# Patient Record
Sex: Female | Born: 1935
Health system: Southern US, Community
[De-identification: ages and names within clinical notes are randomized; demographics above are authoritative.]

## PROBLEM LIST (undated history)

## (undated) DIAGNOSIS — Z9289 Personal history of other medical treatment: Secondary | ICD-10-CM

## (undated) DIAGNOSIS — J449 Chronic obstructive pulmonary disease, unspecified: Secondary | ICD-10-CM

## (undated) DIAGNOSIS — R0602 Shortness of breath: Secondary | ICD-10-CM

## (undated) DIAGNOSIS — K819 Cholecystitis, unspecified: Secondary | ICD-10-CM

## (undated) DIAGNOSIS — I495 Sick sinus syndrome: Secondary | ICD-10-CM

## (undated) DIAGNOSIS — I639 Cerebral infarction, unspecified: Secondary | ICD-10-CM

## (undated) DIAGNOSIS — I509 Heart failure, unspecified: Secondary | ICD-10-CM

## (undated) DIAGNOSIS — F0390 Unspecified dementia without behavioral disturbance: Secondary | ICD-10-CM

## (undated) DIAGNOSIS — I4891 Unspecified atrial fibrillation: Secondary | ICD-10-CM

## (undated) DIAGNOSIS — R079 Chest pain, unspecified: Secondary | ICD-10-CM

## (undated) DIAGNOSIS — I10 Essential (primary) hypertension: Secondary | ICD-10-CM

## (undated) DIAGNOSIS — E876 Hypokalemia: Secondary | ICD-10-CM

## (undated) DIAGNOSIS — Z86711 Personal history of pulmonary embolism: Secondary | ICD-10-CM

## (undated) DIAGNOSIS — R7303 Prediabetes: Secondary | ICD-10-CM

## (undated) DIAGNOSIS — E785 Hyperlipidemia, unspecified: Secondary | ICD-10-CM

## (undated) DIAGNOSIS — Z8659 Personal history of other mental and behavioral disorders: Secondary | ICD-10-CM

## (undated) HISTORY — PX: ROTATOR CUFF REPAIR: SHX139

## (undated) HISTORY — PX: ABDOMINAL HYSTERECTOMY: SHX81

## (undated) HISTORY — PX: BACK SURGERY: SHX140

## (undated) HISTORY — DX: Personal history of other mental and behavioral disorders: Z86.59

## (undated) HISTORY — DX: Hyperlipidemia, unspecified: E78.5

## (undated) HISTORY — DX: Personal history of pulmonary embolism: Z86.711

## (undated) HISTORY — DX: Cerebral infarction, unspecified: I63.9

## (undated) HISTORY — DX: Personal history of other medical treatment: Z92.89

## (undated) HISTORY — DX: Unspecified atrial fibrillation: I48.91

## (undated) HISTORY — PX: REPLACEMENT TOTAL KNEE: SUR1224

## (undated) HISTORY — DX: Chest pain, unspecified: R07.9

## (undated) HISTORY — DX: Hypokalemia: E87.6

## (undated) HISTORY — DX: Heart failure, unspecified: I50.9

---

## 1998-04-16 ENCOUNTER — Observation Stay (HOSPITAL_COMMUNITY): Admission: RE | Admit: 1998-04-16 | Discharge: 1998-04-17 | Payer: Self-pay | Admitting: Orthopedic Surgery

## 1998-10-26 ENCOUNTER — Ambulatory Visit (HOSPITAL_COMMUNITY): Admission: RE | Admit: 1998-10-26 | Discharge: 1998-10-26 | Payer: Self-pay | Admitting: Endocrinology

## 1998-10-26 ENCOUNTER — Encounter: Payer: Self-pay | Admitting: Endocrinology

## 2000-01-31 ENCOUNTER — Encounter: Payer: Self-pay | Admitting: Endocrinology

## 2000-01-31 ENCOUNTER — Encounter: Admission: RE | Admit: 2000-01-31 | Discharge: 2000-01-31 | Payer: Self-pay | Admitting: Endocrinology

## 2001-02-01 ENCOUNTER — Encounter: Admission: RE | Admit: 2001-02-01 | Discharge: 2001-02-01 | Payer: Self-pay | Admitting: Endocrinology

## 2001-02-01 ENCOUNTER — Encounter: Payer: Self-pay | Admitting: Endocrinology

## 2002-02-03 ENCOUNTER — Encounter: Admission: RE | Admit: 2002-02-03 | Discharge: 2002-02-03 | Payer: Self-pay | Admitting: Endocrinology

## 2002-02-03 ENCOUNTER — Encounter: Payer: Self-pay | Admitting: Endocrinology

## 2002-05-24 ENCOUNTER — Other Ambulatory Visit: Admission: RE | Admit: 2002-05-24 | Discharge: 2002-05-24 | Payer: Self-pay | Admitting: Endocrinology

## 2003-01-11 ENCOUNTER — Encounter: Payer: Self-pay | Admitting: Neurosurgery

## 2003-01-11 ENCOUNTER — Encounter: Admission: RE | Admit: 2003-01-11 | Discharge: 2003-01-11 | Payer: Self-pay | Admitting: Neurosurgery

## 2003-01-26 ENCOUNTER — Encounter: Payer: Self-pay | Admitting: Neurosurgery

## 2003-01-26 ENCOUNTER — Encounter: Admission: RE | Admit: 2003-01-26 | Discharge: 2003-01-26 | Payer: Self-pay | Admitting: Neurosurgery

## 2003-02-07 ENCOUNTER — Encounter: Admission: RE | Admit: 2003-02-07 | Discharge: 2003-02-07 | Payer: Self-pay | Admitting: Endocrinology

## 2003-02-07 ENCOUNTER — Encounter: Payer: Self-pay | Admitting: Endocrinology

## 2003-08-08 ENCOUNTER — Encounter (HOSPITAL_COMMUNITY): Admission: RE | Admit: 2003-08-08 | Discharge: 2003-08-09 | Payer: Self-pay | Admitting: Endocrinology

## 2003-08-31 ENCOUNTER — Ambulatory Visit (HOSPITAL_COMMUNITY): Admission: RE | Admit: 2003-08-31 | Discharge: 2003-08-31 | Payer: Self-pay | Admitting: *Deleted

## 2003-09-28 ENCOUNTER — Ambulatory Visit (HOSPITAL_COMMUNITY): Admission: RE | Admit: 2003-09-28 | Discharge: 2003-09-28 | Payer: Self-pay | Admitting: *Deleted

## 2003-11-17 ENCOUNTER — Ambulatory Visit (HOSPITAL_COMMUNITY): Admission: RE | Admit: 2003-11-17 | Discharge: 2003-11-17 | Payer: Self-pay

## 2004-02-09 ENCOUNTER — Encounter: Admission: RE | Admit: 2004-02-09 | Discharge: 2004-02-09 | Payer: Self-pay | Admitting: Endocrinology

## 2004-09-17 ENCOUNTER — Encounter: Admission: RE | Admit: 2004-09-17 | Discharge: 2004-09-17 | Payer: Self-pay | Admitting: General Surgery

## 2004-09-19 ENCOUNTER — Ambulatory Visit (HOSPITAL_COMMUNITY): Admission: RE | Admit: 2004-09-19 | Discharge: 2004-09-19 | Payer: Self-pay | Admitting: General Surgery

## 2004-09-19 ENCOUNTER — Ambulatory Visit (HOSPITAL_BASED_OUTPATIENT_CLINIC_OR_DEPARTMENT_OTHER): Admission: RE | Admit: 2004-09-19 | Discharge: 2004-09-19 | Payer: Self-pay | Admitting: General Surgery

## 2005-02-20 ENCOUNTER — Encounter: Admission: RE | Admit: 2005-02-20 | Discharge: 2005-02-20 | Payer: Self-pay | Admitting: Endocrinology

## 2005-03-24 ENCOUNTER — Encounter: Admission: RE | Admit: 2005-03-24 | Discharge: 2005-03-24 | Payer: Self-pay

## 2005-11-27 ENCOUNTER — Encounter: Admission: RE | Admit: 2005-11-27 | Discharge: 2005-11-27 | Payer: Self-pay

## 2005-12-16 ENCOUNTER — Encounter: Admission: RE | Admit: 2005-12-16 | Discharge: 2005-12-16 | Payer: Self-pay

## 2006-01-09 ENCOUNTER — Encounter: Admission: RE | Admit: 2006-01-09 | Discharge: 2006-01-09 | Payer: Self-pay

## 2006-03-04 ENCOUNTER — Encounter: Admission: RE | Admit: 2006-03-04 | Discharge: 2006-03-04 | Payer: Self-pay | Admitting: Endocrinology

## 2007-01-29 ENCOUNTER — Emergency Department (HOSPITAL_COMMUNITY): Admission: EM | Admit: 2007-01-29 | Discharge: 2007-01-30 | Payer: Self-pay | Admitting: Emergency Medicine

## 2007-02-28 ENCOUNTER — Inpatient Hospital Stay (HOSPITAL_COMMUNITY): Admission: EM | Admit: 2007-02-28 | Discharge: 2007-03-02 | Payer: Self-pay | Admitting: Emergency Medicine

## 2007-03-08 ENCOUNTER — Encounter: Admission: RE | Admit: 2007-03-08 | Discharge: 2007-03-08 | Payer: Self-pay | Admitting: Endocrinology

## 2007-04-02 ENCOUNTER — Inpatient Hospital Stay (HOSPITAL_COMMUNITY): Admission: RE | Admit: 2007-04-02 | Discharge: 2007-04-06 | Payer: Self-pay | Admitting: Orthopaedic Surgery

## 2008-03-08 ENCOUNTER — Encounter: Admission: RE | Admit: 2008-03-08 | Discharge: 2008-03-08 | Payer: Self-pay | Admitting: Endocrinology

## 2008-07-02 ENCOUNTER — Emergency Department (HOSPITAL_COMMUNITY): Admission: EM | Admit: 2008-07-02 | Discharge: 2008-07-03 | Payer: Self-pay | Admitting: Emergency Medicine

## 2008-08-01 ENCOUNTER — Encounter (INDEPENDENT_AMBULATORY_CARE_PROVIDER_SITE_OTHER): Payer: Self-pay | Admitting: *Deleted

## 2008-08-01 ENCOUNTER — Ambulatory Visit (HOSPITAL_COMMUNITY): Admission: RE | Admit: 2008-08-01 | Discharge: 2008-08-01 | Payer: Self-pay | Admitting: *Deleted

## 2008-10-20 ENCOUNTER — Ambulatory Visit (HOSPITAL_COMMUNITY): Admission: RE | Admit: 2008-10-20 | Discharge: 2008-10-20 | Payer: Self-pay | Admitting: *Deleted

## 2008-10-20 ENCOUNTER — Encounter (INDEPENDENT_AMBULATORY_CARE_PROVIDER_SITE_OTHER): Payer: Self-pay | Admitting: *Deleted

## 2009-01-03 ENCOUNTER — Emergency Department (HOSPITAL_COMMUNITY): Admission: EM | Admit: 2009-01-03 | Discharge: 2009-01-03 | Payer: Self-pay | Admitting: Emergency Medicine

## 2010-11-11 ENCOUNTER — Other Ambulatory Visit: Payer: Self-pay | Admitting: Gastroenterology

## 2010-11-20 ENCOUNTER — Other Ambulatory Visit: Payer: Self-pay | Admitting: Gastroenterology

## 2010-11-20 DIAGNOSIS — D649 Anemia, unspecified: Secondary | ICD-10-CM

## 2010-11-28 ENCOUNTER — Other Ambulatory Visit: Payer: Self-pay

## 2010-12-13 ENCOUNTER — Ambulatory Visit
Admission: RE | Admit: 2010-12-13 | Discharge: 2010-12-13 | Disposition: A | Discharge status: Home / Self Care | Payer: MEDICARE | Source: Ambulatory Visit | Attending: Gastroenterology | Admitting: Gastroenterology

## 2010-12-13 DIAGNOSIS — D649 Anemia, unspecified: Secondary | ICD-10-CM

## 2011-02-04 NOTE — H&P (Signed)
NAMESORIAH, LEEMAN                    ACCOUNT NO.:  [id number]   MEDICAL RECORD NO.:  [id number]          PATIENT TYPE:  INP   LOCATION:  1830                         FACILITY:  [facility]   PHYSICIAN:  Ulyses Amor, MD DATE OF BIRTH:  04-29-1928   DATE OF ADMISSION:  02/28/2007  DATE OF DISCHARGE:                              HISTORY & PHYSICAL   Raven Gray is a 77 year old white woman who was admitted to [facility] for further evaluation of chest pain.   The patient, who has no past history of cardiac disease, presented to  the emergency department with a 2-day history of continuous chest pain.  The chest pain is described as a vague, mild tightness across her chest.  It radiates to the back of her neck.  It has been associated with  dyspnea, diaphoresis, and nausea. The chest discomfort appears to be  improved by deep inspiration; there were no other exacerbating or  ameliorating factors.  It appears not to be related to position,  activity, meals, or respiration.  It has subsided somewhat, though has  not resolved, since her arrival in the emergency department.   As noted, the patient has no past history of cardiac disease including  no history of chest pain, myocardial infarction, coronary artery  disease, congestive heart failure, or arrhythmias.  Her risk factors for  coronary artery disease include hypertension and family history.  She  has no history of diabetes mellitus, smoking, or dyslipidemia.   PAST MEDICAL HISTORY:  Notable otherwise only for depression.   MEDICATIONS:  Actonel, Effexor, and triamterene/hydrochlorothiazide.   ALLERGIES:  PREDNISONE.   OPERATIONS:  Neck, back, right shoulder.   SOCIAL HISTORY:  The patient lives with her husband. She does not work.  She does not smoke cigarettes. She does not drink alcohol.   FAMILY HISTORY:  Notable for coronary artery disease.   REVIEW OF SYSTEMS:  Reveals no problems related to her head, eyes, ears,  nose, mouth, throat, lungs, gastrointestinal system, genitourinary  system, extremities.  There is no history of neurologic or psychiatric  disorder.  There is no history of fever, chills, or weight loss.   PHYSICAL EXAMINATION:  VITAL SIGNS:  Blood pressure 150/73, pulse 69 and  regular, respirations 18, temperature 97.4.  GENERAL:  The patient is an elderly white woman in no discomfort. She  was alert, oriented, appropriate, and responsive.  HEAD, EYES, NOSE, AND MOUTH:  Normal.  NECK:  Without thyromegaly or adenopathy.  Carotid pulses were palpable  bilaterally and without bruits.  CARDIAC:  Examination revealed a normal S1 and S2. There was no S3, S4,  murmur, rub, or click.  Cardiac rhythm was regular.  CHEST:  Palpation of the sternum reproduced the patient's chest pain.  LUNGS:  Clear.  ABDOMEN:  Soft and nontender. There was no mass, hepatosplenomegaly,  bruit, distention, rebound, guarding, or rigidity.  Bowel sounds were  normal.  BREASTS, PELVIC, RECTAL:  Examinations were not performed as they were  not pertinent to the reason for acute care hospitalization.  EXTREMITIES:  Without edema,  deviation, or deformity.  Radial and  dorsalis pedis pulses were palpable bilaterally.  NEUROLOGIC:  Brief screening neurologic survey was unremarkable.   Electrocardiogram revealed normal sinus rhythm with a mildly prolonged  PR interval. There was slight ST segment depression in lead II, V4, and  V5.   The chest radiograph and chest CT, according to the radiologist, were  normal.  The initial set of cardiac markers revealed a myoglobin of  80.4, CK-MB less than 1.0, and troponin less than 0.05.  The second set  of cardiac markers revealed a myoglobin of 59.2, CK-MB less than 1.0,  and troponin less than 0.05.  Fibrin derivatives were 0.62.  Potassium  3.0, BUN 15, and creatinine 0.9.  White count was 5.7 with a hemoglobin  of 12.7 and hematocrit of 38.5.  The remaining studies were  pending at  the time of this dictation.   IMPRESSION:  1. Chest pain; rule out unstable angina.  The possibility of a      musculoskeletal etiology is suggested by reproduction of the chest      discomfort by palpation of the sternum.  Also noted is its      improvement by deep inspiration.  The chest radiograph and chest CT      are normal, and the SAO2 is 98% on room air.  2. Hypertension.  3. Depression.   PLAN:  1. Telemetry.  2. Serial cardiac enzymes.  3. Aspirin.  4. Intravenous heparin.  5. Intravenous nitroglycerin.  6. Fasting lipid profile.  7. Further measures per Dr. Jenne Campus.      Ulyses Amor, MD  Electronically Signed     MSC/MEDQ  D:  02/28/2007  T:  02/28/2007  Job:  [id number]   cc:   Darlin Priestly, MD

## 2011-02-04 NOTE — Op Note (Signed)
NAMEMAKENSEY, REGO                    ACCOUNT NO.:  [id number]   MEDICAL RECORD NO.:  [id number]          PATIENT TYPE:  AMB   LOCATION:  ENDO                         FACILITY:  [facility]   PHYSICIAN:  Georgiana Spinner, M.D.    DATE OF BIRTH:  1928-03-28   DATE OF PROCEDURE:  DATE OF DISCHARGE:                               OPERATIVE REPORT   PROCEDURE:  Upper endoscopy with biopsy.   INDICATIONS:  Abdominal pain.   ANESTHESIA:  Fentanyl 50 mcg, Versed 5 mg.   PROCEDURE:  With the patient mildly sedated in the left lateral  decubitus position, the Pentax videoscopic endoscope was inserted in the  mouth, passed under direct vision through the esophagus, which appeared  normal on first view as we entered into the stomach through a hiatal  hernia.  The fundus, body, antrum, duodenal bulb, second portion of the  duodenum were visualized.  From this point the endoscope was slowly  withdrawn, taking circumferential views of the duodenal mucosa until the  endoscope had been pulled back into the stomach and placed in  retroflexion to view the stomach from below and Barrett esophagus was  seen, photographed and biopsied.  The endoscope was straightened and  withdrawn, taking circumferential views in the remaining gastric and  esophageal mucosa, stopping in the fundus of the stomach, which appeared  to be in the hiatal hernia sac and areas of erythema possibly ulcer  based were photographed and biopsied.  The endoscope was withdrawn,  taking circumferential views of the remaining gastric and esophageal  mucosa.  The patient's vital signs, pulse oximeter remained stable.  The  patient tolerated the procedure well without apparent complications.   FINDINGS:  Question of linear ulcers in the stomach with surrounding  erythematous changes and question of Barrett  esophagus.  Await biopsy  reports.  The patient will call me for the results and follow up with me  as an outpatient.  Will increase PPI dose  to b.i.d.           ______________________________  Georgiana Spinner, M.D.     GMO/MEDQ  D:  08/01/2008  T:  08/01/2008  Job:  [id number]

## 2011-02-04 NOTE — Op Note (Signed)
NAMEBURNADETTE, Raven Gray                    ACCOUNT NO.:  [id number]   MEDICAL RECORD NO.:  [id number]          PATIENT TYPE:  INP   LOCATION:  5023                         FACILITY:  [facility]   PHYSICIAN:  Mark C. Ophelia Charter, M.D.    DATE OF BIRTH:  09-Jul-1928   DATE OF PROCEDURE:  04/02/2007  DATE OF DISCHARGE:                               OPERATIVE REPORT   PRE-AND-POSTOPERATIVE DIAGNOSIS:  Right knee osteoarthritis.   PROCEDURE:  Right total knee arthroplasty.   SURGEON:  Mark C. Ophelia Charter, M.D.   ASSISTANT:  Wende Neighbors, P.A.-C   ANESTHESIA:  GOT plus Marcaine local.   DESCRIPTION OF PROCEDURE:  After induction of general anesthesia  orotracheal intubation with preoperative femoral block placed by the  anesthesia team; standard DuraPrep was used up to the proximal thigh  tourniquet, impervious stockinette, Coban, and sterile skin marker, and  Betadine vidrape was used.  Leg was wrapped in an Esmarch prior to  tourniquet inflation.   Midline incision was made, patella was flipped over and cut removing 9.5  mm of bone with the oscillating saw.  Spurs were removed from the femur.  This was a valgus knee with flexion contracture valgus of 15 degrees and  a 12-degree flexion contracture.  Bicortical pins were placed in the  femur and the tibia; and did well until near the end of the case when  the femoral pins began to loosen despite their good bicortical  placement.  Initialization of tibial model followed by the femoral model  was made.   Femur was cut first taking 10 mm off of bone.  Sizing was 2.5 which was  appropriate.  Tibia was a size 3.  Chamfer cuts made on the femur, box  cut was made.  Distal remnants were excised.  There was laxity of the  medial side and medial collateral ligament was intact; however, it was  stretched out some from the long-term valgus position of the knee.  Posterior capsule was released off of the back of the femur; and large  bone spurs were removed off  the back to the femur with a 3/4-osteotome.  Initial cut on the tibia, taking 10 of bone was performed.  I then had  to be back down to take an initial 2 more mm to catch the scooped out  area, postero-lateral on the tibia where there was wear; this gave a  flush cut.  I was just catching a piece of the top of the fibula.   Carolin Guernsey was made; trials were inserted.  There was still some tightness  posteriorly.  There was good flexion/extension balance, slightly more  laxity medial from the long-term valgus; no lateral release was  necessary; but some more bone was removed off of the posterior aspect of  the femur, and stripping of the capsule which allowed full extension.  There was good symmetry on flexion/extension after irrigation with the  saline solution and pulsatile lavage.   Vacuum mixing of the cement, tibia was cemented first followed by femur,  and then the 10-mm spacer.  This was  a Geneticist, molecular.  All poly patella 35 mm was used.  After cement was hard at 15 minutes,  the tourniquet was deflated; hemostasis obtained; and then standard  layer closure.  Deep retinaculum with nonabsorbable Tycron; 2-0 in the  subcutaneous tissue, superficial retinaculum; then the skin closure with  Marcaine infiltration.  Postop dressing, knee immobilizer.  Instrument  count and needle count were correct.   SUMMARY:  Status  drains      Mark C. Ophelia Charter, M.D.  Electronically Signed     MCY/MEDQ  D:  04/02/2007  T:  04/03/2007  Job:  [id number]

## 2011-02-04 NOTE — Discharge Summary (Signed)
Raven Gray, Raven Gray                    ACCOUNT NO.:  [id number]   MEDICAL RECORD NO.:  [id number]          PATIENT TYPE:  INP   LOCATION:  3741                         FACILITY:  [facility]   PHYSICIAN:  Darlin Priestly, MD  DATE OF BIRTH:  1928-08-01   DATE OF ADMISSION:  02/28/2007  DATE OF DISCHARGE:  03/02/2007                               DISCHARGE SUMMARY   DISCHARGE DIAGNOSES:  1. Chest pain, negative for myocardial infarction.      a.     Negative pulmonary embolus.      b.     Negative aortic dissection.      c.     Normal coronary arteries was normal left ventricular       function.  2. Hypertension, controlled.  3. History of depression.  4. Hyperlipidemia.  Will treat with medication.  5. Large hiatal hernia on CT scan.  6. Hypokalemia, now improved with treatment.   DISCHARGE CONDITION:  Improved.   PROCEDURES:  March 01, 2007:  Combined left heart catheterization by Dr.  Nanetta Batty with normal coronary arteries, normal LV function.  Her  aortic root was generous but no aneurysm on CT of the chest.  Normal  renal arteries.  Lower extremity arteries are within normal limits as  well.   DISCHARGE MEDICATIONS:  1. Effexor 150 mg daily.  2. Triamterene/hydrochlorothiazide 37.5/25 daily.  3. Toprol-XL 25 mg daily.  4. Prilosec 20 mg one twice a day.  5. Actonel as before.   DISCHARGE INSTRUCTIONS:  1. May walk up steps.  May shower or bathe.  2. No lifting for 2 days.  3. No driving for 2 days.  4. Wash right groin catheterization site with soap and water.  Call if      any bleeding, swelling or drainage.  5. Follow up with Dr. Allyson Sabal Friday, March 19, 2007, at 10:45 a.m.   HISTORY OF PRESENT ILLNESS:  A 77 year old female who presented to the  emergency room at [facility] and was seen and evaluated by Dr. Gladys Damme  on-call for Dr. Jenne Campus and Dr. Allyson Sabal.  She had no previous cardiac  history.  She did have a remote Cardiolite that had been negative.  She  had chest  pain described as vague, mild tightness across her chest,  radiating to the back of her neck.  It had been associated with dyspnea,  diaphoresis and nausea.  It did improve with deep inspiration and it was  not related to position.   PAST MEDICAL HISTORY:  Positive for depression and hypertension.   OUTPATIENT MEDICATIONS:  As stated, Actonel, Effexor, triamterine.   ALLERGIES:  To PREDNISONE.   History of surgery on her neck, back and right shoulder.   FAMILY HISTORY, SOCIAL HISTORY, REVIEW OF SYSTEMS:  See H&P.   PHYSICAL EXAMINATION AT DISCHARGE:  VITAL SIGNS:  Blood pressure 113/70,  pulse 69, respiratory 20, temperature 97.  Oxygen saturation on room air  94%.  HEART:  Regular rate and rhythm.  LUNGS:  Clear and soft.  EXTREMITIES:  Groin stable.  No hematoma.  LABORATORY DATA:  Hemoglobin 12.7, hematocrit 38.5, WBC 5.7, platelets  273.  Slight decrease in hemoglobin to 11.5 and hematocrit 35.3, but  stable.  Neutrophils 63, lymphs 27, mono 8, eos 2, basos 1.  INR was  0.9, PTT 28.  D-dimer was 0.62 resulting in CT of chest which was  negative for PE, on heparin she was therapeutic.   Chemistry:  Sodium 138, potassium 3, chloride 101, CO2 28, glucose 101,  BUN 15, creatinine 0.84, total protein 6.1, albumin 3.3, AST 20, ALT 11,  ALP 65, total bili 0.6, magnesium 1.8.  Potassium at discharge was 3.6,  BUN 5, creatinine 0.80, and glucose 115, SGOT 20, SGPT 11.   CK 55 to 56, MB 1.7 and 1.8, troponin I 0.04 to 0.03, negative for MI.   Cholesterol 191, triglycerides 119, HDL 38 and LDL 129.   EKG:  Sinus rhythm, nonspecific ST changes when compared to previous  tracings.  CT of her chest as stated.  Chest x-ray negative for acute  cardiopulmonary process.   HOSPITAL COURSE:  The patient was admitted by Dr. Effie Shy on February 28, 2007, with chest pain.  She was placed on IV heparin, nitroglycerin.  Her potassium that was low was replaced.  She underwent cardiac   catheterization on March 01, 2007, with normal coronaries and normal LFT.  By March 02, 2007, she was stable, ambulating the hall without problem.  Her CT of her chest showed no aneurysm and no PE.  She does have a large  hiatal hernia.  Therefore, we are leaving her on Prilosec 20 mg twice a  day for now.  She will follow up with Dr. Juleen China as instructed, as well  as Dr. Allyson Sabal.      Darcella Gasman. Valarie Merino      Darlin Priestly, MD  Electronically Signed   LRI/MEDQ  D:  03/02/2007  T:  03/02/2007  Job:  [id number]   cc:   Nanetta Batty, M.D.  Brooke Bonito, M.D.

## 2011-02-04 NOTE — Op Note (Signed)
NAMEANAISSA, Raven Gray                    ACCOUNT NO.:  [id number]   MEDICAL RECORD NO.:  [id number]          PATIENT TYPE:  AMB   LOCATION:  ENDO                         FACILITY:  [facility]   PHYSICIAN:  Georgiana Spinner, M.D.    DATE OF BIRTH:  10-09-1927   DATE OF PROCEDURE:  10/20/2008  DATE OF DISCHARGE:                               OPERATIVE REPORT   PROCEDURE:  Upper endoscopy with biopsy.   INDICATIONS:  Stomach ulcers, abdominal pain.   ANESTHESIA:  Fentanyl 50 mcg, Versed 4 mg.   DESCRIPTION OF PROCEDURE:  With the patient mildly sedated in the left  lateral decubitus position, the Pentax videoscopic endoscope was  inserted in the mouth, passed under direct vision through the esophagus  which appeared normal into what appeared to be a pouch of the stomach,  possibly a hernia, but we were able to advance this superiorly and get  into the body and fundus of the stomach and advance to the antrum which  appeared mildly erythematous which was photographed and biopsied.  Duodenal bulb and second portion of duodenum were visualized and  appeared normal.  From this point the endoscope was slowly withdrawn  taking circumferential views of duodenal mucosa until the endoscope had  been pulled back into stomach, placed in retroflexion to view the  stomach from below.  The endoscope was straightened and withdrawn taking  circumferential views of remaining gastric and esophageal mucosa.  The  patient's vital signs and pulse oximeter remained stable.  The patient  tolerated the procedure well without apparent complication.   FINDINGS:  1. Loose wrap of the gastroesophageal junction around the endoscope      indicating laxity of the lower esophageal sphincter.  2. Erythema of antrum biopsied.  Await biopsy report.  The patient      will call me for results and follow-up with me as an outpatient.           ______________________________  Georgiana Spinner, M.D.     GMO/MEDQ  D:  10/20/2008  T:   10/20/2008  Job:  [id number]

## 2011-02-04 NOTE — Cardiovascular Report (Signed)
Raven Gray, Raven Gray                    ACCOUNT NO.:  [id number]   MEDICAL RECORD NO.:  [id number]          PATIENT TYPE:  INP   LOCATION:  3741                         FACILITY:  [facility]   PHYSICIAN:  Nanetta Batty, M.D.   DATE OF BIRTH:  1928/07/13   DATE OF PROCEDURE:  03/01/2007  DATE OF DISCHARGE:                            CARDIAC CATHETERIZATION   Raven Gray is a delightful 77 year old female whose husband is a patient of  Dr. Lenise Herald.  She was admitted last night with unstable angina.  She ruled out for myocardial infarction.  She presents now for  diagnostic coronary arteriography to define her anatomy and rule out an  ischemic etiology.   PROCEDURE DESCRIPTION:  The patient was brought to the second floor  [place] cardiac cath lab in the postabsorptive state.  She was  premedicated with p.o. Valium.  The right groin was prepped and shaved  in the usual sterile fashion.  One percent Xylocaine was used for local  anesthesia.  A 6-French sheath was inserted into the right femoral  artery using the standard Seldinger technique.  6-French right and left  Judkins diagnostic catheters, as well as a pigtail catheter, were used  for selective cholangiography, left ventriculography, supravalvular  aortography and distal abdominal aortography.  Visipaque dye was used  throughout the entirety of the case.  Aortic, ventricular and pulmonary  pressures were recorded.   HEMODYNAMICS:  1. Aortic systolic pressure 212, diastolic pressure 97.  2. Left ventricular systolic pressure 202, end-diastolic pressure 15.   SELECTIVE CORONARY ANGIOGRAPHY:  1. Left main normal.  2. LAD normal.  3. Left circumflex was dominant normal.  4. Right coronary artery was small, nondominant, normal.   LEFT VENTRICULOGRAPHY:  RAO left ventriculogram was performed using 25  cc of Visipaque dye at 12 cc per second.  The overall LVEF was estimated  at greater than 50% without focal wall motion  abnormalities.   SUPRAVALVULAR AORTOGRAPHY:  Performed in the LAO view using 25 cc of  Visipaque dye at 20 cc per second.  The aortic root seemed generous in  caliber.  There was no dissection or AI noted.   DISTAL ABDOMINAL AORTOGRAPHY:  Distal abdominal aortogram was performed  using of 25 cc of Visipaque dye at 20 cc per second.  The renal arteries  appeared widely patent.  The infrarenal abdominal aorta and iliac  bifurcation appear free of atherosclerotic changes.   IMPRESSION:  Raven Gray has essentially normal coronary arteries with a  left dominant system and normal left ventricular function.  I am not  sure why her troponin went up to 0.62.  Aortic root seems somewhat  generous and I would recommend a CT scan with contrast to assess its  size.  The renal arteries are normal, suggesting her hypertension is  essential.  Continued medical therapy will be recommended.   ACT was measured and the sheath was removed.  Pressure was applied to  the groin to achieve hemostasis.  The patient left the lab in stable  condition.      Nanetta Batty,  M.D.  Electronically Signed     JB/MEDQ  D:  03/01/2007  T:  03/02/2007  Job:  [id number]   cc:   2nd Floor [place] [facility]  [facility]

## 2011-02-07 NOTE — Op Note (Signed)
Raven Gray, SCRIVENS                              ACCOUNT NO.:  [id number]   MEDICAL RECORD NO.:  [id number]                   PATIENT TYPE:  AMB   LOCATION:  ENDO                                 FACILITY:  [facility]   PHYSICIAN:  Georgiana Spinner, M.D.                 DATE OF BIRTH:  [birth year]   DATE OF PROCEDURE:  DATE OF DISCHARGE:                                 OPERATIVE REPORT   PROCEDURE:  Colonoscopy.   INDICATIONS:  Hemoccult positivity.   ANESTHESIA:  Demerol 30, Versed 3 mg.   PROCEDURE:  With the patient mildly sedated, in the left lateral decubitus  position the Olympus videoscopic colonoscope was inserted in the rectum,  passed under direct vision to the cecum, identified by ileocecal valve and  appendiceal orifice, both of which were photographed.  From this point the  colonoscope was slowly withdrawn taking circumferential views of the colonic  mucosa, stopping only in the rectum, which appeared normal on direct, showed  hemorrhoids on retroflexed view.  The endoscope was straightened, withdrawn.  The patient's vital signs, pulse oximeter remained stable.  The patient  tolerated the procedure well, without apparent complications.   FINDINGS:  Internal hemorrhoids, otherwise unremarkable colonoscopic  examination to the cecum.   PLAN:  Have the patient follow up with me as an outpatient.                                               Georgiana Spinner, M.D.    GMO/MEDQ  D:  08/31/2003  T:  08/31/2003  Job:  [id number]

## 2011-02-07 NOTE — Discharge Summary (Signed)
Raven Gray, Raven Gray                    ACCOUNT NO.:  [id number]   MEDICAL RECORD NO.:  [id number]          PATIENT TYPE:  INP   LOCATION:  5023                         FACILITY:  [facility]   PHYSICIAN:  Mark C. Ophelia Charter, M.D.    DATE OF BIRTH:  October 22, 1927   DATE OF ADMISSION:  04/02/2007  DATE OF DISCHARGE:  04/06/2007                               DISCHARGE SUMMARY   ADMISSION DIAGNOSES:  1. Right knee osteoarthritis.  2. Rheumatoid arthritis.  3. Glaucoma.  4. Hypertension.  5. Osteoporosis.  6. Anxiety and depression  7. Status post lumbar decompression 2007.  8. Status post right rotator cuff repair 2004.   DISCHARGE DIAGNOSES:  1. Right knee osteoarthritis.  2. Rheumatoid arthritis.  3. Glaucoma.  4. Hypertension.  5. Osteoporosis.  6. Anxiety and depression  7. Status post lumbar decompression 2007.  8. Status post right rotator cuff repair 2004.  9. Posthemorrhagic anemia.  10.Hypokalemia treated with oral supplementation and resolved.   PROCEDURE:  On April 02, 2007, the patient underwent right total knee  arthroplasty by Dr. Annell Greening under general anesthesia, assisted by  Maud Deed, PA-C.   CONSULTATIONS:  None.   BRIEF HISTORY:  The patient is a 77 year old female with chronic and  progressive right knee pain secondary to osteoarthritis.  She has had  conservative treatment with intra-articular steroid injections as well  as viscous supplementation.  She uses chronic narcotic pain medication.  Radiographs have shown end-stage osteoarthritis of the right knee with a  25-degree valgus deformity of the right knee.  It was felt she would  benefit from surgical intervention and was admitted for the procedure as  stated above.   BRIEF HOSPITAL COURSE:  The patient tolerated the procedure under  general anesthesia without complications.  Postoperatively,  neurovascular function of the lower extremities was noted to be intact.  Dressing changes were done daily and the  patient's wound was healing  well during the hospital stay.  The patient was started on the usual  physical therapy program for ambulation and gait training, range of  motion and stretching exercises.  CPM was utilized for passive range of  motion.  The patient advanced very quickly with physical therapy.  She  received occupational therapy for ADLs and tolerated this well also.  At  the time of discharge the patient was ambulating 200 feet.  Range of  motion of the knee was noted to be 90 degrees in seated position with  full extension.  The patient was started on Coumadin for DVT  prophylaxis.  Adjustments in Coumadin dose were made according to daily  pro times by the pharmacist.  The patient was taking a regular diet.  She was voiding and having bowel movements prior to discharge.  On April 06, 2007, she was discharged to her home in stable condition with  arrangements for home health physical therapy through [facility].   PERTINENT LABORATORY VALUES:  EKG on admission:  Sinus rhythm with first-  degree AV block and nonspecific ST abnormality, with no change when  compared to previous EKG of December 2005.  CBC on admission with  hemoglobin 12.2, hematocrit 37.7.  Prior to discharge, hemoglobin 10.5,  hematocrit 31.4.  INR at discharge is 2.2.  Chemistry studies on  admission with potassium 3.4.  Oral supplementation was given.  On July  13, potassium 3.4.  Urinalysis on admission with small leukocyte  esterase, few epithelial cells, 36 wbc's, and 0-2 rbc's.   PLAN:  The patient was discharged to her home.  Arrangements for home  health physical therapy for ambulation and gait training, weightbearing  as tolerated utilizing a walker, range of motion, stretching and  strengthening exercises of the lower extremities.  The patient will  change her dressing as needed.  She will be allowed to shower.  She will  follow up with Dr. Ophelia Charter in 7-10 days.   PRESCRIPTIONS AT  DISCHARGE:  1. Tylox one to two every 4-6 hours as needed for pain.  2. Iron supplementation daily for 3 weeks  3. Coumadin 1 mg daily.  4. She will resume medications as taken prior to admission.   All questions encouraged and answered.      Wende Neighbors, P.A.      Mark C. Ophelia Charter, M.D.  Electronically Signed    SMV/MEDQ  D:  05/07/2007  T:  05/08/2007  Job:  [id number]

## 2011-02-07 NOTE — Op Note (Signed)
NAMEHILARY, Raven Gray                    ACCOUNT NO.:  [id number]   MEDICAL RECORD NO.:  [id number]          PATIENT TYPE:  AMB   LOCATION:  DSC                          FACILITY:  [facility]   PHYSICIAN:  Gabrielle Dare. Janee Morn, M.D.DATE OF BIRTH:  1928/03/03   DATE OF PROCEDURE:  09/19/2004  DATE OF DISCHARGE:                                 OPERATIVE REPORT   REFERRING PHYSICIAN:  Brooke Bonito, M.D.   PREOPERATIVE DIAGNOSIS:  Left inguinal hernia.   POSTOPERATIVE DIAGNOSIS:  Left inguinal hernia.   PROCEDURE:  Repair of left inguinal hernia with mesh.   SURGEON:  Gabrielle Dare. Janee Morn, M.D.   ANESTHESIA:  General/LMA.   ESTIMATED BLOOD LOSS:  Minimal.   HISTORY OF PRESENT ILLNESS:  The patient is a 77 year old white female who  was worked up for right groin pain with some flank pain as well.  CT scan  revealed kidney stones and also a left inguinal hernia.  She was treated for  her kidney stones and continued to have pain in her left inguinal region and  now presents for left inguinal hernia repair.   DESCRIPTION OF PROCEDURE:  Informed consent was obtained.  The patient  received intravenous antibiotics.  She was brought to the operating room and  general/LMA anesthesia was administered.  Her abdomen and left groin were  prepped and draped in a sterile fashion.  A left groin incision was made.  Subcutaneous tissues were dissected down through Scarpa's fascia revealing  the external oblique.  External oblique was a bit attenuated here with an  obvious bulge from the hernia.  The external oblique was opened while  protecting the hernia beneath and this opening was continued down through  the external ring.  The superior leaflet of the external oblique was bluntly  dissected from the transversalis and the inferior oblique was bluntly freed  up from the hernia revealing the shelving edge of the inguinal ligament.  Subsequently, the hernia was exposed.  This was circumferentially dissected  to  facilitate reducing it back into the abdomen.  It was a lateral direct  hernia.  Once it was mobilized, it easily reduced back into the abdomen.  Subsequently the ligament structures analogous to the cord were divided.  Good hemostasis was obtained.  The hernia was then repaired using a  polypropylene mesh cut into a bullet shape.  This was sutured to the tissues  above the pubic tubercle medially and in a running fashion along the  shelving edge of the inguinal ligament.  It was then tacked down to the  aponeurosis superiorly first starting at the permanent tissues over the  pubic tubercle and then along the aponeurosis in interrupted fashion with 0  Prolene completing a nice repair of the hernia.  Some additional stitches  were placed more laterally with 0 Prolene down to the fascia to secure the  mesh well.  Once this was accomplished, the area was copiously irrigated.  Hemostasis was insured.  Some 0.25% Marcaine with epinephrine was injected  into the fascia and subcutaneous tissues in the subcuticular area.  The  external oblique was closed with running 3-0 Vicryl stitch.  Subcutaneous  tissues were again irrigated. Scarpa's fascia was reapproximated with a  series of interrupted 3-0 Vicryl sutures and the skin was closed with a  running 4-0 Monocryl  subcuticular stitch.  Sponge, needle and instrument counts were correct.  Benzoin, Steri-Strips and sterile dressing were applied.  The patient  tolerated the procedure well without apparent complications and was taken to  the recovery room in stable condition.       BET/MEDQ  D:  09/19/2004  T:  09/19/2004  Job:  [id number]   cc:   Brooke Bonito, M.D.  [address]  [place]  [place] [address]  Fax: [phone]

## 2011-02-07 NOTE — Op Note (Signed)
NAMEGORDIE, Raven Gray                              ACCOUNT NO.:  [id number]   MEDICAL RECORD NO.:  [id number]                   PATIENT TYPE:  AMB   LOCATION:  ENDO                                 FACILITY:  [facility]   PHYSICIAN:  Georgiana Spinner, M.D.                 DATE OF BIRTH:  [birth year]   DATE OF PROCEDURE:  08/31/2003  DATE OF DISCHARGE:                                 OPERATIVE REPORT   PROCEDURE:  Upper endoscopy.   INDICATIONS:  Hemoccult positivity.   ANESTHESIA:  1. Demerol 70.  2. Versed 7 mg.   PROCEDURE:  With patient mildly sedated in the left lateral decubitus  position, the Olympus videoscopic endoscope was inserted into the mouth,  passed under direct vision through the esophagus, which appeared normal,  into the stomach.  The fundus, body, antrum, duodenal bulb, second portion  of duodenum were entered and all appeared normal.  From this point the  endoscope was slowly withdrawn, taking circumferential views of duodenal  mucosa until the endoscope had been pulled back into the stomach.  Placed in  retroflexion and viewed the stomach from below.  The endoscope was  straightened and withdrawn, taking circumferential views of remaining  gastric and esophageal mucosa.  Patient's vital signs and pulse oximetry  remained stable.  Patient tolerated the procedure well with no apparent  complications.   FINDINGS:  Unremarkable examination.   PLAN:  Proceed to colonoscopy.                                               Georgiana Spinner, M.D.    GMO/MEDQ  D:  08/31/2003  T:  08/31/2003  Job:  [id number]

## 2011-03-14 ENCOUNTER — Other Ambulatory Visit: Payer: Self-pay | Admitting: Endocrinology

## 2011-04-25 ENCOUNTER — Other Ambulatory Visit: Payer: Self-pay | Admitting: Endocrinology

## 2011-04-25 DIAGNOSIS — Z1231 Encounter for screening mammogram for malignant neoplasm of breast: Secondary | ICD-10-CM

## 2011-05-20 ENCOUNTER — Ambulatory Visit: Payer: Medicare Other

## 2011-05-21 ENCOUNTER — Ambulatory Visit
Admission: RE | Admit: 2011-05-21 | Discharge: 2011-05-21 | Disposition: A | Discharge status: Home / Self Care | Payer: Medicare Other | Source: Ambulatory Visit | Attending: Endocrinology | Admitting: Endocrinology

## 2011-05-21 DIAGNOSIS — Z1231 Encounter for screening mammogram for malignant neoplasm of breast: Secondary | ICD-10-CM

## 2011-06-23 LAB — POCT I-STAT, CHEM 8
Creatinine, Ser: 1
Hemoglobin: 14.3
Potassium: 3.1 — ABNORMAL LOW
Sodium: 140

## 2011-06-23 LAB — COMPREHENSIVE METABOLIC PANEL
BUN: 13
CO2: 29
Calcium: 9.2
Creatinine, Ser: 0.85
GFR calc Af Amer: >60
GFR calc non Af Amer: >60
Glucose, Bld: 134 — ABNORMAL HIGH
Total Bilirubin: 0.6

## 2011-06-23 LAB — CBC
HCT: 40.9
Hemoglobin: 13.2
MCHC: 32.2
MCV: 85.5
RBC: 4.78

## 2011-06-23 LAB — URINALYSIS, ROUTINE W REFLEX MICROSCOPIC
Bilirubin Urine: NEGATIVE
Nitrite: NEGATIVE
Protein, ur: NEGATIVE
Specific Gravity, Urine: 1.017
Urobilinogen, UA: 1

## 2011-06-23 LAB — DIFFERENTIAL
Basophils Absolute: 0
Lymphocytes Relative: 15
Monocytes Absolute: 0.4
Monocytes Relative: 6
Neutro Abs: 5.6
Neutrophils Relative %: 78 — ABNORMAL HIGH

## 2011-06-23 LAB — LACTIC ACID, PLASMA: Lactic Acid, Venous: 1.6

## 2011-06-23 LAB — URINE MICROSCOPIC-ADD ON

## 2011-07-07 LAB — PROTIME-INR
INR: 2.2 — ABNORMAL HIGH
Prothrombin Time: 25.5 — ABNORMAL HIGH

## 2011-07-08 LAB — BASIC METABOLIC PANEL
BUN: 6
BUN: 6
CO2: 28
Calcium: 8.2 — ABNORMAL LOW
Chloride: 99
GFR calc non Af Amer: >60
Glucose, Bld: 121 — ABNORMAL HIGH
Glucose, Bld: 123 — ABNORMAL HIGH
Potassium: 3 — ABNORMAL LOW
Potassium: 3.4 — ABNORMAL LOW
Sodium: 135

## 2011-07-08 LAB — CBC
HCT: 30.3 — ABNORMAL LOW
HCT: 31.4 — ABNORMAL LOW
HCT: 37.7
MCHC: 33.2
MCHC: 33.3
MCV: 84
MCV: 85.1
Platelets: 197
Platelets: 205
Platelets: 214
Platelets: 266
RDW: 14.5 — ABNORMAL HIGH
RDW: 14.8 — ABNORMAL HIGH
RDW: 15.2 — ABNORMAL HIGH
WBC: 5.6
WBC: 6.1

## 2011-07-08 LAB — DIFFERENTIAL
Basophils Absolute: 0
Basophils Relative: 1
Eosinophils Absolute: 0.2
Eosinophils Relative: 3
Monocytes Absolute: 0.5
Monocytes Relative: 9

## 2011-07-08 LAB — COMPREHENSIVE METABOLIC PANEL
ALT: 10
AST: 17
Albumin: 3.7
Alkaline Phosphatase: 79
BUN: 13
Chloride: 99
GFR calc Af Amer: >60
Potassium: 3.4 — ABNORMAL LOW
Sodium: 140
Total Bilirubin: 0.5
Total Protein: 6.7

## 2011-07-08 LAB — URINALYSIS, ROUTINE W REFLEX MICROSCOPIC
Bilirubin Urine: NEGATIVE
Glucose, UA: NEGATIVE
Ketones, ur: NEGATIVE
Nitrite: NEGATIVE
Specific Gravity, Urine: 1.011
pH: 7.5

## 2011-07-08 LAB — URINE MICROSCOPIC-ADD ON

## 2011-07-08 LAB — APTT: aPTT: 27

## 2011-07-08 LAB — PROTIME-INR
INR: 1.1
Prothrombin Time: 23.9 — ABNORMAL HIGH

## 2011-07-10 LAB — TSH: TSH: 1.881

## 2011-07-10 LAB — CBC
HCT: 35.3 — ABNORMAL LOW
HCT: 36.6
MCHC: 32.5
MCHC: 33
MCV: 84.8
MCV: 85.8
Platelets: 240
Platelets: 244
RDW: 13.9
RDW: 14
RDW: 14.1 — ABNORMAL HIGH
WBC: 5.3

## 2011-07-10 LAB — LIPID PANEL
Cholesterol: 191
HDL: 38 — ABNORMAL LOW
HDL: 44
LDL Cholesterol: 129 — ABNORMAL HIGH
LDL Cholesterol: 149 — ABNORMAL HIGH
Total CHOL/HDL Ratio: 5.2
Triglycerides: 119
Triglycerides: 168 — ABNORMAL HIGH
VLDL: 34

## 2011-07-10 LAB — I-STAT 8, (EC8 V) (CONVERTED LAB)
Acid-Base Excess: 5 — ABNORMAL HIGH
BUN: 15
Chloride: 101
HCT: 42
Hemoglobin: 14.3
Operator id: 196461
Potassium: 3 — ABNORMAL LOW
pCO2, Ven: 41.8 — ABNORMAL LOW

## 2011-07-10 LAB — COMPREHENSIVE METABOLIC PANEL
AST: 20
AST: 20
Albumin: 3.3 — ABNORMAL LOW
Albumin: 3.5
Alkaline Phosphatase: 65
BUN: 5 — ABNORMAL LOW
CO2: 28
Chloride: 101
Creatinine, Ser: 0.8
Creatinine, Ser: 0.84
GFR calc Af Amer: >60
GFR calc Af Amer: >60
GFR calc non Af Amer: >60
Potassium: 3 — ABNORMAL LOW
Potassium: 3.6
Total Bilirubin: 0.6
Total Protein: 6.3

## 2011-07-10 LAB — HEPARIN LEVEL (UNFRACTIONATED)
Heparin Unfractionated: 0.22 — ABNORMAL LOW
Heparin Unfractionated: 0.42

## 2011-07-10 LAB — CARDIAC PANEL(CRET KIN+CKTOT+MB+TROPI)
CK, MB: 1.7
Total CK: 55
Total CK: 56

## 2011-07-10 LAB — DIFFERENTIAL
Basophils Absolute: 0
Basophils Relative: 1
Eosinophils Absolute: 0.1
Monocytes Absolute: 0.5
Neutro Abs: 3.6
Neutrophils Relative %: 63

## 2011-07-10 LAB — BASIC METABOLIC PANEL
BUN: 6
Calcium: 9.3
Chloride: 101
Creatinine, Ser: 0.68
GFR calc Af Amer: >60

## 2011-07-10 LAB — POCT CARDIAC MARKERS
CKMB, poc: <1 — ABNORMAL LOW
Myoglobin, poc: 59.2
Myoglobin, poc: 60.4
Operator id: 196461
Troponin i, poc: <0.05

## 2011-07-10 LAB — POCT I-STAT CREATININE: Creatinine, Ser: 0.9

## 2011-07-10 LAB — PROTIME-INR: Prothrombin Time: 12.6

## 2011-07-10 LAB — B-NATRIURETIC PEPTIDE (CONVERTED LAB): Pro B Natriuretic peptide (BNP): 40

## 2011-07-10 LAB — CK TOTAL AND CKMB (NOT AT ARMC): CK, MB: 1.7

## 2011-07-10 LAB — APTT: aPTT: 28

## 2012-05-14 ENCOUNTER — Other Ambulatory Visit: Payer: Self-pay | Admitting: Endocrinology

## 2012-05-14 DIAGNOSIS — Z1231 Encounter for screening mammogram for malignant neoplasm of breast: Secondary | ICD-10-CM

## 2012-06-15 ENCOUNTER — Ambulatory Visit: Payer: Medicare Other

## 2012-06-17 ENCOUNTER — Other Ambulatory Visit: Payer: Self-pay | Admitting: Endocrinology

## 2012-06-17 DIAGNOSIS — R531 Weakness: Secondary | ICD-10-CM

## 2012-06-18 ENCOUNTER — Encounter (HOSPITAL_COMMUNITY): Payer: Self-pay | Admitting: Family Medicine

## 2012-06-18 ENCOUNTER — Emergency Department (HOSPITAL_COMMUNITY): Payer: Medicare Other

## 2012-06-18 ENCOUNTER — Inpatient Hospital Stay (HOSPITAL_COMMUNITY)
Admission: EM | Admit: 2012-06-18 | Discharge: 2012-06-22 | DRG: 176 | Disposition: A | Discharge status: Home / Self Care | Payer: Medicare Other | Attending: Internal Medicine | Admitting: Internal Medicine

## 2012-06-18 DIAGNOSIS — Z23 Encounter for immunization: Secondary | ICD-10-CM

## 2012-06-18 DIAGNOSIS — I2699 Other pulmonary embolism without acute cor pulmonale: Principal | ICD-10-CM

## 2012-06-18 DIAGNOSIS — R0902 Hypoxemia: Secondary | ICD-10-CM | POA: Diagnosis present

## 2012-06-18 DIAGNOSIS — I509 Heart failure, unspecified: Secondary | ICD-10-CM | POA: Diagnosis present

## 2012-06-18 DIAGNOSIS — J441 Chronic obstructive pulmonary disease with (acute) exacerbation: Secondary | ICD-10-CM | POA: Diagnosis present

## 2012-06-18 DIAGNOSIS — Z96659 Presence of unspecified artificial knee joint: Secondary | ICD-10-CM

## 2012-06-18 DIAGNOSIS — Z66 Do not resuscitate: Secondary | ICD-10-CM | POA: Diagnosis present

## 2012-06-18 DIAGNOSIS — J189 Pneumonia, unspecified organism: Secondary | ICD-10-CM | POA: Diagnosis present

## 2012-06-18 DIAGNOSIS — Z833 Family history of diabetes mellitus: Secondary | ICD-10-CM

## 2012-06-18 DIAGNOSIS — I10 Essential (primary) hypertension: Secondary | ICD-10-CM | POA: Diagnosis present

## 2012-06-18 DIAGNOSIS — Z6832 Body mass index (BMI) 32.0-32.9, adult: Secondary | ICD-10-CM

## 2012-06-18 DIAGNOSIS — E876 Hypokalemia: Secondary | ICD-10-CM | POA: Diagnosis present

## 2012-06-18 DIAGNOSIS — I5032 Chronic diastolic (congestive) heart failure: Secondary | ICD-10-CM | POA: Diagnosis present

## 2012-06-18 DIAGNOSIS — E669 Obesity, unspecified: Secondary | ICD-10-CM | POA: Diagnosis present

## 2012-06-18 HISTORY — DX: Essential (primary) hypertension: I10

## 2012-06-18 HISTORY — DX: Shortness of breath: R06.02

## 2012-06-18 HISTORY — DX: Chronic obstructive pulmonary disease, unspecified: J44.9

## 2012-06-18 LAB — CBC WITH DIFFERENTIAL/PLATELET
Basophils Absolute: 0 10*3/uL (ref 0.0–0.1)
Basophils Relative: 0 % (ref 0–1)
Lymphocytes Relative: 8 % — ABNORMAL LOW (ref 12–46)
MCHC: 33.4 g/dL (ref 30.0–36.0)
Monocytes Absolute: 0.8 10*3/uL (ref 0.1–1.0)
Neutro Abs: 8.7 10*3/uL — ABNORMAL HIGH (ref 1.7–7.7)
Neutrophils Relative %: 83 % — ABNORMAL HIGH (ref 43–77)
Platelets: 185 10*3/uL (ref 150–400)
RDW: 13.5 % (ref 11.5–15.5)
WBC: 10.5 10*3/uL (ref 4.0–10.5)

## 2012-06-18 LAB — COMPREHENSIVE METABOLIC PANEL
ALT: 23 U/L (ref 0–35)
AST: 21 U/L (ref 0–37)
Albumin: 3.1 g/dL — ABNORMAL LOW (ref 3.5–5.2)
Chloride: 88 mEq/L — ABNORMAL LOW (ref 96–112)
Creatinine, Ser: 1.14 mg/dL — ABNORMAL HIGH (ref 0.50–1.10)
Potassium: 2.8 mEq/L — ABNORMAL LOW (ref 3.5–5.1)
Sodium: 132 mEq/L — ABNORMAL LOW (ref 135–145)
Total Bilirubin: 0.7 mg/dL (ref 0.3–1.2)

## 2012-06-18 LAB — POCT I-STAT TROPONIN I

## 2012-06-18 MED ORDER — POTASSIUM CHLORIDE 20 MEQ/15ML (10%) PO LIQD
40.0000 meq | Freq: Once | ORAL | Status: AC
  Administered 2012-06-18: 40 meq via ORAL
  Filled 2012-06-18: qty 30

## 2012-06-18 MED ORDER — ALBUTEROL SULFATE (5 MG/ML) 0.5% IN NEBU
2.5000 mg | INHALATION_SOLUTION | RESPIRATORY_TRACT | Status: DC
  Administered 2012-06-18 – 2012-06-19 (×3): 2.5 mg via RESPIRATORY_TRACT
  Filled 2012-06-18 (×3): qty 0.5

## 2012-06-18 MED ORDER — POTASSIUM CHLORIDE 10 MEQ/100ML IV SOLN
10.0000 meq | Freq: Once | INTRAVENOUS | Status: AC
  Administered 2012-06-18: 10 meq via INTRAVENOUS
  Filled 2012-06-18: qty 100

## 2012-06-18 MED ORDER — IPRATROPIUM BROMIDE 0.02 % IN SOLN
0.5000 mg | RESPIRATORY_TRACT | Status: DC
  Administered 2012-06-18 – 2012-06-19 (×3): 0.5 mg via RESPIRATORY_TRACT
  Filled 2012-06-18 (×3): qty 2.5

## 2012-06-18 NOTE — ED Notes (Signed)
EKG completed and given to Dr. Bednar along with OLD ekg. 

## 2012-06-18 NOTE — ED Provider Notes (Signed)
History     CSN: [id number]  Arrival date & time 06/18/12  1625   First MD Initiated Contact with Patient 06/18/12 2007      Chief Complaint  Patient presents with  . Shortness of Breath    (Consider location/radiation/quality/duration/timing/severity/associated sxs/prior treatment) HPI    78 y.o. female in no acute distress accompanied by daughter with past medical history significant for CHF and COPD complaining of worsening DOE x7 days. Denies fever, CP, palpations, N/V, worsening peripheral edema. Peripheral edema has actually improved.  Endorses long-term orthopnea which not worsening significantly recently. Patient was seen by her PCP Dr. Juleen China approximately 3 weeks ago for similar symptoms and instructed to increase her Lasix pills to 2 times per day.   Past Medical History  Diagnosis Date  . Hypertension   . COPD (chronic obstructive pulmonary disease)     Past Surgical History  Procedure Date  . Replacement total knee     History reviewed. No pertinent family history.  History  Substance Use Topics  . Smoking status: Not on file  . Smokeless tobacco: Not on file  . Alcohol Use: No    OB History    Grav Para Term Preterm Abortions TAB SAB Ect Mult Living                  Review of Systems  Constitutional: Negative for fever.  Respiratory: Positive for shortness of breath.   Cardiovascular: Negative for chest pain and leg swelling.  Gastrointestinal: Negative for nausea, vomiting, abdominal pain and diarrhea.  All other systems reviewed and are negative.    Allergies  Prednisone  Home Medications   Current Outpatient Rx  Name Route Sig Dispense Refill  . BUPROPION HCL ER (XL) 150 MG PO TB24 Oral Take 150 mg by mouth daily.    . FUROSEMIDE 40 MG PO TABS Oral Take 40 mg by mouth daily.    . IBUPROFEN-DIPHENHYDRAMINE CIT 200-38 MG PO TABS Oral Take 1 tablet by mouth at bedtime as needed. For pain or sleep    . METOPROLOL TARTRATE 50 MG PO TABS  Oral Take 50 mg by mouth 2 (two) times daily.    Marland Kitchen MIRABEGRON ER 50 MG PO TB24 Oral Take 50 mg by mouth daily.    Marland Kitchen POTASSIUM CHLORIDE CRYS ER 20 MEQ PO TBCR Oral Take 20 mEq by mouth 2 (two) times daily.    Marland Kitchen PRAVASTATIN SODIUM 40 MG PO TABS Oral Take 40 mg by mouth every evening.     Marland Kitchen TIOTROPIUM BROMIDE MONOHYDRATE 18 MCG IN CAPS Inhalation Place 18 mcg into inhaler and inhale daily.    . VENLAFAXINE HCL ER 150 MG PO CP24 Oral Take 150 mg by mouth daily.      BP 125/71  Pulse 60  Temp 98.1 F (36.7 C) (Oral)  Resp 24  SpO2 98%  Physical Exam  Nursing note and vitals reviewed. Constitutional: She is oriented to person, place, and time. She appears well-developed and well-nourished. No distress.  HENT:  Head: Normocephalic.  Eyes: Conjunctivae normal and EOM are normal. Pupils are equal, round, and reactive to light. Right eye exhibits no discharge.  Neck: Normal range of motion. Neck supple. No JVD present.  Cardiovascular: Normal rate, regular rhythm, normal heart sounds and intact distal pulses.   Pulmonary/Chest: Effort normal and breath sounds normal. No stridor. No respiratory distress. She has no wheezes. She has no rales. She exhibits no tenderness.       No crackles or  wheezing.  Abdominal: Soft. Bowel sounds are normal. She exhibits no distension and no mass. There is no tenderness. There is no rebound and no guarding.  Musculoskeletal: Normal range of motion. She exhibits edema. She exhibits no tenderness.       Bilateral 2+ edema to lower shin  Neurological: She is alert and oriented to person, place, and time.  Skin: Skin is warm.  Psychiatric: She has a normal mood and affect.    ED Course  Procedures (including critical care time)  Labs Reviewed  CBC WITH DIFFERENTIAL - Abnormal; Notable for the following:    Neutrophils Relative 83 (*)     Neutro Abs 8.7 (*)     Lymphocytes Relative 8 (*)     All other components within normal limits  COMPREHENSIVE METABOLIC  PANEL - Abnormal; Notable for the following:    Sodium 132 (*)     Potassium 2.8 (*)     Chloride 88 (*)     Glucose, Bld 120 (*)     Creatinine, Ser 1.14 (*)     Albumin 3.1 (*)     GFR calc non Af Amer 44 (*)     GFR calc Af Amer 50 (*)     All other components within normal limits  PRO B NATRIURETIC PEPTIDE - Abnormal; Notable for the following:    Pro B Natriuretic peptide (BNP) 1553.0 (*)     All other components within normal limits  POCT I-STAT TROPONIN I  MAGNESIUM   Dg Chest 2 View  06/18/2012  *RADIOLOGY REPORT*  Clinical Data: Shortness of breath.  CHEST - 2 VIEW  Comparison: Chest x-ray 03/27/2010.  Findings: No acute consolidative airspace disease. Prominence of the interstitial markings has been noted on prior examinations, however, is increased on today's study, with a suggestion of some peripheral micronodularity.  No pleural effusions.  No evidence of pulmonary edema.  Heart size is borderline enlarged. The patient is rotated to the left on today's exam, resulting in distortion of the mediastinal contours and reduced diagnostic sensitivity and specificity for mediastinal pathology.  Atherosclerosis in the thoracic aorta.  Large hiatal hernia again noted.  IMPRESSION: 1.  Increased prominence of interstitial markings with suggestion of peripheral micronodularity throughout the lungs bilaterally. This is nonspecific, and could suggest a chronic indolent atypical infectious process such as MAI (Mycobacterium avium- intracellulare).  This could be better evaluated with non emergent chest CT if clinically indicated. 2.  Borderline cardiomegaly. 3.  Atherosclerosis. 4.  Large hiatal hernia again noted.   Original Report Authenticated By: Florencia Reasons, M.D.     Date: 06/18/2012  Rate: 60  Rhythm: normal sinus rhythm  QRS Axis: normal  Intervals: normal  ST/T Wave abnormalities: nonspecific ST/T changes  Conduction Disutrbances:none  Narrative Interpretation: Patient has ST  depression in inferior and lateral leads but this is unchanged from prior on 07/02/2008.  Old EKG Reviewed: unchanged    1. Hypokalemia   2. CHF (congestive heart failure)   3. Hypoxia       MDM  Hypoxia secondary to dDx: CHF, COPD, atypical pulmonary infection  Patient is saturating in the mid-80s on room air oxygen responsive increasing to 95% on nasal cannula at 4 L per minute.  Lungs sounds are clear with no crackles or wheezing.  Patient's EKG is nonischemic and unchanged from prior and troponin is normal. Patient is very hypokalemic at 2.8 likely from increase in Lasix dosage over the course of the last 3 weeks. We'll  replete her intravascularly and orally.   CXR shows prominent interstitial markings unchanged from prior but they do note a peripheral diffuse micro-nodularity this is read as a nonspecific finding but may suggest a chronic indolent atypical infection such as MAI. The chest x-ray is consistent with CHF without effusion with cardiomegaly.   BNP is elevated at 1553, there are no prior readings to compare this to.  Consult from hospitalist Dr. Adela Glimpse appreciated: She will come to evaluate the patient and admit her.      Wynetta Emery, PA-C 06/18/12 2328

## 2012-06-18 NOTE — ED Notes (Signed)
Increased SOB over 5 day more with exertion and some weakness. Last week K was low. Pt sats 95% 4L. Lungs clear. Edema to Bilateral ankles.

## 2012-06-19 ENCOUNTER — Inpatient Hospital Stay (HOSPITAL_COMMUNITY): Payer: Medicare Other

## 2012-06-19 ENCOUNTER — Encounter (HOSPITAL_COMMUNITY): Payer: Self-pay | Admitting: Internal Medicine

## 2012-06-19 DIAGNOSIS — I509 Heart failure, unspecified: Secondary | ICD-10-CM

## 2012-06-19 DIAGNOSIS — R0902 Hypoxemia: Secondary | ICD-10-CM

## 2012-06-19 DIAGNOSIS — I2699 Other pulmonary embolism without acute cor pulmonale: Secondary | ICD-10-CM

## 2012-06-19 DIAGNOSIS — J189 Pneumonia, unspecified organism: Secondary | ICD-10-CM | POA: Diagnosis present

## 2012-06-19 DIAGNOSIS — E876 Hypokalemia: Secondary | ICD-10-CM

## 2012-06-19 LAB — CBC
HCT: 37.3 % (ref 36.0–46.0)
Hemoglobin: 12.6 g/dL (ref 12.0–15.0)
MCH: 29.1 pg (ref 26.0–34.0)
MCHC: 33.8 g/dL (ref 30.0–36.0)
MCV: 86.1 fL (ref 78.0–100.0)
RDW: 13.6 % (ref 11.5–15.5)

## 2012-06-19 LAB — COMPREHENSIVE METABOLIC PANEL
ALT: 19 U/L (ref 0–35)
AST: 20 U/L (ref 0–37)
Alkaline Phosphatase: 93 U/L (ref 39–117)
CO2: 29 mEq/L (ref 19–32)
Calcium: 8 mg/dL — ABNORMAL LOW (ref 8.4–10.5)
GFR calc Af Amer: 60 mL/min — ABNORMAL LOW (ref >90)
Glucose, Bld: 168 mg/dL — ABNORMAL HIGH (ref 70–99)
Potassium: 3.1 mEq/L — ABNORMAL LOW (ref 3.5–5.1)
Sodium: 131 mEq/L — ABNORMAL LOW (ref 135–145)
Total Protein: 5.9 g/dL — ABNORMAL LOW (ref 6.0–8.3)

## 2012-06-19 LAB — PROTIME-INR: INR: 1.06 (ref 0.00–1.49)

## 2012-06-19 MED ORDER — SODIUM CHLORIDE 0.9 % IJ SOLN: 3.0000 mL | INTRAMUSCULAR | Status: DC | PRN

## 2012-06-19 MED ORDER — FUROSEMIDE 40 MG PO TABS
40.0000 mg | ORAL_TABLET | Freq: Every day | ORAL | Status: DC
  Administered 2012-06-19 – 2012-06-22 (×4): 40 mg via ORAL
  Filled 2012-06-19 (×4): qty 1

## 2012-06-19 MED ORDER — DEXTROSE 5 % IV SOLN
1.0000 g | INTRAVENOUS | Status: DC
  Filled 2012-06-19: qty 10

## 2012-06-19 MED ORDER — VENLAFAXINE HCL ER 150 MG PO CP24
150.0000 mg | ORAL_CAPSULE | Freq: Every day | ORAL | Status: DC
  Administered 2012-06-19 – 2012-06-22 (×4): 150 mg via ORAL
  Filled 2012-06-19 (×5): qty 1

## 2012-06-19 MED ORDER — SODIUM CHLORIDE 0.9 % IJ SOLN
3.0000 mL | Freq: Two times a day (BID) | INTRAMUSCULAR | Status: DC
  Administered 2012-06-19 – 2012-06-22 (×6): 3 mL via INTRAVENOUS

## 2012-06-19 MED ORDER — IOHEXOL 300 MG/ML  SOLN
60.0000 mL | Freq: Once | INTRAMUSCULAR | Status: AC | PRN
  Administered 2012-06-19: 60 mL via INTRAVENOUS

## 2012-06-19 MED ORDER — DEXTROSE 5 % IV SOLN
500.0000 mg | INTRAVENOUS | Status: DC
  Administered 2012-06-19 – 2012-06-20 (×2): 500 mg via INTRAVENOUS
  Filled 2012-06-19 (×2): qty 500

## 2012-06-19 MED ORDER — ALBUTEROL SULFATE (5 MG/ML) 0.5% IN NEBU: 2.5000 mg | INHALATION_SOLUTION | RESPIRATORY_TRACT | Status: DC | PRN

## 2012-06-19 MED ORDER — POTASSIUM CHLORIDE 10 MEQ/100ML IV SOLN
10.0000 meq | INTRAVENOUS | Status: AC
  Administered 2012-06-19 (×2): 10 meq via INTRAVENOUS
  Filled 2012-06-19 (×4): qty 100

## 2012-06-19 MED ORDER — INFLUENZA VIRUS VACC SPLIT PF IM SUSP
0.5000 mL | INTRAMUSCULAR | Status: AC
  Filled 2012-06-19 (×2): qty 0.5

## 2012-06-19 MED ORDER — ENOXAPARIN SODIUM 100 MG/ML ~~LOC~~ SOLN
85.0000 mg | Freq: Two times a day (BID) | SUBCUTANEOUS | Status: DC
  Administered 2012-06-19 – 2012-06-20 (×2): 85 mg via SUBCUTANEOUS
  Filled 2012-06-19 (×5): qty 1

## 2012-06-19 MED ORDER — HYDROCODONE-ACETAMINOPHEN 5-325 MG PO TABS
1.0000 | ORAL_TABLET | Freq: Four times a day (QID) | ORAL | Status: DC | PRN
  Administered 2012-06-19 – 2012-06-21 (×7): 1 via ORAL
  Filled 2012-06-19 (×8): qty 1

## 2012-06-19 MED ORDER — POTASSIUM CHLORIDE CRYS ER 20 MEQ PO TBCR: 40.0000 meq | EXTENDED_RELEASE_TABLET | Freq: Once | ORAL | Status: DC

## 2012-06-19 MED ORDER — POTASSIUM CHLORIDE CRYS ER 20 MEQ PO TBCR
20.0000 meq | EXTENDED_RELEASE_TABLET | Freq: Two times a day (BID) | ORAL | Status: DC
  Filled 2012-06-19 (×2): qty 1

## 2012-06-19 MED ORDER — ALBUTEROL SULFATE (5 MG/ML) 0.5% IN NEBU
2.5000 mg | INHALATION_SOLUTION | Freq: Three times a day (TID) | RESPIRATORY_TRACT | Status: DC
  Administered 2012-06-19 – 2012-06-22 (×8): 2.5 mg via RESPIRATORY_TRACT
  Filled 2012-06-19 (×8): qty 0.5

## 2012-06-19 MED ORDER — METOPROLOL TARTRATE 50 MG PO TABS
50.0000 mg | ORAL_TABLET | Freq: Two times a day (BID) | ORAL | Status: DC
  Administered 2012-06-19 – 2012-06-22 (×8): 50 mg via ORAL
  Filled 2012-06-19 (×9): qty 1

## 2012-06-19 MED ORDER — POTASSIUM CHLORIDE 10 MEQ/100ML IV SOLN
10.0000 meq | INTRAVENOUS | Status: AC
  Administered 2012-06-19 (×2): 10 meq via INTRAVENOUS
  Filled 2012-06-19 (×2): qty 100

## 2012-06-19 MED ORDER — PNEUMOCOCCAL VAC POLYVALENT 25 MCG/0.5ML IJ INJ
0.5000 mL | INJECTION | INTRAMUSCULAR | Status: AC
  Administered 2012-06-19: 0.5 mL via INTRAMUSCULAR
  Filled 2012-06-19: qty 0.5

## 2012-06-19 MED ORDER — MIRABEGRON ER 50 MG PO TB24
50.0000 mg | ORAL_TABLET | Freq: Every day | ORAL | Status: DC
  Administered 2012-06-19 – 2012-06-22 (×4): 50 mg via ORAL
  Filled 2012-06-19 (×4): qty 1

## 2012-06-19 MED ORDER — ENOXAPARIN SODIUM 40 MG/0.4ML ~~LOC~~ SOLN
40.0000 mg | SUBCUTANEOUS | Status: DC
  Filled 2012-06-19: qty 0.4

## 2012-06-19 MED ORDER — SIMVASTATIN 20 MG PO TABS
20.0000 mg | ORAL_TABLET | Freq: Every day | ORAL | Status: DC
  Administered 2012-06-19 – 2012-06-21 (×3): 20 mg via ORAL
  Filled 2012-06-19 (×4): qty 1

## 2012-06-19 MED ORDER — ONDANSETRON HCL 4 MG/2ML IJ SOLN: 4.0000 mg | Freq: Three times a day (TID) | INTRAMUSCULAR | Status: AC | PRN

## 2012-06-19 MED ORDER — BUPROPION HCL ER (XL) 150 MG PO TB24
150.0000 mg | ORAL_TABLET | Freq: Every day | ORAL | Status: DC
  Administered 2012-06-19 – 2012-06-22 (×4): 150 mg via ORAL
  Filled 2012-06-19 (×4): qty 1

## 2012-06-19 MED ORDER — SODIUM CHLORIDE 0.9 % IV SOLN: 250.0000 mL | INTRAVENOUS | Status: DC | PRN

## 2012-06-19 MED ORDER — DEXTROSE 5 % IV SOLN
1.0000 g | INTRAVENOUS | Status: DC
  Administered 2012-06-19 – 2012-06-20 (×2): 1 g via INTRAVENOUS
  Filled 2012-06-19 (×2): qty 10

## 2012-06-19 MED ORDER — ACETAMINOPHEN 325 MG PO TABS: 650.0000 mg | ORAL_TABLET | Freq: Four times a day (QID) | ORAL | Status: DC | PRN

## 2012-06-19 MED ORDER — IPRATROPIUM BROMIDE 0.02 % IN SOLN: 0.5000 mg | Freq: Four times a day (QID) | RESPIRATORY_TRACT | Status: DC

## 2012-06-19 MED ORDER — POTASSIUM CHLORIDE 20 MEQ/15ML (10%) PO LIQD
20.0000 meq | Freq: Two times a day (BID) | ORAL | Status: DC
  Administered 2012-06-19 – 2012-06-22 (×7): 20 meq via ORAL
  Filled 2012-06-19 (×8): qty 15

## 2012-06-19 MED ORDER — DEXTROSE 5 % IV SOLN: 1.0000 g | INTRAVENOUS | Status: DC

## 2012-06-19 MED ORDER — BIOTENE DRY MOUTH MT LIQD
15.0000 mL | Freq: Two times a day (BID) | OROMUCOSAL | Status: DC
  Administered 2012-06-19 – 2012-06-22 (×7): 15 mL via OROMUCOSAL

## 2012-06-19 NOTE — Progress Notes (Signed)
Patient with new PE was asked by MD to see if she will be a Xarelto candidate.  Since she does not have a history of stroke or liver impairement and her CrCl is > 30 mL/min, she will be a candidate for Xarelto as long as she doesn't have any increased risk of clinically significant bleeding.  If Xarelto is started, below is the dosing.  1) Xarelto 15mg  po bid x 3 weeks, then 20mg  po qday.  Start the 1st dose of xarelto 0 to 2 hours before the time that the next dose of lovenox is given.  And d/c lovenox.

## 2012-06-19 NOTE — Progress Notes (Signed)
Name: Raven Gray MRN: [id number] DOB: Dec 08, 1927    LOS: 1  PULMONARY / CRITICAL CARE MEDICINE  HPI:  Ms. Bernick is an 78 year-old lady admitted to the hospital with acute exacerbation of copd, possible chf exacerbation, who had a chest x-ray which indicated possible nodularity in the periphery.  We were consulted for comprehensive evaluation of abnormal chest x-ray.  Specifically we were asked to address the possibility of MAI.  The patient denies more than a mild cough, never productive, denies fevers, chills, unintentional weight loss, or fatigue.   Overnight: CT chest - pulmonary emboli, small/ moderate clot burden. Now on Pharma/ Lovenox   Vital Signs: Temp:  [97 F (36.1 C)-98.1 F (36.7 C)] 97 F (36.1 C) (09/28 0223) Pulse Rate:  [60-99] 99  (09/28 0223) Resp:  [18-27] 24  (09/28 0100) BP: (100-141)/(53-73) 126/73 mmHg (09/28 0223) SpO2:  [83 %-100 %] 91 % (09/28 0223) Weight:  [86 kg (189 lb 9.5 oz)] 86 kg (189 lb 9.5 oz) (09/28 0223)  Physical Examination: General: No apparent distress. Obese Eyes: Anicteric sclerae. ENT: Oropharynx clear. Moist mucous membranes. No thrush Lymph: No cervical, supraclavicular, or axillary lymphadenopathy. Heart: Normal S1, S2. No murmurs, rubs, or gallops appreciated. No bruits, equal pulses. Lungs: Normal excursion, no dullness to percussion. Good air movement bilaterally, without wheezes or crackles. Normal upper airway sounds without evidence of stridor. Abdomen: Abdomen soft, non-tender and not distended, normoactive bowel sounds. No hepatosplenomegaly or masses. Musculoskeletal:Equivocal Homan's bilaterally Skin: No rashes or lesions Neuro: No focal neurologic deficits.   Intake/Output      09/27 0701 - 09/28 0700 09/28 0701 - 09/29 0700   P.O. 240    Total Intake(mL/kg) 240 (2.8)    Net +240          Lab Results  Component Value Date   WBC 11.9* 06/19/2012   HGB 12.6 06/19/2012   HCT 37.3 06/19/2012   MCV 86.1 06/19/2012   PLT  172 06/19/2012   Lab Results  Component Value Date   CREATININE 0.99 06/19/2012   BUN 14 06/19/2012   NA 131* 06/19/2012   K 3.1* 06/19/2012   CL 93* 06/19/2012   CO2 29 06/19/2012      ASSESSMENT AND PLAN Active Problems:  Hypokalemia  COPD with acute exacerbation  CHF exacerbation  Hypoxia  Community acquired pneumonia   Pulmonary embolism: New dx pulmonary emboli by CT 9/28. Equivocal Homan's She and family in room deny personal or family hx of varices/ DVT/PE  P- Dx made, and likely explains the lung nodularity in question. Pharmacy/ Lovenox engaged.      -Consider leg vein dopplers for DVT      -Pulmonary will sign off. Please reconsult if needed.  CD Maple Hudson, MD Pulmonary and Critical Care Medicine [facility]   06/19/2012, 10:39 AM

## 2012-06-19 NOTE — H&P (Signed)
PCP:  [facility]   Chief Complaint:   Short of breath  HPI: Raven Gray is a 78 y.o. female   has a past medical history of Hypertension and COPD (chronic obstructive pulmonary disease).   Presented with  1 week hx of shortness of breath, nausea, weak all over, tremor worse with use of inhalers. She is not on oxygen at home. Some cough. No chest pain no fever. She have had diarrhea started today. Have not been on antibiotics.  She has been on lasix for a while but recently it has been increased.  Review of Systems:    Pertinent positives include: minimal weight loss nausea, diarrhea, shortness of breath at rest, dyspnea on exertion, non-productive cough, Constitutional:  No weight loss, night sweats, Fevers, chills, fatigue,  HEENT:  No headaches, Difficulty swallowing,Tooth/dental problems,Sore throat,  No sneezing, itching, ear ache, nasal congestion, post nasal drip,  Cardio-vascular:  No chest pain, Orthopnea, PND, anasarca, dizziness, palpitations.no Bilateral lower extremity swelling  GI:  No heartburn, indigestion, abdominal pain, vomiting,  change in bowel habits, loss of appetite, melena, blood in stool, hematemesis Resp:  no . No  No excess mucus, no productive cough, No  No coughing up of blood.No change in color of mucus.No wheezing. Skin:  no rash or lesions. No jaundice GU:  no dysuria, change in color of urine, no urgency or frequency. No straining to urinate.  No flank pain.  Musculoskeletal:  No joint pain or no joint swelling. No decreased range of motion. No back pain.  Psych:  No change in mood or affect. No depression or anxiety. No memory loss.  Neuro: no localizing neurological complaints, no tingling, no weakness, no double vision, no gait abnormality, no slurred speech, no confusion  Otherwise ROS are negative except for above, 10 systems were reviewed  Past Medical History: Past Medical History  Diagnosis Date  . Hypertension   . COPD (chronic  obstructive pulmonary disease)    Past Surgical History  Procedure Date  . Replacement total knee      Medications: Prior to Admission medications   Medication Sig Start Date End Date Taking? Authorizing Provider  buPROPion (WELLBUTRIN XL) 150 MG 24 hr tablet Take 150 mg by mouth daily.   Yes Historical Provider, MD  furosemide (LASIX) 40 MG tablet Take 40 mg by mouth daily.   Yes Historical Provider, MD  Ibuprofen-Diphenhydramine Cit (ADVIL PM) 200-38 MG TABS Take 1 tablet by mouth at bedtime as needed. For pain or sleep   Yes Historical Provider, MD  metoprolol (LOPRESSOR) 50 MG tablet Take 50 mg by mouth 2 (two) times daily.   Yes Historical Provider, MD  mirabegron ER (MYRBETRIQ) 50 MG TB24 Take 50 mg by mouth daily.   Yes Historical Provider, MD  potassium chloride SA (K-DUR,KLOR-CON) 20 MEQ tablet Take 20 mEq by mouth 2 (two) times daily.   Yes Historical Provider, MD  pravastatin (PRAVACHOL) 40 MG tablet Take 40 mg by mouth every evening.    Yes Historical Provider, MD  tiotropium (SPIRIVA) 18 MCG inhalation capsule Place 18 mcg into inhaler and inhale daily.   Yes Historical Provider, MD  venlafaxine XR (EFFEXOR-XR) 150 MG 24 hr capsule Take 150 mg by mouth daily.   Yes Historical Provider, MD    Allergies:   Allergies  Allergen Reactions  . Prednisone Other (See Comments)    Reaction-abnormal behavior "makes me feel like I'm flying"    Social History:  Ambulatory with walker cane lately Lives at  Home alone   reports that she has never smoked. She does not have any smokeless tobacco history on file. She reports that she does not drink alcohol or use illicit drugs.   Family History: family history includes Diabetes type II in her daughter.    Physical Exam: Patient Vitals for the past 24 hrs:  BP Temp Temp src Pulse Resp SpO2  06/18/12 2100 125/70 mmHg - - 66  19  95 %  06/18/12 2045 134/66 mmHg - - 66  21  94 %  06/18/12 2008 125/71 mmHg 98.1 F (36.7 C) Oral  60  24  98 %  06/18/12 1646 - - - - - 98 %  06/18/12 1640 - - - - - 83 %  06/18/12 1629 100/60 mmHg 98 F (36.7 C) Oral 61  18  100 %    1. General:  in No Acute distress 2. Psychological: Alert and Oriented 3. Head/ENT:   Moist  Mucous Membranes                          Head Non traumatic, neck supple                          Normal Dentition 4. SKIN: normal  Skin turgor,  Skin clean Dry and intact no rash 5. Heart: Regular rate and rhythm no Murmur, Rub or gallop 6. Lungs:no wheezes but poor air movement, occasional crackles   7. Abdomen: Soft, non-tender, Non distended 8. Lower extremities: no clubbing, cyanosis, or edema 9. Neurologically Grossly intact, moving all 4 extremities equally 10. MSK: Normal range of motion  body mass index is unknown because there is no height or weight on file.   Labs on Admission:   [facility] 06/18/12 2044 06/18/12 1705  NA -- 132*  K -- 2.8*  CL -- 88*  CO2 -- 31  GLUCOSE -- 120*  BUN -- 16  CREATININE -- 1.14*  CALCIUM -- 8.8  MG 1.6 --  PHOS -- --    Basename 06/18/12 1705  AST 21  ALT 23  ALKPHOS 109  BILITOT 0.7  PROT 7.0  ALBUMIN 3.1*   No results found for this basename: LIPASE:2,AMYLASE:2 in the last 72 hours  Basename 06/18/12 1705  WBC 10.5  NEUTROABS 8.7*  HGB 13.3  HCT 39.8  MCV 86.0  PLT 185   No results found for this basename: CKTOTAL:3,CKMB:3,CKMBINDEX:3,TROPONINI:3 in the last 72 hours No results found for this basename: TSH,T4TOTAL,FREET3,T3FREE,THYROIDAB in the last 72 hours No results found for this basename: VITAMINB12:2,FOLATE:2,FERRITIN:2,TIBC:2,IRON:2,RETICCTPCT:2 in the last 72 hours No results found for this basename: HGBA1C    CrCl is unknown because there is no height on file for the current visit. ABG    Component Value Date/Time   HCO3 29.3* 02/28/2007 1654   TCO2 31 07/02/2008 2001     Lab Results  Component Value Date   DDIMER  Value: 0.62        AT THE INHOUSE ESTABLISHED CUTOFF  VALUE OF 0.48 ug/mL FEU, THIS ASSAY HAS BEEN DOCUMENTED IN THE LITERATURE TO HAVE* 02/28/2007     Other results:  I have pearsonaly reviewed this: ECG REPORT  Rate: 60  Rhythm: NSR ST&T Change: St depressions no change from prior   BNP 1553  Cultures: No results found for this basename: sdes, specrequest, cult, reptstatus       Radiological Exams on Admission: Dg Chest 2 View  06/18/2012  *RADIOLOGY REPORT*  Clinical Data: Shortness of breath.  CHEST - 2 VIEW  Comparison: Chest x-ray 03/27/2010.  Findings: No acute consolidative airspace disease. Prominence of the interstitial markings has been noted on prior examinations, however, is increased on today's study, with a suggestion of some peripheral micronodularity.  No pleural effusions.  No evidence of pulmonary edema.  Heart size is borderline enlarged. The patient is rotated to the left on today's exam, resulting in distortion of the mediastinal contours and reduced diagnostic sensitivity and specificity for mediastinal pathology.  Atherosclerosis in the thoracic aorta.  Large hiatal hernia again noted.  IMPRESSION: 1.  Increased prominence of interstitial markings with suggestion of peripheral micronodularity throughout the lungs bilaterally. This is nonspecific, and could suggest a chronic indolent atypical infectious process such as MAI (Mycobacterium avium- intracellulare).  This could be better evaluated with non emergent chest CT if clinically indicated. 2.  Borderline cardiomegaly. 3.  Atherosclerosis. 4.  Large hiatal hernia again noted.   Original Report Authenticated By: Florencia Reasons, M.D.     Chart has been reviewed  Assessment/Plan  78 yo F with possible atypical infection  Present on Admission:  .Community acquired pneumonia - cannot rule out MAI will obtain AFB sputum have spoken to pulmonology who was seen in consult tomorrow. Will obtain CT scan of the chest to try to clarify this father. For now cover for  community-acquired pneumonia with Rocephin and Zithromax. .Hypokalemia -will replace check magnesium level .COPD with acute exacerbation - she may have an underlying mild COPD exacerbation patient is not a candidate for steroids given questionable MAI and history of bad reaction to steroids for now will continue antibiotics and nebulizers .CHF exacerbation - from a heart failure standpoint she has been diuresing well home and does not appear to be fluid overloaded .Hypoxia - likely multifactorial we'll make sure she is on oxygen    Prophylaxis:  Lovenox, Protonix  CODE STATUS: DNR/DNI  Other plan as per orders.  I have spent a total of 55 min on this admission, have spoken to pulmonology to put patient on the list for consult.  Damontre Millea 06/19/2012, 12:11 AM

## 2012-06-19 NOTE — Plan of Care (Signed)
Problem: Consults Goal: Heart Failure Patient Education (See Patient Education module for education specifics.) Outcome: Progressing Daily weights, diet and hf zones reviewed with patient

## 2012-06-19 NOTE — ED Provider Notes (Signed)
Medical screening examination/treatment/procedure(s) were performed by non-physician practitioner and as supervising physician I was immediately available for consultation/collaboration.  Devian Bartolomei R. Gabreal Worton, MD 06/19/12 2255 

## 2012-06-19 NOTE — Progress Notes (Addendum)
ANTICOAGULATION CONSULT NOTE - Initial Consult  Pharmacy Consult for lovenox Indication: pulmonary embolus  Allergies  Allergen Reactions  . Prednisone Other (See Comments)    Reaction-abnormal behavior "makes me feel like I'm flying"    Patient Measurements: Height: 5\' 3"  (160 cm) Weight: 189 lb 9.5 oz (86 kg) IBW/kg (Calculated) : 52.4  Heparin Dosing Weight: 85 kg  Vital Signs: Temp: 97 F (36.1 C) (09/28 0223) Temp src: Oral (09/28 0223) BP: 126/73 mmHg (09/28 0223) Pulse Rate: 99  (09/28 0223)  Labs:  Basename 06/19/12 0530 06/19/12 0228 06/18/12 1705  HGB -- 12.6 13.3  HCT -- 37.3 39.8  PLT -- 172 185  APTT -- -- --  LABPROT -- -- --  INR -- -- --  HEPARINUNFRC -- -- --  CREATININE 0.99 1.07 1.14*  CKTOTAL -- -- --  CKMB -- -- --  TROPONINI -- -- --    Estimated Creatinine Clearance: 45.5 ml/min (by C-G formula based on Cr of 0.99).   Medical History: Past Medical History  Diagnosis Date  . Hypertension   . COPD (chronic obstructive pulmonary disease)   . Shortness of breath     Medications:  Scheduled:    . ipratropium  0.5 mg Nebulization Q4H   And  . albuterol  2.5 mg Nebulization Q4H  . antiseptic oral rinse  15 mL Mouth Rinse BID  . azithromycin  500 mg Intravenous Q24H  . buPROPion  150 mg Oral Daily  . cefTRIAXone (ROCEPHIN)  IV  1 g Intravenous Q24H  . enoxaparin  40 mg Subcutaneous Q24H  . furosemide  40 mg Oral Daily  . influenza  inactive virus vaccine  0.5 mL Intramuscular Tomorrow-1000  . metoprolol  50 mg Oral BID  . mirabegron ER  50 mg Oral Daily  . pneumococcal 23 valent vaccine  0.5 mL Intramuscular Tomorrow-1000  . potassium chloride  10 mEq Intravenous Once  . potassium chloride  10 mEq Intravenous Once  . potassium chloride  10 mEq Intravenous Q1 Hr x 4  . potassium chloride  10 mEq Intravenous Q1 Hr x 4  . potassium chloride  20 mEq Oral BID  . potassium chloride  40 mEq Oral Once  . simvastatin  20 mg Oral q1800  .  sodium chloride  3 mL Intravenous Q12H  . venlafaxine XR  150 mg Oral Daily  . DISCONTD: cefTRIAXone (ROCEPHIN)  IV  1 g Intravenous Q24H  . DISCONTD: cefTRIAXone (ROCEPHIN)  IV  1 g Intravenous Q24H  . DISCONTD: cefTRIAXone (ROCEPHIN)  IV  1 g Intravenous Q24H  . DISCONTD: ipratropium  0.5 mg Nebulization Q6H  . DISCONTD: potassium chloride SA  20 mEq Oral BID  . DISCONTD: potassium chloride  40 mEq Oral Once   Infusions:    Assessment: 78 yo female with bilateral segmental/subsegmental pulmonary emboli on chest CT will be started on lovenox full dose therapy.  Baseline CBC ok.  SCr 0.99 (CrCl ~46).  Goal of Therapy:  Anti-Xa level 0.6-1.2 units/ml 4hrs after LMWH dose given Monitor platelets by anticoagulation protocol: Yes   Plan:  1) D/c lovenox 40mg  sq q24h (hasn't gotten yet). Start Lovenox 85 mg sq q12h 2) CBC every 72 hours 3) Follow up on starting coumadin therapy.  Kabrea Seeney, Tsz-Yin 06/19/2012,10:14 AM

## 2012-06-19 NOTE — Progress Notes (Signed)
Pt seen and examined Admitted this am with SoB and chronic back pain CTA with bilateral PE, just started lovenox per pharmacy will ask them to review Rivaroxaban  Also COPD exacerbation And case manager to look at cost/Copay etc Continue IV abx Mike Craze today  Zannie Cove, MD [phone]

## 2012-06-19 NOTE — Consult Note (Signed)
Name: Raven Gray MRN: [id number] DOB: [birth year]    LOS: 1  PULMONARY / CRITICAL CARE MEDICINE  HPI:  Ms. Raven Gray is an 78 year-old lady admitted to the hospital with acute exacerbation of copd, possible chf exacerbation, who had a chest x-ray which indicated possible nodularity in the periphery.  We were consulted for comprehensive evaluation of abnormal chest x-ray.  Specifically we were asked to address the possibility of MAI.  The patient denies more than a mild cough, never productive, denies fevers, chills, unintentional weight loss, or fatigue.   Past Medical History  Diagnosis Date  . Hypertension   . COPD (chronic obstructive pulmonary disease)   . Shortness of breath    Past Surgical History  Procedure Date  . Replacement total knee    Prior to Admission medications   Medication Sig Start Date End Date Taking? Authorizing Provider  buPROPion (WELLBUTRIN XL) 150 MG 24 hr tablet Take 150 mg by mouth daily.   Yes Historical Provider, MD  furosemide (LASIX) 40 MG tablet Take 40 mg by mouth daily.   Yes Historical Provider, MD  Ibuprofen-Diphenhydramine Cit (ADVIL PM) 200-38 MG TABS Take 1 tablet by mouth at bedtime as needed. For pain or sleep   Yes Historical Provider, MD  metoprolol (LOPRESSOR) 50 MG tablet Take 50 mg by mouth 2 (two) times daily.   Yes Historical Provider, MD  mirabegron ER (MYRBETRIQ) 50 MG TB24 Take 50 mg by mouth daily.   Yes Historical Provider, MD  potassium chloride SA (K-DUR,KLOR-CON) 20 MEQ tablet Take 20 mEq by mouth 2 (two) times daily.   Yes Historical Provider, MD  pravastatin (PRAVACHOL) 40 MG tablet Take 40 mg by mouth every evening.    Yes Historical Provider, MD  tiotropium (SPIRIVA) 18 MCG inhalation capsule Place 18 mcg into inhaler and inhale daily.   Yes Historical Provider, MD  venlafaxine XR (EFFEXOR-XR) 150 MG 24 hr capsule Take 150 mg by mouth daily.   Yes Historical Provider, MD   Allergies Allergies  Allergen Reactions  . Prednisone  Other (See Comments)    Reaction-abnormal behavior "makes me feel like I'm flying"    Family History Family History  Problem Relation Age of Onset  . Diabetes type II Daughter    Social History  reports that she has never smoked. She does not have any smokeless tobacco history on file. She reports that she does not drink alcohol or use illicit drugs.  Review Of Systems:  All other systems reviewed and were negative except as per HPI   Vital Signs: Temp:  [97 F (36.1 C)-98.1 F (36.7 C)] 97 F (36.1 C) (09/28 0223) Pulse Rate:  [60-99] 99  (09/28 0223) Resp:  [18-27] 24  (09/28 0100) BP: (100-141)/(53-73) 126/73 mmHg (09/28 0223) SpO2:  [83 %-100 %] 91 % (09/28 0223) Weight:  [86 kg (189 lb 9.5 oz)] 86 kg (189 lb 9.5 oz) (09/28 0223)  Physical Examination: General: No apparent distress. Eyes: Anicteric sclerae. ENT: Oropharynx clear. Moist mucous membranes. No thrush Lymph: No cervical, supraclavicular, or axillary lymphadenopathy. Heart: Normal S1, S2. No murmurs, rubs, or gallops appreciated. No bruits, equal pulses. Lungs: Normal excursion, no dullness to percussion. Good air movement bilaterally, without wheezes or crackles. Normal upper airway sounds without evidence of stridor. Abdomen: Abdomen soft, non-tender and not distended, normoactive bowel sounds. No hepatosplenomegaly or masses. Musculoskeletal: No clubbing or synovitis. Skin: No rashes or lesions Neuro: No focal neurologic deficits.   Intake/Output  09/27 0701 - 09/28 0700   P.O. 240   Total Intake(mL/kg) 240 (2.8)   Net +240        Lab Results  Component Value Date   WBC 11.9* 06/19/2012   HGB 12.6 06/19/2012   HCT 37.3 06/19/2012   MCV 86.1 06/19/2012   PLT 172 06/19/2012   Lab Results  Component Value Date   CREATININE 1.14* 06/18/2012   BUN 16 06/18/2012   NA 132* 06/18/2012   K 2.8* 06/18/2012   CL 88* 06/18/2012   CO2 31 06/18/2012      ASSESSMENT AND PLAN Active Problems:   Hypokalemia  COPD with acute exacerbation  CHF exacerbation  Hypoxia  Community acquired pneumonia The patient does not likely have MAI.  If so desired, you could get a chest CT to better evaluate, as the diagnosis is not made by chest x-ray.  Additionally, you could send a sputum for AFB.  Even if she did have MAI, it is unlikely, given her spectrum of symptoms, that we would treat her for this.    Lavella Hammock, M.D. Pulmonary and Critical Care Medicine [facility] Pager: [phone]  06/19/2012, 3:10 AM

## 2012-06-19 NOTE — Progress Notes (Signed)
PATIENT ARRIVED TO UNIT FROM ED VIA STRETCHER. PATIENT ADMISSION WEIGHT AND VITALS OBTAINED. FALL AND SAFETY PLAN REVIEWED WITH PATIENT. CURRENTLY RESTING, CALL LIGHT WITHIN REACH WITH BED ALARM ON. WILL CONTINUE TO MONITOR. Blood pressure 126/73, pulse 99, temperature 97 F (36.1 C), temperature source Oral, resp. rate 24, height 5\' 3"  (1.6 m), weight 86 kg (189 lb 9.5 oz), SpO2 91.00%.  Raven Gray

## 2012-06-20 DIAGNOSIS — I2699 Other pulmonary embolism without acute cor pulmonale: Principal | ICD-10-CM

## 2012-06-20 DIAGNOSIS — J441 Chronic obstructive pulmonary disease with (acute) exacerbation: Secondary | ICD-10-CM

## 2012-06-20 LAB — LEGIONELLA ANTIGEN, URINE: Legionella Antigen, Urine: NEGATIVE

## 2012-06-20 LAB — CBC
HCT: 35 % — ABNORMAL LOW (ref 36.0–46.0)
Hemoglobin: 11.6 g/dL — ABNORMAL LOW (ref 12.0–15.0)
MCV: 86.6 fL (ref 78.0–100.0)
RBC: 4.04 MIL/uL (ref 3.87–5.11)
RDW: 13.6 % (ref 11.5–15.5)
WBC: 9.6 10*3/uL (ref 4.0–10.5)

## 2012-06-20 LAB — STREP PNEUMONIAE URINARY ANTIGEN: Strep Pneumo Urinary Antigen: POSITIVE — AB

## 2012-06-20 MED ORDER — INFLUENZA VIRUS VACC SPLIT PF IM SUSP
0.5000 mL | Freq: Once | INTRAMUSCULAR | Status: AC
  Administered 2012-06-20: 0.5 mL via INTRAMUSCULAR
  Filled 2012-06-20 (×2): qty 0.5

## 2012-06-20 MED ORDER — LEVOFLOXACIN 500 MG PO TABS
500.0000 mg | ORAL_TABLET | Freq: Every day | ORAL | Status: DC
  Administered 2012-06-20 – 2012-06-22 (×3): 500 mg via ORAL
  Filled 2012-06-20 (×3): qty 1

## 2012-06-20 MED ORDER — RIVAROXABAN 15 MG PO TABS
15.0000 mg | ORAL_TABLET | Freq: Two times a day (BID) | ORAL | Status: DC
  Administered 2012-06-20 – 2012-06-22 (×5): 15 mg via ORAL
  Filled 2012-06-20 (×7): qty 1

## 2012-06-20 NOTE — Progress Notes (Signed)
Urine collected and sent.

## 2012-06-20 NOTE — Progress Notes (Signed)
[facility]             Progress Note   Subjective: Breathing better  Objective: Vital signs in last 24 hours: Temp:  [97.8 F (36.6 C)-98.5 F (36.9 C)] 98.4 F (36.9 C) (09/29 0609) Pulse Rate:  [59-69] 69  (09/29 0928) Resp:  [20-22] 20  (09/29 0609) BP: (150-182)/(58-80) 157/73 mmHg (09/29 0928) SpO2:  [93 %-97 %] 96 % (09/29 0801) Weight change:  Last BM Date: 06/18/12  Intake/Output from previous day: 09/28 0701 - 09/29 0700 In: 340 [P.O.:340] Out: 1000 [Urine:1000] Total I/O In: 123 [P.O.:120; I.V.:3] Out: 200 [Urine:200]   Physical Exam: General: Alert, awake, oriented x3, in no acute distress. HEENT: No bruits, no goiter. Heart: Regular rate and rhythm, without murmurs, rubs, gallops. Lungs: Clear to auscultation bilaterally. Abdomen: Soft, nontender, nondistended, positive bowel sounds. Extremities: No clubbing cyanosis or edema with positive pedal pulses. Neuro: Grossly intact, nonfocal.    Lab Results: Basic Metabolic Panel:  Basename 06/19/12 0530 06/19/12 0228 06/18/12 2044 06/18/12 1705  NA 131* -- -- 132*  K 3.1* -- -- 2.8*  CL 93* -- -- 88*  CO2 29 -- -- 31  GLUCOSE 168* -- -- 120*  BUN 14 -- -- 16  CREATININE 0.99 1.07 -- --  CALCIUM 8.0* -- -- 8.8  MG -- -- 1.6 --  PHOS -- -- -- --   Liver Function Tests:  Basename 06/19/12 0530 06/18/12 1705  AST 20 21  ALT 19 23  ALKPHOS 93 109  BILITOT 0.7 0.7  PROT 5.9* 7.0  ALBUMIN 2.6* 3.1*   No results found for this basename: LIPASE:2,AMYLASE:2 in the last 72 hours No results found for this basename: AMMONIA:2 in the last 72 hours CBC:  Basename 06/20/12 0610 06/19/12 0228 06/18/12 1705  WBC 9.6 11.9* --  NEUTROABS -- -- 8.7*  HGB 11.6* 12.6 --  HCT 35.0* 37.3 --  MCV 86.6 86.1 --  PLT 183 172 --   Cardiac Enzymes: No results found for this basename: CKTOTAL:3,CKMB:3,CKMBINDEX:3,TROPONINI:3 in the last 72 hours BNP:  [facility] 06/18/12 2044  PROBNP 1553.0*    D-Dimer: No results found for this basename: DDIMER:2 in the last 72 hours CBG: No results found for this basename: GLUCAP:6 in the last 72 hours Hemoglobin A1C: No results found for this basename: HGBA1C in the last 72 hours Fasting Lipid Panel: No results found for this basename: CHOL,HDL,LDLCALC,TRIG,CHOLHDL,LDLDIRECT in the last 72 hours Thyroid Function Tests: No results found for this basename: TSH,T4TOTAL,FREET4,T3FREE,THYROIDAB in the last 72 hours Anemia Panel: No results found for this basename: VITAMINB12,FOLATE,FERRITIN,TIBC,IRON,RETICCTPCT in the last 72 hours Coagulation:  Basename 06/19/12 1036  LABPROT 13.7  INR 1.06   Urine Drug Screen: Drugs of Abuse  No results found for this basename: labopia, cocainscrnur, labbenz, amphetmu, thcu, labbarb    Alcohol Level: No results found for this basename: ETH:2 in the last 72 hours Urinalysis: No results found for this basename: COLORURINE:2,APPERANCEUR:2,LABSPEC:2,PHURINE:2,GLUCOSEU:2,HGBUR:2,BILIRUBINUR:2,KETONESUR:2,PROTEINUR:2,UROBILINOGEN:2,NITRITE:2,LEUKOCYTESUR:2 in the last 72 hours  No results found for this or any previous visit (from the past 240 hour(s)).  Studies/Results: Dg Chest 2 View  06/18/2012  *RADIOLOGY REPORT*  Clinical Data: Shortness of breath.  CHEST - 2 VIEW  Comparison: Chest x-ray 03/27/2010.  Findings: No acute consolidative airspace disease. Prominence of the interstitial markings has been noted on prior examinations, however, is increased on today's study, with a suggestion of some peripheral micronodularity.  No pleural effusions.  No evidence of pulmonary edema.  Heart size is borderline enlarged. The  patient is rotated to the left on today's exam, resulting in distortion of the mediastinal contours and reduced diagnostic sensitivity and specificity for mediastinal pathology.  Atherosclerosis in the thoracic aorta.  Large hiatal hernia again noted.  IMPRESSION: 1.  Increased prominence of  interstitial markings with suggestion of peripheral micronodularity throughout the lungs bilaterally. This is nonspecific, and could suggest a chronic indolent atypical infectious process such as MAI (Mycobacterium avium- intracellulare).  This could be better evaluated with non emergent chest CT if clinically indicated. 2.  Borderline cardiomegaly. 3.  Atherosclerosis. 4.  Large hiatal hernia again noted.   Original Report Authenticated By: Florencia Reasons, M.D.    Ct Chest W Contrast  06/19/2012  **ADDENDUM** CREATED: 06/19/2012 10:01:32  Critical Value/emergent results were called by telephone at the time of interpretation on 06/19/2012 at 1000 hours to Dr. Zannie Cove, who verbally acknowledged these results.  Overall clot burden is small to moderate.  Of note, one of the mild left upper lobe opacities is subpleural (series 3/image 24) and could reflect a small pulmonary infarct.  **END ADDENDUM** SIGNED BY: Charline Bills, M.D.   06/19/2012  *RADIOLOGY REPORT*  Clinical Data: Hypokalemia, evaluate for MAI  CT CHEST WITH CONTRAST  Technique:  Multidetector CT imaging of the chest was performed following the standard protocol during bolus administration of intravenous contrast.  Contrast: 60mL OMNIPAQUE IOHEXOL 300 MG/ML  SOLN  Comparison: Chest radiographs dated 06/18/2012  Findings: Although not tailored for evaluation of the pulmonary arteries, there is a segmental pulmonary embolus at the bifurcation of the left main pulmonary artery (series 2/image 23) and subsegmental pulmonary emboli within bilateral lower lobe pulmonary arteries (series 2/images 27 and 32).  Minimal patchy opacities in the left upper lobe/lingula (for example, series 3/image 24), possibly atelectasis and/or scarring. Mild atelectasis / scarring in the bilateral lung bases. No pleural effusion or pneumothorax.  Visualized thyroid is unremarkable.  The heart is normal in size.  No pericardial effusion.  Moderate hiatal hernia.   Visualized upper abdomen is also notable for suspected right upper pole cyst and mild prominence of the common duct, although likely at the upper limits of normal for age.  Degenerative changes of the visualized thoracolumbar spine.  IMPRESSION: Bilateral segmental/subsegmental pulmonary emboli, as described above.   Original Report Authenticated By: Charline Bills, M.D.     Medications: Scheduled Meds:   . albuterol  2.5 mg Nebulization TID  . antiseptic oral rinse  15 mL Mouth Rinse BID  . azithromycin  500 mg Intravenous Q24H  . buPROPion  150 mg Oral Daily  . cefTRIAXone (ROCEPHIN)  IV  1 g Intravenous Q24H  . enoxaparin (LOVENOX) injection  85 mg Subcutaneous Q12H  . furosemide  40 mg Oral Daily  . influenza  inactive virus vaccine  0.5 mL Intramuscular Tomorrow-1000  . metoprolol  50 mg Oral BID  . mirabegron ER  50 mg Oral Daily  . pneumococcal 23 valent vaccine  0.5 mL Intramuscular Tomorrow-1000  . potassium chloride  10 mEq Intravenous Q1 Hr x 4  . potassium chloride  20 mEq Oral BID  . simvastatin  20 mg Oral q1800  . sodium chloride  3 mL Intravenous Q12H  . venlafaxine XR  150 mg Oral Daily  . DISCONTD: albuterol  2.5 mg Nebulization Q4H  . DISCONTD: ipratropium  0.5 mg Nebulization Q4H   Continuous Infusions:  PRN Meds:.sodium chloride, acetaminophen, albuterol, HYDROcodone-acetaminophen, ondansetron (ZOFRAN) IV, sodium chloride  Assessment/Plan: 1. Bilateral  PE Transition  to xarelto today DC lovenox Clinically stable and improved  2. COPD with mild exacerbation Improved, strep pneumo ag positive Transition to oral levaquin Continue nebs Wean O2  3. Chronic diastolic CHF: compensated Continue PO lasix, metoprolol  4. DVT prophylaxis: on therapeutic anticoagulation  DNR/  Ambulate /PT/OT Home soon   LOS: 2 days   [facility] Pager: [phone] 06/20/2012, 10:42 AM

## 2012-06-21 LAB — BASIC METABOLIC PANEL
BUN: 10 mg/dL (ref 6–23)
CO2: 30 mEq/L (ref 19–32)
Calcium: 8.9 mg/dL (ref 8.4–10.5)
GFR calc non Af Amer: 61 mL/min — ABNORMAL LOW (ref >90)
Glucose, Bld: 119 mg/dL — ABNORMAL HIGH (ref 70–99)
Sodium: 132 mEq/L — ABNORMAL LOW (ref 135–145)

## 2012-06-21 LAB — CBC
HCT: 37.6 % (ref 36.0–46.0)
Hemoglobin: 12.1 g/dL (ref 12.0–15.0)
MCH: 27.9 pg (ref 26.0–34.0)
MCHC: 32.2 g/dL (ref 30.0–36.0)
MCV: 86.8 fL (ref 78.0–100.0)
RBC: 4.33 MIL/uL (ref 3.87–5.11)

## 2012-06-21 MED ORDER — HYDROCODONE-ACETAMINOPHEN 5-325 MG PO TABS
1.0000 | ORAL_TABLET | Freq: Once | ORAL | Status: AC
  Administered 2012-06-21: 1 via ORAL

## 2012-06-21 MED ORDER — HYDROCODONE-ACETAMINOPHEN 5-325 MG PO TABS
1.0000 | ORAL_TABLET | Freq: Four times a day (QID) | ORAL | Status: DC | PRN
  Administered 2012-06-21: 2 via ORAL
  Filled 2012-06-21: qty 1

## 2012-06-21 MED ORDER — HYDROCODONE-ACETAMINOPHEN 5-325 MG PO TABS
1.0000 | ORAL_TABLET | Freq: Once | ORAL | Status: AC
  Administered 2012-06-22: 1 via ORAL
  Filled 2012-06-21 (×2): qty 1

## 2012-06-21 MED ORDER — HYDROCODONE-ACETAMINOPHEN 5-325 MG PO TABS: 1.0000 | ORAL_TABLET | Freq: Four times a day (QID) | ORAL | Status: DC | PRN

## 2012-06-21 NOTE — Care Management Note (Signed)
    Page 1 of 2   06/22/2012     12:26:21 PM   CARE MANAGEMENT NOTE 06/22/2012  Patient:  Raven Gray, Raven Gray   Account Number:  [id number]  Date Initiated:  06/21/2012  Documentation initiated by:  Tera Mater  Subjective/Objective Assessment:   78yo female admitted with SOB.  Pt. lives at home alone, however has assistance from children.     Action/Plan:   Discharge planning   Anticipated DC Date:  06/22/2012   Anticipated DC Plan:  HOME/SELF CARE      DC Planning Services  CM consult  Medication Assistance      Choice offered to / List presented to:  C-4 Adult Children   DME arranged  [employer]  WHEELCHAIR - MANUAL      DME agency  [facility]     HH arranged  HH-2 PT      [facility] agency  [facility]   Status of service:  Completed, signed off Medicare Important Message given?   (If response is "NO", the following Medicare IM given date fields will be blank) Date Medicare IM given:   Date Additional Medicare IM given:    Discharge Disposition:  HOME W HOME HEALTH SERVICES  Per UR Regulation:  Reviewed for med. necessity/level of care/duration of stay  If discussed at Long Length of Stay Meetings, dates discussed:    Comments:  06/22/12 1215 Tera Mater, RN, BSN NCM [phone] Orders for home oxygen noted.  Pt. has COPD and CHF and requires continuous oxygen at 2LPM [place].  TC to [place], with [facility] to give referral.   06/22/12 1115 Tera Mater, RN, BSN NCM [phone] In to speak with pt. about [facility] services and DME.  Gave list of HH agencies to dtr, [place], and with the pt.'s permission, she chose [facility].  Pt. wishes to have the wheelchair shipped to her home.  TC to [place], with [facility] to give referral for DME wheelchair with cushion and to [place], to give referral for [facility] PT.  Pt. to dc home today with family.  Order placed in [facility] .    06/21/12 1349 Tera Mater, RN, BSN NCM [phone] Noted order to check for co-pay for Xarelto.  Per  benefits check, pt. co-pay is $45 at any retail pharmacy.  PT will be evaluating pt. to check for [facility] needs.  NCM to follow for further discharge disposition.

## 2012-06-21 NOTE — Progress Notes (Signed)
SATURATION QUALIFICATIONS:  Patient Saturations on Room Air at Rest = 92%  Patient Saturations on Room Air while Ambulating = 87%  Patient Saturations on 2 Liters of oxygen while Ambulating = 96%  Statement of medical necessity for home oxygen: [facility] [phone] (pager)

## 2012-06-21 NOTE — Evaluation (Signed)
Physical Therapy Evaluation Patient Details Name: ROSELIA SNIPE MRN: [id number] DOB: [birth year] Today's Date: 06/21/2012 Time: 4696-2952 PT Time Calculation (min): 23 min  PT Assessment / Plan / Recommendation Clinical Impression  Patient s/p bil PE with decr mobility secondary to decr endurance and decr balance.  Will benefit from PT to address endurance and balance issues.  Family to provide 24 hour care.  PAtient did desat without O2 with ambulation.  Family requests a wheelchair for community mobility. MD:  Please give prescription for wheelchair:  Lightweight 18x16 wheelchair with footrests and desk arms with anti-tippers and basic cushion.  HHPT f/u.      PT Assessment  Patient needs continued PT services    Follow Up Recommendations  Home health PT;Supervision/Assistance - 24 hour    Barriers to Discharge        Equipment Recommendations  Wheelchair (measurements);Wheelchair cushion (measurements)    Recommendations for Other Services     Frequency Min 3X/week    Precautions / Restrictions Precautions Precautions: Fall Restrictions Weight Bearing Restrictions: No   Pertinent Vitals/Pain Desat with activity without O2, No pain      Mobility  Bed Mobility Bed Mobility: Sit to Supine;Scooting to [facility] Rolling Left: Not tested (comment) Left Sidelying to Sit: Not tested (comment) Sitting - Scoot to Edge of Bed: Not tested (comment) Sit to Supine: 5: Supervision;HOB flat Scooting to HOB: 4: Min assist Transfers Transfers: Sit to Stand;Stand to Sit Sit to Stand: 4: Min guard;With upper extremity assist;With armrests;From chair/3-in-1 Stand to Sit: 4: Min guard;With upper extremity assist;To bed Details for Transfer Assistance: cues for hand placement.  Family present and states that patient pulled on RW PTA and needed their help recently with this.   Ambulation/Gait Ambulation/Gait Assistance: 4: Min assist Ambulation Distance (Feet): 40 Feet Assistive device: Rolling  walker Ambulation/Gait Assistance Details: cues to stay close to RW.  Patient has difficulty standing upright and staying inside RW.  Needs max cues for safety with RW.   Gait Pattern: Step-to pattern;Decreased stride length;Shuffle;Trunk flexed Gait velocity: decreased Stairs: No Wheelchair Mobility Wheelchair Mobility: No    PT Diagnosis: Generalized weakness  PT Problem List: Decreased activity tolerance;Decreased balance;Decreased mobility;Decreased safety awareness;Decreased knowledge of precautions;Decreased knowledge of use of DME PT Treatment Interventions: DME instruction;Gait training;Functional mobility training;Therapeutic activities;Therapeutic exercise;Balance training;Patient/family education   PT Goals Acute Rehab PT Goals PT Goal Formulation: With patient Time For Goal Achievement: 06/28/12 Potential to Achieve Goals: Good Pt will go Supine/Side to Sit: Independently PT Goal: Supine/Side to Sit - Progress: Goal set today Pt will go Sit to Stand: Independently PT Goal: Sit to Stand - Progress: Goal set today Pt will Ambulate: with supervision;with least restrictive assistive device;51 - 150 feet PT Goal: Ambulate - Progress: Goal set today  Visit Information  Last PT Received On: 06/21/12 Assistance Needed: +1    Subjective Data  Subjective: "I feel better than I did yesterday." Patient Stated Goal: To go home   Prior Functioning  Home Living Lives With: Alone (at night only) Available Help at Discharge: Family;Available PRN/intermittently Type of Home: House Home Access: Ramped entrance Home Layout: One level Bathroom Shower/Tub: Tub/shower unit;Door;Curtain (sponge bathes at sink) Firefighter: Standard Home Adaptive Equipment: Bedside commode/3-in-1;Straight cane;Walker - rolling Additional Comments: Uses cane most ot the time; Used RW when she got sick. Prior Function Level of Independence: Independent with assistive device(s) Able to Take Stairs?:  No Driving: Yes Vocation: Retired Musician: No difficulties Dominant Hand: Right    Cognition  Overall Cognitive Status: Appears within functional limits for tasks assessed/performed Arousal/Alertness: Awake/alert Orientation Level: Appears intact for tasks assessed Behavior During Session: [facility] for tasks performed    Extremity/Trunk Assessment Right Upper Extremity Assessment RUE ROM/Strength/Tone: [facility] for tasks assessed Left Upper Extremity Assessment LUE ROM/Strength/Tone: WFL for tasks assessed Right Lower Extremity Assessment RLE ROM/Strength/Tone: [facility] for tasks assessed Left Lower Extremity Assessment LLE ROM/Strength/Tone: WFL for tasks assessed Trunk Assessment Trunk Assessment: Normal   Balance Static Standing Balance Static Standing - Balance Support: Bilateral upper extremity supported;During functional activity Static Standing - Level of Assistance: 4: Min assist Static Standing - Comment/# of Minutes: 2 minutes with RW  End of Session PT - End of Session Equipment Utilized During Treatment: Gait belt Activity Tolerance: Patient tolerated treatment well Patient left: with call bell/phone within reach;with family/visitor present;in bed Nurse Communication: Mobility status       INGOLD,Majorie Santee 06/21/2012, 1:33 PM  [facility] [phone] (pager)

## 2012-06-21 NOTE — Progress Notes (Signed)
Pt requesting additional Pain med, Page MD will pass info to night shift

## 2012-06-21 NOTE — Progress Notes (Signed)
[facility]             Progress Note   Subjective: Breathing better, chronic low back pain  Objective: Vital signs in last 24 hours: Temp:  [97.6 F (36.4 C)-98.7 F (37.1 C)] 97.9 F (36.6 C) (09/30 0539) Pulse Rate:  [56-72] 56  (09/30 1025) Resp:  [18] 18  (09/30 1025) BP: (149-171)/(60-83) 152/60 mmHg (09/30 1025) SpO2:  [95 %-99 %] 95 % (09/30 1025) Weight:  [84.324 kg (185 lb 14.4 oz)] 84.324 kg (185 lb 14.4 oz) (09/30 0539) Weight change:  Last BM Date: 06/18/12  Intake/Output from previous day: 09/29 0701 - 09/30 0700 In: 243 [P.O.:240; I.V.:3] Out: 200 [Urine:200] Total I/O In: 420 [P.O.:420] Out: -    Physical Exam: General: Alert, awake, oriented x3, in no acute distress. HEENT: No bruits, no goiter. Heart: Regular rate and rhythm, without murmurs, rubs, gallops. Lungs: Clear to auscultation bilaterally. Abdomen: Soft, nontender, nondistended, positive bowel sounds. Extremities: No clubbing cyanosis, trace edema with positive pedal pulses. Neuro: Grossly intact, nonfocal.    Lab Results: Basic Metabolic Panel:  Basename 06/21/12 0515 06/19/12 0530 06/18/12 2044  NA 132* 131* --  K 3.4* 3.1* --  CL 94* 93* --  CO2 30 29 --  GLUCOSE 119* 168* --  BUN 10 14 --  CREATININE 0.86 0.99 --  CALCIUM 8.9 8.0* --  MG -- -- 1.6  PHOS -- -- --   Liver Function Tests:  Basename 06/19/12 0530 06/18/12 1705  AST 20 21  ALT 19 23  ALKPHOS 93 109  BILITOT 0.7 0.7  PROT 5.9* 7.0  ALBUMIN 2.6* 3.1*   No results found for this basename: LIPASE:2,AMYLASE:2 in the last 72 hours No results found for this basename: AMMONIA:2 in the last 72 hours CBC:  Basename 06/21/12 0515 06/20/12 0610 06/18/12 1705  WBC 9.6 9.6 --  NEUTROABS -- -- 8.7*  HGB 12.1 11.6* --  HCT 37.6 35.0* --  MCV 86.8 86.6 --  PLT 182 183 --   Cardiac Enzymes: No results found for this basename: CKTOTAL:3,CKMB:3,CKMBINDEX:3,TROPONINI:3 in the last 72  hours BNP:  [facility] 06/18/12 2044  PROBNP 1553.0*   D-Dimer: No results found for this basename: DDIMER:2 in the last 72 hours CBG: No results found for this basename: GLUCAP:6 in the last 72 hours Hemoglobin A1C: No results found for this basename: HGBA1C in the last 72 hours Fasting Lipid Panel: No results found for this basename: CHOL,HDL,LDLCALC,TRIG,CHOLHDL,LDLDIRECT in the last 72 hours Thyroid Function Tests: No results found for this basename: TSH,T4TOTAL,FREET4,T3FREE,THYROIDAB in the last 72 hours Anemia Panel: No results found for this basename: VITAMINB12,FOLATE,FERRITIN,TIBC,IRON,RETICCTPCT in the last 72 hours Coagulation:  Basename 06/19/12 1036  LABPROT 13.7  INR 1.06   Urine Drug Screen: Drugs of Abuse  No results found for this basename: labopia,  cocainscrnur,  labbenz,  amphetmu,  thcu,  labbarb    Alcohol Level: No results found for this basename: ETH:2 in the last 72 hours Urinalysis: No results found for this basename: COLORURINE:2,APPERANCEUR:2,LABSPEC:2,PHURINE:2,GLUCOSEU:2,HGBUR:2,BILIRUBINUR:2,KETONESUR:2,PROTEINUR:2,UROBILINOGEN:2,NITRITE:2,LEUKOCYTESUR:2 in the last 72 hours  No results found for this or any previous visit (from the past 240 hour(s)).  Studies/Results: No results found.  Medications: Scheduled Meds:    . albuterol  2.5 mg Nebulization TID  . antiseptic oral rinse  15 mL Mouth Rinse BID  . buPROPion  150 mg Oral Daily  . furosemide  40 mg Oral Daily  . HYDROcodone-acetaminophen  1 tablet Oral Once  . influenza  inactive virus vaccine  0.5 mL Intramuscular Once  . levofloxacin  500 mg Oral Daily  . metoprolol  50 mg Oral BID  . mirabegron ER  50 mg Oral Daily  . potassium chloride  20 mEq Oral BID  . rivaroxaban  15 mg Oral BID  . simvastatin  20 mg Oral q1800  . sodium chloride  3 mL Intravenous Q12H  . venlafaxine XR  150 mg Oral Daily   Continuous Infusions:  PRN Meds:.sodium chloride, acetaminophen, albuterol,  HYDROcodone-acetaminophen, sodium chloride  Assessment/Plan: 1. Bilateral  PE Transitioned to xarelto 9/29 Will ask CM to review co-pay Clinically stable and improved  2. COPD with mild exacerbation Improved, strep pneumo ag positive Transition to oral levaquin, continue for 4 more days Continue nebs Wean O2  3. Chronic diastolic CHF: compensated Continue PO lasix, metoprolol  4. DVT prophylaxis: on therapeutic anticoagulation  DNR/  Ambulate /PT/OT Home tomorrow   LOS: 3 days   Raven Gray [facility] Pager: [phone] 06/21/2012, 11:02 AM

## 2012-06-21 NOTE — Progress Notes (Signed)
Utilization review completed.  

## 2012-06-22 ENCOUNTER — Other Ambulatory Visit: Payer: Medicare Other

## 2012-06-22 MED ORDER — POLYETHYLENE GLYCOL 3350 17 G PO PACK
17.0000 g | PACK | Freq: Every day | ORAL | Status: DC
  Administered 2012-06-22: 17 g via ORAL
  Filled 2012-06-22: qty 1

## 2012-06-22 MED ORDER — POTASSIUM CHLORIDE 20 MEQ/15ML (10%) PO LIQD: 20.0000 meq | Freq: Two times a day (BID) | ORAL | Status: DC

## 2012-06-22 MED ORDER — RIVAROXABAN 15 MG PO TABS: 15.0000 mg | ORAL_TABLET | Freq: Two times a day (BID) | ORAL | Status: DC

## 2012-06-22 MED ORDER — LEVOFLOXACIN 500 MG PO TABS: 500.0000 mg | ORAL_TABLET | Freq: Every day | ORAL | Status: DC

## 2012-06-22 NOTE — Progress Notes (Addendum)
Pt has CHF/COPD, medically necessary for pt to be on 2 L of O2 at home upon DC.

## 2012-06-22 NOTE — Progress Notes (Signed)
Pt stated they received flu and pneumonia vaccine this admission.

## 2012-06-22 NOTE — Clinical Documentation Improvement (Signed)
RESPIRATORY FAILURE DOCUMENTATION CLARIFICATION QUERY   THIS DOCUMENT IS NOT A PERMANENT PART OF THE MEDICAL RECORD   Please update your documentation within the medical record to reflect your response to this query.                                                                                     06/22/12  Dr. Jomarie Longs and/or Associates,  In a better effort to capture your patient's severity of illness, reflect appropriate length of stay and utilization of resources, a review of the patient medical record has revealed the following indicators:  "78 y.o. female in no acute distress accompanied by daughter with past medical history significant for CHF and COPD complaining of worsening DOE x7 days" Patient is saturating in the mid-80s on room air oxygen responsive increasing to 95% on nasal cannula at 4 L per minute." Wynetta Emery, PA-C  06/18/12 2328  Respirations in ED High Teens to Mid 20's  "Past medical history of COPD Presented with 1 week hx of shortness of breath, nausea, weak all over, tremor worse with use of inhalers. She is not on oxygen at home. Some cough. Lungs:no wheezes but poor air movement, occasional crackles  Hypoxia - likely multifactorial we'll make sure she is on oxygen" DOUTOVA,ANASTASSIA  06/19/2012, 12:11 AM    Based on your clinical judgment, please document in the progress notes and discharge summary if a condition below provides greater specificity regarding the patient's respiratory status on admission:   - Acute Respiratory Failure, resolved   - Other Condition   - Unable to Clinically Determine   In responding to this query please exercise your independent judgment.    The fact that a query is asked, does not imply that any particular answer is desired or expected.   Reviewed: 06/27/12 - query not addressed - [email or web address].  Mathis Dad RN  Thank You,  Jerral Ralph  RN BSN CCDS Certified Clinical Documentation Specialist: Cell    [phone]  Health Information Management [place]   TO RESPOND TO THE THIS QUERY, FOLLOW THE INSTRUCTIONS BELOW:  1. If needed, update documentation for the patient's encounter via the notes activity.  2. Access this query again and click edit on the In [employer].  3. After updating, or not, click F2 to complete all highlighted (required) fields concerning your review. Select "additional documentation in the medical record" OR "no additional documentation provided".  4. Click Sign note button.  5. The deficiency will fall out of your In Basket *Please let us know if you are not able to complete this workflow by phone or e-mail (listed below).

## 2012-06-22 NOTE — Progress Notes (Signed)
Notified Dr. Jomarie Longs of pt not having a BM since the 27th of September.  Order was written for miralax, will carry out MD orders.

## 2012-06-22 NOTE — Progress Notes (Signed)
Pt given DC instructions and pt verbalized understanding. Pt DC home via w/c.  

## 2012-07-01 NOTE — Discharge Summary (Addendum)
Physician Discharge Summary  Patient ID: [name] MRN: [id number] DOB/AGE: 76-Nov-1929 76 y.o.  Admit date: 06/18/2012 Discharge date: 07/01/2012  Primary Care Physician: dr.Kohut  Disposition and Follow-up:  Dr.Kohut in 1 week Home health care services  Discharge Diagnoses:     Bilateral pulmonary emboli  Hypokalemia  COPD with mild exacerbation  Chronic Diastolic CHF  Hypoxia  Obesity  Mild cognitive decline/ early dementia      Medication List     As of 07/01/2012  3:31 PM    STOP taking these medications         potassium chloride SA 20 MEQ tablet   Commonly known as: K-DUR,KLOR-CON      TAKE these medications                     buPROPion 150 MG 24 hr tablet   Commonly known as: WELLBUTRIN XL   Take 150 mg by mouth daily.      furosemide 40 MG tablet   Commonly known as: LASIX   Take 40 mg by mouth daily.      levofloxacin 500 MG tablet   Commonly known as: LEVAQUIN   Take 1 tablet (500 mg total) by mouth daily. For 3 days      metoprolol 50 MG tablet   Commonly known as: LOPRESSOR   Take 50 mg by mouth 2 (two) times daily.      mirabegron ER 50 MG Tb24   Commonly known as: MYRBETRIQ   Take 50 mg by mouth daily.      potassium chloride 20 MEQ/15ML (10%) solution   Take 15 mLs (20 mEq total) by mouth 2 (two) times daily.      pravastatin 40 MG tablet   Commonly known as: PRAVACHOL   Take 40 mg by mouth every evening.      Rivaroxaban 15 MG Tabs tablet   Commonly known as: XARELTO   Take 1 tablet (15 mg total) by mouth 2 (two) times daily. For 3 weeks , then 20mg  daily for 3-6 months atleast      tiotropium 18 MCG inhalation capsule   Commonly known as: SPIRIVA   Place 18 mcg into inhaler and inhale daily.      venlafaxine XR 150 MG 24 hr capsule   Commonly known as: EFFEXOR-XR   Take 150 mg by mouth daily.       Significant Diagnostic Studies:   CT chest IMPRESSION: Bilateral segmental/subsegmental pulmonary emboli, as  described above.   Brief H and P: Raven Gray is a 78 y.o. female has a past medical history of Hypertension and COPD (chronic obstructive pulmonary disease).  Presented with 1 week hx of shortness of breath, nausea, weak all over, tremor worse with use of inhalers. She is not on oxygen at home. Some cough. No chest pain no fever. She have had diarrhea started today. Have not been on antibiotics.  She has been on lasix for a while but recently it has been increased.     Hospital Course:  1. Bilateral PE  Clinically improved, originally treated with SQ Lovenox then Transitioned to xarelto 9/29  Clinically stable and improved  Will Continue Xarelto 15mg  BID for 3 weeks then 20mg  daily for 3-47months atleast  2. COPD with mild exacerbation  Improved, strep pneumo ag positive  Transition to oral levaquin, continue for 4 more days  Continue nebs  Wean O2   3. Chronic diastolic CHF: compensated  Continue PO lasix, metoprolol  4. Mild cognitive decline/Early dementia: will need Neuro-psych eval as outpatient      Time spent on Discharge:  Signed: Ciaira Natividad [facility]  07/01/2012, 3:31 PM

## 2012-07-20 ENCOUNTER — Ambulatory Visit
Admission: RE | Admit: 2012-07-20 | Discharge: 2012-07-20 | Disposition: A | Discharge status: Home / Self Care | Payer: Medicare Other | Source: Ambulatory Visit | Attending: Endocrinology | Admitting: Endocrinology

## 2012-07-20 DIAGNOSIS — Z1231 Encounter for screening mammogram for malignant neoplasm of breast: Secondary | ICD-10-CM

## 2012-07-22 ENCOUNTER — Other Ambulatory Visit: Payer: Self-pay | Admitting: Endocrinology

## 2012-07-22 DIAGNOSIS — R928 Other abnormal and inconclusive findings on diagnostic imaging of breast: Secondary | ICD-10-CM

## 2012-07-26 ENCOUNTER — Ambulatory Visit
Admission: RE | Admit: 2012-07-26 | Discharge: 2012-07-26 | Disposition: A | Discharge status: Home / Self Care | Payer: Medicare Other | Source: Ambulatory Visit | Attending: Endocrinology | Admitting: Endocrinology

## 2012-07-26 DIAGNOSIS — R928 Other abnormal and inconclusive findings on diagnostic imaging of breast: Secondary | ICD-10-CM

## 2012-12-01 ENCOUNTER — Other Ambulatory Visit: Payer: Self-pay | Admitting: Endocrinology

## 2012-12-01 DIAGNOSIS — R9389 Abnormal findings on diagnostic imaging of other specified body structures: Secondary | ICD-10-CM

## 2012-12-07 ENCOUNTER — Ambulatory Visit
Admission: RE | Admit: 2012-12-07 | Discharge: 2012-12-07 | Disposition: A | Discharge status: Home / Self Care | Payer: Medicare Other | Source: Ambulatory Visit | Attending: Endocrinology | Admitting: Endocrinology

## 2012-12-07 DIAGNOSIS — R9389 Abnormal findings on diagnostic imaging of other specified body structures: Secondary | ICD-10-CM

## 2012-12-07 MED ORDER — IOHEXOL 300 MG/ML  SOLN
75.0000 mL | Freq: Once | INTRAMUSCULAR | Status: AC | PRN
  Administered 2012-12-07: 75 mL via INTRAVENOUS

## 2013-03-04 ENCOUNTER — Other Ambulatory Visit: Payer: Self-pay | Admitting: Endocrinology

## 2013-03-04 ENCOUNTER — Ambulatory Visit
Admission: RE | Admit: 2013-03-04 | Discharge: 2013-03-04 | Disposition: A | Discharge status: Home / Self Care | Payer: Medicare Other | Source: Ambulatory Visit | Attending: Endocrinology | Admitting: Endocrinology

## 2013-03-04 DIAGNOSIS — R609 Edema, unspecified: Secondary | ICD-10-CM

## 2013-06-09 ENCOUNTER — Other Ambulatory Visit: Payer: Self-pay

## 2013-06-09 ENCOUNTER — Other Ambulatory Visit: Payer: Self-pay | Admitting: Endocrinology

## 2013-06-09 DIAGNOSIS — Z1231 Encounter for screening mammogram for malignant neoplasm of breast: Secondary | ICD-10-CM

## 2013-07-22 ENCOUNTER — Ambulatory Visit
Admission: RE | Admit: 2013-07-22 | Discharge: 2013-07-22 | Disposition: A | Discharge status: Home / Self Care | Payer: Medicare Other | Source: Ambulatory Visit

## 2013-07-22 DIAGNOSIS — Z1231 Encounter for screening mammogram for malignant neoplasm of breast: Secondary | ICD-10-CM

## 2013-11-16 ENCOUNTER — Other Ambulatory Visit: Payer: Self-pay | Admitting: Endocrinology

## 2013-11-16 DIAGNOSIS — N644 Mastodynia: Secondary | ICD-10-CM

## 2013-11-21 ENCOUNTER — Ambulatory Visit (INDEPENDENT_AMBULATORY_CARE_PROVIDER_SITE_OTHER): Payer: Medicare Other | Admitting: Cardiovascular Disease

## 2013-11-21 ENCOUNTER — Encounter: Payer: Self-pay | Admitting: Cardiovascular Disease

## 2013-11-21 VITALS — BP 132/90 | HR 58 | Ht 63.0 in | Wt 190.4 lb

## 2013-11-21 DIAGNOSIS — I2699 Other pulmonary embolism without acute cor pulmonale: Secondary | ICD-10-CM

## 2013-11-21 DIAGNOSIS — I4891 Unspecified atrial fibrillation: Secondary | ICD-10-CM | POA: Insufficient documentation

## 2013-11-21 DIAGNOSIS — R079 Chest pain, unspecified: Secondary | ICD-10-CM | POA: Insufficient documentation

## 2013-11-21 NOTE — Patient Instructions (Signed)
  We will see you back in follow up after the tests.  Dr Gwenlyn Found has ordered an echocardiogram and lexiscan myoview

## 2013-11-21 NOTE — Assessment & Plan Note (Signed)
Patient has new onset chest pain in the last month which has awakened her from sleep it occurs almost on a daily basis she does have new-onset A. Fib release recently recognized. She has EKG changes as well. Under the care of a pharmacologic Myoview stress test to rule out an ischemic etiology.

## 2013-11-21 NOTE — Assessment & Plan Note (Signed)
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

## 2013-11-21 NOTE — Progress Notes (Signed)
11/21/2013 Raven Gray   1928/08/03  [id number]  Primary Physician [name], MD Primary Cardiologist: Lorretta Harp MD Renae Gloss   HPI: Ms Ticer is an 80 year old moderately overweight married Caucasian female mother of 4 daughters, grandmother to 60 grandchildren was accompanied by one of her daughters today. She was referred by Dr. Wilson Singer for new-onset A. Fib and chest pain. The patient has a remote history of a pulmonary embolism and DVT on Xarelto oral autoregulation. Problems include hypertension, hyperlipidemia and COPD. She does have a family history of heart disease in the father who died of an MI. She does have a daughter who's had stents. She has never had a heart attack or stroke. Over the last month she said it was a chest pain which awakened her from sleep. She also feels weak and sluggish. An EKG performed by her primary care physician revealed atrial fibrillation which was newly recognized.   Current Outpatient Prescriptions  Medication Sig Dispense Refill  . buPROPion (WELLBUTRIN XL) 150 MG 24 hr tablet Take 150 mg by mouth daily.      . furosemide (LASIX) 40 MG tablet Take 40 mg by mouth daily.      . metoprolol (LOPRESSOR) 50 MG tablet Take 50 mg by mouth 2 (two) times daily.      . mirabegron ER (MYRBETRIQ) 50 MG TB24 Take 50 mg by mouth daily.      . potassium chloride SA (K-DUR,KLOR-CON) 20 MEQ tablet Take 20 mEq by mouth daily.      . pravastatin (PRAVACHOL) 40 MG tablet Take 40 mg by mouth every evening.       . Rivaroxaban (XARELTO) 20 MG TABS tablet Take 20 mg by mouth daily with supper.      . tiotropium (SPIRIVA) 18 MCG inhalation capsule Place 18 mcg into inhaler and inhale daily.      Marland Kitchen venlafaxine XR (EFFEXOR-XR) 150 MG 24 hr capsule Take 150 mg by mouth daily.      Marland Kitchen zolpidem (AMBIEN) 5 MG tablet Take 5 mg by mouth at bedtime.       No current facility-administered medications for this visit.    Allergies  Allergen Reactions  .  Prednisone Other (See Comments)    Reaction-abnormal behavior "makes me feel like I'm flying"    History   Social History  . Marital Status: Widowed    Spouse Name: N/A    Number of Children: N/A  . Years of Education: N/A   Occupational History  . Not on file.   Social History Main Topics  . Smoking status: Never Smoker   . Smokeless tobacco: Not on file  . Alcohol Use: No  . Drug Use: No  . Sexual Activity: No   Other Topics Concern  . Not on file   Social History Narrative  . No narrative on file     Review of Systems: General: negative for chills, fever, night sweats or weight changes.  Cardiovascular: negative for chest pain, dyspnea on exertion, edema, orthopnea, palpitations, paroxysmal nocturnal dyspnea or shortness of breath Dermatological: negative for rash Respiratory: negative for cough or wheezing Urologic: negative for hematuria Abdominal: negative for nausea, vomiting, diarrhea, bright red blood per rectum, melena, or hematemesis Neurologic: negative for visual changes, syncope, or dizziness All other systems reviewed and are otherwise negative except as noted above.    Blood pressure 132/90, pulse 58, height 5\' 3"  (1.6 m), weight 86.365 kg (190 lb 6.4 oz).  General appearance:  alert and no distress Neck: no adenopathy, no carotid bruit, no JVD, supple, symmetrical, trachea midline and thyroid not enlarged, symmetric, no tenderness/mass/nodules Lungs: clear to auscultation bilaterally Heart: irregularly irregular rhythm Abdomen: soft, non-tender; bowel sounds normal; no masses,  no organomegaly Extremities: extremities normal, atraumatic, no cyanosis or edema and 1+ pedal pulses bilaterally  EKG atrial fibrillation with a ventricular response of 58. She has left ventricular hypertrophy with repolarization changes.  ASSESSMENT AND PLAN:   Atrial fibrillation Patient has a recently recognized atrial fibrillation by 12-lead EKG performed by her  primary care physician. Her last documented EKG in sinus rhythm appears to be in 2011. She is already on Xarelto oral anticoagulation for prior DVT and pulmonary embolism. She is rate controlled. She has inferolateral ST segment depression. She is complaining of being weak and sluggish. I'm going to obtain a 2-D echocardiogram for LV function and valvular function as well. At this point I'm going to keep her in A. Fib, rate controlled on oral anticoagulation.  Chest pain Patient has new onset chest pain in the last month which has awakened her from sleep it occurs almost on a daily basis she does have new-onset A. Fib release recently recognized. She has EKG changes as well. Under the care of a pharmacologic Myoview stress test to rule out an ischemic etiology.  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      Lorretta Harp MD FACP,FACC,FAHA, [facility] 11/21/2013 4:46 PM

## 2013-11-21 NOTE — Assessment & Plan Note (Signed)
Patient has a recently recognized atrial fibrillation by 12-lead EKG performed by her primary care physician. Her last documented EKG in sinus rhythm appears to be in 2011. She is already on Xarelto oral anticoagulation for prior DVT and pulmonary embolism. She is rate controlled. She has inferolateral ST segment depression. She is complaining of being weak and sluggish. I'm going to obtain a 2-D echocardiogram for LV function and valvular function as well. At this point I'm going to keep her in A. Fib, rate controlled on oral anticoagulation.

## 2013-11-23 ENCOUNTER — Ambulatory Visit (HOSPITAL_COMMUNITY)
Admission: RE | Admit: 2013-11-23 | Discharge: 2013-11-23 | Disposition: A | Discharge status: Home / Self Care | Payer: Medicare Other | Source: Ambulatory Visit | Attending: Cardiovascular Disease | Admitting: Cardiovascular Disease

## 2013-11-23 DIAGNOSIS — I359 Nonrheumatic aortic valve disorder, unspecified: Secondary | ICD-10-CM

## 2013-11-23 DIAGNOSIS — I4891 Unspecified atrial fibrillation: Secondary | ICD-10-CM | POA: Insufficient documentation

## 2013-11-23 DIAGNOSIS — R079 Chest pain, unspecified: Secondary | ICD-10-CM | POA: Insufficient documentation

## 2013-11-28 ENCOUNTER — Ambulatory Visit
Admission: RE | Admit: 2013-11-28 | Discharge: 2013-11-28 | Disposition: A | Discharge status: Home / Self Care | Payer: Medicare Other | Source: Ambulatory Visit | Attending: Endocrinology | Admitting: Endocrinology

## 2013-11-28 ENCOUNTER — Encounter: Payer: Self-pay | Admitting: *Deleted

## 2013-11-28 DIAGNOSIS — N644 Mastodynia: Secondary | ICD-10-CM

## 2013-11-30 ENCOUNTER — Encounter: Payer: Self-pay | Admitting: Cardiovascular Disease

## 2013-12-01 ENCOUNTER — Telehealth (HOSPITAL_COMMUNITY): Payer: Self-pay

## 2013-12-06 ENCOUNTER — Ambulatory Visit (HOSPITAL_COMMUNITY)
Admission: RE | Admit: 2013-12-06 | Discharge: 2013-12-06 | Disposition: A | Discharge status: Home / Self Care | Payer: Medicare Other | Source: Ambulatory Visit | Attending: Cardiovascular Disease | Admitting: Cardiovascular Disease

## 2013-12-06 DIAGNOSIS — R079 Chest pain, unspecified: Secondary | ICD-10-CM

## 2013-12-06 DIAGNOSIS — I4891 Unspecified atrial fibrillation: Secondary | ICD-10-CM

## 2013-12-06 MED ORDER — REGADENOSON 0.4 MG/5ML IV SOLN
0.4000 mg | Freq: Once | INTRAVENOUS | Status: AC
  Administered 2013-12-06: 0.4 mg via INTRAVENOUS

## 2013-12-06 MED ORDER — TECHNETIUM TC 99M SESTAMIBI GENERIC - CARDIOLITE
10.4000 | Freq: Once | INTRAVENOUS | Status: AC | PRN
  Administered 2013-12-06: 10 via INTRAVENOUS

## 2013-12-06 MED ORDER — TECHNETIUM TC 99M SESTAMIBI GENERIC - CARDIOLITE
31.0000 | Freq: Once | INTRAVENOUS | Status: AC | PRN
  Administered 2013-12-06: 31 via INTRAVENOUS

## 2013-12-06 MED ORDER — AMINOPHYLLINE 25 MG/ML IV SOLN
75.0000 mg | Freq: Once | INTRAVENOUS | Status: AC
  Administered 2013-12-06: 75 mg via INTRAVENOUS

## 2013-12-06 NOTE — Procedures (Addendum)
[place] [address] [place] [address] [phone]  Cardiology Nuclear Med Study  Raven Gray is a 81 y.o. female     MRN : [id number]     DOB: [birth year]  Procedure Date: 12/06/2013  Nuclear Med Background Indication for Stress Test:  Stent Patency and Abnormal EKG History:  COPD and CAD;STENT/PTCA--09/2009;PACER;NSVT;AFIB;PAT;CHF;2'AVB Cardiac Risk Factors: Carotid Disease, Family History - CAD, Hypertension, Lipids, Obesity, PVD and AAA;PE;DVT  Symptoms:  Chest Pain, Dizziness, DOE, Fatigue, Light-Headedness, SOB and WEAKNESS   Nuclear Pre-Procedure Caffeine/Decaff Intake:  1:00am NPO After: 11am   IV Site: R Forearm  IV 0.9% NS with Angio Cath:  22g  Chest Size (in):  n/a IV Started by: Azucena Cecil, RN  Height: 5\' 3"  (1.6 m)  Cup Size: D  BMI:  Body mass index is 33.67 kg/(m^2). Weight:  190 lb (86.183 kg)   Tech Comments:  n/a    Nuclear Med Study 1 or 2 day study: 1 day  Stress Test Type:  [place] Provider:  Quay Burow, MD   Resting Radionuclide: Technetium 35m Sestamibi  Resting Radionuclide Dose: 10.4 mCi   Stress Radionuclide:  Technetium 77m Sestamibi  Stress Radionuclide Dose: 31.0 mCi           Stress Protocol Rest HR: 67 Stress HR: 88  Rest BP: 151/96 Stress BP: 159/103  Exercise Time (min): n/a METS: n/a          Dose of Adenosine (mg):  n/a Dose of Lexiscan: 0.4 mg  Dose of Atropine (mg): n/a Dose of Dobutamine: n/a mcg/kg/min (at max HR)  Stress Test Technologist: Mellody Memos, CCT Nuclear Technologist: Imagene Riches, CNMT   Rest Procedure:  Myocardial perfusion imaging was performed at rest 45 minutes following the intravenous administration of Technetium 1m Sestamibi. Stress Procedure:  The patient received IV Lexiscan 0.4 mg over 15-seconds.  Technetium 46m Sestamibi injected at 30-seconds.  Due to patient's shortness of breath and weakness, she was given IV  Aminophylline 75 mg. Symptoms were resolved during recovery. There were no significant changes with Lexiscan.  Quantitative spect images were obtained after a 45 minute delay.  Transient Ischemic Dilatation (Normal <1.22):  1.35 Lung/Heart Ratio (Normal <0.45):  0.30 QGS EDV:  n/a ml QGS ESV:  n/a ml LV Ejection Fraction: Study not gated        Rest ECG: Atrial Fibrilliation and With inferolateral ST depression  Stress ECG: No significant change from baseline ECG  QPS Raw Data Images:  Normal; no motion artifact; normal heart/lung ratio. Stress Images:  Normal homogeneous uptake in all areas of the myocardium. Rest Images:  Normal homogeneous uptake in all areas of the myocardium. Subtraction (SDS):  No evidence of ischemia.  Impression Exercise Capacity:  Lexiscan with no exercise. BP Response:  Normal blood pressure response. Clinical Symptoms:  No significant symptoms noted. ECG Impression:  No significant ST segment change suggestive of ischemia. Comparison with Prior Nuclear Study: No previous nuclear study performed  Overall Impression:  Normal stress nuclear study.  LV Wall Motion:  Not gated because of AFIB   Lorretta Harp, MD  12/06/2013 5:11 PM

## 2013-12-11 ENCOUNTER — Encounter: Payer: Self-pay | Admitting: *Deleted

## 2013-12-19 ENCOUNTER — Telehealth: Payer: Self-pay | Admitting: *Deleted

## 2013-12-19 NOTE — Telephone Encounter (Signed)
Returned call and pt verified x 2 w/ [name], pt's daughter.  Stated they received the test results in the mail on Saturday and wanted to know if it were necessary to come in.  Stated they letter said the tests were normal and if there isn't a need for the pt to pay a $50 copay for results and she already has them, then she wants to cancel the appt.  Informed both Dr. Gwenlyn Found and Curt Bears, RN are out of the office today.  Informed it appears the appt was for f/u of test results only and if pt does not have any questions about the results and her condition, then she can cancel.  Daughter stated they are fine with results and will cancel.  Stated she will call back if pt needs an appt.  Appt canceled for tomorrow w/ Dr. Gwenlyn Found.

## 2013-12-19 NOTE — Telephone Encounter (Signed)
Pt's daughter called and asked if they needed to come in since they received her test results and everything was ok.   JB

## 2013-12-20 ENCOUNTER — Ambulatory Visit: Payer: Medicare Other | Admitting: Cardiovascular Disease

## 2014-04-06 NOTE — Telephone Encounter (Signed)
Encounter complete. 

## 2014-04-24 ENCOUNTER — Telehealth: Payer: Self-pay | Admitting: Cardiovascular Disease

## 2014-04-25 NOTE — Telephone Encounter (Signed)
Closed encounter °

## 2014-06-19 ENCOUNTER — Emergency Department (HOSPITAL_COMMUNITY)
Admission: EM | Admit: 2014-06-19 | Discharge: 2014-06-19 | Disposition: A | Discharge status: Home / Self Care | Payer: Medicare Other | Attending: Emergency Medicine | Admitting: Emergency Medicine

## 2014-06-19 ENCOUNTER — Encounter (HOSPITAL_COMMUNITY): Payer: Self-pay | Admitting: Emergency Medicine

## 2014-06-19 ENCOUNTER — Emergency Department (HOSPITAL_COMMUNITY): Payer: Medicare Other

## 2014-06-19 DIAGNOSIS — I4891 Unspecified atrial fibrillation: Secondary | ICD-10-CM | POA: Insufficient documentation

## 2014-06-19 DIAGNOSIS — I10 Essential (primary) hypertension: Secondary | ICD-10-CM | POA: Insufficient documentation

## 2014-06-19 DIAGNOSIS — Z86718 Personal history of other venous thrombosis and embolism: Secondary | ICD-10-CM | POA: Diagnosis not present

## 2014-06-19 DIAGNOSIS — R55 Syncope and collapse: Secondary | ICD-10-CM

## 2014-06-19 DIAGNOSIS — I499 Cardiac arrhythmia, unspecified: Secondary | ICD-10-CM | POA: Insufficient documentation

## 2014-06-19 DIAGNOSIS — E785 Hyperlipidemia, unspecified: Secondary | ICD-10-CM | POA: Diagnosis not present

## 2014-06-19 DIAGNOSIS — J4489 Other specified chronic obstructive pulmonary disease: Secondary | ICD-10-CM | POA: Insufficient documentation

## 2014-06-19 DIAGNOSIS — Z7901 Long term (current) use of anticoagulants: Secondary | ICD-10-CM | POA: Insufficient documentation

## 2014-06-19 DIAGNOSIS — Z79899 Other long term (current) drug therapy: Secondary | ICD-10-CM | POA: Insufficient documentation

## 2014-06-19 DIAGNOSIS — Z86711 Personal history of pulmonary embolism: Secondary | ICD-10-CM | POA: Diagnosis not present

## 2014-06-19 DIAGNOSIS — J449 Chronic obstructive pulmonary disease, unspecified: Secondary | ICD-10-CM | POA: Insufficient documentation

## 2014-06-19 DIAGNOSIS — R609 Edema, unspecified: Secondary | ICD-10-CM | POA: Diagnosis not present

## 2014-06-19 DIAGNOSIS — I951 Orthostatic hypotension: Secondary | ICD-10-CM | POA: Diagnosis not present

## 2014-06-19 LAB — COMPREHENSIVE METABOLIC PANEL
ALT: 13 U/L (ref 0–35)
AST: 21 U/L (ref 0–37)
Albumin: 3.5 g/dL (ref 3.5–5.2)
Alkaline Phosphatase: 83 U/L (ref 39–117)
Anion gap: 14 (ref 5–15)
BUN: 17 mg/dL (ref 6–23)
CO2: 24 mEq/L (ref 19–32)
Calcium: 8.8 mg/dL (ref 8.4–10.5)
Chloride: 102 mEq/L (ref 96–112)
Creatinine, Ser: 1.3 mg/dL — ABNORMAL HIGH (ref 0.50–1.10)
GFR calc Af Amer: 42 mL/min — ABNORMAL LOW (ref >90)
GFR calc non Af Amer: 37 mL/min — ABNORMAL LOW (ref >90)
Glucose, Bld: 89 mg/dL (ref 70–99)
Potassium: 3.7 mEq/L (ref 3.7–5.3)
Sodium: 140 mEq/L (ref 137–147)
Total Bilirubin: 0.6 mg/dL (ref 0.3–1.2)
Total Protein: 6.6 g/dL (ref 6.0–8.3)

## 2014-06-19 LAB — PROTIME-INR
INR: 1.49 (ref 0.00–1.49)
PROTHROMBIN TIME: 18 s — AB (ref 11.6–15.2)

## 2014-06-19 LAB — CBC
HEMATOCRIT: 46.4 % — AB (ref 36.0–46.0)
Hemoglobin: 14.9 g/dL (ref 12.0–15.0)
MCH: 28.8 pg (ref 26.0–34.0)
MCHC: 32.1 g/dL (ref 30.0–36.0)
MCV: 89.7 fL (ref 78.0–100.0)
Platelets: 182 10*3/uL (ref 150–400)
RBC: 5.17 MIL/uL — ABNORMAL HIGH (ref 3.87–5.11)
RDW: 14.1 % (ref 11.5–15.5)
WBC: 8.1 10*3/uL (ref 4.0–10.5)

## 2014-06-19 LAB — URINALYSIS, ROUTINE W REFLEX MICROSCOPIC
Bilirubin Urine: NEGATIVE
Glucose, UA: NEGATIVE mg/dL
Hgb urine dipstick: NEGATIVE
Ketones, ur: NEGATIVE mg/dL
Leukocytes, UA: NEGATIVE
Nitrite: NEGATIVE
Protein, ur: NEGATIVE mg/dL
Specific Gravity, Urine: 1.01 (ref 1.005–1.030)
Urobilinogen, UA: 0.2 mg/dL (ref 0.0–1.0)
pH: 5 (ref 5.0–8.0)

## 2014-06-19 LAB — TROPONIN I: Troponin I: <0.3 ng/mL (ref <0.30)

## 2014-06-19 MED ORDER — SODIUM CHLORIDE 0.9 % IV SOLN
INTRAVENOUS | Status: DC
  Administered 2014-06-19: 1000 mL via INTRAVENOUS

## 2014-06-19 MED ORDER — SODIUM CHLORIDE 0.9 % IV BOLUS (SEPSIS): 1000.0000 mL | Freq: Once | INTRAVENOUS | Status: DC

## 2014-06-19 MED ORDER — SODIUM CHLORIDE 0.9 % IV BOLUS (SEPSIS)
1000.0000 mL | Freq: Once | INTRAVENOUS | Status: AC
  Administered 2014-06-19: 1000 mL via INTRAVENOUS

## 2014-06-19 NOTE — ED Notes (Signed)
Patient is resting comfortably. 

## 2014-06-19 NOTE — ED Notes (Signed)
Pt presents from PCP office via [facility] with c/o near syncopal episode and bradycardia. Pt was at her PCP awaiting a flu shot and began feeling hot, diaphoretic and flushed. PCP contacted EMS.   Upon EMS arrival, HR was 40-70 and in Afib (Pt has hx of the same) and BP was 84/58. Most recent pulse was 70 and BP 136/60. CBG99.  Pt is A&Ox4 and in NAD.

## 2014-06-19 NOTE — ED Notes (Signed)
Did in and out cath on patient yellow urine in return

## 2014-06-19 NOTE — ED Provider Notes (Signed)
CSN: [id number]     Arrival date & time 06/19/14  1224 History   First MD Initiated Contact with Patient 06/19/14 1230     Chief Complaint  Patient presents with  . Near Syncope  . Bradycardia     (Consider location/radiation/quality/duration/timing/severity/associated sxs/prior Treatment) HPI  Patient with hx HTN, Afib, PE/DVT, COPD, HL, on Xarelto presents with near syncope while sitting at doctor's office today.  Pt states she suddenly became very hot, and per patient's daugther simultaneously became clammy, diaphoretic, and alternating pale/red faced.  [place] office staff attempted to check blood pressure several times wtihout result finally finding her to be hypotensive, systolic in the 32G.  Pt notes she was lightheaded and slightly SOB at the time.  The episode lasted approximately 30 minutes.  States she currently feels very well, back to normal, no concerns or complaints at present. Denies dizziness (spinning), CP, palpitations, numbness or tingling of the extremities.  Denies fevers, recent illness.   Past Medical History  Diagnosis Date  . Hypertension   . COPD (chronic obstructive pulmonary disease)   . Shortness of breath   . History of DVT (deep vein thrombosis)   . History of pulmonary embolism   . Chest pain   . Atrial fibrillation   . Hyperlipidemia    Past Surgical History  Procedure Laterality Date  . Replacement total knee     Family History  Problem Relation Age of Onset  . Diabetes type II Daughter    History  Substance Use Topics  . Smoking status: Never Smoker   . Smokeless tobacco: Not on file  . Alcohol Use: No   OB History   Grav Para Term Preterm Abortions TAB SAB Ect Mult Living                 Review of Systems  All other systems reviewed and are negative.     Allergies  Prednisone  Home Medications   Prior to Admission medications   Medication Sig Start Date End Date Taking? Authorizing Provider  buPROPion (WELLBUTRIN XL) 150  MG 24 hr tablet Take 150 mg by mouth every other day.    Yes Historical Provider, MD  furosemide (LASIX) 40 MG tablet Take 40 mg by mouth daily.   Yes Historical Provider, MD  metoprolol (LOPRESSOR) 50 MG tablet Take 50 mg by mouth 2 (two) times daily.   Yes Historical Provider, MD  mirabegron ER (MYRBETRIQ) 50 MG TB24 Take 50 mg by mouth daily.   Yes Historical Provider, MD  potassium chloride SA (K-DUR,KLOR-CON) 20 MEQ tablet Take 20 mEq by mouth daily.   Yes Historical Provider, MD  pravastatin (PRAVACHOL) 40 MG tablet Take 40 mg by mouth daily.    Yes Historical Provider, MD  Rivaroxaban (XARELTO) 20 MG TABS tablet Take 20 mg by mouth daily with supper.   Yes Historical Provider, MD  tiotropium (SPIRIVA) 18 MCG inhalation capsule Place 18 mcg into inhaler and inhale daily.   Yes Historical Provider, MD  venlafaxine XR (EFFEXOR-XR) 150 MG 24 hr capsule Take 150 mg by mouth daily.   Yes Historical Provider, MD  zolpidem (AMBIEN) 5 MG tablet Take 5 mg by mouth at bedtime. 11/03/13  Yes Historical Provider, MD   BP 121/57  Pulse 61  Temp(Src) 97.6 F (36.4 C) (Oral)  Resp 18  SpO2 94% Physical Exam  Nursing note and vitals reviewed. Constitutional: She appears well-developed and well-nourished. No distress.  HENT:  Head: Normocephalic and atraumatic.  Mouth/Throat: Uvula  is midline. Mucous membranes are dry.  Eyes: Conjunctivae are normal.  Neck: Normal range of motion. Neck supple.  Cardiovascular: Normal rate.  An irregular rhythm present.  Pulmonary/Chest: Effort normal and breath sounds normal. No respiratory distress. She has no wheezes. She has no rales.  Abdominal: Soft. She exhibits no distension. There is no tenderness. There is no rebound and no guarding.  Musculoskeletal: She exhibits edema.  Neurological: She is alert.  Skin: She is not diaphoretic.  Psychiatric: She has a normal mood and affect. Her behavior is normal. Thought content normal.    ED Course  Procedures  (including critical care time) Labs Review Labs Reviewed  CBC - Abnormal; Notable for the following:    RBC 5.17 (*)    HCT 46.4 (*)    All other components within normal limits  COMPREHENSIVE METABOLIC PANEL - Abnormal; Notable for the following:    Creatinine, Ser 1.30 (*)    GFR calc non Af Amer 37 (*)    GFR calc Af Amer 42 (*)    All other components within normal limits  PROTIME-INR - Abnormal; Notable for the following:    Prothrombin Time 18.0 (*)    All other components within normal limits  URINALYSIS, ROUTINE W REFLEX MICROSCOPIC  TROPONIN I    Imaging Review Dg Chest 2 View  06/19/2014   CLINICAL DATA:  Dizziness with sweating ; recent blood pressure medication change and feeling poorly sets  EXAM: CHEST  2 VIEW  COMPARISON:  PA and lateral chest of March 14, 2014  FINDINGS: The lungs are adequately inflated. There is no focal infiltrate. The heart is top-normal in size. The pulmonary vascularity is not engorged. There is a large hiatal hernia. There is no pleural effusion or pneumothorax. The bony thorax is unremarkable.  IMPRESSION: There is no acute cardiopulmonary abnormality. There is mild cardiomegaly which is stable. There is stable large hiatal hernia.   Electronically Signed   By: David  Martinique   On: 06/19/2014 14:14     EKG Interpretation   Date/Time:  Monday June 19 2014 12:27:30 EDT Ventricular Rate:  61 PR Interval:    QRS Duration: 105 QT Interval:  388 QTC Calculation: 391 R Axis:   78 Text Interpretation:  Atrial fibrillation Non-specific ST-t changes Poor  data quality `afib new, st/t appearance unchanged from prior Confirmed by  Ashok Cordia  MD, Lennette Bihari ([id number]) on 06/19/2014 12:33:59 PM      2:54 PM Discussed pt with Dr Ashok Cordia.  Will call cardiology for consultation.    3:03 PM Discussed pt with Dr Martinique (cardiology).  Recommends holding lasix today and reducing metoprolol to 25mg  BID from 50mg  BID.  Also recommends close follow up with Dr Gwenlyn Found  this week.   3:41 PM Pt continues to feel well.  She has a follow up appointment with Dr Gwenlyn Found (cardiology) tomorrow.   Discussed with Dr Ashok Cordia who agrees with plan for discharge home.   Patient and patient's daughter are both agreeable and comfortable with this plan, prefer discharge home.  Pt states she is feeling much better and does not feel lightheaded at all when she stands.   MDM   Final diagnoses:  Near syncope  Orthostatic hypotension    Afebrile, nontoxic patient with episode of near syncope while at the doctor's office - consisted of lightheadedness, diaphoresis, clamminess, mild SOB.  Lasted 30 minutes and resolved spontaneously.  Per EMS, HR ranged 40-70, rhythm strips not in chart.  Hypotensive in office.  Orthostatic  in ED.  HR remained in the 60-80s in ED. Discussed patient with cardiologist on call Dr Martinique who recommends temporary medication changes.  Pt has follow up tomorrow morning at 11am with Dr Gwenlyn Found (cardiology).  Orthostatics improved greatly with IVF.  After 1L IVF, pt continues to drop her BP upon standing but does not become hypotensive and is asymptomatic.  Pt and her daughter prefer d/c home. Pt will stay with her daughter tonight.  Advised to eat and drink well.  D/C home with medication changes as above, close cardiology follow up tomorrow.   Discussed result, findings, treatment, and follow up  with patient.  Pt given return precautions.  Pt verbalizes understanding and agrees with plan.         [place], PA-C 06/19/14 [phone]

## 2014-06-19 NOTE — ED Notes (Signed)
Pt comfortable with discharge and follow up instructions. No prescriptions. Medications discussed.

## 2014-06-19 NOTE — Discharge Instructions (Signed)
Read the information below.  You may return to the Emergency Department at any time for worsening condition or any new symptoms that concern you.  If you feel more lightheaded, dizzy, or weak, or if you develop chest pain or shortness of breath, call 911 or return to the Emergency Department for a recheck.    Please see Dr Gwenlyn Found in the office tomorrow morning as planned.  Prior to this, please eat and drink well, be careful when you get up and when you walk - if you feel weak or dizzy please sit down.  Do not take your furosemide (Lasix) until you speak with Dr Gwenlyn Found tomorrow.  Instead of your normal dose of metoprolol, please take half of your regular dose, which would be 25mg  by mouth twice daily.      Orthostatic Hypotension Orthostatic hypotension is a sudden drop in blood pressure. It happens when you quickly stand up from a seated or lying position. You may feel dizzy or light-headed. This can last for just a few seconds or for up to a few minutes. It is usually not a serious problem. However, if this happens frequently or gets worse, it can be a sign of something more serious. CAUSES  Different things can cause orthostatic hypotension, including:   Loss of body fluids (dehydration).  Medicines that lower blood pressure.  Sudden changes in posture, such as standing up quickly after you have been sitting or lying down.  Taking too much of your medicine. SIGNS AND SYMPTOMS   Light-headedness or dizziness.   Fainting or near-fainting.   A fast heart rate.   Weakness.   Feeling tired (fatigue).  DIAGNOSIS  Your health care provider may do several things to help diagnose your condition and identify the cause. These may include:   Taking a medical history and doing a physical exam.  Checking your blood pressure. Your health care provider will check your blood pressure when you are:  Lying down.  Sitting.  Standing.  Using tilt table testing. In this test, you lie down  on a table that moves from a lying position to a standing position. You will be strapped onto the table. This test monitors your blood pressure and heart rate when you are in different positions. TREATMENT  Treatment will vary depending on the cause. Possible treatments include:   Changing the dosage of your medicines.  Wearing compression stockings on your lower legs.  Standing up slowly after sitting or lying down.  Eating more salt.  Eating frequent, small meals.  In some cases, getting IV fluids.  Taking medicine to enhance fluid retention. HOME CARE INSTRUCTIONS  Only take over-the-counter or prescription medicines as directed by your health care provider.  Follow your health care provider's instructions for changing the dosage of your current medicines.  Do not stop or adjust your medicine on your own.  Stand up slowly after sitting or lying down. This allows your body to adjust to the different position.  Wear compression stockings as directed.  Eat extra salt as directed.  Do not add extra salt to your diet unless directed to by your health care provider.  Eat frequent, small meals.  Avoid standing suddenly after eating.  Avoid hot showers or excessive heat as directed by your health care provider.  Keep all follow-up appointments. SEEK MEDICAL CARE IF:  You continue to feel dizzy or light-headed after standing.  You feel groggy or confused.  You feel cold, clammy, or sick to your stomach (nauseous).  You have blurred vision.  You feel short of breath. SEEK IMMEDIATE MEDICAL CARE IF:   You faint after standing.  You have chest pain.  You have difficulty breathing.   You lose feeling or movement in your arms or legs.   You have slurred speech or difficulty talking, or you are unable to talk.  MAKE SURE YOU:   Understand these instructions.  Will watch your condition.  Will get help right away if you are not doing well or get  worse. Document Released: 08/29/2002 Document Revised: 09/13/2013 Document Reviewed: 07/01/2013 [facility] Patient Information 2015 [place], [place]. This information is not intended to replace advice given to you by your health care provider. Make sure you discuss any questions you have with your health care provider.  Near-Syncope Near-syncope (commonly known as near fainting) is sudden weakness, dizziness, or feeling like you might pass out. During an episode of near-syncope, you may also develop pale skin, have tunnel vision, or feel sick to your stomach (nauseous). Near-syncope may occur when getting up after sitting or while standing for a long time. It is caused by a sudden decrease in blood flow to the brain. This decrease can result from various causes or triggers, most of which are not serious. However, because near-syncope can sometimes be a sign of something serious, a medical evaluation is required. The specific cause is often not determined. HOME CARE INSTRUCTIONS  Monitor your condition for any changes. The following actions may help to alleviate any discomfort you are experiencing:  Have someone stay with you until you feel stable.  Lie down right away and prop your feet up if you start feeling like you might faint. Breathe deeply and steadily. Wait until all the symptoms have passed. Most of these episodes last only a few minutes. You may feel tired for several hours.   Drink enough fluids to keep your urine clear or pale yellow.   If you are taking blood pressure or heart medicine, get up slowly when seated or lying down. Take several minutes to sit and then stand. This can reduce dizziness.  Follow up with your health care provider as directed. SEEK IMMEDIATE MEDICAL CARE IF:   You have a severe headache.   You have unusual pain in the chest, abdomen, or back.   You are bleeding from the mouth or rectum, or you have black or tarry stool.   You have an irregular or very  fast heartbeat.   You have repeated fainting or have seizure-like jerking during an episode.   You faint when sitting or lying down.   You have confusion.   You have difficulty walking.   You have severe weakness.   You have vision problems.  MAKE SURE YOU:   Understand these instructions.  Will watch your condition.  Will get help right away if you are not doing well or get worse. Document Released: 09/08/2005 Document Revised: 09/13/2013 Document Reviewed: 02/11/2013 [facility] Patient Information 2015 [place], [place]. This information is not intended to replace advice given to you by your health care provider. Make sure you discuss any questions you have with your health care provider.

## 2014-06-20 ENCOUNTER — Ambulatory Visit (INDEPENDENT_AMBULATORY_CARE_PROVIDER_SITE_OTHER): Payer: Medicare Other | Admitting: Cardiovascular Disease

## 2014-06-20 ENCOUNTER — Encounter: Payer: Self-pay | Admitting: Cardiovascular Disease

## 2014-06-20 ENCOUNTER — Other Ambulatory Visit: Payer: Self-pay

## 2014-06-20 VITALS — BP 132/71 | HR 64 | Ht 63.0 in | Wt 184.0 lb

## 2014-06-20 DIAGNOSIS — I482 Chronic atrial fibrillation, unspecified: Secondary | ICD-10-CM

## 2014-06-20 DIAGNOSIS — I4891 Unspecified atrial fibrillation: Secondary | ICD-10-CM

## 2014-06-20 DIAGNOSIS — Z1231 Encounter for screening mammogram for malignant neoplasm of breast: Secondary | ICD-10-CM

## 2014-06-20 MED ORDER — METOPROLOL TARTRATE 25 MG PO TABS: 25.0000 mg | ORAL_TABLET | Freq: Two times a day (BID) | ORAL | Status: DC

## 2014-06-20 NOTE — Assessment & Plan Note (Signed)
History of atrial fibrillation rate controlled on Xarelto oral anticoagulation. She does have a normal 2-D echo as well as Myoview stress test. She denies chest pain or shortness of breath. She had a near syncopal episode while in her doctor's office yesterday with hypotension and heart rates documented in the 40s. She was seen in the emergency room and told to hold her beta blocker and diuretic. Her heart rates are in the 70s today. I told her to go back on half dose metoprolol, 25 mg by mouth twice a day.

## 2014-06-20 NOTE — Assessment & Plan Note (Signed)
History of remote pulmonary embolism and DVT on Xarelto oral anticoagulation

## 2014-06-20 NOTE — Patient Instructions (Signed)
We request that you follow-up in: 3 months with an extender and in 6 months with Dr Andria Rhein will receive a reminder letter in the mail two months in advance. If you don't receive a letter, please call our office to schedule the follow-up appointment.  Only take the lasix as needed.  When you need to take it only take 1/2 tablet.  Restart Metoprolol at 25mg  twice a day

## 2014-06-20 NOTE — ED Provider Notes (Signed)
Medical screening examination/treatment/procedure(s) were conducted as a shared visit with non-physician practitioner(s) and myself.  I personally evaluated the patient during the encounter.   EKG Interpretation   Date/Time:  Monday June 19 2014 12:27:30 EDT Ventricular Rate:  61 PR Interval:    QRS Duration: 105 QT Interval:  388 QTC Calculation: 391 R Axis:   78 Text Interpretation:  Atrial fibrillation Non-specific ST-t changes Poor  data quality `afib new, st/t appearance unchanged from prior Confirmed by  Ashok Cordia  MD, Lennette Bihari ([id number]) on 06/19/2014 12:33:59 PM      Pt with near syncopal event at doctors office.  Pt noted by ems to have afib, hx same, w hr in range 40-70.  Pt is on betablocker.  In ED, hr 60. Labs.  Cardiology consulted re bradycardia, near syncope, bblocker use.  They rec dec b blocker dose, d/c, and outpt f/u.   Mirna Mires, MD 06/20/14 1115

## 2014-06-20 NOTE — Progress Notes (Signed)
06/20/2014 Raven Gray   Feb 16, 1928  [id number]  Primary Physician [name], MD Primary Cardiologist: Lorretta Harp MD Renae Gloss   HPI:  [name] is an 80-year-old moderately overweight married Caucasian female mother of 4 daughters, grandmother to 39 grandchildren was accompanied by one of her daughters , [name], today. She was referred by Dr. Wilson Singer for new-onset A. Fib and chest pain. The patient has a remote history of a pulmonary embolism and DVT on Xarelto oral autoregulation. Problems include hypertension, hyperlipidemia and COPD. She does have a family history of heart disease in the father who died of an MI. She does have a daughter who's had stents. She has never had a heart attack or stroke. Over the last month she said it was a chest pain which awakened her from sleep. She also feels weak and sluggish. An EKG performed by her primary care physician revealed atrial fibrillation which was newly recognized. I performed a 2-D echo cardiogram which was normal as was a Myoview stress test. She had done fairly well until yesterday when she had a presyncopal episode while in her primary care physician's office. Her blood pressure was low and her heart rate was in the 40s. She was seen at [facility] emergency room where telemetry did not show pauses and she was told to hold her beta blocker and diuretic. She was fluid resuscitated.    Current Outpatient Prescriptions  Medication Sig Dispense Refill  . buPROPion (WELLBUTRIN XL) 150 MG 24 hr tablet Take 150 mg by mouth every other day.       . furosemide (LASIX) 40 MG tablet Take 40 mg by mouth daily.      . metoprolol (LOPRESSOR) 25 MG tablet Take 1 tablet (25 mg total) by mouth 2 (two) times daily.  180 tablet  3  . mirabegron ER (MYRBETRIQ) 50 MG TB24 Take 50 mg by mouth daily.      . potassium chloride SA (K-DUR,KLOR-CON) 20 MEQ tablet Take 20 mEq by mouth daily.      . pravastatin (PRAVACHOL) 40 MG tablet  Take 40 mg by mouth daily.       . Rivaroxaban (XARELTO) 20 MG TABS tablet Take 20 mg by mouth daily with supper.      . tiotropium (SPIRIVA) 18 MCG inhalation capsule Place 18 mcg into inhaler and inhale daily.      Marland Kitchen venlafaxine XR (EFFEXOR-XR) 150 MG 24 hr capsule Take 150 mg by mouth daily.      Marland Kitchen zolpidem (AMBIEN) 5 MG tablet Take 5 mg by mouth at bedtime.       No current facility-administered medications for this visit.    Allergies  Allergen Reactions  . Prednisone Other (See Comments)    Reaction-abnormal behavior "makes me feel like I'm flying"    History   Social History  . Marital Status: Widowed    Spouse Name: N/A    Number of Children: N/A  . Years of Education: N/A   Occupational History  . Not on file.   Social History Main Topics  . Smoking status: Never Smoker   . Smokeless tobacco: Not on file  . Alcohol Use: No  . Drug Use: No  . Sexual Activity: No   Other Topics Concern  . Not on file   Social History Narrative  . No narrative on file     Review of Systems: General: negative for chills, fever, night sweats or weight changes.  Cardiovascular:  negative for chest pain, dyspnea on exertion, edema, orthopnea, palpitations, paroxysmal nocturnal dyspnea or shortness of breath Dermatological: negative for rash Respiratory: negative for cough or wheezing Urologic: negative for hematuria Abdominal: negative for nausea, vomiting, diarrhea, bright red blood per rectum, melena, or hematemesis Neurologic: negative for visual changes, syncope, or dizziness All other systems reviewed and are otherwise negative except as noted above.    Blood pressure 132/71, pulse 64, height 5\' 3"  (1.6 m), weight 184 lb (83.462 kg).  General appearance: alert and no distress Neck: no adenopathy, no carotid bruit, no JVD, supple, symmetrical, trachea midline and thyroid not enlarged, symmetric, no tenderness/mass/nodules Lungs: clear to auscultation bilaterally Heart:  irregularly irregular rhythm Extremities: extremities normal, atraumatic, no cyanosis or edema  EKG not performed today  ASSESSMENT AND PLAN:   Pulmonary embolism History of remote pulmonary embolism and DVT on Xarelto oral anticoagulation  Atrial fibrillation History of atrial fibrillation rate controlled on Xarelto oral anticoagulation. She does have a normal 2-D echo as well as Myoview stress test. She denies chest pain or shortness of breath. She had a near syncopal episode while in her doctor's office yesterday with hypotension and heart rates documented in the 40s. She was seen in the emergency room and told to hold her beta blocker and diuretic. Her heart rates are in the 70s today. I told her to go back on half dose metoprolol, 25 mg by mouth twice a day.      Lorretta Harp MD FACP,FACC,FAHA, [facility] 06/20/2014 11:55 AM

## 2014-07-03 ENCOUNTER — Encounter (HOSPITAL_COMMUNITY): Payer: Self-pay | Admitting: Emergency Medicine

## 2014-07-03 ENCOUNTER — Emergency Department (HOSPITAL_COMMUNITY)
Admission: EM | Admit: 2014-07-03 | Discharge: 2014-07-03 | Disposition: A | Discharge status: Home / Self Care | Payer: Medicare Other | Attending: Emergency Medicine | Admitting: Emergency Medicine

## 2014-07-03 ENCOUNTER — Emergency Department (HOSPITAL_COMMUNITY): Payer: Medicare Other

## 2014-07-03 DIAGNOSIS — R531 Weakness: Secondary | ICD-10-CM | POA: Insufficient documentation

## 2014-07-03 DIAGNOSIS — E785 Hyperlipidemia, unspecified: Secondary | ICD-10-CM | POA: Diagnosis not present

## 2014-07-03 DIAGNOSIS — I10 Essential (primary) hypertension: Secondary | ICD-10-CM | POA: Diagnosis not present

## 2014-07-03 DIAGNOSIS — Z79899 Other long term (current) drug therapy: Secondary | ICD-10-CM | POA: Insufficient documentation

## 2014-07-03 DIAGNOSIS — T43295A Adverse effect of other antidepressants, initial encounter: Secondary | ICD-10-CM | POA: Diagnosis not present

## 2014-07-03 DIAGNOSIS — Z7901 Long term (current) use of anticoagulants: Secondary | ICD-10-CM | POA: Diagnosis not present

## 2014-07-03 DIAGNOSIS — Z86711 Personal history of pulmonary embolism: Secondary | ICD-10-CM | POA: Insufficient documentation

## 2014-07-03 DIAGNOSIS — Y9289 Other specified places as the place of occurrence of the external cause: Secondary | ICD-10-CM | POA: Diagnosis not present

## 2014-07-03 DIAGNOSIS — Y9389 Activity, other specified: Secondary | ICD-10-CM | POA: Insufficient documentation

## 2014-07-03 DIAGNOSIS — R42 Dizziness and giddiness: Secondary | ICD-10-CM | POA: Diagnosis present

## 2014-07-03 DIAGNOSIS — I4891 Unspecified atrial fibrillation: Secondary | ICD-10-CM | POA: Diagnosis not present

## 2014-07-03 DIAGNOSIS — I159 Secondary hypertension, unspecified: Secondary | ICD-10-CM

## 2014-07-03 DIAGNOSIS — Z86718 Personal history of other venous thrombosis and embolism: Secondary | ICD-10-CM | POA: Diagnosis not present

## 2014-07-03 DIAGNOSIS — T50905A Adverse effect of unspecified drugs, medicaments and biological substances, initial encounter: Secondary | ICD-10-CM

## 2014-07-03 DIAGNOSIS — J441 Chronic obstructive pulmonary disease with (acute) exacerbation: Secondary | ICD-10-CM | POA: Insufficient documentation

## 2014-07-03 LAB — I-STAT TROPONIN, ED: Troponin i, poc: 0.01 ng/mL (ref 0.00–0.08)

## 2014-07-03 LAB — CBC WITH DIFFERENTIAL/PLATELET
BASOS ABS: 0.1 10*3/uL (ref 0.0–0.1)
Basophils Relative: 1 % (ref 0–1)
EOS PCT: 3 % (ref 0–5)
Eosinophils Absolute: 0.2 10*3/uL (ref 0.0–0.7)
HCT: 46.2 % — ABNORMAL HIGH (ref 36.0–46.0)
Hemoglobin: 15.1 g/dL — ABNORMAL HIGH (ref 12.0–15.0)
LYMPHS PCT: 20 % (ref 12–46)
Lymphs Abs: 1.6 10*3/uL (ref 0.7–4.0)
MCH: 29.4 pg (ref 26.0–34.0)
MCHC: 32.7 g/dL (ref 30.0–36.0)
MCV: 90.1 fL (ref 78.0–100.0)
MONO ABS: 0.7 10*3/uL (ref 0.1–1.0)
Monocytes Relative: 9 % (ref 3–12)
Neutro Abs: 5.4 10*3/uL (ref 1.7–7.7)
Neutrophils Relative %: 67 % (ref 43–77)
Platelets: 195 10*3/uL (ref 150–400)
RBC: 5.13 MIL/uL — ABNORMAL HIGH (ref 3.87–5.11)
RDW: 13.9 % (ref 11.5–15.5)
WBC: 8 10*3/uL (ref 4.0–10.5)

## 2014-07-03 LAB — BASIC METABOLIC PANEL
Anion gap: 12 (ref 5–15)
BUN: 22 mg/dL (ref 6–23)
CHLORIDE: 100 meq/L (ref 96–112)
CO2: 25 mEq/L (ref 19–32)
Calcium: 9 mg/dL (ref 8.4–10.5)
Creatinine, Ser: 1.06 mg/dL (ref 0.50–1.10)
GFR calc Af Amer: 54 mL/min — ABNORMAL LOW (ref >90)
GFR, EST NON AFRICAN AMERICAN: 47 mL/min — AB (ref >90)
Glucose, Bld: 103 mg/dL — ABNORMAL HIGH (ref 70–99)
POTASSIUM: 4.4 meq/L (ref 3.7–5.3)
Sodium: 137 mEq/L (ref 137–147)

## 2014-07-03 NOTE — ED Notes (Addendum)
Pt here from home via EMS for hypertension. Pt reporting an episode this afternoon at 1500 where she stood from a sitting position and "saw spots" with no CP, SOB, N/V, syncope. Pt denies any complaints at present. Pt has hx of intermittent a-fib, HTN, and and COPD. Per pt- 2 weeks ago, PCP reduced BP medication by half.

## 2014-07-03 NOTE — ED Provider Notes (Signed)
CSN: [id number]     Arrival date & time 07/03/14  1904 History   First MD Initiated Contact with Patient 07/03/14 1912     Chief Complaint  Patient presents with  . Hypertension     (Consider location/radiation/quality/duration/timing/severity/associated sxs/prior Treatment) HPI Comments: Patient with a prior history of hypertension, COPD, A. fib on xarelto presents today with a two-hour history from 2-4 a but generally not feeling well. She states that she was going to hang up some clothes and started to feel lightheaded in her bilateral legs felt like they were to give out which caused her to go for an sit down. The next several hours when she attempted to get up she continued to feel dizzy. During that timeframe she checked her blood pressure and it was elevated at 180/110. Patient denied any palpitations, chest pain, shortness of breath, fever, wheezing, cold sweats. Approximately 2 weeks ago patient was in the hospital for similar symptoms but at that time was extremely bradycardic with heart rate in the low 40s. At that time her metoprolol was changed to 25 mg twice a day instead of 50 mg twice a day and then another one of her blood pressure medications was changed. From that time on her weight has been normal. The only other change is she has been weaning off of her Wellbutrin. A week ago she went from 300-150 every other day and today was the first day she has not taken any at all.  The history is provided by the patient, a relative and the EMS personnel.    Past Medical History  Diagnosis Date  . Hypertension   . COPD (chronic obstructive pulmonary disease)   . Shortness of breath   . History of DVT (deep vein thrombosis)   . History of pulmonary embolism   . Chest pain   . Atrial fibrillation   . Hyperlipidemia    Past Surgical History  Procedure Laterality Date  . Replacement total knee     Family History  Problem Relation Age of Onset  . Diabetes type II Daughter     History  Substance Use Topics  . Smoking status: Never Smoker   . Smokeless tobacco: Not on file  . Alcohol Use: No   OB History   Grav Para Term Preterm Abortions TAB SAB Ect Mult Living                 Review of Systems  Constitutional: Negative for chills, diaphoresis and appetite change.  Respiratory: Negative for cough, chest tightness and shortness of breath.   Cardiovascular: Negative for chest pain and leg swelling.  Gastrointestinal: Negative for nausea, vomiting and abdominal pain.  Neurological: Positive for dizziness, weakness and light-headedness.  All other systems reviewed and are negative.     Allergies  Prednisone  Home Medications   Prior to Admission medications   Medication Sig Start Date End Date Taking? Authorizing Provider  buPROPion (WELLBUTRIN XL) 150 MG 24 hr tablet Take 150 mg by mouth every other day.     Historical Provider, MD  furosemide (LASIX) 40 MG tablet Take 40 mg by mouth daily.    Historical Provider, MD  metoprolol (LOPRESSOR) 25 MG tablet Take 1 tablet (25 mg total) by mouth 2 (two) times daily. 06/20/14   Lorretta Harp, MD  mirabegron ER (MYRBETRIQ) 50 MG TB24 Take 50 mg by mouth daily.    Historical Provider, MD  potassium chloride SA (K-DUR,KLOR-CON) 20 MEQ tablet Take 20 mEq by mouth  daily.    Historical Provider, MD  pravastatin (PRAVACHOL) 40 MG tablet Take 40 mg by mouth daily.     Historical Provider, MD  Rivaroxaban (XARELTO) 20 MG TABS tablet Take 20 mg by mouth daily with supper.    Historical Provider, MD  tiotropium (SPIRIVA) 18 MCG inhalation capsule Place 18 mcg into inhaler and inhale daily.    Historical Provider, MD  venlafaxine XR (EFFEXOR-XR) 150 MG 24 hr capsule Take 150 mg by mouth daily.    Historical Provider, MD  zolpidem (AMBIEN) 5 MG tablet Take 5 mg by mouth at bedtime. 11/03/13   Historical Provider, MD   BP 163/82  Pulse 90  Resp 23  SpO2 92% Physical Exam  Nursing note and vitals  reviewed. Constitutional: She is oriented to person, place, and time. She appears well-developed and well-nourished. No distress.  HENT:  Head: Normocephalic and atraumatic.  Eyes: EOM are normal. Pupils are equal, round, and reactive to light.  Cardiovascular: Normal rate, normal heart sounds and intact distal pulses.  An irregularly irregular rhythm present. Exam reveals no friction rub.   No murmur heard. Pulmonary/Chest: Effort normal. She has wheezes. She has no rales.  Scant wheezing  Abdominal: Soft. Bowel sounds are normal. She exhibits no distension. There is no tenderness. There is no rebound and no guarding.  Musculoskeletal: Normal range of motion. She exhibits no tenderness.  No edema  Neurological: She is alert and oriented to person, place, and time. No cranial nerve deficit.  Skin: Skin is warm and dry. No rash noted.  Psychiatric: She has a normal mood and affect. Her behavior is normal.    ED Course  Procedures (including critical care time) Labs Review Labs Reviewed  CBC WITH DIFFERENTIAL - Abnormal; Notable for the following:    RBC 5.13 (*)    Hemoglobin 15.1 (*)    HCT 46.2 (*)    All other components within normal limits  BASIC METABOLIC PANEL - Abnormal; Notable for the following:    Glucose, Bld 103 (*)    GFR calc non Af Amer 47 (*)    GFR calc Af Amer 54 (*)    All other components within normal limits  I-STAT TROPOININ, ED    Imaging Review Dg Chest 2 View  07/03/2014   CLINICAL DATA:  Near syncopal episode with hypertension  EXAM: CHEST  2 VIEW  COMPARISON:  06/19/2014  FINDINGS: There is no focal parenchymal opacity, pleural effusion, or pneumothorax. There is stable cardiomegaly.  There is a large hiatal hernia.  The osseous structures are unremarkable.  IMPRESSION: No active cardiopulmonary disease.   Electronically Signed   By: Kathreen Devoid   On: 07/03/2014 21:19     EKG Interpretation   Date/Time:  Monday July 03 2014 19:35:27  EDT Ventricular Rate:  69 PR Interval:    QRS Duration: 105 QT Interval:  407 QTC Calculation: 436 R Axis:   75 Text Interpretation:  Atrial fibrillation Nonspecific repol abnormality,  diffuse leads No significant change since last tracing Confirmed by  Maryan Rued  MD, Loree Fee ([id number]) on 07/03/2014 8:30:58 PM      MDM   Final diagnoses:  Secondary hypertension, unspecified  Adverse effects of medication, initial encounter    Patient presenting today with vague symptoms of not feeling well for several hours this afternoon which started when she was standing up attempting to hang up her cloths.  She complains of mild lightheadedness and generalized weakness feeling like her legs were going to  get out. She denied any focal weakness, speech difficulty or symptoms concerning for stroke. She denied any chest pain or shortness of breath. She also denied palpitations. Patient recently hospitalized for near syncope related to severe bradycardia which resolved when her metoprolol was changed. She's had no episodes of bradycardia here. No signs of hypotension however upon arrival patient was hypertensive and was noted to be hypertensive at home which resolved without intervention here to 137-150 over 80s. Patient denies any abdominal symptoms such as pain, nausea or, vomiting or diarrhea. Her abdominal exam is benign. She has no lower extremity edema or pain. Low suspicion for CHF, PE or DVT and she saw blood in her currently.  Most notably patient completely DC'd her Wellbutrin today after a very short taper. Feel most likely this is cause of her symptoms. Discussed with her monitoring of blood pressure and follow up with her doctor later this week if symptoms are not improving. She was given strict return precautions if she develops chest pain, shortness of breath, focal weakness, inability to walk or syncope. Patient and her daughter are comfortable with the plan and were DC'd    Blanchie Dessert,  MD 07/03/14 [phone]

## 2014-07-25 ENCOUNTER — Ambulatory Visit: Payer: Medicare Other

## 2014-08-25 ENCOUNTER — Telehealth: Payer: Self-pay | Admitting: Cardiovascular Disease

## 2014-08-25 NOTE — Telephone Encounter (Signed)
Close encounter 

## 2014-08-29 ENCOUNTER — Ambulatory Visit: Payer: Medicare Other

## 2014-09-19 ENCOUNTER — Ambulatory Visit (INDEPENDENT_AMBULATORY_CARE_PROVIDER_SITE_OTHER): Payer: Medicare Other | Admitting: Cardiology

## 2014-09-19 ENCOUNTER — Encounter: Payer: Self-pay | Admitting: Cardiology

## 2014-09-19 VITALS — BP 138/88 | HR 63 | Ht 63.0 in | Wt 190.9 lb

## 2014-09-19 DIAGNOSIS — R079 Chest pain, unspecified: Secondary | ICD-10-CM

## 2014-09-19 DIAGNOSIS — I482 Chronic atrial fibrillation, unspecified: Secondary | ICD-10-CM

## 2014-09-19 DIAGNOSIS — R600 Localized edema: Secondary | ICD-10-CM

## 2014-09-19 DIAGNOSIS — I2699 Other pulmonary embolism without acute cor pulmonale: Secondary | ICD-10-CM

## 2014-09-19 NOTE — Assessment & Plan Note (Signed)
She takes furosemide when necessary she will take it for 3 days in a row with one extra dose of potassium and then go back to as needed to rid her lower extremities of the edema.

## 2014-09-19 NOTE — Patient Instructions (Addendum)
Take your Lasix Daily for the next 3 days, and an additional dose of KLor -Con  Keep your appointment with Dr.Berry in March

## 2014-09-19 NOTE — Assessment & Plan Note (Signed)
On Xarelto 

## 2014-09-19 NOTE — Progress Notes (Signed)
09/19/2014   PCP: [name], MD   Chief Complaint  Patient presents with  . Follow-up    3 mo. chest tightness on occasion. has had cold for past couple weeks sees PCP this afternoon.     Primary Cardiologist:[name]   HPI:  80 year old moderately overweight married Caucasian female mother of 4 daughters, grandmother to 26 grandchildren was accompanied by one of her daughters , Raven Gray, today. She was referred by Dr. Wilson Singer for new-onset A. Fib and chest pain several months ago. The patient has a remote history of a pulmonary embolism and DVT on Xarelto oral autoregulation. Problems include hypertension, hyperlipidemia and COPD. She does have a family history of heart disease in the father who died of an MI. She does have a daughter who's had stents. She has never had a heart attack or stroke. Cardiac cath in 2008 with normal coronary arteries.  In the summer she said it was a chest pain which awakened her from sleep. She also feels weak and sluggish. An EKG performed by her primary care physician revealed atrial fibrillation which was newly recognized.  She had a 2-D echo cardiogram which was normal as was a Myoview stress test. She had done fairly well until Sept. when she had a presyncopal episode while in her primary care physician's office. Her blood pressure was low and her heart rate was in the 40s. She was seen at [facility] emergency room where telemetry did not show pauses and she was told to hold her beta blocker and diuretic. She was fluid resuscitated.  Today she is seen for routine follow-up. She had a good Christmas and only complained of occasional chest pains that last seconds at a time she also has a cough that has settled in her chest and has had productive cough.  She is to see Dr. Wilson Singer, this afternoon for this issue.  She has had episodes of rapid heart rate and is stated above unable to increase medications due to recent history of  bradycardia on higher dose of beta blocker. We briefly discussed possibility of pacemaker somewhere in the future. She will notify us if she has more frequent episodes of tachycardia.   Allergies  Allergen Reactions  . Prednisone Other (See Comments)    Reaction-abnormal behavior "makes me feel like I'm flying"    Current Outpatient Prescriptions  Medication Sig Dispense Refill  . furosemide (LASIX) 40 MG tablet Take 40 mg by mouth daily as needed for fluid or edema.     Marland Kitchen ibuprofen (ADVIL,MOTRIN) 200 MG tablet Take 600 mg by mouth every 6 (six) hours as needed for moderate pain.    . metoprolol (LOPRESSOR) 25 MG tablet Take 1 tablet (25 mg total) by mouth 2 (two) times daily. 180 tablet 3  . mirabegron ER (MYRBETRIQ) 50 MG TB24 Take 50 mg by mouth daily.    . potassium chloride SA (K-DUR,KLOR-CON) 20 MEQ tablet Take 20 mEq by mouth daily.    . pravastatin (PRAVACHOL) 40 MG tablet Take 40 mg by mouth every morning.     . Rivaroxaban (XARELTO) 20 MG TABS tablet Take 20 mg by mouth every morning.     . tiotropium (SPIRIVA) 18 MCG inhalation capsule Place 18 mcg into inhaler and inhale daily.    Marland Kitchen venlafaxine XR (EFFEXOR-XR) 150 MG 24 hr capsule Take 150 mg by mouth daily.    Marland Kitchen zolpidem (AMBIEN) 5 MG tablet Take 5 mg by  mouth at bedtime.     No current facility-administered medications for this visit.    Past Medical History  Diagnosis Date  . Hypertension   . COPD (chronic obstructive pulmonary disease)   . Shortness of breath   . History of DVT (deep vein thrombosis)   . History of pulmonary embolism   . Chest pain   . Atrial fibrillation   . Hyperlipidemia     Past Surgical History  Procedure Laterality Date  . Replacement total knee      UEK:[id number]:+ colds + recent fever, 6 lb weight increase Skin:no rashes or ulcers HEENT:no blurred vision, no congestion CV:see HPI PUL:see HPI GI:no diarrhea constipation or melena, no indigestion GU:no hematuria, no dysuria MS:no  joint pain, no claudication Neuro:no syncope, no lightheadedness Endo:no diabetes, no thyroid disease  Wt Readings from Last 3 Encounters:  09/19/14 190 lb 14.4 oz (86.592 kg)  06/20/14 184 lb (83.462 kg)  12/06/13 190 lb (86.183 kg)    PHYSICAL EXAM BP 138/88 mmHg  Pulse 63  Ht 5\' 3"  (1.6 m)  Wt 190 lb 14.4 oz (86.592 kg)  BMI 33.83 kg/m2 General:Pleasant affect, NAD Skin:Warm and dry, brisk capillary refill HEENT:normocephalic, sclera clear, mucus membranes moist Neck:supple, no JVD, no bruits  Heart:irreg irreg without murmur, gallup, rub or click Lungs:clear without rales, rhonchi, or wheezes PHX:TAVW, non tender, + BS, do not palpate liver spleen or masses Ext:  1-2 + lower ext edema,  2+ radial pulses Neuro:alert and oriented X 3, MAE, follows commands, + facial symmetry  PVX:YIAXKP fib rate controlled at 63 no acute changes, similar to previous tracing.   ASSESSMENT AND PLAN Atrial fibrillation Rate controlled that she may have episodes of rapid heart rate episodically.  She is on Xarelto without complications.  We did discuss she performs of anticoagulation Coumadin would be her only choice and she prefers to stay on Xarelto  Chest pain Continues with briefly fleeting episodes of chest discomfort she will notify us after her cold symptoms have resolved. He's had a recent negative stress test and a normal heart cath in 2008.  EKG without acute changes.  Edema of both legs She takes furosemide when necessary she will take it for 3 days in a row with one extra dose of potassium and then go back to as needed to rid her lower extremities of the edema.  Pulmonary embolism On Xarelto    Follow with Dr. Alvester Chou in 3 months

## 2014-09-19 NOTE — Assessment & Plan Note (Signed)
Rate controlled that she may have episodes of rapid heart rate episodically.  She is on Xarelto without complications.  We did discuss she performs of anticoagulation Coumadin would be her only choice and she prefers to stay on Xarelto

## 2014-09-19 NOTE — Assessment & Plan Note (Signed)
Continues with briefly fleeting episodes of chest discomfort she will notify us after her cold symptoms have resolved. He's had a recent negative stress test and a normal heart cath in 2008.  EKG without acute changes.

## 2014-09-27 ENCOUNTER — Encounter: Payer: Self-pay | Admitting: Cardiovascular Disease

## 2014-10-02 DIAGNOSIS — J029 Acute pharyngitis, unspecified: Secondary | ICD-10-CM | POA: Diagnosis not present

## 2014-10-02 DIAGNOSIS — F5101 Primary insomnia: Secondary | ICD-10-CM | POA: Diagnosis not present

## 2014-10-20 ENCOUNTER — Observation Stay (HOSPITAL_COMMUNITY)
Admission: EM | Admit: 2014-10-20 | Discharge: 2014-10-21 | Disposition: A | Discharge status: Home / Self Care | Payer: Medicare Other | Attending: Internal Medicine | Admitting: Internal Medicine

## 2014-10-20 ENCOUNTER — Encounter (HOSPITAL_COMMUNITY): Payer: Self-pay | Admitting: *Deleted

## 2014-10-20 ENCOUNTER — Emergency Department (HOSPITAL_COMMUNITY): Payer: Medicare Other

## 2014-10-20 DIAGNOSIS — F418 Other specified anxiety disorders: Secondary | ICD-10-CM | POA: Diagnosis not present

## 2014-10-20 DIAGNOSIS — I509 Heart failure, unspecified: Secondary | ICD-10-CM | POA: Diagnosis not present

## 2014-10-20 DIAGNOSIS — I482 Chronic atrial fibrillation, unspecified: Secondary | ICD-10-CM

## 2014-10-20 DIAGNOSIS — R079 Chest pain, unspecified: Secondary | ICD-10-CM | POA: Diagnosis not present

## 2014-10-20 DIAGNOSIS — F419 Anxiety disorder, unspecified: Secondary | ICD-10-CM | POA: Diagnosis not present

## 2014-10-20 DIAGNOSIS — K449 Diaphragmatic hernia without obstruction or gangrene: Secondary | ICD-10-CM | POA: Insufficient documentation

## 2014-10-20 DIAGNOSIS — R531 Weakness: Secondary | ICD-10-CM | POA: Insufficient documentation

## 2014-10-20 DIAGNOSIS — R600 Localized edema: Secondary | ICD-10-CM | POA: Insufficient documentation

## 2014-10-20 DIAGNOSIS — Z86718 Personal history of other venous thrombosis and embolism: Secondary | ICD-10-CM | POA: Insufficient documentation

## 2014-10-20 DIAGNOSIS — J449 Chronic obstructive pulmonary disease, unspecified: Secondary | ICD-10-CM | POA: Insufficient documentation

## 2014-10-20 DIAGNOSIS — Z86711 Personal history of pulmonary embolism: Secondary | ICD-10-CM | POA: Diagnosis not present

## 2014-10-20 DIAGNOSIS — I10 Essential (primary) hypertension: Secondary | ICD-10-CM | POA: Diagnosis not present

## 2014-10-20 DIAGNOSIS — Z7901 Long term (current) use of anticoagulants: Secondary | ICD-10-CM | POA: Insufficient documentation

## 2014-10-20 DIAGNOSIS — I4891 Unspecified atrial fibrillation: Secondary | ICD-10-CM | POA: Diagnosis not present

## 2014-10-20 DIAGNOSIS — E785 Hyperlipidemia, unspecified: Secondary | ICD-10-CM | POA: Diagnosis not present

## 2014-10-20 DIAGNOSIS — R072 Precordial pain: Secondary | ICD-10-CM | POA: Diagnosis not present

## 2014-10-20 LAB — URINE MICROSCOPIC-ADD ON

## 2014-10-20 LAB — CBC WITH DIFFERENTIAL/PLATELET
BASOS ABS: 0 10*3/uL (ref 0.0–0.1)
Basophils Relative: 0 % (ref 0–1)
EOS ABS: 0.1 10*3/uL (ref 0.0–0.7)
EOS PCT: 2 % (ref 0–5)
HCT: 46.5 % — ABNORMAL HIGH (ref 36.0–46.0)
Hemoglobin: 15 g/dL (ref 12.0–15.0)
LYMPHS ABS: 1.6 10*3/uL (ref 0.7–4.0)
LYMPHS PCT: 21 % (ref 12–46)
MCH: 28.5 pg (ref 26.0–34.0)
MCHC: 32.3 g/dL (ref 30.0–36.0)
MCV: 88.2 fL (ref 78.0–100.0)
MONO ABS: 0.7 10*3/uL (ref 0.1–1.0)
MONOS PCT: 9 % (ref 3–12)
Neutro Abs: 5.4 10*3/uL (ref 1.7–7.7)
Neutrophils Relative %: 68 % (ref 43–77)
Platelets: 204 10*3/uL (ref 150–400)
RBC: 5.27 MIL/uL — AB (ref 3.87–5.11)
RDW: 14 % (ref 11.5–15.5)
WBC: 7.9 10*3/uL (ref 4.0–10.5)

## 2014-10-20 LAB — BASIC METABOLIC PANEL
ANION GAP: 6 (ref 5–15)
BUN: 20 mg/dL (ref 6–23)
CALCIUM: 8.9 mg/dL (ref 8.4–10.5)
CHLORIDE: 105 mmol/L (ref 96–112)
CO2: 27 mmol/L (ref 19–32)
CREATININE: 0.95 mg/dL (ref 0.50–1.10)
GFR calc Af Amer: 62 mL/min — ABNORMAL LOW (ref >90)
GFR calc non Af Amer: 53 mL/min — ABNORMAL LOW (ref >90)
Glucose, Bld: 97 mg/dL (ref 70–99)
Potassium: 4.1 mmol/L (ref 3.5–5.1)
SODIUM: 138 mmol/L (ref 135–145)

## 2014-10-20 LAB — I-STAT TROPONIN, ED
TROPONIN I, POC: 0 ng/mL (ref 0.00–0.08)
Troponin i, poc: 0.01 ng/mL (ref 0.00–0.08)

## 2014-10-20 LAB — URINALYSIS, ROUTINE W REFLEX MICROSCOPIC
Glucose, UA: NEGATIVE mg/dL
Ketones, ur: 15 mg/dL — AB
NITRITE: NEGATIVE
PH: 6 (ref 5.0–8.0)
PROTEIN: 30 mg/dL — AB
Specific Gravity, Urine: 1.026 (ref 1.005–1.030)
Urobilinogen, UA: 1 mg/dL (ref 0.0–1.0)

## 2014-10-20 LAB — CBC
HCT: 44.2 % (ref 36.0–46.0)
Hemoglobin: 14.3 g/dL (ref 12.0–15.0)
MCH: 28.7 pg (ref 26.0–34.0)
MCHC: 32.4 g/dL (ref 30.0–36.0)
MCV: 88.6 fL (ref 78.0–100.0)
Platelets: 190 10*3/uL (ref 150–400)
RBC: 4.99 MIL/uL (ref 3.87–5.11)
RDW: 14 % (ref 11.5–15.5)
WBC: 6.5 10*3/uL (ref 4.0–10.5)

## 2014-10-20 LAB — BRAIN NATRIURETIC PEPTIDE: B Natriuretic Peptide: 197.7 pg/mL — ABNORMAL HIGH (ref 0.0–100.0)

## 2014-10-20 LAB — CREATININE, SERUM
CREATININE: 1.04 mg/dL (ref 0.50–1.10)
GFR calc Af Amer: 56 mL/min — ABNORMAL LOW (ref >90)
GFR, EST NON AFRICAN AMERICAN: 48 mL/min — AB (ref >90)

## 2014-10-20 LAB — TSH: TSH: 1.786 u[IU]/mL (ref 0.350–4.500)

## 2014-10-20 LAB — TROPONIN I: Troponin I: <0.03 ng/mL (ref <0.031)

## 2014-10-20 MED ORDER — ZOLPIDEM TARTRATE 5 MG PO TABS
5.0000 mg | ORAL_TABLET | Freq: Every day | ORAL | Status: DC
  Administered 2014-10-20: 5 mg via ORAL
  Filled 2014-10-20: qty 1

## 2014-10-20 MED ORDER — SODIUM CHLORIDE 0.9 % IJ SOLN
3.0000 mL | Freq: Two times a day (BID) | INTRAMUSCULAR | Status: DC
  Administered 2014-10-20: 3 mL via INTRAVENOUS

## 2014-10-20 MED ORDER — ASPIRIN EC 325 MG PO TBEC: 325.0000 mg | DELAYED_RELEASE_TABLET | Freq: Every day | ORAL | Status: DC

## 2014-10-20 MED ORDER — POTASSIUM CHLORIDE CRYS ER 20 MEQ PO TBCR
20.0000 meq | EXTENDED_RELEASE_TABLET | Freq: Every day | ORAL | Status: DC
  Administered 2014-10-20 – 2014-10-21 (×2): 20 meq via ORAL
  Filled 2014-10-20 (×2): qty 1

## 2014-10-20 MED ORDER — VENLAFAXINE HCL ER 150 MG PO CP24
150.0000 mg | ORAL_CAPSULE | Freq: Every day | ORAL | Status: DC
  Administered 2014-10-20 – 2014-10-21 (×2): 150 mg via ORAL
  Filled 2014-10-20 (×2): qty 1

## 2014-10-20 MED ORDER — SODIUM CHLORIDE 0.9 % IV SOLN
INTRAVENOUS | Status: DC
Start: 2014-10-20 — End: 2014-10-21
  Administered 2014-10-20: 50 mL/h via INTRAVENOUS

## 2014-10-20 MED ORDER — TIOTROPIUM BROMIDE MONOHYDRATE 18 MCG IN CAPS
18.0000 ug | ORAL_CAPSULE | Freq: Every day | RESPIRATORY_TRACT | Status: DC
  Filled 2014-10-20: qty 5

## 2014-10-20 MED ORDER — POTASSIUM CHLORIDE CRYS ER 20 MEQ PO TBCR: 20.0000 meq | EXTENDED_RELEASE_TABLET | Freq: Every day | ORAL | Status: DC

## 2014-10-20 MED ORDER — PRAVASTATIN SODIUM 40 MG PO TABS
40.0000 mg | ORAL_TABLET | Freq: Every morning | ORAL | Status: DC
  Administered 2014-10-21: 40 mg via ORAL
  Filled 2014-10-20: qty 1

## 2014-10-20 MED ORDER — FUROSEMIDE 40 MG PO TABS
40.0000 mg | ORAL_TABLET | Freq: Every day | ORAL | Status: DC | PRN
  Filled 2014-10-20: qty 1

## 2014-10-20 MED ORDER — HEPARIN SODIUM (PORCINE) 5000 UNIT/ML IJ SOLN: 5000.0000 [IU] | Freq: Three times a day (TID) | INTRAMUSCULAR | Status: DC

## 2014-10-20 MED ORDER — NITROGLYCERIN 0.4 MG SL SUBL: 0.4000 mg | SUBLINGUAL_TABLET | SUBLINGUAL | Status: DC | PRN

## 2014-10-20 MED ORDER — ASPIRIN EC 325 MG PO TBEC
325.0000 mg | DELAYED_RELEASE_TABLET | Freq: Every day | ORAL | Status: DC
  Administered 2014-10-20 – 2014-10-21 (×2): 325 mg via ORAL
  Filled 2014-10-20 (×2): qty 1

## 2014-10-20 MED ORDER — METOPROLOL TARTRATE 25 MG PO TABS
25.0000 mg | ORAL_TABLET | Freq: Two times a day (BID) | ORAL | Status: DC
  Administered 2014-10-20 – 2014-10-21 (×2): 25 mg via ORAL
  Filled 2014-10-20 (×3): qty 1

## 2014-10-20 MED ORDER — RIVAROXABAN 20 MG PO TABS
20.0000 mg | ORAL_TABLET | Freq: Every morning | ORAL | Status: DC
  Administered 2014-10-21: 20 mg via ORAL
  Filled 2014-10-20: qty 1

## 2014-10-20 MED ORDER — VENLAFAXINE HCL ER 150 MG PO CP24: 150.0000 mg | ORAL_CAPSULE | Freq: Every day | ORAL | Status: DC

## 2014-10-20 MED ORDER — MIRABEGRON ER 50 MG PO TB24
50.0000 mg | ORAL_TABLET | Freq: Every day | ORAL | Status: DC
  Administered 2014-10-21: 50 mg via ORAL
  Filled 2014-10-20 (×3): qty 1

## 2014-10-20 NOTE — ED Notes (Signed)
Pt c/o chest pressure and throbbing pain yesterday. Pt reports feeling weak when pressure started. Pain is intermittent. C/o diaphoresis and increased shortness of breath, relieved with oxygen. Pt with history of a-fib and PE

## 2014-10-20 NOTE — H&P (Addendum)
Hospitalist Admission History and Physical  Patient name: Raven Gray record number: [id number] Date of birth: 04/24/1928 Age: 81 y.o. Gender: female  Primary Care Provider: [name], MD  Chief Complaint: CP, weakness   History of Present Illness:This is a 81 y.o. year old female with significant past medical history of COPD, atrial fibrillation on xarelto, hx/o DVT, HTN presenting with CP. Weakness. Pt reports onset of weakness and CP starting yesterday. CP was central in nature w/o radiation. Mild in intensity. Mild nausea w/ sxs. Pt states that sxs persisted over the course of the night into today. States that sxs failed to improve over course of the day today. Has had mild generalized weakness associated w/ sxs. No fevers, chills, dysuria, diarrhea, abd pain. Pt states that she was in her otherwise normal state of health prior to yesterday.  Presented to [facility] ER T 98, HR 90s-100s, resp 10s, BP 130s-150s, satting 94% on RA. CBC and BMET WNL. Trop neg x1. EKG w/ rate controlled afib. Cardiology formally consulted. Recommending medical admission.   Heart Score: 6  Assessment and Plan: Raven Gray is a 81 y.o. year old female presenting with chest pain, weakness  Active Problems:   Chest pain   1- Chest Pain  -mixed sxs in pt w/ fair amount of cardiovascular risk factors -noted normal myoview 11/2013  -cardiology consulted- appreciate input  -2D ECHO per cards -cycle CEs  -risk stratification labs -full dose ASA -prn NTG   2- Weakness -suspect this may be likely secondary to above -no reported weakness prior to onset of CP  -non focal exam -no neurological deficits, though w/RFs  -MRI  -check TSH, vit D  -BNP -? UTI, though pt asymptomatic ( + blood, trace leuks on UA-micro clean)  -will culture   3- Afib  -rate controlled -cont home BB -cont xarelto   4- COPD -no resp distress, hypoxia, wheezing -cont home regimen -follow   FEN/GI: heart healthy  diet  Prophylaxis: xarelto  Disposition: pending further evaluation  Code Status:Full Code    Patient Active Problem List   Diagnosis Date Noted  . Edema of both legs 09/19/2014  . Atrial fibrillation 11/21/2013  . Chest pain 11/21/2013  . Community acquired pneumonia 06/19/2012  . Pulmonary embolism 06/19/2012  . Hypokalemia 06/18/2012  . COPD with acute exacerbation 06/18/2012  . CHF exacerbation 06/18/2012  . Hypoxia 06/18/2012   Past Medical History: Past Medical History  Diagnosis Date  . Hypertension   . COPD (chronic obstructive pulmonary disease)   . Shortness of breath   . History of DVT (deep vein thrombosis)   . History of pulmonary embolism   . Chest pain   . Atrial fibrillation   . Hyperlipidemia     Past Surgical History: Past Surgical History  Procedure Laterality Date  . Replacement total knee    . Rotator cuff repair      Social History: History   Social History  . Marital Status: Widowed    Spouse Name: N/A    Number of Children: N/A  . Years of Education: N/A   Social History Main Topics  . Smoking status: Never Smoker   . Smokeless tobacco: Never Used  . Alcohol Use: No  . Drug Use: No  . Sexual Activity: No   Other Topics Concern  . None   Social History Narrative    Family History: Family History  Problem Relation Age of Onset  . Diabetes type II Daughter   .  Heart disease Daughter     stents  . Heart attack Father     Allergies: Allergies  Allergen Reactions  . Prednisone Other (See Comments)    Reaction-abnormal behavior "makes me feel like I'm flying"    Current Facility-Administered Medications  Medication Dose Route Frequency Provider Last Rate Last Dose  . 0.9 %  sodium chloride infusion   Intravenous Continuous Shanda Howells, MD      . aspirin EC tablet 325 mg  325 mg Oral Daily Shanda Howells, MD      . furosemide (LASIX) tablet 40 mg  40 mg Oral Daily PRN Shanda Howells, MD      . metoprolol tartrate  (LOPRESSOR) tablet 25 mg  25 mg Oral BID Shanda Howells, MD      . mirabegron ER [facility]) tablet 50 mg  50 mg Oral Daily Shanda Howells, MD      . nitroGLYCERIN (NITROSTAT) SL tablet 0.4 mg  0.4 mg Sublingual Q5 min PRN Kristen N Ward, DO      . nitroGLYCERIN (NITROSTAT) SL tablet 0.4 mg  0.4 mg Sublingual Q5 min PRN Shanda Howells, MD      . potassium chloride SA (K-DUR,KLOR-CON) CR tablet 20 mEq  20 mEq Oral Daily Shanda Howells, MD      . Derrill Memo ON 10/21/2014] pravastatin (PRAVACHOL) tablet 40 mg  40 mg Oral q morning - 10a Shanda Howells, MD      . Derrill Memo ON 10/21/2014] rivaroxaban (XARELTO) tablet 20 mg  20 mg Oral q morning - 10a Shanda Howells, MD      . sodium chloride 0.9 % injection 3 mL  3 mL Intravenous Q12H Shanda Howells, MD      . tiotropium [facility]) inhalation capsule 18 mcg  18 mcg Inhalation Daily Shanda Howells, MD      . venlafaxine XR (EFFEXOR-XR) 24 hr capsule 150 mg  150 mg Oral Daily Shanda Howells, MD      . zolpidem (AMBIEN) tablet 5 mg  5 mg Oral QHS Shanda Howells, MD       Current Outpatient Prescriptions  Medication Sig Dispense Refill  . furosemide (LASIX) 40 MG tablet Take 40 mg by mouth daily as needed for fluid or edema.     Marland Kitchen ibuprofen (ADVIL,MOTRIN) 200 MG tablet Take 600 mg by mouth every 6 (six) hours as needed for moderate pain.    . metoprolol (LOPRESSOR) 25 MG tablet Take 1 tablet (25 mg total) by mouth 2 (two) times daily. 180 tablet 3  . mirabegron ER (MYRBETRIQ) 50 MG TB24 Take 50 mg by mouth daily.    . potassium chloride SA (K-DUR,KLOR-CON) 20 MEQ tablet Take 20 mEq by mouth daily.    . pravastatin (PRAVACHOL) 40 MG tablet Take 40 mg by mouth every morning.     . Rivaroxaban (XARELTO) 20 MG TABS tablet Take 20 mg by mouth every morning.     . tiotropium (SPIRIVA) 18 MCG inhalation capsule Place 18 mcg into inhaler and inhale daily.    Marland Kitchen venlafaxine XR (EFFEXOR-XR) 150 MG 24 hr capsule Take 150 mg by mouth daily.    Marland Kitchen zolpidem (AMBIEN) 5 MG tablet Take 5 mg  by mouth at bedtime.     Review Of Systems: 12 point ROS negative except as noted above in HPI.  Physical Exam: Filed Vitals:   10/20/14 1830  BP: 150/78  Pulse: 98  Temp:   Resp: 23    General: alert, cooperative and mildly obese HEENT: PERRLA and extra  ocular movement intact Heart: S1, S2 normal, no murmur, rub or gallop, regular rate and rhythm Lungs: clear to auscultation, no wheezes or rales and unlabored breathing Abdomen: abdomen is soft without significant tenderness, masses, organomegaly or guarding Extremities: 2+ peripheral pulses, trace- 1+ edema in LEs bilaterally  Skin:no rashes, no ecchymoses Neurology: normal without focal findings  Labs and Imaging: Lab Results  Component Value Date/Time   NA 138 10/20/2014 01:39 PM   K 4.1 10/20/2014 01:39 PM   CL 105 10/20/2014 01:39 PM   CO2 27 10/20/2014 01:39 PM   BUN 20 10/20/2014 01:39 PM   CREATININE 0.95 10/20/2014 01:39 PM   GLUCOSE 97 10/20/2014 01:39 PM   Lab Results  Component Value Date   WBC 7.9 10/20/2014   HGB 15.0 10/20/2014   HCT 46.5* 10/20/2014   MCV 88.2 10/20/2014   PLT 204 10/20/2014   Urinalysis    Component Value Date/Time   COLORURINE AMBER* 10/20/2014 1614   APPEARANCEUR HAZY* 10/20/2014 1614   LABSPEC 1.026 10/20/2014 1614   PHURINE 6.0 10/20/2014 1614   GLUCOSEU NEGATIVE 10/20/2014 1614   HGBUR LARGE* 10/20/2014 1614   BILIRUBINUR SMALL* 10/20/2014 1614   KETONESUR 15* 10/20/2014 1614   PROTEINUR 30* 10/20/2014 1614   UROBILINOGEN 1.0 10/20/2014 1614   NITRITE NEGATIVE 10/20/2014 1614   LEUKOCYTESUR TRACE* 10/20/2014 1614      Dg Chest 2 View  10/20/2014   CLINICAL DATA:  Acute chest pain.  EXAM: CHEST  2 VIEW  COMPARISON:  July 03, 2014.  FINDINGS: Stable cardiomediastinal silhouette. No pneumothorax or pleural effusion is noted. Large hiatal hernia is noted and stable. No acute pulmonary disease is noted. Degenerative and postsurgical changes are seen involving the right  shoulder.  IMPRESSION: Stable large hiatal hernia. No acute cardiopulmonary abnormality seen.   Electronically Signed   By: Sabino Dick M.D.   On: 10/20/2014 15:03           Shanda Howells MD  Pager: [phone]

## 2014-10-20 NOTE — ED Notes (Signed)
Pt to ED from home c/o chest pressure starting yesterday at home associated with shortness of breath. Afib HR 41-80 with hx; EMS gave 327ms ASA. VS BP 146/83. Pt on xarelto

## 2014-10-20 NOTE — Consult Note (Signed)
Referring Physician: ER Primary Cardiologist: Gwenlyn Found Reason for Consultation: CP and weaknes   HPI:  Ms. Chuck is is an 81 y/o woman with h/o obesity, chronic AF, hypertension, hyperlipidemia and COPD.  She also has a remote history of a pulmonary embolism and DVT on Xarelto oral autoregulation.   She had a cardiac cath in 2008 with normal coronary arteries. In 3/15 had CR and weakness. An EKG performed by her primary care physician revealed atrial fibrillation which was newly recognized. She had a 2-D echo which was normal as was a Myoview stress test. She had done fairly well until Sept. when she had a presyncopal episode while in her primary care physician's office. Her blood pressure was low and her heart rate was in the 40s. She was seen at [facility] emergency room where telemetry did not show pauses and she was told to hold her beta blocker and diuretic. She was fluid resuscitated.  Over the past few weeks has been weak. Yesterday was trying to get out of the bathroom to get to her hairdresser's appt. Felt SOB and weak. Daughter had to get her in a wheelchair to take her. Went to [employer]. Came back. Legs were very weak. Couldn't walk. Went to bed. Woke up  this morning feeling well. Daughter got there and again she was very weak. Hard to walk. Didn't feel well. Began crying. Then had chest discomfort. EMS came out and couldn't get BP reading. Told her heart was weak and brought her to ER.   Currently denies CP. ECG with chronic AF with no change. Labs normal.  No fevers. A little chill last night. No focal neuro deficits. Trop - x 2    Review of Systems:     Cardiac Review of Systems: {Y] = yes [ ]  = no  Chest Pain [ x   ]  Resting SOB [ x  ] Exertional SOB  [  ]  Orthopnea [  ]   Pedal Edema [ x  ]    Palpitations [  ] Syncope  [  ]   Presyncope [   ]  General Review of Systems: [Y] = yes [  ]=no Constitional: recent weight change [  ]; anorexia [  ]; fatigue [ x  ]; nausea [  ]; night sweats [  ]; fever [  ]; or chills [ x ];                                                                      Eyes : blurred vision [  ]; diplopia [   ]; vision changes [  ];  Amaurosis fugax[  ]; Resp: cough [  ];  wheezing[  ];  hemoptysis[  ];  PND [  ];  GI:  gallstones[  ], vomiting[  ];  dysphagia[  ]; melena[  ];  hematochezia [  ]; heartburn[  ];   GU: kidney stones [  ]; hematuria[  ];   dysuria [  ];  nocturia[  ]; incontinence [  ];             Skin: rash, swelling[  ];, hair loss[  ];  peripheral edema[  ];  or itching[  ];  Musculosketetal: myalgias[  ];  joint swelling[  ];  joint erythema[  ];  joint pain[ x ];  back pain[  ];  Heme/Lymph: bruising[  ];  bleeding[  ];  anemia[  ];  Neuro: TIA[  ];  headaches[  ];  stroke[  ];  vertigo[  ];  seizures[  ];   paresthesias[  ];  difficulty walking[x  ];  Psych:depression[  ]; anxiety[  ];  Endocrine: diabetes[  ];  thyroid dysfunction[  ];  Other:  Past Medical History  Diagnosis Date  . Hypertension   . COPD (chronic obstructive pulmonary disease)   . Shortness of breath   . History of DVT (deep vein thrombosis)   . History of pulmonary embolism   . Chest pain   . Atrial fibrillation   . Hyperlipidemia      (Not in a hospital admission)      Infusions:    Allergies  Allergen Reactions  . Prednisone Other (See Comments)    Reaction-abnormal behavior "makes me feel like I'm flying"    History   Social History  . Marital Status: Widowed    Spouse Name: N/A    Number of Children: N/A  . Years of Education: N/A   Occupational History  . Not on file.   Social History Main Topics  . Smoking status: Never Smoker   . Smokeless tobacco: Never Used  . Alcohol Use: No  . Drug Use: No  . Sexual Activity: No   Other Topics Concern  . Not on file   Social History Narrative    Family History  Problem Relation Age of Onset  . Diabetes type II Daughter   . Heart disease Daughter      stents  . Heart attack Father     PHYSICAL EXAM: Filed Vitals:   10/20/14 1545  BP: 136/81  Pulse: 107  Temp:   Resp: 28    No intake or output data in the 24 hours ending 10/20/14 1759  General:  Elderly lying flat in bed No respiratory difficulty HEENT: normal Neck: supple. no JVD. Carotids 2+ bilat; no bruits. No lymphadenopathy or thryomegaly appreciated. Cor: PMI nondisplaced. Irregular rate & rhythm. No rubs, gallops or murmurs. Lungs: clear Abdomen: obese soft, nontender, nondistended. No hepatosplenomegaly. No bruits or masses. Good bowel sounds. Extremities: no cyanosis, clubbing, rash, tr edema  Legs weak. Unable to liftt R shoulder due to previous rotator cuff surgery Neuro: alert & oriented x 3, cranial nerves grossly intact. moves all 4 extremities w/o difficulty. Affect pleasant.  ECG: AF 76 LVH No ST-T wave abnormalities.    Results for orders placed or performed during the hospital encounter of 10/20/14 (from the past 24 hour(s))  CBC with Differential/Platelet     Status: Abnormal   Collection Time: 10/20/14  1:39 PM  Result Value Ref Range   WBC 7.9 4.0 - 10.5 K/uL   RBC 5.27 (H) 3.87 - 5.11 MIL/uL   Hemoglobin 15.0 12.0 - 15.0 g/dL   HCT 46.5 (H) 36.0 - 46.0 %   MCV 88.2 78.0 - 100.0 fL   MCH 28.5 26.0 - 34.0 pg   MCHC 32.3 30.0 - 36.0 g/dL   RDW 14.0 11.5 - 15.5 %   Platelets 204 150 - 400 K/uL   Neutrophils Relative % 68 43 - 77 %   Neutro Abs 5.4 1.7 - 7.7 K/uL   Lymphocytes Relative 21 12 - 46 %   Lymphs Abs 1.6 0.7 - 4.0  K/uL   Monocytes Relative 9 3 - 12 %   Monocytes Absolute 0.7 0.1 - 1.0 K/uL   Eosinophils Relative 2 0 - 5 %   Eosinophils Absolute 0.1 0.0 - 0.7 K/uL   Basophils Relative 0 0 - 1 %   Basophils Absolute 0.0 0.0 - 0.1 K/uL  Basic metabolic panel     Status: Abnormal   Collection Time: 10/20/14  1:39 PM  Result Value Ref Range   Sodium 138 135 - 145 mmol/L   Potassium 4.1 3.5 - 5.1 mmol/L   Chloride 105 96 - 112 mmol/L    CO2 27 19 - 32 mmol/L   Glucose, Bld 97 70 - 99 mg/dL   BUN 20 6 - 23 mg/dL   Creatinine, Ser 0.95 0.50 - 1.10 mg/dL   Calcium 8.9 8.4 - 10.5 mg/dL   GFR calc non Af Amer 53 (L) >90 mL/min   GFR calc Af Amer 62 (L) >90 mL/min   Anion gap 6 5 - 15  I-stat troponin, ED     Status: None   Collection Time: 10/20/14  1:48 PM  Result Value Ref Range   Troponin i, poc 0.01 0.00 - 0.08 ng/mL   Comment 3          Urinalysis, Routine w reflex microscopic     Status: Abnormal   Collection Time: 10/20/14  4:14 PM  Result Value Ref Range   Color, Urine AMBER (A) YELLOW   APPearance HAZY (A) CLEAR   Specific Gravity, Urine 1.026 1.005 - 1.030   pH 6.0 5.0 - 8.0   Glucose, UA NEGATIVE NEGATIVE mg/dL   Hgb urine dipstick LARGE (A) NEGATIVE   Bilirubin Urine SMALL (A) NEGATIVE   Ketones, ur 15 (A) NEGATIVE mg/dL   Protein, ur 30 (A) NEGATIVE mg/dL   Urobilinogen, UA 1.0 0.0 - 1.0 mg/dL   Nitrite NEGATIVE NEGATIVE   Leukocytes, UA TRACE (A) NEGATIVE  Urine microscopic-add on     Status: Abnormal   Collection Time: 10/20/14  4:14 PM  Result Value Ref Range   Squamous Epithelial / LPF FEW (A) RARE   WBC, UA 0-2 <3 WBC/hpf   RBC / HPF 11-20 <3 RBC/hpf   Bacteria, UA RARE RARE   Casts HYALINE CASTS (A) NEGATIVE   Urine-Other MUCOUS PRESENT   I-stat troponin, ED     Status: None   Collection Time: 10/20/14  5:00 PM  Result Value Ref Range   Troponin i, poc 0.00 0.00 - 0.08 ng/mL   Comment 3           Dg Chest 2 View  10/20/2014   CLINICAL DATA:  Acute chest pain.  EXAM: CHEST  2 VIEW  COMPARISON:  July 03, 2014.  FINDINGS: Stable cardiomediastinal silhouette. No pneumothorax or pleural effusion is noted. Large hiatal hernia is noted and stable. No acute pulmonary disease is noted. Degenerative and postsurgical changes are seen involving the right shoulder.  IMPRESSION: Stable large hiatal hernia. No acute cardiopulmonary abnormality seen.   Electronically Signed   By: Sabino Dick M.D.   On:  10/20/2014 15:03     ASSESSMENT: 1. Weakness 2. Chest pain    --normal cors 2008 on cath    --Myoview 3/15 normal 3. Chronic AF  PLAN/DISCUSSION:  The main issue here seems to generalized weakness and inability to walk. She does report an episode of CP but it does not seem ischemic to me. Troponin and ECG are normal. Previous cath and recent  Myoview are normal. Will repeat echo but other than that do not see need for further cardiac w/u. I have asked ED to have Internal Medicine evaluate for FTT. We will follow as needed. Continue Xarelto for AF and previous DVT/PE.  Benay Spice 6:27 PM

## 2014-10-20 NOTE — ED Notes (Signed)
Attempted to call report. Floor RN unable to accept report.  

## 2014-10-20 NOTE — ED Provider Notes (Signed)
CSN: [id number]     Arrival date & time 10/20/14  1324 History   First MD Initiated Contact with Patient 10/20/14 1326     Chief Complaint  Patient presents with  . Chest Pain     (Consider location/radiation/quality/duration/timing/severity/associated sxs/prior Treatment) HPI Comments: Patients with history of atrial fibrillation, DVT/PE on Xarelto -- presents with complaint of chest pain and weakness. Symptoms began yesterday afternoon. Patient reports feeling generally weak without lightheadedness or syncope. Daughter reports that she is able to walk but barely lifts her feet off the floor. Patient denies signs of stroke including: facial droop, slurred speech, aphasia, weakness/numbness in extremities, imbalance/trouble walking. Patient began with chest pressure yesterday which was constant through the evening and the morning today. Daughter also reports diaphoresis and shortness of breath yesterday with episodes. Patient's daughter called EMS and patient noted relief when she was placed on oxygen. She was given aspirin by EMS but no nitroglycerin. Chest pressure has been associated with some shortness of breath.  Patient has had episodes of slow A. fib in the 40s and has been seen in emergency department for this in the past. Per clinic notes, she has been considered for pacemaker but has not yet received one.   Patient had normal catheterization 2008. Myoview negative 11/2013.   Patient is a 81 y.o. female presenting with chest pain. The history is provided by the patient and medical records.  Chest Pain Associated symptoms: diaphoresis and shortness of breath   Associated symptoms: no abdominal pain, no back pain, no cough, no fever, no nausea, no numbness, no palpitations, not vomiting and no weakness     Past Medical History  Diagnosis Date  . Hypertension   . COPD (chronic obstructive pulmonary disease)   . Shortness of breath   . History of DVT (deep vein thrombosis)   . History  of pulmonary embolism   . Chest pain   . Atrial fibrillation   . Hyperlipidemia    Past Surgical History  Procedure Laterality Date  . Replacement total knee    . Rotator cuff repair     Family History  Problem Relation Age of Onset  . Diabetes type II Daughter   . Heart disease Daughter     stents  . Heart attack Father    History  Substance Use Topics  . Smoking status: Never Smoker   . Smokeless tobacco: Never Used  . Alcohol Use: No   OB History    No data available     Review of Systems  Constitutional: Positive for diaphoresis. Negative for fever and activity change.  Eyes: Negative for redness.  Respiratory: Positive for shortness of breath. Negative for cough.   Cardiovascular: Positive for chest pain and leg swelling (baseline). Negative for palpitations.  Gastrointestinal: Negative for nausea, vomiting and abdominal pain.  Genitourinary: Negative for dysuria.  Musculoskeletal: Positive for myalgias (bilateral legs). Negative for back pain, joint swelling, arthralgias and neck pain.  Skin: Negative for rash and wound.  Neurological: Negative for syncope, weakness, light-headedness and numbness.    Allergies  Prednisone  Home Medications   Prior to Admission medications   Medication Sig Start Date End Date Taking? Authorizing Provider  furosemide (LASIX) 40 MG tablet Take 40 mg by mouth daily as needed for fluid or edema.     Historical Provider, MD  ibuprofen (ADVIL,MOTRIN) 200 MG tablet Take 600 mg by mouth every 6 (six) hours as needed for moderate pain.    Historical Provider, MD  metoprolol (  LOPRESSOR) 25 MG tablet Take 1 tablet (25 mg total) by mouth 2 (two) times daily. 06/20/14   Lorretta Harp, MD  mirabegron ER (MYRBETRIQ) 50 MG TB24 Take 50 mg by mouth daily.    Historical Provider, MD  potassium chloride SA (K-DUR,KLOR-CON) 20 MEQ tablet Take 20 mEq by mouth daily.    Historical Provider, MD  pravastatin (PRAVACHOL) 40 MG tablet Take 40 mg by  mouth every morning.     Historical Provider, MD  Rivaroxaban (XARELTO) 20 MG TABS tablet Take 20 mg by mouth every morning.     Historical Provider, MD  tiotropium (SPIRIVA) 18 MCG inhalation capsule Place 18 mcg into inhaler and inhale daily.    Historical Provider, MD  venlafaxine XR (EFFEXOR-XR) 150 MG 24 hr capsule Take 150 mg by mouth daily.    Historical Provider, MD  zolpidem (AMBIEN) 5 MG tablet Take 5 mg by mouth at bedtime. 11/03/13   Historical Provider, MD   BP 159/88 mmHg  Pulse 67  Temp(Src) 97.9 F (36.6 C) (Oral)  Resp 16  SpO2 100%   Physical Exam  Constitutional: She appears well-developed and well-nourished.  HENT:  Head: Normocephalic and atraumatic.  Mouth/Throat: Mucous membranes are normal. Mucous membranes are not dry.  Eyes: Conjunctivae are normal.  Neck: Trachea normal and normal range of motion. Neck supple. Normal carotid pulses and no JVD present. No muscular tenderness present. Carotid bruit is not present. No tracheal deviation present.  Cardiovascular: S1 normal, S2 normal, normal heart sounds and intact distal pulses.  An irregular rhythm present. Bradycardia present.  Exam reveals no decreased pulses.   No murmur heard. Pulmonary/Chest: Effort normal. No respiratory distress. She has no wheezes. She exhibits no tenderness.  Abdominal: Soft. Normal aorta and bowel sounds are normal. There is no tenderness. There is no rebound and no guarding.  Musculoskeletal: Normal range of motion. She exhibits tenderness (2+ symmetric pitting edema bilateral lower extremities).  Neurological: She is alert.  Skin: Skin is warm and dry. She is not diaphoretic. No cyanosis. No pallor.  Psychiatric: She has a normal mood and affect.  Nursing note and vitals reviewed.   ED Course  Procedures (including critical care time) Labs Review Labs Reviewed  CBC WITH DIFFERENTIAL/PLATELET - Abnormal; Notable for the following:    RBC 5.27 (*)    HCT 46.5 (*)    All other  components within normal limits  BASIC METABOLIC PANEL - Abnormal; Notable for the following:    GFR calc non Af Amer 53 (*)    GFR calc Af Amer 62 (*)    All other components within normal limits  URINALYSIS, ROUTINE W REFLEX MICROSCOPIC - Abnormal; Notable for the following:    Color, Urine AMBER (*)    APPearance HAZY (*)    Hgb urine dipstick LARGE (*)    Bilirubin Urine SMALL (*)    Ketones, ur 15 (*)    Protein, ur 30 (*)    Leukocytes, UA TRACE (*)    All other components within normal limits  URINE MICROSCOPIC-ADD ON - Abnormal; Notable for the following:    Squamous Epithelial / LPF FEW (*)    Casts HYALINE CASTS (*)    All other components within normal limits  I-STAT TROPOININ, ED  Randolm Idol, ED    Imaging Review Dg Chest 2 View  10/20/2014   CLINICAL DATA:  Acute chest pain.  EXAM: CHEST  2 VIEW  COMPARISON:  July 03, 2014.  FINDINGS: Stable cardiomediastinal  silhouette. No pneumothorax or pleural effusion is noted. Large hiatal hernia is noted and stable. No acute pulmonary disease is noted. Degenerative and postsurgical changes are seen involving the right shoulder.  IMPRESSION: Stable large hiatal hernia. No acute cardiopulmonary abnormality seen.   Electronically Signed   By: Sabino Dick M.D.   On: 10/20/2014 15:03     EKG Interpretation   Date/Time:  Friday October 20 2014 13:50:49 EST Ventricular Rate:  76 PR Interval:    QRS Duration: 104 QT Interval:  417 QTC Calculation: 469 R Axis:   77 Text Interpretation:  Atrial fibrillation Repol abnrm suggests ischemia,  anterolateral No significant change since last tracing Confirmed by WARD,   DO, KRISTEN ([id number]) on 10/20/2014 4:24:05 PM       2:01 PM Patient seen and examined. Work-up initiated. Medications ordered.    Vital signs reviewed and are as follows: BP 159/88 mmHg  Pulse 67  Temp(Src) 97.9 F (36.6 C) (Oral)  Resp 16  SpO2 100%   Date: 10/20/2014  Rate: 76  Rhythm: atrial  fibrillation  QRS Axis: normal  Intervals: normal  ST/T Wave abnormalities: ST depressions anteriorly/inferiorly  Conduction Disutrbances:none  Narrative Interpretation:   Old EKG Reviewed: unchanged  5:19 PM Patient discussed and seen by Dr. Leonides Schanz. Per Dr. Leonides Schanz, patient continues to have some chest tightness. Will call cardiology to see patient and provide recommendations.   BP 136/81 mmHg  Pulse 107  Temp(Src) 97.9 F (36.6 C) (Oral)  Resp 28  SpO2 97%   MDM   Final diagnoses:  Chest pain, unspecified chest pain type  Chronic atrial fibrillation   Pending cardiology eval. Patient with CP and generalized weakness.     Carlisle Cater, PA-C 10/20/14 [place], DO 10/20/14 1725

## 2014-10-20 NOTE — ED Notes (Signed)
Transporting patient to new room assignment. 

## 2014-10-20 NOTE — ED Notes (Addendum)
Bedside report accepted with [place] RN

## 2014-10-21 ENCOUNTER — Observation Stay (HOSPITAL_COMMUNITY): Payer: Medicare Other

## 2014-10-21 DIAGNOSIS — R079 Chest pain, unspecified: Secondary | ICD-10-CM | POA: Diagnosis not present

## 2014-10-21 DIAGNOSIS — I482 Chronic atrial fibrillation: Secondary | ICD-10-CM | POA: Diagnosis not present

## 2014-10-21 DIAGNOSIS — R072 Precordial pain: Secondary | ICD-10-CM | POA: Diagnosis not present

## 2014-10-21 DIAGNOSIS — R531 Weakness: Secondary | ICD-10-CM | POA: Diagnosis not present

## 2014-10-21 LAB — CBC WITH DIFFERENTIAL/PLATELET
BASOS PCT: 1 % (ref 0–1)
Basophils Absolute: 0 10*3/uL (ref 0.0–0.1)
Eosinophils Absolute: 0.2 10*3/uL (ref 0.0–0.7)
Eosinophils Relative: 3 % (ref 0–5)
HCT: 43.5 % (ref 36.0–46.0)
HEMOGLOBIN: 13.9 g/dL (ref 12.0–15.0)
LYMPHS ABS: 1.7 10*3/uL (ref 0.7–4.0)
LYMPHS PCT: 23 % (ref 12–46)
MCH: 28.3 pg (ref 26.0–34.0)
MCHC: 32 g/dL (ref 30.0–36.0)
MCV: 88.4 fL (ref 78.0–100.0)
Monocytes Absolute: 0.8 10*3/uL (ref 0.1–1.0)
Monocytes Relative: 11 % (ref 3–12)
Neutro Abs: 4.5 10*3/uL (ref 1.7–7.7)
Neutrophils Relative %: 62 % (ref 43–77)
Platelets: 194 10*3/uL (ref 150–400)
RBC: 4.92 MIL/uL (ref 3.87–5.11)
RDW: 13.9 % (ref 11.5–15.5)
WBC: 7.1 10*3/uL (ref 4.0–10.5)

## 2014-10-21 LAB — COMPREHENSIVE METABOLIC PANEL
ALT: 11 U/L (ref 0–35)
AST: 17 U/L (ref 0–37)
Albumin: 3.2 g/dL — ABNORMAL LOW (ref 3.5–5.2)
Alkaline Phosphatase: 68 U/L (ref 39–117)
Anion gap: 8 (ref 5–15)
BUN: 21 mg/dL (ref 6–23)
CALCIUM: 8.5 mg/dL (ref 8.4–10.5)
CHLORIDE: 106 mmol/L (ref 96–112)
CO2: 26 mmol/L (ref 19–32)
CREATININE: 0.89 mg/dL (ref 0.50–1.10)
GFR, EST AFRICAN AMERICAN: 67 mL/min — AB (ref >90)
GFR, EST NON AFRICAN AMERICAN: 58 mL/min — AB (ref >90)
Glucose, Bld: 107 mg/dL — ABNORMAL HIGH (ref 70–99)
Potassium: 3.5 mmol/L (ref 3.5–5.1)
SODIUM: 140 mmol/L (ref 135–145)
Total Bilirubin: 0.7 mg/dL (ref 0.3–1.2)
Total Protein: 6.1 g/dL (ref 6.0–8.3)

## 2014-10-21 LAB — TROPONIN I
Troponin I: <0.03 ng/mL (ref <0.031)
Troponin I: <0.03 ng/mL (ref <0.031)

## 2014-10-21 MED ORDER — ALPRAZOLAM 0.25 MG PO TABS: 0.2500 mg | ORAL_TABLET | Freq: Two times a day (BID) | ORAL | Status: DC | PRN

## 2014-10-21 MED ORDER — ALPRAZOLAM 0.25 MG PO TABS
0.2500 mg | ORAL_TABLET | Freq: Two times a day (BID) | ORAL | Status: DC | PRN
  Filled 2014-10-21: qty 1

## 2014-10-21 NOTE — Progress Notes (Signed)
  Echocardiogram 2D Echocardiogram has been performed.  Raven Gray FRANCES 10/21/2014, 12:20 PM

## 2014-10-21 NOTE — Discharge Summary (Signed)
Physician Discharge Summary  Raven Gray BJS:[id number] DOB: [birth year] DOA: 10/20/2014  PCP: [name], MD  Admit date: 10/20/2014 Discharge date: 10/21/2014  Time spent: 35 minutes  Recommendations for Outpatient Follow-up:  Patient will be discharged to home with home health, PT. She is to follow-up with her primary care physician within one week of discharge. Patient should also follow-up with her cardiologist within 1-2 weeks of discharge. Patient to continue her medications as prescribed. Patient to follow a heart healthy diet. Patient may resume activity as tolerated.  Discharge Diagnoses:  Chest pain Generalized weakness COPD Atrial fibrillation History of DVT Hypertension  Discharge Condition: Stable  Diet recommendation: Heart healthy  Filed Weights   10/20/14 2134 10/21/14 0534  Weight: 85.866 kg (189 lb 4.8 oz) 85.1 kg (187 lb 9.8 oz)    History of present illness:  By Dr. Shanda Howells on 10/20/2014 This is a 81 y.o. year old female with significant past medical history of COPD, atrial fibrillation on xarelto, hx/o DVT, HTN presenting with CP. Weakness. Pt reports onset of weakness and CP starting yesterday. CP was central in nature w/o radiation. Mild in intensity. Mild nausea w/ sxs. Pt states that sxs persisted over the course of the night into today. States that sxs failed to improve over course of the day today. Has had mild generalized weakness associated w/ sxs. No fevers, chills, dysuria, diarrhea, abd pain. Pt states that she was in her otherwise normal state of health prior to yesterday.  Presented to [facility] ER T 98, HR 90s-100s, resp 10s, BP 130s-150s, satting 94% on RA. CBC and BMET WNL. Trop neg x1. EKG w/ rate controlled afib. Cardiology formally consulted. Recommending medical admission.   Hospital Course:  Chest pain -Resolved -Troponin cycled and found to be negative -Cardiology consulted and appreciated, recommend a repeat echocardiogram, no  further workup -Patient had cardiac catheterization 2008 which was normal, normal Myoview March 2015 -Continue metoprolol, statin (patient on xarelto) -Echocardiogram EF 55-60%  Generalized weakness -Patient has no neurological deficits -PT consulted and recommended home health -TSH 1.786 -Vitamin D pending and can be followed by her PCP -MRI brain: no acute finding  Atrial fibrillation -CHADSvasc 4 -Currently rate controlled, continue metoprolol -Continue Xarelto  History of DVT -Continue Xarelto  COPD -Continue home regimen, spiriva  -patient has no wheezing at this time  Depression/Anxiety -Contiue Effexor -Will give patient trial of xanax PRN.  She should also discuss this with her PCP.  Discussed with patient and daughter.  Asymptomatic bacteriuria -UA: trace leukocytes, 0-2WBC, few sq epithelial cells  Procedures: Echocardiogram  Consultations: Cardiology  Discharge Exam: Filed Vitals:   10/21/14 1339  BP: 133/58  Pulse: 62  Temp: 97.5 F (36.4 C)  Resp: 19     General: Well developed, well nourished, NAD, appears stated age  HEENT: NCAT, mucous membranes moist.  Cardiovascular: S1 S2 auscultated, irregular.  Respiratory: Clear to auscultation bilaterally with equal chest rise  Abdomen: Soft, nontender, nondistended, + bowel sounds  Extremities: warm dry without cyanosis clubbing or edema  Neuro: AAOx3, nonfocal  Psych: Normal affect and demeanor with intact judgement and insight  Discharge Instructions      Discharge Instructions    Discharge instructions    Complete by:  As directed   Patient will be discharged to home with home health PT. She is to follow-up with her primary care physician within one week of discharge. Patient should also follow-up with her cardiologist within 1-2 weeks of discharge. Patient to continue  her medications as prescribed. Patient to follow a heart healthy diet. Patient may resume activity as tolerated.              Medication List    TAKE these medications        ALPRAZolam 0.25 MG tablet  Commonly known as:  XANAX  Take 1 tablet (0.25 mg total) by mouth 2 (two) times daily as needed for anxiety.     furosemide 40 MG tablet  Commonly known as:  LASIX  Take 40 mg by mouth daily as needed for fluid or edema.     ibuprofen 200 MG tablet  Commonly known as:  ADVIL,MOTRIN  Take 600 mg by mouth every 6 (six) hours as needed for moderate pain.     metoprolol tartrate 25 MG tablet  Commonly known as:  LOPRESSOR  Take 1 tablet (25 mg total) by mouth 2 (two) times daily.     mirabegron ER 50 MG Tb24 tablet  Commonly known as:  MYRBETRIQ  Take 50 mg by mouth daily.     potassium chloride SA 20 MEQ tablet  Commonly known as:  K-DUR,KLOR-CON  Take 20 mEq by mouth daily.     pravastatin 40 MG tablet  Commonly known as:  PRAVACHOL  Take 40 mg by mouth every morning.     tiotropium 18 MCG inhalation capsule  Commonly known as:  SPIRIVA  Place 18 mcg into inhaler and inhale daily.     venlafaxine XR 150 MG 24 hr capsule  Commonly known as:  EFFEXOR-XR  Take 150 mg by mouth daily.     XARELTO 20 MG Tabs tablet  Generic drug:  rivaroxaban  Take 20 mg by mouth every morning.     zolpidem 5 MG tablet  Commonly known as:  AMBIEN  Take 5 mg by mouth at bedtime.       Allergies  Allergen Reactions  . Prednisone Other (See Comments)    Reaction-abnormal behavior "makes me feel like I'm flying"   Follow-up Information    Follow up with [name], MD. Schedule an appointment as soon as possible for a visit in 1 week.   Specialty:  Endocrinology   Why:  Hospital followup   Contact information:   [address] [place] [address] [phone]        The results of significant diagnostics from this hospitalization (including imaging, microbiology, ancillary and laboratory) are listed below for reference.    Significant Diagnostic Studies: Dg Chest 2  View  10/20/2014   CLINICAL DATA:  Acute chest pain.  EXAM: CHEST  2 VIEW  COMPARISON:  July 03, 2014.  FINDINGS: Stable cardiomediastinal silhouette. No pneumothorax or pleural effusion is noted. Large hiatal hernia is noted and stable. No acute pulmonary disease is noted. Degenerative and postsurgical changes are seen involving the right shoulder.  IMPRESSION: Stable large hiatal hernia. No acute cardiopulmonary abnormality seen.   Electronically Signed   By: Sabino Dick M.D.   On: 10/20/2014 15:03   Mr Brain Wo Contrast  10/21/2014   CLINICAL DATA:  Patient with atrial fibrillation who presented with acute weakness and chest pain. Weakness is generalized.  EXAM: MRI HEAD WITHOUT CONTRAST  TECHNIQUE: Multiplanar, multiecho pulse sequences of the brain and surrounding structures were obtained without intravenous contrast.  COMPARISON:  None.  FINDINGS: Diffusion imaging does not show any acute or subacute infarction. There chronic small-vessel ischemic changes of the pons. There are a few old small vessel cerebellar infarctions.  Lacunar infarctions are present in the thalami, right more than left. There is old cortical and subcortical infarction in the left occipital lobe. Elsewhere, the cerebral hemispheres show generalized atrophy with chronic small vessel disease throughout the deep white matter. The ventricles are prominent, consistent with central atrophy. No mass lesion, hemorrhage or extra-axial collection.  IMPRESSION: No acute finding.  Extensive chronic small vessel ischemic changes throughout the brain as outlined above. 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  The ventricles are prominent, felt most likely secondary to central atrophy. The possibility of normal pressure hydrocephalus does exist, but I think is considerably less likely.   Electronically Signed   By: Nelson Chimes M.D.   On: 10/21/2014 14:49    Microbiology: No results found for this or any previous visit (from  the past 240 hour(s)).   Labs: Basic Metabolic Panel:  Recent Labs Lab 10/20/14 1339 10/20/14 2013 10/21/14 0118  NA 138  --  140  K 4.1  --  3.5  CL 105  --  106  CO2 27  --  26  GLUCOSE 97  --  107*  BUN 20  --  21  CREATININE 0.95 1.04 0.89  CALCIUM 8.9  --  8.5   Liver Function Tests:  Recent Labs Lab 10/21/14 0118  AST 17  ALT 11  ALKPHOS 68  BILITOT 0.7  PROT 6.1  ALBUMIN 3.2*   No results for input(s): LIPASE, AMYLASE in the last 168 hours. No results for input(s): AMMONIA in the last 168 hours. CBC:  Recent Labs Lab 10/20/14 1339 10/20/14 2013 10/21/14 0118  WBC 7.9 6.5 7.1  NEUTROABS 5.4  --  4.5  HGB 15.0 14.3 13.9  HCT 46.5* 44.2 43.5  MCV 88.2 88.6 88.4  PLT 204 190 194   Cardiac Enzymes:  Recent Labs Lab 10/20/14 2013 10/21/14 0118 10/21/14 0845  TROPONINI <0.03 <0.03 <0.03   BNP: BNP (last 3 results) No results for input(s): PROBNP in the last 8760 hours. CBG: No results for input(s): GLUCAP in the last 168 hours.     SignedCOURTNY, Raven Gray  [facility] 10/21/2014, 2:59 PM

## 2014-10-21 NOTE — Progress Notes (Addendum)
Patient declined to have MRI of brain this morning stating that she would like to speak with her daughter and the doctors about it first to determine if it's necessary.

## 2014-10-21 NOTE — Progress Notes (Signed)
UR completed 

## 2014-10-21 NOTE — Progress Notes (Signed)
Discharged to home with family office visits in place teaching done  

## 2014-10-21 NOTE — Discharge Instructions (Signed)

## 2014-10-21 NOTE — Evaluation (Signed)
Physical Therapy Evaluation Patient Details Name: Raven Gray MRN: [id number] DOB: January 06, 1928 Today's Date: 10/21/2014   History of Present Illness  This is a 81 y.o. year old female with significant past medical history of COPD, atrial fibrillation on xarelto, hx/o DVT, HTN presenting with CP Weakness  Clinical Impression  Pt moving well and eager to return home. Pt reports she has life alert at home and keeps with her all the time. Pt reports no falls in the last year but that she does furniture walk. Pt encouraged to use RW at all times and agreeable to this as well as HHPT. Pt will benefit from acute therapy to maximize mobility, balance and gait to decrease fall risk.      Follow Up Recommendations Home health PT    Equipment Recommendations  None recommended by PT    Recommendations for Other Services       Precautions / Restrictions Precautions Precautions: Fall      Mobility  Bed Mobility Overal bed mobility: Modified Independent                Transfers Overall transfer level: Needs assistance   Transfers: Sit to/from Stand Sit to Stand: Supervision         General transfer comment: cues for hand placement, safety, controlled descent  Ambulation/Gait Ambulation/Gait assistance: Supervision Ambulation Distance (Feet): 300 Feet Assistive device: Rolling walker (2 wheeled) Gait Pattern/deviations: Step-through pattern;Trunk flexed   Gait velocity interpretation: at or above normal speed for age/gender General Gait Details: constant cues for posture and position of hips in the RW  Stairs Stairs: Yes Stairs assistance: Modified independent (Device/Increase time) Stair Management: One rail Left;Forwards;Step to pattern Number of Stairs: 4    Wheelchair Mobility    Modified Rankin (Stroke Patients Only)       Balance Overall balance assessment: Needs assistance   Sitting balance-Leahy Scale: Good       Standing balance-Leahy Scale: Fair                                Pertinent Vitals/Pain Pain Assessment: No/denies pain  HR 80 sats 97% on RA    Home Living Family/patient expects to be discharged to:: Private residence Living Arrangements: Alone Available Help at Discharge: Family;Available PRN/intermittently Type of Home: House Home Access: Stairs to enter Entrance Stairs-Rails: Left Entrance Stairs-Number of Steps: 3 Home Layout: One level Home Equipment: Walker - 2 wheels;Cane - single point;Bedside commode      Prior Function Level of Independence: Independent         Comments: pt states she uses RW and cane at times but tends to furniture walk, doesn't drive     Hand Dominance        Extremity/Trunk Assessment   Upper Extremity Assessment: Generalized weakness           Lower Extremity Assessment: Generalized weakness      Cervical / Trunk Assessment: Kyphotic  Communication   Communication: No difficulties  Cognition Arousal/Alertness: Awake/alert Behavior During Therapy: WFL for tasks assessed/performed Overall Cognitive Status: Within Functional Limits for tasks assessed                      General Comments      Exercises        Assessment/Plan    PT Assessment Patient needs continued PT services  PT Diagnosis Generalized weakness;Difficulty walking   PT Problem List Decreased  strength;Decreased activity tolerance;Decreased knowledge of use of DME  PT Treatment Interventions Gait training;DME instruction;Functional mobility training;Therapeutic exercise;Balance training   PT Goals (Current goals can be found in the Care Plan section) Acute Rehab PT Goals Patient Stated Goal: return home PT Goal Formulation: With patient Time For Goal Achievement: 10/28/14 Potential to Achieve Goals: Good    Frequency Min 3X/week   Barriers to discharge Decreased caregiver support      Co-evaluation               End of Session   Activity Tolerance:  Patient tolerated treatment well Patient left: in chair;with call bell/phone within reach Nurse Communication: Mobility status;Precautions    Functional Assessment Tool Used: clinical judgement  Functional Limitation: Mobility: Walking and moving around Mobility: Walking and Moving Around Current Status (P8242): At least 1 percent but less than 20 percent impaired, limited or restricted Mobility: Walking and Moving Around Goal Status [phone]): At least 1 percent but less than 20 percent impaired, limited or restricted    Time: 0919-0934 PT Time Calculation (min) (ACUTE ONLY): 15 min   Charges:   PT Evaluation $Initial PT Evaluation Tier I: 1 Procedure PT Treatments $Gait Training: 8-22 mins   PT G Codes:   PT G-Codes **NOT FOR INPATIENT CLASS** Functional Assessment Tool Used: clinical judgement  Functional Limitation: Mobility: Walking and moving around Mobility: Walking and Moving Around Current Status (W4315): At least 1 percent but less than 20 percent impaired, limited or restricted Mobility: Walking and Moving Around Goal Status [phone]): At least 1 percent but less than 20 percent impaired, limited or restricted    Melford Aase 10/21/2014, 10:22 AM Elwyn Reach, [place]

## 2014-10-22 LAB — URINE CULTURE: Colony Count: >=100000

## 2014-10-23 LAB — HEMOGLOBIN A1C
Hgb A1c MFr Bld: 6.4 % — ABNORMAL HIGH (ref 4.8–5.6)
Mean Plasma Glucose: 137 mg/dL

## 2014-10-24 DIAGNOSIS — Z7901 Long term (current) use of anticoagulants: Secondary | ICD-10-CM | POA: Diagnosis not present

## 2014-10-24 DIAGNOSIS — I10 Essential (primary) hypertension: Secondary | ICD-10-CM | POA: Diagnosis not present

## 2014-10-24 DIAGNOSIS — I4891 Unspecified atrial fibrillation: Secondary | ICD-10-CM | POA: Diagnosis not present

## 2014-10-24 DIAGNOSIS — J449 Chronic obstructive pulmonary disease, unspecified: Secondary | ICD-10-CM | POA: Diagnosis not present

## 2014-10-24 DIAGNOSIS — Z86718 Personal history of other venous thrombosis and embolism: Secondary | ICD-10-CM | POA: Diagnosis not present

## 2014-10-24 DIAGNOSIS — M6281 Muscle weakness (generalized): Secondary | ICD-10-CM | POA: Diagnosis not present

## 2014-10-24 LAB — VITAMIN D 25 HYDROXY (VIT D DEFICIENCY, FRACTURES): VIT D 25 HYDROXY: 7.5 ng/mL — AB (ref 30.0–100.0)

## 2014-10-24 NOTE — Care Management Note (Signed)
    Page 1 of 1   10/24/2014     12:28:37 PM CARE MANAGEMENT NOTE 10/24/2014  Patient:  Raven Gray, Raven Gray   Account Number:  [id number]  Date Initiated:  10/24/2014  Documentation initiated by:  Marvetta Gibbons  Subjective/Objective Assessment:   Pt admitted with c/p weakness     Action/Plan:   PTA pt lived at home - has daughter to assist   Anticipated DC Date:  10/21/2014   Anticipated DC Plan:  [place]  CM consult      [facility] Choice  HOME HEALTH   Choice offered to / List presented to:  C-4 Adult Children        HH arranged  HH-2 PT      [place].   Status of service:  Completed, signed off Medicare Important Message given?  NO (If response is "NO", the following Medicare IM given date fields will be blank) Date Medicare IM given:   Medicare IM given by:   Date Additional Medicare IM given:   Additional Medicare IM given by:    Discharge Disposition:  [place]  Per UR Regulation:  Reviewed for med. necessity/level of care/duration of stay  If discussed at [place] of Stay Meetings, dates discussed:    Comments:  10/24/14- [place] RN, BSN [phone] post discharge call made to pt to f/u on [facility] orders for HH-PT- spoke with pt's daughter Raven Gray- per conversation daughter stated that someone had called them and arranged HH-PT- they had chosen [facility] for services and someone from [facility] was scheduled to come out to home today- no further needs from CM identified

## 2014-10-26 LAB — VITAMIN D 1,25 DIHYDROXY
Vitamin D 1, 25 (OH)2 Total: 58 pg/mL
Vitamin D2 1, 25 (OH)2: <10 pg/mL
Vitamin D3 1, 25 (OH)2: 58 pg/mL

## 2014-10-27 DIAGNOSIS — M6281 Muscle weakness (generalized): Secondary | ICD-10-CM | POA: Diagnosis not present

## 2014-10-27 DIAGNOSIS — I4891 Unspecified atrial fibrillation: Secondary | ICD-10-CM | POA: Diagnosis not present

## 2014-10-27 DIAGNOSIS — Z7901 Long term (current) use of anticoagulants: Secondary | ICD-10-CM | POA: Diagnosis not present

## 2014-10-27 DIAGNOSIS — I10 Essential (primary) hypertension: Secondary | ICD-10-CM | POA: Diagnosis not present

## 2014-10-27 DIAGNOSIS — Z86718 Personal history of other venous thrombosis and embolism: Secondary | ICD-10-CM | POA: Diagnosis not present

## 2014-10-27 DIAGNOSIS — J449 Chronic obstructive pulmonary disease, unspecified: Secondary | ICD-10-CM | POA: Diagnosis not present

## 2014-10-31 DIAGNOSIS — I4891 Unspecified atrial fibrillation: Secondary | ICD-10-CM | POA: Diagnosis not present

## 2014-10-31 DIAGNOSIS — Z86718 Personal history of other venous thrombosis and embolism: Secondary | ICD-10-CM | POA: Diagnosis not present

## 2014-10-31 DIAGNOSIS — M6281 Muscle weakness (generalized): Secondary | ICD-10-CM | POA: Diagnosis not present

## 2014-10-31 DIAGNOSIS — I10 Essential (primary) hypertension: Secondary | ICD-10-CM | POA: Diagnosis not present

## 2014-10-31 DIAGNOSIS — J449 Chronic obstructive pulmonary disease, unspecified: Secondary | ICD-10-CM | POA: Diagnosis not present

## 2014-10-31 DIAGNOSIS — Z7901 Long term (current) use of anticoagulants: Secondary | ICD-10-CM | POA: Diagnosis not present

## 2014-11-01 DIAGNOSIS — R0602 Shortness of breath: Secondary | ICD-10-CM | POA: Diagnosis not present

## 2014-11-01 DIAGNOSIS — E789 Disorder of lipoprotein metabolism, unspecified: Secondary | ICD-10-CM | POA: Diagnosis not present

## 2014-11-03 DIAGNOSIS — I4891 Unspecified atrial fibrillation: Secondary | ICD-10-CM | POA: Diagnosis not present

## 2014-11-03 DIAGNOSIS — J449 Chronic obstructive pulmonary disease, unspecified: Secondary | ICD-10-CM | POA: Diagnosis not present

## 2014-11-03 DIAGNOSIS — I10 Essential (primary) hypertension: Secondary | ICD-10-CM | POA: Diagnosis not present

## 2014-11-03 DIAGNOSIS — Z7901 Long term (current) use of anticoagulants: Secondary | ICD-10-CM | POA: Diagnosis not present

## 2014-11-03 DIAGNOSIS — M6281 Muscle weakness (generalized): Secondary | ICD-10-CM | POA: Diagnosis not present

## 2014-11-03 DIAGNOSIS — Z86718 Personal history of other venous thrombosis and embolism: Secondary | ICD-10-CM | POA: Diagnosis not present

## 2014-11-06 DIAGNOSIS — Z86718 Personal history of other venous thrombosis and embolism: Secondary | ICD-10-CM | POA: Diagnosis not present

## 2014-11-06 DIAGNOSIS — J449 Chronic obstructive pulmonary disease, unspecified: Secondary | ICD-10-CM | POA: Diagnosis not present

## 2014-11-06 DIAGNOSIS — M6281 Muscle weakness (generalized): Secondary | ICD-10-CM | POA: Diagnosis not present

## 2014-11-06 DIAGNOSIS — I4891 Unspecified atrial fibrillation: Secondary | ICD-10-CM | POA: Diagnosis not present

## 2014-11-06 DIAGNOSIS — Z7901 Long term (current) use of anticoagulants: Secondary | ICD-10-CM | POA: Diagnosis not present

## 2014-11-06 DIAGNOSIS — I10 Essential (primary) hypertension: Secondary | ICD-10-CM | POA: Diagnosis not present

## 2014-11-14 DIAGNOSIS — J449 Chronic obstructive pulmonary disease, unspecified: Secondary | ICD-10-CM | POA: Diagnosis not present

## 2014-11-14 DIAGNOSIS — I4891 Unspecified atrial fibrillation: Secondary | ICD-10-CM | POA: Diagnosis not present

## 2014-11-14 DIAGNOSIS — M6281 Muscle weakness (generalized): Secondary | ICD-10-CM | POA: Diagnosis not present

## 2014-11-14 DIAGNOSIS — I10 Essential (primary) hypertension: Secondary | ICD-10-CM | POA: Diagnosis not present

## 2014-11-14 DIAGNOSIS — Z7901 Long term (current) use of anticoagulants: Secondary | ICD-10-CM | POA: Diagnosis not present

## 2014-11-14 DIAGNOSIS — Z86718 Personal history of other venous thrombosis and embolism: Secondary | ICD-10-CM | POA: Diagnosis not present

## 2014-11-17 DIAGNOSIS — I10 Essential (primary) hypertension: Secondary | ICD-10-CM | POA: Diagnosis not present

## 2014-11-17 DIAGNOSIS — Z7901 Long term (current) use of anticoagulants: Secondary | ICD-10-CM | POA: Diagnosis not present

## 2014-11-17 DIAGNOSIS — I4891 Unspecified atrial fibrillation: Secondary | ICD-10-CM | POA: Diagnosis not present

## 2014-11-17 DIAGNOSIS — Z86718 Personal history of other venous thrombosis and embolism: Secondary | ICD-10-CM | POA: Diagnosis not present

## 2014-11-17 DIAGNOSIS — M6281 Muscle weakness (generalized): Secondary | ICD-10-CM | POA: Diagnosis not present

## 2014-11-17 DIAGNOSIS — J449 Chronic obstructive pulmonary disease, unspecified: Secondary | ICD-10-CM | POA: Diagnosis not present

## 2014-12-05 ENCOUNTER — Ambulatory Visit
Admission: RE | Admit: 2014-12-05 | Discharge: 2014-12-05 | Disposition: A | Discharge status: Home / Self Care | Payer: Medicare Other | Source: Ambulatory Visit

## 2014-12-05 DIAGNOSIS — Z1231 Encounter for screening mammogram for malignant neoplasm of breast: Secondary | ICD-10-CM

## 2014-12-15 ENCOUNTER — Ambulatory Visit (INDEPENDENT_AMBULATORY_CARE_PROVIDER_SITE_OTHER): Payer: Medicare Other | Admitting: Cardiovascular Disease

## 2014-12-15 ENCOUNTER — Encounter: Payer: Self-pay | Admitting: Cardiovascular Disease

## 2014-12-15 VITALS — BP 150/84 | HR 57 | Ht 63.0 in | Wt 192.0 lb

## 2014-12-15 DIAGNOSIS — I351 Nonrheumatic aortic (valve) insufficiency: Secondary | ICD-10-CM

## 2014-12-15 DIAGNOSIS — R079 Chest pain, unspecified: Secondary | ICD-10-CM

## 2014-12-15 DIAGNOSIS — Z79899 Other long term (current) drug therapy: Secondary | ICD-10-CM

## 2014-12-15 DIAGNOSIS — I482 Chronic atrial fibrillation, unspecified: Secondary | ICD-10-CM

## 2014-12-15 DIAGNOSIS — R609 Edema, unspecified: Secondary | ICD-10-CM | POA: Diagnosis not present

## 2014-12-15 MED ORDER — POTASSIUM CHLORIDE CRYS ER 20 MEQ PO TBCR: 40.0000 meq | EXTENDED_RELEASE_TABLET | Freq: Every day | ORAL | Status: DC

## 2014-12-15 MED ORDER — FUROSEMIDE 40 MG PO TABS: 40.0000 mg | ORAL_TABLET | Freq: Every day | ORAL | Status: DC

## 2014-12-15 NOTE — Assessment & Plan Note (Signed)
History of remote pulmonary embolism on Xarelto  oral anticoagulation

## 2014-12-15 NOTE — Assessment & Plan Note (Signed)
The patient has atypical chest pain. I performed cardiac catheterization on her in 2008 revealing normal coronary arteries. She also had a recent negative Myoview stress test. I reassured her that her chest pain is most likely noncardiac.

## 2014-12-15 NOTE — Progress Notes (Signed)
12/15/2014 Raven Gray   [birth year]  [id number]  Primary Physician [name], MD Primary Cardiologist: Lorretta Harp MD Renae Gloss   HPI:  Raven Gray is an 81 year old moderately overweight married Caucasian female mother of 4 daughters, grandmother to 18 grandchildren was accompanied by one of her daughters , Raven Gray, today.  I last saw her in the office 06/20/14. She was referred by Dr. Wilson Singer for new-onset A. Fib and chest pain. The patient has a remote history of a pulmonary embolism and DVT on Xarelto oral autoregulation. Problems include hypertension, hyperlipidemia and COPD. She does have a family history of heart disease in the father who died of an MI. She does have a daughter who's had stents. She has never had a heart attack or stroke. Over the last month she said it was a chest pain which awakened her from sleep. She also feels weak and sluggish. An EKG performed by her primary care physician revealed atrial fibrillation which was newly recognized. I performed a 2-D echo cardiogram which was normal as was a Myoview stress test. She has had several ER visits/admissions for atypical chest pain and shortness of breath. A recent 2-D echo performed 10/21/14 revealed normal LV size and function, moderate aortic insufficiency with a mildly dilated left atrium. We have discussed the possibility of outpatient cardioversion however the patient does not wish to pursue this at this time.  Current Outpatient Prescriptions  Medication Sig Dispense Refill  . ALPRAZolam (XANAX) 0.25 MG tablet Take 1 tablet (0.25 mg total) by mouth 2 (two) times daily as needed for anxiety. 30 tablet 0  . furosemide (LASIX) 40 MG tablet Take 1 tablet (40 mg total) by mouth daily. 30 tablet 6  . ibuprofen (ADVIL,MOTRIN) 200 MG tablet Take 600 mg by mouth every 6 (six) hours as needed for moderate pain.    . metoprolol (LOPRESSOR) 25 MG tablet Take 1 tablet (25 mg total) by mouth 2 (two) times daily. 180  tablet 3  . mirabegron ER (MYRBETRIQ) 50 MG TB24 Take 50 mg by mouth daily.    . pravastatin (PRAVACHOL) 40 MG tablet Take 40 mg by mouth every morning.     . Rivaroxaban (XARELTO) 20 MG TABS tablet Take 20 mg by mouth every morning.     . tiotropium (SPIRIVA) 18 MCG inhalation capsule Place 18 mcg into inhaler and inhale daily.    Marland Kitchen venlafaxine XR (EFFEXOR-XR) 150 MG 24 hr capsule Take 150 mg by mouth daily.    Marland Kitchen zolpidem (AMBIEN) 5 MG tablet Take 5 mg by mouth at bedtime.    . potassium chloride SA (K-DUR,KLOR-CON) 20 MEQ tablet Take 2 tablets (40 mEq total) by mouth daily. 60 tablet 6   No current facility-administered medications for this visit.    Allergies  Allergen Reactions  . Prednisone Other (See Comments)    Reaction-abnormal behavior "makes me feel like I'm flying"    History   Social History  . Marital Status: Widowed    Spouse Name: N/A  . Number of Children: N/A  . Years of Education: N/A   Occupational History  . Not on file.   Social History Main Topics  . Smoking status: Never Smoker   . Smokeless tobacco: Never Used  . Alcohol Use: No  . Drug Use: No  . Sexual Activity: No   Other Topics Concern  . Not on file   Social History Narrative     Review of Systems: General: negative for chills,  fever, night sweats or weight changes.  Cardiovascular: negative for chest pain, dyspnea on exertion, edema, orthopnea, palpitations, paroxysmal nocturnal dyspnea or shortness of breath Dermatological: negative for rash Respiratory: negative for cough or wheezing Urologic: negative for hematuria Abdominal: negative for nausea, vomiting, diarrhea, bright red blood per rectum, melena, or hematemesis Neurologic: negative for visual changes, syncope, or dizziness All other systems reviewed and are otherwise negative except as noted above.    Blood pressure 150/84, pulse 57, height 5\' 3"  (1.6 m), weight 192 lb (87.091 kg).  General appearance: alert and no  distress Neck: no adenopathy, no carotid bruit, no JVD, supple, symmetrical, trachea midline and thyroid not enlarged, symmetric, no tenderness/mass/nodules Lungs: clear to auscultation bilaterally Heart: irregularly irregular rhythm Extremities: 2+ bilateral lower extremity edema  EKG atrial fibrillation with a ventricular response of 57 and nonspecific ST and T-wave changes. I personally reviewed his EKG  ASSESSMENT AND PLAN:   Pulmonary embolism History of remote pulmonary embolism on Xarelto  oral anticoagulation   Edema of both legs History of bilateral lower extremity edema. She does say that she has avoided salt. She takes when necessary furosemide. I suggested that she change her furosemide to daily and include decrease her potassium repletion. We'll check lab work in 7-10 days and have her follow-up with a mid-level provider in 4-6 weeks   Aortic insufficiency Moderate aortic insufficiency by recent 2-D echocardiogram performed 10/21/14.   Atrial fibrillation New-onset atrial fibrillation approximately one year ago, rate controlled on low-dose beta blocker. She does feel weak and somewhat lethargic and occasionally gets short of breath exercises atypical chest pain. She has been on chronic oral anti-dilation. With her left atrial size is only mildly dilated. We have talked about potential outpatient cardioversion however the patient does not wish to pursue this.   Chest pain The patient has atypical chest pain. I performed cardiac catheterization on her in 2008 revealing normal coronary arteries. She also had a recent negative Myoview stress test. I reassured her that her chest pain is most likely noncardiac.       Lorretta Harp MD FACP,FACC,FAHA, [facility] 12/15/2014 11:10 AM

## 2014-12-15 NOTE — Patient Instructions (Signed)
  We will see you back in follow up in 1 month with an extender, 6 months with an extender, 1 year with Dr Gwenlyn Found.   Dr Gwenlyn Found has ordered: 1. Your physician recommends that you return for lab work in: 10 days  2. Increase lasix to 40mg  daily  3. Increase potassium to 7meq daily (take 2 tablets of 74meq daily)

## 2014-12-15 NOTE — Assessment & Plan Note (Signed)
Moderate aortic insufficiency by recent 2-D echocardiogram performed 10/21/14.

## 2014-12-15 NOTE — Assessment & Plan Note (Signed)
History of bilateral lower extremity edema. She does say that she has avoided salt. She takes when necessary furosemide. I suggested that she change her furosemide to daily and include decrease her potassium repletion. We'll check lab work in 7-10 days and have her follow-up with a mid-level provider in 4-6 weeks

## 2014-12-15 NOTE — Assessment & Plan Note (Signed)
New-onset atrial fibrillation approximately one year ago, rate controlled on low-dose beta blocker. She does feel weak and somewhat lethargic and occasionally gets short of breath exercises atypical chest pain. She has been on chronic oral anti-dilation. With her left atrial size is only mildly dilated. We have talked about potential outpatient cardioversion however the patient does not wish to pursue this.

## 2015-01-08 DIAGNOSIS — E789 Disorder of lipoprotein metabolism, unspecified: Secondary | ICD-10-CM | POA: Diagnosis not present

## 2015-01-11 ENCOUNTER — Encounter: Payer: Self-pay | Admitting: Cardiovascular Disease

## 2015-01-16 DIAGNOSIS — M4800 Spinal stenosis, site unspecified: Secondary | ICD-10-CM | POA: Diagnosis not present

## 2015-01-17 ENCOUNTER — Telehealth: Payer: Self-pay | Admitting: Cardiology

## 2015-01-17 ENCOUNTER — Ambulatory Visit: Payer: Medicare Other | Admitting: Cardiology

## 2015-01-17 NOTE — Telephone Encounter (Signed)
Mrs. Gomm is not feeling well this morning and had to cancel her appointment.  Can we call her with her lab results?

## 2015-01-17 NOTE — Telephone Encounter (Signed)
Lab results given  appointment reschedule for 02/06/15 at 3 pm

## 2015-01-23 DIAGNOSIS — R0602 Shortness of breath: Secondary | ICD-10-CM | POA: Diagnosis not present

## 2015-01-23 DIAGNOSIS — M549 Dorsalgia, unspecified: Secondary | ICD-10-CM | POA: Diagnosis not present

## 2015-01-23 DIAGNOSIS — N3281 Overactive bladder: Secondary | ICD-10-CM | POA: Diagnosis not present

## 2015-01-23 DIAGNOSIS — I10 Essential (primary) hypertension: Secondary | ICD-10-CM | POA: Diagnosis not present

## 2015-02-06 ENCOUNTER — Ambulatory Visit (INDEPENDENT_AMBULATORY_CARE_PROVIDER_SITE_OTHER): Payer: Medicare Other | Admitting: Cardiology

## 2015-02-06 ENCOUNTER — Encounter: Payer: Self-pay | Admitting: Cardiology

## 2015-02-06 VITALS — BP 132/88 | HR 76 | Ht 63.0 in | Wt 185.0 lb

## 2015-02-06 DIAGNOSIS — I509 Heart failure, unspecified: Secondary | ICD-10-CM

## 2015-02-06 DIAGNOSIS — R609 Edema, unspecified: Secondary | ICD-10-CM

## 2015-02-06 DIAGNOSIS — Z7901 Long term (current) use of anticoagulants: Secondary | ICD-10-CM | POA: Diagnosis not present

## 2015-02-06 DIAGNOSIS — I482 Chronic atrial fibrillation, unspecified: Secondary | ICD-10-CM

## 2015-02-06 MED ORDER — RIVAROXABAN 20 MG PO TABS: 20.0000 mg | ORAL_TABLET | Freq: Every morning | ORAL | Status: DC

## 2015-02-06 MED ORDER — FUROSEMIDE 40 MG PO TABS: 40.0000 mg | ORAL_TABLET | Freq: Every day | ORAL | Status: DC

## 2015-02-06 NOTE — Patient Instructions (Signed)
Your physician recommends that you schedule a follow-up appointment in: 4 WEEKS WITH APP  INCREASE FUROSEMIDE TO 60 MG (1 & 1/2 TABLETS) ONCE DAILY X 4 DAYS THEN DECREASE TO 40 MG ONCE DAILY  Your physician recommends that you HAVE LAB WORK TODAY

## 2015-02-06 NOTE — Progress Notes (Signed)
Cardiology Office Note   Date:  02/06/2015   ID:  Raven Gray, DOB 05-Oct-1927, MRN [id number]  PCP:  Raven Bolt, MD  Cardiologist: [name]    Chief Complaint  Patient presents with  . Edema    Swelling in patient's legs.      History of Present Illness: Raven Gray is a 81 y.o. female who presents for edema of lower ext.  Was recently seen by [name] and instructed to take lasix daily.  Labs were stable.  She is back today for follow up.  Hx of new-onset A. Fib 1 year ago.  The patient has a remote history of a pulmonary embolism and DVT on Xarelto oral autoregulation. Problems include hypertension, hyperlipidemia and COPD. She does have a family history of heart disease in the father who died of an MI. She does have a daughter who's had stents. She has never had a heart attack or stroke. Over the last month she said it was a chest pain which awakened her from sleep in 2015 and nuc study was normal.  She also feels weak and sluggish. An EKG performed by her primary care physician revealed atrial fibrillation which was newly recognized.  2-D echo cardiogram was normal as was a Myoview stress test. She has had several ER visits/admissions for atypical chest pain and shortness of breath. A recent 2-D echo performed 10/21/14 revealed normal LV size and function, moderate aortic insufficiency with a mildly dilated left atrium. We have discussed the possibility of outpatient cardioversion however the patient does not wish to pursue this at this time.    Today continues with lower ext edema but no SOB.  No chest pain.  She does have knee pain-followed by Ortho..   Past Medical History  Diagnosis Date  . Hypertension   . COPD (chronic obstructive pulmonary disease)   . Shortness of breath   . History of DVT (deep vein thrombosis)   . History of pulmonary embolism   . Chest pain   . Atrial fibrillation   . Hyperlipidemia   . History of depression   . Hypokalemia     Now  improved with treatment  . Aortic insufficiency     moderate by recent 2-D echocardiogram    Past Surgical History  Procedure Laterality Date  . Replacement total knee    . Rotator cuff repair       Current Outpatient Prescriptions  Medication Sig Dispense Refill  . ALPRAZolam (XANAX) 0.25 MG tablet Take 1 tablet (0.25 mg total) by mouth 2 (two) times daily as needed for anxiety. 30 tablet 0  . furosemide (LASIX) 40 MG tablet Take 1 tablet (40 mg total) by mouth daily. 30 tablet 6  . ibuprofen (ADVIL,MOTRIN) 200 MG tablet Take 600 mg by mouth every 6 (six) hours as needed for moderate pain.    . metoprolol (LOPRESSOR) 25 MG tablet Take 1 tablet (25 mg total) by mouth 2 (two) times daily. 180 tablet 3  . mirabegron ER (MYRBETRIQ) 50 MG TB24 Take 50 mg by mouth daily.    . potassium chloride SA (K-DUR,KLOR-CON) 20 MEQ tablet Take 2 tablets (40 mEq total) by mouth daily. 60 tablet 6  . pravastatin (PRAVACHOL) 40 MG tablet Take 40 mg by mouth every morning.     . Rivaroxaban (XARELTO) 20 MG TABS tablet Take 20 mg by mouth every morning.     . tiotropium (SPIRIVA) 18 MCG inhalation capsule Place 18 mcg into inhaler  and inhale daily.    Marland Kitchen venlafaxine XR (EFFEXOR-XR) 150 MG 24 hr capsule Take 150 mg by mouth daily.    Marland Kitchen zolpidem (AMBIEN) 5 MG tablet Take 5 mg by mouth at bedtime.     No current facility-administered medications for this visit.    Allergies:   Prednisone    Social History:  The patient  reports that she has never smoked. She has never used smokeless tobacco. She reports that she does not drink alcohol or use illicit drugs.   Family History:  The patient's family history includes Diabetes type II in her daughter; Heart attack in her father; Heart disease in her daughter.    ROS:  General:no colds or fevers,  weight down 5 lbs. Skin:no rashes or ulcers HEENT:no blurred vision, no congestion CV:see HPI PUL:see HPI GI:no diarrhea constipation or melena, no  indigestion GU:no hematuria, no dysuria MS:no joint pain, no claudication, + edema Neuro:no syncope, no lightheadedness Endo:no diabetes, no thyroid disease  Wt Readings from Last 3 Encounters:  02/06/15 185 lb (83.915 kg)  12/15/14 192 lb (87.091 kg)  10/21/14 187 lb 9.8 oz (85.1 kg)     PHYSICAL EXAM: VS:  BP 132/88 mmHg  Pulse 76  Ht 5\' 3"  (1.6 m)  Wt 185 lb (83.915 kg)  BMI 32.78 kg/m2 , BMI Body mass index is 32.78 kg/(m^2). General:Pleasant affect, NAD Skin:Warm and dry, brisk capillary refill HEENT:normocephalic, sclera clear, mucus membranes moist Neck:supple, no JVD, no bruits  Heart:S1S2 irreg irreg without murmur, gallup, rub or click Lungs:clear without rales, rhonchi, or wheezes ACZ:YSAYT, soft, non tender, + BS, do not palpate liver spleen or masses Ext:no lower ext edema, 2+ pedal pulses, 2+ radial pulses Neuro:alert and oriented, MAE, follows commands, + facial symmetry    EKG:  EKG is NOT  ordered today.    Recent Labs: 10/20/2014: B Natriuretic Peptide 197.7*; TSH 1.786 10/21/2014: ALT 11; BUN 21; Creatinine 0.89; Hemoglobin 13.9; Platelets 194; Potassium 3.5; Sodium 140    Lipid Panel    Component Value Date/Time   CHOL * 03/01/2007 1054    227        ATP III CLASSIFICATION:  <200     mg/dL   Desirable  200-239  mg/dL   Borderline High  >=240    mg/dL   High   TRIG 168* 03/01/2007 1054   HDL 44 03/01/2007 1054   CHOLHDL 5.2 03/01/2007 1054   VLDL 34 03/01/2007 1054   LDLCALC * 03/01/2007 1054    149        Total Cholesterol/HDL:CHD Risk Coronary Heart Disease Risk Table                     Men   Women  1/2 Average Risk   3.4   3.3       Other studies Reviewed: Additional studies/ records that were reviewed today include: previous notes, labs.   ASSESSMENT AND PLAN: Edema of both legs History of bilateral lower extremity edema. She does say that she has avoided salt. She was taking when necessary furosemide. [name]  suggested  that she change her furosemide to daily and include increased her potassium repletion. Labs were checked and she is here for follow up today.  She is down 5 lbs but still will edema.  Increase lasix to 60 mg daily for 4 days and recheck BMP.  Will adjust K+ depending on labs.  She will follow up in 4 weeks.  Atrial  fibrillation New-onset atrial fibrillation approximately one year ago, rate controlled on low-dose beta blocker. She does feel weak and somewhat lethargic and occasionally gets short of breath exercises atypical chest pain. She has been on chronic oral anti-dilation-Xarelto- CHA2DS2VASc =6 . With her left atrial size is only mildly dilated. We have talked about potential outpatient cardioversion however the patient does not wish to pursue this.  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   Aortic insufficiency Moderate aortic insufficiency by recent 2-D echocardiogram performed 10/21/14.  Normal Coronary arteries 2008 by cath and neg. nuc study 12/06/13  Current medicines are reviewed with the patient today.  The patient Has no concerns regarding medicines.  The following changes have been made:  See above Labs/ tests ordered today include:see above  Disposition:   FU:  see above  Lennie Muckle, NP  02/06/2015 3:28 PM    [facility] [place], [place], [address] [place], [place] Phone: [phone]; Fax: [phone]

## 2015-02-07 ENCOUNTER — Other Ambulatory Visit: Payer: Self-pay | Admitting: *Deleted

## 2015-02-07 DIAGNOSIS — R600 Localized edema: Secondary | ICD-10-CM

## 2015-02-07 LAB — BASIC METABOLIC PANEL WITH GFR
BUN: 21 mg/dL (ref 6–23)
CHLORIDE: 102 meq/L (ref 96–112)
CO2: 29 meq/L (ref 19–32)
Calcium: 8.9 mg/dL (ref 8.4–10.5)
Creat: 1.18 mg/dL — ABNORMAL HIGH (ref 0.50–1.10)
GFR, Est African American: 49 mL/min — ABNORMAL LOW
GFR, Est Non African American: 42 mL/min — ABNORMAL LOW
Glucose, Bld: 105 mg/dL — ABNORMAL HIGH (ref 70–99)
Potassium: 4.3 mEq/L (ref 3.5–5.3)
SODIUM: 142 meq/L (ref 135–145)

## 2015-02-12 DIAGNOSIS — Z79899 Other long term (current) drug therapy: Secondary | ICD-10-CM | POA: Diagnosis not present

## 2015-02-13 ENCOUNTER — Encounter: Payer: Self-pay | Admitting: *Deleted

## 2015-02-13 DIAGNOSIS — M4800 Spinal stenosis, site unspecified: Secondary | ICD-10-CM | POA: Diagnosis not present

## 2015-02-13 LAB — BASIC METABOLIC PANEL
BUN: 20 mg/dL (ref 6–23)
CO2: 32 meq/L (ref 19–32)
CREATININE: 1.03 mg/dL (ref 0.50–1.10)
Calcium: 8.9 mg/dL (ref 8.4–10.5)
Chloride: 102 mEq/L (ref 96–112)
Glucose, Bld: 105 mg/dL — ABNORMAL HIGH (ref 70–99)
Potassium: 4.2 mEq/L (ref 3.5–5.3)
Sodium: 141 mEq/L (ref 135–145)

## 2015-02-15 ENCOUNTER — Telehealth: Payer: Self-pay | Admitting: Cardiology

## 2015-02-15 NOTE — Telephone Encounter (Signed)
Pt advised results normal, letter was mailed. Understanding verbalized.

## 2015-02-15 NOTE — Telephone Encounter (Signed)
She would like the pt's lab result from Monday please.

## 2015-02-21 DIAGNOSIS — D3131 Benign neoplasm of right choroid: Secondary | ICD-10-CM | POA: Diagnosis not present

## 2015-02-21 DIAGNOSIS — H4011X1 Primary open-angle glaucoma, mild stage: Secondary | ICD-10-CM | POA: Diagnosis not present

## 2015-02-21 DIAGNOSIS — Z961 Presence of intraocular lens: Secondary | ICD-10-CM | POA: Diagnosis not present

## 2015-02-22 ENCOUNTER — Other Ambulatory Visit: Payer: Self-pay | Admitting: Orthopaedic Surgery

## 2015-02-22 DIAGNOSIS — M545 Low back pain, unspecified: Secondary | ICD-10-CM

## 2015-02-22 DIAGNOSIS — M48061 Spinal stenosis, lumbar region without neurogenic claudication: Secondary | ICD-10-CM

## 2015-02-28 ENCOUNTER — Ambulatory Visit
Admission: RE | Admit: 2015-02-28 | Discharge: 2015-02-28 | Disposition: A | Discharge status: Home / Self Care | Payer: Medicare Other | Source: Ambulatory Visit | Attending: Orthopaedic Surgery | Admitting: Orthopaedic Surgery

## 2015-02-28 DIAGNOSIS — M5137 Other intervertebral disc degeneration, lumbosacral region: Secondary | ICD-10-CM | POA: Diagnosis not present

## 2015-02-28 DIAGNOSIS — M48061 Spinal stenosis, lumbar region without neurogenic claudication: Secondary | ICD-10-CM

## 2015-02-28 DIAGNOSIS — M545 Low back pain, unspecified: Secondary | ICD-10-CM

## 2015-02-28 DIAGNOSIS — M4317 Spondylolisthesis, lumbosacral region: Secondary | ICD-10-CM | POA: Diagnosis not present

## 2015-02-28 DIAGNOSIS — M47817 Spondylosis without myelopathy or radiculopathy, lumbosacral region: Secondary | ICD-10-CM | POA: Diagnosis not present

## 2015-02-28 MED ORDER — DIAZEPAM 5 MG PO TABS
5.0000 mg | ORAL_TABLET | Freq: Once | ORAL | Status: AC
  Administered 2015-02-28: 5 mg via ORAL

## 2015-02-28 MED ORDER — IOHEXOL 180 MG/ML  SOLN
15.0000 mL | Freq: Once | INTRAMUSCULAR | Status: AC | PRN
  Administered 2015-02-28: 15 mL via INTRATHECAL

## 2015-02-28 MED ORDER — ONDANSETRON HCL 4 MG/2ML IJ SOLN: 4.0000 mg | Freq: Once | INTRAMUSCULAR | Status: DC

## 2015-02-28 MED ORDER — ONDANSETRON 8 MG PO TBDP
8.0000 mg | ORAL_TABLET | Freq: Once | ORAL | Status: AC
  Administered 2015-02-28: 8 mg via ORAL

## 2015-02-28 MED ORDER — MEPERIDINE HCL 100 MG/ML IJ SOLN: 75.0000 mg | Freq: Once | INTRAMUSCULAR | Status: DC

## 2015-02-28 NOTE — Progress Notes (Signed)
Pt has been off Xarelto for the past 24 hours and off Effexor for the past 2 days. Discharge explained to pt and her family.

## 2015-02-28 NOTE — Discharge Instructions (Signed)
Myelogram Discharge Instructions  1. Go home and rest quietly for the next 24 hours.  It is important to lie flat for the next 24 hours.  Get up only to go to the restroom.  You may lie in the bed or on a couch on your back, your stomach, your left side or your right side.  You may have one pillow under your head.  You may have pillows between your knees while you are on your side or under your knees while you are on your back.  2. DO NOT drive today.  Recline the seat as far back as it will go, while still wearing your seat belt, on the way home.  3. You may get up to go to the bathroom as needed.  You may sit up for 10 minutes to eat.  You may resume your normal diet and medications unless otherwise indicated.  Drink lots of extra fluids today and tomorrow.  4. The incidence of headache, nausea, or vomiting is about 5% (one in 20 patients).  If you develop a headache, lie flat and drink plenty of fluids until the headache goes away.  Caffeinated beverages may be helpful.  If you develop severe nausea and vomiting or a headache that does not go away with flat bed rest, call [phone].  5. You may resume normal activities after your 24 hours of bed rest is over; however, do not exert yourself strongly or do any heavy lifting tomorrow. If when you get up you have a headache when standing, go back to bed and force fluids for another 24 hours.  6. Call your physician for a follow-up appointment.  The results of your myelogram will be sent directly to your physician by the following day.  7. If you have any questions or if complications develop after you arrive home, please call [phone].  Discharge instructions have been explained to the patient.  The patient, or the person responsible for the patient, fully understands these instructions.      May resume Xarelto today.   May resume Venlafaxine / Effexor on March 01, 2015, after 11:00 am.

## 2015-02-28 NOTE — Progress Notes (Signed)
1145 pt became nauseated and very diaphoretic. zofram ds given. 1154 Pt back to nursing area via stretcher, feels some better. Cold pack to neck and face.

## 2015-03-02 DIAGNOSIS — M4800 Spinal stenosis, site unspecified: Secondary | ICD-10-CM | POA: Diagnosis not present

## 2015-03-09 ENCOUNTER — Telehealth: Payer: Self-pay | Admitting: *Deleted

## 2015-03-09 NOTE — Telephone Encounter (Signed)
Requesting surgical clearance:   1. Type of surgery: Epidural steroid injection  2. Surgeon: Dr Ernestina Patches from [facility]  3. Surgical date: TBA  4. Medications that need to be help: Xarelto-2 days prior to the procedure (on xarelto for DVT and AFib)  5. CAD: No he has never had a heart attack or stroke. Over the last month she said it was a chest pain which awakened her from sleep in 2015 and nuc study was normal. She also feels weak and sluggish. An EKG performed by her primary care physician revealed atrial fibrillation which was newly recognized. 2-D echo cardiogram was normal as was a Myoview stress test. She has had several ER visits/admissions for atypical chest pain and shortness of breath. A recent 2-D echo performed 10/21/14 revealed normal LV size and function, moderate aortic insufficiency with a mildly dilated left atrium.         EF: 55-60% from Echo 09-2014  6. I will defer to: Dr Gwenlyn Found

## 2015-03-13 NOTE — Telephone Encounter (Signed)
Okay to stop her Xarelto  for her epidural injection 2 days prior

## 2015-03-14 NOTE — Telephone Encounter (Signed)
Encounter routed to [facility] [facility] (Dr Ernestina Patches [phone])

## 2015-03-23 ENCOUNTER — Ambulatory Visit: Payer: Medicare Other | Admitting: Cardiology

## 2015-04-04 DIAGNOSIS — M5416 Radiculopathy, lumbar region: Secondary | ICD-10-CM | POA: Diagnosis not present

## 2015-04-04 DIAGNOSIS — M4806 Spinal stenosis, lumbar region: Secondary | ICD-10-CM | POA: Diagnosis not present

## 2015-04-04 DIAGNOSIS — M961 Postlaminectomy syndrome, not elsewhere classified: Secondary | ICD-10-CM | POA: Diagnosis not present

## 2015-04-11 ENCOUNTER — Telehealth: Payer: Self-pay

## 2015-04-11 ENCOUNTER — Telehealth: Payer: Self-pay | Admitting: Cardiovascular Disease

## 2015-04-11 NOTE — Telephone Encounter (Signed)
Patient calling the office for samples of medication:   1.  What medication and dosage are you requesting samples for?Xarelto  2.  Are you currently out of this medication? enough until Friday  3. Are you requesting samples to get you through until a mail order prescription arrives?no

## 2015-04-11 NOTE — Telephone Encounter (Signed)
Patient called in for Xarelto 20 mg samples. Gave 10 days supply.

## 2015-04-11 NOTE — Telephone Encounter (Signed)
Patient & daughter [name] notified no samples are available. Advised they contact [facility] (number provided) or call back this week.

## 2015-04-23 ENCOUNTER — Telehealth: Payer: Self-pay | Admitting: Cardiovascular Disease

## 2015-04-23 NOTE — Telephone Encounter (Signed)
Medication samples have been provided to the patient.  Drug name: xarelto 20mg   Qty: 25  LOT: [id number]  Exp.Date: 11/18  Samples left at front desk for patient pick-up. Patient/daughter notified.  Sheral Apley M 3:18 PM 04/23/2015

## 2015-04-23 NOTE — Telephone Encounter (Signed)
Patient calling the office for samples of medication:   1.  What medication and dosage are you requesting samples for? Xarelto   2.  Are you currently out of this medication? She has enough until 8/3  3. Are you requesting samples to get you through until a mail order prescription arrives? No

## 2015-05-01 DIAGNOSIS — M4800 Spinal stenosis, site unspecified: Secondary | ICD-10-CM | POA: Diagnosis not present

## 2015-05-01 DIAGNOSIS — M961 Postlaminectomy syndrome, not elsewhere classified: Secondary | ICD-10-CM | POA: Diagnosis not present

## 2015-05-01 DIAGNOSIS — M5416 Radiculopathy, lumbar region: Secondary | ICD-10-CM | POA: Diagnosis not present

## 2015-05-01 DIAGNOSIS — M4806 Spinal stenosis, lumbar region: Secondary | ICD-10-CM | POA: Diagnosis not present

## 2015-05-04 DIAGNOSIS — L820 Inflamed seborrheic keratosis: Secondary | ICD-10-CM | POA: Diagnosis not present

## 2015-05-21 ENCOUNTER — Telehealth: Payer: Self-pay | Admitting: Cardiovascular Disease

## 2015-05-21 NOTE — Telephone Encounter (Signed)
Patient needs samples of Xarelto--she is completely out of medication.

## 2015-05-21 NOTE — Telephone Encounter (Signed)
Spoke with patient's daughter who called in on behalf of her mother (patient). Informed her no samples of xarelto 20mg  are available. Advised she call [facility] [facility]. Informed her to give [place] at least a 1 week heads up if patient is getting low on xarelto if they depend on samples.

## 2015-05-22 DIAGNOSIS — E789 Disorder of lipoprotein metabolism, unspecified: Secondary | ICD-10-CM | POA: Diagnosis not present

## 2015-05-22 DIAGNOSIS — I10 Essential (primary) hypertension: Secondary | ICD-10-CM | POA: Diagnosis not present

## 2015-05-29 DIAGNOSIS — N390 Urinary tract infection, site not specified: Secondary | ICD-10-CM | POA: Diagnosis not present

## 2015-05-29 DIAGNOSIS — N3281 Overactive bladder: Secondary | ICD-10-CM | POA: Diagnosis not present

## 2015-05-29 DIAGNOSIS — R0602 Shortness of breath: Secondary | ICD-10-CM | POA: Diagnosis not present

## 2015-05-29 DIAGNOSIS — E789 Disorder of lipoprotein metabolism, unspecified: Secondary | ICD-10-CM | POA: Diagnosis not present

## 2015-06-18 ENCOUNTER — Other Ambulatory Visit: Payer: Self-pay | Admitting: Cardiovascular Disease

## 2015-06-18 NOTE — Telephone Encounter (Signed)
Patient calling the office for samples of medication:   1.  What medication and dosage are you requesting samples for? Xarelto 20mg    2.  Are you currently out of this medication? She has a weeks worth left   3. Are you requesting samples to get you through until a mail order prescription arrives? No

## 2015-06-18 NOTE — Telephone Encounter (Signed)
Returned call to patient's daughter office out of xarelto samples.Stated she did not qualify for patient assistance.Advised to call back the end of week.

## 2015-06-25 ENCOUNTER — Telehealth: Payer: Self-pay | Admitting: Cardiovascular Disease

## 2015-06-25 NOTE — Telephone Encounter (Signed)
LM that NO samples are available.

## 2015-06-25 NOTE — Telephone Encounter (Signed)
Patient calling the office for samples of medication:   1.  What medication and dosage are you requesting samples for?Xarelto  2.  Are you currently out of this medication? No-about a week worth 3. Are you requesting samples to get you through until a mail order prescription arrives?no

## 2015-07-16 ENCOUNTER — Encounter: Payer: Self-pay | Admitting: Physician Assistant

## 2015-07-16 ENCOUNTER — Ambulatory Visit (INDEPENDENT_AMBULATORY_CARE_PROVIDER_SITE_OTHER): Payer: Medicare Other | Admitting: Physician Assistant

## 2015-07-16 VITALS — BP 140/100 | HR 64 | Ht 63.0 in | Wt 196.1 lb

## 2015-07-16 DIAGNOSIS — I10 Essential (primary) hypertension: Secondary | ICD-10-CM

## 2015-07-16 DIAGNOSIS — R0602 Shortness of breath: Secondary | ICD-10-CM | POA: Diagnosis not present

## 2015-07-16 DIAGNOSIS — R600 Localized edema: Secondary | ICD-10-CM

## 2015-07-16 MED ORDER — POTASSIUM CHLORIDE CRYS ER 20 MEQ PO TBCR: EXTENDED_RELEASE_TABLET | ORAL | Status: DC

## 2015-07-16 MED ORDER — FUROSEMIDE 40 MG PO TABS: ORAL_TABLET | ORAL | Status: DC

## 2015-07-16 NOTE — Patient Instructions (Signed)
Increase Lasix to 1&1/2 tablets daily  Increase Potassium to 1 tablet three times a day   Lab work ( bmet ) in 1 week 10/31  Urinate then weigh every morning Keep diary of weights   Your physician recommends that you schedule a follow-up appointment in: 3 months with Dr.Berry

## 2015-07-16 NOTE — Progress Notes (Signed)
Cardiology Office Note   Date:  07/16/2015   ID:  Raven Gray, DOB 09/04/1928, MRN [id number]  PCP:  [name], MD  Cardiologist:  Dr Stacy Gardner, PA-C   Chief Complaint  Patient presents with  . Follow-up    6 mo//pt states she has fallen twice in the last week due to dizziness, still having some dizzines when she gets up from laying down  . Shortness of Breath    on exertion  . Edema    bilateral legs/feet/ankles; pt states she does not think LASIX is helping    History of Present Illness: Raven Gray is a 81 y.o. female with a history of PE/DVT on Xarelto, HTN, HL, COPD, LE edema. Dx afib 2015, now persistent.    Raven Gray presents for followup of LE edema, 2 recent falls, balance issues.  Raven Gray has 2 falls recently, both when getting up from the toilet. The first time, her family was nearby, there were no inJuries and the patient denies LOC. She was trying to get up from the commode after urinating and fell over. The second time, she was alone but states the same thing happened. She did not seek medical care either time. She has some areas of ecchymosis, but no other injuries.   Raven Gray has noticed a decrease in her LE edema on the Lasix. She is compliant with the Lasix and Kdur. Her daughter prepares the medications.   She loses balance easily when she turns, but there is no true dizziness or blurred vision. She also will get a little light-headed when she stands up or sits up. This has not changed recently. As long as she stands still for a few seconds when she gets up, she has no problems. She denies presyncope or syncope.  Past Medical History  Diagnosis Date  . Hypertension   . COPD (chronic obstructive pulmonary disease)   . Shortness of breath   . History of DVT (deep vein thrombosis)   . History of pulmonary embolism   . Chest pain   . Atrial fibrillation   . Hyperlipidemia   . History of depression   . Hypokalemia     Now improved with  treatment  . Aortic insufficiency     moderate by recent 2-D echocardiogram    Past Surgical History  Procedure Laterality Date  . Replacement total knee    . Rotator cuff repair      Current Outpatient Prescriptions  Medication Sig Dispense Refill  . ALPRAZolam (XANAX) 0.25 MG tablet Take 1 tablet (0.25 mg total) by mouth 2 (two) times daily as needed for anxiety. 30 tablet 0  . furosemide (LASIX) 40 MG tablet Take 1 tablet (40 mg total) by mouth daily. 30 tablet 6  . ibuprofen (ADVIL,MOTRIN) 200 MG tablet Take 600 mg by mouth every 6 (six) hours as needed for moderate pain.    . metoprolol (LOPRESSOR) 25 MG tablet Take 1 tablet (25 mg total) by mouth 2 (two) times daily. 180 tablet 3  . omeprazole (PRILOSEC) 20 MG capsule Take 20 mg by mouth as needed.  11  . oxybutynin (DITROPAN-XL) 5 MG 24 hr tablet Take 5 mg by mouth daily.  0  . potassium chloride SA (K-DUR,KLOR-CON) 20 MEQ tablet Take 2 tablets (40 mEq total) by mouth daily. 60 tablet 6  . pravastatin (PRAVACHOL) 40 MG tablet Take 40 mg by mouth every morning.     . rivaroxaban (XARELTO) 20  MG TABS tablet Take 1 tablet (20 mg total) by mouth every morning. 30 tablet 6  . tiotropium (SPIRIVA) 18 MCG inhalation capsule Place 18 mcg into inhaler and inhale daily.    Marland Kitchen venlafaxine XR (EFFEXOR-XR) 150 MG 24 hr capsule Take 150 mg by mouth daily.    Marland Kitchen zolpidem (AMBIEN) 5 MG tablet Take 5 mg by mouth at bedtime.     No current facility-administered medications for this visit.    Allergies:   Prednisone    Social History:  The patient  reports that she has never smoked. She has never used smokeless tobacco. She reports that she does not drink alcohol or use illicit drugs.   Family History:  The patient's family history includes Diabetes type II in her daughter; Heart attack in her father; Heart disease in her daughter.    ROS:  Please see the history of present illness. All other systems are reviewed and negative.    PHYSICAL  EXAM: VS:  BP 140/100 mmHg  Pulse 64  Ht 5\' 3"  (1.6 m)  Wt 196 lb 1.6 oz (88.95 kg)  BMI 34.75 kg/m2 , BMI Body mass index is 34.75 kg/(m^2). GEN: Well nourished, well developed, female in no acute distress HEENT: normal for age  Neck: no JVD, no carotid bruit, no masses Cardiac: Irreg irreg; no murmur, no rubs, or gallops Respiratory:  clear to auscultation bilaterally, normal work of breathing GI: soft, nontender, nondistended, + BS Raven: no deformity or atrophy; 1+ edema; distal pulses are 2+ in all 4 extremities  Skin: warm and dry, no rash; Ecchymosis L ear, small area L neck and R hip Neuro:  Strength and sensation are intact Psych: euthymic mood, full affect   EKG:  EKG is not ordered today.  Recent Labs: 10/20/2014: B Natriuretic Peptide 197.7*; TSH 1.786 10/21/2014: ALT 11; Hemoglobin 13.9; Platelets 194 02/12/2015: BUN 20; Creat 1.03; Potassium 4.2; Sodium 141     Wt Readings from Last 3 Encounters:  07/16/15 196 lb 1.6 oz (88.95 kg)  02/06/15 185 lb (83.915 kg)  12/15/14 192 lb (87.091 kg)     Other studies Reviewed: Additional studies/ records that were reviewed today include: ECGs, office notes and hospital records.  ASSESSMENT AND PLAN:  1.  Falls: Orthostatic VS were negative in the office today, BP is elevated at baseline. She is to continue to use walker, move slowly and get her balance when she changes position. Let us know if the symptoms get worse.   2. Atrial fib: She is this today, heart rate is controlled and she is asymptomatic. Continue Xarelto, but she relies on samples as it is cost-prohibitive. Told her we can change her to Coumadin if we are unable to get samples for her.   3. Anticoagulation: She is onchronic oral anti-dilation-Xarelto- CHA2DS2VASc =6  4. HTN: BP is elevated and she still has edema. Increase Lasix to 60 mg qd and increase Kdur to tid. Check BMET in 1 week.  Current medicines are reviewed at length with the patient today.  The  patient does not have concerns regarding medicines.  The following changes have been made:  Increase Lasix and K+  Labs/ tests ordered today include:   Orders Placed This Encounter  Procedures  . Basic metabolic panel     Disposition:   FU with Dr Gwenlyn Found  Signed, Lenoard Aden  07/16/2015 3:11 PM    [facility] [address], [place], [place]  [address] Phone: [phone];  Fax: [phone]

## 2015-07-24 DIAGNOSIS — R0602 Shortness of breath: Secondary | ICD-10-CM | POA: Diagnosis not present

## 2015-07-25 LAB — BASIC METABOLIC PANEL
BUN: 17 mg/dL (ref 7–25)
CHLORIDE: 102 mmol/L (ref 98–110)
CO2: 26 mmol/L (ref 20–31)
Calcium: 8.7 mg/dL (ref 8.6–10.4)
Creat: 1.07 mg/dL — ABNORMAL HIGH (ref 0.60–0.88)
Glucose, Bld: 145 mg/dL — ABNORMAL HIGH (ref 65–99)
Potassium: 4 mmol/L (ref 3.5–5.3)
SODIUM: 140 mmol/L (ref 135–146)

## 2015-07-30 ENCOUNTER — Telehealth: Payer: Self-pay | Admitting: Physician Assistant

## 2015-07-30 NOTE — Telephone Encounter (Signed)
Please call,would like her lab results from about 2 weeks ago. Also have question about her medicine.

## 2015-07-30 NOTE — Telephone Encounter (Signed)
Message routed to [place], [place] for result note

## 2015-08-01 DIAGNOSIS — M1712 Unilateral primary osteoarthritis, left knee: Secondary | ICD-10-CM | POA: Diagnosis not present

## 2015-08-27 ENCOUNTER — Telehealth: Payer: Self-pay | Admitting: Cardiovascular Disease

## 2015-08-27 NOTE — Telephone Encounter (Signed)
Patient calling the office for samples of medication:   1.  What medication and dosage are you requesting samples for? Xarelto 20 mg   2.  Are you currently out of this medication? Yes  [name] also wanted to speak with th nurse about the pt's BP readings. Please f/u with her Thanks

## 2015-08-27 NOTE — Telephone Encounter (Signed)
Samples left at front desk, caller aware.  Instructions regarding medications/BP checks communicated/reinforced. No further concerns.

## 2015-09-18 ENCOUNTER — Telehealth: Payer: Self-pay | Admitting: Cardiovascular Disease

## 2015-09-18 MED ORDER — RIVAROXABAN 20 MG PO TABS: 20.0000 mg | ORAL_TABLET | Freq: Every morning | ORAL | Status: DC

## 2015-09-18 NOTE — Telephone Encounter (Signed)
Spoke to daughter Samples available to pick up x 2 bottles  DAUGHTER AWARE.

## 2015-09-18 NOTE — Telephone Encounter (Signed)
Patient calling the office for samples of medication:   1.  What medication and dosage are you requesting samples for? Raven Gray  2.  Are you currently out of this medication? No,3 days left

## 2015-09-25 DIAGNOSIS — I10 Essential (primary) hypertension: Secondary | ICD-10-CM | POA: Diagnosis not present

## 2015-09-27 ENCOUNTER — Telehealth: Payer: Self-pay | Admitting: Cardiovascular Disease

## 2015-09-27 NOTE — Telephone Encounter (Signed)
Patient calling the office for samples of medication:   1.  What medication and dosage are you requesting samples for?Xarelto  2.  Are you currently out of this medication? No,3 days left

## 2015-09-28 MED ORDER — RIVAROXABAN 20 MG PO TABS: 20.0000 mg | ORAL_TABLET | Freq: Every morning | ORAL | Status: DC

## 2015-09-28 NOTE — Telephone Encounter (Signed)
Samples at front desk, caller aware.

## 2015-10-05 DIAGNOSIS — N3281 Overactive bladder: Secondary | ICD-10-CM | POA: Diagnosis not present

## 2015-10-05 DIAGNOSIS — I4891 Unspecified atrial fibrillation: Secondary | ICD-10-CM | POA: Diagnosis not present

## 2015-10-05 DIAGNOSIS — E789 Disorder of lipoprotein metabolism, unspecified: Secondary | ICD-10-CM | POA: Diagnosis not present

## 2015-10-19 DIAGNOSIS — M4800 Spinal stenosis, site unspecified: Secondary | ICD-10-CM | POA: Diagnosis not present

## 2015-10-19 DIAGNOSIS — M4806 Spinal stenosis, lumbar region: Secondary | ICD-10-CM | POA: Diagnosis not present

## 2015-10-19 DIAGNOSIS — M1712 Unilateral primary osteoarthritis, left knee: Secondary | ICD-10-CM | POA: Diagnosis not present

## 2015-10-23 ENCOUNTER — Ambulatory Visit (INDEPENDENT_AMBULATORY_CARE_PROVIDER_SITE_OTHER): Payer: Medicare Other | Admitting: Cardiovascular Disease

## 2015-10-23 ENCOUNTER — Encounter: Payer: Self-pay | Admitting: Cardiovascular Disease

## 2015-10-23 VITALS — BP 126/84 | HR 58 | Ht 63.0 in | Wt 197.0 lb

## 2015-10-23 DIAGNOSIS — E785 Hyperlipidemia, unspecified: Secondary | ICD-10-CM | POA: Diagnosis not present

## 2015-10-23 DIAGNOSIS — I10 Essential (primary) hypertension: Secondary | ICD-10-CM

## 2015-10-23 DIAGNOSIS — I482 Chronic atrial fibrillation, unspecified: Secondary | ICD-10-CM

## 2015-10-23 DIAGNOSIS — I351 Nonrheumatic aortic (valve) insufficiency: Secondary | ICD-10-CM | POA: Diagnosis not present

## 2015-10-23 NOTE — Assessment & Plan Note (Signed)
History of remote pulmonary embolus Xarelto

## 2015-10-23 NOTE — Assessment & Plan Note (Signed)
History of moderate aortic insufficiency demonstrated on 2-D echo 10/21/14. She had normal LV size and function.

## 2015-10-23 NOTE — Progress Notes (Signed)
10/23/2015 Raven Gray   [birth year]  [id number]  Primary Physician [name], MD Primary Cardiologist: Lorretta Harp MD Renae Gloss   HPI:  Raven Gray is an 82 year old moderately overweight married Caucasian female mother of 4 daughters, grandmother to 39 grandchildren was accompanied by one of her daughters , Raven Gray, today. I last saw her in the office 12/15/14. She unfortunately lost her oldest daughter June 2016 2 diabetes.She was referred by Dr. Wilson Singer for new-onset A. Fib and chest pain. The patient has a remote history of a pulmonary embolism and DVT on Xarelto oral autoregulation. Problems include hypertension, hyperlipidemia and COPD. She does have a family history of heart disease in the father who died of an MI. She does have a daughter who's had stents. She has never had a heart attack or stroke. Over the last month she said it was a chest pain which awakened her from sleep. She also feels weak and sluggish. An EKG performed by her primary care physician revealed atrial fibrillation which was newly recognized. I performed a 2-D echo cardiogram which was normal as was a Myoview stress test. She has had several ER visits/admissions for atypical chest pain and shortness of breath. A recent 2-D echo performed 10/21/14 revealed normal LV size and function, moderate aortic insufficiency with a mildly dilated left atrium. We have discussed the possibility of outpatient cardioversion however the patient does not wish to pursue this at this time.   Current Outpatient Prescriptions  Medication Sig Dispense Refill  . ALPRAZolam (XANAX) 0.25 MG tablet Take 1 tablet (0.25 mg total) by mouth 2 (two) times daily as needed for anxiety. 30 tablet 0  . furosemide (LASIX) 40 MG tablet Take 1&1/2 tablets daily 45 tablet 6  . ibuprofen (ADVIL,MOTRIN) 200 MG tablet Take 600 mg by mouth every 6 (six) hours as needed for moderate pain.    . metoprolol (LOPRESSOR) 25 MG tablet Take 1 tablet  (25 mg total) by mouth 2 (two) times daily. 180 tablet 3  . omeprazole (PRILOSEC) 20 MG capsule Take 20 mg by mouth as needed.  11  . oxybutynin (DITROPAN-XL) 5 MG 24 hr tablet Take 5 mg by mouth daily.  0  . potassium chloride SA (K-DUR,KLOR-CON) 20 MEQ tablet Take 1 tablet three times a day 90 tablet 6  . pravastatin (PRAVACHOL) 40 MG tablet Take 40 mg by mouth every morning.     . predniSONE (DELTASONE) 5 MG tablet Take 5 mg by mouth as needed.  0  . rivaroxaban (XARELTO) 20 MG TABS tablet Take 1 tablet (20 mg total) by mouth every morning. 15 tablet 0  . tiotropium (SPIRIVA) 18 MCG inhalation capsule Place 18 mcg into inhaler and inhale daily.    Marland Kitchen venlafaxine XR (EFFEXOR-XR) 150 MG 24 hr capsule Take 150 mg by mouth daily.    Marland Kitchen zolpidem (AMBIEN) 5 MG tablet Take 5 mg by mouth at bedtime.     No current facility-administered medications for this visit.    Allergies  Allergen Reactions  . Prednisone Other (See Comments)    Reaction-abnormal behavior "makes me feel like I'm flying"    Social History   Social History  . Marital Status: Widowed    Spouse Name: N/A  . Number of Children: N/A  . Years of Education: N/A   Occupational History  . Not on file.   Social History Main Topics  . Smoking status: Never Smoker   . Smokeless tobacco: Never Used  .  Alcohol Use: No  . Drug Use: No  . Sexual Activity: No   Other Topics Concern  . Not on file   Social History Narrative     Review of Systems: General: negative for chills, fever, night sweats or weight changes.  Cardiovascular: negative for chest pain, dyspnea on exertion, edema, orthopnea, palpitations, paroxysmal nocturnal dyspnea or shortness of breath Dermatological: negative for rash Respiratory: negative for cough or wheezing Urologic: negative for hematuria Abdominal: negative for nausea, vomiting, diarrhea, bright red blood per rectum, melena, or hematemesis Neurologic: negative for visual changes, syncope, or  dizziness All other systems reviewed and are otherwise negative except as noted above.    Blood pressure 126/84, pulse 58, height 5\' 3"  (1.6 m), weight 197 lb (89.359 kg).  General appearance: alert and no distress Neck: no adenopathy, no carotid bruit, no JVD, supple, symmetrical, trachea midline and thyroid not enlarged, symmetric, no tenderness/mass/nodules Lungs: clear to auscultation bilaterally Heart: irregularly irregular rhythm Extremities: clinic 1-2+ pitting edema bilaterally  EKG atrial fibrillation with a ventricular response of 58 and right bundle branch block. I personally reviewed this EKG  ASSESSMENT AND PLAN:   Pulmonary embolism History of remote pulmonary embolus Xarelto   Atrial fibrillation History of chronic A. Fib rate controlled on Xarelto oral anticoagulation.  Aortic insufficiency History of moderate aortic insufficiency demonstrated on 2-D echo 10/21/14. She had normal LV size and function.  Hyperlipidemia History of hyperlipidemia on pravastatin followed by her PCP      Lorretta Harp MD [facility], [facility] 10/23/2015 11:20 AM

## 2015-10-23 NOTE — Assessment & Plan Note (Signed)
History of hyperlipidemia on pravastatin followed by her PCP 

## 2015-10-23 NOTE — Assessment & Plan Note (Signed)
History of chronic A. Fib rate controlled on Xarelto  oral anticoagulation. 

## 2015-10-23 NOTE — Patient Instructions (Signed)

## 2015-11-26 ENCOUNTER — Telehealth: Payer: Self-pay | Admitting: Cardiovascular Disease

## 2015-11-26 NOTE — Telephone Encounter (Signed)
Medication samples have been provided to the patient.  Drug name: xarelto 20mg   Qty: 4 bottles (28)  LOT: [id number]  Exp.Date: 04/2018  Samples left at front desk for patient pick-up. Patient notified. Could not get thru to [facility] or patient - called daughter Raven Gray 12:44 PM 11/26/2015

## 2015-11-26 NOTE — Telephone Encounter (Signed)
Patient calling the office for samples of medication: ° ° °1.  What medication and dosage are you requesting samples for? Xarelto  20mg ° °2.  Are you currently out of this medication? no ° ° ° °

## 2016-01-02 ENCOUNTER — Other Ambulatory Visit: Payer: Self-pay | Admitting: Cardiovascular Disease

## 2016-01-02 NOTE — Telephone Encounter (Signed)
° ° °

## 2016-01-02 NOTE — Telephone Encounter (Signed)
Medication samples have been provided to the patient.  Drug name: xarelto  Qty: 21 tablets (3 bottles)  LOT: [id number]  Exp.Date: 08/19  Samples left at front desk for patient pick-up. LM for sandra that samples were ready for pick up  Fidel Levy 4:36 PM 01/02/2016

## 2016-01-22 DIAGNOSIS — M1712 Unilateral primary osteoarthritis, left knee: Secondary | ICD-10-CM | POA: Diagnosis not present

## 2016-01-22 DIAGNOSIS — M25562 Pain in left knee: Secondary | ICD-10-CM | POA: Diagnosis not present

## 2016-01-23 ENCOUNTER — Other Ambulatory Visit: Payer: Self-pay

## 2016-01-23 DIAGNOSIS — Z1231 Encounter for screening mammogram for malignant neoplasm of breast: Secondary | ICD-10-CM

## 2016-01-25 DIAGNOSIS — I10 Essential (primary) hypertension: Secondary | ICD-10-CM | POA: Diagnosis not present

## 2016-01-25 DIAGNOSIS — R5383 Other fatigue: Secondary | ICD-10-CM | POA: Diagnosis not present

## 2016-01-25 DIAGNOSIS — E789 Disorder of lipoprotein metabolism, unspecified: Secondary | ICD-10-CM | POA: Diagnosis not present

## 2016-01-30 ENCOUNTER — Telehealth: Payer: Self-pay | Admitting: Cardiovascular Disease

## 2016-01-30 NOTE — Telephone Encounter (Signed)
Patient's daughter [name] called in to [place], RN phone # and LM that her mother Raven Gray needed xarelto 20mg  samples  Medication samples have been provided to the patient.  Drug name: xarelto 20mg   Qty: 4 bottles = 28 tabs  LOT: [id number]  Exp.Date: 04/2018  Samples left at front desk for patient pick-up. Patient notified.  Sheral Apley M 2:24 PM 01/30/2016

## 2016-02-04 DIAGNOSIS — I10 Essential (primary) hypertension: Secondary | ICD-10-CM | POA: Diagnosis not present

## 2016-02-04 DIAGNOSIS — E789 Disorder of lipoprotein metabolism, unspecified: Secondary | ICD-10-CM | POA: Diagnosis not present

## 2016-02-04 DIAGNOSIS — I4891 Unspecified atrial fibrillation: Secondary | ICD-10-CM | POA: Diagnosis not present

## 2016-02-04 DIAGNOSIS — F419 Anxiety disorder, unspecified: Secondary | ICD-10-CM | POA: Diagnosis not present

## 2016-02-07 ENCOUNTER — Telehealth: Payer: Self-pay | Admitting: Cardiovascular Disease

## 2016-02-07 NOTE — Telephone Encounter (Signed)
Spoke with patient daughter and patient recently saw her PCP who gave her Rx for NTG  She has been having sharp pains that actually take her breath away During the pain she has tingling in her chest area, up neck, and down her left arm Daughter unsure how often this happens. Does not think it is daily but does happen sometimes multiple times a day She has shortness of breath but has all the time, no change in that Does know check blood pressure at home Advised to get NTG filled and use during these times.  Did move appointment up to next week with Katie T PA, call back if worse Per daughter no known CAD

## 2016-02-07 NOTE — Telephone Encounter (Signed)
New Message  Pt dtr calling to speak w./ RN- stated pt c/o of 'tingling' in chest that travels to her neck and left arm- pt was sched next avail appt w/ Dr Gwenlyn Found- 6/9- wanted to discuss symptoms w/ RN. Please call back and discuss.

## 2016-02-13 NOTE — Progress Notes (Signed)
Cardiology Office Note   Date:  02/14/2016   ID:  Raven Gray, DOB 1928-04-09, MRN [id number]  PCP:  [name], MD  Cardiologist:  Dr. Gwenlyn Found    Chief Complaint  Patient presents with  . Chest Pain    comes and goes      History of Present Illness: Raven Gray is a 82 y.o. female who presents for tingling in chest that travels to neck and left arm.    She had a hx of new-onset A. Fib in 11/2013 and chest pain. The patient has a remote history of a pulmonary embolism and DVT on Xarelto oral autoregulation. Problems include hypertension, hyperlipidemia and COPD. She had a cardiac cath in 2008 that revealed patent Cors.  She does have a family history of heart disease in the father who died of an MI. She does have a daughter who's had stents. She has never had a heart attack or stroke. She also feels weak and sluggish. An EKG performed by her primary care physician revealed atrial fibrillation which was newly recognized. In 2015.   Dr. Gwenlyn Found performed a 2-D echo cardiogram which was normal as was a Myoview stress test. She has had several ER visits/admissions for atypical chest pain and shortness of breath. A recent 2-D echo performed 10/21/14 revealed normal LV size and function, moderate aortic insufficiency with a mildly dilated left atrium. Dr. Gwenlyn Found had discussed the possibility of outpatient cardioversion however the patient did not wish to pursue.  Her EKG reveals new RBBB that was noted in Jan 2017.    She is having more DOE and chest pressure with radiation to Lt arm and into neck.   These episodes are with nausea at times.  They last 15-20 min.  They come with rest and exertion but do not awaken from sleep.   She has not taken her sl NTG.  She had labs done with Dr. Wilson Singer 2-3 weeks ago.   Past Medical History  Diagnosis Date  . Hypertension   . COPD (chronic obstructive pulmonary disease) ([place])   . Shortness of breath   . History of DVT (deep vein thrombosis)   . History  of pulmonary embolism   . Chest pain   . Atrial fibrillation ([place])   . Hyperlipidemia   . History of depression   . Hypokalemia     Now improved with treatment  . Aortic insufficiency     moderate by recent 2-D echocardiogram    Past Surgical History  Procedure Laterality Date  . Replacement total knee    . Rotator cuff repair       Current Outpatient Prescriptions  Medication Sig Dispense Refill  . ALPRAZolam (XANAX) 0.25 MG tablet Take 1 tablet (0.25 mg total) by mouth 2 (two) times daily as needed for anxiety. 30 tablet 0  . furosemide (LASIX) 40 MG tablet Take 1&1/2 tablets daily 45 tablet 6  . ibuprofen (ADVIL,MOTRIN) 200 MG tablet Take 600 mg by mouth every 6 (six) hours as needed for moderate pain.    . metoprolol (LOPRESSOR) 25 MG tablet Take 1 tablet (25 mg total) by mouth 2 (two) times daily. 180 tablet 3  . nitroGLYCERIN (NITROSTAT) 0.4 MG SL tablet Place 1 tablet under the tongue as needed. X 3 doses  0  . omeprazole (PRILOSEC) 20 MG capsule Take 20 mg by mouth as needed.  11  . oxybutynin (DITROPAN-XL) 5 MG 24 hr tablet Take 5 mg by mouth daily.  0  . potassium chloride SA (K-DUR,KLOR-CON) 20 MEQ tablet Take 1 tablet three times a day 90 tablet 6  . pravastatin (PRAVACHOL) 40 MG tablet Take 40 mg by mouth every morning.     . rivaroxaban (XARELTO) 20 MG TABS tablet Take 1 tablet (20 mg total) by mouth every morning. 15 tablet 0  . tiotropium (SPIRIVA) 18 MCG inhalation capsule Place 18 mcg into inhaler and inhale daily.    Marland Kitchen venlafaxine XR (EFFEXOR-XR) 150 MG 24 hr capsule Take 150 mg by mouth daily.    Marland Kitchen zolpidem (AMBIEN) 5 MG tablet Take 5 mg by mouth at bedtime.    . isosorbide mononitrate (IMDUR) 30 MG 24 hr tablet Take 1 tablet (30 mg total) by mouth daily. 30 tablet 11   No current facility-administered medications for this visit.    Allergies:   Prednisone    Social History:  The patient  reports that she has never smoked. She has never used smokeless  tobacco. She reports that she does not drink alcohol or use illicit drugs.   Family History:  The patient's family history includes Diabetes type II in her daughter; Heart attack in her father; Heart disease in her daughter.    ROS:  General:no colds or fevers, no weight changes Skin:no rashes or ulcers HEENT:no blurred vision, no congestion CV:see HPI PUL:see HPI GI:no diarrhea constipation or melena, no indigestion GU:no hematuria, no dysuria MS:no joint pain, no claudication Neuro:no syncope, no lightheadedness Endo:no diabetes, no thyroid disease  Wt Readings from Last 3 Encounters:  02/14/16 195 lb (88.451 kg)  10/23/15 197 lb (89.359 kg)  07/16/15 196 lb 1.6 oz (88.95 kg)     PHYSICAL EXAM: VS:  BP 118/82 mmHg  Pulse 65  Ht 5\' 3"  (1.6 m)  Wt 195 lb (88.451 kg)  BMI 34.55 kg/m2 , BMI Body mass index is 34.55 kg/(m^2). General:Pleasant affect, NAD Skin:Warm and dry, brisk capillary refill HEENT:normocephalic, sclera clear, mucus membranes moist Neck:supple, no JVD, no bruits  Heart:irreg irreg without murmur, gallup, rub or click Lungs:clear without rales, rhonchi, or wheezes [id number], non tender, + BS, do not palpate liver spleen or masses Ext:+ lower ext edema chronic during the dayi, 1+ pedal pulses, 2+ radial pulses Neuro:alert and oriented X 3, MAE, follows commands, + facial symmetry    EKG:  EKG is ordered today. The ekg ordered today demonstrates a fib with RBBB. No changes from 09/2015.    Recent Labs: 07/24/2015: BUN 17; Creat 1.07*; Potassium 4.0; Sodium 140    Lipid Panel    Component Value Date/Time   CHOL * 03/01/2007 1054    227        ATP III CLASSIFICATION:  <200     mg/dL   Desirable  200-239  mg/dL   Borderline High  >=240    mg/dL   High   TRIG 168* 03/01/2007 1054   HDL 44 03/01/2007 1054   CHOLHDL 5.2 03/01/2007 1054   VLDL 34 03/01/2007 1054   LDLCALC * 03/01/2007 1054    149        Total Cholesterol/HDL:CHD Risk Coronary Heart  Disease Risk Table                     Men   Women  1/2 Average Risk   3.4   3.3       Other studies Reviewed: Additional studies/ records that were reviewed today include: cath 2008, nuc study 2015 and Recent echo.  Marland Kitchen  ASSESSMENT AND PLAN:  1.  Angina with DOE.  Discussed with DOD Dr. Johnsie Cancel, will add imdur and plan lexiscan myoview.  Discussed cath but with her age will try medication and eval for ischemia.  She will follow up with Dr. Gwenlyn Found.  2. Atrial fibrillation- permanent History of chronic A. Fib rate controlled on Xarelto oral anticoagulation.  Labs were done with Dr. Wilson Singer. Have asked for  3. Aortic insufficiency History of moderate aortic insufficiency demonstrated on 2-D echo 10/21/14. She had normal LV size and function.  4. Hyperlipidemia History of hyperlipidemia on pravastatin followed by her PCP  5. Pulmonary embolism History of remote pulmonary embolus Xarelto    Current medicines are reviewed with the patient today.  The patient Has no concerns regarding medicines.  The following changes have been made:  See above Labs/ tests ordered today include:see above  Disposition:   FU:  see above  Signed, Cecilie Kicks, NP  02/14/2016 9:58 AM    [facility], [place], [place] [place], [place] Phone: [phone]; Fax: [phone]

## 2016-02-14 ENCOUNTER — Ambulatory Visit: Payer: Self-pay | Admitting: Physician Assistant

## 2016-02-14 ENCOUNTER — Ambulatory Visit (INDEPENDENT_AMBULATORY_CARE_PROVIDER_SITE_OTHER): Payer: Medicare Other | Admitting: Cardiology

## 2016-02-14 ENCOUNTER — Encounter: Payer: Self-pay | Admitting: Cardiology

## 2016-02-14 VITALS — BP 118/82 | HR 65 | Ht 63.0 in | Wt 195.0 lb

## 2016-02-14 DIAGNOSIS — R0602 Shortness of breath: Secondary | ICD-10-CM | POA: Diagnosis not present

## 2016-02-14 DIAGNOSIS — R079 Chest pain, unspecified: Secondary | ICD-10-CM | POA: Diagnosis not present

## 2016-02-14 DIAGNOSIS — I482 Chronic atrial fibrillation, unspecified: Secondary | ICD-10-CM

## 2016-02-14 DIAGNOSIS — I4891 Unspecified atrial fibrillation: Secondary | ICD-10-CM

## 2016-02-14 DIAGNOSIS — E785 Hyperlipidemia, unspecified: Secondary | ICD-10-CM

## 2016-02-14 DIAGNOSIS — I209 Angina pectoris, unspecified: Secondary | ICD-10-CM

## 2016-02-14 DIAGNOSIS — I351 Nonrheumatic aortic (valve) insufficiency: Secondary | ICD-10-CM

## 2016-02-14 DIAGNOSIS — I10 Essential (primary) hypertension: Secondary | ICD-10-CM

## 2016-02-14 DIAGNOSIS — Z7901 Long term (current) use of anticoagulants: Secondary | ICD-10-CM

## 2016-02-14 MED ORDER — ISOSORBIDE MONONITRATE ER 30 MG PO TB24: 30.0000 mg | ORAL_TABLET | Freq: Every day | ORAL | Status: DC

## 2016-02-14 NOTE — Patient Instructions (Signed)
Your physician has recommended you make the following change in your medication:  1.) start isosorbide (Imdur) 30 mg --take one tablet once a day  Your physician has requested that you have a lexiscan myoview. For further information please visit [email or web address]. Please follow instruction sheet, as given.  Your physician recommends that you schedule a follow-up appointment in: 2-3 weeks with Dr. Gwenlyn Found.

## 2016-02-15 NOTE — Telephone Encounter (Signed)
Pt seen by Cecilie Kicks on 02/14/16.

## 2016-02-20 ENCOUNTER — Telehealth (HOSPITAL_COMMUNITY): Payer: Self-pay | Admitting: *Deleted

## 2016-02-20 NOTE — Telephone Encounter (Signed)
Patient's daughter [name] given detailed instructions per Myocardial Perfusion Study Information Sheet for the test on 02/22/16 Patient notified to arrive 15 minutes early and that it is imperative to arrive on time for appointment to keep from having the test rescheduled.  If you need to cancel or reschedule your appointment, please call the office within 24 hours of your appointment. Failure to do so may result in a cancellation of your appointment, and a $50 no show fee. Patient verbalized understanding.Hubbard Robinson, RN

## 2016-02-22 ENCOUNTER — Ambulatory Visit (HOSPITAL_COMMUNITY): Payer: Medicare Other | Attending: Cardiology

## 2016-02-22 DIAGNOSIS — I209 Angina pectoris, unspecified: Secondary | ICD-10-CM | POA: Insufficient documentation

## 2016-02-22 DIAGNOSIS — I4891 Unspecified atrial fibrillation: Secondary | ICD-10-CM | POA: Insufficient documentation

## 2016-02-22 DIAGNOSIS — I509 Heart failure, unspecified: Secondary | ICD-10-CM | POA: Diagnosis not present

## 2016-02-22 LAB — MYOCARDIAL PERFUSION IMAGING
CHL CUP NUCLEAR SDS: 1
CHL CUP RESTING HR STRESS: 78 {beats}/min
CSEPPHR: 90 {beats}/min
LHR: 0.3
LVDIAVOL: 115 mL (ref 46–106)
LVSYSVOL: 55 mL
SRS: 2
SSS: 3
TID: 1.06

## 2016-02-22 MED ORDER — TECHNETIUM TC 99M TETROFOSMIN IV KIT
11.0000 | PACK | Freq: Once | INTRAVENOUS | Status: AC | PRN
  Administered 2016-02-22: 11 via INTRAVENOUS
  Filled 2016-02-22: qty 11

## 2016-02-22 MED ORDER — TECHNETIUM TC 99M TETROFOSMIN IV KIT
31.9000 | PACK | Freq: Once | INTRAVENOUS | Status: AC | PRN
Start: 2016-02-22 — End: 2016-02-22
  Administered 2016-02-22: 31.9 via INTRAVENOUS
  Filled 2016-02-22: qty 32

## 2016-02-22 MED ORDER — REGADENOSON 0.4 MG/5ML IV SOLN
0.4000 mg | Freq: Once | INTRAVENOUS | Status: AC
  Administered 2016-02-22: 0.4 mg via INTRAVENOUS

## 2016-02-26 ENCOUNTER — Other Ambulatory Visit: Payer: Self-pay | Admitting: Cardiology

## 2016-02-29 ENCOUNTER — Telehealth: Payer: Self-pay | Admitting: *Deleted

## 2016-02-29 ENCOUNTER — Encounter: Payer: Self-pay | Admitting: Cardiovascular Disease

## 2016-02-29 ENCOUNTER — Ambulatory Visit (INDEPENDENT_AMBULATORY_CARE_PROVIDER_SITE_OTHER): Payer: Medicare Other | Admitting: Cardiovascular Disease

## 2016-02-29 VITALS — BP 112/80 | HR 50 | Ht 63.0 in | Wt 194.2 lb

## 2016-02-29 DIAGNOSIS — R079 Chest pain, unspecified: Secondary | ICD-10-CM | POA: Diagnosis not present

## 2016-02-29 DIAGNOSIS — E785 Hyperlipidemia, unspecified: Secondary | ICD-10-CM | POA: Diagnosis not present

## 2016-02-29 MED ORDER — ISOSORBIDE MONONITRATE ER 60 MG PO TB24: 60.0000 mg | ORAL_TABLET | Freq: Every day | ORAL | Status: DC

## 2016-02-29 NOTE — Assessment & Plan Note (Signed)
History of chest pain when I saw her 6 months ago and again when she saw Cecilie Kicks recently with radiation to her jaw and neck. She did have a Myoview stress test recently which was low risk. She does not wish to pursue an invasive approach at this time which I cannot disagree with. I'm going to increase her Imdur from 30-60 mg a day I will follow her her closely as an outpatient.

## 2016-02-29 NOTE — Assessment & Plan Note (Signed)
History of hyperlipidemia on statin therapy followed by her PCP. 

## 2016-02-29 NOTE — Telephone Encounter (Signed)
Samples given to patient as she was leaving from her appt today.  Medication Samples have been provided to the patient.  Drug name: Xarelto 20mg        Strength: 20mg         Qty: 14 tablets (2 bottles)  LOT: [id number]  Exp.Date: 08/19   Dosing instructions: Take 1 tablet by mouth daily.  The patient has been instructed regarding the correct time, dose, and frequency of taking this medication, including desired effects and most common side effects.   Roney Jaffe 12:55 PM 02/29/2016

## 2016-02-29 NOTE — Patient Instructions (Addendum)
Medication Instructions:  Your physician has recommended you make the following change in your medication:  1- INCREASE Imdur (isosorbide) to 60 mg by mouth daily.   Labwork: none  Testing/Procedures: none  Follow-Up: We request that you follow-up in: 1 month with Cecilie Kicks and in 3 months with Dr Andria Rhein will receive a reminder letter in the mail two months in advance. If you don't receive a letter, please call our office to schedule the follow-up appointment.    Any Other Special Instructions Will Be Listed Below (If Applicable).     If you need a refill on your cardiac medications before your next appointment, please call your pharmacy.

## 2016-02-29 NOTE — Progress Notes (Signed)
02/29/2016 Raven Gray   12/26/1927  [id number]  Primary Physician [name], MD Primary Cardiologist: Lorretta Harp MD Renae Gloss  HPI:  Raven Gray is an 82 year old moderately overweight married Caucasian female mother of 4 daughters, grandmother to 72 grandchildren was accompanied by one of her daughters , Raven Gray and Raven Gray, today. I last saw her in the office 10/23/15. She unfortunately lost her oldest daughter Raven Gray 2016 2 diabetes.She was referred by Dr. Wilson Singer for new-onset A. Fib and chest pain. The patient has a remote history of a pulmonary embolism and DVT on Xarelto oral autoregulation. Problems include hypertension, hyperlipidemia and COPD. She does have a family history of heart disease in the father who died of an MI. She does have a daughter who's had stents. She has never had a heart attack or stroke. Over the last month she said it was a chest pain which awakened her from sleep. She also feels weak and sluggish. An EKG performed by her primary care physician revealed atrial fibrillation which was newly recognized. I performed a 2-D echo cardiogram which was normal as was a Myoview stress test. She has had several ER visits/admissions for atypical chest pain and shortness of breath. A recent 2-D echo performed 10/21/14 revealed normal LV size and function, moderate aortic insufficiency with a mildly dilated left atrium. We have discussed the possibility of outpatient cardioversion however the patient does not wish to pursue this at this time. Because of ongoing chest pain she had a Myoview stress test performed 02/22/16 which was low risk. Because of ongoing chest pain which does have ischemic discuss the possibility of performing outpatient cardiac catheterization which the patient currently does not wish to pursue. As result of that I elected to increase her Imdur from 30-60 mg a day and will follow her clinically closely as an outpatient.   Current Outpatient  Prescriptions  Medication Sig Dispense Refill  . ALPRAZolam (XANAX) 0.25 MG tablet Take 1 tablet (0.25 mg total) by mouth 2 (two) times daily as needed for anxiety. 30 tablet 0  . furosemide (LASIX) 40 MG tablet Take 1&1/2 tablets daily 45 tablet 6  . ibuprofen (ADVIL,MOTRIN) 200 MG tablet Take 600 mg by mouth every 6 (six) hours as needed for moderate pain.    . isosorbide mononitrate (IMDUR) 30 MG 24 hr tablet Take 1 tablet (30 mg total) by mouth daily. 30 tablet 11  . metoprolol (LOPRESSOR) 25 MG tablet Take 1 tablet (25 mg total) by mouth 2 (two) times daily. 180 tablet 3  . nitroGLYCERIN (NITROSTAT) 0.4 MG SL tablet Place 1 tablet under the tongue as needed. X 3 doses  0  . omeprazole (PRILOSEC) 20 MG capsule Take 20 mg by mouth as needed.  11  . oxybutynin (DITROPAN-XL) 5 MG 24 hr tablet Take 5 mg by mouth daily.  0  . potassium chloride SA (K-DUR,KLOR-CON) 20 MEQ tablet Take 1 tablet three times a day 90 tablet 6  . pravastatin (PRAVACHOL) 40 MG tablet Take 40 mg by mouth every morning.     . rivaroxaban (XARELTO) 20 MG TABS tablet Take 1 tablet (20 mg total) by mouth every morning. 15 tablet 0  . tiotropium (SPIRIVA) 18 MCG inhalation capsule Place 18 mcg into inhaler and inhale daily.    Marland Kitchen venlafaxine XR (EFFEXOR-XR) 150 MG 24 hr capsule Take 150 mg by mouth daily.    Marland Kitchen zolpidem (AMBIEN) 5 MG tablet Take 5 mg by mouth at bedtime.  No current facility-administered medications for this visit.    No Active Allergies  Social History   Social History  . Marital Status: Widowed    Spouse Name: N/A  . Number of Children: N/A  . Years of Education: N/A   Occupational History  . Not on file.   Social History Main Topics  . Smoking status: Never Smoker   . Smokeless tobacco: Never Used  . Alcohol Use: No  . Drug Use: No  . Sexual Activity: No   Other Topics Concern  . Not on file   Social History Narrative     Review of Systems: General: negative for chills, fever,  night sweats or weight changes.  Cardiovascular: negative for chest pain, dyspnea on exertion, edema, orthopnea, palpitations, paroxysmal nocturnal dyspnea or shortness of breath Dermatological: negative for rash Respiratory: negative for cough or wheezing Urologic: negative for hematuria Abdominal: negative for nausea, vomiting, diarrhea, bright red blood per rectum, melena, or hematemesis Neurologic: negative for visual changes, syncope, or dizziness All other systems reviewed and are otherwise negative except as noted above.    Blood pressure 112/80, pulse 50, height 5\' 3"  (1.6 m), weight 194 lb 3.2 oz (88.089 kg).  General appearance: alert and no distress Neck: no adenopathy, no carotid bruit, no JVD, supple, symmetrical, trachea midline and thyroid not enlarged, symmetric, no tenderness/mass/nodules Lungs: clear to auscultation bilaterally Heart: irregularly irregular rhythm Extremities: extremities normal, atraumatic, no cyanosis or edema  EKG atrial fibrillation with a ventricular response of 50 and regular branch block unchanged from prior EKGs. I personally reviewed this EKG  ASSESSMENT AND PLAN:   Chest pain History of chest pain when I saw her 6 months ago and again when she saw Cecilie Kicks recently with radiation to her jaw and neck. She did have a Myoview stress test recently which was low risk. She does not wish to pursue an invasive approach at this time which I cannot disagree with. I'm going to increase her Imdur from 30-60 mg a day I will follow her her closely as an outpatient.  Hyperlipidemia History of hyperlipidemia on statin therapy followed by her PCP      Lorretta Harp MD [facility], [facility] 02/29/2016 12:44 PM

## 2016-03-03 ENCOUNTER — Ambulatory Visit
Admission: RE | Admit: 2016-03-03 | Discharge: 2016-03-03 | Disposition: A | Discharge status: Home / Self Care | Payer: Medicare Other | Source: Ambulatory Visit

## 2016-03-03 DIAGNOSIS — Z1231 Encounter for screening mammogram for malignant neoplasm of breast: Secondary | ICD-10-CM | POA: Diagnosis not present

## 2016-03-05 DIAGNOSIS — D3131 Benign neoplasm of right choroid: Secondary | ICD-10-CM | POA: Diagnosis not present

## 2016-03-05 DIAGNOSIS — H401131 Primary open-angle glaucoma, bilateral, mild stage: Secondary | ICD-10-CM | POA: Diagnosis not present

## 2016-03-05 DIAGNOSIS — Z961 Presence of intraocular lens: Secondary | ICD-10-CM | POA: Diagnosis not present

## 2016-03-30 NOTE — Progress Notes (Signed)
Cardiology Office Note   Date:  03/31/2016   ID:  Raven Gray, DOB Oct 18, 1927, MRN [id number]  PCP:  Raven Bolt, MD  Cardiologist:  Dr. Gwenlyn Found    Chief Complaint  Patient presents with  . Chest Pain    better      History of Present Illness: Raven Gray is a 82 y.o. female who presents for chest pain follow up..  A. Fib and chest pain. The patient has a remote history of a pulmonary embolism and DVT on Xarelto oral anticoagulation.   Problems include hypertension, hyperlipidemia and COPD. She does have a family history of heart disease in the father who died of an MI. She does have a daughter who's had stents. She has never had a heart attack or stroke. Over the last month she said it was a chest pain which awakened her from sleep. She also feels weak and sluggish. She now has chronic a fib. .  A 2-D echo cardiogram which was normal as was a Myoview stress test. She has had several ER visits/admissions for atypical chest pain and shortness of breath. A  2-D echo performed 10/21/14 revealed normal LV size and function, moderate aortic insufficiency with a mildly dilated left atrium.  Discussion of the possibility of outpatient cardioversion however the patient does not wish to pursue this at this time. Because of ongoing chest pain she had a Myoview stress test performed 02/22/16 which was low risk. Because of ongoing chest pain which may be due to ischemia [name]  discussed the possibility of performing outpatient cardiac catheterization which the patient currently does not wish to pursue. As result of that he increased her Imdur from 30-60 mg a day and she is back today for follow up.    Her sharp pains have improved and when they do occur she gets up and walks and they improve.  Her BP is borderline so will not increase.  I have asked her to take Prilosec daily for a week to see if it helps with the pain.  She agrees if it does she will take daily.  She is still with SOB more with  exertion.      Past Medical History  Diagnosis Date  . Hypertension   . COPD (chronic obstructive pulmonary disease) ([place])   . Shortness of breath   . History of DVT (deep vein thrombosis)   . History of pulmonary embolism   . Chest pain   . Atrial fibrillation ([place])   . Hyperlipidemia   . History of depression   . Hypokalemia     Now improved with treatment  . Aortic insufficiency     moderate by recent 2-D echocardiogram    Past Surgical History  Procedure Laterality Date  . Replacement total knee    . Rotator cuff repair       Current Outpatient Prescriptions  Medication Sig Dispense Refill  . ALPRAZolam (XANAX) 0.25 MG tablet Take 1 tablet (0.25 mg total) by mouth 2 (two) times daily as needed for anxiety. 30 tablet 0  . furosemide (LASIX) 40 MG tablet Take 60 mg by mouth daily.    Marland Kitchen ibuprofen (ADVIL,MOTRIN) 200 MG tablet Take 600 mg by mouth every 6 (six) hours as needed for moderate pain.    . isosorbide mononitrate (IMDUR) 60 MG 24 hr tablet Take 1 tablet (60 mg total) by mouth daily. 90 tablet 3  . metoprolol (LOPRESSOR) 25 MG tablet Take 1 tablet (25 mg total)  by mouth 2 (two) times daily. 180 tablet 3  . Multiple Vitamins-Minerals (WOMENS 50+ MULTI VITAMIN/MIN) TABS Take 1 tablet by mouth daily.    . nitroGLYCERIN (NITROSTAT) 0.4 MG SL tablet Place 1 tablet under the tongue as needed. X 3 doses  0  . omeprazole (PRILOSEC) 20 MG capsule Take 20 mg by mouth as needed (REFLUX).   11  . oxybutynin (DITROPAN-XL) 5 MG 24 hr tablet Take 5 mg by mouth daily.  0  . potassium chloride SA (K-DUR,KLOR-CON) 20 MEQ tablet Take 20 mEq by mouth 3 (three) times daily.    . pravastatin (PRAVACHOL) 40 MG tablet Take 40 mg by mouth every morning.     . rivaroxaban (XARELTO) 20 MG TABS tablet Take 1 tablet (20 mg total) by mouth every morning. 15 tablet 0  . tiotropium (SPIRIVA) 18 MCG inhalation capsule Place 18 mcg into inhaler and inhale daily.    Marland Kitchen venlafaxine XR (EFFEXOR-XR) 150  MG 24 hr capsule Take 150 mg by mouth daily.    Marland Kitchen zolpidem (AMBIEN) 5 MG tablet Take 5 mg by mouth at bedtime.     No current facility-administered medications for this visit.    Allergies:   Review of patient's allergies indicates no active allergies.    Social History:  The patient  reports that she has never smoked. She has never used smokeless tobacco. She reports that she does not drink alcohol or use illicit drugs.   Family History:  The patient's family history includes Diabetes type II in her daughter; Heart attack in her father; Heart disease in her daughter.    ROS:  General:no colds or fevers, no weight changes Skin:no rashes or ulcers, + bruising from the xarelto HEENT:no blurred vision, no congestion CV:see HPI PUL:see HPI GI:no diarrhea constipation or melena, no indigestion GU:no hematuria, no dysuria MS:no joint pain, no claudication Neuro:no syncope, no lightheadedness Endo:no diabetes, no thyroid disease  Wt Readings from Last 3 Encounters:  03/31/16 196 lb 1.9 oz (88.959 kg)  02/29/16 194 lb 3.2 oz (88.089 kg)  02/14/16 195 lb (88.451 kg)     PHYSICAL EXAM: VS:  BP 110/62 mmHg  Pulse 60  Ht 5\' 3"  (1.6 m)  Wt 196 lb 1.9 oz (88.959 kg)  BMI 34.75 kg/m2 , BMI Body mass index is 34.75 kg/(m^2). General:Pleasant affect, NAD Skin:Warm and dry, brisk capillary refill HEENT:normocephalic, sclera clear, mucus membranes moist Neck:supple, no JVD, no bruits  Heart Irreg irreg without murmur, gallup, rub or click Lungs:clear without rales, rhonchi, or wheezes [id number], non tender, + BS, do not palpate liver spleen or masses Ext:+ 1 lower ext edema,  2+ radial pulses Neuro:alert and oriented X 3, MAE, follows commands, + facial symmetry    EKG:  EKG is NOT ordered today.   Recent Labs: 07/24/2015: BUN 17; Creat 1.07*; Potassium 4.0; Sodium 140    Lipid Panel    Component Value Date/Time   CHOL * 03/01/2007 1054    227        ATP III CLASSIFICATION:   <200     mg/dL   Desirable  200-239  mg/dL   Borderline High  >=240    mg/dL   High   TRIG 168* 03/01/2007 1054   HDL 44 03/01/2007 1054   CHOLHDL 5.2 03/01/2007 1054   VLDL 34 03/01/2007 1054   LDLCALC * 03/01/2007 1054    149        Total Cholesterol/HDL:CHD Risk Coronary Heart Disease Risk Table  Men   Women  1/2 Average Risk   3.4   3.3       Other studies Reviewed: Additional studies/ records that were reviewed today include: .  NUC study: Study Highlights     The left ventricular ejection fraction is mildly decreased (45-54%).  There was no ST segment deviation noted during stress. Baseline EKG showed atrial fibrillation with RBBB.  The study is normal.  This is a low risk study.  Nuclear stress EF: 53%.     ASSESSMENT AND PLAN:   1. Angina with DOE.Nuc study was neg.  She continues with some sharp shootin pain, improves with walking.  BP [id number] systolic so would not increase imdur now.  Have asked her to try prilosec daily to see if this will help.  She does not wihs to have cath.    2. Atrial fibrillation- permanent History of chronic A. Fib rate controlled on Xarelto oral anticoagulation. Labs were done with Dr. Wilson Singer.  3. Aortic insufficiency History of moderate aortic insufficiency demonstrated on 2-D echo 10/21/14. She had normal LV size and function.  4. Hyperlipidemia History of hyperlipidemia on pravastatin followed by her PCP  5. Pulmonary embolism History of remote pulmonary embolus Xarelto   6. SOB will check BNP today and BMP  F/u with [name] 2 mo   Current medicines are reviewed with the patient today.  The patient Has no concerns regarding medicines.  The following changes have been made:  See above Labs/ tests ordered today include:see above  Disposition:   FU:  see above  Signed, Cecilie Kicks, NP  03/31/2016 11:23 AM    [address], [place], [place], [place] Phone: [phone]; Fax: [phone]

## 2016-03-31 ENCOUNTER — Encounter: Payer: Self-pay | Admitting: Cardiology

## 2016-03-31 ENCOUNTER — Ambulatory Visit (INDEPENDENT_AMBULATORY_CARE_PROVIDER_SITE_OTHER): Payer: Medicare Other | Admitting: Cardiology

## 2016-03-31 VITALS — BP 110/62 | HR 60 | Ht 63.0 in | Wt 196.1 lb

## 2016-03-31 DIAGNOSIS — I482 Chronic atrial fibrillation, unspecified: Secondary | ICD-10-CM

## 2016-03-31 DIAGNOSIS — R079 Chest pain, unspecified: Secondary | ICD-10-CM

## 2016-03-31 DIAGNOSIS — I509 Heart failure, unspecified: Secondary | ICD-10-CM

## 2016-03-31 DIAGNOSIS — I351 Nonrheumatic aortic (valve) insufficiency: Secondary | ICD-10-CM | POA: Diagnosis not present

## 2016-03-31 DIAGNOSIS — E785 Hyperlipidemia, unspecified: Secondary | ICD-10-CM

## 2016-03-31 DIAGNOSIS — Z7901 Long term (current) use of anticoagulants: Secondary | ICD-10-CM

## 2016-03-31 DIAGNOSIS — R0602 Shortness of breath: Secondary | ICD-10-CM

## 2016-03-31 DIAGNOSIS — I10 Essential (primary) hypertension: Secondary | ICD-10-CM

## 2016-03-31 LAB — BASIC METABOLIC PANEL
BUN: 21 mg/dL (ref 7–25)
CALCIUM: 8.4 mg/dL — AB (ref 8.6–10.4)
CHLORIDE: 102 mmol/L (ref 98–110)
CO2: 28 mmol/L (ref 20–31)
Creat: 1.12 mg/dL — ABNORMAL HIGH (ref 0.60–0.88)
GLUCOSE: 133 mg/dL — AB (ref 65–99)
POTASSIUM: 3.9 mmol/L (ref 3.5–5.3)
SODIUM: 140 mmol/L (ref 135–146)

## 2016-03-31 LAB — BRAIN NATRIURETIC PEPTIDE: Brain Natriuretic Peptide: 255.8 pg/mL — ABNORMAL HIGH (ref <100)

## 2016-03-31 NOTE — Patient Instructions (Signed)
Medication Instructions:  Your physician recommends that you continue on your current medications as directed. Please refer to the Current Medication list given to you today.   Labwork: Bmet, Bnp today  Testing/Procedures: None ordered  Follow-Up: Follow up as planned with Dr.Berry in September 2017  Any Other Special Instructions Will Be Listed Below (If Applicable).     If you need a refill on your cardiac medications before your next appointment, please call your pharmacy.

## 2016-04-01 ENCOUNTER — Telehealth: Payer: Self-pay | Admitting: Cardiology

## 2016-04-01 MED ORDER — FUROSEMIDE 40 MG PO TABS: ORAL_TABLET | ORAL | Status: DC

## 2016-04-01 NOTE — Telephone Encounter (Signed)
-----   Message from Isaiah Serge, NP sent at 03/31/2016  4:44 PM EDT ----- For one day take 2 lasix to total 80 mg for 2 days to see if this helps her SOB.  Then go back to the 60 mg daily.  Thanks.

## 2016-04-01 NOTE — Telephone Encounter (Signed)
Pt's dtr returned call regarding lab results-pls call back

## 2016-04-01 NOTE — Telephone Encounter (Signed)
Pt daughter, [name], [place] on file, has been made aware of pts lab results. She will have pt take 2 lasix 40 mg X's 2 days then go back to 1 1/2 tablet daily after that. She has been advised that if it didn't help her sob, to call the office and report. She verbalized understanding.

## 2016-04-07 ENCOUNTER — Telehealth: Payer: Self-pay | Admitting: Cardiovascular Disease

## 2016-04-07 NOTE — Telephone Encounter (Signed)
Raven Gray (daughter) is calling because she has tried this medication for 30 days ( Isosorbide) she is not having the pains anymore ,and is not sure if they want her to go back on the medication . Please call    Thanks

## 2016-04-07 NOTE — Telephone Encounter (Signed)
I spoke with the pt's daughter [name] and she wanted to make sure that the pt is suppose to continue taking Isosorbide MN.  Initially the pt was given this medication for a 30 day trial and because of symptom improvement the dosage was increased to 60mg  daily.  Based on 03/31/2016 office note with Cecilie Kicks NP the pt was advised to continue current medications. I made [name] aware that the pt should continue Isosorbide MN 60mg  daily.

## 2016-04-09 ENCOUNTER — Telehealth: Payer: Self-pay | Admitting: Cardiovascular Disease

## 2016-04-09 NOTE — Telephone Encounter (Signed)
LMTCB

## 2016-04-09 NOTE — Telephone Encounter (Signed)
I spoke with the pt's daughter Raven Gray and she said the pt feels okay but the daughter feels like the pt does not seem like herself.  She said the pt was confused his morning and said that [name] (pt's daughter) spent the night with her and stayed in her room.  Raven Gray wondered if the pt's potassium was low and this could contribute to confusion.  The pt had recent BMP (sodium and potassium were normal) on 03/31/16.  I asked if the pt had complained of any symptoms of possible UTI.  She did not know of any issues but she will contact the PCP if the pt continues to have confusion.

## 2016-04-09 NOTE — Telephone Encounter (Signed)
New message     The daughter is calling asking about the potassium level cause the pt is more confused than normal. The pt had level drawn last Monday.

## 2016-04-09 NOTE — Telephone Encounter (Signed)
F/U    Pt returning nurse call.

## 2016-05-13 ENCOUNTER — Telehealth: Payer: Self-pay | Admitting: Cardiovascular Disease

## 2016-05-13 NOTE — Telephone Encounter (Signed)
New message  ° ° °Patient calling the office for samples of medication: ° ° °1.  What medication and dosage are you requesting samples for? xarelto  20 mg  ° °2.  Are you currently out of this medication? Yes  ° ° °

## 2016-05-13 NOTE — Telephone Encounter (Signed)
Patient's daughter notified that samples are available for pick up. She states her mother is NOT out of the medication - has 3 days left

## 2016-05-21 ENCOUNTER — Encounter: Payer: Self-pay | Admitting: Cardiovascular Disease

## 2016-05-21 ENCOUNTER — Telehealth: Payer: Self-pay | Admitting: Cardiovascular Disease

## 2016-05-21 NOTE — Telephone Encounter (Signed)
Closed encounter °

## 2016-06-06 ENCOUNTER — Ambulatory Visit: Payer: Self-pay | Admitting: Cardiovascular Disease

## 2016-06-25 ENCOUNTER — Encounter: Payer: Self-pay | Admitting: Cardiovascular Disease

## 2016-06-25 ENCOUNTER — Ambulatory Visit (INDEPENDENT_AMBULATORY_CARE_PROVIDER_SITE_OTHER): Payer: Medicare Other | Admitting: Cardiovascular Disease

## 2016-06-25 DIAGNOSIS — I209 Angina pectoris, unspecified: Secondary | ICD-10-CM | POA: Diagnosis not present

## 2016-06-25 DIAGNOSIS — I259 Chronic ischemic heart disease, unspecified: Secondary | ICD-10-CM

## 2016-06-25 DIAGNOSIS — E7800 Pure hypercholesterolemia, unspecified: Secondary | ICD-10-CM | POA: Diagnosis not present

## 2016-06-25 DIAGNOSIS — I351 Nonrheumatic aortic (valve) insufficiency: Secondary | ICD-10-CM | POA: Diagnosis not present

## 2016-06-25 DIAGNOSIS — Z23 Encounter for immunization: Secondary | ICD-10-CM | POA: Diagnosis not present

## 2016-06-25 DIAGNOSIS — I482 Chronic atrial fibrillation, unspecified: Secondary | ICD-10-CM

## 2016-06-25 NOTE — Progress Notes (Signed)
06/25/2016 Raven Gray   Jul 14, 1928  [id number]  Primary Physician [name], MD Primary Cardiologist: Lorretta Harp MD Renae Gloss  HPI:  Raven Gray is an 82 year old moderately overweight married Caucasian female mother of 4 daughters, grandmother to 48 grandchildren was accompanied by one of her daughters [name] today. I last saw her in the office 02/29/16.. She unfortunately lost her oldest daughter June 2016 2 diabetes.She was referred by Dr. Wilson Singer for new-onset A. Fib and chest pain. The patient has a remote history of a pulmonary embolism and DVT on Xarelto oral autoregulation. Problems include hypertension, hyperlipidemia and COPD. She does have a family history of heart disease in the father who died of an MI. She does have a daughter who's had stents. She has never had a heart attack or stroke. Over the last month she said it was a chest pain which awakened her from sleep. She also feels weak and sluggish. An EKG performed by her primary care physician revealed atrial fibrillation which was newly recognized. I performed a 2-D echo cardiogram which was normal as was a Myoview stress test. She has had several ER visits/admissions for atypical chest pain and shortness of breath. A recent 2-D echo performed 10/21/14 revealed normal LV size and function, moderate aortic insufficiency with a mildly dilated left atrium. We have discussed the possibility of outpatient cardioversion however the patient does not wish to pursue this at this time. Because of ongoing chest pain she had a Myoview stress test performed 02/22/16 which was low risk. Because of ongoing chest pain which does have ischemic discuss the possibility of performing outpatient cardiac catheterization which the patient currently does not wish to pursue. As result of that I elected to increase her Imdur from 30-60 mg a day and will follow her clinically closely as an outpatient. Since I saw her back in 3 months ago her pain  has significantly improved.   Current Outpatient Prescriptions  Medication Sig Dispense Refill  . ALPRAZolam (XANAX) 0.25 MG tablet Take 1 tablet (0.25 mg total) by mouth 2 (two) times daily as needed for anxiety. 30 tablet 0  . furosemide (LASIX) 40 MG tablet Take 2 tablets by mouth X's 2 days then go back to 1 1/2 tablet by mouth daily 90 tablet 3  . ibuprofen (ADVIL,MOTRIN) 200 MG tablet Take 600 mg by mouth every 6 (six) hours as needed for moderate pain.    . isosorbide mononitrate (IMDUR) 60 MG 24 hr tablet Take 1 tablet (60 mg total) by mouth daily. 90 tablet 3  . metoprolol (LOPRESSOR) 25 MG tablet Take 1 tablet (25 mg total) by mouth 2 (two) times daily. 180 tablet 3  . Multiple Vitamins-Minerals (WOMENS 50+ MULTI VITAMIN/MIN) TABS Take 1 tablet by mouth daily.    . nitroGLYCERIN (NITROSTAT) 0.4 MG SL tablet Place 1 tablet under the tongue as needed. X 3 doses  0  . omeprazole (PRILOSEC) 20 MG capsule Take 20 mg by mouth as needed (REFLUX).   11  . oxybutynin (DITROPAN-XL) 5 MG 24 hr tablet Take 5 mg by mouth daily.  0  . potassium chloride SA (K-DUR,KLOR-CON) 20 MEQ tablet Take 20 mEq by mouth 3 (three) times daily.    . pravastatin (PRAVACHOL) 40 MG tablet Take 40 mg by mouth every morning.     . rivaroxaban (XARELTO) 20 MG TABS tablet Take 1 tablet (20 mg total) by mouth every morning. 15 tablet 0  . tiotropium (SPIRIVA) 18  MCG inhalation capsule Place 18 mcg into inhaler and inhale daily.    Marland Kitchen venlafaxine XR (EFFEXOR-XR) 150 MG 24 hr capsule Take 150 mg by mouth daily.    Marland Kitchen zolpidem (AMBIEN) 5 MG tablet Take 5 mg by mouth at bedtime.     No current facility-administered medications for this visit.     No Active Allergies  Social History   Social History  . Marital status: Widowed    Spouse name: N/A  . Number of children: N/A  . Years of education: N/A   Occupational History  . Not on file.   Social History Main Topics  . Smoking status: Never Smoker  . Smokeless  tobacco: Never Used  . Alcohol use No  . Drug use: No  . Sexual activity: No   Other Topics Concern  . Not on file   Social History Narrative  . No narrative on file     Review of Systems: General: negative for chills, fever, night sweats or weight changes.  Cardiovascular: negative for chest pain, dyspnea on exertion, edema, orthopnea, palpitations, paroxysmal nocturnal dyspnea or shortness of breath Dermatological: negative for rash Respiratory: negative for cough or wheezing Urologic: negative for hematuria Abdominal: negative for nausea, vomiting, diarrhea, bright red blood per rectum, melena, or hematemesis Neurologic: negative for visual changes, syncope, or dizziness All other systems reviewed and are otherwise negative except as noted above.    Blood pressure 100/70, pulse 64, height 5\' 3"  (1.6 m), weight 190 lb 12.8 oz (86.5 kg), SpO2 97 %.  General appearance: alert and no distress Neck: no adenopathy, no carotid bruit, no JVD, supple, symmetrical, trachea midline and thyroid not enlarged, symmetric, no tenderness/mass/nodules Lungs: clear to auscultation bilaterally Heart: irregularly irregular rhythm Extremities: extremities normal, atraumatic, no cyanosis or edema  EKG not performed today  ASSESSMENT AND PLAN:   Pulmonary embolism History of pulmonary embolism in the past currently on Xarelto oral anticoagulation.  Atrial fibrillation History of chronic atrial fibrillation rate controlled on metoprolol. She is on Xaerelto oral at regulation.  Chest pain History of atypical chest pain the recent Myoview performed 02/22/16 which was low risk. Her pain has since significantly subsided.  Aortic insufficiency History of moderate aortic insufficiency with normal LV size and function. She is fairly asymptomatic from this.  Hyperlipidemia History of hyperlipidemia on statin therapy followed by her PCP.      Lorretta Harp MD FACP,FACC,FAHA,  [facility] 06/25/2016 10:29 AM

## 2016-06-25 NOTE — Patient Instructions (Signed)
Medication Instructions:  NO CHANGES.   Follow-Up: We request that you follow-up in: [place] and in 12 MONTHS with Dr Andria Rhein will receive a reminder letter in the mail two months in advance. If you don't receive a letter, please call our office to schedule the follow-up appointment.  You were given the flu shot today.   If you need a refill on your cardiac medications before your next appointment, please call your pharmacy.

## 2016-06-25 NOTE — Assessment & Plan Note (Signed)
History of chronic atrial fibrillation rate controlled on metoprolol. She is on Xaerelto oral at regulation.

## 2016-06-25 NOTE — Assessment & Plan Note (Signed)
History of moderate aortic insufficiency with normal LV size and function. She is fairly asymptomatic from this.

## 2016-06-25 NOTE — Assessment & Plan Note (Signed)
History of pulmonary embolism in the past currently on Xarelto oral anticoagulation.

## 2016-06-25 NOTE — Assessment & Plan Note (Signed)
History of hyperlipidemia on statin therapy followed by her PCP. 

## 2016-06-25 NOTE — Assessment & Plan Note (Signed)
History of atypical chest pain the recent Myoview performed 02/22/16 which was low risk. Her pain has since significantly subsided.

## 2016-07-16 ENCOUNTER — Ambulatory Visit (INDEPENDENT_AMBULATORY_CARE_PROVIDER_SITE_OTHER): Payer: Self-pay | Admitting: Orthopaedic Surgery

## 2016-07-24 ENCOUNTER — Telehealth: Payer: Self-pay | Admitting: Cardiovascular Disease

## 2016-07-24 NOTE — Telephone Encounter (Signed)
Pt is calling and wondering if we have any samples of the Xarelto 20mg 

## 2016-07-24 NOTE — Telephone Encounter (Signed)
Medication Samples have been left at front desk for pickup. Patient's daughter notified.  Drug name: Xarelto       Strength: 20 mg        Qty: 4 bottles(28 pills)  LOT: 16MG [id number]  Exp.Date: 09/19

## 2016-07-29 ENCOUNTER — Encounter (HOSPITAL_COMMUNITY): Payer: Self-pay | Admitting: Emergency Medicine

## 2016-07-29 ENCOUNTER — Emergency Department (HOSPITAL_COMMUNITY)
Admission: EM | Admit: 2016-07-29 | Discharge: 2016-07-29 | Disposition: A | Discharge status: Home / Self Care | Payer: Medicare Other | Attending: Emergency Medicine | Admitting: Emergency Medicine

## 2016-07-29 DIAGNOSIS — S0992XA Unspecified injury of nose, initial encounter: Secondary | ICD-10-CM | POA: Diagnosis present

## 2016-07-29 DIAGNOSIS — Z7901 Long term (current) use of anticoagulants: Secondary | ICD-10-CM | POA: Insufficient documentation

## 2016-07-29 DIAGNOSIS — S0031XA Abrasion of nose, initial encounter: Secondary | ICD-10-CM | POA: Diagnosis not present

## 2016-07-29 DIAGNOSIS — R040 Epistaxis: Secondary | ICD-10-CM | POA: Diagnosis not present

## 2016-07-29 DIAGNOSIS — Y999 Unspecified external cause status: Secondary | ICD-10-CM | POA: Insufficient documentation

## 2016-07-29 DIAGNOSIS — X58XXXA Exposure to other specified factors, initial encounter: Secondary | ICD-10-CM | POA: Diagnosis not present

## 2016-07-29 DIAGNOSIS — I509 Heart failure, unspecified: Secondary | ICD-10-CM | POA: Insufficient documentation

## 2016-07-29 DIAGNOSIS — I110 Hypertensive heart disease with heart failure: Secondary | ICD-10-CM | POA: Insufficient documentation

## 2016-07-29 DIAGNOSIS — J449 Chronic obstructive pulmonary disease, unspecified: Secondary | ICD-10-CM | POA: Diagnosis not present

## 2016-07-29 DIAGNOSIS — R58 Hemorrhage, not elsewhere classified: Secondary | ICD-10-CM | POA: Diagnosis not present

## 2016-07-29 DIAGNOSIS — Y929 Unspecified place or not applicable: Secondary | ICD-10-CM | POA: Diagnosis not present

## 2016-07-29 DIAGNOSIS — Y939 Activity, unspecified: Secondary | ICD-10-CM | POA: Insufficient documentation

## 2016-07-29 MED ORDER — OXYMETAZOLINE HCL 0.05 % NA SOLN
1.0000 | Freq: Once | NASAL | Status: AC
  Administered 2016-07-29: 1 via NASAL
  Filled 2016-07-29: qty 15

## 2016-07-29 MED ORDER — GELATIN ABSORBABLE 12-7 MM EX MISC
1.0000 | Freq: Once | CUTANEOUS | Status: AC
  Administered 2016-07-29: 1 via TOPICAL
  Filled 2016-07-29: qty 1

## 2016-07-29 NOTE — ED Provider Notes (Signed)
[facility] DEPT Provider Note   CSN: [id number] Arrival date & time: 07/29/16  1944     History   Chief Complaint Chief Complaint  Patient presents with  . Epistaxis    HPI Raven Gray is a 82 y.o. female with hx of a. Fib on xarelto Presents to the ED noting the onset of right-sided epistaxis this evening around 6 PM. She states that she also had a history of a mild nosebleed last night which stopped on its own. This evening, while at rest her epistaxis started again. No hx of falls, or direct trauma to the area. No hx of prior nosebleeds. She states that she felt slightly lightheaded initially when her bleeding started, but states that this was brief and since had no symptoms of light-headedness, chest pain, tightness, SOB, nausea, vomiting, diaphoresis.   HPI  Past Medical History:  Diagnosis Date  . Aortic insufficiency    moderate by recent 2-D echocardiogram  . Atrial fibrillation ([place])   . Chest pain   . COPD (chronic obstructive pulmonary disease) ([place])   . History of depression   . History of DVT (deep vein thrombosis)   . History of pulmonary embolism   . Hyperlipidemia   . Hypertension   . Hypokalemia    Now improved with treatment  . Shortness of breath     Patient Active Problem List   Diagnosis Date Noted  . Hyperlipidemia 10/23/2015  . Aortic insufficiency 12/15/2014  . Edema of both legs 09/19/2014  . Atrial fibrillation ([place]) 11/21/2013  . Chest pain 11/21/2013  . Community acquired pneumonia 06/19/2012  . Pulmonary embolism ([place]) 06/19/2012  . Hypokalemia 06/18/2012  . COPD with acute exacerbation ([place]) 06/18/2012  . CHF exacerbation ([place]) 06/18/2012  . Hypoxia 06/18/2012    Past Surgical History:  Procedure Laterality Date  . REPLACEMENT TOTAL KNEE    . ROTATOR CUFF REPAIR      OB History    No data available       Home Medications    Prior to Admission medications   Medication Sig Start Date End Date Taking? Authorizing  Provider  ALPRAZolam (XANAX) 0.25 MG tablet Take 1 tablet (0.25 mg total) by mouth 2 (two) times daily as needed for anxiety. 10/21/14  Yes Maryann Mikhail, DO  furosemide (LASIX) 40 MG tablet Take 2 tablets by mouth X's 2 days then go back to 1 1/2 tablet by mouth daily Patient taking differently: Take 50 mg by mouth every morning.  04/01/16  Yes Isaiah Serge, NP  ibuprofen (ADVIL,MOTRIN) 200 MG tablet Take 400-600 mg by mouth every 6 (six) hours as needed (for back pain).    Yes Historical Provider, MD  isosorbide mononitrate (IMDUR) 60 MG 24 hr tablet Take 1 tablet (60 mg total) by mouth daily. 02/29/16  Yes Lorretta Harp, MD  metoprolol (LOPRESSOR) 25 MG tablet Take 1 tablet (25 mg total) by mouth 2 (two) times daily. 06/20/14  Yes Lorretta Harp, MD  Multiple Vitamins-Minerals (WOMENS 50+ [place] VITAMIN/MIN) TABS Take 1 tablet by mouth daily.   Yes Historical Provider, MD  nitroGLYCERIN (NITROSTAT) 0.4 MG SL tablet Place 1 tablet under the tongue as needed for chest pain. X 3 doses 02/08/16  Yes Historical Provider, MD  omeprazole (PRILOSEC) 20 MG capsule Take 20 mg by mouth daily as needed (for reflux/heartburn).  04/23/15  Yes Historical Provider, MD  oxybutynin (DITROPAN-XL) 5 MG 24 hr tablet Take 5 mg by mouth daily. 07/02/15  Yes Historical  Provider, MD  potassium chloride SA (K-DUR,KLOR-CON) 20 MEQ tablet Take 60 mEq by mouth every morning.    Yes Historical Provider, MD  pravastatin (PRAVACHOL) 40 MG tablet Take 40 mg by mouth every morning.    Yes Historical Provider, MD  rivaroxaban (XARELTO) 20 MG TABS tablet Take 1 tablet (20 mg total) by mouth every morning. 09/28/15  Yes Lorretta Harp, MD  tiotropium (SPIRIVA) 18 MCG inhalation capsule Place 18 mcg into inhaler and inhale daily as needed (for symptoms).    Yes Historical Provider, MD  venlafaxine XR (EFFEXOR-XR) 150 MG 24 hr capsule Take 150 mg by mouth daily.   Yes Historical Provider, MD  zolpidem (AMBIEN) 5 MG tablet Take 5 mg by  mouth at bedtime. 11/03/13  Yes Historical Provider, MD    Family History Family History  Problem Relation Age of Onset  . Heart attack Father   . Diabetes type II Daughter   . Heart disease Daughter     stents    Social History Social History  Substance Use Topics  . Smoking status: Never Smoker  . Smokeless tobacco: Never Used  . Alcohol use No     Allergies   Patient has no known allergies.   Review of Systems Review of Systems  Constitutional: Negative for activity change, appetite change, chills and fever.  HENT: Positive for nosebleeds. Negative for rhinorrhea, sinus pressure, sneezing and sore throat.   Respiratory: Negative for cough, chest tightness and shortness of breath.   Cardiovascular: Negative for chest pain.  Gastrointestinal: Negative for abdominal pain, blood in stool, nausea and vomiting.  Genitourinary: Negative for hematuria.  Musculoskeletal: Negative for back pain and neck pain.  Skin: Negative for wound.  Neurological: Negative for dizziness, syncope, weakness, light-headedness, numbness and headaches.  Psychiatric/Behavioral: Negative for confusion.  All other systems reviewed and are negative.    Physical Exam Updated Vital Signs BP 136/80   Pulse 74   Temp 97.6 F (36.4 C) (Oral)   Resp 16   Ht 5\' 3"  (1.6 m)   Wt 83.9 kg   SpO2 96%   BMI 32.77 kg/m   Physical Exam  Constitutional: She is oriented to person, place, and time. She appears well-developed and well-nourished.  HENT:  Head: Normocephalic and atraumatic.  Nose: No nose lacerations, sinus tenderness, nasal deformity or nasal septal hematoma.  Mouth/Throat: Oropharynx is clear and moist.  Small abrasion on the medial wall of the nostril with no active bleeding noted. Left nostril normal.  Eyes: Conjunctivae and EOM are normal. Pupils are equal, round, and reactive to light.  Neck: Normal range of motion. Neck supple.  Cardiovascular: Normal rate, regular rhythm, normal  heart sounds and intact distal pulses.   Pulmonary/Chest: Effort normal and breath sounds normal.  Abdominal: Soft. There is no tenderness.  Neurological: She is alert and oriented to person, place, and time. No cranial nerve deficit or sensory deficit. Coordination normal.  Skin: Skin is warm and dry. No rash noted.  Nursing note and vitals reviewed.    ED Treatments / Results  Labs (all labs ordered are listed, but only abnormal results are displayed) Labs Reviewed - No data to display  EKG  EKG Interpretation None       Radiology No results found.  Procedures Procedures (including critical care time)  Medications Ordered in ED Medications  gelatin adsorbable (GELFOAM/SURGIFOAM) sponge 12-7 mm 1 each (1 each Topical Given 07/29/16 2022)  oxymetazoline (AFRIN) 0.05 % nasal spray 1 spray (1  spray Each Nare Given 07/29/16 2022)     Initial Impression / Assessment and Plan / ED Course  I have reviewed the triage vital signs and the nursing notes.  Pertinent labs & imaging results that were available during my care of the patient were reviewed by me and considered in my medical decision making (see chart for details).  Clinical Course    82 year old female on xarelto  presents with epistaxis. Initial exam as above with no active bleeding noted. Small area of raw nasal mucosa noted in the mid right nostril, likely the source of her bleeding. In an effort to prevent further bleeding she was given more afrin and her nostril was packed with surgifoam. She was observed in the ED after this and was found to have no further bleeding. She was recommended to leave this packing in place for the next 2 days and use further afrin if her bleeding returns and to use manual pressure. Return precautions were given for worsening or concerns.     Final Clinical Impressions(s) / ED Diagnoses   Final diagnoses:  Epistaxis    New Prescriptions Discharge Medication List as of 07/29/2016  9:16  PM       Zenovia Jarred, DO 07/29/16 [id number]    Varney Biles, MD 07/30/16 1550

## 2016-07-29 NOTE — ED Triage Notes (Signed)
Per EMS, pt reports a nose bleed that started around 6pm. EMS found multiple rags soaked with blood, estimated 500cc blood loss. Initial BP 97/45 and received 155mls fluids. 2 squirts of Affrin given. Pt denies pain. Pt reports that she had a nose bleed during the night last night as well but was not as bad as this one. Pt denies feeling lightheaded or any injury to nose.

## 2016-07-30 ENCOUNTER — Emergency Department (HOSPITAL_COMMUNITY)
Admission: EM | Admit: 2016-07-30 | Discharge: 2016-07-30 | Disposition: A | Discharge status: Home / Self Care | Payer: Medicare Other | Attending: Emergency Medicine | Admitting: Emergency Medicine

## 2016-07-30 ENCOUNTER — Encounter (HOSPITAL_COMMUNITY): Payer: Self-pay | Admitting: Emergency Medicine

## 2016-07-30 ENCOUNTER — Telehealth: Payer: Self-pay | Admitting: Cardiovascular Disease

## 2016-07-30 DIAGNOSIS — I110 Hypertensive heart disease with heart failure: Secondary | ICD-10-CM | POA: Insufficient documentation

## 2016-07-30 DIAGNOSIS — Z79899 Other long term (current) drug therapy: Secondary | ICD-10-CM | POA: Diagnosis not present

## 2016-07-30 DIAGNOSIS — J449 Chronic obstructive pulmonary disease, unspecified: Secondary | ICD-10-CM | POA: Insufficient documentation

## 2016-07-30 DIAGNOSIS — I509 Heart failure, unspecified: Secondary | ICD-10-CM | POA: Insufficient documentation

## 2016-07-30 DIAGNOSIS — Z96659 Presence of unspecified artificial knee joint: Secondary | ICD-10-CM | POA: Diagnosis not present

## 2016-07-30 DIAGNOSIS — R040 Epistaxis: Secondary | ICD-10-CM | POA: Diagnosis not present

## 2016-07-30 DIAGNOSIS — R031 Nonspecific low blood-pressure reading: Secondary | ICD-10-CM | POA: Diagnosis not present

## 2016-07-30 DIAGNOSIS — Z7901 Long term (current) use of anticoagulants: Secondary | ICD-10-CM | POA: Diagnosis not present

## 2016-07-30 NOTE — ED Notes (Signed)
Pt nose no longer bleeding. MD Pfeiffer made aware. Given permission to allow patient to have po fluids.

## 2016-07-30 NOTE — ED Triage Notes (Signed)
Per [facility]  Pt was seen here last night for a nose bleed. Pt is hypotensive. 90 Palpation. Hx of Afib and same on the monitor. Slow nose bleed. 400-500 ccs of blood at the house.   20 LAC

## 2016-07-30 NOTE — Telephone Encounter (Signed)
[name](daugther) is calling because Raven Gray had a nose bleed on last night and this morning and went to ER both times because it would not stop and the ED Doctor told them to find out if she can come off of the Xarelto for a couple of days . Please Call   Thanks

## 2016-07-30 NOTE — ED Notes (Signed)
Pt wheeled to waiting room to be transported home by family. Nasal packing in place. No bleeding at this time. VSS. A/o x4. Verbalizes understanding of discharge instructions and follow up information.

## 2016-07-30 NOTE — ED Triage Notes (Signed)
Pt to ER BIB  [facility] from home for recurrent epistaxis. Pt seen in ER last night with surgifoam placement and cessation of bleeding, was d/c home. On awakening this morning patient noticed bleeding reoccurring, EMS was called. Reports 400-500 cc blood loss with hypotension in the 90's, was given 500 cc NS in route. On arrival no longer hypotensive but nose continuing to bleed. Pfeiffer at bedside to place packing. Pt a/o x4.

## 2016-07-30 NOTE — Telephone Encounter (Signed)
Spoke w daughter. She voices understanding of instructions. Will relay to patient. Notes patient has appt w ENT next Monday 11/13. Advised to follow ENT advice on use of Xarelto, but to call us back if they need to defer to cardiology for these instructions or if med hold would be delayed for longer than several days. She voiced understanding and thanks.

## 2016-07-30 NOTE — ED Provider Notes (Signed)
[facility] DEPT Provider Note   CSN: [id number] Arrival date & time: 07/30/16  0700     History   Chief Complaint Chief Complaint  Patient presents with  . Epistaxis    HPI Raven Gray is a 82 y.o. female.  HPI Patient seen yesterday for nosebleed. Right Nostril started bleeding at 6 PM yesterday. She was treated with Surgifoam with no active bleeding while in the emergency department. She reports overnight she did have recurrence of bleeding. This morning she reports it was dripping quite briskly. She has also had clot in the back of her throat. Patient is on a Xarelto.  Patient Active Problem List   Diagnosis Date Noted  . Hyperlipidemia 10/23/2015  . Aortic insufficiency 12/15/2014  . Edema of both legs 09/19/2014  . Atrial fibrillation ([place]) 11/21/2013  . Chest pain 11/21/2013  . Community acquired pneumonia 06/19/2012  . Pulmonary embolism ([place]) 06/19/2012  . Hypokalemia 06/18/2012  . COPD with acute exacerbation ([place]) 06/18/2012  . CHF exacerbation ([place]) 06/18/2012  . Hypoxia 06/18/2012    Past Surgical History:  Procedure Laterality Date  . REPLACEMENT TOTAL KNEE    . ROTATOR CUFF REPAIR      OB History    No data available       Home Medications    Prior to Admission medications   Medication Sig Start Date End Date Taking? Authorizing Provider  ALPRAZolam (XANAX) 0.25 MG tablet Take 1 tablet (0.25 mg total) by mouth 2 (two) times daily as needed for anxiety. 10/21/14   Maryann Mikhail, DO  furosemide (LASIX) 40 MG tablet Take 2 tablets by mouth X's 2 days then go back to 1 1/2 tablet by mouth daily Patient taking differently: Take 50 mg by mouth every morning.  04/01/16   Isaiah Serge, NP  ibuprofen (ADVIL,MOTRIN) 200 MG tablet Take 400-600 mg by mouth every 6 (six) hours as needed (for back pain).     Historical Provider, MD  isosorbide mononitrate (IMDUR) 60 MG 24 hr tablet Take 1 tablet (60 mg total) by mouth daily. 02/29/16   Lorretta Harp, MD    metoprolol (LOPRESSOR) 25 MG tablet Take 1 tablet (25 mg total) by mouth 2 (two) times daily. 06/20/14   Lorretta Harp, MD  Multiple Vitamins-Minerals (WOMENS 50+ [place] VITAMIN/MIN) TABS Take 1 tablet by mouth daily.    Historical Provider, MD  nitroGLYCERIN (NITROSTAT) 0.4 MG SL tablet Place 1 tablet under the tongue as needed for chest pain. X 3 doses 02/08/16   Historical Provider, MD  omeprazole (PRILOSEC) 20 MG capsule Take 20 mg by mouth daily as needed (for reflux/heartburn).  04/23/15   Historical Provider, MD  oxybutynin (DITROPAN-XL) 5 MG 24 hr tablet Take 5 mg by mouth daily. 07/02/15   Historical Provider, MD  potassium chloride SA (K-DUR,KLOR-CON) 20 MEQ tablet Take 60 mEq by mouth every morning.     Historical Provider, MD  pravastatin (PRAVACHOL) 40 MG tablet Take 40 mg by mouth every morning.     Historical Provider, MD  rivaroxaban (XARELTO) 20 MG TABS tablet Take 1 tablet (20 mg total) by mouth every morning. 09/28/15   Lorretta Harp, MD  tiotropium (SPIRIVA) 18 MCG inhalation capsule Place 18 mcg into inhaler and inhale daily as needed (for symptoms).     Historical Provider, MD  venlafaxine XR (EFFEXOR-XR) 150 MG 24 hr capsule Take 150 mg by mouth daily.    Historical Provider, MD  zolpidem (AMBIEN) 5 MG tablet Take 5  mg by mouth at bedtime. 11/03/13   Historical Provider, MD    Family History Family History  Problem Relation Age of Onset  . Heart attack Father   . Diabetes type II Daughter   . Heart disease Daughter     stents    Social History Social History  Substance Use Topics  . Smoking status: Never Smoker  . Smokeless tobacco: Never Used  . Alcohol use No     Allergies   Patient has no known allergies.   Review of Systems Review of Systems Constitutional: No recent fevers or chills   ENT: No recent sore throat, nasal congestion or sinus pressure [place] vascular: No chest pain or syncope, no lightheadedness Respiratory: Patient reports she  chronically has some shortness of breath. No worse than baseline.  Physical Exam Updated Vital Signs BP 141/92   Pulse 79   Temp 97.6 F (36.4 C) (Oral)   Resp 16   SpO2 98%   Physical Exam  Constitutional: She is oriented to person, place, and time. She appears well-developed and well-nourished.  Mildly obese [age] female who is nontoxic and alert. No respiratory distress at rest. Color is good.  HENT:  Head: Normocephalic and atraumatic.  Trickle of red blood from the right near. Clot is visible in the posterior oropharynx.  Eyes: EOM are normal. Pupils are equal, round, and reactive to light.  Neck: Neck supple.  Cardiovascular: Normal rate, regular rhythm, normal heart sounds and intact distal pulses.   Pulmonary/Chest: Effort normal.  Patient has occasional radial in the midlung fields. Good air flow without respiratory distress.  Neurological: She is alert and oriented to person, place, and time. No cranial nerve deficit. She exhibits normal muscle tone. Coordination normal.  Skin: Skin is warm and dry. No pallor.  Psychiatric: She has a normal mood and affect.     ED Treatments / Results  Labs (all labs ordered are listed, but only abnormal results are displayed) Labs Reviewed - No data to display  EKG  EKG Interpretation None       Radiology No results found.  Procedures .Epistaxis Management Date/Time: 07/30/2016 8:13 AM Performed by: Charlesetta Shanks Authorized by: Charlesetta Shanks   Consent:    Consent obtained:  Verbal   Consent given by:  Patient Procedure details:    Treatment site:  R anterior   Treatment method:  Anterior pack   Treatment complexity:  Extensive   Treatment episode: initial   Post-procedure details:    Assessment:  Bleeding stopped   Patient tolerance of procedure:  Tolerated well, no immediate complications Comments:     Clots cleared by blowing and installation of Neo-Synephrine solution. Suction also used. Patient had clear  passage of air through the right nostril. 4 cm Merocel inserted without difficulty. Recheck after 30 minutes, no bleeding.   (including critical care time)  Medications Ordered in ED Medications - No data to display   Initial Impression / Assessment and Plan / ED Course  I have reviewed the triage vital signs and the nursing notes.  Pertinent labs & imaging results that were available during my care of the patient were reviewed by me and considered in my medical decision making (see chart for details).  Clinical Course     Final Clinical Impressions(s) / ED Diagnoses   Final diagnoses:  Right-sided epistaxis  Patient had recurrent epistaxis after treatment with Gelfoam yesterday evening. Merocel sponge packing placed without evidence of bleeding. Patient is counseled on management of  nosebleeds and follow-up plan.   New Prescriptions New Prescriptions   No medications on file     Charlesetta Shanks, MD 07/30/16 [phone]

## 2016-07-30 NOTE — Telephone Encounter (Signed)
Can hold several days and then restart.

## 2016-07-30 NOTE — Discharge Instructions (Signed)
Your nasal packing needs to be removed in 2 days. Your family doctor may do this or you may be seen by the ear nose throat doctor listed above. Call to schedule an appointment.

## 2016-07-30 NOTE — Telephone Encounter (Signed)
See request to stop/hold Xarelto. She had visit to ED last night after approx 2 hrs of unresolved epistaxis. Repeat occurrence this AM.  Please advise on med hold or whether dose adjustment warranted.

## 2016-08-04 DIAGNOSIS — R040 Epistaxis: Secondary | ICD-10-CM | POA: Diagnosis not present

## 2016-08-04 DIAGNOSIS — J342 Deviated nasal septum: Secondary | ICD-10-CM | POA: Diagnosis not present

## 2016-08-05 ENCOUNTER — Telehealth: Payer: Self-pay | Admitting: Cardiovascular Disease

## 2016-08-05 NOTE — Telephone Encounter (Signed)
New message    Raven Gray calling went to PCP for nose bleeds.     Pt c/o medication issue:  1. Name of Medication: rivaroxaban (XARELTO) 20 MG TABS tablet  2. How are you currently taking this medication (dosage and times per day)? Once a day   3. Are you having a reaction (difficulty breathing--STAT)? No   4. What is your medication issue?off medication for a week - when does patient need to resume medication or does medication need to be decrease.

## 2016-08-05 NOTE — Telephone Encounter (Signed)
Returned call to patient's daughter [name].She stated mother had 3 nose bleeds last week.Stated she took mother to ER twice.Stated she had packing removed today and wanted to make sure ok to restart Xarelto 20 mg.and not a reduced dose.Advised ok to restart Xarelto 20 mg daily.Message sent to [facility] for review.

## 2016-08-05 NOTE — Telephone Encounter (Signed)
I concur

## 2016-09-02 DIAGNOSIS — E789 Disorder of lipoprotein metabolism, unspecified: Secondary | ICD-10-CM | POA: Diagnosis not present

## 2016-09-02 DIAGNOSIS — I10 Essential (primary) hypertension: Secondary | ICD-10-CM | POA: Diagnosis not present

## 2016-09-06 ENCOUNTER — Observation Stay (HOSPITAL_COMMUNITY): Payer: Medicare Other

## 2016-09-06 ENCOUNTER — Emergency Department (HOSPITAL_COMMUNITY): Payer: Medicare Other

## 2016-09-06 ENCOUNTER — Observation Stay (HOSPITAL_COMMUNITY)
Admission: EM | Admit: 2016-09-06 | Discharge: 2016-09-07 | Disposition: A | Discharge status: Home / Self Care | Payer: Medicare Other | Attending: Internal Medicine | Admitting: Internal Medicine

## 2016-09-06 ENCOUNTER — Encounter (HOSPITAL_COMMUNITY): Payer: Self-pay | Admitting: Emergency Medicine

## 2016-09-06 DIAGNOSIS — J449 Chronic obstructive pulmonary disease, unspecified: Secondary | ICD-10-CM | POA: Diagnosis not present

## 2016-09-06 DIAGNOSIS — G459 Transient cerebral ischemic attack, unspecified: Secondary | ICD-10-CM | POA: Diagnosis not present

## 2016-09-06 DIAGNOSIS — I482 Chronic atrial fibrillation, unspecified: Secondary | ICD-10-CM

## 2016-09-06 DIAGNOSIS — I4891 Unspecified atrial fibrillation: Secondary | ICD-10-CM | POA: Insufficient documentation

## 2016-09-06 DIAGNOSIS — E669 Obesity, unspecified: Secondary | ICD-10-CM | POA: Insufficient documentation

## 2016-09-06 DIAGNOSIS — R4781 Slurred speech: Secondary | ICD-10-CM | POA: Diagnosis not present

## 2016-09-06 DIAGNOSIS — I6789 Other cerebrovascular disease: Secondary | ICD-10-CM | POA: Diagnosis not present

## 2016-09-06 DIAGNOSIS — Z86711 Personal history of pulmonary embolism: Secondary | ICD-10-CM | POA: Diagnosis not present

## 2016-09-06 DIAGNOSIS — I10 Essential (primary) hypertension: Secondary | ICD-10-CM | POA: Diagnosis not present

## 2016-09-06 DIAGNOSIS — Z79899 Other long term (current) drug therapy: Secondary | ICD-10-CM | POA: Insufficient documentation

## 2016-09-06 DIAGNOSIS — R531 Weakness: Secondary | ICD-10-CM | POA: Diagnosis not present

## 2016-09-06 DIAGNOSIS — G450 Vertebro-basilar artery syndrome: Secondary | ICD-10-CM | POA: Diagnosis not present

## 2016-09-06 DIAGNOSIS — F32A Depression, unspecified: Secondary | ICD-10-CM

## 2016-09-06 DIAGNOSIS — F419 Anxiety disorder, unspecified: Secondary | ICD-10-CM | POA: Insufficient documentation

## 2016-09-06 DIAGNOSIS — E785 Hyperlipidemia, unspecified: Secondary | ICD-10-CM | POA: Diagnosis present

## 2016-09-06 DIAGNOSIS — F329 Major depressive disorder, single episode, unspecified: Secondary | ICD-10-CM | POA: Insufficient documentation

## 2016-09-06 DIAGNOSIS — K219 Gastro-esophageal reflux disease without esophagitis: Secondary | ICD-10-CM | POA: Diagnosis not present

## 2016-09-06 DIAGNOSIS — Z7901 Long term (current) use of anticoagulants: Secondary | ICD-10-CM | POA: Insufficient documentation

## 2016-09-06 DIAGNOSIS — Z6833 Body mass index (BMI) 33.0-33.9, adult: Secondary | ICD-10-CM | POA: Insufficient documentation

## 2016-09-06 DIAGNOSIS — E7800 Pure hypercholesterolemia, unspecified: Secondary | ICD-10-CM

## 2016-09-06 DIAGNOSIS — H538 Other visual disturbances: Secondary | ICD-10-CM | POA: Insufficient documentation

## 2016-09-06 DIAGNOSIS — G458 Other transient cerebral ischemic attacks and related syndromes: Secondary | ICD-10-CM

## 2016-09-06 DIAGNOSIS — I351 Nonrheumatic aortic (valve) insufficiency: Secondary | ICD-10-CM | POA: Insufficient documentation

## 2016-09-06 DIAGNOSIS — R29818 Other symptoms and signs involving the nervous system: Secondary | ICD-10-CM | POA: Diagnosis not present

## 2016-09-06 DIAGNOSIS — Z86718 Personal history of other venous thrombosis and embolism: Secondary | ICD-10-CM | POA: Diagnosis not present

## 2016-09-06 LAB — I-STAT CHEM 8, ED
BUN: 23 mg/dL — AB (ref 6–20)
CREATININE: 1 mg/dL (ref 0.44–1.00)
Calcium, Ion: 1.19 mmol/L (ref 1.15–1.40)
Chloride: 102 mmol/L (ref 101–111)
GLUCOSE: 67 mg/dL (ref 65–99)
HEMATOCRIT: 37 % (ref 36.0–46.0)
Hemoglobin: 12.6 g/dL (ref 12.0–15.0)
POTASSIUM: 4 mmol/L (ref 3.5–5.1)
Sodium: 142 mmol/L (ref 135–145)
TCO2: 29 mmol/L (ref 0–100)

## 2016-09-06 LAB — COMPREHENSIVE METABOLIC PANEL
ALK PHOS: 89 U/L (ref 38–126)
ALT: 11 U/L — AB (ref 14–54)
AST: 20 U/L (ref 15–41)
Albumin: 3.6 g/dL (ref 3.5–5.0)
Anion gap: 5 (ref 5–15)
BUN: 19 mg/dL (ref 6–20)
CALCIUM: 9 mg/dL (ref 8.9–10.3)
CHLORIDE: 107 mmol/L (ref 101–111)
CO2: 29 mmol/L (ref 22–32)
CREATININE: 1.09 mg/dL — AB (ref 0.44–1.00)
GFR, EST AFRICAN AMERICAN: 52 mL/min — AB (ref >60)
GFR, EST NON AFRICAN AMERICAN: 45 mL/min — AB (ref >60)
Glucose, Bld: 71 mg/dL (ref 65–99)
Potassium: 4 mmol/L (ref 3.5–5.1)
Sodium: 141 mmol/L (ref 135–145)
Total Bilirubin: 0.8 mg/dL (ref 0.3–1.2)
Total Protein: 6.5 g/dL (ref 6.5–8.1)

## 2016-09-06 LAB — DIFFERENTIAL
BASOS ABS: 0.1 10*3/uL (ref 0.0–0.1)
BASOS PCT: 1 %
Eosinophils Absolute: 0.2 10*3/uL (ref 0.0–0.7)
Eosinophils Relative: 3 %
LYMPHS ABS: 1.9 10*3/uL (ref 0.7–4.0)
LYMPHS PCT: 28 %
MONO ABS: 0.6 10*3/uL (ref 0.1–1.0)
MONOS PCT: 10 %
NEUTROS ABS: 3.9 10*3/uL (ref 1.7–7.7)
Neutrophils Relative %: 58 %

## 2016-09-06 LAB — CBC
HEMATOCRIT: 38 % (ref 36.0–46.0)
HEMOGLOBIN: 11.8 g/dL — AB (ref 12.0–15.0)
MCH: 26.9 pg (ref 26.0–34.0)
MCHC: 31.1 g/dL (ref 30.0–36.0)
MCV: 86.8 fL (ref 78.0–100.0)
Platelets: 253 10*3/uL (ref 150–400)
RBC: 4.38 MIL/uL (ref 3.87–5.11)
RDW: 14.4 % (ref 11.5–15.5)
WBC: 6.8 10*3/uL (ref 4.0–10.5)

## 2016-09-06 LAB — CBG MONITORING, ED: Glucose-Capillary: 99 mg/dL (ref 65–99)

## 2016-09-06 LAB — I-STAT TROPONIN, ED: TROPONIN I, POC: 0.05 ng/mL (ref 0.00–0.08)

## 2016-09-06 LAB — PROTIME-INR
INR: 2.06
Prothrombin Time: 23.5 seconds — ABNORMAL HIGH (ref 11.4–15.2)

## 2016-09-06 LAB — APTT: aPTT: 41 seconds — ABNORMAL HIGH (ref 24–36)

## 2016-09-06 MED ORDER — PANTOPRAZOLE SODIUM 40 MG PO TBEC
40.0000 mg | DELAYED_RELEASE_TABLET | Freq: Every day | ORAL | Status: DC
  Administered 2016-09-07: 40 mg via ORAL
  Filled 2016-09-06: qty 1

## 2016-09-06 MED ORDER — ZOLPIDEM TARTRATE 5 MG PO TABS
5.0000 mg | ORAL_TABLET | Freq: Every day | ORAL | Status: DC
  Administered 2016-09-06: 5 mg via ORAL
  Filled 2016-09-06: qty 1

## 2016-09-06 MED ORDER — RIVAROXABAN 20 MG PO TABS
20.0000 mg | ORAL_TABLET | Freq: Every morning | ORAL | Status: DC
  Administered 2016-09-07: 20 mg via ORAL
  Filled 2016-09-06: qty 1

## 2016-09-06 MED ORDER — PRAVASTATIN SODIUM 40 MG PO TABS
40.0000 mg | ORAL_TABLET | Freq: Every morning | ORAL | Status: DC
  Administered 2016-09-07: 40 mg via ORAL
  Filled 2016-09-06: qty 1

## 2016-09-06 MED ORDER — IOPAMIDOL (ISOVUE-370) INJECTION 76%
INTRAVENOUS | Status: AC
  Filled 2016-09-06: qty 50

## 2016-09-06 MED ORDER — STROKE: EARLY STAGES OF RECOVERY BOOK
Freq: Once | Status: AC
  Administered 2016-09-06: 17:00:00
  Filled 2016-09-06: qty 1

## 2016-09-06 MED ORDER — TIOTROPIUM BROMIDE MONOHYDRATE 18 MCG IN CAPS: 18.0000 ug | ORAL_CAPSULE | Freq: Every day | RESPIRATORY_TRACT | Status: DC | PRN

## 2016-09-06 MED ORDER — ALPRAZOLAM 0.25 MG PO TABS
0.2500 mg | ORAL_TABLET | Freq: Two times a day (BID) | ORAL | Status: DC | PRN
  Administered 2016-09-06: 0.25 mg via ORAL
  Filled 2016-09-06: qty 1

## 2016-09-06 MED ORDER — SODIUM CHLORIDE 0.9 % IV SOLN: 250.0000 mL | INTRAVENOUS | Status: DC | PRN

## 2016-09-06 MED ORDER — ACETAMINOPHEN 325 MG PO TABS
650.0000 mg | ORAL_TABLET | ORAL | Status: DC | PRN
  Administered 2016-09-07: 650 mg via ORAL
  Filled 2016-09-06: qty 2

## 2016-09-06 MED ORDER — ACETAMINOPHEN 650 MG RE SUPP: 650.0000 mg | RECTAL | Status: DC | PRN

## 2016-09-06 MED ORDER — ACETAMINOPHEN 160 MG/5ML PO SOLN: 650.0000 mg | ORAL | Status: DC | PRN

## 2016-09-06 MED ORDER — SENNOSIDES-DOCUSATE SODIUM 8.6-50 MG PO TABS: 1.0000 | ORAL_TABLET | Freq: Every evening | ORAL | Status: DC | PRN

## 2016-09-06 MED ORDER — ISOSORBIDE MONONITRATE ER 30 MG PO TB24
60.0000 mg | ORAL_TABLET | Freq: Every day | ORAL | Status: DC
  Administered 2016-09-07: 60 mg via ORAL
  Filled 2016-09-06: qty 2

## 2016-09-06 MED ORDER — NITROGLYCERIN 0.4 MG SL SUBL: 0.4000 mg | SUBLINGUAL_TABLET | SUBLINGUAL | Status: DC | PRN

## 2016-09-06 MED ORDER — ASPIRIN 325 MG PO TABS
325.0000 mg | ORAL_TABLET | Freq: Every day | ORAL | Status: DC
  Administered 2016-09-06: 325 mg via ORAL
  Filled 2016-09-06: qty 1

## 2016-09-06 MED ORDER — VENLAFAXINE HCL ER 150 MG PO CP24
150.0000 mg | ORAL_CAPSULE | Freq: Every day | ORAL | Status: DC
  Administered 2016-09-07: 150 mg via ORAL
  Filled 2016-09-06: qty 2
  Filled 2016-09-06: qty 1

## 2016-09-06 MED ORDER — ASPIRIN 300 MG RE SUPP: 300.0000 mg | Freq: Every day | RECTAL | Status: DC

## 2016-09-06 MED ORDER — METOPROLOL TARTRATE 25 MG PO TABS
25.0000 mg | ORAL_TABLET | Freq: Two times a day (BID) | ORAL | Status: DC
  Administered 2016-09-06 – 2016-09-07 (×2): 25 mg via ORAL
  Filled 2016-09-06 (×2): qty 1

## 2016-09-06 MED ORDER — SODIUM CHLORIDE 0.9% FLUSH
3.0000 mL | Freq: Two times a day (BID) | INTRAVENOUS | Status: DC
  Administered 2016-09-06 – 2016-09-07 (×2): 3 mL via INTRAVENOUS

## 2016-09-06 MED ORDER — SODIUM CHLORIDE 0.9% FLUSH: 3.0000 mL | INTRAVENOUS | Status: DC | PRN

## 2016-09-06 MED ORDER — OXYBUTYNIN CHLORIDE ER 5 MG PO TB24
5.0000 mg | ORAL_TABLET | Freq: Every day | ORAL | Status: DC
  Administered 2016-09-07: 5 mg via ORAL
  Filled 2016-09-06: qty 1

## 2016-09-06 NOTE — Progress Notes (Signed)
Patient admitted from ED. Patient is alert and oriented x 4. Tele placed and was verified. Patient made comfortable. Will continue to monitor.

## 2016-09-06 NOTE — H&P (Addendum)
[facility] History and Physical  Raven Gray [id number] DOB: 10/04/1927 DOA: 09/06/2016  Referring physician: ED PCP: [name], MD   Chief Complaint: code stroke  HPI: Raven Gray is a 82 y.o. female with significant past medical history of atrial fibrillation on Xarelto, depression, htn, hld, COPD, currently visiting her family members, when they noted around breakfast time, she started complaining of bilateral blurry vision. Complained of the room spinning, became unresponsive for about 5 minutes per family is at bedside. Her speech was also garbled. No loss of consciousness, and per family she quickly improved after 5 mins or so.  She came to the ED as a code stroke by the time she came her speech was improved and her vision was improving.  Now per the ER doctor, she is back to baseline.    Neurology eval pt and recd obs to r/o cva/tia.  Pt currently at baseline, denies no vision deficits, no weakness in extremities, speech is normal as well.   Denies cp/n/v/f/c/cough/sob/diarrhea/constipation.  No urinary or stool incontinence.  Pt's dgt and son-in-law at bedside.   Review of Systems:  Per HPI, o/w all systems reviewed and neg.  Past Medical History:  Diagnosis Date  . Aortic insufficiency    moderate by recent 2-D echocardiogram  . Atrial fibrillation ([place])   . Chest pain   . COPD (chronic obstructive pulmonary disease) ([place])   . History of depression   . History of DVT (deep vein thrombosis)   . History of pulmonary embolism   . Hyperlipidemia   . Hypertension   . Hypokalemia    Now improved with treatment  . Shortness of breath    Past Surgical History:  Procedure Laterality Date  . REPLACEMENT TOTAL KNEE    . ROTATOR CUFF REPAIR     Social History:  reports that she has never smoked. She has never used smokeless tobacco. She reports that she does not drink alcohol or use drugs.  No Known Allergies  Family History  Problem Relation Age of Onset   . Heart attack Father   . Diabetes type II Daughter   . Heart disease Daughter     stents     Prior to Admission medications   Medication Sig Start Date End Date Taking? Authorizing Provider  ALPRAZolam (XANAX) 0.25 MG tablet Take 1 tablet (0.25 mg total) by mouth 2 (two) times daily as needed for anxiety. 10/21/14   Maryann Mikhail, DO  furosemide (LASIX) 40 MG tablet Take 2 tablets by mouth X's 2 days then go back to 1 1/2 tablet by mouth daily Patient taking differently: Take 50 mg by mouth every morning.  04/01/16   Isaiah Serge, NP  ibuprofen (ADVIL,MOTRIN) 200 MG tablet Take 400-600 mg by mouth every 6 (six) hours as needed (for back pain).     Historical Provider, MD  isosorbide mononitrate (IMDUR) 60 MG 24 hr tablet Take 1 tablet (60 mg total) by mouth daily. 02/29/16   Lorretta Harp, MD  metoprolol (LOPRESSOR) 25 MG tablet Take 1 tablet (25 mg total) by mouth 2 (two) times daily. 06/20/14   Lorretta Harp, MD  Multiple Vitamins-Minerals (WOMENS 50+ [place] VITAMIN/MIN) TABS Take 1 tablet by mouth daily.    Historical Provider, MD  nitroGLYCERIN (NITROSTAT) 0.4 MG SL tablet Place 1 tablet under the tongue as needed for chest pain. X 3 doses 02/08/16   Historical Provider, MD  omeprazole (PRILOSEC) 20 MG capsule Take 20 mg by mouth daily  as needed (for reflux/heartburn).  04/23/15   Historical Provider, MD  oxybutynin (DITROPAN-XL) 5 MG 24 hr tablet Take 5 mg by mouth daily. 07/02/15   Historical Provider, MD  potassium chloride SA (K-DUR,KLOR-CON) 20 MEQ tablet Take 60 mEq by mouth every morning.     Historical Provider, MD  pravastatin (PRAVACHOL) 40 MG tablet Take 40 mg by mouth every morning.     Historical Provider, MD  rivaroxaban (XARELTO) 20 MG TABS tablet Take 1 tablet (20 mg total) by mouth every morning. 09/28/15   Lorretta Harp, MD  tiotropium (SPIRIVA) 18 MCG inhalation capsule Place 18 mcg into inhaler and inhale daily as needed (for symptoms).     Historical Provider, MD    venlafaxine XR (EFFEXOR-XR) 150 MG 24 hr capsule Take 150 mg by mouth daily.    Historical Provider, MD  zolpidem (AMBIEN) 5 MG tablet Take 5 mg by mouth at bedtime. 11/03/13   Historical Provider, MD   Physical Exam: Vitals:   09/06/16 1234 09/06/16 1235 09/06/16 1300 09/06/16 1330  BP: 149/81  133/91 140/74  Pulse: 69  91 78  Resp: 19  22 24   Temp: 97.3 F (36.3 C)     TempSrc: Oral     SpO2: 96%  100% 99%  Weight:  90.1 kg (198 lb 10.2 oz)      Wt Readings from Last 3 Encounters:  09/06/16 90.1 kg (198 lb 10.2 oz)  07/29/16 83.9 kg (185 lb)  06/25/16 86.5 kg (190 lb 12.8 oz)    General:  Appears calm and comfortable, pleasant, NAD, AAOx3, pleasant.  Eyes: PERRL, normal lids, irises & conjunctiva, no facial droop noted ENT: grossly normal hearing, lips & tongue, mmm Neck: no  jvd Cardiovascular: RRR, no m/r/g. No LE edema. Telemetry: aFIB, rate controlled  Respiratory: CTA bilaterally, no w/r/r. Normal respiratory effort. Abdomen: soft, ntnd, obese, no g/r. Skin: no rash or induration seen on limited exam Musculoskeletal: grossly normal tone BUE/BLE Psychiatric: grossly normal mood and affect, speech fluent and appropriate Neurologic: grossly non-focal.  Cn 2- 12 intact, speech intact, gait not accessed, no deficits noted in all 4 extremities.          Labs on Admission:  Basic Metabolic Panel:  Recent Labs Lab 09/06/16 1210 09/06/16 1216  NA 141 142  K 4.0 4.0  CL 107 102  CO2 29  --   GLUCOSE 71 67  BUN 19 23*  CREATININE 1.09* 1.00  CALCIUM 9.0  --    Liver Function Tests:  Recent Labs Lab 09/06/16 1210  AST 20  ALT 11*  ALKPHOS 89  BILITOT 0.8  PROT 6.5  ALBUMIN 3.6   No results for input(s): LIPASE, AMYLASE in the last 168 hours. No results for input(s): AMMONIA in the last 168 hours. CBC:  Recent Labs Lab 09/06/16 1210 09/06/16 1216  WBC 6.8  --   NEUTROABS 3.9  --   HGB 11.8* 12.6  HCT 38.0 37.0  MCV 86.8  --   PLT 253  --     Cardiac Enzymes: No results for input(s): CKTOTAL, CKMB, CKMBINDEX, TROPONINI in the last 168 hours.  BNP (last 3 results)  Recent Labs  03/31/16 1130  BNP 255.8*    ProBNP (last 3 results) No results for input(s): PROBNP in the last 8760 hours.  CBG:  Recent Labs Lab 09/06/16 1223  GLUCAP 99    Radiological Exams on Admission: Ct Head Code Stroke W/o Cm  Result Date: 09/06/2016 CLINICAL DATA:  Code stroke. Left-sided weakness. Blurred vision. Slurred speech. Acute onset 1 hour ago. EXAM: CT HEAD WITHOUT CONTRAST TECHNIQUE: Contiguous axial images were obtained from the base of the skull through the vertex without intravenous contrast. COMPARISON:  MRI 10/21/2014 FINDINGS: Brain: Chronic generalized brain atrophy. Chronic small-vessel ischemic changes affecting the hemispheric white matter. 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 Old lacunar infarctions right thalamus. No sign of acute infarction, mass lesion, hemorrhage, hydrocephalus or extra-axial collection. Vascular: There is atherosclerotic calcification of the major vessels at the base of the brain. Skull: Negative Sinuses/Orbits: Clear/normal Other: None significant ASPECTS ([place] Stroke Program Early CT Score) - Ganglionic level infarction (caudate, lentiform nuclei, internal capsule, insula, M1-M3 cortex): 7 - Supraganglionic infarction (M4-M6 cortex): 3 Total score (0-10 with 10 being normal): 10 IMPRESSION: 1. No acute finding. Extensive chronic small-vessel ischemic changes as outlined above. 2. ASPECTS is 10 These results were called by telephone at the time of interpretation on 09/06/2016 at 12:24 pm to Dr. Armida Sans, who verbally acknowledged these results. Electronically Signed   By: Nelson Chimes M.D.   On: 09/06/2016 12:26    EKG: Independently reviewed.   EKG Interpretation  Date/Time:  Saturday September 06 2016 12:32:32 EST Ventricular Rate:  66 PR Interval:    QRS Duration: 163 QT Interval:  448 QTC  Calculation: 470 R Axis:   95 Text Interpretation:  Atrial fibrillation RBBB and LPFB Confirmed by Ashok Cordia  MD, Lennette Bihari ([id number]) on 09/06/2016 12:50:02 PM        Assessment/Plan Principal Problem:   TIA (transient ischemic attack) Active Problems:   Hyperlipidemia   COPD (chronic obstructive pulmonary disease) (HCC)   HTN (hypertension)   GERD (gastroesophageal reflux disease)   Depression   1. Altered mental status w/ blurry vision, garbled speech, concern for TIA, rule out CVA - obs tele - Neuro c/s, appreciate assitance - neuro checks, bedside swallow prior to diet - Check carotid US, echo, Mri brain, mra brain - Continue xarelto, as 325 today. - Already on pravastatin, continued - chk fasting lipids, chk a1c - Advance diet to heart healthy if passes bedside swallow - PT/OT cs  2. Atrial fib - rate controlled, CHADsVASC score at least 4. - continue xarelto, rate control w/ bb  3. htn - controlled - continue imdur and metoprolol, with holding parameters  4. Copd, stable - continue Spiriva, per pt, never smoked.  5. Anxiety/depression - continue effexor  6. gerd - continue ppi  Neurology was c/s, Dr Armida Sans  Code Status: full DVT Prophylaxis: scds, on Xarelto Family Communication:  Patient, dgt and son-in-law at bedside Disposition Plan: obs tele  Time spent: 24mins  Maren Reamer MD., MBA/MHA [facility] Pager [phone]

## 2016-09-06 NOTE — Progress Notes (Signed)
Code stroke called at 1159, Patient arrived to [facility] ED via [facility] at 1210.  As per EMS LSN [id number], was at church when she suddenly developed blurred vision and slurred speech, and was unresponsive with snoring respirations for about 5 minutes.  Patient states she still has some blurred vision, NIHSS 0.

## 2016-09-06 NOTE — Consult Note (Signed)
Referring Physician: Dr Janne Napoleon, ED    Chief Complaint: CODE STROKE, DYSARTHRIA, BLURRED VISION, TRANSIENT UNREPONSIVENESS  HPI:                                                                                                                                         Raven Gray is an 82 y.o. female with a past medical history that is pertinent for HTN, HLD, atrial fibrillation on xarelto, DVT, and depression, brought in by EMS due to acute onset of the above stated symptoms. Patient said that she had " a similar but less intense episode" a week ago. She said that she was at church when she suddenly developed blurred vision and slurred speech, and was unresponsive with snoring respirations for about 5 minutes. Patient states she still has some blurred vision but otherwise denies HA, vertigo, double vision, focal weakness, or visual disturbances./ NIHSS 0. CT head without acute abnormality, ASPECTS 10   Date last known well: 09/06/16 Time last known well: 1115 AM tPA Given: no, has no deficits on exam NIHSS: 0 MRS: 0  Past Medical History:  Diagnosis Date  . Aortic insufficiency    moderate by recent 2-D echocardiogram  . Atrial fibrillation ([place])   . Chest pain   . COPD (chronic obstructive pulmonary disease) ([place])   . History of depression   . History of DVT (deep vein thrombosis)   . History of pulmonary embolism   . Hyperlipidemia   . Hypertension   . Hypokalemia    Now improved with treatment  . Shortness of breath     Past Surgical History:  Procedure Laterality Date  . REPLACEMENT TOTAL KNEE    . ROTATOR CUFF REPAIR      Family History  Problem Relation Age of Onset  . Heart attack Father   . Diabetes type II Daughter   . Heart disease Daughter     stents   Social History:  reports that she has never smoked. She has never used smokeless tobacco. She reports that she does not drink alcohol or use drugs.  Allergies: No Known Allergies  Medications:                                                                                                                            I have reviewed the patient's current medications.  ROS:  History obtained from chart review and the patient  General ROS: negative for - chills, fatigue, fever, night sweats, weight gain or weight loss Psychological ROS: negative for - behavioral disorder, hallucinations, memory difficulties, or suicidal ideation Ophthalmic ROS: negative for -  double vision, eye pain or loss of vision ENT ROS: negative for - epistaxis, nasal discharge, oral lesions, sore throat, tinnitus or vertigo Allergy and Immunology ROS: negative for - hives or itchy/watery eyes Hematological and Lymphatic ROS: negative for - bleeding problems, bruising or swollen lymph nodes Endocrine ROS: negative for - galactorrhea, hair pattern changes, polydipsia/polyuria or temperature intolerance Respiratory ROS: negative for - cough, hemoptysis, shortness of breath or wheezing Cardiovascular ROS: negative for - chest pain, dyspnea on exertion, edema or irregular heartbeat Gastrointestinal ROS: negative for - abdominal pain, diarrhea, hematemesis, nausea/vomiting or stool incontinence Genito-Urinary ROS: negative for - dysuria, hematuria, incontinence or urinary frequency/urgency Musculoskeletal ROS: negative for - joint swelling or muscular weakness Neurological ROS: as noted in HPI Dermatological ROS: negative for rash and skin lesion changes    Physical exam:  Constitutional: well developed, pleasant female in no apparent distress. Blood pressure 149/81, pulse 69, temperature 97.3 F (36.3 C), temperature source Oral, resp. rate 19, weight 90.1 kg (198 lb 10.2 oz), SpO2 96 %. Eyes: no jaundice or exophthalmos.  Head: normocephalic. Neck: supple, no bruits, no JVD. Cardiac: no  murmurs. Lungs: clear. Abdomen: soft, no tender, no mass. Extremities: no edema, clubbing, or cyanosis.  Skin: no rash  Neurologic Examination:                                                                                                      General: NAD Mental Status: Alert, oriented, thought content appropriate.  Speech fluent without evidence of aphasia.  Able to follow 3 step commands without difficulty. Cranial Nerves: II:  Visual fields grossly normal, pupils equal, round, reactive to light and accommodation III,IV, VI: ptosis not present, extra-ocular motions intact bilaterally V,VII: smile symmetric, facial light touch sensation normal bilaterally VIII: hearing normal bilaterally IX,X: uvula rises symmetrically XI: bilateral shoulder shrug XII: midline tongue extension without atrophy or fasciculations  Motor: Right : Upper extremity   5/5    Left:     Upper extremity   5/5  Lower extremity   5/5     Lower extremity   5/5 Tone and bulk:normal tone throughout; no atrophy noted Sensory: Pinprick and light touch intact throughout, bilaterally Deep Tendon Reflexes:  Right: Upper Extremity   Left: Upper extremity   biceps (C-5 to C-6) 2/4   biceps (C-5 to C-6) 2/4 tricep (C7) 2/4    triceps (C7) 2/4 Brachioradialis (C6) 2/4  Brachioradialis (C6) 2/4  Lower Extremity Lower Extremity  quadriceps (L-2 to L-4) 2/4   quadriceps (L-2 to L-4) 2/4 Achilles (S1) 2/4   Achilles (S1) 2/4  Plantars: Right: downgoing   Left: downgoing Cerebellar: normal finger-to-nose,  normal heel-to-shin test Gait:  No tested due to multiple leads    Results for orders placed or performed during the hospital encounter of 09/06/16 (from the  past 48 hour(s))  Protime-INR     Status: Abnormal   Collection Time: 09/06/16 12:10 PM  Result Value Ref Range   Prothrombin Time 23.5 (H) 11.4 - 15.2 seconds   INR 2.06   APTT     Status: Abnormal   Collection Time: 09/06/16 12:10 PM  Result Value  Ref Range   aPTT 41 (H) 24 - 36 seconds    Comment:        IF BASELINE aPTT IS ELEVATED, SUGGEST PATIENT RISK ASSESSMENT BE USED TO DETERMINE APPROPRIATE ANTICOAGULANT THERAPY.   CBC     Status: Abnormal   Collection Time: 09/06/16 12:10 PM  Result Value Ref Range   WBC 6.8 4.0 - 10.5 K/uL   RBC 4.38 3.87 - 5.11 MIL/uL   Hemoglobin 11.8 (L) 12.0 - 15.0 g/dL   HCT 38.0 36.0 - 46.0 %   MCV 86.8 78.0 - 100.0 fL   MCH 26.9 26.0 - 34.0 pg   MCHC 31.1 30.0 - 36.0 g/dL   RDW 14.4 11.5 - 15.5 %   Platelets 253 150 - 400 K/uL  Differential     Status: None   Collection Time: 09/06/16 12:10 PM  Result Value Ref Range   Neutrophils Relative % 58 %   Neutro Abs 3.9 1.7 - 7.7 K/uL   Lymphocytes Relative 28 %   Lymphs Abs 1.9 0.7 - 4.0 K/uL   Monocytes Relative 10 %   Monocytes Absolute 0.6 0.1 - 1.0 K/uL   Eosinophils Relative 3 %   Eosinophils Absolute 0.2 0.0 - 0.7 K/uL   Basophils Relative 1 %   Basophils Absolute 0.1 0.0 - 0.1 K/uL  Comprehensive metabolic panel     Status: Abnormal   Collection Time: 09/06/16 12:10 PM  Result Value Ref Range   Sodium 141 135 - 145 mmol/L   Potassium 4.0 3.5 - 5.1 mmol/L   Chloride 107 101 - 111 mmol/L   CO2 29 22 - 32 mmol/L   Glucose, Bld 71 65 - 99 mg/dL   BUN 19 6 - 20 mg/dL   Creatinine, Ser 1.09 (H) 0.44 - 1.00 mg/dL   Calcium 9.0 8.9 - 10.3 mg/dL   Total Protein 6.5 6.5 - 8.1 g/dL   Albumin 3.6 3.5 - 5.0 g/dL   AST 20 15 - 41 U/L   ALT 11 (L) 14 - 54 U/L   Alkaline Phosphatase 89 38 - 126 U/L   Total Bilirubin 0.8 0.3 - 1.2 mg/dL   GFR calc non Af Amer 45 (L) >60 mL/min   GFR calc Af Amer 52 (L) >60 mL/min    Comment: (NOTE) The eGFR has been calculated using the CKD EPI equation. This calculation has not been validated in all clinical situations. eGFR's persistently <60 mL/min signify possible Chronic Kidney Disease.    Anion gap 5 5 - 15  I-stat troponin, ED     Status: None   Collection Time: 09/06/16 12:14 PM  Result  Value Ref Range   Troponin i, poc 0.05 0.00 - 0.08 ng/mL   Comment 3            Comment: Due to the release kinetics of cTnI, a negative result within the first hours of the onset of symptoms does not rule out myocardial infarction with certainty. If myocardial infarction is still suspected, repeat the test at appropriate intervals.   I-Stat Chem 8, ED     Status: Abnormal   Collection Time: 09/06/16 12:16 PM  Result Value  Ref Range   Sodium 142 135 - 145 mmol/L   Potassium 4.0 3.5 - 5.1 mmol/L   Chloride 102 101 - 111 mmol/L   BUN 23 (H) 6 - 20 mg/dL   Creatinine, Ser 1.00 0.44 - 1.00 mg/dL   Glucose, Bld 67 65 - 99 mg/dL   Calcium, Ion 1.19 1.15 - 1.40 mmol/L   TCO2 29 0 - 100 mmol/L   Hemoglobin 12.6 12.0 - 15.0 g/dL   HCT 37.0 36.0 - 46.0 %  CBG monitoring, ED     Status: None   Collection Time: 09/06/16 12:23 PM  Result Value Ref Range   Glucose-Capillary 99 65 - 99 mg/dL   Ct Head Code Stroke W/o Cm  Result Date: 09/06/2016 CLINICAL DATA:  Code stroke. Left-sided weakness. Blurred vision. Slurred speech. Acute onset 1 hour ago. EXAM: CT HEAD WITHOUT CONTRAST TECHNIQUE: Contiguous axial images were obtained from the base of the skull through the vertex without intravenous contrast. COMPARISON:  MRI 10/21/2014 FINDINGS: Brain: Chronic generalized brain atrophy. Chronic small-vessel ischemic changes affecting the hemispheric white matter. 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 Old lacunar infarctions right thalamus. No sign of acute infarction, mass lesion, hemorrhage, hydrocephalus or extra-axial collection. Vascular: There is atherosclerotic calcification of the major vessels at the base of the brain. Skull: Negative Sinuses/Orbits: Clear/normal Other: None significant ASPECTS ([place] Stroke Program Early CT Score) - Ganglionic level infarction (caudate, lentiform nuclei, internal capsule, insula, M1-M3 cortex): 7 - Supraganglionic infarction (M4-M6 cortex): 3 Total score (0-10  with 10 being normal): 10 IMPRESSION: 1. No acute finding. Extensive chronic small-vessel ischemic changes as outlined above. 2. ASPECTS is 10 These results were called by telephone at the time of interpretation on 09/06/2016 at 12:24 pm to Dr. Armida Sans, who verbally acknowledged these results. Electronically Signed   By: Nelson Chimes M.D.   On: 09/06/2016 12:26     Assessment: 82 y.o. female with blurred vision, slurred speech, transient unresponsiveness. Symptoms had resolved. She certainly has several risk factors for stroke and thus TIA is a consideration, but syncope and seizure with impairment of consciousness also in the differential. Admit for TIA work up. EEG.  Stroke Risk Factors - atrial fibrillation, hyperlipidemia and hypertension  Plan: 1. HgbA1c, fasting lipid panel 2. MRI, MRA  of the brain without contrast 3. Echocardiogram 4. Carotid dopplers 5. Prophylactic therapy-Anticoagulation with xarelto 6. Risk factor modification 7. Telemetry monitoring 8. Frequent neuro checks 9. PT/OT SLP  Dorian Pod, MD [facility] [phone]  09/06/2016, 1:06 PM

## 2016-09-06 NOTE — ED Provider Notes (Signed)
[facility] DEPT Provider Note   CSN: [id number] Arrival date & time: 09/06/16  1210   An emergency department physician performed an initial assessment on this suspected stroke patient at 1212.  History   Chief Complaint Chief Complaint  Patient presents with  . Code Stroke    HPI Raven Gray is a 82 y.o. female.  Patient c/o slurred speech and change in vision/blurry vision earlier today while having breakfast with family.  EMS describes that family noted approximately 5 minute period of unresponsiveness and garbled speech. Patient denies any unilateral numbness or weakness. No headaches. On arrival to ED, notes vision is returning to normal, and speech is normal. Denies other recent similar symptoms.       Past Medical History:  Diagnosis Date  . Aortic insufficiency    moderate by recent 2-D echocardiogram  . Atrial fibrillation ([place])   . Chest pain   . COPD (chronic obstructive pulmonary disease) ([place])   . History of depression   . History of DVT (deep vein thrombosis)   . History of pulmonary embolism   . Hyperlipidemia   . Hypertension   . Hypokalemia    Now improved with treatment  . Shortness of breath     Patient Active Problem List   Diagnosis Date Noted  . Hyperlipidemia 10/23/2015  . Aortic insufficiency 12/15/2014  . Edema of both legs 09/19/2014  . Atrial fibrillation ([place]) 11/21/2013  . Chest pain 11/21/2013  . Community acquired pneumonia 06/19/2012  . Pulmonary embolism ([place]) 06/19/2012  . Hypokalemia 06/18/2012  . COPD with acute exacerbation ([place]) 06/18/2012  . CHF exacerbation ([place]) 06/18/2012  . Hypoxia 06/18/2012    Past Surgical History:  Procedure Laterality Date  . REPLACEMENT TOTAL KNEE    . ROTATOR CUFF REPAIR      OB History    No data available       Home Medications    Prior to Admission medications   Medication Sig Start Date End Date Taking? Authorizing Provider  ALPRAZolam (XANAX) 0.25 MG tablet Take 1 tablet  (0.25 mg total) by mouth 2 (two) times daily as needed for anxiety. 10/21/14   Maryann Mikhail, DO  furosemide (LASIX) 40 MG tablet Take 2 tablets by mouth X's 2 days then go back to 1 1/2 tablet by mouth daily Patient taking differently: Take 50 mg by mouth every morning.  04/01/16   Isaiah Serge, NP  ibuprofen (ADVIL,MOTRIN) 200 MG tablet Take 400-600 mg by mouth every 6 (six) hours as needed (for back pain).     Historical Provider, MD  isosorbide mononitrate (IMDUR) 60 MG 24 hr tablet Take 1 tablet (60 mg total) by mouth daily. 02/29/16   Lorretta Harp, MD  metoprolol (LOPRESSOR) 25 MG tablet Take 1 tablet (25 mg total) by mouth 2 (two) times daily. 06/20/14   Lorretta Harp, MD  Multiple Vitamins-Minerals (WOMENS 50+ [place] VITAMIN/MIN) TABS Take 1 tablet by mouth daily.    Historical Provider, MD  nitroGLYCERIN (NITROSTAT) 0.4 MG SL tablet Place 1 tablet under the tongue as needed for chest pain. X 3 doses 02/08/16   Historical Provider, MD  omeprazole (PRILOSEC) 20 MG capsule Take 20 mg by mouth daily as needed (for reflux/heartburn).  04/23/15   Historical Provider, MD  oxybutynin (DITROPAN-XL) 5 MG 24 hr tablet Take 5 mg by mouth daily. 07/02/15   Historical Provider, MD  potassium chloride SA (K-DUR,KLOR-CON) 20 MEQ tablet Take 60 mEq by mouth every morning.  Historical Provider, MD  pravastatin (PRAVACHOL) 40 MG tablet Take 40 mg by mouth every morning.     Historical Provider, MD  rivaroxaban (XARELTO) 20 MG TABS tablet Take 1 tablet (20 mg total) by mouth every morning. 09/28/15   Lorretta Harp, MD  tiotropium (SPIRIVA) 18 MCG inhalation capsule Place 18 mcg into inhaler and inhale daily as needed (for symptoms).     Historical Provider, MD  venlafaxine XR (EFFEXOR-XR) 150 MG 24 hr capsule Take 150 mg by mouth daily.    Historical Provider, MD  zolpidem (AMBIEN) 5 MG tablet Take 5 mg by mouth at bedtime. 11/03/13   Historical Provider, MD    Family History Family History  Problem  Relation Age of Onset  . Heart attack Father   . Diabetes type II Daughter   . Heart disease Daughter     stents    Social History Social History  Substance Use Topics  . Smoking status: Never Smoker  . Smokeless tobacco: Never Used  . Alcohol use No     Allergies   Patient has no known allergies.   Review of Systems Review of Systems  Constitutional: Negative for chills and fever.  HENT: Negative for trouble swallowing.   Eyes: Positive for visual disturbance.  Respiratory: Negative for shortness of breath.   Cardiovascular: Negative for chest pain.  Gastrointestinal: Negative for abdominal pain.  Genitourinary: Negative for dysuria and flank pain.  Musculoskeletal: Negative for back pain and neck pain.  Skin: Negative for rash.  Neurological: Positive for speech difficulty. Negative for weakness, numbness and headaches.  Hematological: Does not bruise/bleed easily.  Psychiatric/Behavioral: Negative for confusion.     Physical Exam Updated Vital Signs BP 149/81 (BP Location: Left Arm)   Pulse 69   Temp 97.3 F (36.3 C) (Oral)   Resp 19   Wt 90.1 kg   SpO2 96%   BMI 35.19 kg/m   Physical Exam  Constitutional: She is oriented to person, place, and time. She appears well-developed and well-nourished. No distress.  HENT:  Head: Atraumatic.  Mouth/Throat: Oropharynx is clear and moist.  Eyes: Conjunctivae and EOM are normal. Pupils are equal, round, and reactive to light. No scleral icterus.  Neck: Neck supple. No tracheal deviation present.  No bruits.  Cardiovascular: Normal rate, normal heart sounds and intact distal pulses.   Irregular rhythm.  Pulmonary/Chest: Effort normal and breath sounds normal. No respiratory distress.  Abdominal: Soft. Normal appearance. She exhibits no distension. There is no tenderness.  Genitourinary:  Genitourinary Comments: No cva tenderness  Musculoskeletal: She exhibits no edema.  Neurological: She is alert and oriented to  person, place, and time. No cranial nerve deficit.  Speech clear/fluent. Motor intact bil. stre 5/5. sens grossly intact.   Skin: Skin is warm and dry. No rash noted. She is not diaphoretic.  Psychiatric: She has a normal mood and affect.  Nursing note and vitals reviewed.    ED Treatments / Results  Labs (all labs ordered are listed, but only abnormal results are displayed) Labs Reviewed  PROTIME-INR - Abnormal; Notable for the following:       Result Value   Prothrombin Time 23.5 (*)    All other components within normal limits  APTT - Abnormal; Notable for the following:    aPTT 41 (*)    All other components within normal limits  CBC - Abnormal; Notable for the following:    Hemoglobin 11.8 (*)    All other components within normal limits  I-STAT CHEM 8, ED - Abnormal; Notable for the following:    BUN 23 (*)    All other components within normal limits  DIFFERENTIAL  COMPREHENSIVE METABOLIC PANEL  I-STAT TROPOININ, ED  CBG MONITORING, ED    EKG  EKG Interpretation None       Radiology Ct Head Code Stroke W/o Cm  Result Date: 09/06/2016 CLINICAL DATA:  Code stroke. Left-sided weakness. Blurred vision. Slurred speech. Acute onset 1 hour ago. EXAM: CT HEAD WITHOUT CONTRAST TECHNIQUE: Contiguous axial images were obtained from the base of the skull through the vertex without intravenous contrast. COMPARISON:  MRI 10/21/2014 FINDINGS: Brain: Chronic generalized brain atrophy. Chronic small-vessel ischemic changes affecting the hemispheric white matter. 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 Old lacunar infarctions right thalamus. No sign of acute infarction, mass lesion, hemorrhage, hydrocephalus or extra-axial collection. Vascular: There is atherosclerotic calcification of the major vessels at the base of the brain. Skull: Negative Sinuses/Orbits: Clear/normal Other: None significant ASPECTS ([place] Stroke Program Early CT Score) - Ganglionic level infarction (caudate,  lentiform nuclei, internal capsule, insula, M1-M3 cortex): 7 - Supraganglionic infarction (M4-M6 cortex): 3 Total score (0-10 with 10 being normal): 10 IMPRESSION: 1. No acute finding. Extensive chronic small-vessel ischemic changes as outlined above. 2. ASPECTS is 10 These results were called by telephone at the time of interpretation on 09/06/2016 at 12:24 pm to Dr. Armida Sans, who verbally acknowledged these results. Electronically Signed   By: Nelson Chimes M.D.   On: 09/06/2016 12:26    Procedures Procedures (including critical care time)  Medications Ordered in ED Medications - No data to display   Initial Impression / Assessment and Plan / ED Course  I have reviewed the triage vital signs and the nursing notes.  Pertinent labs & imaging results that were available during my care of the patient were reviewed by me and considered in my medical decision making (see chart for details).  Clinical Course     Iv ns. Labs. Stat ct.   Stroke team, Dr Aram Beecham evaluated in ED, and recommends admission to medical service for tia/cva workup.  Reviewed nursing notes and prior charts for additional history.   Discussed pt with [facility] - requests temp orders to tele bed.   Recheck pt, symptoms remain resolved.     Final Clinical Impressions(s) / ED Diagnoses   Final diagnoses:  None    New Prescriptions New Prescriptions   No medications on file     Lajean Saver, MD 09/06/16 1300

## 2016-09-06 NOTE — Evaluation (Signed)
Physical Therapy Evaluation Patient Details Name: Raven Gray MRN: [id number] DOB: 1928-09-14 Today's Date: 09/06/2016   History of Present Illness  Patient is an 82 yo female admitted 09/06/16 with changes in speech and vision.  CVA/TIA workup.  MRI negative for acute stroke.    PMH:  Afib, depression, HTN, HLD, COPD, DVT/PE  Clinical Impression  Patient is functioning at supervision to Mod I level with mobility and gait.  Patient at her baseline functional level per patient and daughter.  No acute PT indicated - PT will sign off.    Follow Up Recommendations No PT follow up;Supervision - Intermittent    Equipment Recommendations  None recommended by PT    Recommendations for Other Services       Precautions / Restrictions Precautions Precautions: None Restrictions Weight Bearing Restrictions: No      Mobility  Bed Mobility Overal bed mobility: Modified Independent             General bed mobility comments: Increased time  Transfers Overall transfer level: Needs assistance Equipment used: Rolling walker (2 wheeled) Transfers: Sit to/from Stand Sit to Stand: Supervision         General transfer comment: Verbal cues for hand placement.  No physical assist needed.  Ambulation/Gait Ambulation/Gait assistance: Min guard Ambulation Distance (Feet): 80 Feet Assistive device: Rolling walker (2 wheeled) Gait Pattern/deviations: Step-through pattern;Decreased stride length;Trunk flexed;Shuffle Gait velocity: decreased Gait velocity interpretation: Below normal speed for age/gender General Gait Details: Verbal cues to keep feet inside RW and stand upright during gait.  Cues to move slowly during turns.  Stairs            Wheelchair Mobility    Modified Rankin (Stroke Patients Only) Modified Rankin (Stroke Patients Only) Pre-Morbid Rankin Score: Slight disability Modified Rankin: Slight disability     Balance Overall balance assessment: Needs  assistance;History of Falls Sitting-balance support: No upper extremity supported;Feet supported Sitting balance-Leahy Scale: Good     Standing balance support: Bilateral upper extremity supported Standing balance-Leahy Scale: Poor Standing balance comment: UE support for balance                             Pertinent Vitals/Pain Pain Assessment: No/denies pain    Home Living Family/patient expects to be discharged to:: Private residence Living Arrangements: Alone Available Help at Discharge: Family;Available 24 hours/day (3 daughters live nearby) Type of Home: House Home Access: Ramped entrance     Home Layout: One level Home Equipment: Walker - 2 wheels;Wheelchair - manual;Cane - single point;Bedside commode      Prior Function Level of Independence: Independent with assistive device(s)         Comments: Using RW for ambulation.  Takes "pan baths".  Stopped driving recently.     Hand Dominance        Extremity/Trunk Assessment   Upper Extremity Assessment Upper Extremity Assessment: Defer to OT evaluation    Lower Extremity Assessment Lower Extremity Assessment: Generalized weakness (Strength symmetrical)       Communication   Communication: No difficulties  Cognition Arousal/Alertness: Awake/alert Behavior During Therapy: WFL for tasks assessed/performed Overall Cognitive Status: Within Functional Limits for tasks assessed                 General Comments: At times, difficulty answering some home environment questions.    General Comments General comments (skin integrity, edema, etc.): Noted reddness and warmth in RLE, anterior and lateral areas of lower  leg.  RN in room and shown RLE issues.    Exercises     Assessment/Plan    PT Assessment Patent does not need any further PT services  PT Problem List            PT Treatment Interventions      PT Goals (Current goals can be found in the Care Plan section)  Acute Rehab PT  Goals PT Goal Formulation: All assessment and education complete, DC therapy    Frequency     Barriers to discharge        Co-evaluation               End of Session Equipment Utilized During Treatment: Gait belt Activity Tolerance: Patient tolerated treatment well;Patient limited by fatigue Patient left: in bed;with call bell/phone within reach;with bed alarm set;with nursing/sitter in room;with family/visitor present;with SCD's reapplied Nurse Communication: Mobility status;Other (comment) (Area of warmth/reddness Rt lower leg)    Functional Assessment Tool Used: Clinical judgement Functional Limitation: Mobility: Walking and moving around Mobility: Walking and Moving Around Current Status [phone]): At least 1 percent but less than 20 percent impaired, limited or restricted Mobility: Walking and Moving Around Goal Status [phone]): At least 1 percent but less than 20 percent impaired, limited or restricted Mobility: Walking and Moving Around Discharge Status [phone]): At least 1 percent but less than 20 percent impaired, limited or restricted    Time: [id number] PT Time Calculation (min) (ACUTE ONLY): 18 min   Charges:   PT Evaluation $PT Eval Low Complexity: 1 Procedure     PT G Codes:   PT G-Codes **NOT FOR INPATIENT CLASS** Functional Assessment Tool Used: Clinical judgement Functional Limitation: Mobility: Walking and moving around Mobility: Walking and Moving Around Current Status [id number]): At least 1 percent but less than 20 percent impaired, limited or restricted Mobility: Walking and Moving Around Goal Status [phone]): At least 1 percent but less than 20 percent impaired, limited or restricted Mobility: Walking and Moving Around Discharge Status [phone]): At least 1 percent but less than 20 percent impaired, limited or restricted    Despina Pole 09/06/2016, 7:26 PM Carita Pian. Sanjuana Kava, [place] Pager [phone]

## 2016-09-06 NOTE — ED Triage Notes (Signed)
Per [facility] patient was having pictures taken with her family when family states she became unresponsive for five minutes followed by slurred speech and blurred vision. Patient and EMS agree slurred speech resolved PTA at [facility], patient states blurred vision has improved.  Patient also complains of left middle finger numbness which she states is not a new issue.  Patient alert and oriented at this time and in no apparent distress.

## 2016-09-07 ENCOUNTER — Observation Stay (HOSPITAL_BASED_OUTPATIENT_CLINIC_OR_DEPARTMENT_OTHER): Payer: Medicare Other

## 2016-09-07 DIAGNOSIS — I4891 Unspecified atrial fibrillation: Secondary | ICD-10-CM | POA: Diagnosis not present

## 2016-09-07 DIAGNOSIS — G450 Vertebro-basilar artery syndrome: Secondary | ICD-10-CM | POA: Diagnosis not present

## 2016-09-07 DIAGNOSIS — G459 Transient cerebral ischemic attack, unspecified: Secondary | ICD-10-CM

## 2016-09-07 LAB — VAS US CAROTID
LCCADDIAS: -12 cm/s
LCCADSYS: -51 cm/s
LEFT ECA DIAS: -6 cm/s
LEFT VERTEBRAL DIAS: 10 cm/s
LICADDIAS: -18 cm/s
LICAPDIAS: 14 cm/s
LICAPSYS: 44 cm/s
Left CCA prox dias: 16 cm/s
Left CCA prox sys: 71 cm/s
Left ICA dist sys: -62 cm/s
RCCAPDIAS: 14 cm/s
RCCAPSYS: 63 cm/s
RIGHT ECA DIAS: -11 cm/s
RIGHT VERTEBRAL DIAS: -6 cm/s
Right cca dist sys: -32 cm/s

## 2016-09-07 LAB — LIPID PANEL
CHOL/HDL RATIO: 2.5 ratio
CHOLESTEROL: 106 mg/dL (ref 0–200)
HDL: 43 mg/dL (ref >40)
LDL Cholesterol: 52 mg/dL (ref 0–99)
Triglycerides: 56 mg/dL (ref <150)
VLDL: 11 mg/dL (ref 0–40)

## 2016-09-07 LAB — ECHOCARDIOGRAM COMPLETE
HEIGHTINCHES: 63 in
Weight: 2996.8 oz

## 2016-09-07 MED ORDER — RIVAROXABAN 15 MG PO TABS: 15.0000 mg | ORAL_TABLET | Freq: Every morning | ORAL | 0 refills | Status: DC

## 2016-09-07 MED ORDER — RIVAROXABAN 15 MG PO TABS
15.0000 mg | ORAL_TABLET | Freq: Every morning | ORAL | Status: DC
Start: 2016-09-08 — End: 2016-09-07

## 2016-09-07 MED ORDER — RIVAROXABAN 15 MG PO TABS: 15.0000 mg | ORAL_TABLET | Freq: Every day | ORAL | Status: DC

## 2016-09-07 NOTE — Progress Notes (Signed)
  Echocardiogram 2D Echocardiogram has been performed.  Jennette Dubin 09/07/2016, 11:07 AM

## 2016-09-07 NOTE — Discharge Instructions (Signed)
Transient Ischemic Attack A transient ischemic attack (TIA) is a "warning stroke" that causes stroke-like symptoms. Unlike a stroke, a TIA does not cause permanent damage to the brain. The symptoms of a TIA can happen very fast and do not last long. It is important to know the symptoms of a TIA and what to do. This can help prevent a major stroke or death. What are the causes? A TIA is caused by a temporary blockage in an artery in the brain or neck (carotid artery). The blockage does not allow the brain to get the blood supply it needs and can cause different symptoms. The blockage can be caused by either:  A blood clot.  Fatty buildup (plaque) in a neck or brain artery. What increases the risk?  High blood pressure (hypertension).  High cholesterol.  Diabetes mellitus.  Heart disease.  The buildup of plaque in the blood vessels (peripheral artery disease or atherosclerosis).  The buildup of plaque in the blood vessels that provide blood and oxygen to the brain (carotid artery stenosis).  An abnormal heart rhythm (atrial fibrillation).  Obesity.  Using any tobacco products, including cigarettes, chewing tobacco, or electronic cigarettes.  Taking oral contraceptives, especially in combination with using tobacco.  Physical inactivity.  A diet high in fats, salt (sodium), and calories.  Excessive alcohol use.  Use of illegal drugs (especially cocaine and methamphetamine).  Being female.  Being African American.  Being over the age of 28 years.  Family history of stroke.  Previous history of blood clots, stroke, TIA, or heart attack.  Sickle cell disease. What are the signs or symptoms? TIA symptoms are the same as a stroke but are temporary. These symptoms usually develop suddenly, or may be newly present upon waking from sleep:  Sudden weakness or numbness of the face, arm, or leg, especially on one side of the body.  Sudden trouble walking or difficulty moving  arms or legs.  Sudden confusion.  Sudden personality changes.  Trouble speaking (aphasia) or understanding.  Difficulty swallowing.  Sudden trouble seeing in one or both eyes.  Double vision.  Dizziness.  Loss of balance or coordination.  Sudden severe headache with no known cause.  Trouble reading or writing.  Loss of bowel or bladder control.  Loss of consciousness. How is this diagnosed? Your health care provider may be able to determine the presence or absence of a TIA based on your symptoms, history, and physical exam. CT scan of the brain is usually performed to help identify a TIA. Other tests may include:  Electrocardiography (ECG).  Continuous heart monitoring.  Echocardiography.  Carotid ultrasonography.  MRI.  A scan of the brain circulation.  Blood tests. How is this treated? Since the symptoms of TIA are the same as a stroke, it is important to seek treatment as soon as possible. You may need a medicine to dissolve a blood clot (thrombolytic) if that is the cause of the TIA. This medicine cannot be given if too much time has passed. Treatment may also include:  Rest, oxygen, fluids through an IV tube, and medicines to thin the blood (anticoagulants).  Measures will be taken to prevent short-term and long-term complications, including infection from breathing foreign material into the lungs (aspiration pneumonia), blood clots in the legs, and falls.  Procedures to either remove plaque in the carotid arteries or dilate carotid arteries that have narrowed due to plaque. Those procedures are:  Carotid endarterectomy.  Carotid angioplasty and stenting.  Medicines and diet  may be used to address diabetes, high blood pressure, and other underlying risk factors. Follow these instructions at home:  Take medicines only as directed by your health care provider. Follow the directions carefully. Medicines may be used to control risk factors for a stroke. Be  sure you understand all your medicine instructions.  You may be told to take aspirin or the anticoagulant warfarin. Warfarin needs to be taken exactly as instructed.  Taking too much or too little warfarin is dangerous. Too much warfarin increases the risk of bleeding. Too little warfarin continues to allow the risk for blood clots. While taking warfarin, you will need to have regular blood tests to measure your blood clotting time. A PT blood test measures how long it takes for blood to clot. Your PT is used to calculate another value called an INR. Your PT and INR help your health care provider to adjust your dose of warfarin. The dose can change for many reasons. It is critically important that you take warfarin exactly as prescribed.  Many foods, especially foods high in vitamin K can interfere with warfarin and affect the PT and INR. Foods high in vitamin K include spinach, kale, broccoli, cabbage, collard and turnip greens, Brussels sprouts, peas, cauliflower, seaweed, and parsley, as well as beef and pork liver, green tea, and soybean oil. You should eat a consistent amount of foods high in vitamin K. Avoid major changes in your diet, or notify your health care provider before changing your diet. Arrange a visit with a dietitian to answer your questions.  Many medicines can interfere with warfarin and affect the PT and INR. You must tell your health care provider about any and all medicines you take; this includes all vitamins and supplements. Be especially cautious with aspirin and anti-inflammatory medicines. Do not take or discontinue any prescribed or over-the-counter medicine except on the advice of your health care provider or pharmacist.  Warfarin can have side effects, such as excessive bruising or bleeding. You will need to hold pressure over cuts for longer than usual. Your health care provider or pharmacist will discuss other potential side effects.  Avoid sports or activities that may  cause injury or bleeding.  Be careful when shaving, flossing your teeth, or handling sharp objects.  Alcohol can change the body's ability to handle warfarin. It is best to avoid alcoholic drinks or consume only very small amounts while taking warfarin. Notify your health care provider if you change your alcohol intake.  Notify your dentist or other health care providers before procedures.  Eat a diet that includes 5 or more servings of fruits and vegetables each day. This may reduce the risk of stroke. Certain diets may be prescribed to address high blood pressure, high cholesterol, diabetes, or obesity.  A diet low in sodium, saturated fat, trans fat, and cholesterol is recommended to manage high blood pressure.  A diet low in saturated fat, trans fat, and cholesterol, and high in fiber may control cholesterol levels.  A controlled-carbohydrate, controlled-sugar diet is recommended to manage diabetes.  A reduced-calorie diet that is low in sodium, saturated fat, trans fat, and cholesterol is recommended to manage obesity.  Maintain a healthy weight.  Stay physically active. It is recommended that you get at least 30 minutes of activity on most or all days.  Do not use any tobacco products, including cigarettes, chewing tobacco, or electronic cigarettes. If you need help quitting, ask your health care provider.  Limit alcohol intake to no more  than 1 drink per day for nonpregnant women and 2 drinks per day for men. One drink equals 12 ounces of beer, 5 ounces of wine, or 1 ounces of hard liquor.  Do not abuse drugs.  A safe home environment is important to reduce the risk of falls. Your health care provider may arrange for specialists to evaluate your home. Having grab bars in the bedroom and bathroom is often important. Your health care provider may arrange for equipment to be used at home, such as raised toilets and a seat for the shower.  Follow all instructions for follow-up with  your health care provider. This is very important. This includes any referrals and lab tests. Proper follow-up can prevent a stroke or another TIA from occurring. How is this prevented? The risk of a TIA can be decreased by appropriately treating high blood pressure, high cholesterol, diabetes, heart disease, and obesity, and by quitting smoking, limiting alcohol, and staying physically active. Contact a health care provider if:  You have personality changes.  You have difficulty swallowing.  You are seeing double.  You have dizziness.  You have a fever. Get help right away if: Any of the following symptoms may represent a serious problem that is an emergency. Do not wait to see if the symptoms will go away. Get medical help right away. Call your local emergency services (911 in [place] Do not drive yourself to the hospital.  You have sudden weakness or numbness of the face, arm, or leg, especially on one side of the body.  You have sudden trouble walking or difficulty moving arms or legs.  You have sudden confusion.  You have trouble speaking (aphasia) or understanding.  You have sudden trouble seeing in one or both eyes.  You have a loss of balance or coordination.  You have a sudden, severe headache with no known cause.  You have new chest pain or an irregular heartbeat.  You have a partial or total loss of consciousness. This information is not intended to replace advice given to you by your health care provider. Make sure you discuss any questions you have with your health care provider. Document Released: 06/18/2005 Document Revised: 05/12/2016 Document Reviewed: 12/14/2013 [employer] Interactive Patient Education  2017 [employer]   Transient Ischemic Attack A transient ischemic attack (TIA) is a "warning stroke" that causes stroke-like symptoms. A TIA does not cause lasting damage to the brain. The symptoms of a TIA can happen fast and do not last long. It is important  to know the symptoms of a TIA and what to do. This can help prevent stroke or death. Follow these instructions at home:  Take medicines only as told by your doctor. Make sure you understand all of the instructions.  You may need to take aspirin or warfarin medicine. Warfarin needs to be taken exactly as told.  Taking too much or too little warfarin is dangerous. Blood tests must be done as often as told by your doctor. A PT blood test measures how long it takes for blood to clot. Your PT is used to calculate another value called an INR. Your PT and INR help your doctor adjust your warfarin dosage. He or she will make sure you are taking the right amount.  Food can cause problems with warfarin and affect the results of your blood tests. This is true for foods high in vitamin K. Eat the same amount of foods high in vitamin K each day. Foods high in vitamin K  include spinach, kale, broccoli, cabbage, collard and turnip greens, Brussels sprouts, peas, cauliflower, seaweed, and parsley. Other foods high in vitamin K include beef and pork liver, green tea, and soybean oil. Eat the same amount of foods high in vitamin K each day. Avoid big changes in your diet. Tell your doctor before changing your diet. Talk to a food specialist (dietitian) if you have questions.  Many medicines can cause problems with warfarin and affect your PT and INR. Tell your doctor about all medicines you take. This includes vitamins and dietary pills (supplements). Do not take or stop taking any prescribed or over-the-counter medicines unless your doctor tells you to.  Warfarin can cause more bruising or bleeding. Hold pressure over any cuts for longer than normal. Talk to your doctor about other side effects of warfarin.  Avoid sports or activities that may cause injury or bleeding.  Be careful when you shave, floss, or use sharp objects.  Avoid or drink very little alcohol while taking warfarin. Tell your doctor if you change  how much alcohol you drink.  Tell your dentist and other doctors that you take warfarin before any procedures.  Follow your diet program as told, if you are given one.  Keep a healthy weight.  Stay active. Try to get at least 30 minutes of activity on all or most days.  Do not use any tobacco products, including cigarettes, chewing tobacco, or electronic cigarettes. If you need help quitting, ask your doctor.  Limit alcohol intake to no more than 1 drink per day for nonpregnant women and 2 drinks per day for men. One drink equals 12 ounces of beer, 5 ounces of wine, or 1 ounces of hard liquor.  Do not abuse drugs.  Keep your home safe so you do not fall. You can do this by:  Putting grab bars in the bedroom and bathroom.  Raising toilet seats.  Putting a seat in the shower.  Keep all follow-up visits as told by your doctor. This is important. Contact a doctor if:  Your personality changes.  You have trouble swallowing.  You have double vision.  You are dizzy.  You have a fever. Get help right away if: These symptoms may be an emergency. Do not wait to see if the symptoms will go away. Get medical help right away. Call your local emergency services (911 in the [place] Do not drive yourself to the hospital.  You have sudden weakness or lose feeling (go numb), especially on one side of the body. This can affect your:  Face.  Arm.  Leg.  You have sudden trouble walking.  You have sudden trouble moving your arms or legs.  You have sudden confusion.  You have trouble talking.  You have trouble understanding.  You have sudden trouble seeing in one or both eyes.  You lose your balance.  Your movements are not smooth.  You have a sudden, very bad headache with no known cause.  You have new chest pain.  Your heartbeat is unsteady.  You are partly or totally unaware of what is going on around you. This information is not intended to replace advice given to  you by your health care provider. Make sure you discuss any questions you have with your health care provider. Document Released: 06/17/2008 Document Revised: 05/12/2016 Document Reviewed: 12/14/2013 [employer] Interactive Patient Education  2017 [employer] Rivaroxaban oral tablets What is this medicine? RIVAROXABAN (ri va ROX a ban) is an anticoagulant (blood thinner). It is  used to treat blood clots in the lungs or in the veins. It is also used after knee or hip surgeries to prevent blood clots. It is also used to lower the chance of stroke in people with a medical condition called atrial fibrillation. COMMON BRAND NAME(S): Xarelto, Xarelto Starter Pack What should I tell my health care provider before I take this medicine? They need to know if you have any of these conditions: -bleeding disorders -bleeding in the brain -blood in your stools (black or tarry stools) or if you have blood in your vomit -history of stomach bleeding -kidney disease -liver disease -low blood counts, like low white cell, platelet, or red cell counts -recent or planned spinal or epidural procedure -take medicines that treat or prevent blood clots -an unusual or allergic reaction to rivaroxaban, other medicines, foods, dyes, or preservatives -pregnant or trying to get pregnant -breast-feeding How should I use this medicine? Take this medicine by mouth with a glass of water. Follow the directions on the prescription label. Take your medicine at regular intervals. Do not take it more often than directed. Do not stop taking except on your doctor's advice. Stopping this medicine may increase your risk of a blood clot. Be sure to refill your prescription before you run out of medicine. If you are taking this medicine after hip or knee replacement surgery, take it with or without food. If you are taking this medicine for atrial fibrillation, take it with your evening meal. If you are taking this medicine to treat blood  clots, take it with food at the same time each day. If you are unable to swallow your tablet, you may crush the tablet and mix it in applesauce. Then, immediately eat the applesauce. You should eat more food right after you eat the applesauce containing the crushed tablet. Talk to your pediatrician regarding the use of this medicine in children. Special care may be needed. What if I miss a dose? If you take your medicine once a day and miss a dose, take the missed dose as soon as you remember. If you take your medicine twice a day and miss a dose, take the missed dose immediately. In this instance, 2 tablets may be taken at the same time. The next day you should take 1 tablet twice a day as directed. What may interact with this medicine? Do not take this medicine with any of the following medications: -defibrotideThis medicine may also interact with the following medications: -aspirin and aspirin-like medicines -certain antibiotics like erythromycin, azithromycin, and clarithromycin -certain medicines for fungal infections like ketoconazole and itraconazole -certain medicines for irregular heart beat like amiodarone, quinidine, dronedarone -certain medicines for seizures like carbamazepine, phenytoin -certain medicines that treat or prevent blood clots like warfarin, enoxaparin, and dalteparin -conivaptan -diltiazem -felodipine -indinavir -lopinavir; ritonavir -NSAIDS, medicines for pain and inflammation, like ibuprofen or naproxen -ranolazine -rifampin -ritonavir -SNRIs, medicines for depression, like desvenlafaxine, duloxetine, levomilnacipran, venlafaxine -SSRIs, medicines for depression, like citalopram, escitalopram, fluoxetine, fluvoxamine, paroxetine, sertraline -St. John's wort -verapamil What should I watch for while using this medicine? Visit your doctor or health care professional for regular checks on your progress. Your condition will be monitored carefully while you are  receiving this medicine. Notify your doctor or health care professional and seek emergency treatment if you develop breathing problems; changes in vision; chest pain; severe, sudden headache; pain, swelling, warmth in the leg; trouble speaking; sudden numbness or weakness of the face, arm, or leg. These can be signs that  your condition has gotten worse. If you are going to have surgery, tell your doctor or health care professional that you are taking this medicine. Tell your health care professional that you use this medicine before you have a spinal or epidural procedure. Sometimes people who take this medicine have bleeding problems around the spine when they have a spinal or epidural procedure. This bleeding is very rare. If you have a spinal or epidural procedure while on this medicine, call your health care professional immediately if you have back pain, numbness or tingling (especially in your legs and feet), muscle weakness, paralysis, or loss of bladder or bowel control. Avoid sports and activities that might cause injury while you are using this medicine. Severe falls or injuries can cause unseen bleeding. Be careful when using sharp tools or knives. Consider using an Copy. Take special care brushing or flossing your teeth. Report any injuries, bruising, or red spots on the skin to your doctor or health care professional. What side effects may I notice from receiving this medicine? Side effects that you should report to your doctor or health care professional as soon as possible: -allergic reactions like skin rash, itching or hives, swelling of the face, lips, or tongue -back pain -redness, blistering, peeling or loosening of the skin, including inside the mouth -signs and symptoms of bleeding such as bloody or black, tarry stools; red or dark-brown urine; spitting up blood or brown material that looks like coffee grounds; red spots on the skin; unusual bruising or bleeding from the eye,  gums, or nose Side effects that usually do not require medical attention (report to your doctor or health care professional if they continue or are bothersome): -dizziness -muscle pain Where should I keep my medicine? Keep out of the reach of children. Store at room temperature between 15 and 30 degrees C (59 and 86 degrees F). Throw away any unused medicine after the expiration date.

## 2016-09-07 NOTE — Progress Notes (Signed)
STROKE TEAM PROGRESS NOTE   HISTORY OF PRESENT ILLNESS (per record) Raven Gray is an 82 y.o. female with a past medical history that is pertinent for HTN, HLD, atrial fibrillation on xarelto, DVT, and depression, brought in by EMS due to acute onset of dysarthria, blurred vision, and transient unresponsiveness. Patient said that she had " a similar but less intense episode" a week ago. She said that she was at church when she suddenly developed blurred vision and slurred speech, and was unresponsive with snoring respirations for about 5 minutes. Patient states she still has some blurred vision but otherwise denies HA, vertigo, double vision, focal weakness, or visual disturbances./ NIHSS 0. CT head without acute abnormality, ASPECTS 10   Date last known well: 09/06/16 Time last known well: 1115 AM tPA Given: no, has no deficits on exam NIHSS: 0 MRS: 0  SUBJECTIVE (INTERVAL HISTORY) Two daughters at the bedside. The patient reported that she was temporarily blind during the episode that led to this admission and also had slurred speech and transient unresponsiveness; however, her symptoms completely resolved within 10-15 minutes. Her daughters were with her at the time of the incident and called 911. She is on Xarelto for atrial fibrillation and admits that she does not always take the medication with food or at the same time each day. The patient and her daughters were instructed in the proper administration of Xarelto.   OBJECTIVE Temp:  [97.6 F (36.4 C)-100.8 F (38.2 C)] 98.1 F (36.7 C) (12/17 0950) Pulse Rate:  [49-73] 67 (12/17 0950) Cardiac Rhythm: Atrial fibrillation (12/17 0845) Resp:  [16-18] 18 (12/17 0950) BP: (125-161)/(52-86) 125/52 (12/17 0950) SpO2:  [95 %-98 %] 98 % (12/17 0950) Weight:  [85 kg (187 lb 4.8 oz)] 85 kg (187 lb 4.8 oz) (12/16 1622)  CBC:   Recent Labs Lab 09/06/16 1210 09/06/16 1216  WBC 6.8  --   NEUTROABS 3.9  --   HGB 11.8* 12.6  HCT 38.0  37.0  MCV 86.8  --   PLT 253  --     Basic Metabolic Panel:   Recent Labs Lab 09/06/16 1210 09/06/16 1216  NA 141 142  K 4.0 4.0  CL 107 102  CO2 29  --   GLUCOSE 71 67  BUN 19 23*  CREATININE 1.09* 1.00  CALCIUM 9.0  --     Lipid Panel:     Component Value Date/Time   CHOL 106 09/07/2016 0258   TRIG 56 09/07/2016 0258   HDL 43 09/07/2016 0258   CHOLHDL 2.5 09/07/2016 0258   VLDL 11 09/07/2016 0258   LDLCALC 52 09/07/2016 0258   HgbA1c:  Lab Results  Component Value Date   HGBA1C 6.4 (H) 10/20/2014   Urine Drug Screen: No results found for: LABOPIA, COCAINSCRNUR, LABBENZ, AMPHETMU, THCU, LABBARB    IMAGING  Mr Raven Gray Head/brain Wo Cm 09/06/2016 No acute finding by MRI. Atrophy and extensive old ischemic changes throughout the brain as outlined above. Intracranial MR angiography does not show any large vessel occlusion. 50% stenosis of the left distal M1. This could place the patient at risk of left MCA territory infarction. Distal vessel atherosclerotic irregularity diffusely.     Ct Head Code Stroke W/o Cm 09/06/2016 1. No acute finding. Extensive chronic small-vessel ischemic changes as outlined above.  2. ASPECTS is 10     PHYSICAL EXAM Constitutional: well developed, pleasant elderly Caucasian lady in no apparent distress. Blood pressure 149/81, pulse 69, temperature 97.3 F (36.3 C),  temperature source Oral, resp. rate 19, weight 90.1 kg (198 lb 10.2 oz), SpO2 96 %. Eyes: no jaundice or exophthalmos.  Head: normocephalic. Neck: supple, no bruits, no JVD. Cardiac: no murmurs. Lungs: clear. Abdomen: soft, no tender, no mass. Extremities: no edema, clubbing, or cyanosis.  Skin: no rash  Neurologic Examination:                                                                                                      General: NAD Mental Status: Alert, oriented, thought content appropriate.  Speech fluent without evidence of aphasia.  Able to follow 3 step  commands without difficulty. Cranial Nerves: II:  Visual fields grossly normal, pupils equal, round, reactive to light and accommodation III,IV, VI: ptosis not present, extra-ocular motions intact bilaterally V,VII: smile symmetric, facial light touch sensation normal bilaterally VIII: hearing normal bilaterally IX,X: uvula rises symmetrically XI: bilateral shoulder shrug XII: midline tongue extension without atrophy or fasciculations  Motor: Right :  Upper extremity   5/5                                      Left:     Upper extremity   5/5             Lower extremity   5/5                                                  Lower extremity   5/5 Tone and bulk:normal tone throughout; no atrophy noted Sensory: Pinprick and light touch intact throughout, bilaterally Deep Tendon Reflexes:  Right: Upper Extremity                       Left: Upper extremity   biceps (C-5 to C-6) 2/4                       biceps (C-5 to C-6) 2/4 tricep (C7) 2/4                                     triceps (C7) 2/4 Brachioradialis (C6) 2/4                      Brachioradialis (C6) 2/4  Lower Extremity Lower Extremity  quadriceps (L-2 to L-4) 2/4                 quadriceps (L-2 to L-4) 2/4 Achilles (S1) 2/4                                  Achilles (S1) 2/4  Plantars: Right: downgoing  Left: downgoing Cerebellar: normal finger-to-nose,  normal heel-to-shin test Gait:  No tested due to multiple leads    ASSESSMENT/PLAN Ms. Raven Gray is a 82 y.o. female with history of dyspnea, hypertension, hyperlipidemia, pulmonary embolism, DVT, COPD, aortic insufficiency, and atrial fibrillation presenting with transient blindness, dysarthria, and unresponsiveness. She did not receive IV t-PA due to resolution of deficits.  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  MRA - 50% stenosis of the left distal M1.   Carotid Doppler - 1-39% ICA plaquing. Vertebral artery flow is antegrade.  2D Echo - EF 60-65%. No cardiac source of emboli identified.  EEG - pending  LDL - 52  HgbA1c pending  VTE prophylaxis - Xarelto Diet Heart Room service appropriate? Yes; Fluid consistency: Thin Diet - low sodium heart healthy  Xarelto (rivaroxaban) daily prior to admission, now on Xarelto (rivaroxaban) daily  Patient counseled to be compliant with her antithrombotic medications  Ongoing aggressive stroke risk factor management  Therapy recommendations: No follow-up physical or occupational therapies recommended.  Disposition: Pending  Hypertension  Stable  BP goal normotensive  Hyperlipidemia  Home meds: Pravachol 40 mg daily resumed in hospital  LDL 52, goal < 70  Continue statin at discharge   Other Stroke Risk Factors  Advanced age  Obesity, Body mass index is 33.18 kg/m., recommend weight loss, diet and exercise as appropriate   Hx stroke/TIA (recent similar episode)  Atrial fibrillation  Other Active Problems  The patient was instructed to take her Xarelto with food and at the same approximate time each day.  Hospital day # 0  Mikey Bussing PA-C [facility] Pager [phone] 09/07/2016, 2:52 PM I have personally examined this patient, reviewed notes, independently viewed imaging studies, participated in medical decision making and plan of care.ROS completed by me personally and pertinent positives fully documented  I have made any additions or clarifications directly to the above note. Agree with note above. She presented with transient bilateral vision loss, slurred speech and loss of consciousness likely do to top of the basilar syndrome from embolism from underlying atrial fibrillation. She has not been very consistent with the  timing of taking her xarelto and more ever she often does not take it with her proper meal. She was instructed to do so going forward and voiced understanding. Greater than 50% time during this 35 minute visit was spent on counseling and coordination of care about atrial fibrillation, anticoagulation treatment, stroke prevention. Discussed with patient,, 2 daughters and Dr.Ghimire.  Antony Contras, MD Medical Director [facility] Pager: [phone] 09/07/2016 4:13 PM   To contact Stroke Continuity provider, please refer to [email or web address] After hours, contact General Neurology

## 2016-09-07 NOTE — Discharge Summary (Signed)
PATIENT DETAILS Name: Raven Gray Age: 82 y.o. Sex: female Date of Birth: [birth year] MRN: [id number]. Admitting Physician: Maren Reamer, MD [id number] DENNIS, MD  Admit Date: 09/06/2016 Discharge date: 09/07/2016  Recommendations for Outpatient Follow-up:  1. Follow up with PCP in 1-2 weeks 2. Please arrange for outpatient EEG 3. Ensure follow-up with neurology. 4. Please obtain BMP/CBC in one week 5. A1c pending-please follow  Admitted From:  Home  Disposition: [place]: No  Equipment/Devices: None  Discharge Condition: Stable  CODE STATUS: FULL CODE  Diet recommendation:  Heart Healthy  Brief Summary: See H&P, Labs, Consult and Test reports for all details in brief, patient is a 82 year old female with past medical history of atrial fibrillation on Xarelto admitted with transient blurred vision, and dysarthria. She was subsequently admitted for further evaluation and treatment.   Brief Hospital Course: Probable TIA: Suspect posterior circulation TIA. MRI brain negative for CVA. 2-D echocardiogram shows preserved EF without any embolic foci. Carotid Doppler negative for significant stenosis. LDL at 52 and at goal-continue statin. A1c pending at the time of discharge. Spoke with neurologist-Dr. Marquette Old further recommendations-okay to discharge. Okay to pursue EEG in the outpatient setting-as low suspicion for seizures. Note, no focal neurologic deficits at the time of discharge. Speech is completely clear.   Atrial fibrillation: Rate controlled, anticoagulated with Xarelto-after discussing with pharmacy-recommendations are to decrease dosage to 15 mg daily.  Rest of her medical problems were stable during this short hospital stay.  Note-above plan was discussed with the patient's 2 daughters at bedside  Procedures/Studies: None  Discharge Diagnoses:  Principal Problem:   TIA (transient ischemic attack) Active Problems:   Hyperlipidemia   COPD (chronic obstructive pulmonary disease) (HCC)   HTN (hypertension)   GERD (gastroesophageal reflux disease)   Depression   Discharge Instructions:  Activity:  As tolerated with Full fall precautions use walker/cane & assistance as needed   Discharge Instructions    Ambulatory referral to Neurology    Complete by:  As directed    Diet - low sodium heart healthy    Complete by:  As directed    Discharge instructions    Complete by:  As directed    Follow with Primary MD  [name], MD  and Dr [name] (Neurologist)  Please ask your primary M.D. to arrange for a outpatient EEG  Please get a complete blood count and chemistry panel checked by your Primary MD at your next visit, and again as instructed by your Primary MD.  Get Medicines reviewed and adjusted: Please take all your medications with you for your next visit with your Primary MD  Laboratory/radiological data: Please request your Primary MD to go over all hospital tests and procedure/radiological results at the follow up, please ask your Primary MD to get all Hospital records sent to his/her office.  In some cases, they will be blood work, cultures and biopsy results pending at the time of your discharge. Please request that your primary care M.D. follows up on these results.  Also Note the following: If you experience worsening of your admission symptoms, develop shortness of breath, life threatening emergency, suicidal or homicidal thoughts you must seek medical attention immediately by calling 911 or calling your MD immediately  if symptoms less severe.  You must read complete instructions/literature along with all the possible adverse reactions/side effects for all the Medicines you take and that have been prescribed to you. Take any new Medicines after you have completely  understood and accpet all the possible adverse reactions/side effects.   Do not drive when taking Pain medications or sleeping  medications (Benzodaizepines)  Do not take more than prescribed Pain, Sleep and Anxiety Medications. It is not advisable to combine anxiety,sleep and pain medications without talking with your primary care practitioner  Special Instructions: If you have smoked or chewed Tobacco  in the last 2 yrs please stop smoking, stop any regular Alcohol  and or any Recreational drug use.  Wear Seat belts while driving.  Please note: You were cared for by a hospitalist during your hospital stay. Once you are discharged, your primary care physician will handle any further medical issues. Please note that NO REFILLS for any discharge medications will be authorized once you are discharged, as it is imperative that you return to your primary care physician (or establish a relationship with a primary care physician if you do not have one) for your post hospital discharge needs so that they can reassess your need for medications and monitor your lab values.   Increase activity slowly    Complete by:  As directed      Allergies as of 09/07/2016   No Known Allergies     Medication List    STOP taking these medications   ibuprofen 200 MG tablet Commonly known as:  ADVIL,MOTRIN     TAKE these medications   ALPRAZolam 0.25 MG tablet Commonly known as:  XANAX Take 1 tablet (0.25 mg total) by mouth 2 (two) times daily as needed for anxiety.   furosemide 40 MG tablet Commonly known as:  LASIX Take 2 tablets by mouth X's 2 days then go back to 1 1/2 tablet by mouth daily What changed:  how much to take  how to take this  when to take this  additional instructions   isosorbide mononitrate 60 MG 24 hr tablet Commonly known as:  IMDUR Take 1 tablet (60 mg total) by mouth daily.   metoprolol tartrate 25 MG tablet Commonly known as:  LOPRESSOR Take 1 tablet (25 mg total) by mouth 2 (two) times daily.   nitroGLYCERIN 0.4 MG SL tablet Commonly known as:  NITROSTAT Place 0.4 mg under the tongue as  needed for chest pain. X 3 doses   omeprazole 20 MG capsule Commonly known as:  PRILOSEC Take 20 mg by mouth daily as needed (for reflux/heartburn).   oxybutynin 5 MG 24 hr tablet Commonly known as:  DITROPAN-XL Take 5 mg by mouth daily.   potassium chloride SA 20 MEQ tablet Commonly known as:  K-DUR,KLOR-CON Take 60 mEq by mouth every morning.   pravastatin 40 MG tablet Commonly known as:  PRAVACHOL Take 40 mg by mouth every morning.   Rivaroxaban 15 MG Tabs tablet Commonly known as:  XARELTO Take 1 tablet (15 mg total) by mouth every morning. Start taking on:  09/08/2016 What changed:  medication strength  how much to take   tiotropium 18 MCG inhalation capsule Commonly known as:  SPIRIVA Place 18 mcg into inhaler and inhale daily.   venlafaxine XR 150 MG 24 hr capsule Commonly known as:  EFFEXOR-XR Take 150 mg by mouth daily.   WOMENS 50+ MULTI VITAMIN/MIN Tabs Take 1 tablet by mouth daily.   zolpidem 5 MG tablet Commonly known as:  AMBIEN Take 5 mg by mouth at bedtime.      Follow-up Information    [name], MD. Schedule an appointment as soon as possible for a visit in 2 week(s).  Specialty:  Endocrinology Contact information: [address] [place]  [address] [phone]        Antony Contras, MD. Go in 2 month(s).   Specialties:  Neurology, Radiology Why:  Office will call you with a appointment, if you do not hear from them-please give them a call Contact information: [place] [address] [phone]          No Known Allergies  Consultations:   neurology   Other Procedures/Studies: Mr Brain Wo Contrast  Result Date: 09/06/2016 CLINICAL DATA:  Acute onset of blurred vision and vertigo. EXAM: MRI HEAD WITHOUT CONTRAST MRA HEAD WITHOUT CONTRAST TECHNIQUE: Multiplanar, multiecho pulse sequences of the brain and surrounding structures were obtained without intravenous contrast.  Angiographic images of the head were obtained using MRA technique without contrast. COMPARISON:  Head CT same day.  MRI 10/21/2014. FINDINGS: MRI HEAD FINDINGS Brain: Diffusion imaging does not show any acute or subacute infarction. There are chronic small-vessel ischemic changes affecting the pons. There are a few old small vessel cerebellar infarctions. There is old infarction affecting the left occipital cortical and subcortical brain. There chronic small-vessel ischemic changes affecting the white matter of both hemispheres. Old small vessel infarctions affect the thalami and basal ganglia. No mass lesion, hemorrhage, hydrocephalus or extra-axial collection. Vascular: Major vessels at the base of the brain show flow. Skull and upper cervical spine: Negative Sinuses/Orbits: Clear/normal Other: None significant MRA HEAD FINDINGS Both internal carotid arteries are widely patent through the skullbase. No siphon stenosis. The anterior and middle cerebral vessels are patent. The right ICA supplies the right middle cerebral artery territory only. No proximal stenosis. The left ICA supplies both the anterior cerebral artery territories an the left MCA territory. There is focal stenosis at the distal M1 segment on the left estimated at 50%. Both vertebral arteries are patent with the left being dominant. No basilar stenosis. Posterior circulation branch vessels are patent. More distal intracranial vessels show atherosclerotic narrowing and irregularity. IMPRESSION: No acute finding by MRI. Atrophy and extensive old ischemic changes throughout the brain as outlined above. Intracranial MR angiography does not show any large vessel occlusion. 50% stenosis of the left distal M1. This could place the patient at risk of left MCA territory infarction. Distal vessel atherosclerotic irregularity diffusely. Electronically Signed   By: Nelson Chimes M.D.   On: 09/06/2016 15:48   Mr Jodene Nam Head/brain [id number] Cm  Result Date:  09/06/2016 CLINICAL DATA:  Acute onset of blurred vision and vertigo. EXAM: MRI HEAD WITHOUT CONTRAST MRA HEAD WITHOUT CONTRAST TECHNIQUE: Multiplanar, multiecho pulse sequences of the brain and surrounding structures were obtained without intravenous contrast. Angiographic images of the head were obtained using MRA technique without contrast. COMPARISON:  Head CT same day.  MRI 10/21/2014. FINDINGS: MRI HEAD FINDINGS Brain: Diffusion imaging does not show any acute or subacute infarction. There are chronic small-vessel ischemic changes affecting the pons. There are a few old small vessel cerebellar infarctions. There is old infarction affecting the left occipital cortical and subcortical brain. There chronic small-vessel ischemic changes affecting the white matter of both hemispheres. Old small vessel infarctions affect the thalami and basal ganglia. No mass lesion, hemorrhage, hydrocephalus or extra-axial collection. Vascular: Major vessels at the base of the brain show flow. Skull and upper cervical spine: Negative Sinuses/Orbits: Clear/normal Other: None significant MRA HEAD FINDINGS Both internal carotid arteries are widely patent through the skullbase. No siphon stenosis. The anterior and middle cerebral vessels are patent. The right  ICA supplies the right middle cerebral artery territory only. No proximal stenosis. The left ICA supplies both the anterior cerebral artery territories an the left MCA territory. There is focal stenosis at the distal M1 segment on the left estimated at 50%. Both vertebral arteries are patent with the left being dominant. No basilar stenosis. Posterior circulation branch vessels are patent. More distal intracranial vessels show atherosclerotic narrowing and irregularity. IMPRESSION: No acute finding by MRI. Atrophy and extensive old ischemic changes throughout the brain as outlined above. Intracranial MR angiography does not show any large vessel occlusion. 50% stenosis of the  left distal M1. This could place the patient at risk of left MCA territory infarction. Distal vessel atherosclerotic irregularity diffusely. Electronically Signed   By: Nelson Chimes M.D.   On: 09/06/2016 15:48   Ct Head Code Stroke W/o Cm  Result Date: 09/06/2016 CLINICAL DATA:  Code stroke. Left-sided weakness. Blurred vision. Slurred speech. Acute onset 1 hour ago. EXAM: CT HEAD WITHOUT CONTRAST TECHNIQUE: Contiguous axial images were obtained from the base of the skull through the vertex without intravenous contrast. COMPARISON:  MRI 10/21/2014 FINDINGS: Brain: Chronic generalized brain atrophy. Chronic small-vessel ischemic changes affecting the hemispheric white matter. 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 Old lacunar infarctions right thalamus. No sign of acute infarction, mass lesion, hemorrhage, hydrocephalus or extra-axial collection. Vascular: There is atherosclerotic calcification of the major vessels at the base of the brain. Skull: Negative Sinuses/Orbits: Clear/normal Other: None significant ASPECTS ([place] Stroke Program Early CT Score) - Ganglionic level infarction (caudate, lentiform nuclei, internal capsule, insula, M1-M3 cortex): 7 - Supraganglionic infarction (M4-M6 cortex): 3 Total score (0-10 with 10 being normal): 10 IMPRESSION: 1. No acute finding. Extensive chronic small-vessel ischemic changes as outlined above. 2. ASPECTS is 10 These results were called by telephone at the time of interpretation on 09/06/2016 at 12:24 pm to Dr. Armida Sans, who verbally acknowledged these results. Electronically Signed   By: Nelson Chimes M.D.   On: 09/06/2016 12:26     TODAY-DAY OF DISCHARGE:  Subjective:   [facility] today has no headache,no chest abdominal pain,no new weakness tingling or numbness, feels much better wants to go home today.   Objective:   Blood pressure (!) 125/52, pulse 67, temperature 98.1 F (36.7 C), temperature source Oral, resp. rate 18, height 5\' 3"  (1.6 m), weight  85 kg (187 lb 4.8 oz), SpO2 98 %. No intake or output data in the 24 hours ending 09/07/16 1431 Filed Weights   09/06/16 1235 09/06/16 1622  Weight: 90.1 kg (198 lb 10.2 oz) 85 kg (187 lb 4.8 oz)    Exam: Awake Alert, Oriented *3, No new F.N deficits, Normal affect [place].AT,PERRAL Supple Neck,No JVD, No cervical lymphadenopathy appriciated.  Symmetrical Chest wall movement, Good air movement bilaterally, CTAB RRR,No Gallops,Rubs or new Murmurs, No Parasternal Heave +ve B.Sounds, Abd Soft, Non tender, No organomegaly appriciated, No rebound -guarding or rigidity. No Cyanosis, Clubbing or edema, No new Rash or bruise   PERTINENT RADIOLOGIC STUDIES: Mr Brain Wo Contrast  Result Date: 09/06/2016 CLINICAL DATA:  Acute onset of blurred vision and vertigo. EXAM: MRI HEAD WITHOUT CONTRAST MRA HEAD WITHOUT CONTRAST TECHNIQUE: Multiplanar, multiecho pulse sequences of the brain and surrounding structures were obtained without intravenous contrast. Angiographic images of the head were obtained using MRA technique without contrast. COMPARISON:  Head CT same day.  MRI 10/21/2014. FINDINGS: MRI HEAD FINDINGS Brain: Diffusion imaging does not show any acute or subacute infarction. There are chronic small-vessel ischemic changes affecting  the pons. There are a few old small vessel cerebellar infarctions. There is old infarction affecting the left occipital cortical and subcortical brain. There chronic small-vessel ischemic changes affecting the white matter of both hemispheres. Old small vessel infarctions affect the thalami and basal ganglia. No mass lesion, hemorrhage, hydrocephalus or extra-axial collection. Vascular: Major vessels at the base of the brain show flow. Skull and upper cervical spine: Negative Sinuses/Orbits: Clear/normal Other: None significant MRA HEAD FINDINGS Both internal carotid arteries are widely patent through the skullbase. No siphon stenosis. The anterior and middle cerebral vessels are  patent. The right ICA supplies the right middle cerebral artery territory only. No proximal stenosis. The left ICA supplies both the anterior cerebral artery territories an the left MCA territory. There is focal stenosis at the distal M1 segment on the left estimated at 50%. Both vertebral arteries are patent with the left being dominant. No basilar stenosis. Posterior circulation branch vessels are patent. More distal intracranial vessels show atherosclerotic narrowing and irregularity. IMPRESSION: No acute finding by MRI. Atrophy and extensive old ischemic changes throughout the brain as outlined above. Intracranial MR angiography does not show any large vessel occlusion. 50% stenosis of the left distal M1. This could place the patient at risk of left MCA territory infarction. Distal vessel atherosclerotic irregularity diffusely. Electronically Signed   By: Nelson Chimes M.D.   On: 09/06/2016 15:48   Mr Jodene Nam Head/brain [id number] Cm  Result Date: 09/06/2016 CLINICAL DATA:  Acute onset of blurred vision and vertigo. EXAM: MRI HEAD WITHOUT CONTRAST MRA HEAD WITHOUT CONTRAST TECHNIQUE: Multiplanar, multiecho pulse sequences of the brain and surrounding structures were obtained without intravenous contrast. Angiographic images of the head were obtained using MRA technique without contrast. COMPARISON:  Head CT same day.  MRI 10/21/2014. FINDINGS: MRI HEAD FINDINGS Brain: Diffusion imaging does not show any acute or subacute infarction. There are chronic small-vessel ischemic changes affecting the pons. There are a few old small vessel cerebellar infarctions. There is old infarction affecting the left occipital cortical and subcortical brain. There chronic small-vessel ischemic changes affecting the white matter of both hemispheres. Old small vessel infarctions affect the thalami and basal ganglia. No mass lesion, hemorrhage, hydrocephalus or extra-axial collection. Vascular: Major vessels at the base of the brain show  flow. Skull and upper cervical spine: Negative Sinuses/Orbits: Clear/normal Other: None significant MRA HEAD FINDINGS Both internal carotid arteries are widely patent through the skullbase. No siphon stenosis. The anterior and middle cerebral vessels are patent. The right ICA supplies the right middle cerebral artery territory only. No proximal stenosis. The left ICA supplies both the anterior cerebral artery territories an the left MCA territory. There is focal stenosis at the distal M1 segment on the left estimated at 50%. Both vertebral arteries are patent with the left being dominant. No basilar stenosis. Posterior circulation branch vessels are patent. More distal intracranial vessels show atherosclerotic narrowing and irregularity. IMPRESSION: No acute finding by MRI. Atrophy and extensive old ischemic changes throughout the brain as outlined above. Intracranial MR angiography does not show any large vessel occlusion. 50% stenosis of the left distal M1. This could place the patient at risk of left MCA territory infarction. Distal vessel atherosclerotic irregularity diffusely. Electronically Signed   By: Nelson Chimes M.D.   On: 09/06/2016 15:48   Ct Head Code Stroke W/o Cm  Result Date: 09/06/2016 CLINICAL DATA:  Code stroke. Left-sided weakness. Blurred vision. Slurred speech. Acute onset 1 hour ago. EXAM: CT HEAD WITHOUT CONTRAST TECHNIQUE: Contiguous  axial images were obtained from the base of the skull through the vertex without intravenous contrast. COMPARISON:  MRI 10/21/2014 FINDINGS: Brain: Chronic generalized brain atrophy. Chronic small-vessel ischemic changes affecting the hemispheric white matter. 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 Old lacunar infarctions right thalamus. No sign of acute infarction, mass lesion, hemorrhage, hydrocephalus or extra-axial collection. Vascular: There is atherosclerotic calcification of the major vessels at the base of the brain. Skull: Negative Sinuses/Orbits:  Clear/normal Other: None significant ASPECTS ([place] Stroke Program Early CT Score) - Ganglionic level infarction (caudate, lentiform nuclei, internal capsule, insula, M1-M3 cortex): 7 - Supraganglionic infarction (M4-M6 cortex): 3 Total score (0-10 with 10 being normal): 10 IMPRESSION: 1. No acute finding. Extensive chronic small-vessel ischemic changes as outlined above. 2. ASPECTS is 10 These results were called by telephone at the time of interpretation on 09/06/2016 at 12:24 pm to Dr. Armida Sans, who verbally acknowledged these results. Electronically Signed   By: Nelson Chimes M.D.   On: 09/06/2016 12:26     PERTINENT LAB RESULTS: CBC:  Recent Labs  09/06/16 1210 09/06/16 1216  WBC 6.8  --   HGB 11.8* 12.6  HCT 38.0 37.0  PLT 253  --    CMET CMP     Component Value Date/Time   NA 142 09/06/2016 1216   K 4.0 09/06/2016 1216   CL 102 09/06/2016 1216   CO2 29 09/06/2016 1210   GLUCOSE 67 09/06/2016 1216   BUN 23 (H) 09/06/2016 1216   CREATININE 1.00 09/06/2016 1216   CREATININE 1.12 (H) 03/31/2016 1130   CALCIUM 9.0 09/06/2016 1210   PROT 6.5 09/06/2016 1210   ALBUMIN 3.6 09/06/2016 1210   AST 20 09/06/2016 1210   ALT 11 (L) 09/06/2016 1210   ALKPHOS 89 09/06/2016 1210   BILITOT 0.8 09/06/2016 1210   GFRNONAA 45 (L) 09/06/2016 1210   GFRNONAA 42 (L) 02/06/2015 1619   GFRAA 52 (L) 09/06/2016 1210   GFRAA 49 (L) 02/06/2015 1619    GFR Estimated Creatinine Clearance: 41.7 mL/min (by C-G formula based on SCr of 1 mg/dL). No results for input(s): LIPASE, AMYLASE in the last 72 hours. No results for input(s): CKTOTAL, CKMB, CKMBINDEX, TROPONINI in the last 72 hours. Invalid input(s): POCBNP No results for input(s): DDIMER in the last 72 hours. No results for input(s): HGBA1C in the last 72 hours.  Recent Labs  09/07/16 0258  CHOL 106  HDL 43  LDLCALC 52  TRIG 56  CHOLHDL 2.5   No results for input(s): TSH, T4TOTAL, T3FREE, THYROIDAB in the last 72 hours.  Invalid  input(s): FREET3 No results for input(s): VITAMINB12, FOLATE, FERRITIN, TIBC, IRON, RETICCTPCT in the last 72 hours. Coags:  Recent Labs  09/06/16 1210  INR 2.06   Microbiology: No results found for this or any previous visit (from the past 240 hour(s)).  FURTHER DISCHARGE INSTRUCTIONS:  Get Medicines reviewed and adjusted: Please take all your medications with you for your next visit with your Primary MD  Laboratory/radiological data: Please request your Primary MD to go over all hospital tests and procedure/radiological results at the follow up, please ask your Primary MD to get all Hospital records sent to his/her office.  In some cases, they will be blood work, cultures and biopsy results pending at the time of your discharge. Please request that your primary care M.D. goes through all the records of your hospital data and follows up on these results.  Also Note the following: If you experience worsening of your admission symptoms,  develop shortness of breath, life threatening emergency, suicidal or homicidal thoughts you must seek medical attention immediately by calling 911 or calling your MD immediately  if symptoms less severe.  You must read complete instructions/literature along with all the possible adverse reactions/side effects for all the Medicines you take and that have been prescribed to you. Take any new Medicines after you have completely understood and accpet all the possible adverse reactions/side effects.   Do not drive when taking Pain medications or sleeping medications (Benzodaizepines)  Do not take more than prescribed Pain, Sleep and Anxiety Medications. It is not advisable to combine anxiety,sleep and pain medications without talking with your primary care practitioner  Special Instructions: If you have smoked or chewed Tobacco  in the last 2 yrs please stop smoking, stop any regular Alcohol  and or any Recreational drug use.  Wear Seat belts while  driving.  Please note: You were cared for by a hospitalist during your hospital stay. Once you are discharged, your primary care physician will handle any further medical issues. Please note that NO REFILLS for any discharge medications will be authorized once you are discharged, as it is imperative that you return to your primary care physician (or establish a relationship with a primary care physician if you do not have one) for your post hospital discharge needs so that they can reassess your need for medications and monitor your lab values.  Total Time spent coordinating discharge including counseling, education and face to face time equals 25 minutes.  SignedOren Binet 09/07/2016 2:31 PM

## 2016-09-07 NOTE — Progress Notes (Signed)
Patient's Xarelto dose was decreased to 15mg  because her CrCl <9mL/min. Dose decrease discussed with Dr. Sloan Leiter, who agreed with plan.

## 2016-09-07 NOTE — Progress Notes (Signed)
Patient given discharge instructions, and education regarding new medication regimen.  All questions and concerns addressed. IV catheter removed, telemetry removed. Patient alert, verbal with no complaints.  Family members at side, and providing private transportation.

## 2016-09-07 NOTE — Progress Notes (Signed)
VASCULAR LAB PRELIMINARY  PRELIMINARY  PRELIMINARY  PRELIMINARY  Carotid duplex completed.    Preliminary report:  1-39% ICA plaquing. Vertebral artery flow is antegrade.   Jarad Barth, RVT 09/07/2016, 10:32 AM

## 2016-09-07 NOTE — Progress Notes (Addendum)
Occupational Therapy Evaluation Patient Details Name: Raven Gray MRN: [id number] DOB: 24-Jun-1928 Today's Date: 09/07/2016    History of Present Illness Patient is an 82 yo female admitted 09/06/16 with changes in speech and vision.  CVA/TIA workup.  MRI negative for acute stroke.    PMH:  Afib, depression, HTN, HLD, COPD, DVT/PE   Clinical Impression   Pt appears to be functioning @ baseline and is safe to DC home with intermittent S. Educated on reducing risk of falls at home and the signs/symptoms of a CVA using "BeFast". Pt verbalized understanding. OT signing off.     Follow Up Recommendations  No OT follow up;Supervision - Intermittent    Equipment Recommendations  None recommended by OT    Recommendations for Other Services       Precautions / Restrictions Precautions Precautions: None Restrictions Weight Bearing Restrictions: No      Mobility Bed Mobility Overal bed mobility: Modified Independent                Transfers Overall transfer level: Modified independent                    Balance Overall balance assessment: Needs assistance;History of Falls                                          ADL Overall ADL's : At baseline  Pt states she only showers when her daughters are with her.        Recommend pt use her 3 in 1 as a shower seat. Pt verbalized understanding.  Pt has grab bars in shower                                     Vision Vision Assessment?: Yes Eye Alignment: Within Functional Limits Ocular Range of Motion: Within Functional Limits Alignment/Gaze Preference: Within Defined Limits Tracking/Visual Pursuits: Able to track stimulus in all quads without difficulty Saccades: Within functional limits Convergence: Within functional limits Visual Fields: No apparent deficits Additional Comments: Pt states vision has improved and seems "back to normal"   Perception     Praxis      Pertinent  Vitals/Pain Pain Assessment: No/denies pain     Hand Dominance Right   Extremity/Trunk Assessment Upper Extremity Assessment Upper Extremity Assessment: Generalized weakness (R RTC insufficiency baseline. )  C/O LUE numbness yesterday but that has returned to normal   Lower Extremity Assessment Lower Extremity Assessment: Defer to PT evaluation   Cervical / Trunk Assessment Cervical / Trunk Assessment: Normal   Communication Communication Communication: No difficulties   Cognition Arousal/Alertness: Awake/alert Behavior During Therapy: WFL for tasks assessed/performed Overall Cognitive Status: No family/caregiver present to determine baseline cognitive functioning                 General Comments: Appears to have difficulty with STM. Most likely baseline.   General Comments       Exercises       Shoulder Instructions      Home Living Family/patient expects to be discharged to:: Private residence Living Arrangements: Alone Available Help at Discharge: Family;Available 24 hours/day Type of Home: House Home Access: Ramped entrance     Home Layout: One level     Bathroom Shower/Tub: Tub/shower unit Shower/tub characteristics: Architectural technologist: Standard  Bathroom Accessibility: Yes How Accessible: Accessible via walker Home Equipment: [place] - 2 wheels;Wheelchair - manual;Cane - single point;Bedside commode; grab bars in shower          Prior Functioning/Environment Level of Independence: Independent with assistive device(s)        Comments: Using RW for ambulation.  Takes "pan baths".  Stopped driving recently. Daughter cleans house. Has Meals on Wheels.        OT Problem List: Decreased strength;Decreased activity tolerance;Impaired vision/perception   OT Treatment/Interventions:      OT Goals(Current goals can be found in the care plan section) Acute Rehab OT Goals Patient Stated Goal: to be independent OT Goal Formulation: All  assessment and education complete, DC therapy  OT Frequency:     Barriers to D/C:            Co-evaluation              End of Session Nurse Communication: Mobility status  Activity Tolerance: Patient tolerated treatment well Patient left: in chair;with call bell/phone within reach   Time: 0805-0821 OT Time Calculation (min): 16 min Charges:  OT General Charges $OT Visit: 1 Procedure OT Evaluation $OT Eval Low Complexity: 1 Procedure G-Codes: OT G-codes **NOT FOR INPATIENT CLASS** Functional Assessment Tool Used: clinical judgement Functional Limitation: Self care Self Care Current Status [id number]): At least 1 percent but less than 20 percent impaired, limited or restricted Self Care Goal Status [id number]): At least 1 percent but less than 20 percent impaired, limited or restricted Self Care Discharge Status [phone]): At least 1 percent but less than 20 percent impaired, limited or restricted  [facility] 09/07/2016, 8:34 AM   [facility], OT/L  [phone] 09/07/2016

## 2016-09-08 ENCOUNTER — Telehealth: Payer: Self-pay | Admitting: Cardiovascular Disease

## 2016-09-08 LAB — HEMOGLOBIN A1C
HEMOGLOBIN A1C: 6.2 % — AB (ref 4.8–5.6)
MEAN PLASMA GLUCOSE: 131 mg/dL

## 2016-09-08 NOTE — Telephone Encounter (Signed)
Spoke with Raven Gray. Patient had a mini-stroke (clot on stem of brain) and went to hospital yesterday. Patient was previously on 20mg  and changed to 15mg  d/t renal function.   Explained that this was verified by pharmacist in the hospital.   Samples provided - 3 bottles.

## 2016-09-08 NOTE — Telephone Encounter (Signed)
Patient calling the office for samples of medication:   1.  What medication and dosage are you requesting samples for?Xarelto 15mg     2.  Are you currently out of this medication? Yes   Also needs to speak to someone about the the medication . Please call

## 2016-09-09 DIAGNOSIS — Z09 Encounter for follow-up examination after completed treatment for conditions other than malignant neoplasm: Secondary | ICD-10-CM | POA: Diagnosis not present

## 2016-09-09 DIAGNOSIS — I4891 Unspecified atrial fibrillation: Secondary | ICD-10-CM | POA: Diagnosis not present

## 2016-09-23 ENCOUNTER — Telehealth: Payer: Self-pay | Admitting: Cardiovascular Disease

## 2016-09-23 NOTE — Telephone Encounter (Signed)
Medication samples have been provided to the patient.  Drug name: xarelto 15mg   Qty: 21 tablets  LOT: [id number]  Exp.Date: 04/19  Samples left at front desk for patient pick-up. Patient notified.  Advised ibuprofen is not recommended d/t increased risk of bleeding  Raven Gray 12:10 PM 09/23/2016

## 2016-09-23 NOTE — Telephone Encounter (Signed)
New Message      Do you have any samples of Xarelto 15mg  ?  Is she allowed to take ibuprophen?

## 2016-10-14 ENCOUNTER — Telehealth: Payer: Self-pay | Admitting: Cardiovascular Disease

## 2016-10-14 NOTE — Telephone Encounter (Signed)
New Message   Patient calling the office for samples of medication:   1.  What medication and dosage are you requesting samples for?  Rivaroxaban (XARELTO) 15 MG TABS tablet    2.  Are you currently out of this medication? No, has enough for this week.

## 2016-10-14 NOTE — Telephone Encounter (Signed)
Samples left up front spoke with pt's daughter.

## 2016-10-20 ENCOUNTER — Ambulatory Visit (INDEPENDENT_AMBULATORY_CARE_PROVIDER_SITE_OTHER): Payer: Medicare Other | Admitting: Neurology

## 2016-10-20 DIAGNOSIS — R2990 Unspecified symptoms and signs involving the nervous system: Secondary | ICD-10-CM

## 2016-10-20 DIAGNOSIS — G459 Transient cerebral ischemic attack, unspecified: Secondary | ICD-10-CM

## 2016-10-20 NOTE — Telephone Encounter (Signed)
Spoke w Ms Tawil's daughter [name].  Note that she got samples last week of med, was calling about separate concern.  Pt has had dyspnea x1-2 months. Notes home o2 evaluation done by PCP back in December, her concern is that "this hasn't really gone anywhere", still waiting on callback from that office -- trying to figure out if patient needs supplemental O2. Mainly, pt gets short of breath w exertion - daughter also recognized that they might want to rule out a cardiac concern. She does have hx of A Fib. No VS readings to report. We discussed, I advised probably best to come in for EKG, assessment by APP for consideration of further testing. Informed her we can do walk test to assess for O2 needs and this can be set up w [facility] or similar agency if criteria met. Sched w [place] on 10/23/16, appt details confirmed, she voiced thanks for call. Aware to call back if new concerns.

## 2016-10-20 NOTE — Telephone Encounter (Signed)
Follow up  Pts daughter voiced she would like for a nurse to contact her back.  Please f/u

## 2016-10-21 NOTE — Progress Notes (Signed)
  [facility] [address] [place]. [place] [address] [phone]       Electroencephalogram Procedure Note  Ms. Raven Gray Date of Birth:  12-22-1927 Medical Record Number:  [id number]   Indications: Diagnostic Date of Procedure : 10/20/2016 Medications: none Clinical history : Technical Description  This study was performed using 17 channel digital electroencephalographic recording equipment. International 10-20 electrode placement was used. The record was obtained with the patient awake and drowsy.  The record is of good technical quality for purposes of interpretation.   Activation Procedures:    photic stimulation . EEG Description Awake: Alpha Activity: The waking state record contains a well-defined bi-occipital alpha rhythm of  moderate amplitude with a dominant frequency of 9 Hz. Reactivity is present.  No paroxsymal activity, spikes, or sharp waves are noted. Technical component of study is adequate. The recording is 23 minutes EKG tracing shows atrial fibrillation with irregular heart rate  Sleep: With drowsiness, there is attenuation of the background alpha activity. As the patient enters into light sleep, vertex waves and symmetrical spindles are noted. K complexes are noted in sleep. Transition to the waking state is unremarkable.   Result of Activation Procedures: Hyperventilation: N/A. Photo Stimulation: No photic driving response is noted.  Summary Normal electroencephalogram, awake, asleep and with activation procedures. There are no focal lateralizing or epileptiform features.

## 2016-10-22 NOTE — Progress Notes (Signed)
Cardiology Office Note    Date:  10/23/2016   ID:  Raven Gray, DOB 23-Feb-1928, MRN [id number]  PCP:  Raven Bolt, MD  Cardiologist: Dr. Gwenlyn Found    Chief Complaint  Patient presents with  . Shortness of Breath    History of Present Illness:    Raven Gray is a 83 y.o. female with past medical history of chronic atrial fibrillation (on Xarelto), prior PE/DVT, HTN, HLD, and COPD who presents to the office today for evaluation of worsening dyspnea with exertion.   Was last seen by Dr. Gwenlyn Found in 06/2016 and doing well at that time. Was admitted in 08/2016 for blurred vision, slurred speech, and unresponsiveness. CODE STROKE was activated but imaging showed no acute infarcts, therefore her symptoms were thought to be most consistent with a TIA. An echocardiogram was obtained during the admission and showed an EF of 60-65%, severely dilated LA, mild pulmonic regurgitations, mild TR, and PA Pressure at 38 mm Hg.   In talking with the patient and her daughter Raven Gray), she reports having worsening shortness of breath with exertion for the past 3-4 months. This has acutely worsened within the past month and she is now only able to walk approximately 10 feet without having to stop and sit down to catch her breath. She was seen by her PCP for this and was supposed to be evaluated at home for possible oxygen requirements, but according to the patient this never happened as they were going to require a night study and she thought this was the incorrect test.  She denies any associated chest discomfort or palpitations. No orthopnea or PND. Does have occasional lower extremity edema which she takes Lasix 60mg  daily for. Weights have remained stable on her home scales (in the mid-180's).    Reports good compliance with her Xarelto. Denies any evidence of active bleeding, no melena or hematochezia.   Past Medical History:  Diagnosis Date  . Atrial fibrillation ([place])    a. on Xarelto  . Chest pain   .  COPD (chronic obstructive pulmonary disease) ([place])   . H/O echocardiogram    a. 08/2016: EF of 60-65%, severely dilated LA, mild pulmonic regurgitations, mild TR, and PA Pressure at 38 mm Hg  . History of depression   . History of DVT (deep vein thrombosis)   . History of pulmonary embolism   . Hyperlipidemia   . Hypertension   . Hypokalemia    Now improved with treatment  . Shortness of breath     Past Surgical History:  Procedure Laterality Date  . REPLACEMENT TOTAL KNEE    . ROTATOR CUFF REPAIR      Current Medications: Outpatient Medications Prior to Visit  Medication Sig Dispense Refill  . ALPRAZolam (XANAX) 0.25 MG tablet Take 1 tablet (0.25 mg total) by mouth 2 (two) times daily as needed for anxiety. 30 tablet 0  . isosorbide mononitrate (IMDUR) 60 MG 24 hr tablet Take 1 tablet (60 mg total) by mouth daily. 90 tablet 3  . metoprolol (LOPRESSOR) 25 MG tablet Take 1 tablet (25 mg total) by mouth 2 (two) times daily. 180 tablet 3  . Multiple Vitamins-Minerals (WOMENS 50+ MULTI VITAMIN/MIN) TABS Take 1 tablet by mouth daily.    . nitroGLYCERIN (NITROSTAT) 0.4 MG SL tablet Place 0.4 mg under the tongue as needed for chest pain. X 3 doses  0  . omeprazole (PRILOSEC) 20 MG capsule Take 20 mg by mouth daily as needed (for reflux/heartburn).  11  . oxybutynin (DITROPAN-XL) 5 MG 24 hr tablet Take 5 mg by mouth daily.  0  . potassium chloride SA (K-DUR,KLOR-CON) 20 MEQ tablet Take 60 mEq by mouth every morning.     . pravastatin (PRAVACHOL) 40 MG tablet Take 40 mg by mouth every morning.     . Rivaroxaban (XARELTO) 15 MG TABS tablet Take 1 tablet (15 mg total) by mouth every morning. 30 tablet 0  . tiotropium (SPIRIVA) 18 MCG inhalation capsule Place 18 mcg into inhaler and inhale daily.     Marland Kitchen venlafaxine XR (EFFEXOR-XR) 150 MG 24 hr capsule Take 150 mg by mouth daily.    Marland Kitchen zolpidem (AMBIEN) 5 MG tablet Take 5 mg by mouth at bedtime.    . furosemide (LASIX) 40 MG tablet Take 2  tablets by mouth X's 2 days then go back to 1 1/2 tablet by mouth daily (Patient taking differently: Take 60 mg by mouth every morning. ) 90 tablet 3   No facility-administered medications prior to visit.      Allergies:   Patient has no known allergies.   Social History   Social History  . Marital status: Widowed    Spouse name: N/A  . Number of children: N/A  . Years of education: N/A   Social History Main Topics  . Smoking status: Never Smoker  . Smokeless tobacco: Never Used  . Alcohol use No  . Drug use: No  . Sexual activity: No   Other Topics Concern  . None   Social History Narrative  . None     Family History:  The patient's family history includes Diabetes type II in her daughter; Heart attack in her father; Heart disease in her daughter.   Review of Systems:   Please see the history of present illness.     General:  No chills, fever, night sweats or weight changes. Positive for fatigue. Cardiovascular:  No chest pain, edema, orthopnea, palpitations, paroxysmal nocturnal dyspnea. Positive for dyspnea on exertion.  Dermatological: No rash, lesions/masses Respiratory: No cough, dyspnea Urologic: No hematuria, dysuria Abdominal:   No nausea, vomiting, diarrhea, bright red blood per rectum, melena, or hematemesis Neurologic:  No visual changes, wkns, changes in mental status. All other systems reviewed and are otherwise negative except as noted above.   Physical Exam:    VS:  BP (!) 147/83   Pulse 76   Ht 5\' 3"  (1.6 m)   Wt 188 lb (85.3 kg)   BMI 33.30 kg/m    General: Elderly Caucasian female appearing in no acute distress. Head: Normocephalic, atraumatic, sclera non-icteric, no xanthomas, nares are without discharge.  Neck: No carotid bruits. JVD at 8cm.  Lungs: Respirations regular and unlabored, without wheezes or rales.  Heart: Irregularly irregular. No S3 or S4.  No murmur, no rubs, or gallops appreciated. Abdomen: Soft, non-tender, non-distended  with normoactive bowel sounds. No hepatomegaly. No rebound/guarding. No obvious abdominal masses. Msk:  Strength and tone appear normal for age. No joint deformities or effusions. Extremities: No clubbing or cyanosis. Trace lower extremity edema.  Distal pedal pulses are 2+ bilaterally. Neuro: Alert and oriented X 3. Moves all extremities spontaneously. No focal deficits noted. Psych:  Responds to questions appropriately with a normal affect. Skin: No rashes or lesions noted  Wt Readings from Last 3 Encounters:  10/23/16 188 lb (85.3 kg)  09/06/16 187 lb 4.8 oz (85 kg)  07/29/16 185 lb (83.9 kg)     Studies/Labs Reviewed:   EKG:  EKG is ordered today.  The ekg ordered today demonstrates atrial fibrillation, HR 76, with known RBBB.   Recent Labs: 03/31/2016: Brain Natriuretic Peptide 255.8 09/06/2016: ALT 11; BUN 23; Creatinine, Ser 1.00; Hemoglobin 12.6; Platelets 253; Potassium 4.0; Sodium 142   Lipid Panel    Component Value Date/Time   CHOL 106 09/07/2016 0258   TRIG 56 09/07/2016 0258   HDL 43 09/07/2016 0258   CHOLHDL 2.5 09/07/2016 0258   VLDL 11 09/07/2016 0258   LDLCALC 52 09/07/2016 0258    Additional studies/ records that were reviewed today include:   Echocardiogram: 09/07/2016 Study Conclusions  - Left ventricle: The cavity size was normal. Wall thickness was   normal. Systolic function was normal. The estimated ejection   fraction was in the range of 60% to 65%. Left ventricular   diastolic function parameters were normal. - Left atrium: The atrium was severely dilated. - Atrial septum: No defect or patent foramen ovale was identified. - Pulmonary arteries: PA peak pressure: 38 mm Hg (S).  Assessment:    1. DOE (dyspnea on exertion)   2. Oxygen desaturation   3. Chronic atrial fibrillation ([place])   4. Chronic anticoagulation   5. Chronic diastolic heart failure ([place])   6. Chronic obstructive pulmonary disease, unspecified COPD type ([place])   7.  Medication management      Plan:   In order of problems listed above:  1. Dyspnea on Exertion/ Oxygen Desaturation - Reports having baseline dyspnea on exertion for 3-4 months with acute worsening of her symptoms in the past month. Now developing dyspnea with minimal ambulation (< 10 ft). Denies any associated chest discomfort, palpitations, orthopnea, PND, or weight gain.  - Recent echo in 08/2016 showed a preserved EF of 60-65%, severely dilated LA, mild pulmonic regurgitations, mild TR, and PA Pressure at 38 mm Hg.  - Ambulatory oxygen saturations checked in the office today and 99% on room air at rest, desaturating to 86% with ambulation. Rechecked with 2 L nasal cannula and saturations remained greater than 96% with ambulation. HR never above 90 with ambulation.  Will enter referral for Home O2 with a portable O2 tank as well.  - check BNP and BMET today. Referral to Pulmonology for further management of her COPD (has never seen a Pulmonologist) with now decreasing oxygen saturations.  2. Chronic Atrial Fibrillation/ Chronic Anticoagulation - HR in 70's at rest, up to 90's with ambulation. Continue Lopressor 25mg  BID.  - This patients CHA2DS2-VASc Score and unadjusted Ischemic Stroke Rate (% per year) is equal to 9.7 % stroke rate/year from a score of 6 (HTN, Female, Age (2), TE (2)). Continue Xarelto 15mg  daily (reduced dosing of 15mg  with creatinine clearance of 49 based off BMET in 08/2016). Denies any evidence of bleeding. Reports Hgb recently checked by PCP and normal.   3. Chronic Diastolic CHF - She does have trace lower extremity edema on examination but lungs are clear. Denies any orthopnea, PND, or weight gain. - Continue Lasix 60 mg daily. Was instructed to take an additional tablet as needed for lower extremity edema or weight gain >3 pounds overnight or > 5 pounds in one week. - check BNP and BMET today.   4. COPD - with her worsening oxygen desaturations and known COPD  (followed by PCP), I have entered a referral for Pulmonology.    Medication Adjustments/Labs and Tests Ordered: Current medicines are reviewed at length with the patient today.  Concerns regarding medicines are outlined above.  Medication changes,  Labs and Tests ordered today are listed in the Patient Instructions below. Patient Instructions  Medication Instructions:  Your physician recommends that you continue on your current medications as directed. Please refer to the Current Medication list given to you today.  Labwork: Have lab work completed today (BNP, BMET)  Testing/Procedures: NONE  Follow-Up: Has 2 month follow-up with Cecilie Kicks in 12/2016.  Any Other Special Instructions Will Be Listed Below (If Applicable).  We have sent a referral to [facility]: [phone]  We have also sent the order for your home oxygen to [facility]: [phone]  If you need a refill on your cardiac medications before your next appointment, please call your pharmacy.    Arna Medici, [place]  10/23/2016 12:53 PM    [facility] [place], [place], [place]  [address] Phone: [phone]; Fax: [phone]  [address], [place], [place] [address] Phone: [phone]

## 2016-10-23 ENCOUNTER — Encounter: Payer: Self-pay | Admitting: Student

## 2016-10-23 ENCOUNTER — Ambulatory Visit (INDEPENDENT_AMBULATORY_CARE_PROVIDER_SITE_OTHER): Payer: Medicare Other | Admitting: Student

## 2016-10-23 VITALS — BP 147/83 | HR 76 | Ht 63.0 in | Wt 188.0 lb

## 2016-10-23 DIAGNOSIS — I482 Chronic atrial fibrillation, unspecified: Secondary | ICD-10-CM

## 2016-10-23 DIAGNOSIS — Z7901 Long term (current) use of anticoagulants: Secondary | ICD-10-CM | POA: Insufficient documentation

## 2016-10-23 DIAGNOSIS — R0609 Other forms of dyspnea: Secondary | ICD-10-CM

## 2016-10-23 DIAGNOSIS — J449 Chronic obstructive pulmonary disease, unspecified: Secondary | ICD-10-CM

## 2016-10-23 DIAGNOSIS — I5032 Chronic diastolic (congestive) heart failure: Secondary | ICD-10-CM

## 2016-10-23 DIAGNOSIS — R0902 Hypoxemia: Secondary | ICD-10-CM | POA: Diagnosis not present

## 2016-10-23 DIAGNOSIS — Z79899 Other long term (current) drug therapy: Secondary | ICD-10-CM | POA: Diagnosis not present

## 2016-10-23 MED ORDER — FUROSEMIDE 40 MG PO TABS: 60.0000 mg | ORAL_TABLET | Freq: Every morning | ORAL | Status: DC

## 2016-10-23 NOTE — Patient Instructions (Signed)
Medication Instructions:  Your physician recommends that you continue on your current medications as directed. Please refer to the Current Medication list given to you today.  Labwork: Have lab work completed today (BNP, BMET)  Testing/Procedures: NONE  Follow-Up: Your physician wants you to follow-up in: October with Dr. Gwenlyn Found. You will receive a reminder letter in the mail two months in advance. If you don't receive a letter, please call our office to schedule the follow-up appointment.   Any Other Special Instructions Will Be Listed Below (If Applicable).  We have sent a referral to [facility]: [phone]  We have also sent the order for your home oxygen to [facility]: [phone]   If you need a refill on your cardiac medications before your next appointment, please call your pharmacy.

## 2016-10-24 LAB — BASIC METABOLIC PANEL
BUN: 14 mg/dL (ref 7–25)
CO2: 26 mmol/L (ref 20–31)
CREATININE: 1.16 mg/dL — AB (ref 0.60–0.88)
Calcium: 9 mg/dL (ref 8.6–10.4)
Chloride: 103 mmol/L (ref 98–110)
Glucose, Bld: 71 mg/dL (ref 65–99)
Potassium: 4.5 mmol/L (ref 3.5–5.3)
SODIUM: 140 mmol/L (ref 135–146)

## 2016-10-24 LAB — BRAIN NATRIURETIC PEPTIDE: Brain Natriuretic Peptide: 306.7 pg/mL — ABNORMAL HIGH (ref <100)

## 2016-10-28 ENCOUNTER — Telehealth: Payer: Self-pay | Admitting: Student

## 2016-10-28 NOTE — Telephone Encounter (Signed)
Spoke with pt dtr, aware we sent the order to [facility] and also sent a message to [place] who is our contact. We will send him another message and she has the number to call if they still do not hear.

## 2016-10-28 NOTE — Telephone Encounter (Signed)
New Message   Per daughter pt is supposed to be getting Oxygen, and was told that if she hadn't received oxygen within three days to call back. Requesting call back.

## 2016-10-29 ENCOUNTER — Telehealth: Payer: Self-pay | Admitting: Neurology

## 2016-10-29 ENCOUNTER — Encounter: Payer: Self-pay | Admitting: Internal Medicine

## 2016-10-29 ENCOUNTER — Ambulatory Visit (INDEPENDENT_AMBULATORY_CARE_PROVIDER_SITE_OTHER): Payer: Medicare Other | Admitting: Internal Medicine

## 2016-10-29 ENCOUNTER — Ambulatory Visit (INDEPENDENT_AMBULATORY_CARE_PROVIDER_SITE_OTHER)
Admission: RE | Admit: 2016-10-29 | Discharge: 2016-10-29 | Disposition: A | Discharge status: Home / Self Care | Payer: Medicare Other | Source: Ambulatory Visit | Attending: Internal Medicine | Admitting: Internal Medicine

## 2016-10-29 VITALS — BP 114/68 | HR 76 | Ht 63.0 in | Wt 187.0 lb

## 2016-10-29 DIAGNOSIS — J9611 Chronic respiratory failure with hypoxia: Secondary | ICD-10-CM | POA: Diagnosis not present

## 2016-10-29 DIAGNOSIS — R0609 Other forms of dyspnea: Secondary | ICD-10-CM

## 2016-10-29 DIAGNOSIS — R0600 Dyspnea, unspecified: Secondary | ICD-10-CM | POA: Diagnosis not present

## 2016-10-29 MED ORDER — FUROSEMIDE 40 MG PO TABS: 80.0000 mg | ORAL_TABLET | Freq: Every morning | ORAL | Status: DC

## 2016-10-29 NOTE — Telephone Encounter (Signed)
Patients daughter called in reference to EEG results.  Please call

## 2016-10-29 NOTE — Telephone Encounter (Signed)
Kindly inform  the patient that EEG study on 10/20/16 was normal

## 2016-10-29 NOTE — Telephone Encounter (Signed)
Pts daughter needs EEG results. Thanks

## 2016-10-29 NOTE — Telephone Encounter (Signed)
Rn call patients daughter [name] that there moms EEG was normal. Pts daughter verbalized understanding.

## 2016-10-29 NOTE — Patient Instructions (Addendum)
Stop spiriva for now   Try walking with 02 =  2lpm  to see if can walk farther with it than without it and if so continue to use it with ambulation  Please see patient coordinator before you leave today  to schedule overnight pulse oximetry   Increase lasix 40 mg from one and half to two daily and leave the potassium dose the same for now  Please remember to go to the x-ray department downstairs in the basement  for your tests - we will call you with the results when they are available.  Call in 2 weeks if not better to your satisfaction and we'll consider additional testing

## 2016-10-29 NOTE — Progress Notes (Signed)
Subjective:     Patient ID: Raven Gray, female   DOB: 12-03-1927     MRN: [id number]  HPI fills  27 yowm never smoker decreased activity x years due to djd / afib with h/o PE 05/2012 on xarelto  referred to pulmonary clinic 10/29/2016 by Dr   Gwenlyn Found Bernerd Pho for unexplained doe since Oct 2017    10/29/2016 1st [facility] office visit/ Raven Gray   Chief Complaint  Patient presents with  . Pulmonary Consult    Referred by Bernerd Pho, PA. Pt c/o DOE for the past several months. She gets SOB walking from her kitchen to her living room at home. She was recently prescribed amb o2 per cards, but has not recieved this yet.   indolent onset progressively worsening doe x oct 2017 to point where gives out room to room and dropped sats at Dr Kennon Holter office so 02 ordered but not started yet s assoc cough / wheeze or increase in chronic leg swelling - rx as COPD with spiriva and not improved   No obvious day to day or daytime variability or assoc excess/ purulent sputum or mucus plugs or hemoptysis or cp or chest tightness, subjective wheeze or overt sinus or hb symptoms. No unusual exp hx or h/o childhood pna/ asthma or knowledge of premature birth.  Sleeping ok without nocturnal  or early am exacerbation  of respiratory  c/o's or need for noct saba. Also denies any obvious fluctuation of symptoms with weather or environmental changes or other aggravating or alleviating factors except as outlined above   Current Medications, Allergies, Complete Past Medical History, Past Surgical History, Family History, and Social History were reviewed in [employer] record.  ROS  The following are not active complaints unless bolded sore throat, dysphagia, dental problems, itching, sneezing,  nasal congestion or excess/ purulent secretions, ear ache,   fever, chills, sweats, unintended wt loss, classically pleuritic or exertional cp,  orthopnea pnd or leg swelling, presyncope, palpitations,  abdominal pain, anorexia, nausea, vomiting, diarrhea  or change in bowel or bladder habits, change in stools or urine, dysuria,hematuria,  rash, arthralgias resp L knee, visual complaints, headache, numbness, weakness or ataxia or problems with walking or coordination,  change in mood/affect or memory.           Review of Systems     Objective:   Physical Exam W/c bound elderly wf nad   Wt Readings from Last 3 Encounters:  10/29/16 187 lb (84.8 kg)  10/23/16 188 lb (85.3 kg)  09/06/16 187 lb 4.8 oz (85 kg)    Vital signs reviewed - - Note on arrival 02 sats  95% on RA     HEENT: nl dentition, turbinates, and oropharynx. Nl external ear canals without cough reflex   NECK :  without JVD/Nodes/TM/ nl carotid upstrokes bilaterally   LUNGS: no acc muscle use,  Nl contour chest which is clear to A and P bilaterally without cough on insp or exp maneuvers   CV:  IRIR   no s3 or murmur or increase in P2, nad 2+ pitting sym  lower ext edema   ABD:  soft and nontender with nl inspiratory excursion in the supine position. No bruits or organomegaly appreciated, bowel sounds nl  MS:  Nl gait/ ext warm without deformities, calf tenderness, cyanosis or clubbing No obvious joint restrictions   SKIN: warm and dry without lesions    NEURO:  alert, approp, nl sensorium with  no motor or  cerebellar deficits apparent.    CXR PA and Lateral:   10/29/2016 :    I personally reviewed images and agree with radiology impression as follows:    Aortic atherosclerosis. Stable large hiatal hernia. No acute cardiopulmonary abnormality seen. My impression: also significant kyphosis, huge hernia space occupying concerns  Labs  reviewed:      Chemistry      Component Value Date/Time   NA 140 10/23/2016 1222   K 4.5 10/23/2016 1222   CL 103 10/23/2016 1222   CO2 26 10/23/2016 1222   BUN 14 10/23/2016 1222   CREATININE 1.16 (H) 10/23/2016 1222      Component Value Date/Time   CALCIUM 9.0  10/23/2016 1222   ALKPHOS 89 09/06/2016 1210   AST 20 09/06/2016 1210   ALT 11 (L) 09/06/2016 1210   BILITOT 0.8 09/06/2016 1210       BNP           307                                   10/29/2016     Lab Results  Component Value Date   WBC 6.8 09/06/2016   HGB 12.6 09/06/2016   HCT 37.0 09/06/2016   MCV 86.8 09/06/2016   PLT 253 09/06/2016         Lab Results  Component Value Date   TSH 1.786 10/20/2014            Assessment:

## 2016-10-30 DIAGNOSIS — J449 Chronic obstructive pulmonary disease, unspecified: Secondary | ICD-10-CM | POA: Diagnosis not present

## 2016-10-30 NOTE — Assessment & Plan Note (Addendum)
Echo 09/07/16 - Left ventricle: The cavity size was normal. Wall thickness was   normal. Systolic function was normal. The estimated ejection   fraction was in the range of 60% to 65%. Left ventricular   diastolic function parameters were normal. - Left atrium: The atrium was severely dilated. - Atrial septum: No defect or patent foramen ovale was identified. - Pulmonary arteries: PA peak pressure: 38 mm Hg (S). 10/29/2016   Walked RA x one half lap = 90 ft  stopped due to  Sob,weak, unsteady on feet  no desats - Spirometry 10/29/2016  Could not perform despite multiple attempts   Symptoms are markedly disproportionate to objective findings and not clear this is all a  lung problem but pt does appear to have difficult airway management issues.   DDX of  difficult airways management almost all start with A and  include Adherence, Ace Inhibitors, Acid Reflux, Active Sinus Disease, Alpha 1 Antitripsin deficiency, Anxiety masquerading as Airways dz,  ABPA,  Allergy(esp in young), Aspiration (esp in elderly), Adverse effects of meds,  Active smokers, A bunch of PE's (a small clot burden can't cause this syndrome unless there is already severe underlying pulm or vascular dz with poor reserve) plus two Bs  = Bronchiectasis and Beta blocker use..and one C= CHF  Adherence is always the initial "prime suspect" and is a multilayered concern that requires a "trust but verify" approach in every patient - starting with knowing how to use medications, especially inhalers, correctly, keeping up with refills and understanding the fundamental difference between maintenance and prns vs those medications only taken for a very short course and then stopped and not refilled.   ? Acid (or non-acid) GERD > always difficult to exclude as up to 75% of pts in some series report no assoc GI/ Heartburn symptoms and she has a very large HH >  Continue  PPI  ? Allergy /asthma > very unlikely s cough /noct wheeze or flares  ?  Adverse effects of dpi > d/c spiriva as this isn't copd anyway  ? Aspiration > also unlikely s cough or pna hx though always a concern in the elderly   ? Anxiety/depression/ deconditioning > usually at the bottom of this list of usual suspects but should be much higher on this pt's based on H and P and note already on psychotropics . > Follow up per Primary Care planned    ? A bunch of PE's > h/o PE but note PA systolic not that high for a pt with severe LAE and already on xarelto  ? CHF >  Pitting edema, severe LAE rec lasix and if not better then add aldactone next and adjusting K down but that will req close f/u of bun/creat/k and defer this to cards   Total time devoted to counseling  > 50 % of initial 60 min office visit:  review case with pt/daughter including extensive cards notes  discussion of options/alternatives/ personally creating written customized instructions  in presence of pt  then going over those specific  Instructions directly with the pt including how to use all of the meds but in particular covering each new medication in detail and the difference between the maintenance= "automatic" meds and the prns using an action plan format for the latter (If this problem/symptom => do that organization reading Left to right).  Please see AVS from this visit for a full list of these instructions which I personally wrote for this pt and  are  unique to this visit.

## 2016-10-30 NOTE — Progress Notes (Signed)
Spoke with pt and notified of results per Dr. Wert. Pt verbalized understanding and denied any questions. 

## 2016-10-30 NOTE — Assessment & Plan Note (Signed)
10/29/2016   Walked RA x 90 ft - stopped due to  Sob/weak/ no desat slow pace  - ONO RA 10/29/2016 >>>   She has already qualified for amb 02 though I strongly doubt it will help - may benefit heart-wise from noct 02 which I ordered.

## 2016-11-03 ENCOUNTER — Telehealth: Payer: Self-pay | Admitting: Cardiovascular Disease

## 2016-11-03 NOTE — Telephone Encounter (Signed)
Medication samples have been provided to the patient.  Drug name: xarelto 15mg   Qty: 3 bottles  LOT: [id number]  Exp.Date: 09/2018  Samples left at front desk for patient pick-up. Patient notified.  Sheral Apley M 2:46 PM 11/03/2016

## 2016-11-03 NOTE — Telephone Encounter (Signed)
New message    Daughter calling     Patient calling the office for samples of medication:   1.  What medication and dosage are you requesting samples for?Rivaroxaban (XARELTO) 15 MG TABS tablet  2.  Are you currently out of this medication?  Yes

## 2016-11-10 DIAGNOSIS — J449 Chronic obstructive pulmonary disease, unspecified: Secondary | ICD-10-CM | POA: Diagnosis not present

## 2016-11-10 DIAGNOSIS — R0602 Shortness of breath: Secondary | ICD-10-CM | POA: Diagnosis not present

## 2016-11-11 ENCOUNTER — Encounter: Payer: Self-pay | Admitting: Internal Medicine

## 2016-11-11 DIAGNOSIS — R0602 Shortness of breath: Secondary | ICD-10-CM | POA: Diagnosis not present

## 2016-11-11 DIAGNOSIS — J449 Chronic obstructive pulmonary disease, unspecified: Secondary | ICD-10-CM | POA: Diagnosis not present

## 2016-11-12 ENCOUNTER — Telehealth: Payer: Self-pay | Admitting: Internal Medicine

## 2016-11-12 NOTE — Telephone Encounter (Signed)
Spoke with pt's daughter, [name]. States that since the pt's last OV, she her health has not changed. Pt was set up with oxygen in the home and has been using 2L. She is still having issues with DOE and leg weakness. Pt has been taking Lasix as instructed by MW at her last OV. Pt had ONO done with [facility] a few nights ago. [name] is wanting the results of this test.  MW - please advise if you have this ONO report/results. Thanks.  Instructions from 10/29/16 OV: Stop spiriva for now   Try walking with 02 =  2lpm  to see if can walk farther with it than without it and if so continue to use it with ambulation  Please see patient coordinator before you leave today  to schedule overnight pulse oximetry   Increase lasix 40 mg from one and half to two daily and leave the potassium dose the same for now  Please remember to go to the x-ray department downstairs in the basement  for your tests - we will call you with the results when they are available.  Call in 2 weeks if not better to your satisfaction and we'll consider additional testing

## 2016-11-12 NOTE — Telephone Encounter (Signed)
Have not seen any report, make sure we have it and place in my lookat stack

## 2016-11-12 NOTE — Telephone Encounter (Signed)
Called [facility] and spoke with [place]. He states that these results have not been download by their respiratory department yet. Once they are he will fax them to the up front fax. Will await fax.

## 2016-11-17 ENCOUNTER — Telehealth: Payer: Self-pay | Admitting: Internal Medicine

## 2016-11-17 NOTE — Telephone Encounter (Signed)
MW please advise if you have these ONO results. Thanks.

## 2016-11-17 NOTE — Telephone Encounter (Signed)
Spoke with Raven Gray at [facility], clarified that pt is to be on 02 with exertion and nocturnal 02 was ordered after desat was noted on ONO.  Nothing further needed.

## 2016-11-17 NOTE — Telephone Encounter (Signed)
Patient daughter [name] called checking on ONO results - She can be reached at [phone] -pr

## 2016-11-17 NOTE — Telephone Encounter (Signed)
See report recs

## 2016-11-17 NOTE — Telephone Encounter (Signed)
Per MW- she needs to be on 2lpm o2 with sleep  Spoke with the pt's daughter, [name] and notified of recs and she verbalized understanding

## 2016-11-18 ENCOUNTER — Ambulatory Visit (INDEPENDENT_AMBULATORY_CARE_PROVIDER_SITE_OTHER): Payer: Medicare Other | Admitting: Neurology

## 2016-11-18 ENCOUNTER — Encounter: Payer: Self-pay | Admitting: Neurology

## 2016-11-18 VITALS — BP 95/61 | HR 54 | Ht 63.0 in | Wt 185.0 lb

## 2016-11-18 DIAGNOSIS — G450 Vertebro-basilar artery syndrome: Secondary | ICD-10-CM | POA: Diagnosis not present

## 2016-11-18 NOTE — Progress Notes (Signed)
[facility] [address] [place]. [place] [address] [phone]       OFFICE FOLLOW-UP NOTE  Raven. Raven Gray Date of Birth:  09/02/1928 Medical Record Number:  [id number]   HPI: Raven Gray is a 83 year Caucasian lady seen today for first office follow-up visit following hospital admission in December 2017 for TIA. History is up 10 from the patient, daughter and review of Hospital medical records. I personally reviewed imaging  Films.Raven Gray an 83 y.o.femalewith a past medical history that is pertinent for HTN, HLD, atrial fibrillation on xarelto, DVT, and depression, brought in by EMS due to acute onset of dysarthria, blurred vision, and transient unresponsiveness.Patient said that she had " a similar but less intense episode" a week ago.She said that she was at church when she suddenly developed blurred vision and slurred speech, and was unresponsive with snoring respirations for about 5 minutes. Patient states she still has some blurred vision but otherwise denies HA, vertigo, double vision, focal weakness, or visual disturbances./ NIHSS 0. CT head without acute abnormality, ASPECTS 10.Date last known well: 09/06/16.Time last known well: 75 AM. tPA Given:no, has no deficits on exam The patient was on Xarelto for atrial fibrillation but after discussion with her it was clear that she was not taking it exactly at the same time everyday and with proper meals and mostly on empty stomach. MRI scan the brain did not show an acute stroke but only chronic microvascular ischemic changes. MRA of the brain showed 50% stenosis of the left distal M1 segment of the middle cerebral artery which is asymptomatic. Transthoracic echo showed normal ejection fraction. EEG showed no seizure activity. LDL cholesterol was 52 mg percent. Hemoglobin A1c was 6.2. Patient was counseled to take Xarelto consistently with the proper meals at the same time. She states she's done well since discharge. She had  outpatient lesion 10/20/16 which was normal. She is living at home alone but her family lives nearby and checks on her frequently. She uses a walker at home mostly fine dose and has not started using a wheelchair for long distances. She has been started on home oxygen by her cardiologist for congestive heart failure but pulmonologist recently addedt oxygen at night as well. She's had no recurrent stroke or TIA symptoms.  ROS:   14 system review of systems is positive for runny nose, double vision, shortness of breath, chest tightness, excessive thirst, daytime sleepiness, easy bruising and bleeding, dizziness, depression, nervousness, anxiety, back pain, walking difficulty and all other systems negative  PMH:  Past Medical History:  Diagnosis Date  . Atrial fibrillation ([place])    a. on Xarelto  . Chest pain   . H/O echocardiogram    a. 08/2016: EF of 60-65%, severely dilated LA, mild pulmonic regurgitations, mild TR, and PA Pressure at 38 mm Hg  . History of depression   . History of DVT (deep vein thrombosis)   . History of pulmonary embolism   . Hyperlipidemia   . Hypertension   . Hypokalemia    Now improved with treatment  . Shortness of breath   . Stroke [facility]     Social History:  Social History   Social History  . Marital status: Widowed    Spouse name: N/A  . Number of children: N/A  . Years of education: N/A   Occupational History  . Not on file.   Social History Main Topics  . Smoking status: Never Smoker  . Smokeless tobacco: Never  Used  . Alcohol use No  . Drug use: No  . Sexual activity: No   Other Topics Concern  . Not on file   Social History Narrative  . No narrative on file    Medications:   Current Outpatient Prescriptions on File Prior to Visit  Medication Sig Dispense Refill  . acetaminophen (TYLENOL) 325 MG tablet Take 650 mg by mouth every 6 (six) hours as needed.    . ALPRAZolam (XANAX) 0.25 MG tablet Take 1 tablet (0.25 mg total) by mouth 2  (two) times daily as needed for anxiety. 30 tablet 0  . furosemide (LASIX) 40 MG tablet Take 2 tablets (80 mg total) by mouth every morning.    . isosorbide mononitrate (IMDUR) 60 MG 24 hr tablet Take 1 tablet (60 mg total) by mouth daily. 90 tablet 3  . metoprolol (LOPRESSOR) 25 MG tablet Take 1 tablet (25 mg total) by mouth 2 (two) times daily. 180 tablet 3  . Multiple Vitamins-Minerals (WOMENS 50+ MULTI VITAMIN/MIN) TABS Take 1 tablet by mouth daily.    . nitroGLYCERIN (NITROSTAT) 0.4 MG SL tablet Place 0.4 mg under the tongue as needed for chest pain. X 3 doses  0  . omeprazole (PRILOSEC) 20 MG capsule Take 20 mg by mouth daily as needed (for reflux/heartburn).   11  . oxybutynin (DITROPAN-XL) 5 MG 24 hr tablet Take 5 mg by mouth daily.  0  . potassium chloride SA (K-DUR,KLOR-CON) 20 MEQ tablet Take 60 mEq by mouth every morning.     . pravastatin (PRAVACHOL) 40 MG tablet Take 40 mg by mouth every morning.     . Rivaroxaban (XARELTO) 15 MG TABS tablet Take 1 tablet (15 mg total) by mouth every morning. 30 tablet 0  . venlafaxine XR (EFFEXOR-XR) 150 MG 24 hr capsule Take 150 mg by mouth daily.    Marland Kitchen zolpidem (AMBIEN) 5 MG tablet Take 5 mg by mouth at bedtime.     No current facility-administered medications on file prior to visit.     Allergies:  No Known Allergies  Physical Exam General: Mildly obese elderly Caucasian lady seated, in no evident distress. She is on home oxygen Head: head normocephalic and atraumatic.  Neck: supple with no carotid or supraclavicular bruits Cardiovascular: regular rate and rhythm, no murmurs Musculoskeletal: no deformity Skin:  no rash/petichiae Vascular:  Normal pulses all extremities Vitals:   11/18/16 1350  BP: 95/61  Pulse: (!) 54   Neurologic Exam Mental Status: Awake and fully alert. Oriented to place and time. Recent and remote memory intact. Attention span, concentration and fund of knowledge appropriate. Mood and affect appropriate.    Cranial Nerves: Fundoscopic exam reveals sharp disc margins. Pupils equal, briskly reactive to light. Extraocular movements full without nystagmus. Visual fields full to confrontation. Hearing intact. Facial sensation intact. Face, tongue, palate moves normally and symmetrically.  Motor: Normal bulk and tone. Normal strength in all tested extremity muscles. Sensory.: intact to touch ,pinprick .position and vibratory sensation.  Coordination: Rapid alternating movements normal in all extremities. Finger-to-nose and heel-to-shin performed accurately bilaterally. Gait and Station: Arises from wheelchair with  difficulty. Stance is wide-based l. Gait demonstrates normal stride length and imbalance . Unable to heel, toe and tandem walk without difficulty.  Reflexes: 1+ and symmetric. Toes downgoing.   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 Vascular risk factors of hypertension, hyperlipidemia , congestive heart failure and obesity    PLAN: I had a long d/w patient about her recent TIA, atrial fibrillation, risk for recurrent stroke/TIAs, personally independently reviewed imaging studies and stroke evaluation results and answered questions.Continue Xarelto (rivaroxaban) daily  for secondary stroke prevention and maintain strict control of hypertension with blood pressure goal below 130/90, diabetes with hemoglobin A1c goal below 6.5% and lipids with LDL cholesterol goal below 70 mg/dL. I also advised the patient to eat a healthy diet with plenty of whole grains, cereals, fruits and vegetables, exercise regularly and maintain ideal body weight. I have advised the patient to follow-up with primary care physician next week and to keep a log of blood  pressure and if it remains low persistently may need to reduce some of her medications Followup in the future with my nurse practitioner in  6 months or call earlier if necessary Greater than 50% of time during this 25 minute visit was spent on counseling,explanation of diagnosis, planning of further management, discussion with patient and family and coordination of care Antony Contras, MD  [facility] [address] [place], [place] [address]  Phone [phone] Fax [phone] Note: This document was prepared with digital dictation and possible smart phrase technology. Any transcriptional errors that result from this process are unintentional

## 2016-11-18 NOTE — Patient Instructions (Signed)
I had a long d/w patient about her recent TIA, atrial fibrillation, risk for recurrent stroke/TIAs, personally independently reviewed imaging studies and stroke evaluation results and answered questions.Continue Xarelto (rivaroxaban) daily  for secondary stroke prevention and maintain strict control of hypertension with blood pressure goal below 130/90, diabetes with hemoglobin A1c goal below 6.5% and lipids with LDL cholesterol goal below 70 mg/dL. I also advised the patient to eat a healthy diet with plenty of whole grains, cereals, fruits and vegetables, exercise regularly and maintain ideal body weight. I have advised the patient to follow-up with primary care physician next week and to keep a log of blood pressure and if it remains low persistently may need to reduce some of her medications Followup in the future with my nurse practitioner in  6 months or call earlier if necessary Stroke Prevention Some medical conditions and behaviors are associated with an increased chance of having a stroke. You may prevent a stroke by making healthy choices and managing medical conditions. How can I reduce my risk of having a stroke?  Stay physically active. Get at least 30 minutes of activity on most or all days.  Do not smoke. It may also be helpful to avoid exposure to secondhand smoke.  Limit alcohol use. Moderate alcohol use is considered to be:  No more than 2 drinks per day for men.  No more than 1 drink per day for nonpregnant women.  Eat healthy foods. This involves:  Eating 5 or more servings of fruits and vegetables a day.  Making dietary changes that address high blood pressure (hypertension), high cholesterol, diabetes, or obesity.  Manage your cholesterol levels.  Making food choices that are high in fiber and low in saturated fat, trans fat, and cholesterol may control cholesterol levels.  Take any prescribed medicines to control cholesterol as directed by your health care  provider.  Manage your diabetes.  Controlling your carbohydrate and sugar intake is recommended to manage diabetes.  Take any prescribed medicines to control diabetes as directed by your health care provider.  Control your hypertension.  Making food choices that are low in salt (sodium), saturated fat, trans fat, and cholesterol is recommended to manage hypertension.  Ask your health care provider if you need treatment to lower your blood pressure. Take any prescribed medicines to control hypertension as directed by your health care provider.  If you are 61-71 years of age, have your blood pressure checked every 3-5 years. If you are 68 years of age or older, have your blood pressure checked every year.  Maintain a healthy weight.  Reducing calorie intake and making food choices that are low in sodium, saturated fat, trans fat, and cholesterol are recommended to manage weight.  Stop drug abuse.  Avoid taking birth control pills.  Talk to your health care provider about the risks of taking birth control pills if you are over 34 years old, smoke, get migraines, or have ever had a blood clot.  Get evaluated for sleep disorders (sleep apnea).  Talk to your health care provider about getting a sleep evaluation if you snore a lot or have excessive sleepiness.  Take medicines only as directed by your health care provider.  For some people, aspirin or blood thinners (anticoagulants) are helpful in reducing the risk of forming abnormal blood clots that can lead to stroke. If you have the irregular heart rhythm of atrial fibrillation, you should be on a blood thinner unless there is a good reason you cannot take  them.  Understand all your medicine instructions.  Make sure that other conditions (such as anemia or atherosclerosis) are addressed. Get help right away if:  You have sudden weakness or numbness of the face, arm, or leg, especially on one side of the body.  Your face or eyelid  droops to one side.  You have sudden confusion.  You have trouble speaking (aphasia) or understanding.  You have sudden trouble seeing in one or both eyes.  You have sudden trouble walking.  You have dizziness.  You have a loss of balance or coordination.  You have a sudden, severe headache with no known cause.  You have new chest pain or an irregular heartbeat. Any of these symptoms may represent a serious problem that is an emergency. Do not wait to see if the symptoms will go away. Get medical help at once. Call your local emergency services (911 in [place] Do not drive yourself to the hospital. This information is not intended to replace advice given to you by your health care provider. Make sure you discuss any questions you have with your health care provider. Document Released: 10/16/2004 Document Revised: 02/14/2016 Document Reviewed: 03/11/2013 Elsevier Interactive Patient Education  2017 [employer].

## 2016-11-20 DIAGNOSIS — I10 Essential (primary) hypertension: Secondary | ICD-10-CM | POA: Diagnosis not present

## 2016-11-20 DIAGNOSIS — E789 Disorder of lipoprotein metabolism, unspecified: Secondary | ICD-10-CM | POA: Diagnosis not present

## 2016-11-24 ENCOUNTER — Observation Stay (HOSPITAL_COMMUNITY)
Admission: EM | Admit: 2016-11-24 | Discharge: 2016-11-25 | Disposition: A | Discharge status: Home / Self Care | Payer: Medicare Other | Attending: Internal Medicine | Admitting: Internal Medicine

## 2016-11-24 ENCOUNTER — Encounter (HOSPITAL_COMMUNITY): Payer: Self-pay | Admitting: *Deleted

## 2016-11-24 ENCOUNTER — Observation Stay (HOSPITAL_COMMUNITY): Payer: Medicare Other

## 2016-11-24 DIAGNOSIS — R29818 Other symptoms and signs involving the nervous system: Secondary | ICD-10-CM | POA: Diagnosis not present

## 2016-11-24 DIAGNOSIS — G459 Transient cerebral ischemic attack, unspecified: Secondary | ICD-10-CM | POA: Diagnosis not present

## 2016-11-24 DIAGNOSIS — Z7901 Long term (current) use of anticoagulants: Secondary | ICD-10-CM | POA: Diagnosis not present

## 2016-11-24 DIAGNOSIS — J441 Chronic obstructive pulmonary disease with (acute) exacerbation: Secondary | ICD-10-CM | POA: Diagnosis not present

## 2016-11-24 DIAGNOSIS — R29810 Facial weakness: Secondary | ICD-10-CM | POA: Insufficient documentation

## 2016-11-24 DIAGNOSIS — F329 Major depressive disorder, single episode, unspecified: Secondary | ICD-10-CM | POA: Diagnosis not present

## 2016-11-24 DIAGNOSIS — Z86711 Personal history of pulmonary embolism: Secondary | ICD-10-CM | POA: Diagnosis not present

## 2016-11-24 DIAGNOSIS — J9610 Chronic respiratory failure, unspecified whether with hypoxia or hypercapnia: Secondary | ICD-10-CM | POA: Insufficient documentation

## 2016-11-24 DIAGNOSIS — I351 Nonrheumatic aortic (valve) insufficiency: Secondary | ICD-10-CM | POA: Diagnosis not present

## 2016-11-24 DIAGNOSIS — R531 Weakness: Secondary | ICD-10-CM | POA: Insufficient documentation

## 2016-11-24 DIAGNOSIS — E860 Dehydration: Secondary | ICD-10-CM | POA: Diagnosis not present

## 2016-11-24 DIAGNOSIS — Z79899 Other long term (current) drug therapy: Secondary | ICD-10-CM | POA: Diagnosis not present

## 2016-11-24 DIAGNOSIS — J449 Chronic obstructive pulmonary disease, unspecified: Secondary | ICD-10-CM | POA: Insufficient documentation

## 2016-11-24 DIAGNOSIS — E785 Hyperlipidemia, unspecified: Secondary | ICD-10-CM | POA: Diagnosis present

## 2016-11-24 DIAGNOSIS — Z86718 Personal history of other venous thrombosis and embolism: Secondary | ICD-10-CM | POA: Insufficient documentation

## 2016-11-24 DIAGNOSIS — Z9981 Dependence on supplemental oxygen: Secondary | ICD-10-CM | POA: Diagnosis not present

## 2016-11-24 DIAGNOSIS — I10 Essential (primary) hypertension: Secondary | ICD-10-CM | POA: Diagnosis not present

## 2016-11-24 DIAGNOSIS — Z8673 Personal history of transient ischemic attack (TIA), and cerebral infarction without residual deficits: Secondary | ICD-10-CM | POA: Diagnosis not present

## 2016-11-24 DIAGNOSIS — I6789 Other cerebrovascular disease: Secondary | ICD-10-CM | POA: Diagnosis not present

## 2016-11-24 DIAGNOSIS — I4891 Unspecified atrial fibrillation: Secondary | ICD-10-CM | POA: Diagnosis not present

## 2016-11-24 DIAGNOSIS — K219 Gastro-esophageal reflux disease without esophagitis: Secondary | ICD-10-CM | POA: Diagnosis not present

## 2016-11-24 DIAGNOSIS — G9340 Encephalopathy, unspecified: Secondary | ICD-10-CM | POA: Diagnosis present

## 2016-11-24 DIAGNOSIS — G9341 Metabolic encephalopathy: Secondary | ICD-10-CM | POA: Diagnosis not present

## 2016-11-24 DIAGNOSIS — R269 Unspecified abnormalities of gait and mobility: Secondary | ICD-10-CM | POA: Diagnosis not present

## 2016-11-24 LAB — HEPATIC FUNCTION PANEL
ALT: 10 U/L — AB (ref 14–54)
AST: 19 U/L (ref 15–41)
Albumin: 3.4 g/dL — ABNORMAL LOW (ref 3.5–5.0)
Alkaline Phosphatase: 73 U/L (ref 38–126)
BILIRUBIN INDIRECT: 0.4 mg/dL (ref 0.3–0.9)
Bilirubin, Direct: 0.2 mg/dL (ref 0.1–0.5)
TOTAL PROTEIN: 6.1 g/dL — AB (ref 6.5–8.1)
Total Bilirubin: 0.6 mg/dL (ref 0.3–1.2)

## 2016-11-24 LAB — URINALYSIS, ROUTINE W REFLEX MICROSCOPIC
Bilirubin Urine: NEGATIVE
GLUCOSE, UA: NEGATIVE mg/dL
Hgb urine dipstick: NEGATIVE
Ketones, ur: NEGATIVE mg/dL
LEUKOCYTES UA: NEGATIVE
Nitrite: NEGATIVE
PROTEIN: NEGATIVE mg/dL
Specific Gravity, Urine: 1.006 (ref 1.005–1.030)
pH: 6 (ref 5.0–8.0)

## 2016-11-24 LAB — CBC WITH DIFFERENTIAL/PLATELET
Basophils Absolute: 0 10*3/uL (ref 0.0–0.1)
Basophils Relative: 1 %
EOS ABS: 0.2 10*3/uL (ref 0.0–0.7)
EOS PCT: 3 %
HCT: 40.1 % (ref 36.0–46.0)
Hemoglobin: 11.8 g/dL — ABNORMAL LOW (ref 12.0–15.0)
LYMPHS ABS: 1.8 10*3/uL (ref 0.7–4.0)
Lymphocytes Relative: 26 %
MCH: 23.1 pg — AB (ref 26.0–34.0)
MCHC: 29.4 g/dL — ABNORMAL LOW (ref 30.0–36.0)
MCV: 78.5 fL (ref 78.0–100.0)
Monocytes Absolute: 0.7 10*3/uL (ref 0.1–1.0)
Monocytes Relative: 10 %
Neutro Abs: 4.2 10*3/uL (ref 1.7–7.7)
Neutrophils Relative %: 60 %
PLATELETS: 197 10*3/uL (ref 150–400)
RBC: 5.11 MIL/uL (ref 3.87–5.11)
RDW: 17.1 % — ABNORMAL HIGH (ref 11.5–15.5)
WBC: 6.9 10*3/uL (ref 4.0–10.5)

## 2016-11-24 LAB — I-STAT CHEM 8, ED
BUN: 21 mg/dL — ABNORMAL HIGH (ref 6–20)
CALCIUM ION: 1.12 mmol/L — AB (ref 1.15–1.40)
Chloride: 98 mmol/L — ABNORMAL LOW (ref 101–111)
Creatinine, Ser: 1.2 mg/dL — ABNORMAL HIGH (ref 0.44–1.00)
GLUCOSE: 110 mg/dL — AB (ref 65–99)
HCT: 40 % (ref 36.0–46.0)
HEMOGLOBIN: 13.6 g/dL (ref 12.0–15.0)
Potassium: 3.7 mmol/L (ref 3.5–5.1)
Sodium: 140 mmol/L (ref 135–145)
TCO2: 32 mmol/L (ref 0–100)

## 2016-11-24 LAB — RAPID URINE DRUG SCREEN, HOSP PERFORMED
AMPHETAMINES: NOT DETECTED
BARBITURATES: NOT DETECTED
BENZODIAZEPINES: NOT DETECTED
COCAINE: NOT DETECTED
Opiates: NOT DETECTED
Tetrahydrocannabinol: NOT DETECTED

## 2016-11-24 LAB — AMMONIA: Ammonia: 18 umol/L (ref 9–35)

## 2016-11-24 MED ORDER — SODIUM CHLORIDE 0.9 % IV SOLN
INTRAVENOUS | Status: AC
  Administered 2016-11-24: 21:00:00 via INTRAVENOUS

## 2016-11-24 MED ORDER — ZOLPIDEM TARTRATE 5 MG PO TABS
5.0000 mg | ORAL_TABLET | Freq: Every day | ORAL | Status: DC
  Administered 2016-11-24: 5 mg via ORAL
  Filled 2016-11-24: qty 1

## 2016-11-24 MED ORDER — ALPRAZOLAM 0.25 MG PO TABS: 0.2500 mg | ORAL_TABLET | Freq: Two times a day (BID) | ORAL | Status: DC | PRN

## 2016-11-24 MED ORDER — PRAVASTATIN SODIUM 40 MG PO TABS
40.0000 mg | ORAL_TABLET | Freq: Every morning | ORAL | Status: DC
  Administered 2016-11-25: 40 mg via ORAL
  Filled 2016-11-24: qty 1

## 2016-11-24 MED ORDER — METOPROLOL TARTRATE 25 MG PO TABS
25.0000 mg | ORAL_TABLET | Freq: Two times a day (BID) | ORAL | Status: DC
  Administered 2016-11-25: 25 mg via ORAL
  Filled 2016-11-24 (×2): qty 1

## 2016-11-24 MED ORDER — RIVAROXABAN 15 MG PO TABS
15.0000 mg | ORAL_TABLET | Freq: Every day | ORAL | Status: DC
  Administered 2016-11-25: 15 mg via ORAL
  Filled 2016-11-24: qty 1

## 2016-11-24 MED ORDER — ISOSORBIDE MONONITRATE ER 30 MG PO TB24
60.0000 mg | ORAL_TABLET | Freq: Every day | ORAL | Status: DC
  Administered 2016-11-25: 60 mg via ORAL
  Filled 2016-11-24: qty 2

## 2016-11-24 MED ORDER — VENLAFAXINE HCL ER 150 MG PO CP24
150.0000 mg | ORAL_CAPSULE | Freq: Every day | ORAL | Status: DC
  Administered 2016-11-25: 150 mg via ORAL
  Filled 2016-11-24: qty 2
  Filled 2016-11-24 (×2): qty 1

## 2016-11-24 MED ORDER — ACETAMINOPHEN 650 MG RE SUPP: 650.0000 mg | Freq: Four times a day (QID) | RECTAL | Status: DC | PRN

## 2016-11-24 MED ORDER — ACETAMINOPHEN 325 MG PO TABS: 650.0000 mg | ORAL_TABLET | Freq: Four times a day (QID) | ORAL | Status: DC | PRN

## 2016-11-24 MED ORDER — SODIUM CHLORIDE 0.9 % IV SOLN: INTRAVENOUS | Status: DC

## 2016-11-24 MED ORDER — NITROGLYCERIN 0.4 MG SL SUBL: 0.4000 mg | SUBLINGUAL_TABLET | SUBLINGUAL | Status: DC | PRN

## 2016-11-24 NOTE — ED Provider Notes (Addendum)
[facility] DEPT Provider Note   CSN: [id number] Arrival date & time: 11/24/16  1520     History   Chief Complaint Chief Complaint  Patient presents with  . Stroke Symptoms    HPI Raven Gray is a 83 y.o. female.History is obtained from patient's adult children and from paramedics. Patient has no recall of event. Patient was reportedly sitting at 1:30 PM today when her eyes to the left, and she became weak on her right side. She also became transiently less responsive. Symptoms lasted 10 minutes resolve spontaneously without treatment. She is presently asymptomatic. No treatment prior to coming here. No other associated symptoms. Nothing makes symptoms better or worse.  HPI  Past Medical History:  Diagnosis Date  . Atrial fibrillation ([place])    a. on Xarelto  . Chest pain   . H/O echocardiogram    a. 08/2016: EF of 60-65%, severely dilated LA, mild pulmonic regurgitations, mild TR, and PA Pressure at 38 mm Hg  . History of depression   . History of DVT (deep vein thrombosis)   . History of pulmonary embolism   . Hyperlipidemia   . Hypertension   . Hypokalemia    Now improved with treatment  . Shortness of breath   . Stroke [facility])     Patient Active Problem List   Diagnosis Date Noted  . Dyspnea on exertion 10/29/2016  . Chronic respiratory failure with hypoxia ([place]) 10/29/2016  . Chronic anticoagulation 10/23/2016  . TIA (transient ischemic attack) 09/06/2016  . COPD (chronic obstructive pulmonary disease) ([place]) 09/06/2016  . Essential hypertension 09/06/2016  . GERD (gastroesophageal reflux disease) 09/06/2016  . Depression 09/06/2016  . Hyperlipidemia 10/23/2015  . Aortic insufficiency 12/15/2014  . Edema of both legs 09/19/2014  . Atrial fibrillation ([place]) 11/21/2013  . Chest pain 11/21/2013  . Community acquired pneumonia 06/19/2012  . Pulmonary embolism ([place]) 06/19/2012  . Hypokalemia 06/18/2012  . COPD with acute exacerbation ([place]) 06/18/2012  . Hypoxia  06/18/2012    Past Surgical History:  Procedure Laterality Date  . REPLACEMENT TOTAL KNEE    . ROTATOR CUFF REPAIR      OB History    No data available       Home Medications    Prior to Admission medications   Medication Sig Start Date End Date Taking? Authorizing Provider  acetaminophen (TYLENOL) 325 MG tablet Take 650 mg by mouth every 6 (six) hours as needed.    Historical Provider, MD  ALPRAZolam Duanne Moron) 0.25 MG tablet Take 1 tablet (0.25 mg total) by mouth 2 (two) times daily as needed for anxiety. 10/21/14   Maryann Mikhail, DO  furosemide (LASIX) 40 MG tablet Take 2 tablets (80 mg total) by mouth every morning. 10/29/16   Tanda Rockers, MD  isosorbide mononitrate (IMDUR) 60 MG 24 hr tablet Take 1 tablet (60 mg total) by mouth daily. 02/29/16   Lorretta Harp, MD  metoprolol (LOPRESSOR) 25 MG tablet Take 1 tablet (25 mg total) by mouth 2 (two) times daily. 06/20/14   Lorretta Harp, MD  Multiple Vitamins-Minerals (WOMENS 50+ [place] VITAMIN/MIN) TABS Take 1 tablet by mouth daily.    Historical Provider, MD  nitroGLYCERIN (NITROSTAT) 0.4 MG SL tablet Place 0.4 mg under the tongue as needed for chest pain. X 3 doses 02/08/16   Historical Provider, MD  omeprazole (PRILOSEC) 20 MG capsule Take 20 mg by mouth daily as needed (for reflux/heartburn).  04/23/15   Historical Provider, MD  oxybutynin (DITROPAN-XL) 5 MG  24 hr tablet Take 5 mg by mouth daily. 07/02/15   Historical Provider, MD  potassium chloride SA (K-DUR,KLOR-CON) 20 MEQ tablet Take 60 mEq by mouth every morning.     Historical Provider, MD  pravastatin (PRAVACHOL) 40 MG tablet Take 40 mg by mouth every morning.     Historical Provider, MD  Rivaroxaban (XARELTO) 15 MG TABS tablet Take 1 tablet (15 mg total) by mouth every morning. 09/08/16   Shanker Kristeen Mans, MD  venlafaxine XR (EFFEXOR-XR) 150 MG 24 hr capsule Take 150 mg by mouth daily.    Historical Provider, MD  zolpidem (AMBIEN) 5 MG tablet Take 5 mg by mouth at bedtime.  11/03/13   Historical Provider, MD    Family History Family History  Problem Relation Age of Onset  . Heart attack Father   . Diabetes type II Daughter   . Heart disease Daughter     stents    Social History Social History  Substance Use Topics  . Smoking status: Never Smoker  . Smokeless tobacco: Never Used  . Alcohol use No     Allergies   Patient has no known allergies.   Review of Systems Review of Systems  Musculoskeletal: Positive for gait problem.       Walks with walker  Neurological: Positive for weakness.  All other systems reviewed and are negative.    Physical Exam Updated Vital Signs BP 157/86 (BP Location: Right Arm)   Pulse 84   Temp 97.8 F (36.6 C) (Oral)   Resp 17   SpO2 99%   Physical Exam  Constitutional: She appears well-developed and well-nourished.  HENT:  Head: Normocephalic and atraumatic.  Eyes: Conjunctivae are normal. Pupils are equal, round, and reactive to light.  Neck: Neck supple. No tracheal deviation present. No thyromegaly present.  Cardiovascular: Normal rate.   No murmur heard. Irregularly irregular  Pulmonary/Chest: Effort normal and breath sounds normal.  Abdominal: Soft. Bowel sounds are normal. She exhibits no distension. There is no tenderness.  Musculoskeletal: Normal range of motion. She exhibits edema. She exhibits no tenderness.  1+ pretibial pitting edema bilaterally  Neurological: She is alert. Coordination normal.  Strength 5 over 5 overall cranial nerves II through XII grossly intact. DTRs symmetric bilaterally at knee jerk ankle jerk and biceps toes downward going bilaterally  oriented to name and hospital, states date is March 1987.  Skin: Skin is warm and dry. No rash noted.  Psychiatric: She has a normal mood and affect.  Nursing note and vitals reviewed.    ED Treatments / Results  Labs (all labs ordered are listed, but only abnormal results are displayed) Labs Reviewed  RAPID URINE DRUG SCREEN,  HOSP PERFORMED  CBC WITH DIFFERENTIAL/PLATELET  I-STAT CHEM 8, ED    EKG  EKG Interpretation  Date/Time:  Monday November 24 2016 15:35:13 EST Ventricular Rate:  60 PR Interval:    QRS Duration: 104 QT Interval:  400 QTC Calculation: 400 R Axis:   75 Text Interpretation:  Atrial fibrillation Multiple ventricular premature complexes Borderline repolarization abnormality No significant change since last tracing Confirmed by Winfred Leeds  MD, Kennita Pavlovich [phone]) on 11/24/2016 3:55:13 PM       Radiology No results found.  Procedures Procedures (including critical care time)  Medications Ordered in ED Medications - No data to display   Initial Impression / Assessment and Plan / ED Course  I have reviewed the triage vital signs and the nursing notes.  Pertinent labs & imaging results that were  available during my care of the patient were reviewed by me and considered in my medical decision making (see chart for details).     Results for orders placed or performed during the hospital encounter of 11/24/16  CBC with Differential/Platelet  Result Value Ref Range   WBC 6.9 4.0 - 10.5 K/uL   RBC 5.11 3.87 - 5.11 MIL/uL   Hemoglobin 11.8 (L) 12.0 - 15.0 g/dL   HCT 40.1 36.0 - 46.0 %   MCV 78.5 78.0 - 100.0 fL   MCH 23.1 (L) 26.0 - 34.0 pg   MCHC 29.4 (L) 30.0 - 36.0 g/dL   RDW 17.1 (H) 11.5 - 15.5 %   Platelets 197 150 - 400 K/uL   Neutrophils Relative % 60 %   Neutro Abs 4.2 1.7 - 7.7 K/uL   Lymphocytes Relative 26 %   Lymphs Abs 1.8 0.7 - 4.0 K/uL   Monocytes Relative 10 %   Monocytes Absolute 0.7 0.1 - 1.0 K/uL   Eosinophils Relative 3 %   Eosinophils Absolute 0.2 0.0 - 0.7 K/uL   Basophils Relative 1 %   Basophils Absolute 0.0 0.0 - 0.1 K/uL  Urinalysis, Routine w reflex microscopic  Result Value Ref Range   Color, Urine STRAW (A) YELLOW   APPearance CLEAR CLEAR   Specific Gravity, Urine 1.006 1.005 - 1.030   pH 6.0 5.0 - 8.0   Glucose, UA NEGATIVE NEGATIVE mg/dL   Hgb urine  dipstick NEGATIVE NEGATIVE   Bilirubin Urine NEGATIVE NEGATIVE   Ketones, ur NEGATIVE NEGATIVE mg/dL   Protein, ur NEGATIVE NEGATIVE mg/dL   Nitrite NEGATIVE NEGATIVE   Leukocytes, UA NEGATIVE NEGATIVE  I-stat chem 8, ed  Result Value Ref Range   Sodium 140 135 - 145 mmol/L   Potassium 3.7 3.5 - 5.1 mmol/L   Chloride 98 (L) 101 - 111 mmol/L   BUN 21 (H) 6 - 20 mg/dL   Creatinine, Ser 1.20 (H) 0.44 - 1.00 mg/dL   Glucose, Bld 110 (H) 65 - 99 mg/dL   Calcium, Ion 1.12 (L) 1.15 - 1.40 mmol/L   TCO2 32 0 - 100 mmol/L   Hemoglobin 13.6 12.0 - 15.0 g/dL   HCT 40.0 36.0 - 46.0 %   Dg Chest 2 View  Result Date: 10/29/2016 CLINICAL DATA:  Chronic dyspnea. EXAM: CHEST  2 VIEW COMPARISON:  Radiographs of October 20, 2014. FINDINGS: Stable cardiomediastinal silhouette. Atherosclerosis of thoracic aorta is noted. No pneumothorax or pleural effusion is noted. Stable large hiatal hernia is noted. No acute pulmonary disease is noted. Postsurgical changes are seen involving the right shoulder. IMPRESSION: Aortic atherosclerosis. Stable large hiatal hernia. No acute cardiopulmonary abnormality seen. Electronically Signed   By: Marijo Conception, M.D.   On: 10/29/2016 15:58    I consulted Dr.Oster, neurologist who will evaluate patient in ED. Dr. [name] evaluate patient in ED. Feels that patient is encephalopathic rather than TIA or strokelike symptoms. He does not feel the patient needs imaging of the brain Dr Penni Homans and made arrangements for overnight stay. Will see patient in hospital Final Clinical Impressions(s) / ED Diagnoses  Dx acute encephalopathy Final diagnoses:  None    New Prescriptions New Prescriptions   No medications on file     Orlie Dakin, MD 11/24/16 1806    Orlie Dakin, MD 11/24/16 [id number]

## 2016-11-24 NOTE — H&P (Addendum)
History and Physical    Raven Gray [id number] DOB: Jul 05, 1928 DOA: 11/24/2016  PCP: [name], MD  Patient coming from: Home  Chief Complaint: Confusion  HPI: Raven Gray is a 83 y.o. female with medical history significant of afib on xarelto, hx of DVT/PE, HTN, TIA followed by Dr. [name] who presents to the ED with sudden confusion and staring to the left with decreased level of responsiveness while at dinner table. Symptoms reportedly lasted around 31min. Patient attempted to ambulate to another room, but found that her legs "almost gave out on me" with increased generalized weakness. Following event and while in ED, patient noted to be more confused than normal.  On further questioning, patient reports PCP recently increased lasix dose to 80mg  daily two weeks prior to admission for LE edema. During this time, patient reported ample urination and developing thirst. At present, patient reports feeling quite thirsty and is asking for something to drink  ED Course: In the ED, Neurology was consulted by EDP who reportedly did not recommend intracranial imaging. Cr was noted to be 1.2 (was 1.16 on 2/1), BUN 21, UA was found to be unremarkable, pt afebtrile without leukocytosis. Given concerns of encephalopathy, hospitalist consulted for consideration for admission.  Review of Systems:  Review of Systems  Constitutional: Negative for chills, fever and malaise/fatigue.  HENT: Negative for congestion, ear discharge and nosebleeds.   Eyes: Negative for double vision, photophobia and discharge.  Respiratory: Negative for hemoptysis, shortness of breath and wheezing.   Cardiovascular: Negative for palpitations, orthopnea and claudication.  Gastrointestinal: Negative for abdominal pain, heartburn, nausea and vomiting.  Genitourinary: Negative for frequency, hematuria and urgency.  Musculoskeletal: Negative for back pain, falls and neck pain.  Neurological: Negative for tingling and  headaches.  Psychiatric/Behavioral: Negative for hallucinations and memory loss. The patient is not nervous/anxious.     Past Medical History:  Diagnosis Date  . Atrial fibrillation ([place])    a. on Xarelto  . Chest pain   . H/O echocardiogram    a. 08/2016: EF of 60-65%, severely dilated LA, mild pulmonic regurgitations, mild TR, and PA Pressure at 38 mm Hg  . History of depression   . History of DVT (deep vein thrombosis)   . History of pulmonary embolism   . Hyperlipidemia   . Hypertension   . Hypokalemia    Now improved with treatment  . Shortness of breath   . Stroke [facility])     Past Surgical History:  Procedure Laterality Date  . REPLACEMENT TOTAL KNEE    . ROTATOR CUFF REPAIR       reports that she has never smoked. She has never used smokeless tobacco. She reports that she does not drink alcohol or use drugs.  No Known Allergies  Family History  Problem Relation Age of Onset  . Heart attack Father   . Diabetes type II Daughter   . Heart disease Daughter     stents    Prior to Admission medications   Medication Sig Start Date End Date Taking? Authorizing Provider  acetaminophen (TYLENOL) 325 MG tablet Take 650 mg by mouth every 6 (six) hours as needed.    Historical Provider, MD  ALPRAZolam Duanne Moron) 0.25 MG tablet Take 1 tablet (0.25 mg total) by mouth 2 (two) times daily as needed for anxiety. 10/21/14   Maryann Mikhail, DO  furosemide (LASIX) 40 MG tablet Take 2 tablets (80 mg total) by mouth every morning. 10/29/16   Tanda Rockers, MD  isosorbide mononitrate (IMDUR) 60 MG 24 hr tablet Take 1 tablet (60 mg total) by mouth daily. 02/29/16   Lorretta Harp, MD  metoprolol (LOPRESSOR) 25 MG tablet Take 1 tablet (25 mg total) by mouth 2 (two) times daily. 06/20/14   Lorretta Harp, MD  Multiple Vitamins-Minerals (WOMENS 50+ [place] VITAMIN/MIN) TABS Take 1 tablet by mouth daily.    Historical Provider, MD  nitroGLYCERIN (NITROSTAT) 0.4 MG SL tablet Place 0.4 mg under the  tongue as needed for chest pain. X 3 doses 02/08/16   Historical Provider, MD  omeprazole (PRILOSEC) 20 MG capsule Take 20 mg by mouth daily as needed (for reflux/heartburn).  04/23/15   Historical Provider, MD  oxybutynin (DITROPAN-XL) 5 MG 24 hr tablet Take 5 mg by mouth daily. 07/02/15   Historical Provider, MD  potassium chloride SA (K-DUR,KLOR-CON) 20 MEQ tablet Take 60 mEq by mouth every morning.     Historical Provider, MD  pravastatin (PRAVACHOL) 40 MG tablet Take 40 mg by mouth every morning.     Historical Provider, MD  Rivaroxaban (XARELTO) 15 MG TABS tablet Take 1 tablet (15 mg total) by mouth every morning. 09/08/16   Shanker Kristeen Mans, MD  venlafaxine XR (EFFEXOR-XR) 150 MG 24 hr capsule Take 150 mg by mouth daily.    Historical Provider, MD  zolpidem (AMBIEN) 5 MG tablet Take 5 mg by mouth at bedtime. 11/03/13   Historical Provider, MD    Physical Exam: Vitals:   11/24/16 1539 11/24/16 1724 11/24/16 1730 11/24/16 1745  BP: 157/86 141/82  108/56  Pulse: 84 68    Resp: 17 18 16 22   Temp: 97.8 F (36.6 C)     TempSrc: Oral     SpO2: 99% 100% 96%     Constitutional: NAD, calm, comfortable Vitals:   11/24/16 1539 11/24/16 1724 11/24/16 1730 11/24/16 1745  BP: 157/86 141/82  108/56  Pulse: 84 68    Resp: 17 18 16 22   Temp: 97.8 F (36.6 C)     TempSrc: Oral     SpO2: 99% 100% 96%    Eyes: PERRL, lids and conjunctivae normal ENMT: Mucous membranes are dry. Posterior pharynx clear of any exudate or lesions.Normal dentition.  Neck: normal, supple, no masses, no thyromegaly Respiratory: clear to auscultation bilaterally, no wheezing, no crackles. Normal respiratory effort. No accessory muscle use.  Cardiovascular: Regular rate and rhythm, no murmurs Abdomen: no tenderness, no masses palpated. No hepatosplenomegaly. Bowel sounds positive.  Musculoskeletal: no clubbing / cyanosis. No joint deformity upper and lower extremities. Good ROM, no contractures. Normal muscle tone.    Skin: decreased skin turgor, no rashes, lesions, ulcers. No induration Neurologic: CN 2-12 grossly intact. Sensation intact, DTR normal. Strength 5/5 in all 4.  Psychiatric: Normal judgment and insight. Alert and oriented x 3. Normal mood.    Labs on Admission: I have personally reviewed following labs and imaging studies  CBC:  Recent Labs Lab 11/24/16 1631 11/24/16 1638  WBC 6.9  --   NEUTROABS 4.2  --   HGB 11.8* 13.6  HCT 40.1 40.0  MCV 78.5  --   PLT 197  --    Basic Metabolic Panel:  Recent Labs Lab 11/24/16 1638  NA 140  K 3.7  CL 98*  GLUCOSE 110*  BUN 21*  CREATININE 1.20*   GFR: Estimated Creatinine Clearance: 34.5 mL/min (by C-G formula based on SCr of 1.2 mg/dL (H)). Liver Function Tests: No results for input(s): AST, ALT, ALKPHOS, BILITOT, PROT, ALBUMIN  in the last 168 hours. No results for input(s): LIPASE, AMYLASE in the last 168 hours. No results for input(s): AMMONIA in the last 168 hours. Coagulation Profile: No results for input(s): INR, PROTIME in the last 168 hours. Cardiac Enzymes: No results for input(s): CKTOTAL, CKMB, CKMBINDEX, TROPONINI in the last 168 hours. BNP (last 3 results) No results for input(s): PROBNP in the last 8760 hours. HbA1C: No results for input(s): HGBA1C in the last 72 hours. CBG: No results for input(s): GLUCAP in the last 168 hours. Lipid Profile: No results for input(s): CHOL, HDL, LDLCALC, TRIG, CHOLHDL, LDLDIRECT in the last 72 hours. Thyroid Function Tests: No results for input(s): TSH, T4TOTAL, FREET4, T3FREE, THYROIDAB in the last 72 hours. Anemia Panel: No results for input(s): VITAMINB12, FOLATE, FERRITIN, TIBC, IRON, RETICCTPCT in the last 72 hours. Urine analysis:    Component Value Date/Time   COLORURINE STRAW (A) 11/24/2016 1720   APPEARANCEUR CLEAR 11/24/2016 1720   LABSPEC 1.006 11/24/2016 1720   PHURINE 6.0 11/24/2016 1720   GLUCOSEU NEGATIVE 11/24/2016 1720   HGBUR NEGATIVE 11/24/2016 1720    BILIRUBINUR NEGATIVE 11/24/2016 1720   KETONESUR NEGATIVE 11/24/2016 1720   PROTEINUR NEGATIVE 11/24/2016 1720   UROBILINOGEN 1.0 10/20/2014 1614   NITRITE NEGATIVE 11/24/2016 1720   LEUKOCYTESUR NEGATIVE 11/24/2016 1720   Sepsis Labs: !!!!!!!!!!!!!!!!!!!!!!!!!!!!!!!!!!!!!!!!!!!! @LABRCNTIP (procalcitonin:4,lacticidven:4) )No results found for this or any previous visit (from the past 240 hour(s)).   Radiological Exams on Admission: No results found.  EKG: Independently reviewed. Rate controlled afib  Assessment/Plan Active Problems:   COPD with acute exacerbation (HCC)   Hyperlipidemia   TIA (transient ischemic attack)   Essential hypertension   Chronic anticoagulation   Encephalopathy   1. Toxic-metabolic Encephalopathy 1. Neurology consulted in ED, who does not recommend full stroke work up at this time 2. Given concurrent anticoagulation, will order head ct w/o contrast to r/o intracranial bleed 3. Neurology has recommended EEG, ammonia, ABG 4. Will order EEG. Ammonia pending. Pt currently on minimal O2 support with no wheezing on exam, thus will hold off on ABG at this time 5. Plan to minimize sedating medications if possible 6. Have asked ED to obtain orthostatic vitals as pt is on daily lasix with BUN that is higher than previous level 7. Pt clinically appears dehydrated. Will continue patient on gentle IVF overnight 8. UA reviewed and is unremarkable 9. Follow up bmet in AM 10. Place in observation status, medsurg 2. HLD 1. Stable at present 2. Cont home meds 3. Hx TIA 1. Currently stable 2. Recent stroke work up done in 12/17 3. Neurology consulted in the ED, no recommendations to repeat stroke w/u 4. Will monitor for now 4. HTN 1. BP currently stable and controlled 2. Would continue home meds 5. Hx COPD 1. Stable at this time on RA 2. No audible wheezing on exam 6. afib 1. Rate controlled 2. Continue xarelto for secondary stroke prevention 3. Repeat  cbc in AM 7. Chronic anticoagulation 1. Continue xarelto per above 2. No signs of active bleeding at this time  DVT prophylaxis: Xarelto  Code Status: Full Family Communication: Pt in room  Disposition Plan: Uncertain at this time  Consults called: Neurology consulted in ED Admission status: observation status as it may take less than 2 midnights to stabilize pt   Deeanna Beightol, Orpah Melter MD [facility] Pager [phone]  If 7PM-7AM, please contact night-coverage [email or web address] Password [facility]  11/24/2016, 5:56 PM

## 2016-11-24 NOTE — ED Triage Notes (Signed)
Pt arrived by [facility] from home. Pt was cooking at 1330, onset at 1345 pt seemed to "fall asleep" pt was woken up and had left side gaze and right side droop. Resolved prior to ems arrival.

## 2016-11-24 NOTE — Consult Note (Signed)
Neurology Consult Note  Reason for Consultation: ? Stroke vs TIA  Requesting provider: Winfred Leeds  CC: Patient is without complaint  HPI: History is obtained from the patient is a fair historian. Additional information is obtained from her daughters at the bedside as the patient presently denies any specific concerns.  Her daughter reports that she was at home in her usual state of health earlier today when she suddenly seemed to become confused. She was trying to help her mother to another room or when she noticed that she seemed to be weak in both sides and "nearly collapsed" a couple of times. She felt as if there may have been some "twisting" of the right side of her mouth. This was very brief. Symptoms have since resolved by the time EMS arrived. However, they report that the patient has been somewhat agitated and has been talking nonstop since this episode which is unusual for her. This prompted them to bring her to the emergency department for further evaluation. The patient insists that she is fine and wants to go home. She presently denies any concerns.  Of note, she was recently admitted to [facility] in December 2017 after a similar episode. At that time, however, symptoms are much more severe than they were today. She was admitted for workup for TIA and stroke with testing as follows:  MRI brain 09/06/16: No acute stroke; chronic small vessel changes in cerebellum, pons, and hemispheric white matter; old infarcts bilateral thalami and basal ganglia  MRA head 09/06/16: Focal 50% stenosis L distal M1  TTE 09/07/16: EF 60-65%; L atrium severely dilated  Carotid Dopplers 09/07/16: No significant stenosis  EEG showed no seizures  a1c 6.2, LDL 52  She was seen in consultation by Stroke MD [name]) who noted that she was not taking her Xarelto correctly (not taking at consistent time and taking on empty stomach). She was told to take it at the same time each day with meals. Diagnosed  with TIA. She followed up with Dr. [name] in the office on 11/18/16, no change to her management.    PMH:  Past Medical History:  Diagnosis Date  . Atrial fibrillation ([place])    a. on Xarelto  . Chest pain   . H/O echocardiogram    a. 08/2016: EF of 60-65%, severely dilated LA, mild pulmonic regurgitations, mild TR, and PA Pressure at 38 mm Hg  . History of depression   . History of DVT (deep vein thrombosis)   . History of pulmonary embolism   . Hyperlipidemia   . Hypertension   . Hypokalemia    Now improved with treatment  . Shortness of breath   . Stroke [facility])     PSH:  Past Surgical History:  Procedure Laterality Date  . REPLACEMENT TOTAL KNEE    . ROTATOR CUFF REPAIR      Family history: Family History  Problem Relation Age of Onset  . Heart attack Father   . Diabetes type II Daughter   . Heart disease Daughter     stents    Social history:  Social History   Social History  . Marital status: Widowed    Spouse name: N/A  . Number of children: N/A  . Years of education: N/A   Occupational History  . Not on file.   Social History Main Topics  . Smoking status: Never Smoker  . Smokeless tobacco: Never Used  . Alcohol use No  . Drug use: No  . Sexual activity: No  Other Topics Concern  . Not on file   Social History Narrative  . No narrative on file    Current outpatient meds: Medications reviewed and reconciled.  No outpatient prescriptions have been marked as taking for the 11/24/16 encounter [facility] Encounter).    Current inpatient meds: Medications reviewed and reconciled.  No current facility-administered medications for this encounter.    Current Outpatient Prescriptions  Medication Sig Dispense Refill  . acetaminophen (TYLENOL) 325 MG tablet Take 650 mg by mouth every 6 (six) hours as needed.    . ALPRAZolam (XANAX) 0.25 MG tablet Take 1 tablet (0.25 mg total) by mouth 2 (two) times daily as needed for anxiety. 30 tablet 0  . furosemide  (LASIX) 40 MG tablet Take 2 tablets (80 mg total) by mouth every morning.    . isosorbide mononitrate (IMDUR) 60 MG 24 hr tablet Take 1 tablet (60 mg total) by mouth daily. 90 tablet 3  . metoprolol (LOPRESSOR) 25 MG tablet Take 1 tablet (25 mg total) by mouth 2 (two) times daily. 180 tablet 3  . Multiple Vitamins-Minerals (WOMENS 50+ MULTI VITAMIN/MIN) TABS Take 1 tablet by mouth daily.    . nitroGLYCERIN (NITROSTAT) 0.4 MG SL tablet Place 0.4 mg under the tongue as needed for chest pain. X 3 doses  0  . omeprazole (PRILOSEC) 20 MG capsule Take 20 mg by mouth daily as needed (for reflux/heartburn).   11  . oxybutynin (DITROPAN-XL) 5 MG 24 hr tablet Take 5 mg by mouth daily.  0  . potassium chloride SA (K-DUR,KLOR-CON) 20 MEQ tablet Take 60 mEq by mouth every morning.     . pravastatin (PRAVACHOL) 40 MG tablet Take 40 mg by mouth every morning.     . Rivaroxaban (XARELTO) 15 MG TABS tablet Take 1 tablet (15 mg total) by mouth every morning. 30 tablet 0  . venlafaxine XR (EFFEXOR-XR) 150 MG 24 hr capsule Take 150 mg by mouth daily.    Marland Kitchen zolpidem (AMBIEN) 5 MG tablet Take 5 mg by mouth at bedtime.      Allergies: No Known Allergies  ROS: As per HPI. A full 14-point review of systems was performed and is otherwise unremarkable.   PE:  BP 157/86 (BP Location: Right Arm)   Pulse 84   Temp 97.8 F (36.6 C) (Oral)   Resp 17   SpO2 99%   General: WDWN, no acute distress. AAO To all but the date and year, which her daughter says she would normally be able state. Speech clear, no dysarthria. No aphasia. Follows commands briskly. Affect is bright with congruent mood. Comportment is normal. She is very talkative and at times a little bit pressured. However, she can be easily redirected. HEENT: Normocephalic. Neck supple without LAD. MMM, OP clear. Dentition good. Sclerae anicteric. No conjunctival injection.  CV: Irregular, no murmur. Carotid pulses full and symmetric, no bruits. Distal pulses 2+  and symmetric.  Lungs: CTAB.  Abdomen: Soft, non-distended, non-tender. Bowel sounds present x4.  Extremities: No C/C/E. Neuro:  CN: Pupils are equal and round. They are symmetrically reactive from 3-->2 mm. EOMI without nystagmus. There is some breakup of smooth pursuit in all directions of gaze. No reported diplopia. Facial sensation is intact to light touch. Face is symmetric at rest with normal strength and mobility. Hearing is intact to conversational voice. Palate elevates symmetrically and uvula is midline. Voice is normal in tone, pitch and quality. Bilateral SCM and trapezii are 5/5. Tongue is midline with normal bulk and  mobility.  Motor: Normal bulk, tone, and strength with the exception of 4+/5 strength in both hip flexors. No tremor or other abnormal movements. No drift.  Sensation: Intact to light touch.  DTRs: 2+, symmetric. Toes downgoing bilaterally. No pathologic reflexes.  Coordination: Finger-to-nose is without dysmetria. Finger taps are normal in amplitude and speed, no decrement.  Gait: Deferred as the patient reports that she uses a walker or wheelchair at all times for ambulation.  Labs:  Lab Results  Component Value Date   WBC 6.8 09/06/2016   HGB 12.6 09/06/2016   HCT 37.0 09/06/2016   PLT 253 09/06/2016   GLUCOSE 71 10/23/2016   CHOL 106 09/07/2016   TRIG 56 09/07/2016   HDL 43 09/07/2016   LDLCALC 52 09/07/2016   ALT 11 (L) 09/06/2016   AST 20 09/06/2016   NA 140 10/23/2016   K 4.5 10/23/2016   CL 103 10/23/2016   CREATININE 1.16 (H) 10/23/2016   BUN 14 10/23/2016   CO2 26 10/23/2016   TSH 1.786 10/20/2014   INR 2.06 09/06/2016   HGBA1C 6.2 (H) 09/07/2016   Urinalysis pending Urine drug screen pending Liver function test pending Serum ammonia pending  Imaging:  No imaging for review  Assessment and Plan:  1. Acute encephalopathy: At this time, she appears to be mildly encephalopathic with some disorientation to time and some mild agitation and  pressured speech which are different from her baseline according to family. The etiology of her alteration of mental status is somewhat unclear. Consideration should be given to possible seizure, and would consider EEG. Given ambiguity of presentation, will hold on antiepileptic drug therapy for now pending EEG results. Continue with metabolic workup and urinalysis is pending as is serum ammonia and liver function testing. Recommend checking ABG as well.  Optimize metabolic status as you are. Continue to treat any underlying infection. Minimize the use of opiates, benzos or any medication with strong anticholinergic properties as much as possible. Optimize sleep-wake cycles as much as you can by keeping the room bright with activity during the day and dark and quiet at night. For agitation, recommend low-dose haloperidol or an atypical antipsychotic.   2. Possible TIA: Her daughter does report that she had some possible facial droop in the setting of this episode. Again, TIA is possible must also consider potential seizure. Her known risk factors for stroke and cerebrovascular disease would include atrial fibrillation, hypertension, hyperlipidemia, history of TIA, and age. Given that she just recently had complete workup for TIA/stroke, I do not think it is likely to be of benefit to repeat these tests. She is already on optimal medical management with Xarelto so it would be unlikely to change management in any meaningful way. Recommend checking EEG has noted above. This can be done as an outpatient if she is discharged from the ED. Continue Xarelto and risk factor modification.  3. Right facial weakness: This is described by the patient's daughter. No deficits are appreciated on her present examination. Main considerations as above with TIA and focal seizure seemingly the strongest possibilities. No intervention as this has resolved.  4. Abnormality of gait: She chronically uses a walker and wheelchair for  ambulation. No acute changes.  The patient has no acute deficits is likely of a role for acute rehabilitation services at this time.  Fall risk: Risk factors for falls include age, prior history of falls, female gender, and impaired mobility/gait. Patient was counseled not to get up without assistance. Limit psychoactive medications  and sedating medications.  Delirium risk: Risk factors for delirium include age, impaired ADLs at baseline, immobility. Continue to optimize metabolic status as you are. Continue to treat any underlying infection. Minimize the use of opiates, benzos or any medication with strong anticholinergic properties as much as possible. Optimize sleep-wake cycles as much as you can by keeping the room bright with activity during the day and dark and quiet at night.   Needs outpatient neurology follow-up?: Can follow up with Dr. [name] after discharge.  This was discussed with the patient and and her family. Education was provided on the diagnosis and expected evaluation and treatment. They are in agreement with the plan as noted. They were given the opportunity to ask any questions and these were addressed to their satisfaction.   I also spoke with the ED attending, Dr. Winfred Leeds, to relay the above impression and plan.

## 2016-11-25 ENCOUNTER — Telehealth: Payer: Self-pay | Admitting: Cardiovascular Disease

## 2016-11-25 ENCOUNTER — Observation Stay (HOSPITAL_BASED_OUTPATIENT_CLINIC_OR_DEPARTMENT_OTHER)
Admit: 2016-11-25 | Discharge: 2016-11-25 | Disposition: A | Discharge status: Home / Self Care | Payer: Medicare Other | Attending: Internal Medicine | Admitting: Internal Medicine

## 2016-11-25 ENCOUNTER — Observation Stay (HOSPITAL_COMMUNITY): Payer: Medicare Other

## 2016-11-25 DIAGNOSIS — G9340 Encephalopathy, unspecified: Secondary | ICD-10-CM | POA: Insufficient documentation

## 2016-11-25 DIAGNOSIS — I10 Essential (primary) hypertension: Secondary | ICD-10-CM | POA: Diagnosis not present

## 2016-11-25 DIAGNOSIS — G459 Transient cerebral ischemic attack, unspecified: Secondary | ICD-10-CM

## 2016-11-25 DIAGNOSIS — E785 Hyperlipidemia, unspecified: Secondary | ICD-10-CM

## 2016-11-25 DIAGNOSIS — E860 Dehydration: Secondary | ICD-10-CM | POA: Diagnosis not present

## 2016-11-25 DIAGNOSIS — R0602 Shortness of breath: Secondary | ICD-10-CM | POA: Diagnosis not present

## 2016-11-25 LAB — COMPREHENSIVE METABOLIC PANEL
ALT: 9 U/L — AB (ref 14–54)
AST: 20 U/L (ref 15–41)
Albumin: 3.1 g/dL — ABNORMAL LOW (ref 3.5–5.0)
Alkaline Phosphatase: 69 U/L (ref 38–126)
Anion gap: 6 (ref 5–15)
BILIRUBIN TOTAL: 0.6 mg/dL (ref 0.3–1.2)
BUN: 13 mg/dL (ref 6–20)
CHLORIDE: 103 mmol/L (ref 101–111)
CO2: 31 mmol/L (ref 22–32)
CREATININE: 0.95 mg/dL (ref 0.44–1.00)
Calcium: 8.6 mg/dL — ABNORMAL LOW (ref 8.9–10.3)
GFR, EST NON AFRICAN AMERICAN: 53 mL/min — AB (ref >60)
Glucose, Bld: 120 mg/dL — ABNORMAL HIGH (ref 65–99)
POTASSIUM: 3.4 mmol/L — AB (ref 3.5–5.1)
Sodium: 140 mmol/L (ref 135–145)
Total Protein: 5.9 g/dL — ABNORMAL LOW (ref 6.5–8.1)

## 2016-11-25 LAB — CBC
HCT: 37.6 % (ref 36.0–46.0)
Hemoglobin: 10.9 g/dL — ABNORMAL LOW (ref 12.0–15.0)
MCH: 22.7 pg — ABNORMAL LOW (ref 26.0–34.0)
MCHC: 29 g/dL — ABNORMAL LOW (ref 30.0–36.0)
MCV: 78.3 fL (ref 78.0–100.0)
PLATELETS: 186 10*3/uL (ref 150–400)
RBC: 4.8 MIL/uL (ref 3.87–5.11)
RDW: 17 % — AB (ref 11.5–15.5)
WBC: 5.6 10*3/uL (ref 4.0–10.5)

## 2016-11-25 MED ORDER — FUROSEMIDE 40 MG PO TABS
60.0000 mg | ORAL_TABLET | Freq: Every morning | ORAL | Status: DC
Start: 2016-11-27 — End: 2017-01-10

## 2016-11-25 MED ORDER — POTASSIUM CHLORIDE CRYS ER 20 MEQ PO TBCR
40.0000 meq | EXTENDED_RELEASE_TABLET | Freq: Once | ORAL | Status: AC
  Administered 2016-11-25: 40 meq via ORAL
  Filled 2016-11-25: qty 2

## 2016-11-25 NOTE — Care Management Note (Signed)
Case Management Note  Patient Details  Name: Raven Gray MRN: [id number] Date of Birth: 14-Apr-1928  Subjective/Objective:                    Action/Plan: Pt discharging home with [facility] services. CM met with the patient and her daughter and provided a list of [facility] agencies. Raven Gray (daughter) selected [facility]. Raven Gray with [facility] notified and accepted the referral. Raven Gray's number given to Raven Gray: [phone]. Raven Gray to provide transportation home.   Expected Discharge Date:  11/25/16               Expected Discharge Plan:  [place]  In-House Referral:     Discharge planning Services  CM Consult  Post Acute Care Choice:  Home Health Choice offered to:  Patient, Adult Children  DME Arranged:    DME Agency:     HH Arranged:  PT, OT HH Agency:  [place]  Status of Service:  Completed, signed off  If discussed at [place] of Stay Meetings, dates discussed:    Additional Comments:  Raven Friar, RN 11/25/2016, 7:58 PM

## 2016-11-25 NOTE — Telephone Encounter (Signed)
[facility] at [facility] is calling she is asking if Dr Gwenlyn Found will want to sign for PT and OT for home health? Pt is in the hospital now to be discharged probably tomorrow. Ok?

## 2016-11-25 NOTE — Evaluation (Signed)
Physical Therapy Evaluation Patient Details Name: Raven Gray MRN: [id number] DOB: 1928/02/05 Today's Date: 11/25/2016   History of Present Illness  Pt is an 83 y.o. female who presented to the ED with sudden confusion and staring to the L with decreased responsiveness as well as weakness on ambulation. She has a PMH significant for afib on xarelto, hx of DVT/PE, HTN, TIA followed by Dr. [name].  Clinical Impression  Patient presents with decreased strength and balance limiting independence with mobility.  She will benefit from follow up HHPT at d/c.  Will have assist as needed.  Daughter usually there 10-3 daily.  PT to follow acutely.    Follow Up Recommendations Home health PT    Equipment Recommendations  None recommended by PT    Recommendations for Other Services       Precautions / Restrictions Precautions Precautions: Fall Restrictions Weight Bearing Restrictions: No      Mobility  Bed Mobility Overal bed mobility: Needs Assistance Bed Mobility: Supine to Sit     Supine to sit: Modified independent (Device/Increase time)     General bed mobility comments: HOB elevated  Transfers Overall transfer level: Needs assistance Equipment used: Rolling walker (2 wheeled) Transfers: Sit to/from Stand Sit to Stand: Min guard         General transfer comment: Min guard assist for safety.  Ambulation/Gait Ambulation/Gait assistance: Min guard Ambulation Distance (Feet): 80 Feet (x 2) Assistive device: Rolling walker (2 wheeled) Gait Pattern/deviations: Step-through pattern;Decreased stride length;Trunk flexed;Shuffle     General Gait Details: tends to push walker too far out and speeds up as getting close to sitting in chair, cues for safety with reaching back for seat and slowing down; managing O2 line  Stairs            Wheelchair Mobility    Modified Rankin (Stroke Patients Only) Modified Rankin (Stroke Patients Only) Pre-Morbid Rankin Score: Moderate  disability Modified Rankin: Moderately severe disability     Balance Overall balance assessment: Needs assistance Sitting-balance support: No upper extremity supported Sitting balance-Leahy Scale: Good     Standing balance support: Bilateral upper extremity supported;No upper extremity supported;During functional activity Standing balance-Leahy Scale: Poor Standing balance comment: UE support for balance                             Pertinent Vitals/Pain Pain Assessment: No/denies pain    Home Living Family/patient expects to be discharged to:: Private residence Living Arrangements: Alone Available Help at Discharge: Family;Available 24 hours/day Type of Home: House Home Access: Ramped entrance     Home Layout: One level Home Equipment: Walker - 2 wheels;Wheelchair - manual;Cane - single point;Bedside commode      Prior Function Level of Independence: Independent with assistive device(s)         Comments: Uses RW or w/c for mobility. She also reports using cane at times, but per daughter, her family has removed most of the canes in order to improve safety.     Hand Dominance   Dominant Hand: Right    Extremity/Trunk Assessment   Upper Extremity Assessment Upper Extremity Assessment: Defer to OT evaluation RUE Deficits / Details: Decreased active shoulder ROM due to previous rotator cuff injury.    Lower Extremity Assessment Lower Extremity Assessment: Overall WFL for tasks assessed    Cervical / Trunk Assessment Cervical / Trunk Assessment: Kyphotic;Other exceptions Cervical / Trunk Exceptions: scoliotic with R hip higher  Communication  Communication: No difficulties  Cognition Arousal/Alertness: Awake/alert Behavior During Therapy: WFL for tasks assessed/performed Overall Cognitive Status: Within Functional Limits for tasks assessed                      General Comments General comments (skin integrity, edema, etc.): Reviewed  safety/fall prevention information with pt and daughter (who is caregiver)    Exercises     Assessment/Plan    PT Assessment Patient needs continued PT services  PT Problem List Decreased strength;Decreased balance;Decreased mobility;Decreased knowledge of use of DME       PT Treatment Interventions DME instruction;Gait training;Therapeutic exercise;Patient/family education;Balance training;Functional mobility training;Therapeutic activities    PT Goals (Current goals can be found in the Care Plan section)  Acute Rehab PT Goals Patient Stated Goal: to go home PT Goal Formulation: With patient/family Time For Goal Achievement: 12/02/16 Potential to Achieve Goals: Good    Frequency Min 3X/week   Barriers to discharge        Co-evaluation               End of Session Equipment Utilized During Treatment: Gait belt Activity Tolerance: Patient tolerated treatment well Patient left: in chair;with call bell/phone within reach;with chair alarm set;with family/visitor present   PT Visit Diagnosis: Other abnormalities of gait and mobility (R26.89)    Functional Assessment Tool Used: AM-PAC 6 Clicks Basic Mobility Functional Limitation: Mobility: Walking and moving around Mobility: Walking and Moving Around Current Status [id number]): At least 40 percent but less than 60 percent impaired, limited or restricted Mobility: Walking and Moving Around Goal Status [phone]): At least 20 percent but less than 40 percent impaired, limited or restricted    Time: [phone] PT Time Calculation (min) (ACUTE ONLY): 24 min   Charges:   PT Evaluation $PT Eval Moderate Complexity: 1 Procedure PT Treatments $Gait Training: 8-22 mins   PT G Codes:   PT G-Codes **NOT FOR INPATIENT CLASS** Functional Assessment Tool Used: AM-PAC 6 Clicks Basic Mobility Functional Limitation: Mobility: Walking and moving around Mobility: Walking and Moving Around Current Status [id number]): At least 40 percent but  less than 60 percent impaired, limited or restricted Mobility: Walking and Moving Around Goal Status [phone]): At least 20 percent but less than 40 percent impaired, limited or restricted     Reginia Naas 11/25/2016, 11:56 AM  Magda Kiel, [place] 11/25/2016

## 2016-11-25 NOTE — Procedures (Signed)
HPI:  83 y/o with MS change  TECHNICAL SUMMARY:  A multichannel referential and bipolar montage EEG using the standard international 10-20 system was performed on the patient described as awake, drowsy and asleep.  The dominant background activity consists of 8.5-9.5 hertz activity seen most prominantly over the posterior head region.  The backgound activity is reactive to eye opening and closing procedures.  Low voltage fast (beta) activity is distributed symmetrically and maximally over the anterior head regions.  ACTIVATION:  Stepwise photic stimulation and hyperventilation were not performed  EPILEPTIFORM ACTIVITY:  There were no spikes, sharp waves or paroxysmal activity.  SLEEP:  Both stage I and stage II sleep architecture were identified  CARDIAC:  The EKG lead was not recorded.  IMPRESSION:  This is a normal EEG for the patients stated age.  There were no focal, hemispheric or lateralizing features.  No epileptiform activity was recorded.  A normal EEG does not exclude the diagnosis of a seizure disorder and if seizure remains high on the list of differential diagnosis, an ambulatory EEG may be of value.  Clinical correlation is required.

## 2016-11-25 NOTE — Care Management Obs Status (Signed)
[facility] NOTIFICATION   Patient Details  Name: Raven Gray MRN: [id number] Date of Birth: [birth year]   Medicare Observation Status Notification Given:  Yes    Pollie Friar, RN 11/25/2016, 4:56 PM

## 2016-11-25 NOTE — Evaluation (Signed)
Occupational Therapy Evaluation Patient Details Name: Raven Gray MRN: [id number] DOB: 03-04-1928 Today's Date: 11/25/2016    History of Present Illness Pt is an 83 y.o. female who presented to the ED with sudden confusion and staring to the L with decreased responsiveness as well as weakness on ambulation. She has a PMH significant for afib on xarelto, hx of DVT/PE, HTN, TIA followed by Dr. [name].   Clinical Impression   PTA, pt was independent with assistive devices for ADL and functional mobility. She currently requires min guard assist for toilet transfers using RW and supervision with standing ADL tasks. She does report some decreased central vision and demonstrates generalized weakness and decreased activity tolerance impacting ability to complete ADL at PLOF. Pt would benefit from home health OT services post-acute D/C to maximize return to PLOF and functional mobility. OT will continue to follow acutely while admitted in order to improve independence with ADL and functional mobility.    Follow Up Recommendations  Home health OT;Supervision/Assistance - 24 hour    Equipment Recommendations  None recommended by OT    Recommendations for Other Services       Precautions / Restrictions Precautions Precautions: Fall Restrictions Weight Bearing Restrictions: No      Mobility Bed Mobility Overal bed mobility: Needs Assistance Bed Mobility: Supine to Sit     Supine to sit: Supervision     General bed mobility comments: Supervision for safety.  Transfers Overall transfer level: Needs assistance Equipment used: Rolling walker (2 wheeled) Transfers: Sit to/from Stand Sit to Stand: Min guard         General transfer comment: Min guard assist for safety.    Balance Overall balance assessment: Needs assistance Sitting-balance support: No upper extremity supported;Feet supported Sitting balance-Leahy Scale: Good     Standing balance support: Bilateral upper extremity  supported;No upper extremity supported;During functional activity Standing balance-Leahy Scale: Fair Standing balance comment: Able to stand statically for grooming tasks without UE support. Relies on B UE support for dynamic tasks.                            ADL Overall ADL's : Needs assistance/impaired Eating/Feeding: Set up;Sitting   Grooming: Supervision/safety;Standing   Upper Body Bathing: Supervision/ safety;Sitting   Lower Body Bathing: Supervison/ safety;Sit to/from stand Lower Body Bathing Details (indicate cue type and reason): Min guard sit<>stand Upper Body Dressing : Supervision/safety;Sitting   Lower Body Dressing: Supervision/safety;Sit to/from stand Lower Body Dressing Details (indicate cue type and reason): Min guard sit<>stand Toilet Transfer: Ambulation;BSC;RW;Min guard   Toileting- Clothing Manipulation and Hygiene: Sit to/from stand;Min guard       Functional mobility during ADLs: Min guard;Rolling walker General ADL Comments: Pt and daughter report at or close to baseline for functional mobility and ADL. Daughter is available 24/7 but lives "up the street." Pt no longer gets into shower but takes "bed baths"     Vision Baseline Vision/History: Wears glasses Wears Glasses: At all times Patient Visual Report: No change from baseline Additional Comments: Pt reports some blurriness (described as a "line") in her central vision at times. Reports that her MD has said she is at risk for glaucoma. Able to use vision functionally on evaluation.      Perception     Praxis      Pertinent Vitals/Pain Pain Assessment: No/denies pain     Hand Dominance Right   Extremity/Trunk Assessment Upper Extremity Assessment Upper Extremity Assessment: Generalized weakness;RUE  deficits/detail RUE Deficits / Details: Decreased active shoulder ROM due to previous rotator cuff injury.   Lower Extremity Assessment Lower Extremity Assessment: Defer to PT  evaluation       Communication Communication Communication: No difficulties   Cognition Arousal/Alertness: Awake/alert Behavior During Therapy: WFL for tasks assessed/performed Overall Cognitive Status: Within Functional Limits for tasks assessed                     General Comments       Exercises       Shoulder Instructions      Home Living Family/patient expects to be discharged to:: Private residence Living Arrangements: Alone Available Help at Discharge: Family;Available 24 hours/day Type of Home: House Home Access: Ramped entrance     Home Layout: One level     Bathroom Shower/Tub:  (Does bed baths)   Bathroom Toilet: Standard     Home Equipment: Walker - 2 wheels;Wheelchair - manual;Cane - single point;Bedside commode          Prior Functioning/Environment Level of Independence: Independent with assistive device(s)        Comments: Uses RW or w/c for mobility. She also reports using cane at times, but per daughter, her family has removed most of the canes in order to improve safety.        OT Problem List: Decreased strength;Decreased activity tolerance;Impaired balance (sitting and/or standing);Decreased knowledge of use of DME or AE;Decreased safety awareness;Impaired vision/perception      OT Treatment/Interventions: Self-care/ADL training;Therapeutic exercise;Energy conservation;DME and/or AE instruction;Therapeutic activities;Visual/perceptual remediation/compensation;Patient/family education;Balance training    OT Goals(Current goals can be found in the care plan section) Acute Rehab OT Goals Patient Stated Goal: to go home OT Goal Formulation: With patient/family Time For Goal Achievement: 12/09/16 Potential to Achieve Goals: Good ADL Goals Pt Will Perform Grooming: with modified independence;standing Pt Will Transfer to Toilet: with modified independence;ambulating;bedside commode Pt Will Perform Toileting - Clothing Manipulation  and hygiene: with modified independence;sit to/from stand  OT Frequency: Min 2X/week   Barriers to D/C:            Co-evaluation              End of Session Equipment Utilized During Treatment: Gait belt;Rolling walker Nurse Communication: Mobility status  Activity Tolerance: Patient tolerated treatment well Patient left: in bed;with call bell/phone within reach;with nursing/sitter in room;with family/visitor present (Getting chest x-ray)  OT Visit Diagnosis: Unsteadiness on feet (R26.81);Muscle weakness (generalized) (M62.81)                ADL either performed or assessed with clinical judgement  Time: 0922-0949 OT Time Calculation (min): 27 min Charges:  OT General Charges $OT Visit: 1 Procedure OT Evaluation $OT Eval Moderate Complexity: 1 Procedure OT Treatments $Self Care/Home Management : 8-22 mins G-Codes: OT G-codes **NOT FOR INPATIENT CLASS** Functional Assessment Tool Used: AM-PAC 6 Clicks Daily Activity Functional Limitation: Self care Self Care Current Status [id number]): At least 20 percent but less than 40 percent impaired, limited or restricted Self Care Goal Status [id number]): At least 1 percent but less than 20 percent impaired, limited or restricted   Norman Herrlich, [place] OTR/L  Pager: [phone] 11/25/2016, 11:15 AM

## 2016-11-25 NOTE — Progress Notes (Signed)
Pt d/c to home by car with family. Assessment stable. All questions answered. 

## 2016-11-25 NOTE — Progress Notes (Signed)
EEG Completed; Results Pending  

## 2016-11-25 NOTE — Telephone Encounter (Signed)
Spoke with Dr Caryl Asp for PT/OT

## 2016-11-25 NOTE — Telephone Encounter (Signed)
[facility] notified

## 2016-11-25 NOTE — Discharge Summary (Signed)
Physician Discharge Summary  Raven Gray [id number] DOB: 04/20/1928 DOA: 11/24/2016  PCP: Raven Bolt, MD  Admit date: 11/24/2016 Discharge date: 11/25/2016  Time spent: 65 minutes  Recommendations for Outpatient Follow-up:  1. Follow-up with Raven Bolt, MD in 1-2 weeks. On follow-up patient's volume status will need to be reassessed. Patient will need a basic metabolic profile done to follow-up on electrolytes and renal function. 2. Follow-up with Dr. Leonie Gray, neurology in 3-4 weeks.   Discharge Diagnoses:  Principal Problem:   Acute encephalopathy Active Problems:   Dehydration   COPD with acute exacerbation (HCC)   Hyperlipidemia   TIA (transient ischemic attack)   Essential hypertension   Chronic anticoagulation   Encephalopathy   Discharge Condition: Stable and improved  Diet recommendation: Heart healthy  Filed Weights   11/24/16 2043  Weight: 83.6 kg (184 lb 3.2 oz)    History of present illness:  Per Dr Raven Gray is a 83 y.o. female with medical history significant of afib on xarelto, hx of DVT/PE, HTN, TIA followed by Dr. Leonie Gray who presented to the ED with sudden confusion and staring to the left with decreased level of responsiveness while at dinner table. Symptoms reportedly lasted around 62min. Patient attempted to ambulate to another room, but found that her legs "almost gave out on me" with increased generalized weakness. Following event and while in ED, patient noted to be more confused than normal.  On further questioning, patient reports pulmonogist recently increased lasix dose to 80mg  daily two weeks prior to admission for LE edema. During this time, patient reported ample urination and developing thirst. At present, patient reported feeling quite thirsty and is asking for something to drink  ED Course: In the ED, Neurology was consulted by EDP who reportedly did not recommend intracranial imaging. Cr was noted to be 1.2 (was 1.16 on  2/1), BUN 21, UA was found to be unremarkable, pt afebtrile without leukocytosis. Given concerns of encephalopathy, hospitalist consulted for consideration for admission.  Hospital Course:  #1 acute metabolic encephalopathy likely secondary to dehydration Patient was admitted with sudden onset confusion with decreased level of responsiveness while at the dinner table. Patient was noted to try to stand and reported that her legs almost gave out with increased generalized weakness. Patient was admitted for further evaluation as well as neurology consultation. CT head which was done was negative. Patient had no signs or symptoms of infection. Chest x-ray which was done was unremarkable. Patient's diuretics were held and patient was placed on IV fluids as patient appeared clinically dry on examination. Patient was hydrated with IV fluids and monitored. Neurology was consulted who assessed the patient and felt likely secondary to metabolic encephalopathy and did not feel any further stroke workup was needed as patient had recently had a stroke workup in December 2017 and was already on chronic anticoagulation of xarelto, and a such management would not differ. Neurology had recommended seizure workup. EEG which was done was unremarkable. Patient did not have any further episodes and improved clinically during the hospitalization. It was noted that patient's diuretics were recently increased from 60 mg daily to 80 mg daily which may have been etiology of patient's dehydration. Patient be discharged home in stable and improved condition and is to resume her prior regimen of Lasix 60 mg daily in 2-3 days. Patient is to follow-up with PCP and neurology in the outpatient setting.  #2 dehydration Patient was noted to be clinically dehydrated on physical exam on  presentation. Patient's diuretics had recently been increased from 60 mg daily to 80 mg daily. Patient had presented with signs of generalized weakness.  Patient's diuretics were held. Patient was gently hydrated with IV fluids. Patient will be discharged home back on her old regimen of Lasix 60 mg daily to start 2-3 days postdischarge. Outpatient follow-up.  #3 hyperlipidemia Remained stable. Patient was maintained on a home regimen.  #4 history of TIA Stable. Patient would recent stroke workup done in December 2017. Patient seen in consultation by neurology who felt no further stroke workup was needed at this time. Patient remained in stable condition was maintained on anticoagulation for secondary stroke prevention.  #5 hypertension Stable. Patient maintained on home regimen of antihypertensive medications.  #6 history of COPD on chronic home O2,  remained stable. Outpatient follow-up.  Procedures:  CT head 11/24/2016  CXR 11/25/2016  EEG 11/25/2016  Consultations:  Neurology: Dr Raven Gray 11/24/2016  Discharge Exam: Vitals:   11/25/16 0927 11/25/16 1440  BP:  109/76  Pulse: 76 69  Resp:  20  Temp:  97.5 F (36.4 C)    General: NAD Cardiovascular: RRR Respiratory: CTAB  Discharge Instructions   Discharge Instructions    Diet - low sodium heart healthy    Complete by:  As directed    Discharge instructions    Complete by:  As directed    Resume lasix at prior doe of 60 mg daily in 2 days.   Increase activity slowly    Complete by:  As directed      Current Discharge Medication List    CONTINUE these medications which have CHANGED   Details  furosemide (LASIX) 40 MG tablet Take 1.5 tablets (60 mg total) by mouth every morning. Qty: 30 tablet      CONTINUE these medications which have NOT CHANGED   Details  acetaminophen (TYLENOL) 325 MG tablet Take 650 mg by mouth every 6 (six) hours as needed (for pain).     ALPRAZolam (XANAX) 0.25 MG tablet Take 1 tablet (0.25 mg total) by mouth 2 (two) times daily as needed for anxiety. Qty: 30 tablet, Refills: 0    isosorbide mononitrate (IMDUR) 60 MG 24 hr tablet Take 1  tablet (60 mg total) by mouth daily. Qty: 90 tablet, Refills: 3    metoprolol (LOPRESSOR) 25 MG tablet Take 1 tablet (25 mg total) by mouth 2 (two) times daily. Qty: 180 tablet, Refills: 3    Multiple Vitamins-Minerals (WOMENS 50+ MULTI VITAMIN/MIN) TABS Take 1 tablet by mouth daily.    nitroGLYCERIN (NITROSTAT) 0.4 MG SL tablet Place 0.4 mg under the tongue as needed for chest pain. X 3 doses Refills: 0    omeprazole (PRILOSEC) 20 MG capsule Take 20 mg by mouth daily as needed (for reflux/heartburn).  Refills: 11    oxybutynin (DITROPAN-XL) 5 MG 24 hr tablet Take 5 mg by mouth daily. Refills: 0    OXYGEN Inhale 2 L into the lungs continuous.    potassium chloride SA (K-DUR,KLOR-CON) 20 MEQ tablet Take 60 mEq by mouth every morning.     pravastatin (PRAVACHOL) 40 MG tablet Take 40 mg by mouth every morning.     Rivaroxaban (XARELTO) 15 MG TABS tablet Take 1 tablet (15 mg total) by mouth every morning. Qty: 30 tablet, Refills: 0    venlafaxine XR (EFFEXOR-XR) 150 MG 24 hr capsule Take 150 mg by mouth daily.    zolpidem (AMBIEN) 5 MG tablet Take 5 mg by mouth at bedtime.  No Known Allergies Follow-up Information    Raven Bolt, MD. Schedule an appointment as soon as possible for a visit in 1 week(s).   Specialty:  Endocrinology Why:  f/u in 1-2 weeks. Contact information: [address] [place] [address] [phone]        SETHI,PRAMOD, MD. Schedule an appointment as soon as possible for a visit in 3 week(s).   Specialties:  Neurology, Radiology Why:  f/u in 3-4 weeks. Contact information: [address] [place] [address] [phone]            The results of significant diagnostics from this hospitalization (including imaging, microbiology, ancillary and laboratory) are listed below for reference.    Significant Diagnostic Studies: Dg Chest 2 View  Result Date: 10/29/2016 CLINICAL DATA:  Chronic dyspnea.  EXAM: CHEST  2 VIEW COMPARISON:  Radiographs of October 20, 2014. FINDINGS: Stable cardiomediastinal silhouette. Atherosclerosis of thoracic aorta is noted. No pneumothorax or pleural effusion is noted. Stable large hiatal hernia is noted. No acute pulmonary disease is noted. Postsurgical changes are seen involving the right shoulder. IMPRESSION: Aortic atherosclerosis. Stable large hiatal hernia. No acute cardiopulmonary abnormality seen. Electronically Signed   By: Marijo Conception, M.D.   On: 10/29/2016 15:58   Ct Head Wo Contrast  Result Date: 11/24/2016 CLINICAL DATA:  Encephalopathy.  Right-sided facial droop EXAM: CT HEAD WITHOUT CONTRAST TECHNIQUE: Contiguous axial images were obtained from the base of the skull through the vertex without intravenous contrast. COMPARISON:  MR brain 09/06/2016 FINDINGS: Brain: No evidence of acute infarction, hemorrhage, extra-axial collection, ventriculomegaly, or mass effect. Generalized cerebral atrophy. Periventricular white matter low attenuation likely secondary to microangiopathy. Vascular: Cerebrovascular atherosclerotic calcifications are noted. Skull: Negative for fracture or focal lesion. Sinuses/Orbits: Visualized portions of the orbits are unremarkable. Visualized portions of the paranasal sinuses and mastoid air cells are unremarkable. Other: None. IMPRESSION: No acute intracranial pathology. Electronically Signed   By: Kathreen Devoid   On: 11/24/2016 19:50   Dg Chest Port 1 View  Result Date: 11/25/2016 CLINICAL DATA:  Encephalopathy, shortness of Breath EXAM: PORTABLE CHEST 1 VIEW COMPARISON:  10/29/2016 FINDINGS: Large hiatal hernia. Cardiomegaly. Lungs are clear. No effusions or acute bony abnormality. IMPRESSION: Large hiatal hernia, mild cardiomegaly.  No active disease. Electronically Signed   By: Rolm Baptise M.D.   On: 11/25/2016 09:56    Microbiology: No results found for this or any previous visit (from the past 240 hour(s)).   Labs: Basic  Metabolic Panel:  Recent Labs Lab 11/24/16 1638 11/25/16 0507  NA 140 140  K 3.7 3.4*  CL 98* 103  CO2  --  31  GLUCOSE 110* 120*  BUN 21* 13  CREATININE 1.20* 0.95  CALCIUM  --  8.6*   Liver Function Tests:  Recent Labs Lab 11/24/16 1740 11/25/16 0507  AST 19 20  ALT 10* 9*  ALKPHOS 73 69  BILITOT 0.6 0.6  PROT 6.1* 5.9*  ALBUMIN 3.4* 3.1*   No results for input(s): LIPASE, AMYLASE in the last 168 hours.  Recent Labs Lab 11/24/16 1740  AMMONIA 18   CBC:  Recent Labs Lab 11/24/16 1631 11/24/16 1638 11/25/16 0507  WBC 6.9  --  5.6  NEUTROABS 4.2  --   --   HGB 11.8* 13.6 10.9*  HCT 40.1 40.0 37.6  MCV 78.5  --  78.3  PLT 197  --  186   Cardiac Enzymes: No results for input(s): CKTOTAL, CKMB, CKMBINDEX, TROPONINI in  the last 168 hours. BNP: BNP (last 3 results)  Recent Labs  03/31/16 1130 10/23/16 1222  BNP 255.8* 306.7*    ProBNP (last 3 results) No results for input(s): PROBNP in the last 8760 hours.  CBG: No results for input(s): GLUCAP in the last 168 hours.     SignedIrine Seal MD.  [facility] 11/25/2016, 4:35 PM

## 2016-11-25 NOTE — Telephone Encounter (Signed)
She wants to know if Dr Gwenlyn Found would just sign her home health orders? Her primary doctor will not sign for any of his patients.

## 2016-11-27 DIAGNOSIS — Z8673 Personal history of transient ischemic attack (TIA), and cerebral infarction without residual deficits: Secondary | ICD-10-CM | POA: Diagnosis not present

## 2016-11-27 DIAGNOSIS — I10 Essential (primary) hypertension: Secondary | ICD-10-CM | POA: Diagnosis not present

## 2016-11-27 DIAGNOSIS — G92 Toxic encephalopathy: Secondary | ICD-10-CM | POA: Diagnosis not present

## 2016-11-27 DIAGNOSIS — Z9981 Dependence on supplemental oxygen: Secondary | ICD-10-CM | POA: Diagnosis not present

## 2016-11-27 DIAGNOSIS — I4891 Unspecified atrial fibrillation: Secondary | ICD-10-CM | POA: Diagnosis not present

## 2016-11-27 DIAGNOSIS — Z86718 Personal history of other venous thrombosis and embolism: Secondary | ICD-10-CM | POA: Diagnosis not present

## 2016-11-27 DIAGNOSIS — E860 Dehydration: Secondary | ICD-10-CM | POA: Diagnosis not present

## 2016-11-27 DIAGNOSIS — E785 Hyperlipidemia, unspecified: Secondary | ICD-10-CM | POA: Diagnosis not present

## 2016-11-27 DIAGNOSIS — Z96659 Presence of unspecified artificial knee joint: Secondary | ICD-10-CM | POA: Diagnosis not present

## 2016-11-27 DIAGNOSIS — J441 Chronic obstructive pulmonary disease with (acute) exacerbation: Secondary | ICD-10-CM | POA: Diagnosis not present

## 2016-11-27 DIAGNOSIS — Z7901 Long term (current) use of anticoagulants: Secondary | ICD-10-CM | POA: Diagnosis not present

## 2016-11-27 DIAGNOSIS — J449 Chronic obstructive pulmonary disease, unspecified: Secondary | ICD-10-CM | POA: Diagnosis not present

## 2016-12-01 DIAGNOSIS — Z96659 Presence of unspecified artificial knee joint: Secondary | ICD-10-CM | POA: Diagnosis not present

## 2016-12-01 DIAGNOSIS — Z9981 Dependence on supplemental oxygen: Secondary | ICD-10-CM | POA: Diagnosis not present

## 2016-12-01 DIAGNOSIS — G92 Toxic encephalopathy: Secondary | ICD-10-CM | POA: Diagnosis not present

## 2016-12-01 DIAGNOSIS — Z7901 Long term (current) use of anticoagulants: Secondary | ICD-10-CM | POA: Diagnosis not present

## 2016-12-01 DIAGNOSIS — Z86718 Personal history of other venous thrombosis and embolism: Secondary | ICD-10-CM | POA: Diagnosis not present

## 2016-12-01 DIAGNOSIS — E860 Dehydration: Secondary | ICD-10-CM | POA: Diagnosis not present

## 2016-12-01 DIAGNOSIS — E785 Hyperlipidemia, unspecified: Secondary | ICD-10-CM | POA: Diagnosis not present

## 2016-12-01 DIAGNOSIS — I10 Essential (primary) hypertension: Secondary | ICD-10-CM | POA: Diagnosis not present

## 2016-12-01 DIAGNOSIS — I4891 Unspecified atrial fibrillation: Secondary | ICD-10-CM | POA: Diagnosis not present

## 2016-12-01 DIAGNOSIS — J441 Chronic obstructive pulmonary disease with (acute) exacerbation: Secondary | ICD-10-CM | POA: Diagnosis not present

## 2016-12-01 DIAGNOSIS — Z8673 Personal history of transient ischemic attack (TIA), and cerebral infarction without residual deficits: Secondary | ICD-10-CM | POA: Diagnosis not present

## 2016-12-02 ENCOUNTER — Telehealth: Payer: Self-pay | Admitting: Neurology

## 2016-12-02 NOTE — Telephone Encounter (Signed)
Esther from [facility] called to advise that per the patient daughter they do not want to pursue the Pt. The daughter said that her mother can do for her self and is not interested in showering, she will take baths.

## 2016-12-02 NOTE — Telephone Encounter (Signed)
Left vm for Sherlynn Stalls that [place] that a message will be sent to Dr. [name] about pt declining PT services.

## 2016-12-02 NOTE — Telephone Encounter (Signed)
FYI Dr. [name].

## 2016-12-04 ENCOUNTER — Telehealth: Payer: Self-pay | Admitting: Cardiovascular Disease

## 2016-12-04 MED ORDER — RIVAROXABAN 15 MG PO TABS: 15.0000 mg | ORAL_TABLET | Freq: Every morning | ORAL | 0 refills | Status: DC

## 2016-12-04 NOTE — Telephone Encounter (Signed)
Call placed to the patient's daughter that samples were placed at the front for her.

## 2016-12-04 NOTE — Telephone Encounter (Signed)
Patient daughter [name]) is calling to see if we have samples of Xarelto 15mg . Please call to discuss, thanks.

## 2016-12-05 DIAGNOSIS — I10 Essential (primary) hypertension: Secondary | ICD-10-CM | POA: Diagnosis not present

## 2016-12-05 DIAGNOSIS — Z8673 Personal history of transient ischemic attack (TIA), and cerebral infarction without residual deficits: Secondary | ICD-10-CM | POA: Diagnosis not present

## 2016-12-05 DIAGNOSIS — E860 Dehydration: Secondary | ICD-10-CM | POA: Diagnosis not present

## 2016-12-05 DIAGNOSIS — Z9981 Dependence on supplemental oxygen: Secondary | ICD-10-CM | POA: Diagnosis not present

## 2016-12-05 DIAGNOSIS — J441 Chronic obstructive pulmonary disease with (acute) exacerbation: Secondary | ICD-10-CM | POA: Diagnosis not present

## 2016-12-05 DIAGNOSIS — G92 Toxic encephalopathy: Secondary | ICD-10-CM | POA: Diagnosis not present

## 2016-12-05 DIAGNOSIS — Z96659 Presence of unspecified artificial knee joint: Secondary | ICD-10-CM | POA: Diagnosis not present

## 2016-12-05 DIAGNOSIS — I4891 Unspecified atrial fibrillation: Secondary | ICD-10-CM | POA: Diagnosis not present

## 2016-12-05 DIAGNOSIS — E785 Hyperlipidemia, unspecified: Secondary | ICD-10-CM | POA: Diagnosis not present

## 2016-12-05 DIAGNOSIS — Z7901 Long term (current) use of anticoagulants: Secondary | ICD-10-CM | POA: Diagnosis not present

## 2016-12-05 DIAGNOSIS — Z86718 Personal history of other venous thrombosis and embolism: Secondary | ICD-10-CM | POA: Diagnosis not present

## 2016-12-09 DIAGNOSIS — Z86718 Personal history of other venous thrombosis and embolism: Secondary | ICD-10-CM | POA: Diagnosis not present

## 2016-12-09 DIAGNOSIS — Z96659 Presence of unspecified artificial knee joint: Secondary | ICD-10-CM | POA: Diagnosis not present

## 2016-12-09 DIAGNOSIS — Z9981 Dependence on supplemental oxygen: Secondary | ICD-10-CM | POA: Diagnosis not present

## 2016-12-09 DIAGNOSIS — E785 Hyperlipidemia, unspecified: Secondary | ICD-10-CM | POA: Diagnosis not present

## 2016-12-09 DIAGNOSIS — I10 Essential (primary) hypertension: Secondary | ICD-10-CM | POA: Diagnosis not present

## 2016-12-09 DIAGNOSIS — E789 Disorder of lipoprotein metabolism, unspecified: Secondary | ICD-10-CM | POA: Diagnosis not present

## 2016-12-09 DIAGNOSIS — D229 Melanocytic nevi, unspecified: Secondary | ICD-10-CM | POA: Diagnosis not present

## 2016-12-09 DIAGNOSIS — I4891 Unspecified atrial fibrillation: Secondary | ICD-10-CM | POA: Diagnosis not present

## 2016-12-09 DIAGNOSIS — E860 Dehydration: Secondary | ICD-10-CM | POA: Diagnosis not present

## 2016-12-09 DIAGNOSIS — G92 Toxic encephalopathy: Secondary | ICD-10-CM | POA: Diagnosis not present

## 2016-12-09 DIAGNOSIS — Z7901 Long term (current) use of anticoagulants: Secondary | ICD-10-CM | POA: Diagnosis not present

## 2016-12-09 DIAGNOSIS — R0602 Shortness of breath: Secondary | ICD-10-CM | POA: Diagnosis not present

## 2016-12-09 DIAGNOSIS — J441 Chronic obstructive pulmonary disease with (acute) exacerbation: Secondary | ICD-10-CM | POA: Diagnosis not present

## 2016-12-09 DIAGNOSIS — Z8673 Personal history of transient ischemic attack (TIA), and cerebral infarction without residual deficits: Secondary | ICD-10-CM | POA: Diagnosis not present

## 2016-12-11 ENCOUNTER — Telehealth: Payer: Self-pay | Admitting: Cardiovascular Disease

## 2016-12-11 ENCOUNTER — Telehealth: Payer: Self-pay | Admitting: Internal Medicine

## 2016-12-11 DIAGNOSIS — Z8673 Personal history of transient ischemic attack (TIA), and cerebral infarction without residual deficits: Secondary | ICD-10-CM | POA: Diagnosis not present

## 2016-12-11 DIAGNOSIS — Z86718 Personal history of other venous thrombosis and embolism: Secondary | ICD-10-CM | POA: Diagnosis not present

## 2016-12-11 DIAGNOSIS — Z7901 Long term (current) use of anticoagulants: Secondary | ICD-10-CM | POA: Diagnosis not present

## 2016-12-11 DIAGNOSIS — I4891 Unspecified atrial fibrillation: Secondary | ICD-10-CM | POA: Diagnosis not present

## 2016-12-11 DIAGNOSIS — I10 Essential (primary) hypertension: Secondary | ICD-10-CM | POA: Diagnosis not present

## 2016-12-11 DIAGNOSIS — Z96659 Presence of unspecified artificial knee joint: Secondary | ICD-10-CM | POA: Diagnosis not present

## 2016-12-11 DIAGNOSIS — G92 Toxic encephalopathy: Secondary | ICD-10-CM | POA: Diagnosis not present

## 2016-12-11 DIAGNOSIS — E785 Hyperlipidemia, unspecified: Secondary | ICD-10-CM | POA: Diagnosis not present

## 2016-12-11 DIAGNOSIS — J441 Chronic obstructive pulmonary disease with (acute) exacerbation: Secondary | ICD-10-CM | POA: Diagnosis not present

## 2016-12-11 DIAGNOSIS — E860 Dehydration: Secondary | ICD-10-CM | POA: Diagnosis not present

## 2016-12-11 DIAGNOSIS — Z9981 Dependence on supplemental oxygen: Secondary | ICD-10-CM | POA: Diagnosis not present

## 2016-12-11 NOTE — Telephone Encounter (Signed)
lmtcb

## 2016-12-11 NOTE — Telephone Encounter (Signed)
New message      Calling to get verbal order for PT in the home for 3 more visits. Also, calling to see if you are thinking of weaning pt off oxygen?  Her O2 sat is in the high 90's. Please call

## 2016-12-11 NOTE — Telephone Encounter (Signed)
Will forward to [facility] per TD

## 2016-12-12 NOTE — Telephone Encounter (Signed)
Received call from [facility] PT-requesting to extend orders for PT.  Also requesting to see if she can wean patient off of oxygen.  Reports she has been doing very well-reports doing PT off of O2 and oxygen has remained high.    O2 on sitting-98-99% RA sitting-98% Ambulating without O2-97-98%, patient becomes SOB ambulating but O2 remains high.    Erline Levine states PCP wanted Dr. Gwenlyn Found to make this decision.    Routed to Dr. Gwenlyn Found for recommendations.

## 2016-12-12 NOTE — Telephone Encounter (Signed)
Okay to wean off O2 and continue physical therapy.

## 2016-12-12 NOTE — Telephone Encounter (Signed)
Returned call to [facility] aware of recommendations.

## 2016-12-16 DIAGNOSIS — J441 Chronic obstructive pulmonary disease with (acute) exacerbation: Secondary | ICD-10-CM | POA: Diagnosis not present

## 2016-12-16 DIAGNOSIS — Z8673 Personal history of transient ischemic attack (TIA), and cerebral infarction without residual deficits: Secondary | ICD-10-CM | POA: Diagnosis not present

## 2016-12-16 DIAGNOSIS — E860 Dehydration: Secondary | ICD-10-CM | POA: Diagnosis not present

## 2016-12-16 DIAGNOSIS — Z9981 Dependence on supplemental oxygen: Secondary | ICD-10-CM | POA: Diagnosis not present

## 2016-12-16 DIAGNOSIS — I4891 Unspecified atrial fibrillation: Secondary | ICD-10-CM | POA: Diagnosis not present

## 2016-12-16 DIAGNOSIS — Z96659 Presence of unspecified artificial knee joint: Secondary | ICD-10-CM | POA: Diagnosis not present

## 2016-12-16 DIAGNOSIS — I10 Essential (primary) hypertension: Secondary | ICD-10-CM | POA: Diagnosis not present

## 2016-12-16 DIAGNOSIS — G92 Toxic encephalopathy: Secondary | ICD-10-CM | POA: Diagnosis not present

## 2016-12-16 DIAGNOSIS — Z86718 Personal history of other venous thrombosis and embolism: Secondary | ICD-10-CM | POA: Diagnosis not present

## 2016-12-16 DIAGNOSIS — Z7901 Long term (current) use of anticoagulants: Secondary | ICD-10-CM | POA: Diagnosis not present

## 2016-12-16 DIAGNOSIS — E785 Hyperlipidemia, unspecified: Secondary | ICD-10-CM | POA: Diagnosis not present

## 2016-12-17 NOTE — Telephone Encounter (Signed)
Faxed consult notes and radiology report to [facility]

## 2016-12-18 DIAGNOSIS — J441 Chronic obstructive pulmonary disease with (acute) exacerbation: Secondary | ICD-10-CM | POA: Diagnosis not present

## 2016-12-18 DIAGNOSIS — Z86718 Personal history of other venous thrombosis and embolism: Secondary | ICD-10-CM | POA: Diagnosis not present

## 2016-12-18 DIAGNOSIS — Z8673 Personal history of transient ischemic attack (TIA), and cerebral infarction without residual deficits: Secondary | ICD-10-CM | POA: Diagnosis not present

## 2016-12-18 DIAGNOSIS — Z9981 Dependence on supplemental oxygen: Secondary | ICD-10-CM | POA: Diagnosis not present

## 2016-12-18 DIAGNOSIS — Z96659 Presence of unspecified artificial knee joint: Secondary | ICD-10-CM | POA: Diagnosis not present

## 2016-12-18 DIAGNOSIS — G92 Toxic encephalopathy: Secondary | ICD-10-CM | POA: Diagnosis not present

## 2016-12-18 DIAGNOSIS — Z7901 Long term (current) use of anticoagulants: Secondary | ICD-10-CM | POA: Diagnosis not present

## 2016-12-18 DIAGNOSIS — I10 Essential (primary) hypertension: Secondary | ICD-10-CM | POA: Diagnosis not present

## 2016-12-18 DIAGNOSIS — E785 Hyperlipidemia, unspecified: Secondary | ICD-10-CM | POA: Diagnosis not present

## 2016-12-18 DIAGNOSIS — E860 Dehydration: Secondary | ICD-10-CM | POA: Diagnosis not present

## 2016-12-18 DIAGNOSIS — I4891 Unspecified atrial fibrillation: Secondary | ICD-10-CM | POA: Diagnosis not present

## 2016-12-23 ENCOUNTER — Telehealth: Payer: Self-pay | Admitting: Cardiovascular Disease

## 2016-12-23 DIAGNOSIS — J449 Chronic obstructive pulmonary disease, unspecified: Secondary | ICD-10-CM | POA: Diagnosis not present

## 2016-12-23 NOTE — Telephone Encounter (Signed)
New message    Pt daughter is calling.   Patient calling the office for samples of medication:   1.  What medication and dosage are you requesting samples for? Xarelto 15 mg  2.  Are you currently out of this medication? Pt has enough through Saturday.

## 2016-12-23 NOTE — Telephone Encounter (Signed)
Received call back-aware samples are available and at front desk for pick up.

## 2016-12-23 NOTE — Telephone Encounter (Signed)
Medication samples have been provided to the patient.  Drug name: Xarelto 15mg   Qty: 21  LOT: [id number]  Exp.Date: 01/20  Samples left at front desk for patient pick-up. Attempt to call back to make aware-no answer, lmtcb.

## 2016-12-26 ENCOUNTER — Encounter: Payer: Self-pay | Admitting: Cardiology

## 2016-12-26 ENCOUNTER — Telehealth: Payer: Self-pay | Admitting: Cardiovascular Disease

## 2016-12-26 DIAGNOSIS — Z86718 Personal history of other venous thrombosis and embolism: Secondary | ICD-10-CM | POA: Diagnosis not present

## 2016-12-26 DIAGNOSIS — E785 Hyperlipidemia, unspecified: Secondary | ICD-10-CM | POA: Diagnosis not present

## 2016-12-26 DIAGNOSIS — Z96659 Presence of unspecified artificial knee joint: Secondary | ICD-10-CM | POA: Diagnosis not present

## 2016-12-26 DIAGNOSIS — I10 Essential (primary) hypertension: Secondary | ICD-10-CM | POA: Diagnosis not present

## 2016-12-26 DIAGNOSIS — G92 Toxic encephalopathy: Secondary | ICD-10-CM | POA: Diagnosis not present

## 2016-12-26 DIAGNOSIS — J441 Chronic obstructive pulmonary disease with (acute) exacerbation: Secondary | ICD-10-CM | POA: Diagnosis not present

## 2016-12-26 DIAGNOSIS — Z7901 Long term (current) use of anticoagulants: Secondary | ICD-10-CM | POA: Diagnosis not present

## 2016-12-26 DIAGNOSIS — I4891 Unspecified atrial fibrillation: Secondary | ICD-10-CM | POA: Diagnosis not present

## 2016-12-26 DIAGNOSIS — Z8673 Personal history of transient ischemic attack (TIA), and cerebral infarction without residual deficits: Secondary | ICD-10-CM | POA: Diagnosis not present

## 2016-12-26 DIAGNOSIS — E860 Dehydration: Secondary | ICD-10-CM | POA: Diagnosis not present

## 2016-12-26 DIAGNOSIS — Z9981 Dependence on supplemental oxygen: Secondary | ICD-10-CM | POA: Diagnosis not present

## 2016-12-26 NOTE — Telephone Encounter (Signed)
New message       Calling to report O2 sat.  Off oxygen today for about 2-3 hrs and during exercise she stayed 98-99% on room air.  But, when pt stopped and was resting, her O2 dropped to 90-95%.  Pt is being discharged from PT because she is moving really well.  Would you consider having pt use oxygen only at night instead of all day?

## 2016-12-26 NOTE — Telephone Encounter (Signed)
Message routed to Dr Gwenlyn Found for advice Patient has OV with L. Dorene Ar, NP on 4/24

## 2016-12-28 DIAGNOSIS — J449 Chronic obstructive pulmonary disease, unspecified: Secondary | ICD-10-CM | POA: Diagnosis not present

## 2016-12-31 NOTE — Telephone Encounter (Signed)
Called Stacy with [facility]. She discharged patient last week. She requested that I reach out to patient to notify her of MD advice concerning patient's home oxygen.   Spoke with [name] (daughter) who thinks that patient needs oxygen more than QHS. Advised OK to use oxygen during day, QHS, prn until 4/24 appt, at which time it can be addressed further.   Advised that patient should contact pulmonologist for an appointment but they were displeased w/this MD

## 2016-12-31 NOTE — Telephone Encounter (Signed)
Yes, can use oxygen at night

## 2017-01-08 ENCOUNTER — Emergency Department (HOSPITAL_COMMUNITY): Payer: Medicare Other

## 2017-01-08 ENCOUNTER — Observation Stay (HOSPITAL_COMMUNITY)
Admission: EM | Admit: 2017-01-08 | Discharge: 2017-01-10 | Disposition: A | Discharge status: Home / Self Care | Payer: Medicare Other | Attending: Internal Medicine | Admitting: Internal Medicine

## 2017-01-08 ENCOUNTER — Encounter (HOSPITAL_COMMUNITY): Payer: Self-pay | Admitting: Nurse Practitioner

## 2017-01-08 DIAGNOSIS — G459 Transient cerebral ischemic attack, unspecified: Secondary | ICD-10-CM | POA: Diagnosis present

## 2017-01-08 DIAGNOSIS — F32A Depression, unspecified: Secondary | ICD-10-CM | POA: Diagnosis present

## 2017-01-08 DIAGNOSIS — J441 Chronic obstructive pulmonary disease with (acute) exacerbation: Secondary | ICD-10-CM | POA: Diagnosis not present

## 2017-01-08 DIAGNOSIS — E876 Hypokalemia: Secondary | ICD-10-CM | POA: Diagnosis not present

## 2017-01-08 DIAGNOSIS — I10 Essential (primary) hypertension: Secondary | ICD-10-CM | POA: Diagnosis not present

## 2017-01-08 DIAGNOSIS — Z9981 Dependence on supplemental oxygen: Secondary | ICD-10-CM | POA: Diagnosis not present

## 2017-01-08 DIAGNOSIS — R402441 Other coma, without documented Glasgow coma scale score, or with partial score reported, in the field [EMT or ambulance]: Secondary | ICD-10-CM | POA: Diagnosis not present

## 2017-01-08 DIAGNOSIS — Z7901 Long term (current) use of anticoagulants: Secondary | ICD-10-CM

## 2017-01-08 DIAGNOSIS — E785 Hyperlipidemia, unspecified: Secondary | ICD-10-CM | POA: Diagnosis not present

## 2017-01-08 DIAGNOSIS — Z86711 Personal history of pulmonary embolism: Secondary | ICD-10-CM | POA: Diagnosis not present

## 2017-01-08 DIAGNOSIS — I129 Hypertensive chronic kidney disease with stage 1 through stage 4 chronic kidney disease, or unspecified chronic kidney disease: Secondary | ICD-10-CM | POA: Insufficient documentation

## 2017-01-08 DIAGNOSIS — J9611 Chronic respiratory failure with hypoxia: Secondary | ICD-10-CM | POA: Diagnosis present

## 2017-01-08 DIAGNOSIS — I482 Chronic atrial fibrillation: Secondary | ICD-10-CM | POA: Diagnosis not present

## 2017-01-08 DIAGNOSIS — R0789 Other chest pain: Secondary | ICD-10-CM

## 2017-01-08 DIAGNOSIS — N189 Chronic kidney disease, unspecified: Secondary | ICD-10-CM | POA: Insufficient documentation

## 2017-01-08 DIAGNOSIS — K219 Gastro-esophageal reflux disease without esophagitis: Secondary | ICD-10-CM | POA: Diagnosis present

## 2017-01-08 DIAGNOSIS — Z8673 Personal history of transient ischemic attack (TIA), and cerebral infarction without residual deficits: Secondary | ICD-10-CM | POA: Insufficient documentation

## 2017-01-08 DIAGNOSIS — F329 Major depressive disorder, single episode, unspecified: Secondary | ICD-10-CM | POA: Insufficient documentation

## 2017-01-08 DIAGNOSIS — I4891 Unspecified atrial fibrillation: Secondary | ICD-10-CM | POA: Diagnosis not present

## 2017-01-08 DIAGNOSIS — R269 Unspecified abnormalities of gait and mobility: Secondary | ICD-10-CM | POA: Insufficient documentation

## 2017-01-08 DIAGNOSIS — R55 Syncope and collapse: Secondary | ICD-10-CM | POA: Diagnosis not present

## 2017-01-08 DIAGNOSIS — R32 Unspecified urinary incontinence: Secondary | ICD-10-CM | POA: Diagnosis not present

## 2017-01-08 DIAGNOSIS — Z86718 Personal history of other venous thrombosis and embolism: Secondary | ICD-10-CM | POA: Diagnosis not present

## 2017-01-08 DIAGNOSIS — R4020 Unspecified coma: Secondary | ICD-10-CM | POA: Diagnosis present

## 2017-01-08 DIAGNOSIS — Z79899 Other long term (current) drug therapy: Secondary | ICD-10-CM | POA: Insufficient documentation

## 2017-01-08 DIAGNOSIS — G458 Other transient cerebral ischemic attacks and related syndromes: Secondary | ICD-10-CM

## 2017-01-08 DIAGNOSIS — R4781 Slurred speech: Secondary | ICD-10-CM | POA: Diagnosis not present

## 2017-01-08 DIAGNOSIS — R404 Transient alteration of awareness: Secondary | ICD-10-CM | POA: Diagnosis not present

## 2017-01-08 DIAGNOSIS — J9811 Atelectasis: Secondary | ICD-10-CM | POA: Diagnosis not present

## 2017-01-08 DIAGNOSIS — R079 Chest pain, unspecified: Secondary | ICD-10-CM | POA: Diagnosis present

## 2017-01-08 DIAGNOSIS — I517 Cardiomegaly: Secondary | ICD-10-CM | POA: Diagnosis not present

## 2017-01-08 LAB — URINALYSIS, ROUTINE W REFLEX MICROSCOPIC
Bilirubin Urine: NEGATIVE
GLUCOSE, UA: NEGATIVE mg/dL
Ketones, ur: NEGATIVE mg/dL
Leukocytes, UA: NEGATIVE
Nitrite: NEGATIVE
PH: 5 (ref 5.0–8.0)
Protein, ur: NEGATIVE mg/dL
SPECIFIC GRAVITY, URINE: 1.008 (ref 1.005–1.030)

## 2017-01-08 LAB — BASIC METABOLIC PANEL
ANION GAP: 7 (ref 5–15)
BUN: 19 mg/dL (ref 6–20)
CHLORIDE: 104 mmol/L (ref 101–111)
CO2: 26 mmol/L (ref 22–32)
Calcium: 8.9 mg/dL (ref 8.9–10.3)
Creatinine, Ser: 1.18 mg/dL — ABNORMAL HIGH (ref 0.44–1.00)
GFR calc Af Amer: 47 mL/min — ABNORMAL LOW (ref >60)
GFR, EST NON AFRICAN AMERICAN: 40 mL/min — AB (ref >60)
GLUCOSE: 116 mg/dL — AB (ref 65–99)
POTASSIUM: 4 mmol/L (ref 3.5–5.1)
Sodium: 137 mmol/L (ref 135–145)

## 2017-01-08 LAB — CBC
HEMATOCRIT: 39.3 % (ref 36.0–46.0)
HEMOGLOBIN: 11.6 g/dL — AB (ref 12.0–15.0)
MCH: 22.9 pg — ABNORMAL LOW (ref 26.0–34.0)
MCHC: 29.5 g/dL — ABNORMAL LOW (ref 30.0–36.0)
MCV: 77.5 fL — AB (ref 78.0–100.0)
Platelets: 205 10*3/uL (ref 150–400)
RBC: 5.07 MIL/uL (ref 3.87–5.11)
RDW: 17.8 % — ABNORMAL HIGH (ref 11.5–15.5)
WBC: 6.8 10*3/uL (ref 4.0–10.5)

## 2017-01-08 LAB — HEPATIC FUNCTION PANEL
ALBUMIN: 3.4 g/dL — AB (ref 3.5–5.0)
ALT: 11 U/L — AB (ref 14–54)
AST: 21 U/L (ref 15–41)
Alkaline Phosphatase: 84 U/L (ref 38–126)
BILIRUBIN DIRECT: 0.1 mg/dL (ref 0.1–0.5)
BILIRUBIN TOTAL: 0.7 mg/dL (ref 0.3–1.2)
Indirect Bilirubin: 0.6 mg/dL (ref 0.3–0.9)
Total Protein: 6.4 g/dL — ABNORMAL LOW (ref 6.5–8.1)

## 2017-01-08 LAB — CBG MONITORING, ED: Glucose-Capillary: 92 mg/dL (ref 65–99)

## 2017-01-08 LAB — TROPONIN I: Troponin I: <0.03 ng/mL (ref <0.03)

## 2017-01-08 MED ORDER — TETANUS-DIPHTH-ACELL PERTUSSIS 5-2.5-18.5 LF-MCG/0.5 IM SUSP: 0.5000 mL | Freq: Once | INTRAMUSCULAR | Status: DC

## 2017-01-08 MED ORDER — ACETAMINOPHEN 325 MG PO TABS: 650.0000 mg | ORAL_TABLET | Freq: Once | ORAL | Status: DC

## 2017-01-08 NOTE — ED Notes (Signed)
Pt transported to xray 

## 2017-01-08 NOTE — H&P (Addendum)
History and Physical    Raven Gray OVF:[id number] DOB: August 11, 1928 DOA: 01/08/2017  PCP: [name], MD  Patient coming from: Home  Chief Complaint: LOC  HPI: Raven Gray is a 83 y.o. female with medical history significant of TIA, HTN, HLD, PE/DVT, Afib on Xarelto, Depression, urinary incontinence, generalized weakness and gait difficulty, COPD in a never smoker who presents for an episode of LOC.  Family in the room was present for the event and give additional details.  Today, while patient was visiting at her son's house, she was sitting at the table eating crackers when she felt like "an attack" was coming on.  She closed her eyes and could not reopen them.  She started having slowed and slurred speech, shallow breathing and loss of consciousness. She was laid on the floor and required 3 rescue breaths by her SIL and then she started breathing normally.  She started to come back around about 15 min later when EMS was getting her ready to transport to this hospital.  She has had 2 episodes like this in the past, first in December when she was diagnosed with a TIA.  MRI at that time showed extensive old ischemic changes and 50% stenosis of the left distal M1. The thought at the time was that there was embolic disease from this source per patient and family.  She had another episode similar in March when she was diagnosed with dehydration and her lasix was decreased.  She reports knowing these symptoms are coming on and a prodrome of "gnawing" in the stomach. She also has an issue which the family correlates to the above: For the last 3 years she has been having increasing issues with her gait, occasionally difficulty lifting her legs, shuffling of feet.  She also feels like she has to pee all the time.  She will have episodes of acute leg weakness and urinary urgency regularly.  Sometimes during these episodes, she will get lightheaded.  Further symptoms include occasional palpitations, occasional  tingling chest pain.  She has had an MPS in 2017 which was reported to be essentially normal.    ED Course: By EMS she was noted to have left sided non reactive pupil which resolved in about 1 min as patient woke up.  In the ED, she was noted to have Cr of 1.18, Glu of 116, Hgb mildly low at 11.6.  CXR showed vascular congestion and large hiatal hernia.  CT head showed mild cortical atrophy, mild chronic ischemic disease.  EKG showed RBBB and Afib which is unchanged for her.    Review of Systems: As per HPI otherwise 10 point review of systems negative.    Past Medical History:  Diagnosis Date  . Atrial fibrillation ([place])    a. on Xarelto  . Chest pain   . COPD (chronic obstructive pulmonary disease) ([place])   . H/O echocardiogram    a. 08/2016: EF of 60-65%, severely dilated LA, mild pulmonic regurgitations, mild TR, and PA Pressure at 38 mm Hg  . History of depression   . History of DVT (deep vein thrombosis)   . History of pulmonary embolism   . Hyperlipidemia   . Hypertension   . Hypokalemia    Now improved with treatment  . Shortness of breath   . Stroke [facility])     Past Surgical History:  Procedure Laterality Date  . REPLACEMENT TOTAL KNEE    . ROTATOR CUFF REPAIR     She has never smoked,  but wears O2 at home.   reports that she has never smoked. She has never used smokeless tobacco. She reports that she does not drink alcohol or use drugs.  No Known Allergies  Reviewed with patient.  Family History  Problem Relation Age of Onset  . Alzheimer's disease Mother   . Heart attack Father   . Diabetes type II Daughter   . Heart disease Daughter     stents    Prior to Admission medications   Medication Sig Start Date End Date Taking? Authorizing Provider  acetaminophen (TYLENOL) 325 MG tablet Take 650 mg by mouth every 6 (six) hours as needed (for pain).     Historical Provider, MD  ALPRAZolam Duanne Moron) 0.25 MG tablet Take 1 tablet (0.25 mg total) by mouth 2 (two) times  daily as needed for anxiety. 10/21/14   Maryann Mikhail, DO  furosemide (LASIX) 40 MG tablet Take 1.5 tablets (60 mg total) by mouth every morning. 11/27/16   Eugenie Filler, MD  isosorbide mononitrate (IMDUR) 60 MG 24 hr tablet Take 1 tablet (60 mg total) by mouth daily. 02/29/16   Lorretta Harp, MD  metoprolol (LOPRESSOR) 25 MG tablet Take 1 tablet (25 mg total) by mouth 2 (two) times daily. 06/20/14   Lorretta Harp, MD  Multiple Vitamins-Minerals (WOMENS 50+ [place] VITAMIN/MIN) TABS Take 1 tablet by mouth daily.    Historical Provider, MD  nitroGLYCERIN (NITROSTAT) 0.4 MG SL tablet Place 0.4 mg under the tongue as needed for chest pain. X 3 doses 02/08/16   Historical Provider, MD  omeprazole (PRILOSEC) 20 MG capsule Take 20 mg by mouth daily as needed (for reflux/heartburn).  04/23/15   Historical Provider, MD  oxybutynin (DITROPAN-XL) 5 MG 24 hr tablet Take 5 mg by mouth daily. 07/02/15   Historical Provider, MD  OXYGEN Inhale 2 L into the lungs continuous.    Historical Provider, MD  potassium chloride SA (K-DUR,KLOR-CON) 20 MEQ tablet Take 60 mEq by mouth every morning.     Historical Provider, MD  pravastatin (PRAVACHOL) 40 MG tablet Take 40 mg by mouth every morning.     Historical Provider, MD  Rivaroxaban (XARELTO) 15 MG TABS tablet Take 1 tablet (15 mg total) by mouth every morning. 12/04/16   Lorretta Harp, MD  venlafaxine XR (EFFEXOR-XR) 150 MG 24 hr capsule Take 150 mg by mouth daily.    Historical Provider, MD  zolpidem (AMBIEN) 5 MG tablet Take 5 mg by mouth at bedtime. 11/03/13   Historical Provider, MD    Physical Exam: Vitals:   01/08/17 2230 01/08/17 2314 01/09/17 0128 01/09/17 0131  BP: (!) 155/86  (!) 156/50 (!) 151/64  Pulse: 88  69 78  Resp: (!) 23  18 18   Temp:  98.5 F (36.9 C) 97.4 F (36.3 C) 97.4 F (36.3 C)  TempSrc:   Oral Oral  SpO2: 91%  95% 98%  Weight:    181 lb 12.8 oz (82.5 kg)  Height:    5\' 3"  (1.6 m)    Constitutional: elderly woman, lying in  bed, NAD Vitals:   01/08/17 2230 01/08/17 2314 01/09/17 0128 01/09/17 0131  BP: (!) 155/86  (!) 156/50 (!) 151/64  Pulse: 88  69 78  Resp: (!) 23  18 18   Temp:  98.5 F (36.9 C) 97.4 F (36.3 C) 97.4 F (36.3 C)  TempSrc:   Oral Oral  SpO2: 91%  95% 98%  Weight:    181 lb 12.8 oz (82.5 kg)  Height:    5\' 3"  (1.6 m)   Eyes: PERRL, lids and conjunctivae normal ENMT: Mucous membranes are moist. Posterior pharynx clear of any exudate or lesions. Normal dentition.  Neck: normal, supple, no masses Respiratory: clear to auscultation bilaterally, no wheezing, no crackles. Normal respiratory effort.  Cardiovascular: Normal rate and Irreg Irreg rhythm, no murmurs / rubs / gallops. Non pitting extremity edema. 2+ pedal pulses. Abdomen: no tenderness, no masses palpated. No hepatosplenomegaly. +BS Musculoskeletal: no clubbing / cyanosis. She has changes related to chronic arthritis in the hands.  Normal muscle tone.  Skin: no rashes, ulcers. She has changes of chronic venous stasis on the shins.  Neurologic: CN 2-12 grossly intact. Sensation intact to light touch. Strength 5/5 in all 4.  Psychiatric: Normal judgment and insight. Alert and oriented x 3. Normal mood.    Labs on Admission: I have personally reviewed following labs and imaging studies  CBC:  Recent Labs Lab 01/08/17 1706  WBC 6.8  HGB 11.6*  HCT 39.3  MCV 77.5*  PLT 481   Basic Metabolic Panel:  Recent Labs Lab 01/08/17 1706  NA 137  K 4.0  CL 104  CO2 26  GLUCOSE 116*  BUN 19  CREATININE 1.18*  CALCIUM 8.9   GFR: Estimated Creatinine Clearance: 34.1 mL/min (A) (by C-G formula based on SCr of 1.18 mg/dL (H)). Liver Function Tests:  Recent Labs Lab 01/08/17 1703  AST 21  ALT 11*  ALKPHOS 84  BILITOT 0.7  PROT 6.4*  ALBUMIN 3.4*   No results for input(s): LIPASE, AMYLASE in the last 168 hours. No results for input(s): AMMONIA in the last 168 hours. Coagulation Profile: No results for input(s):  INR, PROTIME in the last 168 hours. Cardiac Enzymes:  Recent Labs Lab 01/08/17 1703  TROPONINI <0.03   BNP (last 3 results) No results for input(s): PROBNP in the last 8760 hours. HbA1C: No results for input(s): HGBA1C in the last 72 hours. CBG:  Recent Labs Lab 01/08/17 1654  GLUCAP 92   Lipid Profile: No results for input(s): CHOL, HDL, LDLCALC, TRIG, CHOLHDL, LDLDIRECT in the last 72 hours. Thyroid Function Tests: No results for input(s): TSH, T4TOTAL, FREET4, T3FREE, THYROIDAB in the last 72 hours. Anemia Panel: No results for input(s): VITAMINB12, FOLATE, FERRITIN, TIBC, IRON, RETICCTPCT in the last 72 hours. Urine analysis:    Component Value Date/Time   COLORURINE STRAW (A) 01/08/2017 1957   APPEARANCEUR CLEAR 01/08/2017 1957   LABSPEC 1.008 01/08/2017 1957   PHURINE 5.0 01/08/2017 1957   GLUCOSEU NEGATIVE 01/08/2017 1957   HGBUR SMALL (A) 01/08/2017 [place] NEGATIVE 01/08/2017 [place] NEGATIVE 01/08/2017 1957   PROTEINUR NEGATIVE 01/08/2017 1957   UROBILINOGEN 1.0 10/20/2014 1614   NITRITE NEGATIVE 01/08/2017 1957   LEUKOCYTESUR NEGATIVE 01/08/2017 1957    Radiological Exams on Admission: Dg Chest 2 View  Result Date: 01/08/2017 CLINICAL DATA:  Acute onset of syncope. Vertigo. Initial encounter. EXAM: CHEST  2 VIEW COMPARISON:  Chest radiograph performed 11/25/2016 FINDINGS: The lungs are well-aerated. Vascular congestion is noted, with minimal bilateral atelectasis. There is no evidence of pleural effusion or pneumothorax. The heart is enlarged. A large hiatal hernia is noted, filled with fluid and air. No acute osseous abnormalities are seen. IMPRESSION: 1. Vascular congestion and cardiomegaly, with minimal bilateral atelectasis. 2. Large hiatal hernia, filled with fluid and air. Electronically Signed   By: Garald Balding M.D.   On: 01/08/2017 18:08    EKG: Independently reviewed. RBBB  and afib.   Assessment/Plan Loss of  consciousness - DDx includes TIA, syncope (cardiac vs. Dehydration vs. Neurological), orthostatic hypotension, micturition syncope (gets lightheaded when having urge to use bathroom), polypharmacy - She has been diagnosed with TIA in similar circumstances in December - I think it is possible she has a cardiac arrhythmia which is undiagnosed given h/o RBBB and Afib, possibly sick sinus syndrome? (in ED HR ranged from 87s - 120s) - CT scan of head - read as chronic disease, no acute stroke - Repeat MRI/MRA brain - she has a known stenosis at M1, possibly a culprit of her passing out - Telemetry  - trend cardiac enzymes, first negative - Check Magnesium - AM EKG - Consider holter monitor as outpatient if telemetry unrevealing - Check orthostatic BP - Consider polypharmacy - hold medications which can cause orthostasis including IMDUR, oxybutynin.  I did continue metoprolol b/c of her h/o atrial fibrillation.  - Repeat TTE  - consider EEG if all other work up negative, consider cardiology and/or neurology consult based on results of above.   Atrial fibrillation on Chronic anticoagulation - Reports adherence to xarelto - Telemetry - Continue beta blocker  H/o Chest pain - She has had LHC in 2008 which showed normal coronaries and MPS in 2015 and 2017 which were normal.  She is on IMDUR for chest pain, continues to have mild "tingling" in her chest on occasion - Hold IMDUR as above - Telemetry - Trend troponin - Nitro PRN for pain  H/O Hypokalemia - She is on supplementation, K today is 4.0 - MOnitor  Urinary incontinence and Gait instability - Unclear cause, ? NPH given she has issues with lifting her feet sometimes, urinary incontinence and episodes of lightheadedness - CT head did not show definitive enlarged ventricles - Currently holding oxybutynin as above - Possibly related mainly to lasix dosing - PT evaluation - Check A1C - neuropathy could certainly cause these symptoms.   Glu 116 on initial BMET.     COPD with acute exacerbation with Chronic respiratory failure with hypoxia - Continue home O2 -she is on no inhalers so I question if this is an accurate diagnosis - Pulse ox in the high 90s, monitor on O2  CKD (baseline 1.0 - 1.2) - She is at baseline today - Holding lasix given above work up for syncope - Restart lasix at lower dose when appropriate    Hyperlipidemia - Continue statin - Check lipid panel  Essential hypertension - BP moderately elevated in the ED - Monitor on telemetry - Holding IMDUR, oxybutynin - Continue metoprolol    GERD (gastroesophageal reflux disease) - Continue PPI    Depression - Continue venlafaxine, zolpidem, prn xanax  Microcytosis - Mildly low Hgb with MCV of 77 - Check iron and ferritin     DVT prophylaxis: Xarelto Code Status: Full Family Communication: Daughters [place] and [place] at bedside Disposition Plan: d/c in 1-2 days based on work up [employer] called: None Admission status: Inpatient, telemetry   Gilles Chiquito MD [facility] Pager [phone]  If 7PM-7AM, please contact night-coverage [email or web address] Password [email or web address]  01/09/2017, 1:59 AM

## 2017-01-08 NOTE — ED Notes (Addendum)
Pt up to BSC

## 2017-01-08 NOTE — ED Notes (Signed)
This RN called CT.  CT tech is printing the results of the CT and bringing them over.

## 2017-01-08 NOTE — ED Notes (Signed)
Pt returned from CT °

## 2017-01-08 NOTE — ED Notes (Signed)
Pt provided with [place] sandwich and coke. Family provided with drinks. Nad.

## 2017-01-08 NOTE — ED Triage Notes (Addendum)
Per EMS pt was sitting at table at home and shut her eyes and did not respond to anyone. She did not fall out of the chair. They thought she stopped breathing so they put her on the floor and gave her 4 rescue breaths and then she started breathing spontaneously. Family states she has had episodes like this in the past last one was 2 months ago. Family states to EMS in December she had "a clot burst at the base of her brain". Upon EMS arrival patient unresponsive pupils were unequal Left was blown and right was 12mm and unreactive. Within a minute patient opened eyes and pupil returned to 90mm equal and reactive and was alert and oriented x4. Patient recalls sitting at the table and the ride in EMS but nothing in between.   Patient has hx of a.fib.  Episode started at 1534.

## 2017-01-08 NOTE — ED Provider Notes (Signed)
[facility] DEPT Provider Note   CSN: [id number] Arrival date & time: 01/08/17  1633     History   Chief Complaint Chief Complaint  Patient presents with  . Unresponsive    HPI Raven Gray is a 83 y.o. female.  Patient had an episode today where she had to close her eyes at the kitchen table was feeling dizzy and had slurred speech and then passed out for 15 minutes. She has had 3 episodes like this before but not lasting this long. She was diagnosed with a TIA once and dehydration a second time    Loss of Consciousness   This is a recurrent problem. The current episode started 3 to 5 hours ago. The problem occurs rarely. The problem has been resolved. She lost consciousness for a period of greater than 5 minutes. The problem is associated with normal activity. Associated symptoms include dizziness and visual change. Pertinent negatives include abdominal pain, back pain, chest pain, congestion, headaches and seizures.    Past Medical History:  Diagnosis Date  . Atrial fibrillation ([place])    a. on Xarelto  . Chest pain   . H/O echocardiogram    a. 08/2016: EF of 60-65%, severely dilated LA, mild pulmonic regurgitations, mild TR, and PA Pressure at 38 mm Hg  . History of depression   . History of DVT (deep vein thrombosis)   . History of pulmonary embolism   . Hyperlipidemia   . Hypertension   . Hypokalemia    Now improved with treatment  . Shortness of breath   . Stroke [facility])     Patient Active Problem List   Diagnosis Date Noted  . Dehydration 11/25/2016  . Acute encephalopathy   . Encephalopathy 11/24/2016  . Dyspnea on exertion 10/29/2016  . Chronic respiratory failure with hypoxia ([place]) 10/29/2016  . Chronic anticoagulation 10/23/2016  . TIA (transient ischemic attack) 09/06/2016  . COPD (chronic obstructive pulmonary disease) ([place]) 09/06/2016  . Essential hypertension 09/06/2016  . GERD (gastroesophageal reflux disease) 09/06/2016  . Depression 09/06/2016    . Hyperlipidemia 10/23/2015  . Aortic insufficiency 12/15/2014  . Edema of both legs 09/19/2014  . Atrial fibrillation ([place]) 11/21/2013  . Chest pain 11/21/2013  . Community acquired pneumonia 06/19/2012  . Pulmonary embolism ([place]) 06/19/2012  . Hypokalemia 06/18/2012  . COPD with acute exacerbation ([place]) 06/18/2012  . Hypoxia 06/18/2012    Past Surgical History:  Procedure Laterality Date  . REPLACEMENT TOTAL KNEE    . ROTATOR CUFF REPAIR      OB History    No data available       Home Medications    Prior to Admission medications   Medication Sig Start Date End Date Taking? Authorizing Provider  acetaminophen (TYLENOL) 325 MG tablet Take 650 mg by mouth every 6 (six) hours as needed (for pain).     Historical Provider, MD  ALPRAZolam Duanne Moron) 0.25 MG tablet Take 1 tablet (0.25 mg total) by mouth 2 (two) times daily as needed for anxiety. 10/21/14   Maryann Mikhail, DO  furosemide (LASIX) 40 MG tablet Take 1.5 tablets (60 mg total) by mouth every morning. 11/27/16   Eugenie Filler, MD  isosorbide mononitrate (IMDUR) 60 MG 24 hr tablet Take 1 tablet (60 mg total) by mouth daily. 02/29/16   Lorretta Harp, MD  metoprolol (LOPRESSOR) 25 MG tablet Take 1 tablet (25 mg total) by mouth 2 (two) times daily. 06/20/14   Lorretta Harp, MD  Multiple Vitamins-Minerals (WOMENS 50+  MULTI VITAMIN/MIN) TABS Take 1 tablet by mouth daily.    Historical Provider, MD  nitroGLYCERIN (NITROSTAT) 0.4 MG SL tablet Place 0.4 mg under the tongue as needed for chest pain. X 3 doses 02/08/16   Historical Provider, MD  omeprazole (PRILOSEC) 20 MG capsule Take 20 mg by mouth daily as needed (for reflux/heartburn).  04/23/15   Historical Provider, MD  oxybutynin (DITROPAN-XL) 5 MG 24 hr tablet Take 5 mg by mouth daily. 07/02/15   Historical Provider, MD  OXYGEN Inhale 2 L into the lungs continuous.    Historical Provider, MD  potassium chloride SA (K-DUR,KLOR-CON) 20 MEQ tablet Take 60 mEq by mouth every  morning.     Historical Provider, MD  pravastatin (PRAVACHOL) 40 MG tablet Take 40 mg by mouth every morning.     Historical Provider, MD  Rivaroxaban (XARELTO) 15 MG TABS tablet Take 1 tablet (15 mg total) by mouth every morning. 12/04/16   Lorretta Harp, MD  venlafaxine XR (EFFEXOR-XR) 150 MG 24 hr capsule Take 150 mg by mouth daily.    Historical Provider, MD  zolpidem (AMBIEN) 5 MG tablet Take 5 mg by mouth at bedtime. 11/03/13   Historical Provider, MD    Family History Family History  Problem Relation Age of Onset  . Heart attack Father   . Diabetes type II Daughter   . Heart disease Daughter     stents    Social History Social History  Substance Use Topics  . Smoking status: Never Smoker  . Smokeless tobacco: Never Used  . Alcohol use No     Allergies   Patient has no known allergies.   Review of Systems Review of Systems  Constitutional: Negative for appetite change and fatigue.  HENT: Negative for congestion, ear discharge and sinus pressure.   Eyes: Negative for discharge.  Respiratory: Negative for cough.   Cardiovascular: Positive for syncope. Negative for chest pain.  Gastrointestinal: Negative for abdominal pain and diarrhea.  Genitourinary: Negative for frequency and hematuria.  Musculoskeletal: Negative for back pain.  Skin: Negative for rash.  Neurological: Positive for dizziness. Negative for seizures and headaches.  Psychiatric/Behavioral: Negative for hallucinations.     Physical Exam Updated Vital Signs BP (!) 155/86   Pulse 88   Temp 98.5 F (36.9 C) (Oral)   Resp (!) 23   Ht 5\' 3"  (1.6 m)   Wt 184 lb (83.5 kg)   SpO2 91%   BMI 32.59 kg/m   Physical Exam  Constitutional: She is oriented to person, place, and time. She appears well-developed.  HENT:  Head: Normocephalic.  Eyes: Conjunctivae and EOM are normal. No scleral icterus.  Neck: Neck supple. No thyromegaly present.  Cardiovascular: Normal rate and regular rhythm.  Exam  reveals no gallop and no friction rub.   No murmur heard. Pulmonary/Chest: No stridor. She has no wheezes. She has no rales. She exhibits no tenderness.  Abdominal: She exhibits no distension. There is no tenderness. There is no rebound.  Musculoskeletal: Normal range of motion. She exhibits no edema.  Lymphadenopathy:    She has no cervical adenopathy.  Neurological: She is oriented to person, place, and time. She exhibits normal muscle tone. Coordination normal.  Skin: No rash noted. No erythema.  Psychiatric: She has a normal mood and affect. Her behavior is normal.     ED Treatments / Results  Labs (all labs ordered are listed, but only abnormal results are displayed) Labs Reviewed  BASIC METABOLIC PANEL - Abnormal; Notable  for the following:       Result Value   Glucose, Bld 116 (*)    Creatinine, Ser 1.18 (*)    GFR calc non Af Amer 40 (*)    GFR calc Af Amer 47 (*)    All other components within normal limits  CBC - Abnormal; Notable for the following:    Hemoglobin 11.6 (*)    MCV 77.5 (*)    MCH 22.9 (*)    MCHC 29.5 (*)    RDW 17.8 (*)    All other components within normal limits  URINALYSIS, ROUTINE W REFLEX MICROSCOPIC - Abnormal; Notable for the following:    Color, Urine STRAW (*)    Hgb urine dipstick SMALL (*)    Bacteria, UA RARE (*)    Squamous Epithelial / LPF 0-5 (*)    All other components within normal limits  HEPATIC FUNCTION PANEL - Abnormal; Notable for the following:    Total Protein 6.4 (*)    Albumin 3.4 (*)    ALT 11 (*)    All other components within normal limits  TROPONIN I  CBG MONITORING, ED    EKG  EKG Interpretation  Date/Time:  Thursday January 08 2017 16:47:27 EDT Ventricular Rate:  66 PR Interval:    QRS Duration: 159 QT Interval:  437 QTC Calculation: 458 R Axis:   82 Text Interpretation:  Atrial fibrillation Right bundle branch block Confirmed by Zhaire Locker  MD, Maryela Tapper [phone]) on 01/08/2017 5:17:37 PM       Radiology Dg  Chest 2 View  Result Date: 01/08/2017 CLINICAL DATA:  Acute onset of syncope. Vertigo. Initial encounter. EXAM: CHEST  2 VIEW COMPARISON:  Chest radiograph performed 11/25/2016 FINDINGS: The lungs are well-aerated. Vascular congestion is noted, with minimal bilateral atelectasis. There is no evidence of pleural effusion or pneumothorax. The heart is enlarged. A large hiatal hernia is noted, filled with fluid and air. No acute osseous abnormalities are seen. IMPRESSION: 1. Vascular congestion and cardiomegaly, with minimal bilateral atelectasis. 2. Large hiatal hernia, filled with fluid and air. Electronically Signed   By: Garald Balding M.D.   On: 01/08/2017 18:08    Procedures Procedures (including critical care time)  Medications Ordered in ED Medications - No data to display   Initial Impression / Assessment and Plan / ED Course  I have reviewed the triage vital signs and the nursing notes.  Pertinent labs & imaging results that were available during my care of the patient were reviewed by me and considered in my medical decision making (see chart for details).     Patient with syncopal episode possible TIA she'll be admitted to medicine  Final Clinical Impressions(s) / ED Diagnoses   Final diagnoses:  Syncope and collapse    New Prescriptions New Prescriptions   No medications on file     Milton Ferguson, MD 01/08/17 2310

## 2017-01-09 ENCOUNTER — Encounter (HOSPITAL_COMMUNITY): Payer: Self-pay | Admitting: Internal Medicine

## 2017-01-09 ENCOUNTER — Inpatient Hospital Stay (HOSPITAL_COMMUNITY): Payer: Medicare Other

## 2017-01-09 ENCOUNTER — Telehealth: Payer: Self-pay | Admitting: Cardiovascular Disease

## 2017-01-09 ENCOUNTER — Other Ambulatory Visit (HOSPITAL_COMMUNITY): Payer: Self-pay

## 2017-01-09 DIAGNOSIS — R55 Syncope and collapse: Secondary | ICD-10-CM

## 2017-01-09 DIAGNOSIS — J441 Chronic obstructive pulmonary disease with (acute) exacerbation: Secondary | ICD-10-CM

## 2017-01-09 DIAGNOSIS — Z7901 Long term (current) use of anticoagulants: Secondary | ICD-10-CM | POA: Diagnosis not present

## 2017-01-09 DIAGNOSIS — R42 Dizziness and giddiness: Secondary | ICD-10-CM | POA: Diagnosis not present

## 2017-01-09 DIAGNOSIS — I639 Cerebral infarction, unspecified: Secondary | ICD-10-CM

## 2017-01-09 DIAGNOSIS — R4781 Slurred speech: Secondary | ICD-10-CM | POA: Diagnosis not present

## 2017-01-09 LAB — IRON AND TIBC
Iron: 24 ug/dL — ABNORMAL LOW (ref 28–170)
SATURATION RATIOS: 5 % — AB (ref 10.4–31.8)
TIBC: 441 ug/dL (ref 250–450)
UIBC: 417 ug/dL

## 2017-01-09 LAB — LIPID PANEL
CHOL/HDL RATIO: 2.9 ratio
CHOLESTEROL: 115 mg/dL (ref 0–200)
HDL: 40 mg/dL — AB (ref >40)
LDL Cholesterol: 58 mg/dL (ref 0–99)
TRIGLYCERIDES: 86 mg/dL (ref <150)
VLDL: 17 mg/dL (ref 0–40)

## 2017-01-09 LAB — BASIC METABOLIC PANEL
Anion gap: 9 (ref 5–15)
BUN: 15 mg/dL (ref 6–20)
CALCIUM: 8.8 mg/dL — AB (ref 8.9–10.3)
CO2: 28 mmol/L (ref 22–32)
CREATININE: 1.06 mg/dL — AB (ref 0.44–1.00)
Chloride: 104 mmol/L (ref 101–111)
GFR calc non Af Amer: 46 mL/min — ABNORMAL LOW (ref >60)
GFR, EST AFRICAN AMERICAN: 53 mL/min — AB (ref >60)
Glucose, Bld: 117 mg/dL — ABNORMAL HIGH (ref 65–99)
Potassium: 3.7 mmol/L (ref 3.5–5.1)
SODIUM: 141 mmol/L (ref 135–145)

## 2017-01-09 LAB — TROPONIN I
Troponin I: <0.03 ng/mL (ref <0.03)
Troponin I: <0.03 ng/mL (ref <0.03)
Troponin I: <0.03 ng/mL (ref <0.03)

## 2017-01-09 LAB — MAGNESIUM: MAGNESIUM: 1.7 mg/dL (ref 1.7–2.4)

## 2017-01-09 LAB — FERRITIN: Ferritin: 13 ng/mL (ref 11–307)

## 2017-01-09 MED ORDER — ACETAMINOPHEN 325 MG PO TABS: 650.0000 mg | ORAL_TABLET | ORAL | Status: DC | PRN

## 2017-01-09 MED ORDER — PRAVASTATIN SODIUM 40 MG PO TABS: 40.0000 mg | ORAL_TABLET | Freq: Every morning | ORAL | Status: DC

## 2017-01-09 MED ORDER — ACETAMINOPHEN 650 MG RE SUPP: 650.0000 mg | RECTAL | Status: DC | PRN

## 2017-01-09 MED ORDER — NITROGLYCERIN 0.4 MG SL SUBL: 0.4000 mg | SUBLINGUAL_TABLET | SUBLINGUAL | Status: DC | PRN

## 2017-01-09 MED ORDER — SENNOSIDES-DOCUSATE SODIUM 8.6-50 MG PO TABS: 1.0000 | ORAL_TABLET | Freq: Every evening | ORAL | Status: DC | PRN

## 2017-01-09 MED ORDER — POTASSIUM CHLORIDE CRYS ER 20 MEQ PO TBCR
60.0000 meq | EXTENDED_RELEASE_TABLET | Freq: Every morning | ORAL | Status: DC
  Administered 2017-01-09 – 2017-01-10 (×2): 60 meq via ORAL
  Filled 2017-01-09 (×3): qty 3

## 2017-01-09 MED ORDER — LEVETIRACETAM 250 MG PO TABS
250.0000 mg | ORAL_TABLET | Freq: Two times a day (BID) | ORAL | Status: DC
  Administered 2017-01-09 – 2017-01-10 (×2): 250 mg via ORAL
  Filled 2017-01-09 (×2): qty 1

## 2017-01-09 MED ORDER — VENLAFAXINE HCL ER 75 MG PO CP24
150.0000 mg | ORAL_CAPSULE | Freq: Every day | ORAL | Status: DC
  Administered 2017-01-09 – 2017-01-10 (×2): 150 mg via ORAL
  Filled 2017-01-09 (×2): qty 2

## 2017-01-09 MED ORDER — PRAVASTATIN SODIUM 40 MG PO TABS
40.0000 mg | ORAL_TABLET | Freq: Every day | ORAL | Status: DC
  Administered 2017-01-09: 40 mg via ORAL
  Filled 2017-01-09: qty 1

## 2017-01-09 MED ORDER — RIVAROXABAN 15 MG PO TABS
15.0000 mg | ORAL_TABLET | Freq: Every morning | ORAL | Status: DC
Start: 2017-01-09 — End: 2017-01-10
  Administered 2017-01-09 – 2017-01-10 (×2): 15 mg via ORAL
  Filled 2017-01-09 (×2): qty 1

## 2017-01-09 MED ORDER — ADULT MULTIVITAMIN W/MINERALS CH
1.0000 | ORAL_TABLET | Freq: Every day | ORAL | Status: DC
Start: 2017-01-09 — End: 2017-01-10
  Administered 2017-01-09 – 2017-01-10 (×2): 1 via ORAL
  Filled 2017-01-09 (×2): qty 1

## 2017-01-09 MED ORDER — STROKE: EARLY STAGES OF RECOVERY BOOK
Freq: Once | Status: AC
  Administered 2017-01-09: 02:00:00
  Filled 2017-01-09: qty 1

## 2017-01-09 MED ORDER — ALPRAZOLAM 0.25 MG PO TABS: 0.2500 mg | ORAL_TABLET | Freq: Two times a day (BID) | ORAL | Status: DC | PRN

## 2017-01-09 MED ORDER — ZOLPIDEM TARTRATE 5 MG PO TABS
5.0000 mg | ORAL_TABLET | Freq: Every day | ORAL | Status: DC
  Administered 2017-01-09 (×2): 5 mg via ORAL
  Filled 2017-01-09 (×2): qty 1

## 2017-01-09 MED ORDER — SODIUM CHLORIDE 0.9 % IV SOLN
INTRAVENOUS | Status: AC
  Administered 2017-01-09: 02:00:00 via INTRAVENOUS

## 2017-01-09 MED ORDER — PANTOPRAZOLE SODIUM 40 MG PO TBEC
40.0000 mg | DELAYED_RELEASE_TABLET | Freq: Every day | ORAL | Status: DC
  Administered 2017-01-09 – 2017-01-10 (×2): 40 mg via ORAL
  Filled 2017-01-09 (×2): qty 1

## 2017-01-09 MED ORDER — ACETAMINOPHEN 160 MG/5ML PO SOLN
650.0000 mg | ORAL | Status: DC | PRN
Start: 2017-01-09 — End: 2017-01-10

## 2017-01-09 NOTE — Care Management CC44 (Signed)
Condition Code 44 Documentation Completed  Patient Details  Name: Raven Gray MRN: [id number] Date of Birth: 09-28-1927   Condition Code 44 given:  Yes Patient signature on Condition Code 44 notice:  Yes Documentation of 2 MD's agreement:  Yes Code 44 added to claim:  Yes    Carles Collet, RN 01/09/2017, 4:04 PM

## 2017-01-09 NOTE — Evaluation (Signed)
Physical Therapy Evaluation Patient Details Name: Raven Gray MRN: [id number] DOB: 1928-02-24 Today's Date: 01/09/2017   History of Present Illness   83 y.o. female who presented to the ED with sudden LOC, received some rescue breaths and then started breathing normally.  Had similar episodes March and previous weakness with walking coming on.  PMH: afib on xarelto,  DVT/PE, HTN, TIA, CVA, COPD  Clinical Impression  Pt is up to walk with PT and demonstrates some unsteadiness but also light headed still.  Was placed on chair alarm and MD came in to allow for PT to update progress, and will anticipate her return home with daughter to assist.  If family cannot assist her may need to replan dc to SNF for follow up unless pt is more stable with mobility.  Follow acutely to work on strength of hips and control of her standing with walker, progress gait as able.   Follow Up Recommendations Home health PT;Supervision for mobility/OOB    Equipment Recommendations  None recommended by PT    Recommendations for Other Services       Precautions / Restrictions Precautions Precautions: Fall (telemetry connections on but not attached to wires) Restrictions Weight Bearing Restrictions: No      Mobility  Bed Mobility Overal bed mobility: Needs Assistance Bed Mobility: Supine to Sit     Supine to sit: Min assist;Mod assist     General bed mobility comments: assisted under trunk to get to side of bed  Transfers Overall transfer level: Needs assistance Equipment used: Rolling walker (2 wheeled);1 person hand held assist Transfers: Sit to/from [facility] Sit to Stand: Min assist Stand pivot transfers: Min assist       General transfer comment: reminders for set up of hand placement  Ambulation/Gait Ambulation/Gait assistance: Min assist Ambulation Distance (Feet): 4 Feet Assistive device: Rolling walker (2 wheeled) Gait Pattern/deviations: Step-to pattern;Decreased stride  length;Narrow base of support;Trunk flexed (limited by light headed feelings, pulse 105 and sat 97%) Gait velocity: reduced Gait velocity interpretation: Below normal speed for age/gender    Stairs            Wheelchair Mobility    Modified Rankin (Stroke Patients Only)       Balance Overall balance assessment: Needs assistance Sitting-balance support: Feet supported Sitting balance-Leahy Scale: Fair     Standing balance support: Bilateral upper extremity supported Standing balance-Leahy Scale: Poor                               Pertinent Vitals/Pain Pain Assessment: No/denies pain    Home Living Family/patient expects to be discharged to:: Private residence Living Arrangements: Alone Available Help at Discharge: Family;Available 24 hours/day Type of Home: House Home Access: Ramped entrance     Home Layout: One level Home Equipment: Walker - 2 wheels;Wheelchair - manual;Cane - single point;Bedside commode      Prior Function Level of Independence: Independent with assistive device(s)         Comments: SPC and RW or rollator to walk     Hand Dominance   Dominant Hand: Right    Extremity/Trunk Assessment   Upper Extremity Assessment Upper Extremity Assessment: Overall WFL for tasks assessed    Lower Extremity Assessment Lower Extremity Assessment: Generalized weakness (hips are weak)    Cervical / Trunk Assessment Cervical / Trunk Assessment: Normal  Communication   Communication: No difficulties  Cognition Arousal/Alertness: Awake/alert Behavior During Therapy: [facility]  for tasks assessed/performed Overall Cognitive Status: Within Functional Limits for tasks assessed                                        General Comments General comments (skin integrity, edema, etc.): Pt up to chair with assistance, has limited ability to control walker and set up so did have coaching for safety and sequence    Exercises      Assessment/Plan    PT Assessment Patient needs continued PT services  PT Problem List Decreased strength;Decreased range of motion;Decreased activity tolerance;Decreased balance;Decreased mobility;Decreased safety awareness;Decreased knowledge of precautions;Cardiopulmonary status limiting activity       PT Treatment Interventions Gait training;DME instruction;Functional mobility training;Therapeutic activities;Therapeutic exercise;Balance training;Neuromuscular re-education;Patient/family education    PT Goals (Current goals can be found in the Care Plan section)  Acute Rehab PT Goals Patient Stated Goal: to walk and feel stronger PT Goal Formulation: With patient Time For Goal Achievement: 01/23/17 Potential to Achieve Goals: Good    Frequency Min 3X/week   Barriers to discharge Other (comment) (has level home with daughter to stay with her)      Co-evaluation               End of Session Equipment Utilized During Treatment: Gait belt Activity Tolerance: Patient tolerated treatment well;Patient limited by fatigue;Other (comment) (light headed but better once in chair) Patient left: in chair;with call bell/phone within reach;with chair alarm set;Other (comment) (hospitalist stepped in) Nurse Communication: Mobility status PT Visit Diagnosis: Unsteadiness on feet (R26.81);Muscle weakness (generalized) (M62.81);Difficulty in walking, not elsewhere classified (R26.2)    Time: 3716-9678 PT Time Calculation (min) (ACUTE ONLY): 27 min   Charges:   PT Evaluation $PT Eval Moderate Complexity: 1 Procedure PT Treatments $Gait Training: 8-22 mins   PT G Codes:   PT G-Codes **NOT FOR INPATIENT CLASS** Functional Assessment Tool Used: AM-PAC 6 Clicks Basic Mobility    Ramond Dial 01/09/2017, 9:06 AM   Mee Hives, PT MS Acute Rehab Dept. Number: [address]

## 2017-01-09 NOTE — Progress Notes (Signed)
Pt arrived to [facility]. Ambulated from stretcher to bed. Alert and oriented x 4, denied pain and discomfort, no singns of acute distress. Cardiac monitor place. Pt advised about valuable policy and instructed to call for assistance. Bed alarm activated, and call bell within reach. Will continue to monitor and treat pt.

## 2017-01-09 NOTE — Consult Note (Signed)
Referring Physician: Dr Broadus John    Chief Complaint:   HPI: Raven Gray is an 83 y.o. female with a history of a TIA, COPD, PE/DVT, hyperlipidemia, Afib on Xarelto, depression, generalized weakness and gait difficulties admitted to [facility] on 01/08/2017 for an episode of loss of consciousness which lasted approximately 15 minutes. The patient has had two similar episodes in the past. Neurology was previously, consulted on 08/27/2016 for evaluation of dysarthria, blurred vision, and transient unresponsiveness. At that time it was felt that the patient had experienced a TIA secondary to atrial fibrillation despite being on Xarelto therapy. An EEG was performed on 10/20/2016 by Dr [name] at the [facility] office which was interpreted as normal. Dr. [name] had seen the patient in follow-up in the office on 11/18/2016 and recommended continuing the Xarelto. Apparently the patient had not been taking the medication consistently. Dr. [name] later saw the patient in consult on 11/24/2016 for encephalopathy felt secondary to dehydration with a possible TIA. An EEG was repeated on 11/25/2016 and once again this was interpreted as normal.  She has been followed in the past by Dr. Quay Burow for her cardiac issues. The patient's daughter reports that the patient is now compliant with her Xarelto. The daughter gives her the medication faithfully with food. Neurology has been consulted for loss of consciousness.  I had a long talk with the patient's daughter. She gave most of the history. The daughter describes two separate types of episodes experienced by the patient.  The first episode has occurred on three different occasions including yesterday. This usually occurs at rest. The patient begins to slur her speech. She closes her eyes. She then goes into a "deep sleep". With yesterday's episode her breathing became very shallow. Her son-in-law placed her on the floor and performed CPR for several minutes. The  patient came back around. The whole thing lasted approximately 15 minutes. She has had two other similar episodes without CPR.  The second type of episode described occurs often while walking. The patient becomes anxious, short of breath, and develops urinary urgency. She does not lose consciousness with these episodes. She often has problems with her balance. The patient describes a "quivering" feeling in her abdomen and chest with these episodes. These episodes often occur daily or several times a day.  The patient lives alone Her daughter lives close by and checks on her frequently.   Date last known well: Date: 01/08/2017 Time last known well: Time: 15:00 tPA Given:  No - on Xarelto - late presentation - no acute stroke  Past Medical History:  Diagnosis Date  . Atrial fibrillation ([place])    a. on Xarelto  . Chest pain   . COPD (chronic obstructive pulmonary disease) ([place])   . H/O echocardiogram    a. 08/2016: EF of 60-65%, severely dilated LA, mild pulmonic regurgitations, mild TR, and PA Pressure at 38 mm Hg  . History of depression   . History of DVT (deep vein thrombosis)   . History of pulmonary embolism   . Hyperlipidemia   . Hypertension   . Hypokalemia    Now improved with treatment  . Shortness of breath   . Stroke [facility])     Past Surgical History:  Procedure Laterality Date  . REPLACEMENT TOTAL KNEE    . ROTATOR CUFF REPAIR      Family History  Problem Relation Age of Onset  . Alzheimer's disease Mother   . Heart attack Father   . Diabetes  type II Daughter   . Heart disease Daughter     stents   Social History:  reports that she has never smoked. She has never used smokeless tobacco. She reports that she does not drink alcohol or use drugs.  Allergies: No Known Allergies  Medications:  Scheduled: . multivitamin with minerals  1 tablet Oral Daily  . pantoprazole  40 mg Oral Daily  . potassium chloride SA  60 mEq Oral q morning - 10a  . pravastatin  40 mg  Oral q1800  . Rivaroxaban  15 mg Oral q morning - 10a  . venlafaxine XR  150 mg Oral Daily  . zolpidem  5 mg Oral QHS    ROS: History obtained from Daughter  General ROS: negative for - chills, fatigue, fever, night sweats, weight gain or weight loss Psychological ROS: negative for - behavioral disorder, hallucinations, memory difficulties, mood swings or suicidal ideation. Anxiety at baseline. Ophthalmic ROS: negative for - blurry vision, double vision, eye pain or loss of vision ENT ROS: negative for - epistaxis, nasal discharge, oral lesions, sore throat, tinnitus or vertigo Allergy and Immunology ROS: negative for - hives or itchy/watery eyes Hematological and Lymphatic ROS: negative for - bleeding problems, bruising or swollen lymph nodes Endocrine ROS: negative for - galactorrhea, hair pattern changes, polydipsia/polyuria or temperature intolerance Respiratory ROS: negative for - cough, hemoptysis, shortness of breath or wheezing Cardiovascular ROS: negative for - chest pain, dyspnea on exertion, edema or irregular heartbeat Gastrointestinal ROS: negative for - abdominal pain, diarrhea, hematemesis, nausea/vomiting or stool incontinence Genito-Urinary ROS: negative for - dysuria, hematuria, incontinence or urinary frequency/urgency Musculoskeletal ROS: negative for - joint swelling or muscular weakness Neurological ROS: as noted in HPI Dermatological ROS: negative for rash and skin lesion changes   Physical Examination: Blood pressure (!) 143/79, pulse 64, temperature 98.5 F (36.9 C), temperature source Axillary, resp. rate 16, height 5\' 3"  (1.6 m), weight 82.5 kg (181 lb 12.8 oz), SpO2 95 %.  General - Pleasant 83 yo female in NAD. Heart - Irregularly irregular - no murmer appreciated. Lungs - Clear to auscultation Abdomen - Soft - non tender Extremities - Distal pulses intact - no edema Skin - Warm and dry  Neurologic Examination:  Mental Status:  Alert, oriented with  cuing. thought content appropriate. Speech without evidence of dysarthria or aphasia. Able to follow 3 step commands without difficulty.  Cranial Nerves:  II-bilateral visual fields intact III/IV/VI-Pupils were equal and reacted. Extraocular movements were full.  V/VII-no facial numbness and no facial weakness.  VIII-hearing normal.  X-normal speech and symmetrical palatal movement.  XII-midline tongue extension  Motor: 5/5 strength symmetrical throughout.  Muscle tone normal throughout. Sensory: Intact to light touch in all extremities. Deep Tendon Reflexes: 1/4 throughout Plantars: Downgoing bilaterally  Cerebellar: Normal finger to nose. Gait: not tested   Laboratory Studies:  Basic Metabolic Panel:  Recent Labs Lab 01/08/17 1706 01/09/17 0839  NA 137 141  K 4.0 3.7  CL 104 104  CO2 26 28  GLUCOSE 116* 117*  BUN 19 15  CREATININE 1.18* 1.06*  CALCIUM 8.9 8.8*  MG  --  1.7    Liver Function Tests:  Recent Labs Lab 01/08/17 1703  AST 21  ALT 11*  ALKPHOS 84  BILITOT 0.7  PROT 6.4*  ALBUMIN 3.4*   No results for input(s): LIPASE, AMYLASE in the last 168 hours. No results for input(s): AMMONIA in the last 168 hours.  CBC:  Recent Labs Lab 01/08/17 1706  WBC 6.8  HGB 11.6*  HCT 39.3  MCV 77.5*  PLT 205    Cardiac Enzymes:  Recent Labs Lab 01/08/17 1703 01/09/17 0356 01/09/17 0839  TROPONINI <0.03 <0.03 <0.03    BNP: Invalid input(s): POCBNP  CBG:  Recent Labs Lab 01/08/17 1654  GLUCAP 32    Microbiology: Results for orders placed or performed during the hospital encounter of 10/20/14  Urine culture     Status: None   Collection Time: 10/20/14  4:14 PM  Result Value Ref Range Status   Specimen Description URINE, CLEAN CATCH  Final   Special Requests ADDED [id number]  Final   Colony Count   Final    >=100,000 COLONIES/ML Performed at [facility]    Culture   Final    Multiple bacterial morphotypes present, none  predominant. Suggest appropriate recollection if clinically indicated. Performed at [facility]    Report Status 10/22/2014 FINAL  Final    Coagulation Studies: No results for input(s): LABPROT, INR in the last 72 hours.  Urinalysis:  Recent Labs Lab 01/08/17 1957  COLORURINE STRAW*  LABSPEC 1.008  PHURINE 5.0  GLUCOSEU NEGATIVE  HGBUR SMALL*  BILIRUBINUR NEGATIVE  KETONESUR NEGATIVE  PROTEINUR NEGATIVE  NITRITE NEGATIVE  LEUKOCYTESUR NEGATIVE    Lipid Panel:    Component Value Date/Time   CHOL 115 01/09/2017 0356   TRIG 86 01/09/2017 0356   HDL 40 (L) 01/09/2017 0356   CHOLHDL 2.9 01/09/2017 0356   VLDL 17 01/09/2017 0356   LDLCALC 58 01/09/2017 0356    HgbA1C:  Lab Results  Component Value Date   HGBA1C 6.2 (H) 09/07/2016    Urine Drug Screen:     Component Value Date/Time   LABOPIA NONE DETECTED 11/24/2016 1720   COCAINSCRNUR NONE DETECTED 11/24/2016 1720   LABBENZ NONE DETECTED 11/24/2016 1720   AMPHETMU NONE DETECTED 11/24/2016 1720   THCU NONE DETECTED 11/24/2016 1720   LABBARB NONE DETECTED 11/24/2016 1720    Alcohol Level: No results for input(s): ETH in the last 168 hours.  Other results: EKG: Atrial fibrillation - ventricular response 66 bpm. Please see cardiology interpretation for complete details.  Imaging:   Dg Chest 2 View 01/08/2017 1. Vascular congestion and cardiomegaly, with minimal bilateral atelectasis.  2. Large hiatal hernia, filled with fluid and air.    Ct Head Wo Contrast 01/08/2017 Mild diffuse cortical atrophy. Minimal chronic ischemic white matter disease. No acute intracranial abnormality seen.     Mr Jodene Nam Head/brain Wo Cm 01/09/2017 1. No acute infarct. Subacute appearing ischemia in the medial left occipital lobe, superimposed on a chronic inferior left occipital lobe infarct. No associated hemorrhage or mass effect.  2. Stable intracranial MRA since 2017. No posterior circulation stenosis or occlusion.  Chronic moderate left MCA M1 segment stenosis.  3. Stable chronic small vessel disease in the cerebral white matter, right deep gray matter, and right cerebellum.    Assessment: 83 y.o. female with history of syncope, TIA, COPD, PE/DVT, hyperlipidemia, Afib on Xarelto, depression, generalized weakness and gait difficulties were now presents with subacute appearing ischemia in the left middle occipital lobe superimposed on a chronic inferior left occipital lobe infarct.   Stroke Risk Factors - atrial fibrillation on anticoagulation, previous TIAs, hyperlipidemia,  Plan: 1. HgbA1c, fasting lipid panel - ordered 2. MRI, MRA  of the brain without contrast - done 3. PT consult, OT consult, Speech consult - ordered 4. Echocardiogram - pending 5. Carotid dopplers - 09/07/16 1-39% ICA plaquing. Vertebral  artery flow is antegrade. 6. Prophylactic therapy- continue Xarelto 7. NPO until RN stroke swallow screen -> hear healthy diet 8. Telemetry monitoring 9. Frequent neuro checks  For recommendations to follow per attending neurologist   Mikey Bussing PA-C [facility] Pager [phone] 01/09/2017, 1:53 PM  Neurology Attending Addendum  This patient was seen, examined, and d/w PA. I have reviewed the note and agree with the findings, assessment and plan as documented with the following additions.   In brief, this is an 83 year old woman with spells. As noted by PA, she has 2 discrete types. One of these consists of slurred speech followed by unresponsiveness. When she wakes up, she is confused and it takes her several minutes to return to baseline. She had her third such episode yesterday and her daughter at the bedside and reports that this was the most severe episode to date. During this episode, she had her breathing became very shallow which has not been observed in the past. She also took about 15 minutes to return to baseline rather than the normal 5. No report of any  abnormal motor activity during this episode.  Her other spells consist of loss of strength in her legs and urinary incontinence when she is walking. There is no loss of consciousness with these episodes though she does state that she feels lightheaded and that if she doesn't sit down and she will pass out. She has been noted to be orthostatic during this admission.  Exam is as noted by PA. No focal neurologic deficits are observed. I agree with his findings in totality.  Imaging: I personally and independently reviewed the MRI scan of the brain without contrast from today. This shows a faint area of restricted diffusion in the left parieto-occipital region with corresponding T2/flair hyperintensity. This is most suggestive of a subacute ischemic infarction. No obvious acute infarcts are noted. She has a moderate burden of chronic small vessel ischemic disease in the bihemispheric white matter. There is mild to moderate diffuse generalized atrophy with commensurate ex vacuo dilatation of the ventricles. No frank hydrocephalus is appreciated.  I personally and independently reviewed the MRA of the head without contrast from today. This shows no evidence of occlusion. There is some stenosis of the left M1 which is moderate in nature.  Pertinent labs: BMP notable for creatinine 1.06, glucose 117 Troponins have been negative  4 Magnesium 1.7 Cholesterol 115, triglycerides 86, LDL 58, HDL 40 Urinalysis notable for small hemoglobin CBC notable for hemoglobin 11.6, MCV 77.5 Hemoglobin A1c pending  Impression: 1. Subacute ischemic stroke 2. Possible seizure 3. Orthostatic hypotension with near syncope 4. Cerebrovascular disease  Recommendations: As per PA note. My concern is that the episodes that are associated with loss of consciousness may represent complex partial seizures. She seems to have an aura of an unusual sensation in her gut followed by slurred speech and loss of consciousness. She is  briefly postictal after the spell. She is now had 3 of these episodes that seem fairly stereotypical by description. She's had 2 EEGs that have been unrevealing. It may be worth an empiric trial of an antiepileptic agent. This was discussed with the patient and her daughters. They are agreeable to trying a medication. I discussed Keppra and its main side effects, specifically sedation and irritability. They would like to try. I will initiate Keppra 250 mg twice daily, starting tonight. They were advised to track the patient's spells, as I would expect that these should resolve with the  Keppra if they are truly seizures. She is being started on a very low dose and this may need to be increased if spells continue.  Some of her episodes are likely due to orthostatic hypotension. I do not believe that she has normal pressure hydrocephalus given the appearance of ventricles on imaging. History is also not highly suggestive of this diagnosis. Her primary attending is adjusting blood pressure medications in light of her orthostasis. Will defer management to her.  Regarding her stroke, this is likely incidental and probably doesn't explain any of her episodes. She doesn't have any obvious deficits referable to this stroke. She has atrial fibrillation and is on Xarelto and this should be continued. Recent carotid Dopplers in December 2017 were unrevealing and I don't think there is any use in repeating these at this time. Echo is pending. Will have stroke team follow-up in a.m.

## 2017-01-09 NOTE — Telephone Encounter (Signed)
She would like to know if Dr Gwenlyn Found would do orders for this pt's home health care. She needs these by 4:45 today please if possible.

## 2017-01-09 NOTE — Telephone Encounter (Signed)
Message routed to MD

## 2017-01-09 NOTE — Care Management Obs Status (Signed)
[place] NOTIFICATION   Patient Details  Name: Raven Gray MRN: [id number] Date of Birth: [birth year]   Medicare Observation Status Notification Given:  Yes    Carles Collet, RN 01/09/2017, 4:04 PM

## 2017-01-09 NOTE — Progress Notes (Signed)
PROGRESS NOTE    Raven Gray  QPR:[id number] DOB: 04/01/1928 DOA: 01/08/2017 PCP: [name], MD  Brief Narrative:Raven Gray is a 83 y.o. female with medical history significant of TIA, HTN, HLD, PE/DVT, Afib on Xarelto, Depression, urinary incontinence, generalized weakness and gait difficulty, COPD in a never smoker who presents for an episode of LOC.   While patient was visiting at her son's house, she was sitting at the table eating crackers when she felt like "an attack" was coming on.  She closed her eyes and could not reopen them.  She started having slowed and slurred speech, shallow breathing and loss of consciousness. She was laid on the floor and required 3 rescue breaths by her SIL and then she started breathing normally.  She started to come back around about 15 min later when EMS was getting her ready to transport to this hospital.  She has had 2 episodes like this in the past, first in December when she was diagnosed with a TIA.     Assessment & Plan:     Syncope -suspect above episode likely related to Syncope due to Orthostatic hypotension -BP dropped from 156 to 126 on standing -continue IVF, she is on high dose imdur which can contribute to this, now held resume at lower dose at DC -lasix on hold -MRI notable for subacute CVA-suspect this is an incidental finding  Subacute CVA -suspect this is incidental, she is on Xarelto, and reports compliance -known H/o AFib -continue xarelto -will ask Neuro for input -not sure what to make of her intermittent spells, ? Partial seizures -FU ECHO -check LDL/hba1c, Pt/OT  Atrial fibrillation on Chronic anticoagulation - continue xarelto, beta blocker  Urinary incontinence and Gait instability - Currently holding oxybutynin as above - Possibly related mainly to lasix dosing, meds - PT evaluation - Check A1C - neuropathy could certainly cause these symptoms.  Glu 116 on initial BMET.     COPD with acute exacerbation with  Chronic respiratory failure with hypoxia - Continue home O2  -stable  CKD (baseline 1.0 - 1.2) - She is at baseline today - Holding lasix     Hyperlipidemia - Continue statin  Essential hypertension - stable - Holding IMDUR, oxybutynin - Continue metoprolol    GERD (gastroesophageal reflux disease) - Continue PPI    Depression - Continue venlafaxine, zolpidem, prn xanax  DVT prophylaxis: Xarelto Code Status: Full Family Communication: none at bedside Disposition Plan: home pending above workup   Consultants:   NEuro   Subjective: Feels well, wants to go home  Objective: Vitals:   01/09/17 0128 01/09/17 0131 01/09/17 0340 01/09/17 0543  BP: (!) 156/50 (!) 151/64 (!) 156/83 (!) 143/79  Pulse: 69 78 70 64  Resp: 18 18 16 16   Temp: 97.4 F (36.3 C) 97.4 F (36.3 C) 97.8 F (36.6 C) 98.5 F (36.9 C)  TempSrc: Oral Oral Oral Axillary  SpO2: 95% 98% 97% 95%  Weight:  82.5 kg (181 lb 12.8 oz)    Height:  5\' 3"  (1.6 m)      Intake/Output Summary (Last 24 hours) at 01/09/17 1105 Last data filed at 01/09/17 0300  Gross per 24 hour  Intake            49.17 ml  Output              400 ml  Net          -350.83 ml   Filed Weights   01/08/17 1643 01/09/17  0131  Weight: 83.5 kg (184 lb) 82.5 kg (181 lb 12.8 oz)    Examination:  General exam: Appears calm and comfortable, no distress, AAOX3 Respiratory system: Clear to auscultation. Respiratory effort normal. Cardiovascular system: S1 & S2 heard, RRR. No JVD, murmurs, rubs Gastrointestinal system: Abdomen is nondistended, soft and nontender. Normal  bowel sounds heard. Central nervous system: Alert and oriented. No focal neurological deficits. Extremities: Symmetric 5 x 5 power. Skin: No rashes, lesions or ulcers Psychiatry: Judgement and insight appear normal. Mood & affect appropriate.     Data Reviewed:   CBC:  Recent Labs Lab 01/08/17 1706  WBC 6.8  HGB 11.6*  HCT 39.3  MCV 77.5*  PLT 205     Basic Metabolic Panel:  Recent Labs Lab 01/08/17 1706 01/09/17 0839  NA 137 141  K 4.0 3.7  CL 104 104  CO2 26 28  GLUCOSE 116* 117*  BUN 19 15  CREATININE 1.18* 1.06*  CALCIUM 8.9 8.8*  MG  --  1.7   GFR: Estimated Creatinine Clearance: 38 mL/min (A) (by C-G formula based on SCr of 1.06 mg/dL (H)). Liver Function Tests:  Recent Labs Lab 01/08/17 1703  AST 21  ALT 11*  ALKPHOS 84  BILITOT 0.7  PROT 6.4*  ALBUMIN 3.4*   No results for input(s): LIPASE, AMYLASE in the last 168 hours. No results for input(s): AMMONIA in the last 168 hours. Coagulation Profile: No results for input(s): INR, PROTIME in the last 168 hours. Cardiac Enzymes:  Recent Labs Lab 01/08/17 1703 01/09/17 0356 01/09/17 0839  TROPONINI <0.03 <0.03 <0.03   BNP (last 3 results) No results for input(s): PROBNP in the last 8760 hours. HbA1C: No results for input(s): HGBA1C in the last 72 hours. CBG:  Recent Labs Lab 01/08/17 1654  GLUCAP 92   Lipid Profile:  Recent Labs  01/09/17 0356  CHOL 115  HDL 40*  LDLCALC 58  TRIG 86  CHOLHDL 2.9   Thyroid Function Tests: No results for input(s): TSH, T4TOTAL, FREET4, T3FREE, THYROIDAB in the last 72 hours. Anemia Panel:  Recent Labs  01/09/17 0356  FERRITIN 13  TIBC 441  IRON 24*   Urine analysis:    Component Value Date/Time   COLORURINE STRAW (A) 01/08/2017 1957   APPEARANCEUR CLEAR 01/08/2017 1957   LABSPEC 1.008 01/08/2017 1957   PHURINE 5.0 01/08/2017 1957   GLUCOSEU NEGATIVE 01/08/2017 1957   HGBUR SMALL (A) 01/08/2017 1957   BILIRUBINUR NEGATIVE 01/08/2017 1957   KETONESUR NEGATIVE 01/08/2017 1957   PROTEINUR NEGATIVE 01/08/2017 1957   UROBILINOGEN 1.0 10/20/2014 1614   NITRITE NEGATIVE 01/08/2017 1957   LEUKOCYTESUR NEGATIVE 01/08/2017 1957   Sepsis Labs: @LABRCNTIP (procalcitonin:4,lacticidven:4)  )No results found for this or any previous visit (from the past 240 hour(s)).       Radiology  Studies: Dg Chest 2 View  Result Date: 01/08/2017 CLINICAL DATA:  Acute onset of syncope. Vertigo. Initial encounter. EXAM: CHEST  2 VIEW COMPARISON:  Chest radiograph performed 11/25/2016 FINDINGS: The lungs are well-aerated. Vascular congestion is noted, with minimal bilateral atelectasis. There is no evidence of pleural effusion or pneumothorax. The heart is enlarged. A large hiatal hernia is noted, filled with fluid and air. No acute osseous abnormalities are seen. IMPRESSION: 1. Vascular congestion and cardiomegaly, with minimal bilateral atelectasis. 2. Large hiatal hernia, filled with fluid and air. Electronically Signed   By: Garald Balding M.D.   On: 01/08/2017 18:08   Ct Head Wo Contrast  Result Date: 01/08/2017 CLINICAL  DATA:  Syncope. EXAM: CT HEAD WITHOUT CONTRAST TECHNIQUE: Contiguous axial images were obtained from the base of the skull through the vertex without intravenous contrast. COMPARISON:  CT scan of November 24, 2016. FINDINGS: Brain: Mild diffuse cortical atrophy is noted. Minimal chronic ischemic white matter disease is noted. No mass effect or midline shift is noted. Ventricular size is within normal limits. There is no evidence of mass lesion, hemorrhage or acute infarction. Vascular: Atherosclerosis of carotid siphons is noted. Skull: Normal. Negative for fracture or focal lesion. Sinuses/Orbits: No acute finding. Other: None. IMPRESSION: Mild diffuse cortical atrophy. Minimal chronic ischemic white matter disease. No acute intracranial abnormality seen. Electronically Signed   By: Marijo Conception, M.D.   On: 01/08/2017 18:42   Mr Brain Wo Contrast  Result Date: 01/09/2017 CLINICAL DATA:  83 year old female with episode of dizziness slurred speech and syncope on the day of presentation. EXAM: MRI HEAD WITHOUT CONTRAST MRA HEAD WITHOUT CONTRAST TECHNIQUE: Multiplanar, multiecho pulse sequences of the brain and surrounding structures were obtained without intravenous contrast.  Angiographic images of the head were obtained using MRA technique without contrast. COMPARISON:  Head CT without contrast 01/08/2017. Brain MRI and intracranial MRA 09/06/2016 and earlier. FINDINGS: MRI HEAD FINDINGS Brain: No restricted diffusion or evidence of acute infarction. There is a small area in the medial right occipital pole of new increased trace diffusion signal (series 4, image 23), but this corresponds to facilitated diffusion on ADC (series 400, image 24) with T2 and FLAIR hyperintensity compatible with developing encephalomalacia (series 7, image 14). Superimposed chronic left inferior occipital pole encephalomalacia, unchanged since 2017. No restricted diffusion to suggest acute infarction. Elsewhere stable gray and white matter signal including small chronic lacunar infarcts of the right corona radiata, right deep gray matter nuclei and right cerebellum. Stable cerebral volume. No midline shift, mass effect, evidence of mass lesion, ventriculomegaly, extra-axial collection or acute intracranial hemorrhage. Cervicomedullary junction and pituitary are within normal limits. No chronic cerebral blood products. Vascular: Major intracranial vascular flow voids are stable since 2017. Skull and upper cervical spine: Stable.  Stable bone marrow signal. Sinuses/Orbits: Stable and negative. Other: Mastoid air cells remain clear. Visible internal auditory structures appear stable and grossly normal. Negative scalp soft tissues. MRA HEAD FINDINGS Stable antegrade flow in the posterior circulation with dominant distal left vertebral artery. No distal vertebral artery stenosis. PICA origins appear stable and patent. Vertebrobasilar junction and basilar artery are patent without stenosis. SCA and PCA origins are stable. Small left posterior communicating artery re - demonstrated, the right is diminutive or absent. Bilateral PCA branches are stable with mild irregularity. Antegrade flow in both ICA siphons is  stable and without stenosis. The left siphon appears mildly dominant as before. Ophthalmic and left posterior communicating artery origins are patent. Carotid termini are stable and within normal limits. The left ACA A1 segment is dominant, the right is diminutive. Anterior communicating artery and visible ACA branches are stable and normal aside from tortuosity. Right MCA M1 segment is stable and within normal limits. There is a stable moderate stenosis of the left MCA mid M1 segment just beyond the anterior temporal artery takeoff. Bilateral MCA bifurcations remain patent. Visible bilateral MCA branches are stable. IMPRESSION: 1. No acute infarct. Subacute appearing ischemia in the medial left occipital lobe, superimposed on a chronic inferior left occipital lobe infarct. No associated hemorrhage or mass effect. 2. Stable intracranial MRA since 2017. No posterior circulation stenosis or occlusion. Chronic moderate left MCA M1 segment stenosis.  3. Stable chronic small vessel disease in the cerebral white matter, right deep gray matter, and right cerebellum. Electronically Signed   By: Genevie Ann M.D.   On: 01/09/2017 08:55   Mr Jodene Nam Head/brain KN Cm  Result Date: 01/09/2017 CLINICAL DATA:  83 year old female with episode of dizziness slurred speech and syncope on the day of presentation. EXAM: MRI HEAD WITHOUT CONTRAST MRA HEAD WITHOUT CONTRAST TECHNIQUE: Multiplanar, multiecho pulse sequences of the brain and surrounding structures were obtained without intravenous contrast. Angiographic images of the head were obtained using MRA technique without contrast. COMPARISON:  Head CT without contrast 01/08/2017. Brain MRI and intracranial MRA 09/06/2016 and earlier. FINDINGS: MRI HEAD FINDINGS Brain: No restricted diffusion or evidence of acute infarction. There is a small area in the medial right occipital pole of new increased trace diffusion signal (series 4, image 23), but this corresponds to facilitated diffusion  on ADC (series 400, image 24) with T2 and FLAIR hyperintensity compatible with developing encephalomalacia (series 7, image 14). Superimposed chronic left inferior occipital pole encephalomalacia, unchanged since 2017. No restricted diffusion to suggest acute infarction. Elsewhere stable gray and white matter signal including small chronic lacunar infarcts of the right corona radiata, right deep gray matter nuclei and right cerebellum. Stable cerebral volume. No midline shift, mass effect, evidence of mass lesion, ventriculomegaly, extra-axial collection or acute intracranial hemorrhage. Cervicomedullary junction and pituitary are within normal limits. No chronic cerebral blood products. Vascular: Major intracranial vascular flow voids are stable since 2017. Skull and upper cervical spine: Stable.  Stable bone marrow signal. Sinuses/Orbits: Stable and negative. Other: Mastoid air cells remain clear. Visible internal auditory structures appear stable and grossly normal. Negative scalp soft tissues. MRA HEAD FINDINGS Stable antegrade flow in the posterior circulation with dominant distal left vertebral artery. No distal vertebral artery stenosis. PICA origins appear stable and patent. Vertebrobasilar junction and basilar artery are patent without stenosis. SCA and PCA origins are stable. Small left posterior communicating artery re - demonstrated, the right is diminutive or absent. Bilateral PCA branches are stable with mild irregularity. Antegrade flow in both ICA siphons is stable and without stenosis. The left siphon appears mildly dominant as before. Ophthalmic and left posterior communicating artery origins are patent. Carotid termini are stable and within normal limits. The left ACA A1 segment is dominant, the right is diminutive. Anterior communicating artery and visible ACA branches are stable and normal aside from tortuosity. Right MCA M1 segment is stable and within normal limits. There is a stable moderate  stenosis of the left MCA mid M1 segment just beyond the anterior temporal artery takeoff. Bilateral MCA bifurcations remain patent. Visible bilateral MCA branches are stable. IMPRESSION: 1. No acute infarct. Subacute appearing ischemia in the medial left occipital lobe, superimposed on a chronic inferior left occipital lobe infarct. No associated hemorrhage or mass effect. 2. Stable intracranial MRA since 2017. No posterior circulation stenosis or occlusion. Chronic moderate left MCA M1 segment stenosis. 3. Stable chronic small vessel disease in the cerebral white matter, right deep gray matter, and right cerebellum. Electronically Signed   By: Genevie Ann M.D.   On: 01/09/2017 08:55        Scheduled Meds: . multivitamin with minerals  1 tablet Oral Daily  . pantoprazole  40 mg Oral Daily  . potassium chloride SA  60 mEq Oral q morning - 10a  . pravastatin  40 mg Oral q1800  . Rivaroxaban  15 mg Oral q morning - 10a  . venlafaxine XR  150 mg Oral Daily  . zolpidem  5 mg Oral QHS   Continuous Infusions: . sodium chloride 50 mL/hr at 01/09/17 0201     LOS: 1 day    Time spent: 12min    Domenic Polite, MD [facility] Pager [phone]  If 7PM-7AM, please contact night-coverage [email or web address] Password [email or web address] 01/09/2017, 11:05 AM

## 2017-01-09 NOTE — Progress Notes (Signed)
Pt transported to MRI 

## 2017-01-09 NOTE — Care Management Note (Addendum)
Case Management Note  Patient Details  Name: RUQAYA STRAUSS MRN: [id number] Date of Birth: 09-18-1928  Subjective/Objective:                 Patient from home with TIA, lives 3-5 minutes from daughter. Daughter in room, states she has RW WC BSC cane. Would like to DC to home and use [facility] for HHPT. Denies need for additional disciplines and DME. WOuld like to see Erline Levine with [facility] if possible. Referral made to [facility].    Action/Plan:  Anticipate DC to home w [facility].  4/21 No reply from Dr Kennon Holter office to sign orders for her HH/ follow [facility] after DC. [facility] will not take due to PCP Dr Wilson Singer does not sign orders. Spoke with patient and daughter at bedside, they states patient will go to Dr Gwenlyn Found Wednesday. They state they would prefer to set it up through his office if they still want it by Wednesday so they could use [facility]. Daughter states she really is not that interested in home health, as patient did not do it last time except when they came.   Expected Discharge Date:                  Expected Discharge Plan:  [place]  In-House Referral:     Discharge planning Services  CM Consult  Post Acute Care Choice:    Choice offered to:     DME Arranged:    DME Agency:     HH Arranged:    [place] Agency:     Status of Service:  In process, will continue to follow  If discussed at Long Length of Stay Meetings, dates discussed:    Additional Comments:  Carles Collet, RN 01/09/2017, 2:46 PM

## 2017-01-09 NOTE — Progress Notes (Signed)
SLP Cancellation Note  Patient Details Name: Raven Gray MRN: [id number] DOB: March 19, 1928   Cancelled treatment:       Reason Eval/Treat Not Completed: Pts daughter present who confirms pts cognitive linguistic skills are at baseline. SLP screened, no needs identified, will sign off;   Arvil Chaco MA, [place] 01/09/2017, 5:07 PM

## 2017-01-10 ENCOUNTER — Observation Stay (HOSPITAL_BASED_OUTPATIENT_CLINIC_OR_DEPARTMENT_OTHER): Payer: Medicare Other

## 2017-01-10 ENCOUNTER — Observation Stay (HOSPITAL_COMMUNITY): Payer: Medicare Other

## 2017-01-10 DIAGNOSIS — R55 Syncope and collapse: Secondary | ICD-10-CM | POA: Diagnosis not present

## 2017-01-10 DIAGNOSIS — G459 Transient cerebral ischemic attack, unspecified: Secondary | ICD-10-CM | POA: Diagnosis not present

## 2017-01-10 DIAGNOSIS — J441 Chronic obstructive pulmonary disease with (acute) exacerbation: Secondary | ICD-10-CM | POA: Diagnosis not present

## 2017-01-10 DIAGNOSIS — I639 Cerebral infarction, unspecified: Secondary | ICD-10-CM | POA: Diagnosis not present

## 2017-01-10 LAB — HEMOGLOBIN A1C
HEMOGLOBIN A1C: 6.5 % — AB (ref 4.8–5.6)
Mean Plasma Glucose: 140 mg/dL

## 2017-01-10 LAB — ECHOCARDIOGRAM COMPLETE
HEIGHTINCHES: 63 in
Weight: 2908.8 oz

## 2017-01-10 MED ORDER — STROKE: EARLY STAGES OF RECOVERY BOOK
Freq: Once | Status: AC
  Administered 2017-01-10: 07:00:00
  Filled 2017-01-10: qty 1

## 2017-01-10 MED ORDER — FUROSEMIDE 40 MG PO TABS: 40.0000 mg | ORAL_TABLET | Freq: Every morning | ORAL | Status: DC

## 2017-01-10 MED ORDER — FUROSEMIDE 40 MG PO TABS
40.0000 mg | ORAL_TABLET | Freq: Every day | ORAL | Status: DC
  Administered 2017-01-10: 40 mg via ORAL
  Filled 2017-01-10: qty 1

## 2017-01-10 MED ORDER — LEVETIRACETAM 250 MG PO TABS: 250.0000 mg | ORAL_TABLET | Freq: Two times a day (BID) | ORAL | 0 refills | Status: DC

## 2017-01-10 MED ORDER — OFF THE BEAT BOOK
Freq: Once | Status: AC
Start: 2017-01-10 — End: 2017-01-10
  Administered 2017-01-10: 07:00:00
  Filled 2017-01-10: qty 1

## 2017-01-10 NOTE — Procedures (Signed)
Electroencephalogram (EEG) Report  Date of study: 01/10/17  Requesting clinician: Rosalin Hawking, MD  Reason for study: Evaluation of spells  Brief clinical history: This is an 69-yo woman with episodes of unresponsiveness concerning for possible seizure. EEG for further evaluation.  Medications:  Current Facility-Administered Medications:  .  acetaminophen (TYLENOL) tablet 650 mg, 650 mg, Oral, Q4H PRN **OR** acetaminophen (TYLENOL) solution 650 mg, 650 mg, Per Tube, Q4H PRN **OR** acetaminophen (TYLENOL) suppository 650 mg, 650 mg, Rectal, Q4H PRN, Sid Falcon, MD .  ALPRAZolam Duanne Moron) tablet 0.25 mg, 0.25 mg, Oral, BID PRN, Sid Falcon, MD .  furosemide (LASIX) tablet 40 mg, 40 mg, Oral, Daily, Domenic Polite, MD, 40 mg at 01/10/17 0939 .  levETIRAcetam (KEPPRA) tablet 250 mg, 250 mg, Oral, BID, Darrel Reach, MD, 250 mg at 01/10/17 0940 .  multivitamin with minerals tablet 1 tablet, 1 tablet, Oral, Daily, Sid Falcon, MD, 1 tablet at 01/10/17 [phone] .  nitroGLYCERIN (NITROSTAT) SL tablet 0.4 mg, 0.4 mg, Sublingual, PRN, Sid Falcon, MD .  pantoprazole (PROTONIX) EC tablet 40 mg, 40 mg, Oral, Daily, Sid Falcon, MD, 40 mg at 01/10/17 1950 .  potassium chloride SA (K-DUR,KLOR-CON) CR tablet 60 mEq, 60 mEq, Oral, q morning - 10a, Sid Falcon, MD, 60 mEq at 01/10/17 0939 .  pravastatin (PRAVACHOL) tablet 40 mg, 40 mg, Oral, q1800, Sid Falcon, MD, 40 mg at 01/09/17 2229 .  Rivaroxaban (XARELTO) tablet 15 mg, 15 mg, Oral, q morning - 10a, Sid Falcon, MD, 15 mg at 01/10/17 0939 .  senna-docusate (Senokot-S) tablet 1 tablet, 1 tablet, Oral, QHS PRN, Sid Falcon, MD .  venlafaxine XR (EFFEXOR-XR) 24 hr capsule 150 mg, 150 mg, Oral, Daily, Sid Falcon, MD, 150 mg at 01/10/17 0939 .  zolpidem (AMBIEN) tablet 5 mg, 5 mg, Oral, QHS, Sid Falcon, MD, 5 mg at 01/09/17 2228  Description: This is a routine EEG performed using standard international 10-20 electrode  placement. A total of 18 channels are recorded, including one for the EKG. Wakefulness, drowsiness, and sleep are recorded.   Activating Maneuvers: none  Findings:  The EKG channel demonstrates an irregularly irregular rhythm with a rate of 80 beats per minute.   The background consists of well-formed alpha activity. The best dominant posterior rhythm is 9. This is symmetric and reacts as expected with eye opening.   There are no focal asymmetries. No epileptiform discharges are present. No seizures are recorded.   Drowsiness is recorded and is normal in appearance. Early stages of sleep are recorded and demonstrate normal architecture. Transitions between sleep and wakefulness appear normal.    Impression:  This is a normal awake, drowsy, and sleep EEG.   Clinical correlation: This normal EEG does not preclude a diagnosis of seizure or epilepsy. Clinical correlation is recommended.    Melba Coon, MD [facility]

## 2017-01-10 NOTE — Discharge Summary (Signed)
Physician Discharge Summary  Raven Gray GEX:[id number] DOB: 11-Oct-1927 DOA: 01/08/2017  PCP: [name], MD  Admit date: 01/08/2017 Discharge date: 01/10/2017  Time spent: 45 minutes  Recommendations for Outpatient Follow-up:  1. PCP Dr.Kohut in 1 week, started on Low dose Keppra for suspected Complex partial seizures 2. Stopped Imdur and cut down Lasix dose due to syncope in the setting of orthostatic hypotension 3. Please ensure Neurology follow up 4. Home health PT/RN  Discharge Diagnoses:    Syncope-Orthostatic hypotension   Suspected Complex partial seizures- h/o Neurological spells with aura/post ictal state   Hypokalemia   COPD with acute exacerbation (HCC)   Atrial fibrillation (HCC)   Chest pain   Hyperlipidemia   TIA (transient ischemic attack)   Essential hypertension   GERD (gastroesophageal reflux disease)   Depression   Chronic anticoagulation   Chronic respiratory failure with hypoxia (HCC)   Loss of consciousness ([place])   Syncope and collapse   Discharge Condition: stable  Diet recommendation: low sodium heart healthy  Filed Weights   01/08/17 1643 01/09/17 0131  Weight: 83.5 kg (184 lb) 82.5 kg (181 lb 12.8 oz)    History of present illness:  Raven Gray a 83 y.o.femalewith medical history significant of TIA, HTN, HLD, PE/DVT, Afib on Xarelto, Depression, urinary incontinence, generalized weakness and gait difficulty, COPD in a never smoker who presents for an episode of LOC.  While patient was visiting at her son's house, she was sitting at the table eating crackers when she felt like "an attack" was coming on. She closed her eyes and could not reopen them. She started having slowed and slurred speech, shallow breathing and loss of consciousness. She was laid on the floor and required 3 rescue breaths by her SIL and then she started breathing normally. She started to come back around about 15 min later when EMS was getting her ready to transport  to this hospital. She has had 2 episodes like this in the past, first in December when she was diagnosed with a TIA  Hospital Course:  Syncope -suspect above episode likely related to Syncope due to Orthostatic hypotension -BP dropped from 156 to 126 on standing -hydrated with IVF, stopped Imdur, lasix dose changed from 60mg  to 40mg  daily -clinically improved and stable, MRI Brain notable for subacute CVA-suspect this is an incidental finding -see below  Subacute CVA -this is felt to be an incidental finding, she is on Xarelto, and reported compliance -known H/o AFib -continue xarelto and statin -Appreciate Neuro input, due to h/o intermittent neurological spells since December with aura and mild post ictal state there was a concern for complex partial seizures and hence she was started on low dose Keppra by Dr.Oster -she is stable on this for 24hours, EEG done this admission was unremarkable -ECHo not repeated since she had an ECHO done 52months ago in December which showed normal EF -LDL was 58 and Hba1c was 6.5 -Pt/OT evaluation completed, discharged home with Home health PT and RN  Atrial fibrillation on Chronic anticoagulation - continue xarelto, beta blocker  Urinary incontinence and Gait instability - held oxybutynin due to concern for anticholinergic side effects - Possibly related mainly to lasix dosing, meds - PT evaluation completed-Home health PT set up  COPD with acute exacerbation with Chronic respiratory failure with hypoxia - Continue home O2  -stable  CKD (baseline 1.0 - 1.2) - She is at baseline   Hyperlipidemia - Continue statin  Essential hypertension - stable - stopped IMDUR,  oxybutynin as above - Continue metoprolol  GERD (gastroesophageal reflux disease) - Continue PPI  Depression - Continue venlafaxine, zolpidem, prn xanax   Procedures:  EEG: unremarkable  Consultations:  Neurology Dr.Oster  Discharge Exam: Vitals:    01/09/17 2250 01/10/17 0620  BP: (!) 157/82 (!) 166/91  Pulse: 77 71  Resp: 18 18  Temp: 98.2 F (36.8 C) 98.5 F (36.9 C)    General: AAOx3 Cardiovascular: S1S2/RRR Respiratory: CTAB  Discharge Instructions   Discharge Instructions    Diet - low sodium heart healthy    Complete by:  As directed    Increase activity slowly    Complete by:  As directed      Current Discharge Medication List    START taking these medications   Details  levETIRAcetam (KEPPRA) 250 MG tablet Take 1 tablet (250 mg total) by mouth 2 (two) times daily. Qty: 60 tablet, Refills: 0      CONTINUE these medications which have CHANGED   Details  furosemide (LASIX) 40 MG tablet Take 1 tablet (40 mg total) by mouth every morning.      CONTINUE these medications which have NOT CHANGED   Details  acetaminophen (TYLENOL) 325 MG tablet Take 650 mg by mouth every 6 (six) hours as needed (for pain).     ALPRAZolam (XANAX) 0.25 MG tablet Take 1 tablet (0.25 mg total) by mouth 2 (two) times daily as needed for anxiety. Qty: 30 tablet, Refills: 0    metoprolol (LOPRESSOR) 25 MG tablet Take 1 tablet (25 mg total) by mouth 2 (two) times daily. Qty: 180 tablet, Refills: 3    Multiple Vitamins-Minerals (WOMENS 50+ MULTI VITAMIN/MIN) TABS Take 1 tablet by mouth daily.    nitroGLYCERIN (NITROSTAT) 0.4 MG SL tablet Place 0.4 mg under the tongue as needed for chest pain. X 3 doses Refills: 0    omeprazole (PRILOSEC) 20 MG capsule Take 20 mg by mouth daily as needed (for reflux/heartburn).  Refills: 11    potassium chloride SA (K-DUR,KLOR-CON) 20 MEQ tablet Take 60 mEq by mouth every morning.     pravastatin (PRAVACHOL) 40 MG tablet Take 40 mg by mouth every morning.     Rivaroxaban (XARELTO) 15 MG TABS tablet Take 1 tablet (15 mg total) by mouth every morning. Qty: 21 tablet, Refills: 0    venlafaxine XR (EFFEXOR-XR) 150 MG 24 hr capsule Take 150 mg by mouth daily.    zolpidem (AMBIEN) 5 MG tablet Take  5 mg by mouth at bedtime.    OXYGEN Inhale 2 L into the lungs continuous.      STOP taking these medications     isosorbide mononitrate (IMDUR) 60 MG 24 hr tablet      oxybutynin (DITROPAN-XL) 5 MG 24 hr tablet        No Known Allergies Follow-up Information    [name], MD. Schedule an appointment as soon as possible for a visit in 1 week(s).   Specialty:  Endocrinology Contact information: [address] [place] [address] [phone]            The results of significant diagnostics from this hospitalization (including imaging, microbiology, ancillary and laboratory) are listed below for reference.    Significant Diagnostic Studies: Dg Chest 2 View  Result Date: 01/08/2017 CLINICAL DATA:  Acute onset of syncope. Vertigo. Initial encounter. EXAM: CHEST  2 VIEW COMPARISON:  Chest radiograph performed 11/25/2016 FINDINGS: The lungs are well-aerated. Vascular congestion is noted, with minimal bilateral atelectasis. There is  no evidence of pleural effusion or pneumothorax. The heart is enlarged. A large hiatal hernia is noted, filled with fluid and air. No acute osseous abnormalities are seen. IMPRESSION: 1. Vascular congestion and cardiomegaly, with minimal bilateral atelectasis. 2. Large hiatal hernia, filled with fluid and air. Electronically Signed   By: Garald Balding M.D.   On: 01/08/2017 18:08   Ct Head Wo Contrast  Result Date: 01/08/2017 CLINICAL DATA:  Syncope. EXAM: CT HEAD WITHOUT CONTRAST TECHNIQUE: Contiguous axial images were obtained from the base of the skull through the vertex without intravenous contrast. COMPARISON:  CT scan of November 24, 2016. FINDINGS: Brain: Mild diffuse cortical atrophy is noted. Minimal chronic ischemic white matter disease is noted. No mass effect or midline shift is noted. Ventricular size is within normal limits. There is no evidence of mass lesion, hemorrhage or acute infarction. Vascular: Atherosclerosis of  carotid siphons is noted. Skull: Normal. Negative for fracture or focal lesion. Sinuses/Orbits: No acute finding. Other: None. IMPRESSION: Mild diffuse cortical atrophy. Minimal chronic ischemic white matter disease. No acute intracranial abnormality seen. Electronically Signed   By: Marijo Conception, M.D.   On: 01/08/2017 18:42   Mr Brain Wo Contrast  Result Date: 01/09/2017 CLINICAL DATA:  83 year old female with episode of dizziness slurred speech and syncope on the day of presentation. EXAM: MRI HEAD WITHOUT CONTRAST MRA HEAD WITHOUT CONTRAST TECHNIQUE: Multiplanar, multiecho pulse sequences of the brain and surrounding structures were obtained without intravenous contrast. Angiographic images of the head were obtained using MRA technique without contrast. COMPARISON:  Head CT without contrast 01/08/2017. Brain MRI and intracranial MRA 09/06/2016 and earlier. FINDINGS: MRI HEAD FINDINGS Brain: No restricted diffusion or evidence of acute infarction. There is a small area in the medial right occipital pole of new increased trace diffusion signal (series 4, image 23), but this corresponds to facilitated diffusion on ADC (series 400, image 24) with T2 and FLAIR hyperintensity compatible with developing encephalomalacia (series 7, image 14). Superimposed chronic left inferior occipital pole encephalomalacia, unchanged since 2017. No restricted diffusion to suggest acute infarction. Elsewhere stable gray and white matter signal including small chronic lacunar infarcts of the right corona radiata, right deep gray matter nuclei and right cerebellum. Stable cerebral volume. No midline shift, mass effect, evidence of mass lesion, ventriculomegaly, extra-axial collection or acute intracranial hemorrhage. Cervicomedullary junction and pituitary are within normal limits. No chronic cerebral blood products. Vascular: Major intracranial vascular flow voids are stable since 2017. Skull and upper cervical spine: Stable.   Stable bone marrow signal. Sinuses/Orbits: Stable and negative. Other: Mastoid air cells remain clear. Visible internal auditory structures appear stable and grossly normal. Negative scalp soft tissues. MRA HEAD FINDINGS Stable antegrade flow in the posterior circulation with dominant distal left vertebral artery. No distal vertebral artery stenosis. PICA origins appear stable and patent. Vertebrobasilar junction and basilar artery are patent without stenosis. SCA and PCA origins are stable. Small left posterior communicating artery re - demonstrated, the right is diminutive or absent. Bilateral PCA branches are stable with mild irregularity. Antegrade flow in both ICA siphons is stable and without stenosis. The left siphon appears mildly dominant as before. Ophthalmic and left posterior communicating artery origins are patent. Carotid termini are stable and within normal limits. The left ACA A1 segment is dominant, the right is diminutive. Anterior communicating artery and visible ACA branches are stable and normal aside from tortuosity. Right MCA M1 segment is stable and within normal limits. There is a stable moderate stenosis of  the left MCA mid M1 segment just beyond the anterior temporal artery takeoff. Bilateral MCA bifurcations remain patent. Visible bilateral MCA branches are stable. IMPRESSION: 1. No acute infarct. Subacute appearing ischemia in the medial left occipital lobe, superimposed on a chronic inferior left occipital lobe infarct. No associated hemorrhage or mass effect. 2. Stable intracranial MRA since 2017. No posterior circulation stenosis or occlusion. Chronic moderate left MCA M1 segment stenosis. 3. Stable chronic small vessel disease in the cerebral white matter, right deep gray matter, and right cerebellum. Electronically Signed   By: Genevie Ann M.D.   On: 01/09/2017 08:55   Mr Jodene Nam Head/brain YP Cm  Result Date: 01/09/2017 CLINICAL DATA:  83 year old female with episode of dizziness  slurred speech and syncope on the day of presentation. EXAM: MRI HEAD WITHOUT CONTRAST MRA HEAD WITHOUT CONTRAST TECHNIQUE: Multiplanar, multiecho pulse sequences of the brain and surrounding structures were obtained without intravenous contrast. Angiographic images of the head were obtained using MRA technique without contrast. COMPARISON:  Head CT without contrast 01/08/2017. Brain MRI and intracranial MRA 09/06/2016 and earlier. FINDINGS: MRI HEAD FINDINGS Brain: No restricted diffusion or evidence of acute infarction. There is a small area in the medial right occipital pole of new increased trace diffusion signal (series 4, image 23), but this corresponds to facilitated diffusion on ADC (series 400, image 24) with T2 and FLAIR hyperintensity compatible with developing encephalomalacia (series 7, image 14). Superimposed chronic left inferior occipital pole encephalomalacia, unchanged since 2017. No restricted diffusion to suggest acute infarction. Elsewhere stable gray and white matter signal including small chronic lacunar infarcts of the right corona radiata, right deep gray matter nuclei and right cerebellum. Stable cerebral volume. No midline shift, mass effect, evidence of mass lesion, ventriculomegaly, extra-axial collection or acute intracranial hemorrhage. Cervicomedullary junction and pituitary are within normal limits. No chronic cerebral blood products. Vascular: Major intracranial vascular flow voids are stable since 2017. Skull and upper cervical spine: Stable.  Stable bone marrow signal. Sinuses/Orbits: Stable and negative. Other: Mastoid air cells remain clear. Visible internal auditory structures appear stable and grossly normal. Negative scalp soft tissues. MRA HEAD FINDINGS Stable antegrade flow in the posterior circulation with dominant distal left vertebral artery. No distal vertebral artery stenosis. PICA origins appear stable and patent. Vertebrobasilar junction and basilar artery are patent  without stenosis. SCA and PCA origins are stable. Small left posterior communicating artery re - demonstrated, the right is diminutive or absent. Bilateral PCA branches are stable with mild irregularity. Antegrade flow in both ICA siphons is stable and without stenosis. The left siphon appears mildly dominant as before. Ophthalmic and left posterior communicating artery origins are patent. Carotid termini are stable and within normal limits. The left ACA A1 segment is dominant, the right is diminutive. Anterior communicating artery and visible ACA branches are stable and normal aside from tortuosity. Right MCA M1 segment is stable and within normal limits. There is a stable moderate stenosis of the left MCA mid M1 segment just beyond the anterior temporal artery takeoff. Bilateral MCA bifurcations remain patent. Visible bilateral MCA branches are stable. IMPRESSION: 1. No acute infarct. Subacute appearing ischemia in the medial left occipital lobe, superimposed on a chronic inferior left occipital lobe infarct. No associated hemorrhage or mass effect. 2. Stable intracranial MRA since 2017. No posterior circulation stenosis or occlusion. Chronic moderate left MCA M1 segment stenosis. 3. Stable chronic small vessel disease in the cerebral white matter, right deep gray matter, and right cerebellum. Electronically Signed  By: Genevie Ann M.D.   On: 01/09/2017 08:55    Microbiology: No results found for this or any previous visit (from the past 240 hour(s)).   Labs: Basic Metabolic Panel:  Recent Labs Lab 01/08/17 1706 01/09/17 0839  NA 137 141  K 4.0 3.7  CL 104 104  CO2 26 28  GLUCOSE 116* 117*  BUN 19 15  CREATININE 1.18* 1.06*  CALCIUM 8.9 8.8*  MG  --  1.7   Liver Function Tests:  Recent Labs Lab 01/08/17 1703  AST 21  ALT 11*  ALKPHOS 84  BILITOT 0.7  PROT 6.4*  ALBUMIN 3.4*   No results for input(s): LIPASE, AMYLASE in the last 168 hours. No results for input(s): AMMONIA in the  last 168 hours. CBC:  Recent Labs Lab 01/08/17 1706  WBC 6.8  HGB 11.6*  HCT 39.3  MCV 77.5*  PLT 205   Cardiac Enzymes:  Recent Labs Lab 01/08/17 1703 01/09/17 0356 01/09/17 0839 01/09/17 1414  TROPONINI <0.03 <0.03 <0.03 <0.03   BNP: BNP (last 3 results)  Recent Labs  03/31/16 1130 10/23/16 1222  BNP 255.8* 306.7*    ProBNP (last 3 results) No results for input(s): PROBNP in the last 8760 hours.  CBG:  Recent Labs Lab 01/08/17 1654  GLUCAP 92       Signed:  Satin Boal MD.  [facility] 01/10/2017, 11:43 AM

## 2017-01-10 NOTE — Progress Notes (Signed)
EEG Completed; Results Pending  

## 2017-01-10 NOTE — Evaluation (Signed)
Occupational Therapy Evaluation  Patient Details Name: Raven Gray MRN: [id number] DOB: [birth year] Today's Date: 01/10/2017    History of Present Illness  83 y.o. female who presented to the ED with sudden LOC, received some rescue breaths and then started breathing normally.  Had similar episodes March and previous weakness with walking coming on.  PMH: afib on xarelto,  DVT/PE, HTN, TIA, CVA, COPD   Clinical Impression   Pt reports she was mod I with ADL PTA. Currently pt overall min guard assist for stand pivot transfer and ADL. Pt presenting with generalized weakness, mild dizziness with positional changes, poor standing balance impacting her independence and safety with ADL and functional mobility. Pt planning to d/c home alone with intermittent family supervision. Recommending HHOT for follow up to maximize independence and safety with ADL and functional mobility upon return home. Pt would benefit from continued skilled OT to address established goals.    Follow Up Recommendations  Home health OT;Supervision/Assistance - 24 hour    Equipment Recommendations  None recommended by OT    Recommendations for Other Services       Precautions / Restrictions Precautions Precautions: Fall Restrictions Weight Bearing Restrictions: No      Mobility Bed Mobility Overal bed mobility: Needs Assistance Bed Mobility: Supine to Sit     Supine to sit: Min guard     General bed mobility comments: Min guard for safety, increased time required.  Transfers Overall transfer level: Needs assistance Equipment used: 1 person hand held assist Transfers: Sit to/from [facility] Sit to Stand: Min guard Stand pivot transfers: Min guard       General transfer comment: Multiple attempts to boost up from EOB. Mildly unsteady in standing    Balance Overall balance assessment: Needs assistance Sitting-balance support: Feet supported;No upper extremity supported Sitting  balance-Leahy Scale: Good     Standing balance support: Single extremity supported Standing balance-Leahy Scale: Fair                             ADL either performed or assessed with clinical judgement   ADL Overall ADL's : Needs assistance/impaired Eating/Feeding: Set up;Sitting   Grooming: Set up;Sitting   Upper Body Bathing: Set up;Supervision/ safety;Sitting   Lower Body Bathing: Min guard;Sit to/from stand   Upper Body Dressing : Set up;Supervision/safety;Sitting   Lower Body Dressing: Min guard;Sit to/from stand Lower Body Dressing Details (indicate cue type and reason): Able to doff socks and don slippers Toilet Transfer: Min Designer, jewellery Details (indicate cue type and reason): Simulated bed > chair         Functional mobility during ADLs: Min guard (for stand pivot) General ADL Comments: Pt reports she has life alert at home and family near by that come to assist as needed. Family declining HH follow up.     Vision         Perception     Praxis      Pertinent Vitals/Pain Pain Assessment: No/denies pain     Hand Dominance Right   Extremity/Trunk Assessment Upper Extremity Assessment Upper Extremity Assessment: Overall WFL for tasks assessed   Lower Extremity Assessment Lower Extremity Assessment: Defer to PT evaluation       Communication Communication Communication: No difficulties   Cognition Arousal/Alertness: Awake/alert Behavior During Therapy: WFL for tasks assessed/performed Overall Cognitive Status: Within Functional Limits for tasks assessed  General Comments       Exercises     Shoulder Instructions      Home Living Family/patient expects to be discharged to:: Private residence Living Arrangements: Alone Available Help at Discharge: Family;Available 24 hours/day Type of Home: House Home Access: Ramped entrance     Home Layout: One level      Bathroom Shower/Tub: Teacher, early years/pre: Handicapped height Bathroom Accessibility: Yes   Home Equipment: Environmental consultant - 2 wheels;Wheelchair - manual;Cane - single point;Bedside commode (life alert)          Prior Functioning/Environment Level of Independence: Independent with assistive device(s)        Comments: SPC and RW or rollator to walk, only sponge bathes, family drives pt to appointments        OT Problem List: Decreased strength;Impaired balance (sitting and/or standing)      OT Treatment/Interventions: Self-care/ADL training;Therapeutic exercise;Energy conservation;DME and/or AE instruction;Therapeutic activities;Balance training;Patient/family education    OT Goals(Current goals can be found in the care plan section) Acute Rehab OT Goals Patient Stated Goal: to go home OT Goal Formulation: With patient Time For Goal Achievement: 01/24/17 Potential to Achieve Goals: Good ADL Goals Pt Will Perform Grooming: with modified independence;standing Pt Will Perform Upper Body Bathing: with modified independence;sitting Pt Will Perform Lower Body Bathing: with modified independence;sit to/from stand Pt Will Perform Upper Body Dressing: with modified independence;sitting Pt Will Perform Lower Body Dressing: with modified independence;sit to/from stand Pt Will Transfer to Toilet: with modified independence;ambulating;bedside commode Pt Will Perform Toileting - Clothing Manipulation and hygiene: with modified independence;sit to/from stand  OT Frequency: Min 2X/week   Barriers to D/C: Decreased caregiver support  pt lives alone       Co-evaluation              End of Session    Activity Tolerance: Patient tolerated treatment well Patient left: in chair;with call bell/phone within reach;with family/visitor present  OT Visit Diagnosis: Unsteadiness on feet (R26.81);Muscle weakness (generalized) (M62.81)                Time: 8921-1941 OT Time  Calculation (min): 15 min Charges:  OT General Charges $OT Visit: 1 Procedure OT Evaluation $OT Eval Moderate Complexity: 1 Procedure G-Codes: OT G-codes **NOT FOR INPATIENT CLASS** Functional Assessment Tool Used: Clinical judgement Functional Limitation: Self care Self Care Current Status (D4081): At least 20 percent but less than 40 percent impaired, limited or restricted Self Care Goal Status (K4818): At least 1 percent but less than 20 percent impaired, limited or restricted   Mel Almond A. Ulice Brilliant, M.S., OTR/L Pager: [phone] 01/10/2017, 2:23 PM

## 2017-01-10 NOTE — Progress Notes (Addendum)
STROKE TEAM PROGRESS NOTE   HISTORY OF PRESENT ILLNESS (per record) Raven Gray is an 83 y.o. female with a history of a TIA, COPD, PE/DVT, hyperlipidemia, Afib on Xarelto, depression, generalized weakness and gait difficulties admitted to [facility] on 01/08/2017 for an episode of loss of consciousness which lasted approximately 15 minutes. The patient has had two similar episodes in the past. Neurology was previously, consulted on 08/27/2016 for evaluation of dysarthria, blurred vision, and transient unresponsiveness. At that time it was felt that the patient had experienced a TIA secondary to atrial fibrillation despite being on Xarelto therapy. An EEG was performed on 10/20/2016 by Dr [name] at the [facility] office which was interpreted as normal. Dr. [name] had seen the patient in follow-up in the office on 11/18/2016 and recommended continuing the Xarelto. Apparently the patient had not been taking the medication consistently. Dr. [name] later saw the patient in consult on 11/24/2016 for encephalopathy felt secondary to dehydration with a possible TIA. An EEG was repeated on 11/25/2016 and once again this was interpreted as normal.  She has been followed in the past by Dr. Quay Burow for her cardiac issues. The patient's daughter reports that the patient is now compliant with her Xarelto. The daughter gives her the medication faithfully with food. Neurology has been consulted for loss of consciousness.  I had a long talk with the patient's daughter. She gave most of the history. The daughter describes two separate types of "episodes" experienced by the patient.  The first episode has occurred on three different occasions including yesterday. This usually occurs at rest. The patient begins to slur her speech. She closes her eyes. She then goes into a "deep sleep". With yesterday's episode her breathing became very shallow. Her son-in-law placed her on the floor and performed CPR for several  minutes. The patient came back around. The whole thing lasted approximately 15 minutes. She has had two other similar episodes without CPR.  The second type of episode described occurs often while walking. The patient becomes anxious, short of breath, and develops urinary urgency. She does not lose consciousness with these episodes. She often has problems with her balance. The patient describes a "quivering" feeling in her abdomen and chest with these episodes. These episodes often occur daily or several times a day.  The patient lives alone Her daughter lives close by and checks on her frequently.   Date last known well: Date: 01/08/2017 Time last known well: Time: 15:00 tPA Given:  No - on Xarelto - late presentation - no acute stroke   SUBJECTIVE (INTERVAL HISTORY) Her daughters, granddaughter and great granddaughter are at the bedside. Patient daughter stated that yesterday patient was sitting in chair eating, started to have difficulty open eyes, slurry in words and then passed out. No blood pressure was checked at that time. EMS called, family put patient in supine position. On EMS arrival, BP was good. During transportation, patient regained consciousness. Episode lasting about 15 minutes. This is the third episode since Christmas last year. First episode in Christmas posterior similar to this current event but shorter lasting about 5 minutes. Second episode milder, without loss of consciousness, BP was okay at the time. All 3 times happened around lunchtime, patient usually takes BP meds at 11:00. Also times patient was sitting in chair. Patient can not remember whether she had chest pain or palpitation prior to LOC. No shaking jerking facial twitching or staring off. Daughter stated that patient compliant with medication.  OBJECTIVE Temp:  [97.4 F (36.3 C)-98.5 F (36.9 C)] 98.5 F (36.9 C) (04/21 0620) Pulse Rate:  [71-97] 71 (04/21 0620) Cardiac Rhythm: Atrial fibrillation (04/21  0700) Resp:  [18] 18 (04/21 0620) BP: (157-166)/(81-91) 166/91 (04/21 0620) SpO2:  [95 %-100 %] 95 % (04/21 0620)  CBC:   Recent Labs Lab 01/08/17 1706  WBC 6.8  HGB 11.6*  HCT 39.3  MCV 77.5*  PLT 706    Basic Metabolic Panel:   Recent Labs Lab 01/08/17 1706 01/09/17 0839  NA 137 141  K 4.0 3.7  CL 104 104  CO2 26 28  GLUCOSE 116* 117*  BUN 19 15  CREATININE 1.18* 1.06*  CALCIUM 8.9 8.8*  MG  --  1.7    Lipid Panel:     Component Value Date/Time   CHOL 115 01/09/2017 0356   TRIG 86 01/09/2017 0356   HDL 40 (L) 01/09/2017 0356   CHOLHDL 2.9 01/09/2017 0356   VLDL 17 01/09/2017 0356   LDLCALC 58 01/09/2017 0356   HgbA1c:  Lab Results  Component Value Date   HGBA1C 6.5 (H) 01/09/2017   Urine Drug Screen:     Component Value Date/Time   LABOPIA NONE DETECTED 11/24/2016 1720   COCAINSCRNUR NONE DETECTED 11/24/2016 1720   LABBENZ NONE DETECTED 11/24/2016 1720   AMPHETMU NONE DETECTED 11/24/2016 1720   THCU NONE DETECTED 11/24/2016 1720   LABBARB NONE DETECTED 11/24/2016 1720    Alcohol Level No results found for: [place] I have personally reviewed the radiological images below and agree with the radiology interpretations.  Dg Chest 2 View 01/08/2017 1. Vascular congestion and cardiomegaly, with minimal bilateral atelectasis.  2. Large hiatal hernia, filled with fluid and air.   Ct Head Wo Contrast 01/08/2017 Mild diffuse cortical atrophy. Minimal chronic ischemic white matter disease. No acute intracranial abnormality seen.   Mr Jodene Nam Head/brain Wo Cm 01/09/2017 1. No acute infarct. Subacute appearing ischemia in the medial left occipital lobe, superimposed on a chronic inferior left occipital lobe infarct. No associated hemorrhage or mass effect.  2. Stable intracranial MRA since 2017. No posterior circulation stenosis or occlusion. Chronic moderate left MCA M1 segment stenosis.  3. Stable chronic small vessel disease in the cerebral white  matter, right deep gray matter, and right cerebellum.   EEG - This is a normal awake, drowsy, and sleep EEG.  Clinical correlation: This normal EEG does not preclude a diagnosis of seizure or epilepsy. Clinical correlation is recommended.    PHYSICAL EXAM  Temp:  [97.4 F (36.3 C)-98.5 F (36.9 C)] 98.3 F (36.8 C) (04/21 1322) Pulse Rate:  [71-97] 88 (04/21 1322) Resp:  [18] 18 (04/21 1322) BP: (157-170)/(80-95) 165/80 (04/21 1400) SpO2:  [95 %-100 %] 99 % (04/21 1322)  General - Well nourished, well developed, in no apparent distress.  Ophthalmologic - Fundi not visualized due to noncooperation.  Cardiovascular - Regular rate and rhythm.  Mental Status -  Level of arousal and orientation to time, place, and person were intact. Language including expression, naming, repetition, comprehension was assessed and found intact.  Cranial Nerves II - XII - II - Visual field intact OU. III, IV, VI - Extraocular movements intact. V - Facial sensation intact bilaterally. VII - Facial movement intact bilaterally. VIII - Hearing & vestibular intact bilaterally. X - Palate elevates symmetrically. XI - Chin turning & shoulder shrug intact bilaterally. XII - Tongue protrusion intact.  Motor Strength - The patient's strength was normal in all extremities  except right upper extremity limited shoulder movement due to history of a rotator cuff surgery and pronator drift was absent.  Bulk was normal and fasciculations were absent.   Motor Tone - Muscle tone was assessed at the neck and appendages and was normal.  Reflexes - The patient's reflexes were 1+ in all extremities and she had no pathological reflexes.  Sensory - Light touch, temperature/pinprick were assessed and were symmetrical.    Coordination - The patient had normal movements in the hands with no ataxia or dysmetria.  Tremor was absent.  Gait and Station -  deferred.  ASSESSMENT/PLAN Ms. SHEYENNE KONZ is a 83 y.o. female with  history of syncope, TIAs, previous stroke, COPD, DVT with pulmonary embolus, hyperlipidemia, atrial fibrillation on Xarelto, depression, generalized weakness, and gait difficulties presenting with loss of consciousness. She did not receive IV t-PA due to anticoagulation.  Syncope - orthostatic vs. cardiogenic    By description, events were consistent with syncope  Could be due to orthostatic - orthostatic vital showed SBP drop > 40 on standing  Could be due to cardiac arrhythmia as patient had A. Fib  EEG normal  Less likely to be seizure as no typical seizure-like activities   However, agree to have a Keppra trial as instructed by Dr. [name]  Continue Keppra 250 twice a day  Follow-up with Dr. [name] at [facility]  Stroke, incidental - subacute small left occipital lobe  MRI - no acute infarct. Subacute ischemia left occipital lobe superimposed on a chronic inferior left occipital lobe infarct.  MRA - stable left M1 and left P1 high-grade stenosis  Carotid Doppler - 09/07/2016 - Bilateral ICA plaque 1-39%  Vertebral flow antegrade.  2D Echo - 09/07/2016 EF 60-65%  EEG normal   LDL - 58  HgbA1c - 6.5  VTE prophylaxis - Xarelto Diet Heart Room service appropriate? Yes; Fluid consistency: Thin Diet - low sodium heart healthy  Xarelto (rivaroxaban) daily prior to admission, now on Xarelto (rivaroxaban) daily. Continues on discharge  Patient counseled to be compliant with her antithrombotic medications  Ongoing aggressive stroke risk factor management  Therapy recommendations: HH PT recommended. OT evaluation pending.  Disposition:  Pending  Afib on Xarelto  Compliant with medication  Continue Xarelto  Had follow-up schedule with cardiology next week  Hypertension  Blood pressure mildly high at times  Permissive hypertension (OK if <180/105) but gradually normalize in 5-7 days  Long-term BP goal normotensive  Hyperlipidemia  Home meds: Pravachol 40 mg daily  resumed in hospital  LDL 58, goal < 70  Continue statin at discharge  Other Stroke Risk Factors  Advanced age  Obesity, Body mass index is 32.2 kg/m., recommend weight loss, diet and exercise as appropriate   Hx stroke/TIA  Other Active Problems  DVT / PE history   Elevated creatinine - 1.18 -> 1.06  Hyperglycemia - hemoglobin A1c 6.5  Has been following with Dr. [name] at [facility] day # 1  Neurology will sign off. Please call with questions. Pt will follow up with Dr. [name] at [facility] in about 6 weeks. Thanks for the consult.  Rosalin Hawking, MD PhD Stroke Neurology 01/10/2017 2:47 PM   To contact Stroke Continuity provider, please refer to [email or web address] After hours, contact General Neurology

## 2017-01-10 NOTE — Progress Notes (Addendum)
Patient was discharged home by MD order; discharged instructions review and give to patient with care notes and prescription for Keppra; IV DIC; patient will be escorted to the car by nurse via wheelchair.

## 2017-01-11 NOTE — Telephone Encounter (Signed)
Fine with me

## 2017-01-12 NOTE — Progress Notes (Signed)
Late entry for missed G-code. Based on review of the evaluation and goals by Mee Hives, PT.   01-27-2017 0805  PT G-Codes **NOT FOR INPATIENT CLASS**  Functional Assessment Tool Used AM-PAC 6 Clicks Basic Mobility  Functional Limitation Mobility: Walking and moving around  Mobility: Walking and Moving Around Current Status (Q1901) CK  Mobility: Walking and Moving Around Goal Status [phone]) CI    Lavonia Dana, [place]  [phone] 01/12/2017

## 2017-01-13 ENCOUNTER — Ambulatory Visit (INDEPENDENT_AMBULATORY_CARE_PROVIDER_SITE_OTHER): Payer: Medicare Other | Admitting: Cardiology

## 2017-01-13 ENCOUNTER — Other Ambulatory Visit: Payer: Self-pay | Admitting: *Deleted

## 2017-01-13 ENCOUNTER — Encounter: Payer: Self-pay | Admitting: Cardiology

## 2017-01-13 VITALS — BP 102/63 | HR 73 | Ht 63.0 in | Wt 180.0 lb

## 2017-01-13 DIAGNOSIS — I5032 Chronic diastolic (congestive) heart failure: Secondary | ICD-10-CM

## 2017-01-13 DIAGNOSIS — R55 Syncope and collapse: Secondary | ICD-10-CM | POA: Diagnosis not present

## 2017-01-13 DIAGNOSIS — R0902 Hypoxemia: Secondary | ICD-10-CM

## 2017-01-13 DIAGNOSIS — I482 Chronic atrial fibrillation, unspecified: Secondary | ICD-10-CM

## 2017-01-13 DIAGNOSIS — Z7901 Long term (current) use of anticoagulants: Secondary | ICD-10-CM | POA: Diagnosis not present

## 2017-01-13 NOTE — Progress Notes (Signed)
Cardiology Office Note   Date:  01/13/2017   ID:  Raven Gray, DOB 10/02/1927, MRN [id number]  PCP:  [name], MD  Cardiologist:  Dr. Gwenlyn Found    Chief Complaint  Patient presents with  . Hospitalization Follow-up      History of Present Illness: Raven Gray is a 83 y.o. female who presents for post hospitalization for syncope - with several different explanations.: complex partial seizures , orthostatic hypotension, maybe her heart, maybe TIA.    She had first syncope in Dec. Thought to be TIA - embolism from a fib due to not always taking xarelto with food and at different times.  She has had 2 EEGs both without seizure.  Then again in Feb and now in April.  On MRI this admit appeared to be subacute stroke.  Pt is sitting with episodes and just fades out -unresponsive and heavy respirations.  This last time her son-in law gave mouth to mouth for 3 breaths.  With perhaps partial seizure Keppra was suggested but she has not started.    Additonally she has been hypoxic and has been seen by Dr. Melvyn Novas and home sleep SP02 measured and she needs 02 with sleep.  I also recommended during naps.  With activity in out office she did de sat but she does not feel the oxygen helps her - Dr. Melvyn Novas left it to if it helps wear it.    She was orthostatic in ER  With BP lying 166/91 to sitting 141/59 to standing 125/81, standing 3 min was 151/84.  Her imdur was stopped.  Today she is more lethargic, answers questions but appears tired. No chest pain.  Her SOB is stable and some lower ext edema but not severe.  A fib continues. Rate controlled and SP02 is 95%.        ECHO: 01/10/17 Moderate LVH with LVEF approximately 55%. Indeterminate diastolic   function. Aortic annular calcification. Mildly ectatic ascending   aorta. Mild aortic regurgitation. Mild left atrial enlargement.   Mild mitral and tricuspid regurgitation. PASP 27 mmHg.  past medical history of chronic atrial fibrillation (on  Xarelto), prior PE/DVT, HTN, HLD, and COPD    Past Medical History:  Diagnosis Date  . Atrial fibrillation ([place])    a. on Xarelto  . Chest pain   . COPD (chronic obstructive pulmonary disease) ([place])   . H/O echocardiogram    a. 08/2016: EF of 60-65%, severely dilated LA, mild pulmonic regurgitations, mild TR, and PA Pressure at 38 mm Hg  . History of depression   . History of DVT (deep vein thrombosis)   . History of pulmonary embolism   . Hyperlipidemia   . Hypertension   . Hypokalemia    Now improved with treatment  . Shortness of breath   . Stroke [facility])     Past Surgical History:  Procedure Laterality Date  . REPLACEMENT TOTAL KNEE    . ROTATOR CUFF REPAIR       Current Outpatient Prescriptions  Medication Sig Dispense Refill  . acetaminophen (TYLENOL) 325 MG tablet Take 650 mg by mouth every 6 (six) hours as needed (for pain).     Marland Kitchen ALPRAZolam (XANAX) 0.25 MG tablet Take 1 tablet (0.25 mg total) by mouth 2 (two) times daily as needed for anxiety. 30 tablet 0  . furosemide (LASIX) 40 MG tablet Take 1 tablet (40 mg total) by mouth every morning.    . levETIRAcetam (KEPPRA) 250 MG tablet Take 1 tablet (  250 mg total) by mouth 2 (two) times daily. 60 tablet 0  . metoprolol (LOPRESSOR) 25 MG tablet Take 1 tablet (25 mg total) by mouth 2 (two) times daily. 180 tablet 3  . Multiple Vitamins-Minerals (WOMENS 50+ MULTI VITAMIN/MIN) TABS Take 1 tablet by mouth daily.    . nitroGLYCERIN (NITROSTAT) 0.4 MG SL tablet Place 0.4 mg under the tongue as needed for chest pain. X 3 doses  0  . omeprazole (PRILOSEC) 20 MG capsule Take 20 mg by mouth daily as needed (for reflux/heartburn).   11  . OXYGEN Inhale 2 L into the lungs continuous.    . potassium chloride SA (K-DUR,KLOR-CON) 20 MEQ tablet Take 60 mEq by mouth every morning.     . pravastatin (PRAVACHOL) 40 MG tablet Take 40 mg by mouth every morning.     . Rivaroxaban (XARELTO) 15 MG TABS tablet Take 1 tablet (15 mg total) by mouth  every morning. 21 tablet 0  . venlafaxine XR (EFFEXOR-XR) 150 MG 24 hr capsule Take 150 mg by mouth daily.    Marland Kitchen zolpidem (AMBIEN) 5 MG tablet Take 5 mg by mouth at bedtime.     No current facility-administered medications for this visit.     Allergies:   Patient has no known allergies.    Social History:  The patient  reports that she has never smoked. She has never used smokeless tobacco. She reports that she does not drink alcohol or use drugs.   Family History:  The patient's family history includes Alzheimer's disease in her mother; Diabetes type II in her daughter; Heart attack in her father; Heart disease in her daughter.    ROS:  General:no colds or fevers, no weight changes Skin:no rashes or ulcers HEENT:no blurred vision, no congestion CV:see HPI PUL:see HPI GI:no diarrhea constipation or melena, no indigestion GU:no hematuria, no dysuria MS:no joint pain, no claudication Neuro:+ syncope X 3, no lightheadedness Endo:no diabetes, no thyroid disease  Wt Readings from Last 3 Encounters:  01/13/17 180 lb (81.6 kg)  01/09/17 181 lb 12.8 oz (82.5 kg)  11/24/16 184 lb 3.2 oz (83.6 kg)     PHYSICAL EXAM: VS:  BP 102/63   Pulse 73   Ht 5\' 3"  (1.6 m)   Wt 180 lb (81.6 kg)   BMI 31.89 kg/m  , BMI Body mass index is 31.89 kg/m. General:Pleasant affect, NAD Skin:Warm and dry, brisk capillary refill HEENT:normocephalic, sclera clear, mucus membranes moist Neck:supple, no JVD, no bruits  Heart:irreg irreg without murmur, gallup, rub or click Lungs:clear without rales, rhonchi, or wheezes VQM:GQQP, non tender, + BS, do not palpate liver spleen or masses Ext:1+ lower ext edema at feet and ankles,  2+ radial pulses Neuro:alert and oriented X 3, MAE, follows commands, + facial symmetry    EKG:  EKG is NOT ordered today.  TELE form hospitalizations reviewed and a fib rate controlled occ slightly elevated and slower at other times. But no rhythm to cause syncope.   Recent  Labs: 10/23/2016: Brain Natriuretic Peptide 306.7 01/08/2017: ALT 11; Hemoglobin 11.6; Platelets 205 01/09/2017: BUN 15; Creatinine, Ser 1.06; Magnesium 1.7; Potassium 3.7; Sodium 141    Lipid Panel    Component Value Date/Time   CHOL 115 01/09/2017 0356   TRIG 86 01/09/2017 0356   HDL 40 (L) 01/09/2017 0356   CHOLHDL 2.9 01/09/2017 0356   VLDL 17 01/09/2017 0356   LDLCALC 58 01/09/2017 0356       Other studies Reviewed: Additional studies/ records that  were reviewed today include:echo see above, tele see above.   ASSESSMENT AND PLAN:  1.  Syncope X 3 episode, may all be neuro - TIA with one episode , + orthostasis with another and possible seizure.  I discussed with Dr. Lovena Le DOD and he would recommend 30 day event monitor to see if arrhythmia is cause.   If nothing is revealed, then possible LINQ.  He would be glad to discuss with her.  She will follow up with Dr. Gwenlyn Found  2. TIA and subacute CVA on xarelto, she was not taking correctly with TIA but is now - discussed with Dr. Lovena Le changing to another NOAC but he did not believe necessary.  Follow up with Neuro and discuss Keppra with him.   3. CAD  Normal cors in 2008 at cath, nuc in 2017 with no ischemia.  Done for chest pain, imdur was added.  Off imdur now for orthostatic hypotension and no angina.  4.  Diastolic HF- chronic - stable currently on lasix 40 mg daily.  Would not change currently  Daughter will call if SOB.  5. Hypoxia with need for oxygen with sleep.  Continue.  6. HTN , now low, continue current meds.  Current medicines are reviewed with the patient today.  The patient Has no concerns regarding medicines.  The following changes have been made:  See above Labs/ tests ordered today include:see above  Disposition:   FU:  see above  Signed, Cecilie Kicks, NP  01/13/2017 10:31 AM    [address] [place],  [place] Phone: [phone]; Fax: [phone]

## 2017-01-13 NOTE — Telephone Encounter (Addendum)
Samples given of xarelto 15 mg 1 bottle & given PA paperwork, will need to receive samples from [facility] otherwise we can not maintain her as she gets samples monthly.

## 2017-01-13 NOTE — Patient Instructions (Addendum)
Schedule 30 day event monitor  Take Klor Con 20 meq 2 tablets every morning   Your physician recommends that you schedule a follow-up appointment with Cecilie Kicks NP after monitor

## 2017-01-20 ENCOUNTER — Ambulatory Visit (INDEPENDENT_AMBULATORY_CARE_PROVIDER_SITE_OTHER): Payer: Medicare Other

## 2017-01-20 ENCOUNTER — Telehealth: Payer: Self-pay | Admitting: *Deleted

## 2017-01-20 DIAGNOSIS — R55 Syncope and collapse: Secondary | ICD-10-CM

## 2017-01-20 NOTE — Telephone Encounter (Signed)
Received a call from [employer] at [facility]. He stated the patient was in afib with a heart rate of 60. He stated that he would fax over the reading but no fax was received. Tried to contact the patient on two separate numbers but the voicemails were full and the call was disconnected. The patient does have chronic afib. Will route to the ordering provider for their information.

## 2017-01-21 ENCOUNTER — Telehealth: Payer: Self-pay | Admitting: Cardiovascular Disease

## 2017-01-21 MED ORDER — RIVAROXABAN 15 MG PO TABS: 15.0000 mg | ORAL_TABLET | Freq: Every morning | ORAL | 0 refills | Status: DC

## 2017-01-21 NOTE — Telephone Encounter (Signed)
Left message for patient, samples at the front desk for pick up. 

## 2017-01-21 NOTE — Telephone Encounter (Signed)
New message    Patient calling the office for samples of medication:   1.  What medication and dosage are you requesting samples for? Xarelto 15 mg  2.  Are you currently out of this medication? No, pt has enough for this week.

## 2017-01-22 ENCOUNTER — Telehealth: Payer: Self-pay | Admitting: Cardiovascular Disease

## 2017-01-22 DIAGNOSIS — J449 Chronic obstructive pulmonary disease, unspecified: Secondary | ICD-10-CM | POA: Diagnosis not present

## 2017-01-22 NOTE — Telephone Encounter (Signed)
Have not received HH orders.

## 2017-01-22 NOTE — Telephone Encounter (Signed)
Received fax from [facility] previously mentioned in recent telephone note. Will have Dr. Gwenlyn Found review and sign and scanned into pt chart unless other recommendations from Dr. Gwenlyn Found.

## 2017-01-26 ENCOUNTER — Ambulatory Visit (INDEPENDENT_AMBULATORY_CARE_PROVIDER_SITE_OTHER): Payer: Medicare Other | Admitting: Neurology

## 2017-01-26 ENCOUNTER — Encounter: Payer: Self-pay | Admitting: Neurology

## 2017-01-26 VITALS — BP 119/60 | HR 77 | Ht 63.0 in | Wt 186.0 lb

## 2017-01-26 DIAGNOSIS — R404 Transient alteration of awareness: Secondary | ICD-10-CM

## 2017-01-26 NOTE — Progress Notes (Signed)
[facility] [address] [place] [address] [phone]       OFFICE FOLLOW-UP NOTE  Ms. ANVI MANGAL Date of Birth:  [birth year] Medical Record Number:  [id number]   HPI:  Initial visit 11/18/16 : Ms Koval is a 25 year Caucasian lady seen today for first office follow-up visit following hospital admission in December 2017 for TIA. History is obtained from the patient, daughter and review of Hospital medical records. I personally reviewed imaging  Films.[name] an 83 y.o.femalewith a past medical history that is pertinent for HTN, HLD, atrial fibrillation on xarelto, DVT, and depression, brought in by EMS due to acute onset of dysarthria, blurred vision, and transient unresponsiveness.Patient said that she had " a similar but less intense episode" a week ago.She said that she was at church when she suddenly developed blurred vision and slurred speech, and was unresponsive with snoring respirations for about 5 minutes. Patient states she still has some blurred vision but otherwise denies HA, vertigo, double vision, focal weakness, or visual disturbances./ NIHSS 0. CT head without acute abnormality, ASPECTS 10.Date last known well: 09/06/16.Time last known well: 61 AM. tPA Given:no, has no deficits on exam The patient was on Xarelto for atrial fibrillation but after discussion with her it was clear that she was not taking it exactly at the same time everyday and with proper meals and mostly on empty stomach. MRI scan the brain did not show an acute stroke but only chronic microvascular ischemic changes. MRA of the brain showed 50% stenosis of the left distal M1 segment of the middle cerebral artery which is asymptomatic. Transthoracic echo showed normal ejection fraction. EEG showed no seizure activity. LDL cholesterol was 52 mg percent. Hemoglobin A1c was 6.2. Patient was counseled to take Xarelto consistently with the proper meals at the same time. She states she's done  well since discharge. She had outpatient lesion 10/20/16 which was normal. She is living at home alone but her family lives nearby and checks on her frequently. She uses a walker at home mostly fine dose and has not started using a wheelchair for long distances. She has been started on home oxygen by her cardiologist for congestive heart failure but pulmonologist recently addedt oxygen at night as well. She's had no recurrent stroke or TIA symptoms. Update 01/26/2017 : She returns for follow-up after last visit 2 and half months ago. She is accompanied by her daughter. Patient was hospitalized again twice in March and April 2018 for recurrent episodes. She is had been having 2 different kind of episodes. One of them she has some slurred speech, closes her eyes and goes into deep sleep at times her breathing is shallow. In fact in one of these episodes and son-in-law had to perform CPR for several minutes. The second type episodes will occur when she is awake she becomes quite anxious short of breath the left bladder urgency and has trouble with balance. She reports a quivering feeling in her belly and chest during these episodes. These episodes occur more frequently several times a day. After last visit I had suggested a trial of Keppra for possible seizures however the patient has not yet started that. She had an EEG done in the hospital which was normal. She has been compliant with Xarelto and his episodes have continued despite that. She is currently wearing the external heart monitor the last few weeks. MRI scan of the brain on 01/09/17 did not show any acute abnormality. I spent  a lot of time discussing differential diagnosis of episodes and empirical trial of Keppra and discussing risks benefits and answering questions about possible side effects ROS:   14 system review of systems is positive for  double vision, shortness of breath, chest tightness, chest pain or leg swelling, excessive thirst, daytime  sleepiness, snoring, back pain, walking difficulty, easy bruising and bleeding, dizziness, depression,, anxiety and all other systems negative  PMH:  Past Medical History:  Diagnosis Date  . Atrial fibrillation ([place])    a. on Xarelto  . Chest pain   . COPD (chronic obstructive pulmonary disease) ([place])    family unsure if is copd,   . H/O echocardiogram    a. 08/2016: EF of 60-65%, severely dilated LA, mild pulmonic regurgitations, mild TR, and PA Pressure at 38 mm Hg  . History of depression   . History of DVT (deep vein thrombosis)   . History of pulmonary embolism   . Hyperlipidemia   . Hypertension   . Hypokalemia    Now improved with treatment  . Shortness of breath   . Stroke [facility])     Social History:  Social History   Social History  . Marital status: Widowed    Spouse name: N/A  . Number of children: N/A  . Years of education: N/A   Occupational History  . Not on file.   Social History Main Topics  . Smoking status: Never Smoker  . Smokeless tobacco: Never Used  . Alcohol use No  . Drug use: No  . Sexual activity: No   Other Topics Concern  . Not on file   Social History Narrative  . No narrative on file    Medications:   Current Outpatient Prescriptions on File Prior to Visit  Medication Sig Dispense Refill  . acetaminophen (TYLENOL) 325 MG tablet Take 650 mg by mouth every 6 (six) hours as needed (for pain).     Marland Kitchen ALPRAZolam (XANAX) 0.25 MG tablet Take 1 tablet (0.25 mg total) by mouth 2 (two) times daily as needed for anxiety. 30 tablet 0  . furosemide (LASIX) 40 MG tablet Take 1 tablet (40 mg total) by mouth every morning.    . levETIRAcetam (KEPPRA) 250 MG tablet Take 1 tablet (250 mg total) by mouth 2 (two) times daily. 60 tablet 0  . metoprolol (LOPRESSOR) 25 MG tablet Take 1 tablet (25 mg total) by mouth 2 (two) times daily. 180 tablet 3  . Multiple Vitamins-Minerals (WOMENS 50+ MULTI VITAMIN/MIN) TABS Take 1 tablet by mouth daily.    .  nitroGLYCERIN (NITROSTAT) 0.4 MG SL tablet Place 0.4 mg under the tongue as needed for chest pain. X 3 doses  0  . omeprazole (PRILOSEC) 20 MG capsule Take 20 mg by mouth daily as needed (for reflux/heartburn).   11  . OXYGEN Inhale 2 L into the lungs continuous.     . potassium chloride SA (K-DUR,KLOR-CON) 20 MEQ tablet Take 2 tablets every morning 90 tablet 3  . pravastatin (PRAVACHOL) 40 MG tablet Take 40 mg by mouth every morning.     . Rivaroxaban (XARELTO) 15 MG TABS tablet Take 1 tablet (15 mg total) by mouth every morning. 14 tablet 0  . venlafaxine XR (EFFEXOR-XR) 150 MG 24 hr capsule Take 150 mg by mouth daily.    Marland Kitchen zolpidem (AMBIEN) 5 MG tablet Take 5 mg by mouth at bedtime.     No current facility-administered medications on file prior to visit.     Allergies:  No Known Allergies  Physical Exam General: Mildly obese elderly Caucasian lady seated, in no evident distress.   Head: head normocephalic and atraumatic.  Neck: supple with no carotid or supraclavicular bruits Cardiovascular: regular rate and rhythm, no murmurs Musculoskeletal: no deformity Skin:  no rash/petichiae Vascular:  Normal pulses all extremities Vitals:   01/26/17 1602  BP: 119/60  Pulse: 77   Neurologic Exam Mental Status: Awake and fully alert. Oriented to place and time. Recent and remote memory intact. Attention span, concentration and fund of knowledge appropriate. Mood and affect appropriate.  Cranial Nerves: Fundoscopic exam not done  . Pupils equal, briskly reactive to light. Extraocular movements full without nystagmus. Visual fields full to confrontation. Hearing intact. Facial sensation intact. Face, tongue, palate moves normally and symmetrically.  Motor: Normal bulk and tone. Normal strength in all tested extremity muscles. Sensory.: intact to touch ,pinprick .position and vibratory sensation.  Coordination: Rapid alternating movements normal in all extremities. Finger-to-nose and heel-to-shin  performed accurately bilaterally. Gait and Station: Arises from wheelchair with  difficulty. Stance is wide-based l. Gait demonstrates normal stride length and imbalance . Unable to heel, toe and tandem walk without difficulty.  Reflexes: 1+ and symmetric. Toes downgoing.   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 Vascular risk factors of hypertension, hyperlipidemia , congestive heart failure and obesity. His recurrent episodes of transient altered consciousness with some slurred speech for unclear etiology. Vertebrobasilar TIAs versus complex partial seizures. Episodes continue despite being compliant with Xarelto now hence possible trial of anticonvulsant may be worthwhile  PLAN: I had a long discussion with the patient and her daughter regarding her 3 recurrent stereotypical episodes of brief loss of consciousness with speech difficulties  of unclear etiology. Possibilities include syncopal episodes versus seizures. Vertebrobasilar TIAs   less likely. Continue Xarelto for stroke prevention given history of atrial fibrillation. After discussion the patient and daughter artery greater territory and pedicle trial of anticonvulsants. Recommend start Keppra 250 mg twice daily. I discussed possible side effects and asked her to call me if needed. I also discussed fall and safety precautions and advised her to use her cane and walker at all times. She will return for follow-up in the future and already has an appointment with my nurse practitioner in a few months. Greater than 50% of time during this 25 minute visit was spent on counseling,explanation of diagnosis, planning of further management, discussion with patient and family and coordination of  care Antony Contras, MD  [facility] [address] [place], [place] [address]  Phone [phone] Fax [phone] Note: This document was prepared with digital dictation and possible smart phrase technology. Any transcriptional errors that result from this process are unintentional

## 2017-01-26 NOTE — Patient Instructions (Signed)
I had a long discussion with the patient and her daughter regarding her 3 recurrent stereotypical episodes of brief loss of consciousness with speech difficulties  of unclear etiology. Possibilities include syncopal episodes versus seizures. Vertebrobasilar TIAs   less likely. Continue Xarelto for stroke prevention given history of atrial fibrillation. After discussion the patient and daughter artery greater territory and pedicle trial of anticonvulsants. Recommend start Keppra 250 mg twice daily. I discussed possible side effects and asked her to call me if needed. I also discussed fall and safety precautions and advised her to use her cane and walker at all times. She will return for follow-up in the future and already has an appointment with my nurse practitioner in a few months. Levetiracetam tablets What is this medicine? LEVETIRACETAM (lee ve tye RA se tam) is an antiepileptic drug. It is used with other medicines to treat certain types of seizures. This medicine may be used for other purposes; ask your health care provider or pharmacist if you have questions. COMMON BRAND NAME(S): Keppra, Roweepra What should I tell my health care provider before I take this medicine? They need to know if you have any of these conditions: -kidney disease -suicidal thoughts, plans, or attempt; a previous suicide attempt by you or a family member -an unusual or allergic reaction to levetiracetam, other medicines, foods, dyes, or preservatives -pregnant or trying to get pregnant -breast-feeding How should I use this medicine? Take this medicine by mouth with a glass of water. Follow the directions on the prescription label. Swallow the tablets whole. Do not crush or chew this medicine. You may take this medicine with or without food. Take your doses at regular intervals. Do not take your medicine more often than directed. Do not stop taking this medicine or any of your seizure medicines unless instructed by your  doctor or health care professional. Stopping your medicine suddenly can increase your seizures or their severity. A special MedGuide will be given to you by the pharmacist with each prescription and refill. Be sure to read this information carefully each time. Contact your pediatrician or health care professional regarding the use of this medication in children. While this drug may be prescribed for children as young as 69 years of age for selected conditions, precautions do apply. Overdosage: If you think you have taken too much of this medicine contact a poison control center or emergency room at once. NOTE: This medicine is only for you. Do not share this medicine with others. What if I miss a dose? If you miss a dose, take it as soon as you can. If it is almost time for your next dose, take only that dose. Do not take double or extra doses. What may interact with this medicine? This medicine may interact with the following medications: -carbamazepine -colesevelam -probenecid -sevelamer This list may not describe all possible interactions. Give your health care provider a list of all the medicines, herbs, non-prescription drugs, or dietary supplements you use. Also tell them if you smoke, drink alcohol, or use illegal drugs. Some items may interact with your medicine. What should I watch for while using this medicine? Visit your doctor or health care professional for a regular check on your progress. Wear a medical identification bracelet or chain to say you have epilepsy, and carry a card that lists all your medications. It is important to take this medicine exactly as instructed by your health care professional. When first starting treatment, your dose may need to be adjusted. It  may take weeks or months before your dose is stable. You should contact your doctor or health care professional if your seizures get worse or if you have any new types of seizures. You may get drowsy or dizzy. Do not  drive, use machinery, or do anything that needs mental alertness until you know how this medicine affects you. Do not stand or sit up quickly, especially if you are an older patient. This reduces the risk of dizzy or fainting spells. Alcohol may interfere with the effect of this medicine. Avoid alcoholic drinks. The use of this medicine may increase the chance of suicidal thoughts or actions. Pay special attention to how you are responding while on this medicine. Any worsening of mood, or thoughts of suicide or dying should be reported to your health care professional right away. Women who become pregnant while using this medicine may enroll in the [place] Pregnancy Registry by calling [phone]. This registry collects information about the safety of antiepileptic drug use during pregnancy. What side effects may I notice from receiving this medicine? Side effects that you should report to your doctor or health care professional as soon as possible: -allergic reactions like skin rash, itching or hives, swelling of the face, lips, or tongue -breathing problems -dark urine -general ill feeling or flu-like symptoms -problems with balance, talking, walking -unusually weak or tired -worsening of mood, thoughts or actions of suicide or dying -yellowing of the eyes or skin Side effects that usually do not require medical attention (report to your doctor or health care professional if they continue or are bothersome): -diarrhea -dizzy, drowsy -headache -loss of appetite This list may not describe all possible side effects. Call your doctor for medical advice about side effects. You may report side effects to FDA at 1-800-FDA-1088. Where should I keep my medicine? Keep out of reach of children. Store at room temperature between 15 and 30 degrees C (59 and 86 degrees F). Throw away any unused medicine after the expiration date. NOTE: This sheet is a summary. It may not cover  all possible information. If you have questions about this medicine, talk to your doctor, pharmacist, or health care provider.  2018 Elsevier/Gold Standard (2015-10-11 09:43:54)

## 2017-01-27 DIAGNOSIS — J449 Chronic obstructive pulmonary disease, unspecified: Secondary | ICD-10-CM | POA: Diagnosis not present

## 2017-01-28 ENCOUNTER — Telehealth: Payer: Self-pay | Admitting: Cardiovascular Disease

## 2017-01-28 ENCOUNTER — Encounter: Payer: Self-pay | Admitting: Cardiovascular Disease

## 2017-01-28 NOTE — Telephone Encounter (Signed)
Mel Almond w [employer] calling to report abnormal EKG reading on this patient's event monitor. Denoted a 3.8 pause, rhythm returned to A Fib afterwards. Awaiting receipt of faxed strip, will bring to Dr. Gwenlyn Found once obtained.

## 2017-01-28 NOTE — Telephone Encounter (Signed)
Have attempted to call patient but unable to reach her.  Left msg for [employer] requesting fax of strip - so far no fax received.

## 2017-01-28 NOTE — Telephone Encounter (Signed)
I spoke w [name], pt daughter/DPR who notes that she received call from [place], no active symptoms reported.  Spoke w Dr. Percival Spanish and he reviewed strip, rec'd best for pt to return for appt w Dr. Gwenlyn Found or APP in 1-2 weeks.  Communicated this advice to daughter, who voiced understanding. I am unable to add on to Dr. Kennon Holter schedule w current template. She's aware I have sent msg to scheduler to help arrange this. She is OK w APP visit if Dr. Gwenlyn Found unavailable in this time frame.

## 2017-01-28 NOTE — Telephone Encounter (Signed)
New message       Calling with an abn ekg reading

## 2017-01-28 NOTE — Telephone Encounter (Signed)
Fax received but Dr. Gwenlyn Found gone for the day, returning next week to office. Will review w Dr. Percival Spanish ([facility]) for recommendations.

## 2017-02-02 ENCOUNTER — Telehealth: Payer: Self-pay | Admitting: Cardiovascular Disease

## 2017-02-02 NOTE — Telephone Encounter (Signed)
Spoke with [facility] and received monitor report, A Fib 3 sec pause noted. Patient has had in past 3.8 sec pause. Patient currently taking Xarelto Spoke with [name], patients daughter and patient was feeling fine. She did state that her mother has these "episodes" where she feels like she is seeing lines. Episodes only last a few minutes and then she feels tired. Patient does not hit the record button when this happens but she will try to have her do so. Again daughter stated she had seen her mother and spoken with her a couple of times and stated she was feeling fine. Patient has appointment 02/06/17 with Donnie Aho PA Discussed with Bernerd Pho PA who also reveiwed strips, will have patient continue to monitor and keep appointment as scheduled.

## 2017-02-02 NOTE — Telephone Encounter (Signed)
Appt scheduled with Rosaria Ferries, PA-C on 02-06-17.  FYI.

## 2017-02-02 NOTE — Telephone Encounter (Signed)
New message      Calling to report an abn ekg

## 2017-02-03 ENCOUNTER — Telehealth: Payer: Self-pay | Admitting: Physician Assistant

## 2017-02-04 ENCOUNTER — Encounter: Payer: Self-pay | Admitting: Cardiovascular Disease

## 2017-02-04 ENCOUNTER — Ambulatory Visit (INDEPENDENT_AMBULATORY_CARE_PROVIDER_SITE_OTHER): Payer: Medicare Other | Admitting: Cardiovascular Disease

## 2017-02-04 VITALS — BP 119/62 | HR 58 | Ht 63.0 in | Wt 185.6 lb

## 2017-02-04 DIAGNOSIS — I10 Essential (primary) hypertension: Secondary | ICD-10-CM | POA: Diagnosis not present

## 2017-02-04 DIAGNOSIS — R55 Syncope and collapse: Secondary | ICD-10-CM

## 2017-02-04 DIAGNOSIS — I208 Other forms of angina pectoris: Secondary | ICD-10-CM

## 2017-02-04 DIAGNOSIS — E7800 Pure hypercholesterolemia, unspecified: Secondary | ICD-10-CM

## 2017-02-04 DIAGNOSIS — I2699 Other pulmonary embolism without acute cor pulmonale: Secondary | ICD-10-CM

## 2017-02-04 NOTE — Assessment & Plan Note (Signed)
History of chronic atrial fibrillation on Xarelto oral anticoagulation. She is rate controlled on beta blocker. She has had episodes of loss of consciousness with a recent event monitor that showed long pauses. I'm going to refer her to Dr. Sallyanne Kuster for discussion regarding permanent transvenous pacemaker insertion.

## 2017-02-04 NOTE — Assessment & Plan Note (Addendum)
Patient expresses an occasional anginal chest pain. She does have a history of a low risk Myoview stress test. She was on low-dose long-acting nitrates which were recently discontinued because of orthostasis.

## 2017-02-04 NOTE — Assessment & Plan Note (Signed)
History of syncope with recent monitor that showed A. fib with occasional long pauses which may be contributory. Because of this, I am going to refer her to Dr. Sallyanne Kuster to discuss the possibility of a permanent pacemaker implantation.

## 2017-02-04 NOTE — Assessment & Plan Note (Signed)
History of hyperlipidemia on statin therapy with recent lipid profile performed 01/09/17 revealed total cholesterol 1:15, LDL 58 and HDL 40

## 2017-02-04 NOTE — Telephone Encounter (Signed)
Pt seen in office by Dr. Gwenlyn Found today.

## 2017-02-04 NOTE — Assessment & Plan Note (Signed)
History of essential hypertension blood pressure measured 10 119/62. She is on metoprolol. Continue current meds at current dosing

## 2017-02-04 NOTE — Progress Notes (Signed)
02/04/2017 Raven Gray   [birth year]  [id number]  Primary Physician Raven Kraft, MD Primary Cardiologist: Raven Harp MD Raven Gray  HPI:  [name] is an 83 year old moderately overweight married Caucasian female mother of 4 daughters, grandmother to 26 grandchildren was accompanied by one of her daughters Raven Gray today. I last saw her in the office 06/25/16.. She unfortunately lost her oldest daughter Raven Gray 2016 2 diabetes.She was referred by Dr. Wilson Singer for new-onset A. Fib and chest pain. The patient has a remote history of a pulmonary embolism and DVT on Xarelto oral autoregulation. Problems include hypertension, hyperlipidemia and COPD. She does have a family history of heart disease in the father who died of an MI. She does have a daughter who's had stents. She has never had a heart attack or stroke. Over the last month she said it was a chest pain which awakened her from sleep. She also feels weak and sluggish. An EKG performed by her primary care physician revealed atrial fibrillation which was newly recognized. I performed a 2-D echo cardiogram which was normal as was a Myoview stress test. She has had several ER visits/admissions for atypical chest pain and shortness of breath. A recent 2-D echo performed 10/21/14 revealed normal LV size and function, moderate aortic insufficiency with a mildly dilated left atrium. We have discussed the possibility of outpatient cardioversion however the patient does not wish to pursue this at this time. Because of ongoing chest pain she had a Myoview stress test performed 02/22/16 which was low risk. Because of ongoing chest pain which does have ischemic discuss the possibility of performing outpatient cardiac catheterization which the patient currently does not wish to pursue. As result of that I elected to increase her Imdur from 30-60 mg a day and will follow her clinically closely as an outpatient. I saw her back 8 months ago she has had several  admissions for hypotension, syncope and encephalopathy. Her Imdur was discontinued. An event monitor did show A. fib with long pauses.    Current Outpatient Prescriptions  Medication Sig Dispense Refill  . acetaminophen (TYLENOL) 325 MG tablet Take 650 mg by mouth every 6 (six) hours as needed (for pain).     Marland Kitchen ALPRAZolam (XANAX) 0.25 MG tablet Take 1 tablet (0.25 mg total) by mouth 2 (two) times daily as needed for anxiety. 30 tablet 0  . furosemide (LASIX) 40 MG tablet Take 1 tablet (40 mg total) by mouth every morning.    . levETIRAcetam (KEPPRA) 250 MG tablet Take 1 tablet (250 mg total) by mouth 2 (two) times daily. 60 tablet 0  . metoprolol (LOPRESSOR) 25 MG tablet Take 1 tablet (25 mg total) by mouth 2 (two) times daily. 180 tablet 3  . Multiple Vitamins-Minerals (WOMENS 50+ MULTI VITAMIN/MIN) TABS Take 1 tablet by mouth daily.    . nitroGLYCERIN (NITROSTAT) 0.4 MG SL tablet Place 0.4 mg under the tongue as needed for chest pain. X 3 doses  0  . omeprazole (PRILOSEC) 20 MG capsule Take 20 mg by mouth daily as needed (for reflux/heartburn).   11  . OXYGEN Inhale 2 L into the lungs continuous.     . potassium chloride SA (K-DUR,KLOR-CON) 20 MEQ tablet Take 2 tablets every morning 90 tablet 3  . pravastatin (PRAVACHOL) 40 MG tablet Take 40 mg by mouth every morning.     . Rivaroxaban (XARELTO) 15 MG TABS tablet Take 1 tablet (15 mg total) by mouth every morning. [place]  tablet 0  . venlafaxine XR (EFFEXOR-XR) 150 MG 24 hr capsule Take 150 mg by mouth daily.    Marland Kitchen zolpidem (AMBIEN) 5 MG tablet Take 5 mg by mouth at bedtime.     No current facility-administered medications for this visit.     No Known Allergies  Social History   Social History  . Marital status: Widowed    Spouse name: N/A  . Number of children: N/A  . Years of education: N/A   Occupational History  . Not on file.   Social History Main Topics  . Smoking status: Never Smoker  . Smokeless tobacco: Never Used  .  Alcohol use No  . Drug use: No  . Sexual activity: No   Other Topics Concern  . Not on file   Social History Narrative  . No narrative on file     Review of Systems: General: negative for chills, fever, night sweats or weight changes.  Cardiovascular: negative for chest pain, dyspnea on exertion, edema, orthopnea, palpitations, paroxysmal nocturnal dyspnea or shortness of breath Dermatological: negative for rash Respiratory: negative for cough or wheezing Urologic: negative for hematuria Abdominal: negative for nausea, vomiting, diarrhea, bright red blood per rectum, melena, or hematemesis Neurologic: negative for visual changes, syncope, or dizziness All other systems reviewed and are otherwise negative except as noted above.    Blood pressure 119/62, pulse (!) 58, height 5\' 3"  (1.6 m), weight 185 lb 9.6 oz (84.2 kg), SpO2 97 %.  General appearance: alert and no distress Neck: no adenopathy, no carotid bruit, no JVD, supple, symmetrical, trachea midline and thyroid not enlarged, symmetric, no tenderness/mass/nodules Lungs: clear to auscultation bilaterally Heart: irregularly irregular rhythm Extremities: extremities normal, atraumatic, no cyanosis or edema  EKG not performed today  ASSESSMENT AND PLAN:   Atrial fibrillation (HCC) History of chronic atrial fibrillation on Xarelto oral anticoagulation. She is rate controlled on beta blocker. She has had episodes of loss of consciousness with a recent event monitor that showed long pauses. I'm going to refer her to Dr. Sallyanne Kuster for discussion regarding permanent transvenous pacemaker insertion.  Chest pain Patient expresses an occasional anginal chest pain. She does have a history of a low risk Myoview stress test. She was on low-dose long-acting nitrates which were recently discontinued because of orthostasis.  Hyperlipidemia History of hyperlipidemia on statin therapy with recent lipid profile performed 01/09/17 revealed total  cholesterol 1:15, LDL 58 and HDL 40  Essential hypertension History of essential hypertension blood pressure measured 10 119/62. She is on metoprolol. Continue current meds at current dosing  Syncope and collapse History of syncope with recent monitor that showed A. fib with occasional long pauses which may be contributory. Because of this, I am going to refer her to Dr. Sallyanne Kuster to discuss the possibility of a permanent pacemaker implantation.      Raven Harp MD FACP,FACC,FAHA, [facility] 02/04/2017 11:54 AM

## 2017-02-04 NOTE — Patient Instructions (Signed)
Medication Instructions: Your physician recommends that you continue on your current medications as directed. Please refer to the Current Medication list given to you today.   Follow-Up: Your physician recommends that you schedule a follow-up appointment with Sanda Klein, MD for evaluation for Pacemaker Implantation.  Your physician wants you to follow-up in: 6 months with Dr. Gwenlyn Found. You will receive a reminder letter in the mail two months in advance. If you don't receive a letter, please call our office to schedule the follow-up appointment.  If you need a refill on your cardiac medications before your next appointment, please call your pharmacy.

## 2017-02-05 NOTE — Telephone Encounter (Signed)
close encounter °

## 2017-02-06 ENCOUNTER — Ambulatory Visit: Payer: Medicare Other | Admitting: Physician Assistant

## 2017-02-19 ENCOUNTER — Ambulatory Visit (INDEPENDENT_AMBULATORY_CARE_PROVIDER_SITE_OTHER): Payer: Medicare Other | Admitting: Cardiovascular Disease

## 2017-02-19 ENCOUNTER — Encounter: Payer: Self-pay | Admitting: Cardiovascular Disease

## 2017-02-19 VITALS — BP 143/70 | HR 71 | Ht 63.0 in | Wt 182.6 lb

## 2017-02-19 DIAGNOSIS — Z86711 Personal history of pulmonary embolism: Secondary | ICD-10-CM

## 2017-02-19 DIAGNOSIS — I482 Chronic atrial fibrillation, unspecified: Secondary | ICD-10-CM

## 2017-02-19 DIAGNOSIS — R55 Syncope and collapse: Secondary | ICD-10-CM | POA: Diagnosis not present

## 2017-02-19 NOTE — Patient Instructions (Signed)
Pacemaker Implantation, Adult Pacemaker implantation is a procedure to place a pacemaker inside your chest. A pacemaker is a small computer that sends electrical signals to the heart and helps your heart beat normally. A pacemaker also stores information about your heart rhythms. You may need pacemaker implantation if you:  Have a slow heartbeat (bradycardia).  Faint (syncope).  Have shortness of breath (dyspnea) due to heart problems.  The pacemaker attaches to your heart through a wire, called a lead. Sometimes just one lead is needed. Other times, there will be two leads. There are two types of pacemakers:  Transvenous pacemaker. This type is placed under the skin or muscle of your chest. The lead goes through a vein in the chest area to reach the inside of the heart.  Epicardial pacemaker. This type is placed under the skin or muscle of your chest or belly. The lead goes through your chest to the outside of the heart.  Tell a health care provider about:  Any allergies you have.  All medicines you are taking, including vitamins, herbs, eye drops, creams, and over-the-counter medicines.  Any problems you or family members have had with anesthetic medicines.  Any blood or bone disorders you have.  Any surgeries you have had.  Any medical conditions you have.  Whether you are pregnant or may be pregnant. What are the risks? Generally, this is a safe procedure. However, problems may occur, including:  Infection.  Bleeding.  Failure of the pacemaker or the lead.  Collapse of a lung or bleeding into a lung.  Blood clot inside a blood vessel with a lead.  Damage to the heart.  Infection inside the heart (endocarditis).  Allergic reactions to medicines.  What happens before the procedure? Staying hydrated Follow instructions from your health care provider about hydration, which may include:  Up to 2 hours before the procedure - you may continue to drink clear liquids,  such as water, clear fruit juice, black coffee, and plain tea.  Eating and drinking restrictions Follow instructions from your health care provider about eating and drinking, which may include:  8 hours before the procedure - stop eating heavy meals or foods such as meat, fried foods, or fatty foods.  6 hours before the procedure - stop eating light meals or foods, such as toast or cereal.  6 hours before the procedure - stop drinking milk or drinks that contain milk.  2 hours before the procedure - stop drinking clear liquids.  Medicines  Ask your health care provider about: ? Changing or stopping your regular medicines. This is especially important if you are taking diabetes medicines or blood thinners. ? Taking medicines such as aspirin and ibuprofen. These medicines can thin your blood. Do not take these medicines before your procedure if your health care provider instructs you not to.  You may be given antibiotic medicine to help prevent infection. General instructions  You will have a heart evaluation. This may include an electrocardiogram (ECG), chest X-ray, and heart imaging (echocardiogram,  or echo) tests.  You will have blood tests.  Do not use any products that contain nicotine or tobacco, such as cigarettes and e-cigarettes. If you need help quitting, ask your health care provider.  Plan to have someone take you home from the hospital or clinic.  If you will be going home right after the procedure, plan to have someone with you for 24 hours.  Ask your health care provider how your surgical site will be marked or   identified. What happens during the procedure?  To reduce your risk of infection: ? Your health care team will wash or sanitize their hands. ? Your skin will be washed with soap. ? Hair may be removed from the surgical area.  An IV tube will be inserted into one of your veins.  You will be given one or more of the following: ? A medicine to help you  relax (sedative). ? A medicine to numb the area (local anesthetic). ? A medicine to make you fall asleep (general anesthetic).  If you are getting a transvenous pacemaker: ? An incision will be made in your upper chest. ? A pocket will be made for the pacemaker. It may be placed under the skin or between layers of muscle. ? The lead will be inserted into a blood vessel that returns to the heart. ? While X-rays are taken by an imaging machine (fluoroscopy), the lead will be advanced through the vein to the inside of your heart. ? The other end of the lead will be tunneled under the skin and attached to the pacemaker.  If you are getting an epicardial pacemaker: ? An incision will be made near your ribs or breastbone (sternum) for the lead. ? The lead will be attached to the outside of your heart. ? Another incision will be made in your chest or upper belly to create a pocket for the pacemaker. ? The free end of the lead will be tunneled under the skin and attached to the pacemaker.  The transvenous or epicardial pacemaker will be tested. Imaging studies may be done to check the lead position.  The incisions will be closed with stitches (sutures), adhesive strips, or skin glue.  Bandages (dressing) will be placed over the incisions. The procedure may vary among health care providers and hospitals. What happens after the procedure?  Your blood pressure, heart rate, breathing rate, and blood oxygen level will be monitored until the medicines you were given have worn off.  You will be given antibiotics and pain medicine.  ECG and chest x-rays will be done.  You will wear a continuous type of ECG (Holter monitor) to check your heart rhythm.  Your health care provider willprogram the pacemaker.  Do not drive for 24 hours if you received a sedative. This information is not intended to replace advice given to you by your health care provider. Make sure you discuss any questions you have  with your health care provider. Document Released: 08/29/2002 Document Revised: 03/28/2016 Document Reviewed: 02/20/2016 [employer] Interactive Patient Education  2018 [employer]  

## 2017-02-20 ENCOUNTER — Other Ambulatory Visit: Payer: Self-pay | Admitting: Cardiovascular Disease

## 2017-02-20 NOTE — Progress Notes (Signed)
Cardiology Office Note:    Date:  02/20/2017   ID:  Raven Gray, DOB October 06, 1927, MRN [id number]  PCP:  Anda Kraft, MD  Cardiologist:  Sanda Klein, MD ; Quay Burow, M.D.    Referring MD: Anda Kraft, MD   Chief Complaint  Patient presents with  . Shortness of Breath    some  . Edema    both feet    Referred for pacemaker implantation after lengthy pauses were recorded on event monitor.  History of Present Illness:    Raven Gray is a 83 y.o. female with a hx of Atrial fibrillation, remote DVT/pulmonary embolism on chronic Xarelto anticoagulation, hypertension, hyperlipidemia, COPD with recent complaints of weakness and sluggishness and newly diagnosed atrial fibrillation. She has had at least one episode of syncope. Echocardiogram and nuclear stress test did not show evidence of left ventricular dysfunction or significant coronary insufficiency. She complains of exertional dyspnea.  Her event monitor has shown atrial fibrillation with slow ventricular response and pauses of 3-4 seconds. She is on a very low dose of beta blocker which has been necessary for rate control. The beta blocker is felt to be a necessary medication for her.  Past Medical History:  Diagnosis Date  . Atrial fibrillation ([place])    a. on Xarelto  . Chest pain   . COPD (chronic obstructive pulmonary disease) ([place])    family unsure if is copd,   . H/O echocardiogram    a. 08/2016: EF of 60-65%, severely dilated LA, mild pulmonic regurgitations, mild TR, and PA Pressure at 38 mm Hg  . History of depression   . History of DVT (deep vein thrombosis)   . History of pulmonary embolism   . Hyperlipidemia   . Hypertension   . Hypokalemia    Now improved with treatment  . Shortness of breath   . Stroke [facility])     Past Surgical History:  Procedure Laterality Date  . REPLACEMENT TOTAL KNEE    . ROTATOR CUFF REPAIR      Current Medications: Current Meds  Medication Sig  . acetaminophen (TYLENOL) 325  MG tablet Take 650 mg by mouth every 6 (six) hours as needed (for pain).   Marland Kitchen ALPRAZolam (XANAX) 0.25 MG tablet Take 1 tablet (0.25 mg total) by mouth 2 (two) times daily as needed for anxiety.  . furosemide (LASIX) 40 MG tablet Take 1 tablet (40 mg total) by mouth every morning.  . levETIRAcetam (KEPPRA) 250 MG tablet Take 1 tablet (250 mg total) by mouth 2 (two) times daily.  . metoprolol (LOPRESSOR) 25 MG tablet Take 1 tablet (25 mg total) by mouth 2 (two) times daily.  . Multiple Vitamins-Minerals (WOMENS 50+ MULTI VITAMIN/MIN) TABS Take 1 tablet by mouth daily.  . nitroGLYCERIN (NITROSTAT) 0.4 MG SL tablet Place 0.4 mg under the tongue as needed for chest pain. X 3 doses  . omeprazole (PRILOSEC) 20 MG capsule Take 20 mg by mouth daily as needed (for reflux/heartburn).   . OXYGEN Inhale 2 L into the lungs continuous.   . potassium chloride SA (K-DUR,KLOR-CON) 20 MEQ tablet Take 2 tablets every morning  . pravastatin (PRAVACHOL) 40 MG tablet Take 40 mg by mouth every morning.   . Rivaroxaban (XARELTO) 15 MG TABS tablet Take 1 tablet (15 mg total) by mouth every morning.  . venlafaxine XR (EFFEXOR-XR) 150 MG 24 hr capsule Take 150 mg by mouth daily.  Marland Kitchen zolpidem (AMBIEN) 5 MG tablet Take 5 mg by mouth at bedtime.  Allergies:   Patient has no known allergies.   Social History   Social History  . Marital status: Widowed    Spouse name: N/A  . Number of children: N/A  . Years of education: N/A   Social History Main Topics  . Smoking status: Never Smoker  . Smokeless tobacco: Never Used  . Alcohol use No  . Drug use: No  . Sexual activity: No   Other Topics Concern  . None   Social History Narrative  . None     Family History: The patient's family history includes Alzheimer's disease in her mother; Diabetes type II in her daughter; Heart attack in her father; Heart disease in her daughter. ROS:   Please see the history of present illness.     All other systems reviewed and  are negative.  EKGs/Labs/Other Studies Reviewed:    The following studies were reviewed today: Attending Dr. Quay Burow, recent event monitor, recent echocardiogram, nuclear stress test and carotid ultrasound.  EKG:  EKG is not ordered today.  The ekg ordered 4/19/2018demonstrates atrial fibrillation with old right bundle branch block, well controlled ventricular rate.  Recent Labs: 10/23/2016: Brain Natriuretic Peptide 306.7 01/08/2017: ALT 11; Hemoglobin 11.6; Platelets 205 01/09/2017: BUN 15; Creatinine, Ser 1.06; Magnesium 1.7; Potassium 3.7; Sodium 141   Recent Lipid Panel    Component Value Date/Time   CHOL 115 01/09/2017 0356   TRIG 86 01/09/2017 0356   HDL 40 (L) 01/09/2017 0356   CHOLHDL 2.9 01/09/2017 0356   VLDL 17 01/09/2017 0356   LDLCALC 58 01/09/2017 0356    Physical Exam:    VS:  BP (!) 143/70   Pulse 71   Ht 5\' 3"  (1.6 m)   Wt 182 lb 9.6 oz (82.8 kg)   SpO2 95%   BMI 32.35 kg/m     Wt Readings from Last 3 Encounters:  02/19/17 182 lb 9.6 oz (82.8 kg)  02/04/17 185 lb 9.6 oz (84.2 kg)  01/26/17 186 lb (84.4 kg)     GEN: Elderly, Well nourished, well developed in no acute distress HEENT: Normal NECK: No JVD; No carotid bruits LYMPHATICS: No lymphadenopathy CARDIAC: Irregular, 2/6 early peaking systolic ejection murmur in the aortic focus, no diastolic murmurs, rubs, gallops RESPIRATORY:  Clear to auscultation without rales, wheezing or rhonchi  ABDOMEN: Soft, non-tender, non-distended MUSCULOSKELETAL:  No edema; No deformity  SKIN: Warm and dry NEUROLOGIC:  Alert and oriented x 3 PSYCHIATRIC:  Normal affect   ASSESSMENT:    1. Syncope and collapse   2. Chronic atrial fibrillation (HCC)   3. History of pulmonary embolism    PLAN:    In order of problems listed above:  1. Syncope was likely caused by prolonged pauses in the setting of atrial fibrillation with slow ventricular response. She has evidence of infra-his see an disease with right  bundle branch block. She'll benefit from implantation of a single-chamber permanent pacemaker. This procedure has been fully reviewed with the patient and informed consent has been obtained. I offered to schedule this with one of my partners if she wants to have it sooner. She would prefer to wait and will go ahead with it on July 11. 2. AFib: On chronic Xarelto anticoagulation. Will stop this medication for just 2 doses prior to pacemaker implantation and plan to restart it immediately thereafter. 3. Pulmonary embolism diagnosed in 2013, no recent symptoms to suggest active DVT/PE.    Medication Adjustments/Labs and Tests Ordered: Current medicines are reviewed at length  with the patient today.  Concerns regarding medicines are outlined above. Labs and tests ordered and medication changes are outlined in the patient instructions below:  Patient Instructions  Pacemaker Implantation, Adult Pacemaker implantation is a procedure to place a pacemaker inside your chest. A pacemaker is a small computer that sends electrical signals to the heart and helps your heart beat normally. A pacemaker also stores information about your heart rhythms. You may need pacemaker implantation if you:  Have a slow heartbeat (bradycardia).  Faint (syncope).  Have shortness of breath (dyspnea) due to heart problems.  The pacemaker attaches to your heart through a wire, called a lead. Sometimes just one lead is needed. Other times, there will be two leads. There are two types of pacemakers:  Transvenous pacemaker. This type is placed under the skin or muscle of your chest. The lead goes through a vein in the chest area to reach the inside of the heart.  Epicardial pacemaker. This type is placed under the skin or muscle of your chest or belly. The lead goes through your chest to the outside of the heart.  Tell a health care provider about:  Any allergies you have.  All medicines you are taking, including vitamins,  herbs, eye drops, creams, and over-the-counter medicines.  Any problems you or family members have had with anesthetic medicines.  Any blood or bone disorders you have.  Any surgeries you have had.  Any medical conditions you have.  Whether you are pregnant or may be pregnant. What are the risks? Generally, this is a safe procedure. However, problems may occur, including:  Infection.  Bleeding.  Failure of the pacemaker or the lead.  Collapse of a lung or bleeding into a lung.  Blood clot inside a blood vessel with a lead.  Damage to the heart.  Infection inside the heart (endocarditis).  Allergic reactions to medicines.  What happens before the procedure? Staying hydrated Follow instructions from your health care provider about hydration, which may include:  Up to 2 hours before the procedure - you may continue to drink clear liquids, such as water, clear fruit juice, black coffee, and plain tea.  Eating and drinking restrictions Follow instructions from your health care provider about eating and drinking, which may include:  8 hours before the procedure - stop eating heavy meals or foods such as meat, fried foods, or fatty foods.  6 hours before the procedure - stop eating light meals or foods, such as toast or cereal.  6 hours before the procedure - stop drinking milk or drinks that contain milk.  2 hours before the procedure - stop drinking clear liquids.  Medicines  Ask your health care provider about: ? Changing or stopping your regular medicines. This is especially important if you are taking diabetes medicines or blood thinners. ? Taking medicines such as aspirin and ibuprofen. These medicines can thin your blood. Do not take these medicines before your procedure if your health care provider instructs you not to.  You may be given antibiotic medicine to help prevent infection. General instructions  You will have a heart evaluation. This may include an  electrocardiogram (ECG), chest X-ray, and heart imaging (echocardiogram,  or echo) tests.  You will have blood tests.  Do not use any products that contain nicotine or tobacco, such as cigarettes and e-cigarettes. If you need help quitting, ask your health care provider.  Plan to have someone take you home from the hospital or clinic.  If you will  be going home right after the procedure, plan to have someone with you for 24 hours.  Ask your health care provider how your surgical site will be marked or identified. What happens during the procedure?  To reduce your risk of infection: ? Your health care team will wash or sanitize their hands. ? Your skin will be washed with soap. ? Hair may be removed from the surgical area.  An IV tube will be inserted into one of your veins.  You will be given one or more of the following: ? A medicine to help you relax (sedative). ? A medicine to numb the area (local anesthetic). ? A medicine to make you fall asleep (general anesthetic).  If you are getting a transvenous pacemaker: ? An incision will be made in your upper chest. ? A pocket will be made for the pacemaker. It may be placed under the skin or between layers of muscle. ? The lead will be inserted into a blood vessel that returns to the heart. ? While X-rays are taken by an imaging machine (fluoroscopy), the lead will be advanced through the vein to the inside of your heart. ? The other end of the lead will be tunneled under the skin and attached to the pacemaker.  If you are getting an epicardial pacemaker: ? An incision will be made near your ribs or breastbone (sternum) for the lead. ? The lead will be attached to the outside of your heart. ? Another incision will be made in your chest or upper belly to create a pocket for the pacemaker. ? The free end of the lead will be tunneled under the skin and attached to the pacemaker.  The transvenous or epicardial pacemaker will be tested.  Imaging studies may be done to check the lead position.  The incisions will be closed with stitches (sutures), adhesive strips, or skin glue.  Bandages (dressing) will be placed over the incisions. The procedure may vary among health care providers and hospitals. What happens after the procedure?  Your blood pressure, heart rate, breathing rate, and blood oxygen level will be monitored until the medicines you were given have worn off.  You will be given antibiotics and pain medicine.  ECG and chest x-rays will be done.  You will wear a continuous type of ECG (Holter monitor) to check your heart rhythm.  Your health care provider willprogram the pacemaker.  Do not drive for 24 hours if you received a sedative. This information is not intended to replace advice given to you by your health care provider. Make sure you discuss any questions you have with your health care provider. Document Released: 08/29/2002 Document Revised: 03/28/2016 Document Reviewed: 02/20/2016 Elsevier Interactive Patient Education  2018 [place], Sanda Klein, MD  02/20/2017 5:37 PM    [place]

## 2017-02-22 DIAGNOSIS — J449 Chronic obstructive pulmonary disease, unspecified: Secondary | ICD-10-CM | POA: Diagnosis not present

## 2017-02-24 NOTE — Addendum Note (Signed)
Addended by: Zebedee Iba on: 02/24/2017 10:04 AM   Modules accepted: Orders

## 2017-02-27 DIAGNOSIS — J449 Chronic obstructive pulmonary disease, unspecified: Secondary | ICD-10-CM | POA: Diagnosis not present

## 2017-03-02 ENCOUNTER — Telehealth: Payer: Self-pay | Admitting: Cardiovascular Disease

## 2017-03-02 DIAGNOSIS — L820 Inflamed seborrheic keratosis: Secondary | ICD-10-CM | POA: Diagnosis not present

## 2017-03-02 NOTE — Telephone Encounter (Signed)
New message       Patient calling the office for samples of medication:   1.  What medication and dosage are you requesting samples for? xarelto 15mg   2.  Are you currently out of this medication? Almost out of medication

## 2017-03-02 NOTE — Telephone Encounter (Signed)
Medication Samples have been provided to the patient.  Drug name: Xarelto       Strength: 15 mg         Qty: 2 bottles   LOT: [id number]   Exp.Date: 01/20  Patient notified that samples are available at the front desk.

## 2017-03-04 DIAGNOSIS — I10 Essential (primary) hypertension: Secondary | ICD-10-CM | POA: Diagnosis not present

## 2017-03-04 DIAGNOSIS — E789 Disorder of lipoprotein metabolism, unspecified: Secondary | ICD-10-CM | POA: Diagnosis not present

## 2017-03-06 ENCOUNTER — Ambulatory Visit: Payer: Self-pay | Admitting: Cardiovascular Disease

## 2017-03-09 ENCOUNTER — Telehealth: Payer: Self-pay | Admitting: Neurology

## 2017-03-09 NOTE — Telephone Encounter (Signed)
Pt daughter called back, she stated since on the medication the hallucinations have taken place a few times.  In addition to that is VERY  Tired, daughter very concerned about if this is medication pt needs to come off, please call.  Pt daughter apologizes for missing call earlier but will be on look out for returned call.

## 2017-03-09 NOTE — Telephone Encounter (Signed)
I called the patient and talked with the daughter. The patient has had some problems with hallucinations on Keppra. She has had 2 EEG study that are normal, she was being placed on Keppra on an impaired basis. In the meantime, patient was found to have pauses on the EKG of 3-4 seconds, this likely is the etiology of her syncopal events.  We will stop the Keppra, the family is to contact our office if any questions or issues arise.

## 2017-03-09 NOTE — Telephone Encounter (Signed)
Raven [name] (listed on [place]) called office in reference to patient being on levETIRAcetam (KEPPRA) 250 MG tablet.  [name] says the patient has been having hallucinations with patient calling [name] stating there is a man sitting in her living room once [name] got there no one was there.  [name] does not know if it can be the Keppra, but if it is she would like to stop the medication.  [name] advised Dr. [name] out of the office this week.

## 2017-03-09 NOTE — Addendum Note (Signed)
Addended by: Kathrynn Ducking on: 03/09/2017 06:31 PM   Modules accepted: Orders

## 2017-03-09 NOTE — Telephone Encounter (Signed)
Rn call patients daughter Raven [name] at [phone]. PTs daughter on dpr stated her mom wore a cardiac monitor and the results stated she had some irregular rhythm. Pt is schedule to get a pacemaker in July 2018. PT has been on keppra since April 2018 because they thought she was having seizures. Pt is taking 250mg  one tablet twice a day. Pts daughter states mom is tired and having hallucinations. Her mom thinks decease family members are in her house.Rn stated when a patient has certain heart conditions, a sympt can be fatigue and be tired a lot. Pts daughter wants to know can pt come off keppra, because she thinks all these issues are heart related. The seizure was never confirmed. Rn stated message will be sent to Dr. Jannifer Franklin the work in pm MD.

## 2017-03-09 NOTE — Telephone Encounter (Signed)
LEft vm for patients daughter [name] to call back about keppra side effects for her mom.

## 2017-03-10 ENCOUNTER — Telehealth: Payer: Self-pay | Admitting: Cardiovascular Disease

## 2017-03-10 NOTE — Telephone Encounter (Signed)
FYI Dr. [name].

## 2017-03-10 NOTE — Telephone Encounter (Signed)
New message    Pt verbalized that she is calling to get paper instructions for the cath

## 2017-03-10 NOTE — Telephone Encounter (Signed)
Patient's daughter called stating that she needed an updated letter printed with the correct times for the pacemaker procedure. She stated that she thinks the old one has the wrong time on it and wanted to verify. Will route to the provider's nurse.

## 2017-03-11 DIAGNOSIS — I2510 Atherosclerotic heart disease of native coronary artery without angina pectoris: Secondary | ICD-10-CM | POA: Diagnosis not present

## 2017-03-11 DIAGNOSIS — E789 Disorder of lipoprotein metabolism, unspecified: Secondary | ICD-10-CM | POA: Diagnosis not present

## 2017-03-11 NOTE — Telephone Encounter (Signed)
New letter drafted.  Returned call to [place], patient's daughter. Notified her of procedure date, time, and instructions. [name] verbalized understanding and agreed with plan.  New letter and lab slip will be mailed to patient.

## 2017-03-17 ENCOUNTER — Telehealth: Payer: Self-pay | Admitting: Cardiovascular Disease

## 2017-03-17 NOTE — Telephone Encounter (Signed)
F/u message  Pt daughter call requesting to speak with RN. Pt states a letter was to be mailed to her, but she has not received it. Please call back to discuss

## 2017-03-20 ENCOUNTER — Telehealth: Payer: Self-pay | Admitting: Cardiovascular Disease

## 2017-03-20 NOTE — Telephone Encounter (Signed)
Patient calling the office for samples of medication:   1.  What medication and dosage are you requesting samples for? Xarelto 15 mg  2.  Are you currently out of this medication? Patient has enough to last until Monday

## 2017-03-20 NOTE — Telephone Encounter (Signed)
Samples at the front desk  Raven Gray/daughter notified

## 2017-03-23 DIAGNOSIS — I482 Chronic atrial fibrillation: Secondary | ICD-10-CM | POA: Diagnosis not present

## 2017-03-23 LAB — BASIC METABOLIC PANEL
BUN / CREAT RATIO: 16 (ref 12–28)
BUN: 21 mg/dL (ref 8–27)
CALCIUM: 9.2 mg/dL (ref 8.7–10.3)
CO2: 22 mmol/L (ref 20–29)
CREATININE: 1.32 mg/dL — AB (ref 0.57–1.00)
Chloride: 99 mmol/L (ref 96–106)
GFR calc Af Amer: 42 mL/min/{1.73_m2} — ABNORMAL LOW (ref >59)
GFR calc non Af Amer: 36 mL/min/{1.73_m2} — ABNORMAL LOW (ref >59)
Glucose: 106 mg/dL — ABNORMAL HIGH (ref 65–99)
Potassium: 4.4 mmol/L (ref 3.5–5.2)
Sodium: 138 mmol/L (ref 134–144)

## 2017-03-23 LAB — CBC
HEMOGLOBIN: 13 g/dL (ref 11.1–15.9)
Hematocrit: 40.9 % (ref 34.0–46.6)
MCH: 24.2 pg — ABNORMAL LOW (ref 26.6–33.0)
MCHC: 31.8 g/dL (ref 31.5–35.7)
MCV: 76 fL — AB (ref 79–97)
Platelets: 207 10*3/uL (ref 150–379)
RBC: 5.38 x10E6/uL — AB (ref 3.77–5.28)
RDW: 17 % — ABNORMAL HIGH (ref 12.3–15.4)
WBC: 5.4 10*3/uL (ref 3.4–10.8)

## 2017-03-23 LAB — PROTIME-INR
INR: 1.1 (ref 0.8–1.2)
PROTHROMBIN TIME: 11.9 s (ref 9.1–12.0)

## 2017-03-24 ENCOUNTER — Telehealth: Payer: Self-pay | Admitting: Cardiovascular Disease

## 2017-03-24 DIAGNOSIS — J449 Chronic obstructive pulmonary disease, unspecified: Secondary | ICD-10-CM | POA: Diagnosis not present

## 2017-03-24 NOTE — Telephone Encounter (Signed)
New message      Pt is scheduled for a procedure next Wednesday.  Should she take her medications the night before and day of procedure?

## 2017-03-24 NOTE — Telephone Encounter (Signed)
Daughter had question @ medication to give the morning of implant pacemaker. Daughter is aware to hold xarelto starting Monday per letter  RN informed daughter - morning medication day of procedures except lasix  Do not take xarelto. As directed may eat before 8 am.  daughter verbalized understanding.

## 2017-03-27 NOTE — Telephone Encounter (Signed)
Notes recorded by Diana Eves, CMA on 03/27/2017 at 9:14 AM EDT Called patient, spoke with Raven Gray (daughter - OK per [facility]). Results reviewed. Reminded [name] to hold patient's Xarelto for 48h with last dose being Sunday, 03/29/17. Daughter verbalized understanding and agreed with plan.

## 2017-03-29 DIAGNOSIS — J449 Chronic obstructive pulmonary disease, unspecified: Secondary | ICD-10-CM | POA: Diagnosis not present

## 2017-03-31 ENCOUNTER — Other Ambulatory Visit: Payer: Self-pay | Admitting: Cardiovascular Disease

## 2017-03-31 DIAGNOSIS — I482 Chronic atrial fibrillation, unspecified: Secondary | ICD-10-CM

## 2017-03-31 DIAGNOSIS — R55 Syncope and collapse: Secondary | ICD-10-CM

## 2017-04-01 ENCOUNTER — Encounter (HOSPITAL_COMMUNITY)
Admission: RE | Disposition: A | Discharge status: Home / Self Care | Payer: Self-pay | Source: Ambulatory Visit | Attending: Cardiovascular Disease

## 2017-04-01 ENCOUNTER — Ambulatory Visit (HOSPITAL_COMMUNITY)
Admission: RE | Admit: 2017-04-01 | Discharge: 2017-04-02 | Disposition: A | Discharge status: Home / Self Care | Payer: Medicare Other | Source: Ambulatory Visit | Attending: Cardiovascular Disease | Admitting: Cardiovascular Disease

## 2017-04-01 DIAGNOSIS — I495 Sick sinus syndrome: Secondary | ICD-10-CM

## 2017-04-01 DIAGNOSIS — Z7901 Long term (current) use of anticoagulants: Secondary | ICD-10-CM | POA: Insufficient documentation

## 2017-04-01 DIAGNOSIS — Z8673 Personal history of transient ischemic attack (TIA), and cerebral infarction without residual deficits: Secondary | ICD-10-CM | POA: Insufficient documentation

## 2017-04-01 DIAGNOSIS — I482 Chronic atrial fibrillation, unspecified: Secondary | ICD-10-CM

## 2017-04-01 DIAGNOSIS — J449 Chronic obstructive pulmonary disease, unspecified: Secondary | ICD-10-CM | POA: Diagnosis not present

## 2017-04-01 DIAGNOSIS — E785 Hyperlipidemia, unspecified: Secondary | ICD-10-CM | POA: Diagnosis not present

## 2017-04-01 DIAGNOSIS — Z86718 Personal history of other venous thrombosis and embolism: Secondary | ICD-10-CM | POA: Insufficient documentation

## 2017-04-01 DIAGNOSIS — I10 Essential (primary) hypertension: Secondary | ICD-10-CM | POA: Diagnosis not present

## 2017-04-01 DIAGNOSIS — Z8249 Family history of ischemic heart disease and other diseases of the circulatory system: Secondary | ICD-10-CM | POA: Diagnosis not present

## 2017-04-01 DIAGNOSIS — Z86711 Personal history of pulmonary embolism: Secondary | ICD-10-CM | POA: Diagnosis not present

## 2017-04-01 DIAGNOSIS — R55 Syncope and collapse: Secondary | ICD-10-CM | POA: Diagnosis not present

## 2017-04-01 DIAGNOSIS — Z950 Presence of cardiac pacemaker: Secondary | ICD-10-CM | POA: Diagnosis present

## 2017-04-01 HISTORY — DX: Sick sinus syndrome: I49.5

## 2017-04-01 HISTORY — PX: PACEMAKER IMPLANT: EP1218

## 2017-04-01 LAB — SURGICAL PCR SCREEN
MRSA, PCR: NEGATIVE
Staphylococcus aureus: POSITIVE — AB

## 2017-04-01 SURGERY — PACEMAKER IMPLANT
Anesthesia: LOCAL

## 2017-04-01 MED ORDER — CEFAZOLIN SODIUM-DEXTROSE 2-4 GM/100ML-% IV SOLN
2.0000 g | INTRAVENOUS | Status: AC
  Administered 2017-04-01: 2 g via INTRAVENOUS
  Filled 2017-04-01: qty 100

## 2017-04-01 MED ORDER — ACETAMINOPHEN 325 MG PO TABS: 325.0000 mg | ORAL_TABLET | ORAL | Status: DC | PRN

## 2017-04-01 MED ORDER — MIDAZOLAM HCL 5 MG/5ML IJ SOLN
INTRAMUSCULAR | Status: DC | PRN
  Administered 2017-04-01 (×2): 1 mg via INTRAVENOUS

## 2017-04-01 MED ORDER — SODIUM CHLORIDE 0.9 % IV SOLN
INTRAVENOUS | Status: DC
  Administered 2017-04-01: 14:00:00 via INTRAVENOUS

## 2017-04-01 MED ORDER — ONDANSETRON HCL 4 MG/2ML IJ SOLN: 4.0000 mg | Freq: Four times a day (QID) | INTRAMUSCULAR | Status: DC | PRN

## 2017-04-01 MED ORDER — SODIUM CHLORIDE 0.9% FLUSH: 3.0000 mL | INTRAVENOUS | Status: DC | PRN

## 2017-04-01 MED ORDER — FUROSEMIDE 40 MG PO TABS
40.0000 mg | ORAL_TABLET | Freq: Every morning | ORAL | Status: DC
  Administered 2017-04-02: 40 mg via ORAL
  Filled 2017-04-01: qty 1

## 2017-04-01 MED ORDER — HEPARIN (PORCINE) IN NACL 2-0.9 UNIT/ML-% IJ SOLN
INTRAMUSCULAR | Status: AC | PRN
  Administered 2017-04-01: 500 mL

## 2017-04-01 MED ORDER — SODIUM CHLORIDE 0.9 % IR SOLN
Status: AC
  Filled 2017-04-01: qty 2

## 2017-04-01 MED ORDER — SODIUM CHLORIDE 0.9 % IV SOLN: 250.0000 mL | INTRAVENOUS | Status: DC | PRN

## 2017-04-01 MED ORDER — PRAVASTATIN SODIUM 40 MG PO TABS
40.0000 mg | ORAL_TABLET | Freq: Every morning | ORAL | Status: DC
  Administered 2017-04-02: 40 mg via ORAL
  Filled 2017-04-01: qty 1

## 2017-04-01 MED ORDER — LIDOCAINE HCL (PF) 1 % IJ SOLN
INTRAMUSCULAR | Status: DC | PRN
  Administered 2017-04-01: 40 mL via INTRADERMAL

## 2017-04-01 MED ORDER — VENLAFAXINE HCL ER 75 MG PO CP24
150.0000 mg | ORAL_CAPSULE | Freq: Every day | ORAL | Status: DC
  Administered 2017-04-02: 150 mg via ORAL
  Filled 2017-04-01: qty 2

## 2017-04-01 MED ORDER — SODIUM CHLORIDE 0.9 % IV SOLN: 250.0000 mL | INTRAVENOUS | Status: DC

## 2017-04-01 MED ORDER — MIDAZOLAM HCL 5 MG/5ML IJ SOLN
INTRAMUSCULAR | Status: AC
  Filled 2017-04-01: qty 5

## 2017-04-01 MED ORDER — SODIUM CHLORIDE 0.9% FLUSH
3.0000 mL | Freq: Two times a day (BID) | INTRAVENOUS | Status: DC
  Administered 2017-04-01 – 2017-04-02 (×2): 3 mL via INTRAVENOUS

## 2017-04-01 MED ORDER — METOPROLOL TARTRATE 25 MG PO TABS
25.0000 mg | ORAL_TABLET | Freq: Two times a day (BID) | ORAL | Status: DC
  Administered 2017-04-01 – 2017-04-02 (×2): 25 mg via ORAL
  Filled 2017-04-01 (×2): qty 1

## 2017-04-01 MED ORDER — SODIUM CHLORIDE 0.9% FLUSH: 3.0000 mL | Freq: Two times a day (BID) | INTRAVENOUS | Status: DC

## 2017-04-01 MED ORDER — POTASSIUM CHLORIDE CRYS ER 20 MEQ PO TBCR
40.0000 meq | EXTENDED_RELEASE_TABLET | Freq: Every day | ORAL | Status: DC
  Administered 2017-04-02: 40 meq via ORAL
  Filled 2017-04-01: qty 2

## 2017-04-01 MED ORDER — ZOLPIDEM TARTRATE 5 MG PO TABS
5.0000 mg | ORAL_TABLET | Freq: Every day | ORAL | Status: DC
  Administered 2017-04-01: 5 mg via ORAL
  Filled 2017-04-01: qty 1

## 2017-04-01 MED ORDER — CEFAZOLIN SODIUM-DEXTROSE 2-4 GM/100ML-% IV SOLN
INTRAVENOUS | Status: AC
  Filled 2017-04-01: qty 100

## 2017-04-01 MED ORDER — LIDOCAINE HCL (PF) 1 % IJ SOLN
INTRAMUSCULAR | Status: AC
  Filled 2017-04-01: qty 60

## 2017-04-01 MED ORDER — IOPAMIDOL (ISOVUE-370) INJECTION 76%
INTRAVENOUS | Status: DC | PRN
  Administered 2017-04-01: 15 mL via INTRAVENOUS

## 2017-04-01 MED ORDER — CEFAZOLIN SODIUM-DEXTROSE 1-4 GM/50ML-% IV SOLN
1.0000 g | Freq: Four times a day (QID) | INTRAVENOUS | Status: AC
  Administered 2017-04-01 – 2017-04-02 (×3): 1 g via INTRAVENOUS
  Filled 2017-04-01 (×3): qty 50

## 2017-04-01 MED ORDER — SODIUM CHLORIDE 0.9 % IR SOLN
80.0000 mg | Status: AC
  Administered 2017-04-01: 80 mg

## 2017-04-01 MED ORDER — CHLORHEXIDINE GLUCONATE 4 % EX LIQD
60.0000 mL | Freq: Once | CUTANEOUS | Status: DC
  Filled 2017-04-01: qty 60

## 2017-04-01 MED ORDER — MUPIROCIN 2 % EX OINT
TOPICAL_OINTMENT | CUTANEOUS | Status: AC
  Filled 2017-04-01: qty 22

## 2017-04-01 MED ORDER — ALPRAZOLAM 0.25 MG PO TABS: 0.2500 mg | ORAL_TABLET | Freq: Two times a day (BID) | ORAL | Status: DC | PRN

## 2017-04-01 MED ORDER — FENTANYL CITRATE (PF) 100 MCG/2ML IJ SOLN
INTRAMUSCULAR | Status: AC
  Filled 2017-04-01: qty 2

## 2017-04-01 MED ORDER — FENTANYL CITRATE (PF) 100 MCG/2ML IJ SOLN
INTRAMUSCULAR | Status: DC | PRN
  Administered 2017-04-01: 25 ug via INTRAVENOUS

## 2017-04-01 MED ORDER — HEPARIN (PORCINE) IN NACL 2-0.9 UNIT/ML-% IJ SOLN
INTRAMUSCULAR | Status: AC
  Filled 2017-04-01: qty 500

## 2017-04-01 MED ORDER — MUPIROCIN 2 % EX OINT
TOPICAL_OINTMENT | Freq: Once | CUTANEOUS | Status: AC
  Administered 2017-04-01: 1 via NASAL
  Filled 2017-04-01: qty 22

## 2017-04-01 MED ORDER — ACETAMINOPHEN 325 MG PO TABS: 650.0000 mg | ORAL_TABLET | Freq: Four times a day (QID) | ORAL | Status: DC | PRN

## 2017-04-01 SURGICAL SUPPLY — 10 items
CABLE SURGICAL S-101-97-12 (CABLE) ×1 IMPLANT
COVER PRB 48X5XTLSCP FOLD TPE (BAG) IMPLANT
COVER PROBE 5X48 (BAG) ×2
IPG PACE AZUR XT SR MRI W1SR01 (Pacemaker) IMPLANT
LEAD CAPSURE NOVUS 5076-52CM (Lead) ×1 IMPLANT
PACE AZURE XT SR MRI W1SR01 (Pacemaker) ×2 IMPLANT
PAD DEFIB LIFELINK (PAD) ×1 IMPLANT
SHEATH CLASSIC 7F (SHEATH) ×1 IMPLANT
TRAY PACEMAKER INSERTION (PACKS) ×1 IMPLANT
WIRE HI TORQ VERSACORE-J 145CM (WIRE) ×1 IMPLANT

## 2017-04-01 NOTE — H&P (Signed)
Chief Complaint:  Syncope  HPI:  This is a 83 y.o. female with a past medical history significant for paroxysmal Atrial fibrillation, remote DVT/pulmonary embolism on chronic Xarelto anticoagulation, hypertension, hyperlipidemia, COPD with recent complaints of weakness and sluggishness. She has had at least one episode of syncope. Echocardiogram and nuclear stress test did not show evidence of left ventricular dysfunction or significant coronary insufficiency. She complains of exertional dyspnea. Her event monitor has shown atrial fibrillation with slow ventricular response and pauses of 3-4 seconds. She is on a very low dose of beta blocker which has been necessary for rate control. She is here for pacemaker implantation for tachycardia-bradycardia syndrome, referred by Dr. Quay Burow, M.D.  PMHx:  Past Medical History:  Diagnosis Date  . Atrial fibrillation ([place])    a. on Xarelto  . Chest pain   . COPD (chronic obstructive pulmonary disease) ([place])    family unsure if is copd,   . H/O echocardiogram    a. 08/2016: EF of 60-65%, severely dilated LA, mild pulmonic regurgitations, mild TR, and PA Pressure at 38 mm Hg  . History of depression   . History of DVT (deep vein thrombosis)   . History of pulmonary embolism   . Hyperlipidemia   . Hypertension   . Hypokalemia    Now improved with treatment  . Shortness of breath   . Stroke [facility])     Past Surgical History:  Procedure Laterality Date  . REPLACEMENT TOTAL KNEE    . ROTATOR CUFF REPAIR      FAMHx:  Family History  Problem Relation Age of Onset  . Alzheimer's disease Mother   . Heart attack Father   . Diabetes type II Daughter   . Heart disease Daughter        stents    SOCHx:   reports that she has never smoked. She has never used smokeless tobacco. She reports that she does not drink alcohol or use drugs.  ALLERGIES:  No Known Allergies  ROS: Pertinent items noted in HPI and remainder of comprehensive ROS  otherwise negative.  HOME MEDS: Medications Prior to Admission  Medication Sig Dispense Refill  . acetaminophen (TYLENOL) 325 MG tablet Take 650 mg by mouth every 6 (six) hours as needed (for pain).     Marland Kitchen ALPRAZolam (XANAX) 0.25 MG tablet Take 1 tablet (0.25 mg total) by mouth 2 (two) times daily as needed for anxiety. 30 tablet 0  . furosemide (LASIX) 40 MG tablet Take 1 tablet (40 mg total) by mouth every morning.    . metoprolol (LOPRESSOR) 25 MG tablet Take 1 tablet (25 mg total) by mouth 2 (two) times daily. 180 tablet 3  . Multiple Vitamins-Minerals (WOMENS 50+ MULTI VITAMIN/MIN) TABS Take 1 tablet by mouth daily.    . nitroGLYCERIN (NITROSTAT) 0.4 MG SL tablet Place 0.4 mg under the tongue as needed for chest pain. X 3 doses  0  . omeprazole (PRILOSEC) 20 MG capsule Take 20 mg by mouth daily as needed (for reflux/heartburn).   11  . OXYGEN Inhale 2 L into the lungs continuous.     . potassium chloride SA (K-DUR,KLOR-CON) 20 MEQ tablet Take 40 mEq by mouth daily. Take 2 tablets every morning 90 tablet 3  . pravastatin (PRAVACHOL) 40 MG tablet Take 40 mg by mouth every morning.     . Rivaroxaban (XARELTO) 15 MG TABS tablet Take 1 tablet (15 mg total) by mouth every morning. 14 tablet 0  . venlafaxine XR (  EFFEXOR-XR) 150 MG 24 hr capsule Take 150 mg by mouth daily.    Marland Kitchen zolpidem (AMBIEN) 5 MG tablet Take 5 mg by mouth at bedtime.    . isosorbide mononitrate (IMDUR) 60 MG 24 hr tablet TAKE 1 TABLET EVERY DAY (Patient not taking: Reported on 03/26/2017) 90 tablet 1    LABS/IMAGING: No results found for this or any previous visit (from the past 48 hour(s)). No results found.  VITALS: Blood pressure 124/84, pulse (!) 56, resp. rate 16, height 5\' 3"  (1.6 m), weight 182 lb (82.6 kg), SpO2 98 %.  EXAM:  General: Alert, oriented x3, no distress Head: no evidence of trauma, PERRL, EOMI, no exophtalmos or lid lag, no myxedema, no xanthelasma; normal ears, nose and oropharynx Neck: normal  jugular venous pulsations and no hepatojugular reflux; brisk carotid pulses without delay and no carotid bruits Chest: clear to auscultation, no signs of consolidation by percussion or palpation, normal fremitus, symmetrical and full respiratory excursions Cardiovascular: normal position and quality of the apical impulse, regular rhythm, normal first heart sound and normal second heart sounds, no rubs or gallops, early peaking 2/6 systolic ejection murmurin the aortic focus Abdomen: no tenderness or distention, no masses by palpation, no abnormal pulsatility or arterial bruits, normal bowel sounds, no hepatosplenomegaly Extremities: no clubbing, cyanosis or edema; 2+ radial, ulnar and brachial pulses bilaterally; 2+ right femoral, posterior tibial and dorsalis pedis pulses; 2+ left femoral, posterior tibial and dorsalis pedis pulses; no subclavian or femoral bruits Neurological: grossly nonfocal   IMPRESSION: Tachycardia-bradycardia syndrome with both paroxysmal atrial fibrillation rapid ventricular response and symptomatic sinus pauses with at least one episode of syncope.  PLAN: She'll benefit from implantation of dual-chamber permanent pacemaker. This procedure has been fully reviewed with the patient and written informed consent has been obtained.   Sanda Klein, MD, [facility] [phone] office [phone] pager  04/01/2017, 1:41 PM

## 2017-04-01 NOTE — Discharge Instructions (Signed)

## 2017-04-01 NOTE — Op Note (Signed)
Procedure report  Procedure performed:  1. Implantation of new single chamber permanent pacemaker 2. Fluoroscopy 3. Left upper extremity venography and venous ultrasound 4. Light sedation   Reason for procedure: Syncope Atrial fibrillation with slow ventricular response  Procedure performed by: Sanda Klein, MD  Complications: None  Estimated blood loss: <10 mL  Medications administered during procedure: Ancef 2 g intravenously Lidocaine 1% 30 mL locally,  Fentanyl 25 mcg intravenously Versed 2 mg intravenously Isovue 10 mL IV  Device details: Generator Medtronic Azure XT SR MRI, model T3907887 serial number [id number] H Right ventricular lead Medtronic [id number] serial number [id number]  Procedure details:  After the risks and benefits of the procedure were discussed the patient provided informed consent and was brought to the cardiac cath lab in the fasting state. The patient was prepped and draped in usual sterile fashion. Local anesthesia with 1% lidocaine was administered to to the left infraclavicular area. A 5-6 cm horizontal incision was made parallel with and 2-3 cm caudal to the left clavicle. Using electrocautery and blunt dissection a prepectoral pocket was created down to the level of the pectoralis major muscle fascia. The pocket was carefully inspected for hemostasis. An antibiotic-soaked sponge was placed in the pocket.  Under fluoroscopic guidance and using the modified Seldinger technique a single venipuncture was performed to access the left subclavian vein. Considerable difficulty was encountered accessing the vein. A sterile ultrasound probe easily identified the course of the subclavian artery, but the subclavian vein was not seen. Venography showed unusual anatomy, with no visible axillary vein. The cephalic and basilic veins appeared to fuse behind the medial third of the left clavicle. A very medial venipuncture was necessary. A Wholey guidewire was  subsequently exchanged for a 7 [place] safe sheath.  Under fluoroscopic guidance the ventricular lead was advanced to the right ventricle. Multiple locations were mapped with mediocre R waves around 6 mV. The best location was at the level of the mid to apical right ventricular septum and the active-fixation helix was deployed. Prominent current of injury was seen. Satisfactory pacing and sensing parameters were recorded. There was no evidence of diaphragmatic stimulation at maximum device output. The safe sheath was peeled away and the lead was secured in place with 2-0 silk.  The antibiotic-soaked sponge was removed from the pocket. The pocket was flushed with copious amounts of antibiotic solution. Reinspection showed excellent hemostasis..  The ventricular lead was connected to the generator and appropriate ventricular pacing was seen.   The entire system was then carefully inserted in the pocket with care been taking that the lead and device assumed a comfortable position without pressure on the incision. Great care was taken that the leads be located deep to the generator. The pocket was then closed in layers using 2 layers of 2-0 Vicryl and cutaneous staples, after which a sterile dressing was applied.  At the end of the procedure the following lead parameters were encountered: Right ventricular lead sensed R waves 5.4 mV, impedance 760 ohms, threshold 1.0 V at 0.4 ms pulse width.  Sanda Klein, MD, [facility] [phone] office [phone] pager

## 2017-04-01 NOTE — Progress Notes (Signed)
Informed MD (via cath lab nurse) that H & P needs updating.  Also noted for MD to see that pt was instructed to stop eating after breakfast (as stated by her daughter), which she ate at 0800, and was able to have ice chips until she arrived.

## 2017-04-02 ENCOUNTER — Ambulatory Visit (HOSPITAL_COMMUNITY): Payer: Medicare Other

## 2017-04-02 ENCOUNTER — Encounter (HOSPITAL_COMMUNITY): Payer: Self-pay | Admitting: Cardiovascular Disease

## 2017-04-02 DIAGNOSIS — I482 Chronic atrial fibrillation: Secondary | ICD-10-CM | POA: Diagnosis not present

## 2017-04-02 DIAGNOSIS — Z8249 Family history of ischemic heart disease and other diseases of the circulatory system: Secondary | ICD-10-CM | POA: Diagnosis not present

## 2017-04-02 DIAGNOSIS — Z86711 Personal history of pulmonary embolism: Secondary | ICD-10-CM | POA: Diagnosis not present

## 2017-04-02 DIAGNOSIS — J9811 Atelectasis: Secondary | ICD-10-CM | POA: Diagnosis not present

## 2017-04-02 DIAGNOSIS — Z86718 Personal history of other venous thrombosis and embolism: Secondary | ICD-10-CM | POA: Diagnosis not present

## 2017-04-02 DIAGNOSIS — I10 Essential (primary) hypertension: Secondary | ICD-10-CM | POA: Diagnosis not present

## 2017-04-02 DIAGNOSIS — I495 Sick sinus syndrome: Secondary | ICD-10-CM | POA: Diagnosis not present

## 2017-04-02 DIAGNOSIS — J449 Chronic obstructive pulmonary disease, unspecified: Secondary | ICD-10-CM | POA: Diagnosis not present

## 2017-04-02 DIAGNOSIS — Z8673 Personal history of transient ischemic attack (TIA), and cerebral infarction without residual deficits: Secondary | ICD-10-CM | POA: Diagnosis not present

## 2017-04-02 DIAGNOSIS — Z7901 Long term (current) use of anticoagulants: Secondary | ICD-10-CM | POA: Diagnosis not present

## 2017-04-02 DIAGNOSIS — E785 Hyperlipidemia, unspecified: Secondary | ICD-10-CM | POA: Diagnosis not present

## 2017-04-02 MED ORDER — MUPIROCIN 2 % EX OINT: 1.0000 "application " | TOPICAL_OINTMENT | Freq: Two times a day (BID) | CUTANEOUS | Status: DC

## 2017-04-02 MED ORDER — CHLORHEXIDINE GLUCONATE CLOTH 2 % EX PADS: 6.0000 | MEDICATED_PAD | Freq: Every day | CUTANEOUS | Status: DC

## 2017-04-02 MED FILL — Sodium Chloride Irrigation Soln 0.9%: Qty: 500 | Status: AC

## 2017-04-02 MED FILL — Gentamicin Sulfate Inj 40 MG/ML: INTRAMUSCULAR | Qty: 2 | Status: AC

## 2017-04-02 NOTE — Progress Notes (Signed)
Pt family at bedside states unaware on how to operate monitoring device. Called cath lab and spoke to [place]. He stated a RN will come to bedside to explain.   Raven Gray

## 2017-04-02 NOTE — Discharge Summary (Signed)
Discharge Summary    Patient ID: Raven Gray,  MRN: [id number], DOB/AGE: 81-Sep-1929 81 y.o.  Admit date: 04/01/2017 Discharge date: 04/02/2017  Primary Care Provider: Anda Gray Primary Cardiologist: Raven Raven Gray, Raven Gray  Discharge Diagnoses    Principal Problem:   Tachycardia-bradycardia syndrome [facility]) Active Problems:   Syncope and collapse   Chronic atrial fibrillation [facility])   Pacemaker   Allergies No Known Allergies  Diagnostic Studies/Procedures    PACEMAKER IMPLANTATION 03/22/2017 Conclusion  1. Implantation of new single chamber permanent pacemaker 2. Fluoroscopy 3. Left upper extremity venography and venous ultrasound 4. Light sedation  Device details: Generator Medtronic Azure XT SR MRI, model [id number] serial number [id number] H Right ventricular lead Medtronic [id number] serial number [id number]  _____________   History of Present Illness     83 y.o. female with a past medical history significant for paroxysmal Atrial fibrillation, remote DVT/pulmonary embolism on chronic Xarelto anticoagulation, hypertension, hyperlipidemia, COPD with recent complaints of weakness and sluggishness. She has had at least one episode of syncope. Echocardiogram and nuclear stress test did not show evidence of left ventricular dysfunction or significant coronary insufficiency. She complains of exertional dyspnea. Her event monitor has shown atrial fibrillation with slow ventricular response and pauses of 3-4 seconds. She is on a very low dose of beta blocker which has been necessary for rate control. She was referred by Raven. Quay Gray, M.D to Raven Raven Gray for pacemaker implantation for tachycardia-bradycardia syndrome.   Hospital Course     Consultants: none   Raven Gray had a Medtronicsingle lead device inserted and tolerated the procedure well.   On 07/12, she was seen by Raven Raven Gray and all data were reviewed.   Pacemaker check shows good impedance ( 742 ohm) and capture  threshold (0.75V@0 .74ms), but mediocre sensing (3.5 mV bipolar, 6.6 mV unipolar).  Her CXR showed good lead position and no PTX or other acute disease.  Her site was without hematoma.   No further inpatient workup was indicated and she is considered stable for discharge, to follow up as an outpatient. _____________  Discharge Vitals Blood pressure 130/62, pulse 70, temperature 98 F (36.7 C), temperature source Oral, resp. rate 17, height 5\' 3"  (1.6 m), weight 180 lb (81.6 kg), SpO2 98 %.  Filed Weights   04/01/17 1340 04/02/17 0200  Weight: 182 lb (82.6 kg) 180 lb (81.6 kg)    Labs & Radiologic Studies    No labs this admission. _____________  Dg Chest 2 View Result Date: 04/02/2017 CLINICAL DATA:  Pacemaker placement EXAM: CHEST  2 VIEW COMPARISON:  01/08/2017 chest radiograph. FINDINGS: Single lead left subclavian pacer is noted with lead tip overlying the right ventricular apex. Skin staples overlie the pacemaker device in the left chest. Stable cardiomediastinal silhouette with mild cardiomegaly, aortic atherosclerosis and hiatal hernia. No pneumothorax. No pleural effusion. Cephalization of the pulmonary vasculature without overt pulmonary edema. Mild bibasilar scarring versus atelectasis. IMPRESSION: 1. Well-positioned single lead left subclavian pacemaker. No pneumothorax. 2. Stable mild cardiomegaly without overt pulmonary edema. 3. Mild bibasilar scarring versus atelectasis. 4. Stable moderate to large hiatal hernia. Electronically Signed   By: Ilona Sorrel M.D.   On: 04/02/2017 07:57   Disposition   Pt is being discharged home today in good condition.  Follow-up Plans & Appointments    Follow-up Information    Raven Kraft, MD. Go on 04/07/2017.   Specialty:  Endocrinology Why:  @ 2:45pm Contact information: [address] [place]  [address] [phone]  [facility] Follow up on 04/15/2017.     Why:  Device clinic appt for wound check and device check. Please arrive at 2:15 pm for a 2:30 pm appt. Contact information: [place] [address] [phone]       Raven Klein, MD Follow up on 07/20/2017.   Specialty:  Cardiology Why:  Please arrive at 9:45 am for 10:00 am appt. Contact information: [address] [place] [address] [phone]          Discharge Instructions    Call MD for:  redness, tenderness, or signs of infection (pain, swelling, redness, odor or green/yellow discharge around incision site)    Complete by:  As directed    Call MD for:  temperature >100.4    Complete by:  As directed    Diet - low sodium heart healthy    Complete by:  As directed    Increase activity slowly    Complete by:  As directed       Discharge Medications   Current Discharge Medication List    CONTINUE these medications which have NOT CHANGED   Details  acetaminophen (TYLENOL) 325 MG tablet Take 650 mg by mouth every 6 (six) hours as needed (for pain).     ALPRAZolam (XANAX) 0.25 MG tablet Take 1 tablet (0.25 mg total) by mouth 2 (two) times daily as needed for anxiety. Qty: 30 tablet, Refills: 0    furosemide (LASIX) 40 MG tablet Take 1 tablet (40 mg total) by mouth every morning.    metoprolol (LOPRESSOR) 25 MG tablet Take 1 tablet (25 mg total) by mouth 2 (two) times daily. Qty: 180 tablet, Refills: 3    Multiple Vitamins-Minerals (WOMENS 50+ MULTI VITAMIN/MIN) TABS Take 1 tablet by mouth daily.    nitroGLYCERIN (NITROSTAT) 0.4 MG SL tablet Place 0.4 mg under the tongue as needed for chest pain. X 3 doses Refills: 0    OXYGEN Inhale 2 L into the lungs continuous.     potassium chloride SA (K-DUR,KLOR-CON) 20 MEQ tablet Take 40 mEq by mouth daily. Take 2 tablets every morning Qty: 90 tablet, Refills: 3    pravastatin (PRAVACHOL) 40 MG tablet Take 40 mg by mouth every morning.     Rivaroxaban  (XARELTO) 15 MG TABS tablet Take 1 tablet (15 mg total) by mouth every morning. Qty: 14 tablet, Refills: 0    venlafaxine XR (EFFEXOR-XR) 150 MG 24 hr capsule Take 150 mg by mouth daily.    zolpidem (AMBIEN) 5 MG tablet Take 5 mg by mouth at bedtime.    isosorbide mononitrate (IMDUR) 60 MG 24 hr tablet TAKE 1 TABLET EVERY DAY Qty: 90 tablet, Refills: 1    omeprazole (PRILOSEC) 20 MG capsule Take 20 mg by mouth daily as needed (for reflux/heartburn).  Refills: 11          Outstanding Labs/Studies   NONE  Duration of Discharge Encounter   Greater than 30 minutes including physician time.  SignedRosaria Ferries NP 04/02/2017, 10:20 AM

## 2017-04-02 NOTE — Progress Notes (Signed)
Pt taking off sling this morning despite RN replacing equipment and education. Will continue to monitor.

## 2017-04-02 NOTE — Progress Notes (Signed)
   Progress Note  Patient Name: Raven Gray Date of Encounter: 04/02/2017  Primary Cardiologist: Hilty/Geddy [place]un  Subjective   Slight soreness at surgical site.  Inpatient Medications    Scheduled Meds: . Chlorhexidine Gluconate Cloth  6 each Topical Daily  . furosemide  40 mg Oral q morning - 10a  . metoprolol tartrate  25 mg Oral BID  . mupirocin ointment  1 application Nasal BID  . potassium chloride SA  40 mEq Oral Daily  . pravastatin  40 mg Oral q morning - 10a  . sodium chloride flush  3 mL Intravenous Q12H  . venlafaxine XR  150 mg Oral Daily  . zolpidem  5 mg Oral QHS   Continuous Infusions: . sodium chloride    .  ceFAZolin (ANCEF) IV 1 g (04/02/17 0547)   PRN Meds: sodium chloride, acetaminophen, ALPRAZolam, ondansetron (ZOFRAN) IV, sodium chloride flush   Vital Signs    Vitals:   04/01/17 2146 04/02/17 0035 04/02/17 0200 04/02/17 0533  BP: (!) 156/67 139/61  130/62  Pulse: 60 70  70  Resp:  17  17  Temp:  97.9 F (36.6 C)  98 F (36.7 C)  TempSrc:  Oral  Oral  SpO2:  97%  98%  Weight:   180 lb (81.6 kg)   Height:        Intake/Output Summary (Last 24 hours) at 04/02/17 0731 Last data filed at 04/02/17 0533  Gross per 24 hour  Intake              650 ml  Output              750 ml  Net             -100 ml   Filed Weights   04/01/17 1340 04/02/17 0200  Weight: 182 lb (82.6 kg) 180 lb (81.6 kg)    Telemetry    atrial fibrillation with intermittent V pacing - Personally Reviewed  ECG    Not done yet - Personally Reviewed  Physical Exam  Comfortable smiling. Wearing sling GEN: No acute distress.   Neck: No JVD Cardiac: irregular, no murmurs, rubs, or gallops.  Respiratory: Clear to auscultation bilaterally. GI: Soft, nontender, non-distended  MS: No edema; No deformity. Neuro:  Nonfocal  Psych: Normal affect   Pacemaker site with very slight blood staining, no hematoma.   Radiology    No results found.   On my review, lead  position is appropriate and no pneumothorax  Cardiac Studies   Pacemaker check shows good impedance ( 742 ohm) and capture threshold (0.75V@0 .37ms), but mediocre sensing (3.5 mV bipolar, 6.6 mV unipolar).  Patient Profile     83 y.o. female with syncope related to pauses during atrial fibrillation with slow response, s/p single chamber pacemaker, without immediate complications.  Assessment & Plan    DC home. Wound check 7-10 days. Office pacemaker check with me 3 months. Restart Xarelto tonight. Wound care and activity instructions given.  Signed, Sanda Klein, MD  04/02/2017, 7:31 AM

## 2017-04-02 NOTE — Progress Notes (Signed)
Pt discharged via volunteer services. Representative from [employer] came to speak to family Family understands use of device  Pt has all belongings  Nakeia Calvi

## 2017-04-02 NOTE — Progress Notes (Signed)
Pt IV discounted, catheter intact and telemetry removed. Pt has all belongings pack. Discharge education provided at bedside. Awaiting pt family for discharge transportation   [place]

## 2017-04-02 NOTE — Progress Notes (Signed)
Cardiology called and stated to inform pt it is okay to receive tonight's dose of xarelto and to inform pt of follow up appointments made  Pt aware  Sister Carbone

## 2017-04-07 ENCOUNTER — Telehealth: Payer: Self-pay | Admitting: Student

## 2017-04-07 DIAGNOSIS — Z09 Encounter for follow-up examination after completed treatment for conditions other than malignant neoplasm: Secondary | ICD-10-CM | POA: Diagnosis not present

## 2017-04-07 NOTE — Telephone Encounter (Signed)
Patient's daughter called reporting she has experienced pain along her PPM site starting this AM. No drainage or discoloration. She denies any fever or chills. Just describes it as a "shooting pain" along the site. Was evaluated by her PCP earlier and informed everything "looked good".  I recommended she try taking Tylenol 500mg  Q6H for the pain and following her discharge instructions regarding arm movements. Spoke with Dr. Sallyanne Kuster. Will try to get the patient into the device clinic later this week (sent staff message to the office).   Signed, Erma Heritage, PA-C 04/07/2017, 6:02 PM Pager: [phone]

## 2017-04-08 ENCOUNTER — Telehealth: Payer: Self-pay | Admitting: *Deleted

## 2017-04-08 NOTE — Telephone Encounter (Signed)
-----   Message from Erma Heritage, [place] sent at 04/07/2017  6:04 PM EDT ----- Regarding: Appointment Hi,   This patient's daughter called the answering service this evening reporting pain along her site that started today. I spoke with Dr. Loletha Grayer and he recommended she come in for her device check this week if at all possible. If able to do so, please call her daughter, [name], at [phone] to get this scheduled.   Thanks for your help!  Declo,  [place]

## 2017-04-08 NOTE — Telephone Encounter (Signed)
Appt scheduled for 7/19 @ 1330. [name] aware.

## 2017-04-09 ENCOUNTER — Ambulatory Visit (INDEPENDENT_AMBULATORY_CARE_PROVIDER_SITE_OTHER): Payer: Medicare Other | Admitting: *Deleted

## 2017-04-09 ENCOUNTER — Telehealth: Payer: Self-pay | Admitting: Cardiovascular Disease

## 2017-04-09 DIAGNOSIS — I482 Chronic atrial fibrillation, unspecified: Secondary | ICD-10-CM

## 2017-04-09 NOTE — Telephone Encounter (Signed)
Tobin Chad, RN in device check, came and got samples for the pt. I gave 2 bottles of Xarelto 15 mg tablets.

## 2017-04-10 LAB — CUP PACEART INCLINIC DEVICE CHECK
Battery Voltage: 3.21 V
Brady Statistic RV Percent Paced: 28.63 %
Date Time Interrogation Session: 20180719175530
Implantable Lead Implant Date: 20180711
Implantable Pulse Generator Implant Date: 20180711
Lead Channel Impedance Value: 418 Ohm
Lead Channel Pacing Threshold Amplitude: 1 V
Lead Channel Sensing Intrinsic Amplitude: 3.875 mV
Lead Channel Setting Pacing Amplitude: 3.5 V
Lead Channel Setting Pacing Pulse Width: 0.4 ms
MDC IDC LEAD LOCATION: 753860
MDC IDC MSMT BATTERY REMAINING LONGEVITY: 180 mo
MDC IDC MSMT LEADCHNL RV IMPEDANCE VALUE: 513 Ohm
MDC IDC MSMT LEADCHNL RV PACING THRESHOLD PULSEWIDTH: 0.4 ms
MDC IDC MSMT LEADCHNL RV SENSING INTR AMPL: 3 mV
MDC IDC SET LEADCHNL RV SENSING SENSITIVITY: 0.9 mV

## 2017-04-10 NOTE — Progress Notes (Signed)
Wound check appointment. Staples removed. Wound without redness or edema. Incision edges approximated, wound well healed. Patient states that she experienced a lot of pain at her ppm site on 7/17, but she said that the pain was relieved ES Acetaminophen 1000mg  and has improved overall since implant.   Normal device function. Threshold, sensing, and impedance consistent with implant measurements. Device programmed at 3.5V for extra safety margin until 3 month visit. Histogram distribution appropriate for patient and level of activity. No high ventricular rates noted. Patient educated about wound care, arm mobility, lifting restrictions. ROV in 3 months with [facility].

## 2017-04-15 ENCOUNTER — Ambulatory Visit: Payer: Self-pay

## 2017-04-16 ENCOUNTER — Ambulatory Visit: Payer: Self-pay

## 2017-04-22 ENCOUNTER — Telehealth: Payer: Self-pay | Admitting: Cardiovascular Disease

## 2017-04-22 NOTE — Telephone Encounter (Signed)
Pt's dtr calling to see if we have samples of Xarelto 15mg 

## 2017-04-22 NOTE — Telephone Encounter (Signed)
Medication samples have been provided to the patient. Drug name: Xarelto 15 mg Qty: 28 tabs LOT: [id number] Exp.Date: 08/2019  Samples left at front desk for patient pick-up. Patient's daughter  notified.  Inaya Gillham, [place] 2:25 PM 04/22/2017

## 2017-04-24 DIAGNOSIS — J449 Chronic obstructive pulmonary disease, unspecified: Secondary | ICD-10-CM | POA: Diagnosis not present

## 2017-04-29 DIAGNOSIS — J449 Chronic obstructive pulmonary disease, unspecified: Secondary | ICD-10-CM | POA: Diagnosis not present

## 2017-05-19 ENCOUNTER — Ambulatory Visit: Payer: Self-pay | Admitting: Nurse Practitioner

## 2017-05-21 ENCOUNTER — Encounter: Payer: Self-pay | Admitting: Internal Medicine

## 2017-05-21 DIAGNOSIS — I10 Essential (primary) hypertension: Secondary | ICD-10-CM | POA: Diagnosis not present

## 2017-05-25 DIAGNOSIS — J449 Chronic obstructive pulmonary disease, unspecified: Secondary | ICD-10-CM | POA: Diagnosis not present

## 2017-05-26 ENCOUNTER — Telehealth: Payer: Self-pay | Admitting: Cardiovascular Disease

## 2017-05-26 MED ORDER — RIVAROXABAN 15 MG PO TABS: 15.0000 mg | ORAL_TABLET | Freq: Every morning | ORAL | 0 refills | Status: DC

## 2017-05-26 NOTE — Telephone Encounter (Signed)
Left message on patients voicemail advising her to contact office at her earliest convenience.

## 2017-05-26 NOTE — Telephone Encounter (Signed)
New message    Patient calling the office for samples of medication:   1.  What medication and dosage are you requesting samples for?Rivaroxaban (XARELTO) 15 MG TABS tablet  2.  Are you currently out of this medication? Pt has enough to last until end of the month

## 2017-05-26 NOTE — Telephone Encounter (Signed)
Patients daughter Apolonio Schneiders called and wanted to know if we had samples of Xarelto 15mg . I told her we had samples and I would place them upfront for pickup. Daughter voiced understanding.

## 2017-05-30 DIAGNOSIS — J449 Chronic obstructive pulmonary disease, unspecified: Secondary | ICD-10-CM | POA: Diagnosis not present

## 2017-06-01 ENCOUNTER — Telehealth (INDEPENDENT_AMBULATORY_CARE_PROVIDER_SITE_OTHER): Payer: Self-pay | Admitting: Orthopaedic Surgery

## 2017-06-01 NOTE — Telephone Encounter (Signed)
I called , discussed. Use tylenol, she has recent pacemaker, on eliquis, a-fib , altered consciousness in May, depression with 2 meds for depression,  can't take NSAIDs, daughter can discuss with LMD.  [name]

## 2017-06-01 NOTE — Telephone Encounter (Signed)
noted 

## 2017-06-01 NOTE — Telephone Encounter (Signed)
Patient's daughter [name]) called advised patient is having pain in her back. She asked if Dr Lorin Mercy can prescribe her something for pain. The  Number to contact [name] is [phone]

## 2017-06-01 NOTE — Telephone Encounter (Signed)
Please advise 

## 2017-06-05 DIAGNOSIS — I10 Essential (primary) hypertension: Secondary | ICD-10-CM | POA: Diagnosis not present

## 2017-06-05 DIAGNOSIS — E789 Disorder of lipoprotein metabolism, unspecified: Secondary | ICD-10-CM | POA: Diagnosis not present

## 2017-06-12 ENCOUNTER — Other Ambulatory Visit: Payer: Self-pay | Admitting: Internal Medicine

## 2017-06-12 DIAGNOSIS — Z23 Encounter for immunization: Secondary | ICD-10-CM | POA: Diagnosis not present

## 2017-06-12 DIAGNOSIS — I10 Essential (primary) hypertension: Secondary | ICD-10-CM | POA: Diagnosis not present

## 2017-06-12 DIAGNOSIS — Z79899 Other long term (current) drug therapy: Secondary | ICD-10-CM | POA: Diagnosis not present

## 2017-06-12 DIAGNOSIS — E118 Type 2 diabetes mellitus with unspecified complications: Secondary | ICD-10-CM | POA: Diagnosis not present

## 2017-06-22 ENCOUNTER — Telehealth: Payer: Self-pay | Admitting: Cardiovascular Disease

## 2017-06-22 NOTE — Telephone Encounter (Signed)
Samples at the front desk 

## 2017-06-22 NOTE — Telephone Encounter (Signed)
Raven Gray notified samples are ready for pick up

## 2017-06-22 NOTE — Telephone Encounter (Signed)
New message     Patient calling the office for samples of medication:   1.  What medication and dosage are you requesting samples for? Rivaroxaban (XARELTO) 15 MG TABS tablet Take 1 tablet (15 mg total) by mouth every morning.  2.  Are you currently out of this medication?  YES  Patient daughter [name] calling to request samples.

## 2017-06-24 DIAGNOSIS — J449 Chronic obstructive pulmonary disease, unspecified: Secondary | ICD-10-CM | POA: Diagnosis not present

## 2017-06-29 DIAGNOSIS — J449 Chronic obstructive pulmonary disease, unspecified: Secondary | ICD-10-CM | POA: Diagnosis not present

## 2017-07-01 ENCOUNTER — Encounter (HOSPITAL_COMMUNITY): Payer: Self-pay

## 2017-07-01 ENCOUNTER — Inpatient Hospital Stay (HOSPITAL_COMMUNITY): Payer: Medicare Other

## 2017-07-01 ENCOUNTER — Emergency Department (HOSPITAL_COMMUNITY): Payer: Medicare Other

## 2017-07-01 ENCOUNTER — Inpatient Hospital Stay (HOSPITAL_COMMUNITY)
Admission: EM | Admit: 2017-07-01 | Discharge: 2017-07-05 | DRG: 871 | Disposition: A | Discharge status: Home Health | Payer: Medicare Other | Attending: Internal Medicine | Admitting: Internal Medicine

## 2017-07-01 DIAGNOSIS — Z86718 Personal history of other venous thrombosis and embolism: Secondary | ICD-10-CM | POA: Diagnosis not present

## 2017-07-01 DIAGNOSIS — Z9981 Dependence on supplemental oxygen: Secondary | ICD-10-CM | POA: Diagnosis not present

## 2017-07-01 DIAGNOSIS — Z86711 Personal history of pulmonary embolism: Secondary | ICD-10-CM

## 2017-07-01 DIAGNOSIS — I110 Hypertensive heart disease with heart failure: Secondary | ICD-10-CM | POA: Diagnosis not present

## 2017-07-01 DIAGNOSIS — J449 Chronic obstructive pulmonary disease, unspecified: Secondary | ICD-10-CM | POA: Diagnosis present

## 2017-07-01 DIAGNOSIS — J810 Acute pulmonary edema: Secondary | ICD-10-CM | POA: Diagnosis not present

## 2017-07-01 DIAGNOSIS — Z66 Do not resuscitate: Secondary | ICD-10-CM | POA: Diagnosis present

## 2017-07-01 DIAGNOSIS — J9690 Respiratory failure, unspecified, unspecified whether with hypoxia or hypercapnia: Secondary | ICD-10-CM

## 2017-07-01 DIAGNOSIS — J441 Chronic obstructive pulmonary disease with (acute) exacerbation: Secondary | ICD-10-CM

## 2017-07-01 DIAGNOSIS — J9601 Acute respiratory failure with hypoxia: Secondary | ICD-10-CM | POA: Diagnosis not present

## 2017-07-01 DIAGNOSIS — H109 Unspecified conjunctivitis: Secondary | ICD-10-CM | POA: Diagnosis not present

## 2017-07-01 DIAGNOSIS — Z8249 Family history of ischemic heart disease and other diseases of the circulatory system: Secondary | ICD-10-CM

## 2017-07-01 DIAGNOSIS — F419 Anxiety disorder, unspecified: Secondary | ICD-10-CM | POA: Diagnosis present

## 2017-07-01 DIAGNOSIS — I482 Chronic atrial fibrillation, unspecified: Secondary | ICD-10-CM | POA: Diagnosis present

## 2017-07-01 DIAGNOSIS — I2699 Other pulmonary embolism without acute cor pulmonale: Secondary | ICD-10-CM | POA: Diagnosis not present

## 2017-07-01 DIAGNOSIS — R0602 Shortness of breath: Secondary | ICD-10-CM | POA: Diagnosis not present

## 2017-07-01 DIAGNOSIS — I351 Nonrheumatic aortic (valve) insufficiency: Secondary | ICD-10-CM | POA: Diagnosis present

## 2017-07-01 DIAGNOSIS — A419 Sepsis, unspecified organism: Secondary | ICD-10-CM | POA: Diagnosis not present

## 2017-07-01 DIAGNOSIS — I5032 Chronic diastolic (congestive) heart failure: Secondary | ICD-10-CM

## 2017-07-01 DIAGNOSIS — J181 Lobar pneumonia, unspecified organism: Secondary | ICD-10-CM | POA: Diagnosis not present

## 2017-07-01 DIAGNOSIS — J9621 Acute and chronic respiratory failure with hypoxia: Secondary | ICD-10-CM | POA: Diagnosis present

## 2017-07-01 DIAGNOSIS — Z833 Family history of diabetes mellitus: Secondary | ICD-10-CM | POA: Diagnosis not present

## 2017-07-01 DIAGNOSIS — Z7901 Long term (current) use of anticoagulants: Secondary | ICD-10-CM | POA: Diagnosis not present

## 2017-07-01 DIAGNOSIS — I5031 Acute diastolic (congestive) heart failure: Secondary | ICD-10-CM | POA: Diagnosis not present

## 2017-07-01 DIAGNOSIS — J189 Pneumonia, unspecified organism: Secondary | ICD-10-CM | POA: Diagnosis not present

## 2017-07-01 DIAGNOSIS — F32A Depression, unspecified: Secondary | ICD-10-CM | POA: Diagnosis present

## 2017-07-01 DIAGNOSIS — R6520 Severe sepsis without septic shock: Secondary | ICD-10-CM | POA: Diagnosis present

## 2017-07-01 DIAGNOSIS — I10 Essential (primary) hypertension: Secondary | ICD-10-CM | POA: Diagnosis present

## 2017-07-01 DIAGNOSIS — F329 Major depressive disorder, single episode, unspecified: Secondary | ICD-10-CM | POA: Diagnosis present

## 2017-07-01 DIAGNOSIS — J440 Chronic obstructive pulmonary disease with acute lower respiratory infection: Secondary | ICD-10-CM | POA: Diagnosis not present

## 2017-07-01 DIAGNOSIS — E669 Obesity, unspecified: Secondary | ICD-10-CM | POA: Diagnosis present

## 2017-07-01 DIAGNOSIS — R9431 Abnormal electrocardiogram [ECG] [EKG]: Secondary | ICD-10-CM | POA: Diagnosis not present

## 2017-07-01 DIAGNOSIS — Y95 Nosocomial condition: Secondary | ICD-10-CM | POA: Diagnosis present

## 2017-07-01 DIAGNOSIS — T380X5A Adverse effect of glucocorticoids and synthetic analogues, initial encounter: Secondary | ICD-10-CM | POA: Diagnosis present

## 2017-07-01 DIAGNOSIS — E876 Hypokalemia: Secondary | ICD-10-CM | POA: Diagnosis not present

## 2017-07-01 DIAGNOSIS — E785 Hyperlipidemia, unspecified: Secondary | ICD-10-CM | POA: Diagnosis not present

## 2017-07-01 DIAGNOSIS — R05 Cough: Secondary | ICD-10-CM | POA: Diagnosis not present

## 2017-07-01 DIAGNOSIS — K219 Gastro-esophageal reflux disease without esophagitis: Secondary | ICD-10-CM | POA: Diagnosis present

## 2017-07-01 DIAGNOSIS — G459 Transient cerebral ischemic attack, unspecified: Secondary | ICD-10-CM | POA: Diagnosis present

## 2017-07-01 DIAGNOSIS — I5033 Acute on chronic diastolic (congestive) heart failure: Secondary | ICD-10-CM | POA: Diagnosis present

## 2017-07-01 DIAGNOSIS — J159 Unspecified bacterial pneumonia: Secondary | ICD-10-CM | POA: Diagnosis not present

## 2017-07-01 DIAGNOSIS — R0902 Hypoxemia: Secondary | ICD-10-CM | POA: Diagnosis not present

## 2017-07-01 DIAGNOSIS — Z8673 Personal history of transient ischemic attack (TIA), and cerebral infarction without residual deficits: Secondary | ICD-10-CM

## 2017-07-01 DIAGNOSIS — Z6831 Body mass index (BMI) 31.0-31.9, adult: Secondary | ICD-10-CM

## 2017-07-01 DIAGNOSIS — Z950 Presence of cardiac pacemaker: Secondary | ICD-10-CM

## 2017-07-01 LAB — RESPIRATORY PANEL BY PCR
ADENOVIRUS-RVPPCR: NOT DETECTED
Bordetella pertussis: NOT DETECTED
CHLAMYDOPHILA PNEUMONIAE-RVPPCR: NOT DETECTED
CORONAVIRUS HKU1-RVPPCR: NOT DETECTED
CORONAVIRUS NL63-RVPPCR: NOT DETECTED
CORONAVIRUS OC43-RVPPCR: NOT DETECTED
Coronavirus 229E: NOT DETECTED
INFLUENZA A-RVPPCR: NOT DETECTED
Influenza B: NOT DETECTED
METAPNEUMOVIRUS-RVPPCR: NOT DETECTED
Mycoplasma pneumoniae: NOT DETECTED
PARAINFLUENZA VIRUS 1-RVPPCR: NOT DETECTED
PARAINFLUENZA VIRUS 2-RVPPCR: NOT DETECTED
PARAINFLUENZA VIRUS 3-RVPPCR: NOT DETECTED
Parainfluenza Virus 4: NOT DETECTED
RHINOVIRUS / ENTEROVIRUS - RVPPCR: NOT DETECTED
Respiratory Syncytial Virus: NOT DETECTED

## 2017-07-01 LAB — COMPREHENSIVE METABOLIC PANEL
ALBUMIN: 2.7 g/dL — AB (ref 3.5–5.0)
ALT: 19 U/L (ref 14–54)
AST: 34 U/L (ref 15–41)
Alkaline Phosphatase: 107 U/L (ref 38–126)
Anion gap: 14 (ref 5–15)
BUN: 15 mg/dL (ref 6–20)
CHLORIDE: 99 mmol/L — AB (ref 101–111)
CO2: 25 mmol/L (ref 22–32)
Calcium: 8.6 mg/dL — ABNORMAL LOW (ref 8.9–10.3)
Creatinine, Ser: 1.11 mg/dL — ABNORMAL HIGH (ref 0.44–1.00)
GFR calc Af Amer: 50 mL/min — ABNORMAL LOW (ref >60)
GFR calc non Af Amer: 43 mL/min — ABNORMAL LOW (ref >60)
GLUCOSE: 139 mg/dL — AB (ref 65–99)
POTASSIUM: 3.9 mmol/L (ref 3.5–5.1)
SODIUM: 138 mmol/L (ref 135–145)
Total Bilirubin: 1.5 mg/dL — ABNORMAL HIGH (ref 0.3–1.2)
Total Protein: 6.5 g/dL (ref 6.5–8.1)

## 2017-07-01 LAB — I-STAT CG4 LACTIC ACID, ED
LACTIC ACID, VENOUS: 2.24 mmol/L — AB (ref 0.5–1.9)
Lactic Acid, Venous: 2.79 mmol/L (ref 0.5–1.9)

## 2017-07-01 LAB — URINALYSIS, ROUTINE W REFLEX MICROSCOPIC
Bilirubin Urine: NEGATIVE
Glucose, UA: NEGATIVE mg/dL
KETONES UR: NEGATIVE mg/dL
LEUKOCYTES UA: NEGATIVE
NITRITE: NEGATIVE
PROTEIN: NEGATIVE mg/dL
SQUAMOUS EPITHELIAL / LPF: NONE SEEN
Specific Gravity, Urine: 1.01 (ref 1.005–1.030)
pH: 5 (ref 5.0–8.0)

## 2017-07-01 LAB — CBC WITH DIFFERENTIAL/PLATELET
Basophils Absolute: 0 10*3/uL (ref 0.0–0.1)
Basophils Relative: 0 %
Eosinophils Absolute: 0 10*3/uL (ref 0.0–0.7)
Eosinophils Relative: 0 %
HEMATOCRIT: 39.5 % (ref 36.0–46.0)
HEMOGLOBIN: 11.9 g/dL — AB (ref 12.0–15.0)
LYMPHS ABS: 2.9 10*3/uL (ref 0.7–4.0)
Lymphocytes Relative: 23 %
MCH: 25.4 pg — AB (ref 26.0–34.0)
MCHC: 30.1 g/dL (ref 30.0–36.0)
MCV: 84.2 fL (ref 78.0–100.0)
MONOS PCT: 13 %
Monocytes Absolute: 1.6 10*3/uL — ABNORMAL HIGH (ref 0.1–1.0)
NEUTROS ABS: 7.9 10*3/uL — AB (ref 1.7–7.7)
NEUTROS PCT: 64 %
Platelets: 192 10*3/uL (ref 150–400)
RBC: 4.69 MIL/uL (ref 3.87–5.11)
RDW: 16.3 % — ABNORMAL HIGH (ref 11.5–15.5)
WBC: 12.5 10*3/uL — ABNORMAL HIGH (ref 4.0–10.5)

## 2017-07-01 LAB — PROTIME-INR
INR: 2.27
Prothrombin Time: 24.9 seconds — ABNORMAL HIGH (ref 11.4–15.2)

## 2017-07-01 LAB — I-STAT ARTERIAL BLOOD GAS, ED
ACID-BASE EXCESS: 1 mmol/L (ref 0.0–2.0)
Bicarbonate: 25.8 mmol/L (ref 20.0–28.0)
O2 SAT: 93 %
PCO2 ART: 41.3 mmHg (ref 32.0–48.0)
TCO2: 27 mmol/L (ref 22–32)
pH, Arterial: 7.404 (ref 7.350–7.450)
pO2, Arterial: 68 mmHg — ABNORMAL LOW (ref 83.0–108.0)

## 2017-07-01 LAB — TROPONIN I
Troponin I: <0.03 ng/mL (ref <0.03)
Troponin I: <0.03 ng/mL (ref <0.03)

## 2017-07-01 LAB — PROCALCITONIN: Procalcitonin: 0.32 ng/mL

## 2017-07-01 LAB — INFLUENZA PANEL BY PCR (TYPE A & B)
Influenza A By PCR: NEGATIVE
Influenza B By PCR: NEGATIVE

## 2017-07-01 LAB — MRSA PCR SCREENING: MRSA BY PCR: NEGATIVE

## 2017-07-01 LAB — I-STAT TROPONIN, ED: Troponin i, poc: 0.02 ng/mL (ref 0.00–0.08)

## 2017-07-01 LAB — STREP PNEUMONIAE URINARY ANTIGEN: Strep Pneumo Urinary Antigen: NEGATIVE

## 2017-07-01 LAB — BRAIN NATRIURETIC PEPTIDE: B NATRIURETIC PEPTIDE 5: 555.2 pg/mL — AB (ref 0.0–100.0)

## 2017-07-01 LAB — LACTIC ACID, PLASMA: Lactic Acid, Venous: 2.1 mmol/L (ref 0.5–1.9)

## 2017-07-01 MED ORDER — ACETAMINOPHEN 325 MG PO TABS
650.0000 mg | ORAL_TABLET | Freq: Four times a day (QID) | ORAL | Status: DC | PRN
  Administered 2017-07-01 – 2017-07-03 (×2): 650 mg via ORAL
  Filled 2017-07-01 (×2): qty 2

## 2017-07-01 MED ORDER — BISACODYL 10 MG RE SUPP: 10.0000 mg | Freq: Every day | RECTAL | Status: DC | PRN

## 2017-07-01 MED ORDER — IPRATROPIUM-ALBUTEROL 0.5-2.5 (3) MG/3ML IN SOLN
3.0000 mL | RESPIRATORY_TRACT | Status: DC
  Administered 2017-07-01 – 2017-07-02 (×4): 3 mL via RESPIRATORY_TRACT
  Filled 2017-07-01 (×5): qty 3

## 2017-07-01 MED ORDER — FUROSEMIDE 10 MG/ML IJ SOLN
40.0000 mg | Freq: Once | INTRAMUSCULAR | Status: AC
  Administered 2017-07-01: 40 mg via INTRAVENOUS
  Filled 2017-07-01: qty 4

## 2017-07-01 MED ORDER — SENNOSIDES-DOCUSATE SODIUM 8.6-50 MG PO TABS: 1.0000 | ORAL_TABLET | Freq: Every evening | ORAL | Status: DC | PRN

## 2017-07-01 MED ORDER — RIVAROXABAN 15 MG PO TABS: 15.0000 mg | ORAL_TABLET | Freq: Every day | ORAL | Status: DC

## 2017-07-01 MED ORDER — ERYTHROMYCIN 5 MG/GM OP OINT
1.0000 "application " | TOPICAL_OINTMENT | Freq: Once | OPHTHALMIC | Status: AC
  Administered 2017-07-01: 1 via OPHTHALMIC
  Filled 2017-07-01: qty 3.5

## 2017-07-01 MED ORDER — VANCOMYCIN HCL IN DEXTROSE 1-5 GM/200ML-% IV SOLN
1000.0000 mg | Freq: Once | INTRAVENOUS | Status: AC
  Administered 2017-07-01: 1000 mg via INTRAVENOUS
  Filled 2017-07-01: qty 200

## 2017-07-01 MED ORDER — SODIUM CHLORIDE 0.9 % IV BOLUS (SEPSIS): 1000.0000 mL | Freq: Once | INTRAVENOUS | Status: DC

## 2017-07-01 MED ORDER — PIPERACILLIN-TAZOBACTAM 3.375 G IVPB 30 MIN
3.3750 g | Freq: Once | INTRAVENOUS | Status: AC
  Administered 2017-07-01: 3.375 g via INTRAVENOUS
  Filled 2017-07-01: qty 50

## 2017-07-01 MED ORDER — PANTOPRAZOLE SODIUM 40 MG PO TBEC: 40.0000 mg | DELAYED_RELEASE_TABLET | Freq: Every day | ORAL | Status: DC | PRN

## 2017-07-01 MED ORDER — ZOLPIDEM TARTRATE 5 MG PO TABS
5.0000 mg | ORAL_TABLET | Freq: Every day | ORAL | Status: DC
  Administered 2017-07-02 – 2017-07-04 (×3): 5 mg via ORAL
  Filled 2017-07-01 (×3): qty 1

## 2017-07-01 MED ORDER — ONDANSETRON HCL 4 MG PO TABS
4.0000 mg | ORAL_TABLET | Freq: Four times a day (QID) | ORAL | Status: DC | PRN
Start: 2017-07-01 — End: 2017-07-05

## 2017-07-01 MED ORDER — ONDANSETRON HCL 4 MG/2ML IJ SOLN: 4.0000 mg | Freq: Four times a day (QID) | INTRAMUSCULAR | Status: DC | PRN

## 2017-07-01 MED ORDER — ACETAMINOPHEN 650 MG RE SUPP: 650.0000 mg | Freq: Four times a day (QID) | RECTAL | Status: DC | PRN

## 2017-07-01 MED ORDER — ALBUTEROL SULFATE (2.5 MG/3ML) 0.083% IN NEBU: 2.5000 mg | INHALATION_SOLUTION | RESPIRATORY_TRACT | Status: DC | PRN

## 2017-07-01 MED ORDER — PRAVASTATIN SODIUM 40 MG PO TABS
40.0000 mg | ORAL_TABLET | Freq: Every morning | ORAL | Status: DC
  Administered 2017-07-02 – 2017-07-05 (×4): 40 mg via ORAL
  Filled 2017-07-01 (×5): qty 1

## 2017-07-01 MED ORDER — METHYLPREDNISOLONE SODIUM SUCC 125 MG IJ SOLR
125.0000 mg | Freq: Once | INTRAMUSCULAR | Status: AC
  Administered 2017-07-01: 125 mg via INTRAVENOUS
  Filled 2017-07-01: qty 2

## 2017-07-01 MED ORDER — FUROSEMIDE 40 MG PO TABS
40.0000 mg | ORAL_TABLET | Freq: Every morning | ORAL | Status: DC
  Administered 2017-07-02 – 2017-07-05 (×4): 40 mg via ORAL
  Filled 2017-07-01 (×2): qty 1
  Filled 2017-07-01: qty 2
  Filled 2017-07-01: qty 1
  Filled 2017-07-01: qty 2

## 2017-07-01 MED ORDER — POTASSIUM CHLORIDE CRYS ER 20 MEQ PO TBCR
40.0000 meq | EXTENDED_RELEASE_TABLET | Freq: Every day | ORAL | Status: DC
  Administered 2017-07-02: 40 meq via ORAL
  Filled 2017-07-01: qty 2

## 2017-07-01 MED ORDER — PIPERACILLIN-TAZOBACTAM 3.375 G IVPB: 3.3750 g | Freq: Three times a day (TID) | INTRAVENOUS | Status: DC

## 2017-07-01 MED ORDER — DEXTROSE 5 % IV SOLN
2.0000 g | INTRAVENOUS | Status: DC
  Administered 2017-07-01: 2 g via INTRAVENOUS
  Filled 2017-07-01 (×2): qty 2

## 2017-07-01 MED ORDER — SODIUM CHLORIDE 0.9 % IV SOLN
INTRAVENOUS | Status: DC
  Administered 2017-07-01: 75 mL/h via INTRAVENOUS
  Administered 2017-07-02: 05:00:00 via INTRAVENOUS

## 2017-07-01 MED ORDER — NITROGLYCERIN 0.4 MG SL SUBL: 0.4000 mg | SUBLINGUAL_TABLET | SUBLINGUAL | Status: DC | PRN

## 2017-07-01 MED ORDER — ALPRAZOLAM 0.25 MG PO TABS: 0.2500 mg | ORAL_TABLET | Freq: Two times a day (BID) | ORAL | Status: DC | PRN

## 2017-07-01 MED ORDER — ISOSORBIDE MONONITRATE ER 60 MG PO TB24
60.0000 mg | ORAL_TABLET | Freq: Every day | ORAL | Status: DC
  Administered 2017-07-02 – 2017-07-05 (×4): 60 mg via ORAL
  Filled 2017-07-01: qty 1
  Filled 2017-07-01 (×2): qty 2
  Filled 2017-07-01 (×2): qty 1

## 2017-07-01 MED ORDER — VANCOMYCIN HCL IN DEXTROSE 1-5 GM/200ML-% IV SOLN
1000.0000 mg | INTRAVENOUS | Status: DC
  Filled 2017-07-01: qty 200

## 2017-07-01 MED ORDER — IPRATROPIUM-ALBUTEROL 0.5-2.5 (3) MG/3ML IN SOLN
3.0000 mL | Freq: Once | RESPIRATORY_TRACT | Status: AC
  Administered 2017-07-01: 3 mL via RESPIRATORY_TRACT
  Filled 2017-07-01: qty 3

## 2017-07-01 MED ORDER — VENLAFAXINE HCL ER 150 MG PO CP24
150.0000 mg | ORAL_CAPSULE | Freq: Every day | ORAL | Status: DC
  Administered 2017-07-02: 150 mg via ORAL
  Filled 2017-07-01: qty 1
  Filled 2017-07-01: qty 2
  Filled 2017-07-01: qty 1

## 2017-07-01 MED ORDER — POLYMYXIN B-TRIMETHOPRIM 10000-0.1 UNIT/ML-% OP SOLN
1.0000 [drp] | Freq: Four times a day (QID) | OPHTHALMIC | Status: DC
  Administered 2017-07-02 – 2017-07-05 (×14): 1 [drp] via OPHTHALMIC
  Filled 2017-07-01: qty 10

## 2017-07-01 MED ORDER — METOPROLOL TARTRATE 25 MG PO TABS
25.0000 mg | ORAL_TABLET | Freq: Two times a day (BID) | ORAL | Status: DC
  Administered 2017-07-02 – 2017-07-05 (×8): 25 mg via ORAL
  Filled 2017-07-01 (×9): qty 1

## 2017-07-01 NOTE — ED Notes (Signed)
Patient transported to X-ray 

## 2017-07-01 NOTE — H&P (Signed)
History and Physical    Raven Gray JSE:[id number] DOB: May 24, 1928 DOA: 07/01/2017   PCP: [name], MD   Patient coming from:  Home    Chief Complaint: Shortness of breath and fever   HPI: Raven Gray is a 83 y.o. female with medical history significant for HTN, HLD, COPD on prn home O2 , chronic atrial fibrillation on chronic Xarelto, history of pacemaker placement due to [facility] tachycardia syndrome, history of PE-DVT, history of stroke, presenting with 1 week history of increasing shortness of breath, accompanied by productive green thick sputum, associated with fever up to 102, chills, night sweats and generalized weakness. Denies rhinorrhea or hemoptysis. Denies chest pain, chest wall pain or palpitations.  Denies any sick contacts or recent long distance travel. Has decreased appetite due to current symptoms. Denies dizziness or vertigo. Denies lower extremity swelling. No confusion was reported. Denies any vision changes, double vision or headaches. She reports having taking her flu vaccination 1 week ago.  ED Course:  BP (!) 144/74   Pulse 96   Temp (!) 101.9 F (38.8 C) (Rectal)   Resp (!) 35   Ht 5\' 3"  (1.6 m)   Wt 79.8 kg (176 lb)   SpO2 91%   BMI 31.18 kg/m    BNP pending 2 D echo April 2018  The cavity size was normal. Wall thickness was increased in a pattern of moderate LVH. The estimated ejection fraction was 55%. Wall motion was normal; there were no regional wall motion abnormalities.  Troponin 0.02 Lactic acid 2.79 White count 12.5 hemoglobin 11.9 platelets 192 Creatinine 1.11 at baseline PT 24.9, INR 2.27   glucose 139   received Solu-Medrol 125 mg IV 1, as well as Duoneb on arrival, placed on continuous O2 with some response. Chest x-ray shows bilateral pulmonary opacities most consistent with pulmonary edema, but bilateral pneumonia cannot be excluded. Received Lasix 40 mg IV 1 at the ER   Review of Systems: As per HPI otherwise 10 point review of systems  negative.   Past Medical History:  Diagnosis Date  . Atrial fibrillation ([place])    a. on Xarelto  . Chest pain   . COPD (chronic obstructive pulmonary disease) ([place])    family unsure if is copd,   . H/O echocardiogram    a. 08/2016: EF of 60-65%, severely dilated LA, mild pulmonic regurgitations, mild TR, and PA Pressure at 38 mm Hg  . History of depression   . History of DVT (deep vein thrombosis)   . History of pulmonary embolism   . Hyperlipidemia   . Hypertension   . Hypokalemia    Now improved with treatment  . Shortness of breath   . Stroke ([place])   . Tachy-brady syndrome ([place]) 04/01/2017   s/p MDT PPM     Past Surgical History:  Procedure Laterality Date  . PACEMAKER IMPLANT N/A 04/01/2017   Procedure: Pacemaker Implant;  Surgeon: Sanda Klein, MD;  Location: [facility];  Service: Cardiovascular;  Laterality: N/A; MDT [place] XT SR MRI  . REPLACEMENT TOTAL KNEE    . ROTATOR CUFF REPAIR      Social History Social History   Social History  . Marital status: Widowed    Spouse name: N/A  . Number of children: N/A  . Years of education: N/A   Occupational History  . Not on file.   Social History Main Topics  . Smoking status: Never Smoker  . Smokeless tobacco: Never Used  . Alcohol  use No  . Drug use: No  . Sexual activity: No   Other Topics Concern  . Not on file   Social History Narrative  . No narrative on file     No Known Allergies  Family History  Problem Relation Age of Onset  . Alzheimer's disease Mother   . Heart attack Father   . Diabetes type II Daughter   . Heart disease Daughter        stents      Prior to Admission medications   Medication Sig Start Date End Date Taking? Authorizing Provider  acetaminophen (TYLENOL) 325 MG tablet Take 650 mg by mouth every 6 (six) hours as needed (for pain).     [provider]  ALPRAZolam Duanne Moron) 0.25 MG tablet Take 1 tablet (0.25 mg total) by mouth 2 (two) times daily as needed  for anxiety. 10/21/14   Cristal Ford, DO  furosemide (LASIX) 40 MG tablet Take 1 tablet (40 mg total) by mouth every morning. 01/10/17   Domenic Polite, MD  isosorbide mononitrate (IMDUR) 60 MG 24 hr tablet TAKE 1 TABLET EVERY DAY Patient not taking: Reported on 03/26/2017 02/20/17   Croitoru, Mihai, MD  metoprolol (LOPRESSOR) 25 MG tablet Take 1 tablet (25 mg total) by mouth 2 (two) times daily. 06/20/14   Lorretta Harp, MD  Multiple Vitamins-Minerals (WOMENS 50+ [place] VITAMIN/MIN) TABS Take 1 tablet by mouth daily.    [provider]  nitroGLYCERIN (NITROSTAT) 0.4 MG SL tablet Place 0.4 mg under the tongue as needed for chest pain. X 3 doses 02/08/16   [provider]  omeprazole (PRILOSEC) 20 MG capsule Take 20 mg by mouth daily as needed (for reflux/heartburn).  04/23/15   [provider]  OXYGEN Inhale 2 L into the lungs continuous.     [provider]  potassium chloride SA (K-DUR,KLOR-CON) 20 MEQ tablet Take 40 mEq by mouth daily. Take 2 tablets every morning 01/13/17   Isaiah Serge, NP  pravastatin (PRAVACHOL) 40 MG tablet Take 40 mg by mouth every morning.     [provider]  Rivaroxaban (XARELTO) 15 MG TABS tablet Take 1 tablet (15 mg total) by mouth every morning. 05/26/17   Lorretta Harp, MD  venlafaxine XR (EFFEXOR-XR) 150 MG 24 hr capsule Take 150 mg by mouth daily.    [provider]  zolpidem (AMBIEN) 5 MG tablet Take 5 mg by mouth at bedtime. 11/03/13   [provider]    Physical Exam:  Vitals:   07/01/17 1015 07/01/17 1030 07/01/17 1045 07/01/17 1115  BP: (!) 144/67 140/72 139/76 (!) 144/74  Pulse: 97 95 (!) 101 96  Resp: (!) 24 (!) 36 (!) 34 (!) 35  Temp:      TempSrc:      SpO2: 91% 91% 90% 91%  Weight:      Height:       Constitutional: Mod degree of distress, very ill appearing Eyes: PERRL, lids with green mucus noted and conjunctivae and sclera red  ENMT: Mucous membranes are moist, without  exudate or lesions  Neck: normal, supple, no masses, no thyromegaly Respiratory: bilateral L>R wheezing, no rhonchi, Bilateral crackels audible, respiratory effort noted but no accessory muscle used  Cardiovascular:  Tachy IRR   no murmurs, rubs or gallops. No extremity edema. 2+ pedal pulses. No carotid bruits.  Abdomen: obese, non tender, No hepatosplenomegaly. Bowel sounds positive.  Musculoskeletal: no clubbing / cyanosis. Moves all extremities Skin:  Very warm and  diaphoretic, no jaundice, No lesions.  Neurologic: Sensation intact  Strength equal in all extremities Psychiatric:   Alert and oriented x 3. Anxious     Labs on Admission: I have personally reviewed following labs and imaging studies  CBC:  Recent Labs Lab 07/01/17 0825  WBC 12.5*  NEUTROABS 7.9*  HGB 11.9*  HCT 39.5  MCV 84.2  PLT 829    Basic Metabolic Panel:  Recent Labs Lab 07/01/17 0825  NA 138  K 3.9  CL 99*  CO2 25  GLUCOSE 139*  BUN 15  CREATININE 1.11*  CALCIUM 8.6*    GFR: Estimated Creatinine Clearance: 35.7 mL/min (A) (by C-G formula based on SCr of 1.11 mg/dL (H)).  Liver Function Tests:  Recent Labs Lab 07/01/17 0825  AST 34  ALT 19  ALKPHOS 107  BILITOT 1.5*  PROT 6.5  ALBUMIN 2.7*   No results for input(s): LIPASE, AMYLASE in the last 168 hours. No results for input(s): AMMONIA in the last 168 hours.  Coagulation Profile:  Recent Labs Lab 07/01/17 0825  INR 2.27    Cardiac Enzymes: No results for input(s): CKTOTAL, CKMB, CKMBINDEX, TROPONINI in the last 168 hours.  BNP (last 3 results) No results for input(s): PROBNP in the last 8760 hours.  HbA1C: No results for input(s): HGBA1C in the last 72 hours.  CBG: No results for input(s): GLUCAP in the last 168 hours.  Lipid Profile: No results for input(s): CHOL, HDL, LDLCALC, TRIG, CHOLHDL, LDLDIRECT in the last 72 hours.  Thyroid Function Tests: No results for input(s): TSH, T4TOTAL, FREET4, T3FREE,  THYROIDAB in the last 72 hours.  Anemia Panel: No results for input(s): VITAMINB12, FOLATE, FERRITIN, TIBC, IRON, RETICCTPCT in the last 72 hours.  Urine analysis:    Component Value Date/Time   COLORURINE STRAW (A) 01/08/2017 1957   APPEARANCEUR CLEAR 01/08/2017 1957   LABSPEC 1.008 01/08/2017 1957   PHURINE 5.0 01/08/2017 1957   GLUCOSEU NEGATIVE 01/08/2017 1957   HGBUR SMALL (A) 01/08/2017 [place] NEGATIVE 01/08/2017 1957   KETONESUR NEGATIVE 01/08/2017 1957   PROTEINUR NEGATIVE 01/08/2017 1957   UROBILINOGEN 1.0 10/20/2014 1614   NITRITE NEGATIVE 01/08/2017 1957   LEUKOCYTESUR NEGATIVE 01/08/2017 1957    Sepsis Labs: @LABRCNTIP (procalcitonin:4,lacticidven:4) )No results found for this or any previous visit (from the past 240 hour(s)).   Radiological Exams on Admission: Dg Chest 2 View  Result Date: 07/01/2017 CLINICAL DATA:  One week history of cough and increasing shortness of breath. EXAM: CHEST  2 VIEW COMPARISON:  04/02/2017. FINDINGS: The heart is enlarged. Single lead pacer unchanged. BILATERAL pulmonary opacities most consistent with pulmonary edema, BILATERAL pneumonia less likely. Small effusions. No pneumothorax. IMPRESSION: Worsening aeration.  Cardiomegaly.  Developing pulmonary edema. Electronically Signed   By: Staci Righter M.D.   On: 07/01/2017 08:39    EKG: Independently reviewed.  Assessment/Plan Active Problems:   Healthcare-associated pneumonia   Acute respiratory failure with hypoxia (HCC)   Acute diastolic heart failure (HCC)   Sepsis (HCC)   Hypoxia   Pulmonary embolism (HCC)   Aortic insufficiency   Hyperlipidemia   TIA (transient ischemic attack)   COPD (chronic obstructive pulmonary disease) (HCC)   Essential hypertension   GERD (gastroesophageal reflux disease)   Depression   Chronic anticoagulation   Chronic atrial fibrillation (HCC)   Pacemaker   Conjunctivitis   Sepsis  likely due to Pneumonia and CHF exacerbation  (see CHF below).Patient meets criteria given tachycardia, tachypnea, fever 101.9 , leukocytosis 12.5, and  evidence of organ dysfunction, lactic acid 2.79 UA pending. Bicarb 25.  Presenting now with ongoing / progressive symptoms including productive cough, fever and chills .CXR today suggests Bilateral pulmonary edema and possible developing bilateral pneumonia  received Solu-Medrol 125 mg IV 1, as well as Duoneb on arrival, placed on continuous O2 with some response. Antibiotics delivered in the ED Vanc and Zosyn .Continues to need 2 L O2, to maintain O2 sats above 90 (88 at RA) Admit to SDU Sepsis order set  IV antibiotics by pharmacy with Vanc and Cefepime per protocol  DuoNebs and albuterol  Follow lactic acid q 3 h  Follow blood and sputum cultures IV fluids at 75 cc/h.  Procalcitonin order set  Antipyretics  Flu and Resp Vir panel   Acute on chronic congestive heart failure   seen per CXR as mentioned above  received IV Lasix 40 mg x1 Last 2 D echo was in 12/2016, moderate LVH. EF 55%. BNP pending  Weight 176 lbs, No peripheral edema noted    Telemetry   Cycle troponins, EKG   Daily weights and strict I/O  Lasix  home dose    Continue aspirin   Atrial Fibrillation CHADSVASC 8   on anticoagulation with Xarelto  Continue meds including Xarelto   History of PE and DVT, history of CVA, PMP  on Xarelto  no new findings.  Continue Xarelto    Conjunctivitis, noted in both eyes Start Polytrim ophtalmic drops, I drop each eye  Hypertension BP 134/55   Pulse 100     Resume  home anti-hypertensive medications   Hyperlipidemia Continue home statins  Chronic kidney disease stage    baseline creatinine 1.1  At baseline Lab Results  Component Value Date   CREATININE 1.11 (H) 07/01/2017   CREATININE 1.32 (H) 03/23/2017   CREATININE 1.06 (H) 01/09/2017  Repeat CMET in am     GERD, no acute symptoms Continue PPI   Anxiety and Depression Continue Xanax for anxiety and  Effexor for depression    DVT prophylaxis Xarelto  Code Status:   Full      Family Communication:  Discussed with patient Disposition Plan: Expect patient to be discharged to home after condition improves Consults called:    None  Admission status:   SDU    Chelcy Bolda E, PA-C [facility]   07/01/2017, 11:19 AM

## 2017-07-01 NOTE — ED Notes (Signed)
Dr.Liu shown results of Lactic Acid. ED-Lab

## 2017-07-01 NOTE — ED Notes (Signed)
Pt noted as less responsive cheeks reddened and noted as sweating. Axillary temp. 98.9 Dr. Marily Memos notified of decreased respojnsiveness.

## 2017-07-01 NOTE — Plan of Care (Signed)
   Discussed Code status w/ pts daughters who reiterated pts desires to be DNR.  Status updated in system  Linna Darner, MD [place] 07/01/2017, 5:48 PM

## 2017-07-01 NOTE — Progress Notes (Addendum)
Pharmacy Antibiotic Note  Raven Gray is a 83 y.o. female admitted on 07/01/2017 with cough and increasing SOB. Likely pneumonia +/- COPD exacerbation. LA slightly up at 2.79, WBC 12.5, SCr 1.1, eCrCl 30-40 ml/min. Starting broad spectrum abx.   Plan: -Vancomycin 1 g IV q24h -Cefepime 2g/24h -Monitor renal fx, cultures, VT at Css  Height: 5\' 3"  (160 cm) Weight: 176 lb (79.8 kg) IBW/kg (Calculated) : 52.4  Temp (24hrs), Avg:100.2 F (37.9 C), Min:98.4 F (36.9 C), Max:101.9 F (38.8 C)   Recent Labs Lab 07/01/17 0825 07/01/17 0838  WBC 12.5*  --   CREATININE 1.11*  --   LATICACIDVEN  --  2.79*    Estimated Creatinine Clearance: 35.7 mL/min (A) (by C-G formula based on SCr of 1.11 mg/dL (H)).     Antimicrobials this admission: 10/10 vancomycin > 10/10 zosyn x 10/10 cefepime >  Dose adjustments this admission: N/A  Microbiology results: 10/10 blood cx: 10/10 sputum: 10/10 RVP:    Harvel Quale 07/01/2017 11:08 AM

## 2017-07-01 NOTE — ED Triage Notes (Addendum)
Pt arrives EMS from home where she c/o 1 week hx of cough and increasing SHOB. Pt arrives with albuteral 10 ands .5 atrovent neb in progress. EMS states Rhonchi in lower befor neb and ronchi throughout afer neb. Coughing up brown green sputum.

## 2017-07-01 NOTE — Progress Notes (Signed)
Pt placed on bipap per MD request. ABG within normal limits. Pt difficult to arouse. RT will continue to monitor.

## 2017-07-01 NOTE — Progress Notes (Signed)
Pt transported from ED D33 to 3M04 on bipap. Pt stable throughout with no complications. RT will continue to monitor.

## 2017-07-01 NOTE — ED Provider Notes (Signed)
[facility] DEPT Provider Note   CSN: [id number] Arrival date & time: 07/01/17  0746     History   Chief Complaint Chief Complaint  Patient presents with  . Fever  . Cough  . Shortness of Breath    HPI Raven Gray is a 83 y.o. female.  The history is provided by the patient.  Cough  This is a new problem. The current episode started more than 1 week ago. The problem occurs constantly. The problem has been gradually worsening. The cough is productive of sputum. Maximum temperature: subjective fever. Associated symptoms include chills, sweats and shortness of breath. Pertinent negatives include no chest pain. She has tried nothing for the symptoms. The treatment provided no relief.   83 year old female who presents with cough and shortness of breath. She has a history of DVT/PE, chronic atrial fibrillation on Xarelto, COPD, tachybradycardia syndrome with permanent pacemaker. Reports 1 week of gradually worsening cough with shortness of breath and yellow to brown greenish sputum. She has had subjective fevers and chills. Denies any nausea, vomiting, diarrhea, abdominal pain, dysuria or urinary frequency.  Past Medical History:  Diagnosis Date  . Atrial fibrillation ([place])    a. on Xarelto  . Chest pain   . COPD (chronic obstructive pulmonary disease) ([place])    family unsure if is copd,   . H/O echocardiogram    a. 08/2016: EF of 60-65%, severely dilated LA, mild pulmonic regurgitations, mild TR, and PA Pressure at 38 mm Hg  . History of depression   . History of DVT (deep vein thrombosis)   . History of pulmonary embolism   . Hyperlipidemia   . Hypertension   . Hypokalemia    Now improved with treatment  . Shortness of breath   . Stroke ([place])   . Tachy-brady syndrome ([place]) 04/01/2017   s/p MDT PPM     Patient Active Problem List   Diagnosis Date Noted  . Tachycardia-bradycardia syndrome ([place]) 04/01/2017  . Pacemaker 04/01/2017  . Chronic atrial fibrillation ([place])     . Syncope and collapse   . Loss of consciousness ([place]) 01/08/2017  . Dehydration 11/25/2016  . Acute encephalopathy   . Encephalopathy 11/24/2016  . Dyspnea on exertion 10/29/2016  . Chronic respiratory failure with hypoxia ([place]) 10/29/2016  . Chronic anticoagulation 10/23/2016  . TIA (transient ischemic attack) 09/06/2016  . COPD (chronic obstructive pulmonary disease) ([place]) 09/06/2016  . Essential hypertension 09/06/2016  . GERD (gastroesophageal reflux disease) 09/06/2016  . Depression 09/06/2016  . Hyperlipidemia 10/23/2015  . Aortic insufficiency 12/15/2014  . Edema of both legs 09/19/2014  . Atrial fibrillation with slow ventricular response ([place]) 11/21/2013  . Chest pain 11/21/2013  . Community acquired pneumonia 06/19/2012  . Pulmonary embolism ([place]) 06/19/2012  . Hypokalemia 06/18/2012  . COPD with acute exacerbation ([place]) 06/18/2012  . Hypoxia 06/18/2012    Past Surgical History:  Procedure Laterality Date  . PACEMAKER IMPLANT N/A 04/01/2017   Procedure: Pacemaker Implant;  Surgeon: Sanda Klein, MD;  Location: [facility];  Service: Cardiovascular;  Laterality: N/A; MDT Azure XT SR MRI  . REPLACEMENT TOTAL KNEE    . ROTATOR CUFF REPAIR      OB History    No data available       Home Medications    Prior to Admission medications   Medication Sig Start Date End Date Taking? Authorizing Provider  acetaminophen (TYLENOL) 325 MG tablet Take 650 mg by mouth every 6 (six) hours as needed (for pain).  [provider]  ALPRAZolam Duanne Moron) 0.25 MG tablet Take 1 tablet (0.25 mg total) by mouth 2 (two) times daily as needed for anxiety. 10/21/14   Cristal Ford, DO  furosemide (LASIX) 40 MG tablet Take 1 tablet (40 mg total) by mouth every morning. 01/10/17   Domenic Polite, MD  isosorbide mononitrate (IMDUR) 60 MG 24 hr tablet TAKE 1 TABLET EVERY DAY Patient not taking: Reported on 03/26/2017 02/20/17   Croitoru, Mihai, MD  metoprolol (LOPRESSOR) 25  MG tablet Take 1 tablet (25 mg total) by mouth 2 (two) times daily. 06/20/14   Lorretta Harp, MD  Multiple Vitamins-Minerals (WOMENS 50+ [place] VITAMIN/MIN) TABS Take 1 tablet by mouth daily.    [provider]  nitroGLYCERIN (NITROSTAT) 0.4 MG SL tablet Place 0.4 mg under the tongue as needed for chest pain. X 3 doses 02/08/16   [provider]  omeprazole (PRILOSEC) 20 MG capsule Take 20 mg by mouth daily as needed (for reflux/heartburn).  04/23/15   [provider]  OXYGEN Inhale 2 L into the lungs continuous.     [provider]  potassium chloride SA (K-DUR,KLOR-CON) 20 MEQ tablet Take 40 mEq by mouth daily. Take 2 tablets every morning 01/13/17   Isaiah Serge, NP  pravastatin (PRAVACHOL) 40 MG tablet Take 40 mg by mouth every morning.     [provider]  Rivaroxaban (XARELTO) 15 MG TABS tablet Take 1 tablet (15 mg total) by mouth every morning. 05/26/17   Lorretta Harp, MD  venlafaxine XR (EFFEXOR-XR) 150 MG 24 hr capsule Take 150 mg by mouth daily.    [provider]  zolpidem (AMBIEN) 5 MG tablet Take 5 mg by mouth at bedtime. 11/03/13   [provider]    Family History Family History  Problem Relation Age of Onset  . Alzheimer's disease Mother   . Heart attack Father   . Diabetes type II Daughter   . Heart disease Daughter        stents    Social History Social History  Substance Use Topics  . Smoking status: Never Smoker  . Smokeless tobacco: Never Used  . Alcohol use No     Allergies   Patient has no known allergies.   Review of Systems Review of Systems  Constitutional: Positive for chills.  Respiratory: Positive for cough and shortness of breath.   Cardiovascular: Negative for chest pain.  Gastrointestinal: Negative for abdominal pain.  Hematological: Bruises/bleeds easily.  All other systems reviewed and are negative.    Physical Exam Updated Vital Signs BP (!) 134/55   Pulse 100   Temp  (!) 101.9 F (38.8 C) (Rectal)   Resp (!) 29   Ht 5\' 3"  (1.6 m)   Wt 79.8 kg (176 lb)   SpO2 92%   BMI 31.18 kg/m   Physical Exam Physical Exam  Nursing note and vitals reviewed. Constitutional: Well developed, well nourished, appears fatigued and low energy, but non-toxic, and in no acute distress Head: Normocephalic and atraumatic.  Mouth/Throat: Oropharynx is clear and dry mucous membranes. Eyes: bilateral conjunctival injection with purulent discharge  Neck: Normal range of motion. Neck supple.  Cardiovascular: Tachycardic rate and regular rhythm.   Pulmonary/Chest: Effort increased, and breath sounds coarse throughout with expiratory wheezing throughout.  Abdominal: Soft. There is no tenderness. There is no rebound and no guarding.  Musculoskeletal: no significant edema in lower extremities Neurological: Alert, no facial droop, fluent speech Skin: Skin is warm and dry.  Psychiatric: Cooperative   ED Treatments / Results  Labs (all labs ordered are listed, but only abnormal results are displayed) Labs Reviewed  COMPREHENSIVE METABOLIC PANEL - Abnormal; Notable for the following:       Result Value   Chloride 99 (*)    Glucose, Bld 139 (*)    Creatinine, Ser 1.11 (*)    Calcium 8.6 (*)    Albumin 2.7 (*)    Total Bilirubin 1.5 (*)    GFR calc non Af Amer 43 (*)    GFR calc Af Amer 50 (*)    All other components within normal limits  CBC WITH DIFFERENTIAL/PLATELET - Abnormal; Notable for the following:    WBC 12.5 (*)    Hemoglobin 11.9 (*)    MCH 25.4 (*)    RDW 16.3 (*)    Neutro Abs 7.9 (*)    Monocytes Absolute 1.6 (*)    All other components within normal limits  PROTIME-INR - Abnormal; Notable for the following:    Prothrombin Time 24.9 (*)    All other components within normal limits  BRAIN NATRIURETIC PEPTIDE - Abnormal; Notable for the following:    B Natriuretic Peptide 555.2 (*)    All other components within normal limits  I-STAT CG4 LACTIC ACID,  ED - Abnormal; Notable for the following:    Lactic Acid, Venous 2.79 (*)    All other components within normal limits  CULTURE, BLOOD (ROUTINE X 2)  CULTURE, BLOOD (ROUTINE X 2)  URINALYSIS, ROUTINE W REFLEX MICROSCOPIC  INFLUENZA PANEL BY PCR (TYPE A & B)  I-STAT TROPONIN, ED    EKG  EKG Interpretation  Date/Time:  Wednesday July 01 2017 07:55:00 EDT Ventricular Rate:  111 PR Interval:    QRS Duration: 153 QT Interval:  357 QTC Calculation: 486 R Axis:   92 Text Interpretation:  Pacemaker spikes or artifacts Atrial fibrillation Ventricular premature complex RBBB and LPFB ST depression, consider ischemia, diffuse lds Confirmed by Brantley Stage [phone]) on 07/01/2017 8:14:48 AM       Radiology Dg Chest 2 View  Result Date: 07/01/2017 CLINICAL DATA:  One week history of cough and increasing shortness of breath. EXAM: CHEST  2 VIEW COMPARISON:  04/02/2017. FINDINGS: The heart is enlarged. Single lead pacer unchanged. BILATERAL pulmonary opacities most consistent with pulmonary edema, BILATERAL pneumonia less likely. Small effusions. No pneumothorax. IMPRESSION: Worsening aeration.  Cardiomegaly.  Developing pulmonary edema. Electronically Signed   By: Staci Righter M.D.   On: 07/01/2017 08:39    Procedures Procedures (including critical care time)  Medications Ordered in ED Medications  vancomycin (VANCOCIN) IVPB 1000 mg/200 mL premix (1,000 mg Intravenous New Bag/Given 07/01/17 1002)  piperacillin-tazobactam (ZOSYN) IVPB 3.375 g (3.375 g Intravenous New Bag/Given 07/01/17 1000)  ipratropium-albuterol (DUONEB) 0.5-2.5 (3) MG/3ML nebulizer solution 3 mL (3 mLs Nebulization Given 07/01/17 0900)  methylPREDNISolone sodium succinate (SOLU-MEDROL) 125 mg/2 mL injection 125 mg (125 mg Intravenous Given 07/01/17 0902)  erythromycin ophthalmic ointment 1 application (1 application Both Eyes Given 07/01/17 0909)  furosemide (LASIX) injection 40 mg (40 mg Intravenous Given 07/01/17 0951)       Initial Impression / Assessment and Plan / ED Course  I have reviewed the triage vital signs and the nursing notes.  Pertinent labs & imaging results that were available during my care of the patient were reviewed by me and considered in my medical decision making (see chart for details).     Presents with shortness of breath, cough, fatigue x  1 week. Febrile 101.9 on arrival, mildy tachycardic, but normotensive. In mild respiratory distress, tachypnea, low pulse ox 90%. Lungs are coarse, rhonchourous, with wheezing. Started breathing treatments, steroids for presumed COPD with likely pneumonia. cxr visualized, and with evidence of pulmonary edema. Possible pneumonia given bilateral infiltrates too, given fever, mildly leukocytosis, elevated lactate. Marland Kitchen BNP 500s. Normal Troponin and non-ischemic EKG.  Started on vancomycin and zosyn empirically. Influenza swab also pending.  Given pulmonary edema, fluids were held and she is given 40 mg of IV lasix.  Discussed with hospitalist service, who will admit for ongoing treatment.  Final Clinical Impressions(s) / ED Diagnoses   Final diagnoses:  HCAP (healthcare-associated pneumonia)  Acute pulmonary edema (HCC)  COPD exacerbation ([place])    New Prescriptions New Prescriptions   No medications on file     Forde Dandy, MD 07/01/17 1021

## 2017-07-02 ENCOUNTER — Inpatient Hospital Stay (HOSPITAL_COMMUNITY): Payer: Medicare Other

## 2017-07-02 LAB — COMPREHENSIVE METABOLIC PANEL
ALK PHOS: 90 U/L (ref 38–126)
ALT: 18 U/L (ref 14–54)
AST: 26 U/L (ref 15–41)
Albumin: 2.3 g/dL — ABNORMAL LOW (ref 3.5–5.0)
Anion gap: 9 (ref 5–15)
BUN: 21 mg/dL — AB (ref 6–20)
CO2: 26 mmol/L (ref 22–32)
CREATININE: 0.96 mg/dL (ref 0.44–1.00)
Calcium: 8.4 mg/dL — ABNORMAL LOW (ref 8.9–10.3)
Chloride: 104 mmol/L (ref 101–111)
GFR, EST AFRICAN AMERICAN: 60 mL/min — AB (ref >60)
GFR, EST NON AFRICAN AMERICAN: 52 mL/min — AB (ref >60)
GLUCOSE: 155 mg/dL — AB (ref 65–99)
Potassium: 3.4 mmol/L — ABNORMAL LOW (ref 3.5–5.1)
Sodium: 139 mmol/L (ref 135–145)
Total Bilirubin: 0.8 mg/dL (ref 0.3–1.2)
Total Protein: 5.6 g/dL — ABNORMAL LOW (ref 6.5–8.1)

## 2017-07-02 LAB — CBC
HEMATOCRIT: 37.4 % (ref 36.0–46.0)
Hemoglobin: 11.3 g/dL — ABNORMAL LOW (ref 12.0–15.0)
MCH: 25.2 pg — AB (ref 26.0–34.0)
MCHC: 30.2 g/dL (ref 30.0–36.0)
MCV: 83.3 fL (ref 78.0–100.0)
PLATELETS: 195 10*3/uL (ref 150–400)
RBC: 4.49 MIL/uL (ref 3.87–5.11)
RDW: 16.4 % — AB (ref 11.5–15.5)
WBC: 11.3 10*3/uL — ABNORMAL HIGH (ref 4.0–10.5)

## 2017-07-02 LAB — TROPONIN I: TROPONIN I: 0.03 ng/mL — AB (ref <0.03)

## 2017-07-02 LAB — LEGIONELLA PNEUMOPHILA SEROGP 1 UR AG: L. PNEUMOPHILA SEROGP 1 UR AG: NEGATIVE

## 2017-07-02 LAB — STREP PNEUMONIAE URINARY ANTIGEN: Strep Pneumo Urinary Antigen: NEGATIVE

## 2017-07-02 LAB — PROTIME-INR
INR: 1.53
Prothrombin Time: 18.3 seconds — ABNORMAL HIGH (ref 11.4–15.2)

## 2017-07-02 MED ORDER — GUAIFENESIN-DM 100-10 MG/5ML PO SYRP
5.0000 mL | ORAL_SOLUTION | ORAL | Status: DC | PRN
  Administered 2017-07-02 – 2017-07-04 (×4): 5 mL via ORAL
  Filled 2017-07-02 (×4): qty 5

## 2017-07-02 MED ORDER — IPRATROPIUM-ALBUTEROL 0.5-2.5 (3) MG/3ML IN SOLN
3.0000 mL | Freq: Two times a day (BID) | RESPIRATORY_TRACT | Status: DC
  Administered 2017-07-02: 3 mL via RESPIRATORY_TRACT
  Filled 2017-07-02: qty 3

## 2017-07-02 MED ORDER — CEFTRIAXONE SODIUM 1 G IJ SOLR
1.0000 g | INTRAMUSCULAR | Status: DC
  Administered 2017-07-02 – 2017-07-04 (×3): 1 g via INTRAVENOUS
  Filled 2017-07-02 (×4): qty 10

## 2017-07-02 MED ORDER — DEXTROSE 5 % IV SOLN
500.0000 mg | INTRAVENOUS | Status: DC
  Administered 2017-07-02 – 2017-07-04 (×3): 500 mg via INTRAVENOUS
  Filled 2017-07-02 (×4): qty 500

## 2017-07-02 MED ORDER — VENLAFAXINE HCL ER 75 MG PO CP24
225.0000 mg | ORAL_CAPSULE | Freq: Every day | ORAL | Status: DC
  Administered 2017-07-03 – 2017-07-04 (×2): 225 mg via ORAL
  Filled 2017-07-02 (×4): qty 3

## 2017-07-02 MED ORDER — RIVAROXABAN 15 MG PO TABS
15.0000 mg | ORAL_TABLET | Freq: Every day | ORAL | Status: DC
  Administered 2017-07-02 – 2017-07-05 (×4): 15 mg via ORAL
  Filled 2017-07-02 (×6): qty 1

## 2017-07-02 MED ORDER — IPRATROPIUM-ALBUTEROL 0.5-2.5 (3) MG/3ML IN SOLN
3.0000 mL | Freq: Four times a day (QID) | RESPIRATORY_TRACT | Status: DC
  Administered 2017-07-02: 3 mL via RESPIRATORY_TRACT
  Filled 2017-07-02: qty 3

## 2017-07-02 NOTE — Progress Notes (Signed)
PROGRESS NOTE    Raven Gray  AST:[id number] DOB: 11/20/1927 DOA: 07/01/2017 PCP: [name], MD    Brief Narrative:  83 year old female who presented shortness of breath and fever. Patient does have a significant past history for hypertension, dyslipidemia, COPD, chronic atrial fibrillation, history of PE/DVT, CVA and tachy brady syndrome status post pacemaker. Patient complaining of 7 days of worseningdyspnea, associated with a green thick sputum and fevers. On her initial physical examination, blood pressure 144/74, heart rate 96, temperature 101.9, respiratory rate 35, oxygen saturation 91% on supplemental oxygen. Moist mucous membranes,bilateral wheezing more left than right, positive rales and increased respiratory effort, heart S1-S2 present, irregularly irregular, no gallops, rubs or murmurs, it was soft nontender, no lower extremities edema. Sodium 138, potassium 3.9, chloride 99, bicarbonate 25, glucose 139, BUN 15, creatinine 1.11, venous lactic acid 2.79, white count 12.5, hemoglobin 11.9, hematocrit 39.5, platelets 192, urinalysis negative for infection. Chest x-ray with by basilar alveolar infiltrates. EKG, atrial fibrillation, normal axis, right bundle branch block, positive PVCs.   Patient was admitted to the hospital working diagnosis of sepsis due to healthcare associated pneumonia, present on admission.   Assessment & Plan:   Active Problems:   Hypoxia   Pulmonary embolism (HCC)   Aortic insufficiency   Hyperlipidemia   TIA (transient ischemic attack)   COPD (chronic obstructive pulmonary disease) (HCC)   Essential hypertension   GERD (gastroesophageal reflux disease)   Depression   Chronic anticoagulation   Chronic atrial fibrillation (HCC)   Pacemaker   Healthcare-associated pneumonia   Acute respiratory failure with hypoxia (HCC)   Acute diastolic heart failure (HCC)   Conjunctivitis   Sepsis ([place])   1. Sepsis due to community acquired pneumonia. Patient only  for 24 hours in the hospital for pacemaker implantation, will de-escalate antibiotic therapy to ceftriaxone and azithromycin. Will continue oxymetry monitoring and supplemental 02 per [place]. Oxygen saturation 95% on 3 lpm nasal cannula. Chest film with bibasilar alveolar infiltrates. Will hold on IV fluids, to follow on a restrictive IV fluids strategy. Will check legionella and pneumococcus urine antigens.   2. Acute on chronic hypoxic respiratory failure. Patient successfully liberated from non invasive mechanical ventilation, will continue oxymetry monitoring and supplemental 02 per [place]. At home on 2 lpm per [place] as needed.     3. Diastolic heart failure acute on chronic. Will continue po furosemide to target negative fluid balance, will continue blood pressure control with metoprolol and isosorbide. Old records personally reviewed echocardiogram 98/9211 with Lv systolic function 94%, with lvh pattern. Pulmonary artery systolic pressure 27 mmHg.   4. COPD. Stable with no signs of exacerbation, will continue close oxymetry monitoring. Continue bronchodilator therap needed albuterol/ipratropium.   5. Atrial fibrillation. Will continue rate control with metoprolol. Anticoagulation with rivaroxaban.    6. HTN. Will continue blood pressure control with metoprolol and isosorbide. To start furosemide in am.   7. History of pulmonary embolism and dvt. Continue anticoagulation with rivaroxaban.     DVT prophylaxis: rivaroxaban  Code Status: dnr Family Communication: no family at the bedside Disposition Plan: Home   Consultants:     Procedures:     Antimicrobials:       Subjective: Patient feeling better, dyspnea has improved but still persistent, associated with cough, but no chest pain or palpitations, no nausea or vomiting, no abdominal pain.   Objective: Vitals:   07/02/17 0400 07/02/17 0500 07/02/17 0749 07/02/17 0806  BP: 139/84  126/67   Pulse: 98  (!) 106  Resp: (!) 21  15     Temp:   98.3 F (36.8 C)   TempSrc:   Axillary   SpO2: 95%  96% 95%  Weight:  79.8 kg (176 lb)    Height:        Intake/Output Summary (Last 24 hours) at 07/02/17 0833 Last data filed at 07/02/17 0400  Gross per 24 hour  Intake          1288.75 ml  Output              280 ml  Net          1008.75 ml   Filed Weights   07/01/17 0759 07/01/17 1700 07/02/17 0500  Weight: 79.8 kg (176 lb) 79.8 kg (175 lb 14.8 oz) 79.8 kg (176 lb)    Examination:  General: deconditioned Neurology: Awake and alert, non focal  E ENT: mild pallor, no icterus, oral mucosa moist Cardiovascular: No JVD. S1-S2 present, irregulary irregular, no gallops, rubs, or murmurs. Trace lower extremity edema. Pulmonary: decreased breath sounds bilaterally, decreased air movement, no wheezing, rhonchi. Positive bibasilar rales. Gastrointestinal. Abdomen mild distention, no organomegaly, non tender, no rebound or guarding Skin. No rashes Musculoskeletal: no joint deformities     Data Reviewed: I have personally reviewed following labs and imaging studies  CBC:  Recent Labs Lab 07/01/17 0825 07/02/17 0505  WBC 12.5* 11.3*  NEUTROABS 7.9*  --   HGB 11.9* 11.3*  HCT 39.5 37.4  MCV 84.2 83.3  PLT 192 245   Basic Metabolic Panel:  Recent Labs Lab 07/01/17 0825 07/02/17 0505  NA 138 139  K 3.9 3.4*  CL 99* 104  CO2 25 26  GLUCOSE 139* 155*  BUN 15 21*  CREATININE 1.11* 0.96  CALCIUM 8.6* 8.4*   GFR: Estimated Creatinine Clearance: 40.4 mL/min (by C-G formula based on SCr of 0.96 mg/dL). Liver Function Tests:  Recent Labs Lab 07/01/17 0825 07/02/17 0505  AST 34 26  ALT 19 18  ALKPHOS 107 90  BILITOT 1.5* 0.8  PROT 6.5 5.6*  ALBUMIN 2.7* 2.3*   No results for input(s): LIPASE, AMYLASE in the last 168 hours. No results for input(s): AMMONIA in the last 168 hours. Coagulation Profile:  Recent Labs Lab 07/01/17 0825 07/02/17 0505  INR 2.27 1.53   Cardiac Enzymes:  Recent  Labs Lab 07/01/17 1752 07/01/17 2237 07/02/17 0505  TROPONINI <0.03 <0.03 0.03*   BNP (last 3 results) No results for input(s): PROBNP in the last 8760 hours. HbA1C: No results for input(s): HGBA1C in the last 72 hours. CBG: No results for input(s): GLUCAP in the last 168 hours. Lipid Profile: No results for input(s): CHOL, HDL, LDLCALC, TRIG, CHOLHDL, LDLDIRECT in the last 72 hours. Thyroid Function Tests: No results for input(s): TSH, T4TOTAL, FREET4, T3FREE, THYROIDAB in the last 72 hours. Anemia Panel: No results for input(s): VITAMINB12, FOLATE, FERRITIN, TIBC, IRON, RETICCTPCT in the last 72 hours.    Radiology Studies: I have reviewed all of the imaging during this hospital visit personally     Scheduled Meds: . furosemide  40 mg Oral q morning - 10a  . ipratropium-albuterol  3 mL Nebulization QID  . isosorbide mononitrate  60 mg Oral Daily  . metoprolol tartrate  25 mg Oral BID  . potassium chloride SA  40 mEq Oral Daily  . pravastatin  40 mg Oral q morning - 10a  . Rivaroxaban  15 mg Oral Q supper  . trimethoprim-polymyxin b  1 drop Both  Eyes Q6H  . venlafaxine XR  150 mg Oral Daily  . zolpidem  5 mg Oral QHS   Continuous Infusions: . sodium chloride 75 mL/hr at 07/02/17 0507  . ceFEPime (MAXIPIME) IV Stopped (07/01/17 2241)  . vancomycin       LOS: 1 day        Tawni Millers, MD [facility] Pager [phone]

## 2017-07-02 NOTE — Progress Notes (Signed)
Patient is resting comfortably in chair on 3L [place] with no respiratory distress noted. BIPAP is not needed at this time. RT will monitor as needed.

## 2017-07-02 NOTE — Plan of Care (Signed)
Problem: Safety: Goal: Ability to remain free from injury will improve Outcome: Progressing High fall risk pt. placed on low bed.

## 2017-07-02 NOTE — Care Management Important Message (Signed)
Important Message  Patient Details  Name: Raven Gray MRN: [id number] Date of Birth: 01-06-1928   Medicare Important Message Given:  Yes    Zenon Mayo, RN 07/02/2017, 2:54 [place] Message  Patient Details  Name: Raven Gray MRN: [id number] Date of Birth: December 02, 1927   Medicare Important Message Given:  Yes    Zenon Mayo, RN 07/02/2017, 2:54 PM

## 2017-07-02 NOTE — Care Management Note (Addendum)
Case Management Note  Patient Details  Name: MICKIE BADDERS MRN: [id number] Date of Birth: [birth year]  Subjective/Objective:     From home alone,  Presents with sepsis secondary to HCAP, she has chronic resp failure  And chf, has home oxygen 2 liters at night, daughter , Monia Sabal lives on [address] in [place]. She has a PCP, Dr. Maudie Mercury, she has medication coverage.  She uses a walker and a cane at home and a w/chair.  Patient will benefit from pt/ot eval.               Action/Plan: NCM will follow for dc needs.   Expected Discharge Date:                  Expected Discharge Plan:     In-House Referral:     Discharge planning Services  CM Consult  Post Acute Care Choice:    Choice offered to:     DME Arranged:    DME Agency:     HH Arranged:    HH Agency:     Status of Service:  In process, will continue to follow  If discussed at Long Length of Stay Meetings, dates discussed:    Additional Comments:  Zenon Mayo, RN 07/02/2017, 2:37 PM

## 2017-07-03 DIAGNOSIS — R0902 Hypoxemia: Secondary | ICD-10-CM

## 2017-07-03 DIAGNOSIS — A419 Sepsis, unspecified organism: Principal | ICD-10-CM

## 2017-07-03 DIAGNOSIS — Z7901 Long term (current) use of anticoagulants: Secondary | ICD-10-CM

## 2017-07-03 DIAGNOSIS — I5031 Acute diastolic (congestive) heart failure: Secondary | ICD-10-CM

## 2017-07-03 DIAGNOSIS — J449 Chronic obstructive pulmonary disease, unspecified: Secondary | ICD-10-CM

## 2017-07-03 DIAGNOSIS — J181 Lobar pneumonia, unspecified organism: Secondary | ICD-10-CM

## 2017-07-03 LAB — CBC WITH DIFFERENTIAL/PLATELET
BAND NEUTROPHILS: 20 %
BASOS ABS: 0 10*3/uL (ref 0.0–0.1)
BASOS PCT: 0 %
Blasts: 0 %
EOS ABS: 0 10*3/uL (ref 0.0–0.7)
EOS PCT: 0 %
HCT: 35.2 % — ABNORMAL LOW (ref 36.0–46.0)
HEMOGLOBIN: 10.5 g/dL — AB (ref 12.0–15.0)
LYMPHS ABS: 1.1 10*3/uL (ref 0.7–4.0)
Lymphocytes Relative: 6 %
MCH: 25.2 pg — AB (ref 26.0–34.0)
MCHC: 29.8 g/dL — AB (ref 30.0–36.0)
MCV: 84.6 fL (ref 78.0–100.0)
METAMYELOCYTES PCT: 0 %
MONO ABS: 0.5 10*3/uL (ref 0.1–1.0)
MONOS PCT: 3 %
MYELOCYTES: 0 %
NEUTROS ABS: 16.2 10*3/uL — AB (ref 1.7–7.7)
Neutrophils Relative %: 71 %
Other: 0 %
Platelets: 236 10*3/uL (ref 150–400)
Promyelocytes Absolute: 0 %
RBC: 4.16 MIL/uL (ref 3.87–5.11)
RDW: 16.8 % — AB (ref 11.5–15.5)
Smear Review: ADEQUATE
WBC: 17.8 10*3/uL — ABNORMAL HIGH (ref 4.0–10.5)
nRBC: 0 /100 WBC

## 2017-07-03 LAB — BASIC METABOLIC PANEL
Anion gap: 9 (ref 5–15)
BUN: 37 mg/dL — AB (ref 6–20)
CALCIUM: 8.6 mg/dL — AB (ref 8.9–10.3)
CHLORIDE: 103 mmol/L (ref 101–111)
CO2: 26 mmol/L (ref 22–32)
CREATININE: 1.09 mg/dL — AB (ref 0.44–1.00)
GFR calc non Af Amer: 44 mL/min — ABNORMAL LOW (ref >60)
GFR, EST AFRICAN AMERICAN: 51 mL/min — AB (ref >60)
Glucose, Bld: 138 mg/dL — ABNORMAL HIGH (ref 65–99)
Potassium: 4.1 mmol/L (ref 3.5–5.1)
SODIUM: 138 mmol/L (ref 135–145)

## 2017-07-03 MED ORDER — IPRATROPIUM-ALBUTEROL 0.5-2.5 (3) MG/3ML IN SOLN: 3.0000 mL | RESPIRATORY_TRACT | Status: DC | PRN

## 2017-07-03 MED ORDER — IPRATROPIUM-ALBUTEROL 0.5-2.5 (3) MG/3ML IN SOLN
3.0000 mL | RESPIRATORY_TRACT | Status: DC
  Administered 2017-07-03: 3 mL via RESPIRATORY_TRACT
  Filled 2017-07-03 (×2): qty 3

## 2017-07-03 MED ORDER — IPRATROPIUM-ALBUTEROL 0.5-2.5 (3) MG/3ML IN SOLN
3.0000 mL | Freq: Four times a day (QID) | RESPIRATORY_TRACT | Status: DC
  Administered 2017-07-03 (×2): 3 mL via RESPIRATORY_TRACT
  Filled 2017-07-03 (×2): qty 3

## 2017-07-03 NOTE — Progress Notes (Signed)
PROGRESS NOTE    Raven Gray  ZYS:[id number] DOB: Dec 07, 1927 DOA: 07/01/2017 PCP: [name], MD    Brief Narrative:  83 year old female who presented shortness of breath and fever. Patient does have a significant past history for hypertension, dyslipidemia, COPD, chronic atrial fibrillation, history of PE/DVT, CVA and tachy brady syndrome status post pacemaker. Patient complaining of 7 days of worseningdyspnea, associated with a green thick sputum and fevers. On her initial physical examination, blood pressure 144/74, heart rate 96, temperature 101.9, respiratory rate 35, oxygen saturation 91% on supplemental oxygen. Moist mucous membranes,bilateral wheezing more left than right, positive rales and increased respiratory effort, heart S1-S2 present, irregularly irregular, no gallops, rubs or murmurs, it was soft nontender, no lower extremities edema. Sodium 138, potassium 3.9, chloride 99, bicarbonate 25, glucose 139, BUN 15, creatinine 1.11, venous lactic acid 2.79, white count 12.5, hemoglobin 11.9, hematocrit 39.5, platelets 192, urinalysis negative for infection. Chest x-ray with by basilar alveolar infiltrates. EKG, atrial fibrillation, normal axis, right bundle branch block, positive PVCs.   Patient was admitted to the hospital working diagnosis of sepsis due to healthcare associated pneumonia, present on admission   Assessment & Plan:   Active Problems:   Hypoxia   Pulmonary embolism (HCC)   Aortic insufficiency   Hyperlipidemia   TIA (transient ischemic attack)   COPD (chronic obstructive pulmonary disease) (HCC)   Essential hypertension   GERD (gastroesophageal reflux disease)   Depression   Chronic anticoagulation   Chronic atrial fibrillation (HCC)   Pacemaker   Healthcare-associated pneumonia   Acute respiratory failure with hypoxia (HCC)   Acute diastolic heart failure (HCC)   Conjunctivitis   Sepsis ([place])   1. Sepsis due to community acquired pneumonia. Continue to  improve symptoms, will continue current antibiotic therapy with ceftriaxone and azithromycin, blood cultures no growth, patient continue to be afebrile, white cell count up to 17, likely due to steroids received in the ED, will follow cell count in am. Influenza and respiratory virus negative. Pneumococcal and legionella antigens negative. Will transfer patient to telemetry unit.   2. Acute on chronic hypoxic respiratory failure. Will discontinue Bipap, continue oxymetry monitoring. Currently on 2 LPM of supplemental oxygen per [place] with oxygen saturation 95%. Will add duoneb for bronchodilator therapy and order chest pt. Out of bed as tolerated, and physical therapy evaluation.   3. Diastolic heart failure acute on chronic, echocardiogram 93/2355 with Lv systolic function 73%, with lvh pattern. Clinically euvolemic, will resume home dose of furosemide, and continue isosorbide and metoprolol.   4. COPD. Added albuterol/ipratropium for bronchodilator therapy, no signs of significant exacerbation, will hold on systemic steroids to prevent side effects .   5. Atrial fibrillation. Continue rate control with metoprolol and anticoagulation with rivaroxaban. Will plan to keep telemetry monitor for the next 24 hours.    6. HTN. Systolic blood pressure 220 to 144, will continue blood pressure control with metoprolol and isosorbide.  7. History of pulmonary embolism and dvt. Continue anticoagulation with rivaroxaban with good toleration.     DVT prophylaxis: rivaroxaban  Code Status: dnr Family Communication: no family at the bedside Disposition Plan: Home   Consultants:     Procedures:     Antimicrobials:    Ceftriaxone   Azithromycin.    Subjective: Patient feeling better, no nausea or vomiting, dyspnea has improved, but persistent. Positive cough, productive, with difficulty expectorating.   Objective: Vitals:   07/03/17 0341 07/03/17 0449 07/03/17 0600 07/03/17 0747    BP: (!) 137/92  Marland Kitchen)  144/71 (!) 144/71  Pulse: 83  67 72  Resp: (!) 24  (!) 26 19  Temp: 98.2 F (36.8 C)   98.1 F (36.7 C)  TempSrc: Oral   Oral  SpO2: 92%  98% 95%  Weight:  78.5 kg (173 lb)    Height:        Intake/Output Summary (Last 24 hours) at 07/03/17 0755 Last data filed at 07/03/17 0750  Gross per 24 hour  Intake             1374 ml  Output             1525 ml  Net             -151 ml   Filed Weights   07/01/17 1700 07/02/17 0500 07/03/17 0449  Weight: 79.8 kg (175 lb 14.8 oz) 79.8 kg (176 lb) 78.5 kg (173 lb)    Examination:  General: deconditioned Neurology: Awake and alert, non focal  E ENT: mild pallor, no icterus, oral mucosa moist Cardiovascular: No JVD. S1-S2 present, irregulary-irregular, no gallops, rubs, or murmurs. Trace lower extremity edema. Pulmonary: decreased breath sounds bilaterally at bases, adequate air movement, no wheezing, rhonchi, positive bibasilar rales at bases. Gastrointestinal. Abdomen mild distended, no organomegaly, non tender, no rebound or guarding Skin. No rashes Musculoskeletal: no joint deformities     Data Reviewed: I have personally reviewed following labs and imaging studies  CBC:  Recent Labs Lab 07/01/17 0825 07/02/17 0505 07/03/17 0508  WBC 12.5* 11.3* 17.8*  NEUTROABS 7.9*  --  PENDING  HGB 11.9* 11.3* 10.5*  HCT 39.5 37.4 35.2*  MCV 84.2 83.3 84.6  PLT 192 195 163   Basic Metabolic Panel:  Recent Labs Lab 07/01/17 0825 07/02/17 0505 07/03/17 0508  NA 138 139 138  K 3.9 3.4* 4.1  CL 99* 104 103  CO2 25 26 26   GLUCOSE 139* 155* 138*  BUN 15 21* 37*  CREATININE 1.11* 0.96 1.09*  CALCIUM 8.6* 8.4* 8.6*   GFR: Estimated Creatinine Clearance: 35.3 mL/min (A) (by C-G formula based on SCr of 1.09 mg/dL (H)). Liver Function Tests:  Recent Labs Lab 07/01/17 0825 07/02/17 0505  AST 34 26  ALT 19 18  ALKPHOS 107 90  BILITOT 1.5* 0.8  PROT 6.5 5.6*  ALBUMIN 2.7* 2.3*   No results for  input(s): LIPASE, AMYLASE in the last 168 hours. No results for input(s): AMMONIA in the last 168 hours. Coagulation Profile:  Recent Labs Lab 07/01/17 0825 07/02/17 0505  INR 2.27 1.53   Cardiac Enzymes:  Recent Labs Lab 07/01/17 1752 07/01/17 2237 07/02/17 0505  TROPONINI <0.03 <0.03 0.03*   BNP (last 3 results) No results for input(s): PROBNP in the last 8760 hours. HbA1C: No results for input(s): HGBA1C in the last 72 hours. CBG: No results for input(s): GLUCAP in the last 168 hours. Lipid Profile: No results for input(s): CHOL, HDL, LDLCALC, TRIG, CHOLHDL, LDLDIRECT in the last 72 hours. Thyroid Function Tests: No results for input(s): TSH, T4TOTAL, FREET4, T3FREE, THYROIDAB in the last 72 hours. Anemia Panel: No results for input(s): VITAMINB12, FOLATE, FERRITIN, TIBC, IRON, RETICCTPCT in the last 72 hours.    Radiology Studies: I have reviewed all of the imaging during this hospital visit personally     Scheduled Meds: . furosemide  40 mg Oral q morning - 10a  . isosorbide mononitrate  60 mg Oral Daily  . metoprolol tartrate  25 mg Oral BID  . potassium chloride SA  40 mEq Oral Daily  . pravastatin  40 mg Oral q morning - 10a  . Rivaroxaban  15 mg Oral Q breakfast  . trimethoprim-polymyxin b  1 drop Both Eyes Q6H  . venlafaxine XR  225 mg Oral Daily  . zolpidem  5 mg Oral QHS   Continuous Infusions: . azithromycin Stopped (07/02/17 1213)  . cefTRIAXone (ROCEPHIN)  IV Stopped (07/02/17 1141)     LOS: 2 days        Susana Gripp Gerome Apley, MD [facility] Pager [phone]

## 2017-07-03 NOTE — Progress Notes (Signed)
Occupational Therapy Evaluation Patient Details Name: Raven Gray MRN: [id number] DOB: 01/29/1928 Today's Date: 07/03/2017    History of Present Illness 83 year old female who presented shortness of breath and fever. Patient does have a significant past history for hypertension, dyslipidemia, COPD, chronic atrial fibrillation, history of PE/DVT, CVA and tachy brady syndrome status post pacemaker. Patient complaining of 7 days of worseningdyspnea, associated with a green thick sputum and fevers. Patient was admitted to the hospital working diagnosis of sepsis due to healthcare associated pneumonia, present on admission. She is from home alone and uses a walker, cane, and wc at baseline.   Clinical Impression   PTA pt modified independent in ADL and mobility with 4 wheeled RW in home and wc for community trips. Pt lives alone and prefers baths to showers. Pt is currently min A for ADL and mobility with RW. Please see full problem list below. Moderate multimodal cues for safety with RW. Pt will benefit from skilled OT in the acute setting and aftewards at the [facility] level to maximize safety and independence in ADL and functional transfers. Next session to focus on safety in the tub (tub chair vs bath) and energy conservation techniques during ADL (please provide handout)    Follow Up Recommendations  Home health OT    Equipment Recommendations  Tub/shower seat    Recommendations for Other Services       Precautions / Restrictions Precautions Precautions: Fall Restrictions Weight Bearing Restrictions: No      Mobility Bed Mobility               General bed mobility comments: Pt OOB in recliner when OT entered  Transfers Overall transfer level: Needs assistance Equipment used: Rolling walker (2 wheeled) Transfers: Sit to/from Stand Sit to Stand: Min guard         General transfer comment: no physical assist needed    Balance Overall balance assessment: Needs  assistance Sitting-balance support: No upper extremity supported;Feet supported Sitting balance-Leahy Scale: Good     Standing balance support: Bilateral upper extremity supported;Single extremity supported;During functional activity Standing balance-Leahy Scale: Fair Standing balance comment: Pt able to lean against sink for grooming, reliant on RW during dynamic movement                           ADL either performed or assessed with clinical judgement   ADL Overall ADL's : Needs assistance/impaired Eating/Feeding: Modified independent;Sitting Eating/Feeding Details (indicate cue type and reason): sitting in recliner at the EOS Grooming: Wash/dry face;Oral care;Min guard;Standing Grooming Details (indicate cue type and reason): close min guard for safety Upper Body Bathing: Min guard   Lower Body Bathing: Min guard   Upper Body Dressing : Modified independent   Lower Body Dressing: Supervision/safety   Toilet Transfer: Minimal assistance;Ambulation;RW;BSC;Cueing for safety Toilet Transfer Details (indicate cue type and reason): vc for staying within walker Toileting- Clothing Manipulation and Hygiene: Minimal assistance;Min guard   Tub/ Shower Transfer: Minimal assistance   Functional mobility during ADLs: Minimal assistance;Rolling walker;Cueing for safety General ADL Comments: I believe that this is close to the Pt's baseline     Vision Baseline Vision/History: Wears glasses Wears Glasses: Reading only Patient Visual Report: No change from baseline       Perception     Praxis      Pertinent Vitals/Pain Pain Assessment: No/denies pain     Hand Dominance Right   Extremity/Trunk Assessment Upper Extremity Assessment Upper Extremity Assessment: Generalized  weakness   Lower Extremity Assessment Lower Extremity Assessment: Defer to PT evaluation   Cervical / Trunk Assessment Cervical / Trunk Assessment: Kyphotic   Communication  Communication Communication: No difficulties   Cognition Arousal/Alertness: Awake/alert Behavior During Therapy: WFL for tasks assessed/performed Overall Cognitive Status: Within Functional Limits for tasks assessed                                 General Comments: Pt seemed WFL throughout session; however Pt stated that she'd had breakfast, but then breakfast came in at the end of session.   General Comments  HR 70-115 during session; O2 97% on 2L and stayed above 90 during activity, BP at beginning of session was 131/68 and 168/96 at the EOS.; educated and encouraged use of spirometer    Exercises     Shoulder Instructions      Home Living Family/patient expects to be discharged to:: Private residence Living Arrangements: Alone (daughter lives up the road) Available Help at Discharge: Family;Available 24 hours/day Type of Home: House Home Access: Ramped entrance     Home Layout: One level     Bathroom Shower/Tub: Teacher, early years/pre: Handicapped height Bathroom Accessibility: Yes How Accessible: Accessible via walker Home Equipment: Wheelchair - manual;Cane - single point;Bedside commode;Walker - 4 wheels (life alert)   Additional Comments: on 2 L of O2 at home typically       Prior Functioning/Environment Level of Independence: Independent with assistive device(s)        Comments: SPC and RW or rollator to walk, only sponge bathes, family drives pt to appointments        OT Problem List: Decreased activity tolerance;Impaired balance (sitting and/or standing)      OT Treatment/Interventions: Self-care/ADL training;Energy conservation;Patient/family education;Balance training;Therapeutic activities    OT Goals(Current goals can be found in the care plan section) Acute Rehab OT Goals Patient Stated Goal: to get stronger and get home OT Goal Formulation: With patient Time For Goal Achievement: 07/17/17 Potential to Achieve Goals:  Good ADL Goals Pt Will Perform Grooming: with supervision;standing (with RW) Pt Will Perform Upper Body Bathing: with modified independence;sitting Pt Will Perform Lower Body Bathing: with modified independence;sitting/lateral leans Pt Will Transfer to Toilet: with supervision;ambulating (higher toilet, using RW) Pt Will Perform Toileting - Clothing Manipulation and hygiene: with modified independence;sit to/from stand Pt Will Perform Tub/Shower Transfer: Tub transfer;with caregiver independent in assisting;shower seat;rolling walker Additional ADL Goal #1: Pt will verbalize 3 ways of conserving energy during ADL  OT Frequency: Min 2X/week   Barriers to D/C:            Co-evaluation PT/OT/SLP Co-Evaluation/Treatment: Yes Reason for Co-Treatment: For patient/therapist safety;To address functional/ADL transfers PT goals addressed during session: Mobility/safety with mobility OT goals addressed during session: ADL's and self-care      AM-PAC PT "6 Clicks" Daily Activity     Outcome Measure Help from another person eating meals?: None Help from another person taking care of personal grooming?: A Little Help from another person toileting, which includes using toliet, bedpan, or urinal?: A Little Help from another person bathing (including washing, rinsing, drying)?: A Little Help from another person to put on and taking off regular upper body clothing?: None Help from another person to put on and taking off regular lower body clothing?: A Little 6 Click Score: 20   End of Session Equipment Utilized During Treatment: Rolling walker;Gait belt Nurse  Communication: Mobility status;Other (comment) (O2 removed)  Activity Tolerance: Patient tolerated treatment well Patient left: in chair;with call bell/phone within reach  OT Visit Diagnosis: Unsteadiness on feet (R26.81);Muscle weakness (generalized) (M62.81)                Time: 4917-9150 OT Time Calculation (min): 29 min Charges:  OT  General Charges $OT Visit: 1 Visit OT Evaluation $OT Eval Moderate Complexity: 1 Mod G-Codes:     Raven Gray OTR/L [place] 07/03/2017, 9:26 AM

## 2017-07-03 NOTE — Progress Notes (Signed)
Patient transferred to [facility]. Report given to [place], Therapist, sports. Patient A&OX4 and verbalized understanding that she was moving to another unit. Daughter visiting and went with her. All questions answered. All belongings sent with patient.

## 2017-07-03 NOTE — Evaluation (Signed)
Physical Therapy Evaluation Patient Details Name: Raven Gray MRN: [id number] DOB: Mar 29, 1928 Today's Date: 07/03/2017   History of Present Illness  83 year old female who presented shortness of breath and fever. Patient does have a significant past history for hypertension, dyslipidemia, COPD, chronic atrial fibrillation, history of PE/DVT, CVA and tachy brady syndrome status post pacemaker. Patient complaining of 7 days of worseningdyspnea, associated with a green thick sputum and fevers. Patient was admitted to the hospital working diagnosis of sepsis due to healthcare associated pneumonia, present on admission. She is from home alone and uses a walker, cane, and wc at baseline.  Past Medical History:  Diagnosis Date  . Atrial fibrillation ([place])    a. on Xarelto  . Chest pain   . COPD (chronic obstructive pulmonary disease) ([place])    family unsure if is copd,   . H/O echocardiogram    a. 08/2016: EF of 60-65%, severely dilated LA, mild pulmonic regurgitations, mild TR, and PA Pressure at 38 mm Hg  . History of depression   . History of DVT (deep vein thrombosis)   . History of pulmonary embolism   . Hyperlipidemia   . Hypertension   . Hypokalemia    Now improved with treatment  . Shortness of breath   . Stroke ([place])   . Tachy-brady syndrome ([place]) 04/01/2017   s/p MDT PPM     Clinical Impression  Pt admitted with above diagnosis. Pt currently with functional limitations due to the deficits listed below (see PT Problem List). Pt was able to ambulate with RW with overall fair balance and is appears she is at baseline.  Would benefit from HHPT f/u.  Will follow acutely.  Pt will benefit from skilled PT to increase their independence and safety with mobility to allow discharge to the venue listed below.      Follow Up Recommendations Home health PT;Supervision - Intermittent    Equipment Recommendations  None recommended by PT    Recommendations for Other Services        Precautions / Restrictions Precautions Precautions: Fall Restrictions Weight Bearing Restrictions: No      Mobility  Bed Mobility               General bed mobility comments: Pt OOB in recliner when PT/OT entered  Transfers Overall transfer level: Needs assistance Equipment used: Rolling walker (2 wheeled) Transfers: Sit to/from Stand Sit to Stand: Min guard         General transfer comment: no physical assist needed  Ambulation/Gait Ambulation/Gait assistance: Min assist;+2 safety/equipment Ambulation Distance (Feet): 100 Feet Assistive device: Rolling walker (2 wheeled) Gait Pattern/deviations: Step-through pattern;Decreased stride length;Trunk flexed;Wide base of support;Drifts right/left   Gait velocity interpretation: Below normal speed for age/gender General Gait Details: Pt needed constant cues to stay close to RW.  Pt with no LOB and appears that pt is at baseline.  Overall safe with RW.    Stairs            Wheelchair Mobility    Modified Rankin (Stroke Patients Only)       Balance Overall balance assessment: Needs assistance Sitting-balance support: No upper extremity supported;Feet supported Sitting balance-Leahy Scale: Good     Standing balance support: Bilateral upper extremity supported;Single extremity supported;During functional activity Standing balance-Leahy Scale: Fair Standing balance comment: Pt able to lean against sink for grooming, reliant on RW during dynamic movement  Pertinent Vitals/Pain Pain Assessment: No/denies pain    Home Living Family/patient expects to be discharged to:: Private residence Living Arrangements: Alone (daughter lives up the road) Available Help at Discharge: Family;Available 24 hours/day Type of Home: House Home Access: Ramped entrance     Home Layout: One level Home Equipment: Wheelchair - manual;Cane - single point;Bedside commode;Walker - 4 wheels  (life alert) Additional Comments: on 2 L of O2 at home typically     Prior Function Level of Independence: Independent with assistive device(s)         Comments: SPC and RW or rollator to walk, only sponge bathes, family drives pt to appointments     Hand Dominance   Dominant Hand: Right    Extremity/Trunk Assessment   Upper Extremity Assessment Upper Extremity Assessment: Defer to OT evaluation    Lower Extremity Assessment Lower Extremity Assessment: Generalized weakness    Cervical / Trunk Assessment Cervical / Trunk Assessment: Kyphotic  Communication   Communication: No difficulties  Cognition Arousal/Alertness: Awake/alert Behavior During Therapy: WFL for tasks assessed/performed Overall Cognitive Status: Within Functional Limits for tasks assessed                                 General Comments: Pt seemed WFL throughout session; however Pt stated that she'd had breakfast, but then breakfast came in at the end of session.      General Comments General comments (skin integrity, edema, etc.): HR 70-115 during session; O2 97% on 2L and stayed above 90 during activity, BP at beginning of session was 131/68 and 168/96 at the EOS.; educated and encouraged use of spirometer    Exercises     Assessment/Plan    PT Assessment Patient needs continued PT services  PT Problem List Decreased activity tolerance;Decreased mobility;Decreased balance;Decreased knowledge of use of DME;Decreased safety awareness;Decreased knowledge of precautions       PT Treatment Interventions DME instruction;Gait training;Functional mobility training;Therapeutic activities;Therapeutic exercise;Balance training;Patient/family education    PT Goals (Current goals can be found in the Care Plan section)  Acute Rehab PT Goals Patient Stated Goal: to get stronger and get home PT Goal Formulation: With patient Time For Goal Achievement: 07/17/17 Potential to Achieve Goals:  Good    Frequency Min 3X/week   Barriers to discharge        Co-evaluation PT/OT/SLP Co-Evaluation/Treatment: Yes Reason for Co-Treatment: For patient/therapist safety PT goals addressed during session: Mobility/safety with mobility OT goals addressed during session: ADL's and self-care       AM-PAC PT "6 Clicks" Daily Activity  Outcome Measure Difficulty turning over in bed (including adjusting bedclothes, sheets and blankets)?: None Difficulty moving from lying on back to sitting on the side of the bed? : None Difficulty sitting down on and standing up from a chair with arms (e.g., wheelchair, bedside commode, etc,.)?: None Help needed moving to and from a bed to chair (including a wheelchair)?: None Help needed walking in hospital room?: A Little Help needed climbing 3-5 steps with a railing? : Total 6 Click Score: 20    End of Session Equipment Utilized During Treatment: Gait belt;Oxygen Activity Tolerance: Patient limited by fatigue Patient left: in chair;with call bell/phone within reach Nurse Communication: Mobility status PT Visit Diagnosis: Muscle weakness (generalized) (M62.81);Unsteadiness on feet (R26.81)    Time: 5956-3875 PT Time Calculation (min) (ACUTE ONLY): 26 min   Charges:   PT Evaluation $PT Eval Moderate Complexity: 1 Mod  PT G CodesAmanda Cockayne Acute Rehabilitation [phone] (pager)   Denice Paradise 07/03/2017, 9:54 AM

## 2017-07-04 DIAGNOSIS — I482 Chronic atrial fibrillation: Secondary | ICD-10-CM

## 2017-07-04 DIAGNOSIS — J159 Unspecified bacterial pneumonia: Secondary | ICD-10-CM

## 2017-07-04 DIAGNOSIS — I10 Essential (primary) hypertension: Secondary | ICD-10-CM

## 2017-07-04 DIAGNOSIS — J9601 Acute respiratory failure with hypoxia: Secondary | ICD-10-CM

## 2017-07-04 LAB — BASIC METABOLIC PANEL
ANION GAP: 11 (ref 5–15)
BUN: 31 mg/dL — ABNORMAL HIGH (ref 6–20)
CHLORIDE: 101 mmol/L (ref 101–111)
CO2: 28 mmol/L (ref 22–32)
Calcium: 8.3 mg/dL — ABNORMAL LOW (ref 8.9–10.3)
Creatinine, Ser: 0.94 mg/dL (ref 0.44–1.00)
GFR calc non Af Amer: 53 mL/min — ABNORMAL LOW (ref >60)
Glucose, Bld: 109 mg/dL — ABNORMAL HIGH (ref 65–99)
Potassium: 3.6 mmol/L (ref 3.5–5.1)
Sodium: 140 mmol/L (ref 135–145)

## 2017-07-04 LAB — CBC WITH DIFFERENTIAL/PLATELET
BASOS ABS: 0 10*3/uL (ref 0.0–0.1)
Basophils Relative: 0 %
EOS PCT: 1 %
Eosinophils Absolute: 0.1 10*3/uL (ref 0.0–0.7)
HEMATOCRIT: 36.4 % (ref 36.0–46.0)
HEMOGLOBIN: 11.3 g/dL — AB (ref 12.0–15.0)
LYMPHS ABS: 1.4 10*3/uL (ref 0.7–4.0)
LYMPHS PCT: 12 %
MCH: 26 pg (ref 26.0–34.0)
MCHC: 31 g/dL (ref 30.0–36.0)
MCV: 83.9 fL (ref 78.0–100.0)
MONOS PCT: 10 %
Monocytes Absolute: 1.2 10*3/uL — ABNORMAL HIGH (ref 0.1–1.0)
Neutro Abs: 9.1 10*3/uL — ABNORMAL HIGH (ref 1.7–7.7)
Neutrophils Relative %: 77 %
Platelets: 249 10*3/uL (ref 150–400)
RBC: 4.34 MIL/uL (ref 3.87–5.11)
RDW: 16.7 % — ABNORMAL HIGH (ref 11.5–15.5)
WBC: 11.8 10*3/uL — AB (ref 4.0–10.5)

## 2017-07-04 LAB — LEGIONELLA PNEUMOPHILA SEROGP 1 UR AG: L. pneumophila Serogp 1 Ur Ag: NEGATIVE

## 2017-07-04 MED ORDER — GUAIFENESIN-DM 100-10 MG/5ML PO SYRP
10.0000 mL | ORAL_SOLUTION | Freq: Four times a day (QID) | ORAL | Status: DC
  Administered 2017-07-04 – 2017-07-05 (×5): 10 mL via ORAL
  Filled 2017-07-04 (×4): qty 10

## 2017-07-04 MED ORDER — IPRATROPIUM-ALBUTEROL 0.5-2.5 (3) MG/3ML IN SOLN
3.0000 mL | Freq: Three times a day (TID) | RESPIRATORY_TRACT | Status: DC
  Administered 2017-07-04 – 2017-07-05 (×5): 3 mL via RESPIRATORY_TRACT
  Filled 2017-07-04 (×5): qty 3

## 2017-07-04 MED ORDER — POTASSIUM CHLORIDE 20 MEQ/15ML (10%) PO SOLN
40.0000 meq | Freq: Once | ORAL | Status: AC
Start: 2017-07-04 — End: 2017-07-04
  Administered 2017-07-04: 40 meq via ORAL
  Filled 2017-07-04: qty 30

## 2017-07-04 NOTE — Progress Notes (Signed)
PROGRESS NOTE    BRANIYA FARRUGIA  OJJ:[id number] DOB: 10-27-1927 DOA: 07/01/2017 PCP: [name], MD    Brief Narrative:  83 year old female who presented shortness of breath and fever. Patient does have a significant past history for hypertension, dyslipidemia, COPD, chronic atrial fibrillation, history of PE/DVT, CVA and tachy brady syndrome status post pacemaker. Patient complaining of 7 days of worseningdyspnea, associated with a green thick sputum and fevers. On her initial physical examination, blood pressure 144/74, heart rate 96, temperature 101.9, respiratory rate 35, oxygen saturation 91% on supplemental oxygen. Moist mucous membranes,bilateral wheezing more left than right, positive rales and increased respiratory effort, heart S1-S2 present, irregularly irregular, no gallops, rubs or murmurs, it was soft nontender, no lower extremities edema. Sodium 138, potassium 3.9, chloride 99, bicarbonate 25, glucose 139, BUN 15, creatinine 1.11, venous lactic acid 2.79, white count 12.5, hemoglobin 11.9, hematocrit 39.5, platelets 192, urinalysis negative for infection. Chest x-ray with by basilar alveolar infiltrates. EKG, atrial fibrillation, normal axis, right bundle branch block, positive PVCs.   Patient was admitted to the hospital working diagnosis of sepsis due to community acquired pneumonia, present on admission   Assessment & Plan:   Active Problems:   Hypoxia   Pulmonary embolism (HCC)   Aortic insufficiency   Hyperlipidemia   TIA (transient ischemic attack)   COPD (chronic obstructive pulmonary disease) (HCC)   Essential hypertension   GERD (gastroesophageal reflux disease)   Depression   Chronic anticoagulation   Chronic atrial fibrillation (HCC)   Pacemaker   Healthcare-associated pneumonia   Acute respiratory failure with hypoxia (HCC)   Acute diastolic heart failure (HCC)   Conjunctivitis   Sepsis ([place])   1. Sepsis due to community acquired pneumonia.  Continue current  antibiotic therapy with ceftriaxone and azithromycin, wbc down to 11,8, patient continue be be afebrile, will plan to transition to oral antibiotic therapy in am.  2. Acute on chronic hypoxic respiratory failure. On 2 LPM of supplemental oxygen per [place] with oxygen saturation 98%. Patient with persistent cough, will change cough suppressive agents to scheduled while awake and continue chest pt plus duoneb for bronchodilator therapy. Will arrange for home health and home pt at discharge.   3. Diastolic heart failure acute on chronic, echocardiogram 93/7169 with Lv systolic function 67%, with lvh pattern. Continue clinically euvolemic, on furosemide, isosorbide and metoprolol. Continue telemetry monitoring for now.   4. COPD. Continue albuterol/ipratropium for bronchodilator therapy and scheduled, guaifenesin/ dextromethorphan. No signs of exacerbation, continue to hold on systemic steroids.    5.Atrial fibrillation. Continue rate control with metoprolol and anticoagulation with rivaroxaban. Will plan to keep telemetry monitor for the next 24 hours.   6. HTN. Systolic blood pressure 893 to 160, continue metoprolol and isosorbide.  7. History of pulmonary embolism and dvt. Anticoagulation with rivaroxaban.    8. Hypokalemia. Due to loop diuretic use, will continue repletion with po kcl, follow renal panel in am.   DVT prophylaxis: rivaroxaban Code Status:dnr Family Communication:no family at the bedside Disposition Plan:Home   Consultants:    Procedures:    Antimicrobials:   Ceftriaxone   Azithromycin  Subjective: Patient with persistent cough, unable to seep due to cough, no chest pain, dyspnea is improving, no nausea or vomiting, no fever or chills.   Objective: Vitals:   07/04/17 0004 07/04/17 0624 07/04/17 0752 07/04/17 0809  BP: (!) 136/59 128/72  (!) 165/76  Pulse: 70 (!) 101  86  Resp: 18 18  18   Temp: 98 F (36.7  C) 97.8 F (36.6 C)  98 F  (36.7 C)  TempSrc: Oral Oral  Oral  SpO2: 94% 96% 96% 97%  Weight:  81.1 kg (178 lb 12.8 oz)    Height:        Intake/Output Summary (Last 24 hours) at 07/04/17 0930 Last data filed at 07/04/17 0758  Gross per 24 hour  Intake             1020 ml  Output             2200 ml  Net            -1180 ml   Filed Weights   07/03/17 0449 07/03/17 1211 07/04/17 0624  Weight: 78.5 kg (173 lb) 81.2 kg (179 lb 1.6 oz) 81.1 kg (178 lb 12.8 oz)    Examination:  General: Not in pain or dyspnea, deconditioned Neurology: Awake and alert, non focal  E ENT: mild pallor, no icterus, oral mucosa moist Cardiovascular: No JVD. S1-S2 present, rhythmic, no gallops, rubs, or murmurs. No lower extremity edema. Pulmonary: vesicular breath sounds bilaterally, adequate air movement, no wheezing or rales. Scattered rhonchi.  Gastrointestinal. Abdomen protubrerant, no organomegaly, non tender, no rebound or guarding Skin. No rashes Musculoskeletal: no joint deformities     Data Reviewed: I have personally reviewed following labs and imaging studies  CBC:  Recent Labs Lab 07/01/17 0825 07/02/17 0505 07/03/17 0508 07/04/17 0446  WBC 12.5* 11.3* 17.8* 11.8*  NEUTROABS 7.9*  --  16.2* 9.1*  HGB 11.9* 11.3* 10.5* 11.3*  HCT 39.5 37.4 35.2* 36.4  MCV 84.2 83.3 84.6 83.9  PLT 192 195 236 627   Basic Metabolic Panel:  Recent Labs Lab 07/01/17 0825 07/02/17 0505 07/03/17 0508 07/04/17 0446  NA 138 139 138 140  K 3.9 3.4* 4.1 3.6  CL 99* 104 103 101  CO2 25 26 26 28   GLUCOSE 139* 155* 138* 109*  BUN 15 21* 37* 31*  CREATININE 1.11* 0.96 1.09* 0.94  CALCIUM 8.6* 8.4* 8.6* 8.3*   GFR: Estimated Creatinine Clearance: 42.5 mL/min (by C-G formula based on SCr of 0.94 mg/dL). Liver Function Tests:  Recent Labs Lab 07/01/17 0825 07/02/17 0505  AST 34 26  ALT 19 18  ALKPHOS 107 90  BILITOT 1.5* 0.8  PROT 6.5 5.6*  ALBUMIN 2.7* 2.3*   No results for input(s): LIPASE, AMYLASE in the  last 168 hours. No results for input(s): AMMONIA in the last 168 hours. Coagulation Profile:  Recent Labs Lab 07/01/17 0825 07/02/17 0505  INR 2.27 1.53   Cardiac Enzymes:  Recent Labs Lab 07/01/17 1752 07/01/17 2237 07/02/17 0505  TROPONINI <0.03 <0.03 0.03*   BNP (last 3 results) No results for input(s): PROBNP in the last 8760 hours. HbA1C: No results for input(s): HGBA1C in the last 72 hours. CBG: No results for input(s): GLUCAP in the last 168 hours. Lipid Profile: No results for input(s): CHOL, HDL, LDLCALC, TRIG, CHOLHDL, LDLDIRECT in the last 72 hours. Thyroid Function Tests: No results for input(s): TSH, T4TOTAL, FREET4, T3FREE, THYROIDAB in the last 72 hours. Anemia Panel: No results for input(s): VITAMINB12, FOLATE, FERRITIN, TIBC, IRON, RETICCTPCT in the last 72 hours.    Radiology Studies: I have reviewed all of the imaging during this hospital visit personally     Scheduled Meds: . furosemide  40 mg Oral q morning - 10a  . ipratropium-albuterol  3 mL Nebulization TID  . isosorbide mononitrate  60 mg Oral Daily  . metoprolol tartrate  25 mg Oral BID  . pravastatin  40 mg Oral q morning - 10a  . Rivaroxaban  15 mg Oral Q breakfast  . trimethoprim-polymyxin b  1 drop Both Eyes Q6H  . venlafaxine XR  225 mg Oral Daily  . zolpidem  5 mg Oral QHS   Continuous Infusions: . azithromycin Stopped (07/03/17 1248)  . cefTRIAXone (ROCEPHIN)  IV 1 g (07/04/17 0919)     LOS: 3 days        Mauricio Gerome Apley, MD [facility] Pager [phone]

## 2017-07-05 DIAGNOSIS — I2699 Other pulmonary embolism without acute cor pulmonale: Secondary | ICD-10-CM

## 2017-07-05 LAB — BASIC METABOLIC PANEL
Anion gap: 7 (ref 5–15)
BUN: 16 mg/dL (ref 6–20)
CALCIUM: 8.3 mg/dL — AB (ref 8.9–10.3)
CO2: 32 mmol/L (ref 22–32)
CREATININE: 0.84 mg/dL (ref 0.44–1.00)
Chloride: 100 mmol/L — ABNORMAL LOW (ref 101–111)
GFR calc Af Amer: >60 mL/min (ref >60)
GLUCOSE: 115 mg/dL — AB (ref 65–99)
POTASSIUM: 3.7 mmol/L (ref 3.5–5.1)
SODIUM: 139 mmol/L (ref 135–145)

## 2017-07-05 MED ORDER — AMOXICILLIN-POT CLAVULANATE 875-125 MG PO TABS: 1.0000 | ORAL_TABLET | Freq: Two times a day (BID) | ORAL | 0 refills | Status: AC

## 2017-07-05 MED ORDER — AMOXICILLIN-POT CLAVULANATE 875-125 MG PO TABS
1.0000 | ORAL_TABLET | Freq: Two times a day (BID) | ORAL | Status: DC
  Administered 2017-07-05: 1 via ORAL
  Filled 2017-07-05 (×2): qty 1

## 2017-07-05 MED ORDER — GUAIFENESIN-DM 100-10 MG/5ML PO SYRP: 5.0000 mL | ORAL_SOLUTION | Freq: Four times a day (QID) | ORAL | 0 refills | Status: DC | PRN

## 2017-07-05 NOTE — Care Management Note (Signed)
Case Management Note Original Note Created Zenon Mayo, RN 07/02/2017, 2:37 PM  Patient Details  Name: Raven Gray MRN: [id number] Date of Birth: 06/05/1928  Subjective/Objective:     From home alone,  Presents with sepsis secondary to HCAP, she has chronic resp failure  And chf, has home oxygen 2 liters at night, daughter , Raven Gray lives on [address] in [place]. She has a PCP, Dr. Maudie Mercury, she has medication coverage.  She uses a walker and a cane at home and a w/chair.  Patient will benefit from pt/ot eval.               Action/Plan: NCM will follow for dc needs.   Expected Discharge Date:  07/05/17               Expected Discharge Plan:  [place]  In-House Referral:     Discharge planning Services  CM Consult  Post Acute Care Choice:  Home Health Choice offered to:  Patient, Adult Children  DME Arranged:    DME Agency:     HH Arranged:  RN, PT, Patient Refused [place] Agency:  [facility]  Status of Service:  Completed, signed off  If discussed at [place] of Stay Meetings, dates discussed:    Discharge Disposition: home/self care   Additional Comments:  07/05/17- 1225- Raven Gibbons RN, CM- pt for d/c home today- spoke with pt at bedside who states that she does not have power at home-choice offered for HHRN/PT- per pt she has used [facility] in past- call made to pt's daughter Raven Gray- who also does not have power at home- per Raven Gray there is a daughter in [place] who has power that pt can go stay with- that daughter is on her way to hospital- pt will need 02 - Raven Gray will get pt's concentrator from home to take to the other daughter home in [place]- in mean time will have [place] bring extra tanks for discharge to [facility]- discussed [facility] - and choice offered- per daughter they do not feel pt would benefit from [facility] services and have declined referral for services- no referral made to [facility] for [facility] services- call made to [facility] with [facility]  for extra 02 tanks to be delivered to room prior to discharge.   Raven Gray [place], RN 07/05/2017, 12:25 PM [phone]-- weekend 3E coverage

## 2017-07-05 NOTE — Discharge Summary (Signed)
Physician Discharge Summary  Raven Gray VFI:[id number] DOB: [birth year] DOA: 07/01/2017  PCP: [name], MD  Admit date: 07/01/2017 Discharge date: 07/05/2017  Admitted From: Home Disposition:  Home  Recommendations for Outpatient Follow-up:  1. Follow up with PCP in 1- week 2. Patient has been placed on Augmentin for antibiotic therapy for 5 more days 3. Started on guaifenesin with DM as needed for cough  Home Health: Yes  Equipment/Devices: No   Discharge Condition: Stable CODE STATUS: DNR  Diet recommendation: Heart healthy  Brief/Interim Summary: 83 year old female who presented shortness of breath and fever. Patient does have a significant past history for hypertension, dyslipidemia, COPD, chronic atrial fibrillation, history of PE/DVT, CVA and tachy brady syndrome status post pacemaker. Patient complained of 7 days of worsening dyspnea, associated with a green thick sputum and fevers. On her initial physical examination, blood pressure 144/74, heart rate 96, temperature 101.9, respiratory rate 35, oxygen saturation 91% on supplemental oxygen. Moist mucous membranes, bilateral wheezing more left than right, positive rales and increased respiratory effort, heart S1-S2 present, irregularly irregular, no gallops, rubs or murmurs, abdomen was soft nontender, no lower extremities edema. Sodium 138, potassium 3.9, chloride 99, bicarbonate 25, glucose 139, BUN 15, creatinine 1.11, venous lactic acid 2.79, white count 12.5, hemoglobin 11.9, hematocrit 39.5, platelets 192, urinalysis negative for infection. Chest x-ray with by basilar alveolar infiltrates. EKG, atrial fibrillation, normal axis, right bundle branch block, positive PVCs.   Patient was admitted to the hospital working diagnosis of sepsis due to community acquired pneumonia, present on admission, complicated with acute on chronic hypoxic respiratory failure.   1. Sepsis due to community-acquired pneumonia. Patient was admitted to  the stepdown unit, she was placed on broad spectrum antibiotics with IV ceftriaxone and IV azithromycin. She responded well to medical therapy, she has remained afebrile, her discharge white cell count is 11.8, from a peak of 17.8. Blood cultures remain no growth, Legionella and Streptococcus pneumoniae antigens were negative. Patient will be discharged on Augmentin for 5 more days, and as needed guaifenesin dextromethorphan for cough suppressive therapy.   2. Acute on chronic hypoxic respiratory failure. Patient was initially admitted to the stepdown unit, she was placed on noninvasive mechanical ventilation, she responded well to medical therapy and she was successfully weaned down to regular nasal cannula 2 L/m, with oxygen saturation of 95-97%. Patient has home supplemental oxygen, he uses 2 L/m per nasal cannula.   3. Diastolic heart failure, acute on chronic. chocardiogram from April 4166 showed LV systolic function preserved with 55% ejection fraction, positive left ventricular hypertrophy pattern. Patient tolerated well IV fluids, during herhospitalization furosemide was resumed, and IV fluids were discontinued. Patient will be discharged on metoprolol per her home regimen.  4. COPD. Chronic and stable. No signs of acute exacerbation, patient was placed on bronchodilator therapy with albuterol and ipratropium, cough suppressant therapy with guaifenesin dextromethorphan. Patient received systemic steroids in the emergency department but these agents were not continued during hospitalization.   5. Hypertension. Blood pressure remained well-controlled, patient will continue metoprolol.  6. History of pulmonary embolism and DVT thrombosis. Patient was continued on rivaroxaban, for anticoagulation with good toleration.  7. Atrial fibrillation. Rate remained well-controlled with metoprolol, patient had telemetry monitoring during her hospitalization, continue anticoagulation with rivaroxaban.    Discharge Diagnoses:  Active Problems:   Hypoxia   Pulmonary embolism (HCC)   Aortic insufficiency   Hyperlipidemia   TIA (transient ischemic attack)   COPD (chronic obstructive pulmonary disease) ([place])  Essential hypertension   GERD (gastroesophageal reflux disease)   Depression   Chronic anticoagulation   Chronic atrial fibrillation (HCC)   Pacemaker   Healthcare-associated pneumonia   Acute respiratory failure with hypoxia (HCC)   Acute diastolic heart failure ([place])   Conjunctivitis   Sepsis ([place])    Discharge Instructions   Allergies as of 07/05/2017   No Known Allergies     Medication List    STOP taking these medications   DELSYM 30 MG/5ML liquid Generic drug:  dextromethorphan   isosorbide mononitrate 60 MG 24 hr tablet Commonly known as:  IMDUR     TAKE these medications   acetaminophen 325 MG tablet Commonly known as:  TYLENOL Take 650 mg by mouth every 6 (six) hours as needed (for pain).   ALPRAZolam 0.25 MG tablet Commonly known as:  XANAX Take 1 tablet (0.25 mg total) by mouth 2 (two) times daily as needed for anxiety.   amoxicillin-clavulanate 875-125 MG tablet Commonly known as:  AUGMENTIN Take 1 tablet by mouth 2 (two) times daily.   furosemide 40 MG tablet Commonly known as:  LASIX Take 1 tablet (40 mg total) by mouth every morning.   guaiFENesin-dextromethorphan 100-10 MG/5ML syrup Commonly known as:  ROBITUSSIN DM Take 5 mLs by mouth every 6 (six) hours as needed for cough.   metoprolol tartrate 25 MG tablet Commonly known as:  LOPRESSOR Take 1 tablet (25 mg total) by mouth 2 (two) times daily.   nitroGLYCERIN 0.4 MG SL tablet Commonly known as:  NITROSTAT Place 0.4 mg under the tongue as needed for chest pain. X 3 doses   omeprazole 20 MG capsule Commonly known as:  PRILOSEC Take 20 mg by mouth daily as needed (for reflux/heartburn).   OXYGEN Inhale 2 L into the lungs continuous.   potassium chloride SA 20 MEQ  tablet Commonly known as:  K-DUR,KLOR-CON Take 40 mEq by mouth daily. Take 2 tablets every morning   pravastatin 40 MG tablet Commonly known as:  PRAVACHOL Take 40 mg by mouth every morning.   Rivaroxaban 15 MG Tabs tablet Commonly known as:  XARELTO Take 1 tablet (15 mg total) by mouth every morning.   venlafaxine XR 150 MG 24 hr capsule Commonly known as:  EFFEXOR-XR Take 150 mg by mouth daily. What changed:  Another medication with the same name was removed. Continue taking this medication, and follow the directions you see here.   WOMENS 50+ MULTI VITAMIN/MIN Tabs Take 1 tablet by mouth daily.   zolpidem 5 MG tablet Commonly known as:  AMBIEN Take 5 mg by mouth at bedtime.       No Known Allergies  Consultations:     Procedures/Studies: Dg Chest 2 View  Result Date: 07/01/2017 CLINICAL DATA:  One week history of cough and increasing shortness of breath. EXAM: CHEST  2 VIEW COMPARISON:  04/02/2017. FINDINGS: The heart is enlarged. Single lead pacer unchanged. BILATERAL pulmonary opacities most consistent with pulmonary edema, BILATERAL pneumonia less likely. Small effusions. No pneumothorax. IMPRESSION: Worsening aeration.  Cardiomegaly.  Developing pulmonary edema. Electronically Signed   By: Staci Righter M.D.   On: 07/01/2017 08:39   Dg Chest Port 1 View  Result Date: 07/02/2017 CLINICAL DATA:  83 year old female with history of sepsis. Pneumonia. EXAM: PORTABLE CHEST 1 VIEW COMPARISON:  Chest x-ray 07/01/2017. FINDINGS: Lung volumes are normal. Mild diffuse interstitial prominence and diffuse peribronchial cuffing with some patchy ill-defined opacities throughout the mid to lower lungs bilaterally, similar to the prior examination.  No pleural effusions. Cephalization of the pulmonary vasculature. Mild cardiomegaly. The patient is rotated to the left on today's exam, resulting in distortion of the mediastinal contours and reduced diagnostic sensitivity and specificity  for mediastinal pathology. Atherosclerosis in the thoracic aorta. Left-sided pacemaker device in place with lead tip projecting over the expected location of the right ventricular apex. Multiple soft tissue anchors in the right humeral head related to prior rotator cuff surgery. IMPRESSION: 1. Overall, the appearance the chest suggests mild congestive heart failure, and allowing for differences in patient positioning, the appearance is very similar to the prior examination, as above. 2. Aortic atherosclerosis. Electronically Signed   By: Vinnie Langton M.D.   On: 07/02/2017 07:44   Dg Chest Port 1 View  Result Date: 07/01/2017 CLINICAL DATA:  Respiratory failure. The patient is now nonresponsive. EXAM: PORTABLE CHEST 1 VIEW COMPARISON:  Two-view chest x-ray from the same day at 8:31 a.m. FINDINGS: Heart is enlarged. Aeration is slightly improved. Bibasilar atelectasis is again noted. Moderate pulmonary vascular congestion is evident. Pacemaker is stable. IMPRESSION: 1. Slight improved aeration. 2. Moderate pulmonary vascular congestion remains. 3. Bibasilar atelectasis. Electronically Signed   By: San Morelle M.D.   On: 07/01/2017 16:02       Subjective: Patient feeling better cough and dyspnea have improved, no chest pain, no nausea or vomiting.   Discharge Exam: Vitals:   07/04/17 2041 07/05/17 0521  BP: 115/76 128/61  Pulse:  79  Resp: 20   Temp: 98.2 F (36.8 C) 97.9 F (36.6 C)  SpO2: 97% 95%   Vitals:   07/04/17 1500 07/04/17 1948 07/04/17 2041 07/05/17 0521  BP: 112/60  115/76 128/61  Pulse: 70   79  Resp: 18  20   Temp: 97.9 F (36.6 C)  98.2 F (36.8 C) 97.9 F (36.6 C)  TempSrc: Oral  Oral Oral  SpO2: 98% 96% 97% 95%  Weight:    79.5 kg (175 lb 4.8 oz)  Height:        General: Pt is alert, awake, not in acute distress E ENT: mild pallor no icterus, oral mucosa moist Cardiovascular: RRR, S1/S2 +, no rubs, no gallops. No JVD Respiratory: CTA bilaterally,  no wheezing, no rhonchi Abdominal: Soft, NT, ND, bowel sounds + Extremities: no edema, no cyanosis    The results of significant diagnostics from this hospitalization (including imaging, microbiology, ancillary and laboratory) are listed below for reference.     Microbiology: Recent Results (from the past 240 hour(s))  Culture, blood (Routine x 2)     Status: None (Preliminary result)   Collection Time: 07/01/17  8:25 AM  Result Value Ref Range Status   Specimen Description BLOOD RIGHT HAND  Final   Special Requests   Final    BOTTLES DRAWN AEROBIC AND ANAEROBIC Blood Culture adequate volume   Culture NO GROWTH 3 DAYS  Final   Report Status PENDING  Incomplete  Culture, blood (Routine x 2)     Status: None (Preliminary result)   Collection Time: 07/01/17  8:55 AM  Result Value Ref Range Status   Specimen Description BLOOD RIGHT FOREARM  Final   Special Requests   Final    BOTTLES DRAWN AEROBIC AND ANAEROBIC Blood Culture adequate volume   Culture NO GROWTH 3 DAYS  Final   Report Status PENDING  Incomplete  Respiratory Panel by PCR     Status: None   Collection Time: 07/01/17  8:58 AM  Result Value Ref Range Status  Adenovirus NOT DETECTED NOT DETECTED Final   Coronavirus 229E NOT DETECTED NOT DETECTED Final   Coronavirus HKU1 NOT DETECTED NOT DETECTED Final   Coronavirus NL63 NOT DETECTED NOT DETECTED Final   Coronavirus OC43 NOT DETECTED NOT DETECTED Final   Metapneumovirus NOT DETECTED NOT DETECTED Final   Rhinovirus / Enterovirus NOT DETECTED NOT DETECTED Final   Influenza A NOT DETECTED NOT DETECTED Final   Influenza B NOT DETECTED NOT DETECTED Final   Parainfluenza Virus 1 NOT DETECTED NOT DETECTED Final   Parainfluenza Virus 2 NOT DETECTED NOT DETECTED Final   Parainfluenza Virus 3 NOT DETECTED NOT DETECTED Final   Parainfluenza Virus 4 NOT DETECTED NOT DETECTED Final   Respiratory Syncytial Virus NOT DETECTED NOT DETECTED Final   Bordetella pertussis NOT DETECTED  NOT DETECTED Final   Chlamydophila pneumoniae NOT DETECTED NOT DETECTED Final   Mycoplasma pneumoniae NOT DETECTED NOT DETECTED Final  MRSA PCR Screening     Status: None   Collection Time: 07/01/17  4:44 PM  Result Value Ref Range Status   MRSA by PCR NEGATIVE NEGATIVE Final    Comment:        The GeneXpert MRSA Assay (FDA approved for NASAL specimens only), is one component of a comprehensive MRSA colonization surveillance program. It is not intended to diagnose MRSA infection nor to guide or monitor treatment for MRSA infections.      Labs: BNP (last 3 results)  Recent Labs  10/23/16 1222 07/01/17 0825  BNP 306.7* 229.7*   Basic Metabolic Panel:  Recent Labs Lab 07/01/17 0825 07/02/17 0505 07/03/17 0508 07/04/17 0446 07/05/17 0448  NA 138 139 138 140 139  K 3.9 3.4* 4.1 3.6 3.7  CL 99* 104 103 101 100*  CO2 25 26 26 28  32  GLUCOSE 139* 155* 138* 109* 115*  BUN 15 21* 37* 31* 16  CREATININE 1.11* 0.96 1.09* 0.94 0.84  CALCIUM 8.6* 8.4* 8.6* 8.3* 8.3*   Liver Function Tests:  Recent Labs Lab 07/01/17 0825 07/02/17 0505  AST 34 26  ALT 19 18  ALKPHOS 107 90  BILITOT 1.5* 0.8  PROT 6.5 5.6*  ALBUMIN 2.7* 2.3*   No results for input(s): LIPASE, AMYLASE in the last 168 hours. No results for input(s): AMMONIA in the last 168 hours. CBC:  Recent Labs Lab 07/01/17 0825 07/02/17 0505 07/03/17 0508 07/04/17 0446  WBC 12.5* 11.3* 17.8* 11.8*  NEUTROABS 7.9*  --  16.2* 9.1*  HGB 11.9* 11.3* 10.5* 11.3*  HCT 39.5 37.4 35.2* 36.4  MCV 84.2 83.3 84.6 83.9  PLT 192 195 236 249   Cardiac Enzymes:  Recent Labs Lab 07/01/17 1752 07/01/17 2237 07/02/17 0505  TROPONINI <0.03 <0.03 0.03*   BNP: Invalid input(s): POCBNP CBG: No results for input(s): GLUCAP in the last 168 hours. D-Dimer No results for input(s): DDIMER in the last 72 hours. Hgb A1c No results for input(s): HGBA1C in the last 72 hours. Lipid Profile No results for input(s):  CHOL, HDL, LDLCALC, TRIG, CHOLHDL, LDLDIRECT in the last 72 hours. Thyroid function studies No results for input(s): TSH, T4TOTAL, T3FREE, THYROIDAB in the last 72 hours.  Invalid input(s): FREET3 Anemia work up No results for input(s): VITAMINB12, FOLATE, FERRITIN, TIBC, IRON, RETICCTPCT in the last 72 hours. Urinalysis    Component Value Date/Time   COLORURINE YELLOW 07/01/2017 1225   APPEARANCEUR CLEAR 07/01/2017 1225   LABSPEC 1.010 07/01/2017 1225   PHURINE 5.0 07/01/2017 1225   GLUCOSEU NEGATIVE 07/01/2017 1225   HGBUR SMALL (  A) 07/01/2017 1225   [place] 07/01/2017 [place] 07/01/2017 [place] 07/01/2017 1225   UROBILINOGEN 1.0 10/20/2014 1614   NITRITE NEGATIVE 07/01/2017 1225   LEUKOCYTESUR NEGATIVE 07/01/2017 1225   Sepsis Labs Invalid input(s): PROCALCITONIN,  WBC,  LACTICIDVEN Microbiology Recent Results (from the past 240 hour(s))  Culture, blood (Routine x 2)     Status: None (Preliminary result)   Collection Time: 07/01/17  8:25 AM  Result Value Ref Range Status   Specimen Description BLOOD RIGHT HAND  Final   Special Requests   Final    BOTTLES DRAWN AEROBIC AND ANAEROBIC Blood Culture adequate volume   Culture NO GROWTH 3 DAYS  Final   Report Status PENDING  Incomplete  Culture, blood (Routine x 2)     Status: None (Preliminary result)   Collection Time: 07/01/17  8:55 AM  Result Value Ref Range Status   Specimen Description BLOOD RIGHT FOREARM  Final   Special Requests   Final    BOTTLES DRAWN AEROBIC AND ANAEROBIC Blood Culture adequate volume   Culture NO GROWTH 3 DAYS  Final   Report Status PENDING  Incomplete  Respiratory Panel by PCR     Status: None   Collection Time: 07/01/17  8:58 AM  Result Value Ref Range Status   Adenovirus NOT DETECTED NOT DETECTED Final   Coronavirus 229E NOT DETECTED NOT DETECTED Final   Coronavirus HKU1 NOT DETECTED NOT DETECTED Final   Coronavirus NL63 NOT DETECTED NOT  DETECTED Final   Coronavirus OC43 NOT DETECTED NOT DETECTED Final   Metapneumovirus NOT DETECTED NOT DETECTED Final   Rhinovirus / Enterovirus NOT DETECTED NOT DETECTED Final   Influenza A NOT DETECTED NOT DETECTED Final   Influenza B NOT DETECTED NOT DETECTED Final   Parainfluenza Virus 1 NOT DETECTED NOT DETECTED Final   Parainfluenza Virus 2 NOT DETECTED NOT DETECTED Final   Parainfluenza Virus 3 NOT DETECTED NOT DETECTED Final   Parainfluenza Virus 4 NOT DETECTED NOT DETECTED Final   Respiratory Syncytial Virus NOT DETECTED NOT DETECTED Final   Bordetella pertussis NOT DETECTED NOT DETECTED Final   Chlamydophila pneumoniae NOT DETECTED NOT DETECTED Final   Mycoplasma pneumoniae NOT DETECTED NOT DETECTED Final  MRSA PCR Screening     Status: None   Collection Time: 07/01/17  4:44 PM  Result Value Ref Range Status   MRSA by PCR NEGATIVE NEGATIVE Final    Comment:        The GeneXpert MRSA Assay (FDA approved for NASAL specimens only), is one component of a comprehensive MRSA colonization surveillance program. It is not intended to diagnose MRSA infection nor to guide or monitor treatment for MRSA infections.      Time coordinating discharge: 45 minutes  SIGNED:   Tawni Millers, MD  [facility] 07/05/2017, 9:36 AM Pager [phone]  If 7PM-7AM, please contact night-coverage [email or web address] Password [email or web address]

## 2017-07-06 LAB — CULTURE, BLOOD (ROUTINE X 2)
Culture: NO GROWTH
Culture: NO GROWTH
Special Requests: ADEQUATE
Special Requests: ADEQUATE

## 2017-07-10 DIAGNOSIS — E118 Type 2 diabetes mellitus with unspecified complications: Secondary | ICD-10-CM | POA: Diagnosis not present

## 2017-07-14 ENCOUNTER — Telehealth: Payer: Self-pay | Admitting: Cardiovascular Disease

## 2017-07-14 NOTE — Telephone Encounter (Signed)
Returned call to patient's daughter [name].Xarelto 15 mg samples left at [employer] office front desk.

## 2017-07-14 NOTE — Telephone Encounter (Signed)
Patient calling the office for samples of medication:   1.  What medication and dosage are you requesting samples for? Xarelto   2.  Are you currently out of this medication? Just enough for this week

## 2017-07-20 ENCOUNTER — Ambulatory Visit (INDEPENDENT_AMBULATORY_CARE_PROVIDER_SITE_OTHER): Payer: Medicare Other | Admitting: Cardiovascular Disease

## 2017-07-20 ENCOUNTER — Encounter: Payer: Self-pay | Admitting: Cardiovascular Disease

## 2017-07-20 VITALS — BP 114/66 | HR 63 | Ht 63.0 in | Wt 170.0 lb

## 2017-07-20 DIAGNOSIS — I472 Ventricular tachycardia: Secondary | ICD-10-CM

## 2017-07-20 DIAGNOSIS — Z950 Presence of cardiac pacemaker: Secondary | ICD-10-CM | POA: Diagnosis not present

## 2017-07-20 DIAGNOSIS — I495 Sick sinus syndrome: Secondary | ICD-10-CM

## 2017-07-20 DIAGNOSIS — Z7901 Long term (current) use of anticoagulants: Secondary | ICD-10-CM | POA: Diagnosis not present

## 2017-07-20 DIAGNOSIS — I4891 Unspecified atrial fibrillation: Secondary | ICD-10-CM

## 2017-07-20 DIAGNOSIS — Z86711 Personal history of pulmonary embolism: Secondary | ICD-10-CM

## 2017-07-20 DIAGNOSIS — I4729 Other ventricular tachycardia: Secondary | ICD-10-CM

## 2017-07-20 NOTE — Progress Notes (Signed)
Cardiology Office Note:    Date:  07/21/2017   ID:  Raven Gray, DOB [birth year], MRN [id number]  PCP:  [name], MD  Cardiologist:  Sanda Klein, MD ; Quay Burow, M.D.    Referring MD: Anda Kraft, MD   Chief Complaint  Patient presents with  . Follow-up    hospital followup and pacer check   History of Present Illness:    Raven Gray is a 83 y.o. female with a hx of Atrial fibrillation, remote DVT/pulmonary embolism on chronic Xarelto anticoagulation, hypertension, hyperlipidemia, COPD with recently diagnosed atrial fibrillation, associated with slow ventricular response and pauses. She has had at least one episode of syncope. Had pacemaker implantation about 3 months ago.  Echocardiogram and nuclear stress test did not show evidence of left ventricular dysfunction or significant coronary insufficiency.   Hospitalized 2 weeks ago with pneumonia and sepsis and has improved after antibiotics.  Torsemide was held temporarily and has been restarted.  Heart failure exacerbation was not a problem during that hospitalization.  She has not had any bleeding problems or neurological events while on treatment with Xarelto.  Except for the problems in the last couple of weeks, she has felt better following pacemaker implantation.  She has not had any episodes of syncope or near syncope since the device was placed.  Vice interrogation shows normal function.  She has a Medtronic Azure single chamber MRI conditional device.  Current settings estimated device longevity is 14.8 years.  She has 42% ventricular pacing and 58% sensed rhythm.  Heart rate histogram distribution is good.  Activity level is on the average less than 1 hour and even less than that during the episode of pneumonia recently.  Her device has recorded a couple of episodes of rapid ventricular response, but for the most part rate control is excellent.  She did have a 10 beat asymptomatic run of nonsustained VT on July 30 (the  intracardiac EGM morphology is clearly different from the rhythm during atrial fibrillation with RVR)  Past Medical History:  Diagnosis Date  . Atrial fibrillation ([place])    a. on Xarelto  . Chest pain   . COPD (chronic obstructive pulmonary disease) ([place])    family unsure if is copd,   . H/O echocardiogram    a. 08/2016: EF of 60-65%, severely dilated LA, mild pulmonic regurgitations, mild TR, and PA Pressure at 38 mm Hg  . History of depression   . History of DVT (deep vein thrombosis)   . History of pulmonary embolism   . Hyperlipidemia   . Hypertension   . Hypokalemia    Now improved with treatment  . Shortness of breath   . Stroke ([place])   . Tachy-brady syndrome ([place]) 04/01/2017   s/p MDT PPM     Past Surgical History:  Procedure Laterality Date  . PACEMAKER IMPLANT N/A 04/01/2017   Procedure: Pacemaker Implant;  Surgeon: Sanda Klein, MD;  Location: [facility];  Service: Cardiovascular;  Laterality: N/A; MDT Azure XT SR MRI  . REPLACEMENT TOTAL KNEE    . ROTATOR CUFF REPAIR      Current Medications: Current Meds  Medication Sig  . acetaminophen (TYLENOL) 325 MG tablet Take 650 mg by mouth every 6 (six) hours as needed (for pain).   Marland Kitchen ALPRAZolam (XANAX) 0.25 MG tablet Take 1 tablet (0.25 mg total) by mouth 2 (two) times daily as needed for anxiety.  . furosemide (LASIX) 40 MG tablet Take 1 tablet (40 mg total)  by mouth every morning.  . metoprolol (LOPRESSOR) 25 MG tablet Take 1 tablet (25 mg total) by mouth 2 (two) times daily.  . Multiple Vitamins-Minerals (WOMENS 50+ MULTI VITAMIN/MIN) TABS Take 1 tablet by mouth daily.  . nitroGLYCERIN (NITROSTAT) 0.4 MG SL tablet Place 0.4 mg under the tongue as needed for chest pain. X 3 doses  . omeprazole (PRILOSEC) 20 MG capsule Take 20 mg by mouth daily as needed (for reflux/heartburn).   . OXYGEN Inhale 2 L into the lungs continuous.   . potassium chloride SA (K-DUR,KLOR-CON) 20 MEQ tablet Take 40 mEq by mouth daily.  Take 2 tablets every morning  . pravastatin (PRAVACHOL) 40 MG tablet Take 40 mg by mouth every morning.   . Rivaroxaban (XARELTO) 15 MG TABS tablet Take 1 tablet (15 mg total) by mouth every morning.  . venlafaxine XR (EFFEXOR-XR) 150 MG 24 hr capsule Take 150 mg by mouth daily.  Marland Kitchen venlafaxine XR (EFFEXOR-XR) 75 MG 24 hr capsule Take 1 capsule by mouth daily.  Marland Kitchen zolpidem (AMBIEN) 5 MG tablet Take 5 mg by mouth at bedtime.     Allergies:   Patient has no known allergies.   Social History   Social History  . Marital status: Widowed    Spouse name: N/A  . Number of children: N/A  . Years of education: N/A   Social History Main Topics  . Smoking status: Never Smoker  . Smokeless tobacco: Never Used  . Alcohol use No  . Drug use: No  . Sexual activity: No   Other Topics Concern  . None   Social History Narrative  . None     Family History: The patient's family history includes Alzheimer's disease in her mother; Diabetes type II in her daughter; Heart attack in her father; Heart disease in her daughter. ROS:   Please see the history of present illness.     All other systems reviewed and are negative.  EKGs/Labs/Other Studies Reviewed:     EKG:  EKG is not ordered today.   Recent Labs: 01/09/2017: Magnesium 1.7 07/01/2017: B Natriuretic Peptide 555.2 07/02/2017: ALT 18 07/04/2017: Hemoglobin 11.3; Platelets 249 07/05/2017: BUN 16; Creatinine, Ser 0.84; Potassium 3.7; Sodium 139   Recent Lipid Panel    Component Value Date/Time   CHOL 115 01/09/2017 0356   TRIG 86 01/09/2017 0356   HDL 40 (L) 01/09/2017 0356   CHOLHDL 2.9 01/09/2017 0356   VLDL 17 01/09/2017 0356   LDLCALC 58 01/09/2017 0356    Physical Exam:    VS:  BP 114/66   Pulse 63   Ht 5\' 3"  (1.6 m)   Wt 170 lb (77.1 kg)   BMI 30.11 kg/m     Wt Readings from Last 3 Encounters:  07/20/17 170 lb (77.1 kg)  07/05/17 175 lb 4.8 oz (79.5 kg)  04/02/17 180 lb (81.6 kg)      General: Alert, oriented  x3, no distress, looks a little frail Head: no evidence of trauma, PERRL, EOMI, no exophtalmos or lid lag, no myxedema, no xanthelasma; normal ears, nose and oropharynx Neck: normal jugular venous pulsations and no hepatojugular reflux; brisk carotid pulses without delay and no carotid bruits Chest: clear to auscultation, no signs of consolidation by percussion or palpation, normal fremitus, symmetrical and full respiratory excursions Cardiovascular: normal position and quality of the apical impulse, Irregular, 2/6 early peaking systolic ejection murmur in the aortic focus, no diastolic murmurs, rubs or gallops.  Subclavian pacer site is well-healed Abdomen: no  tenderness or distention, no masses by palpation, no abnormal pulsatility or arterial bruits, normal bowel sounds, no hepatosplenomegaly Extremities: no clubbing, cyanosis or edema; 2+ radial, ulnar and brachial pulses bilaterally; 2+ right femoral, posterior tibial and dorsalis pedis pulses; 2+ left femoral, posterior tibial and dorsalis pedis pulses; no subclavian or femoral bruits Neurological: grossly nonfocal Psych: Normal mood and affect   ASSESSMENT:    1. Atrial fibrillation with slow ventricular response ([place])   2. Long term current use of anticoagulant   3. History of pulmonary embolism   4. Pacemaker   5. Nonsustained ventricular tachycardia (HCC)    PLAN:    In order of problems listed above:  1.  AFib: Presumably had syncope due to episodes of very slow ventricular response/pauses.  She is not on any rate control medication other than a low-dose of beta-blocker. 2. Xarelto: Has not had any bleeding on chronic Xarelto anticoagulation. 3. Pulmonary embolism diagnosed in 2013, no recent symptoms to suggest active DVT/PE. 4. PPM: Device function.  No reprogramming changes are necessary today. 5. NSVT: only had one episode incidentally recorded by pacemaker, asymptomatic.  Unclear if this is clinically relevant.  Continue to  monitor via her device.  She is on a low-dose of beta-blocker.    Medication Adjustments/Labs and Tests Ordered: Current medicines are reviewed at length with the patient today.  Concerns regarding medicines are outlined above. Labs and tests ordered and medication changes are outlined in the patient instructions below:  Patient Instructions  Dr Sallyanne Kuster recommends that you continue on your current medications as directed. Please refer to the Current Medication list given to you today.  Remote monitoring is used to monitor your Pacemaker or ICD from home. This monitoring reduces the number of office visits required to check your device to one time per year. It allows [place] to keep an eye on the functioning of your device to ensure it is working properly. You are scheduled for a device check from home on Monday, January 28th, 2019. You may send your transmission at any time that day. If you have a wireless device, the transmission will be sent automatically. After your physician reviews your transmission, you will receive a notification with your next transmission date.  Dr Sallyanne Kuster recommends that you schedule a follow-up appointment in 12 months with a pacemaker check. You will receive a reminder letter in the mail two months in advance. If you don't receive a letter, please call our office to schedule the follow-up appointment.  If you need a refill on your cardiac medications before your next appointment, please call your pharmacy.    Signed, Sanda Klein, MD  07/21/2017 3:17 PM    [facility]

## 2017-07-20 NOTE — Patient Instructions (Signed)

## 2017-07-21 DIAGNOSIS — I472 Ventricular tachycardia: Secondary | ICD-10-CM | POA: Insufficient documentation

## 2017-07-21 DIAGNOSIS — E118 Type 2 diabetes mellitus with unspecified complications: Secondary | ICD-10-CM | POA: Diagnosis not present

## 2017-07-21 DIAGNOSIS — I4729 Other ventricular tachycardia: Secondary | ICD-10-CM | POA: Insufficient documentation

## 2017-07-21 DIAGNOSIS — Z09 Encounter for follow-up examination after completed treatment for conditions other than malignant neoplasm: Secondary | ICD-10-CM | POA: Diagnosis not present

## 2017-07-21 DIAGNOSIS — Z86711 Personal history of pulmonary embolism: Secondary | ICD-10-CM | POA: Insufficient documentation

## 2017-07-21 DIAGNOSIS — I10 Essential (primary) hypertension: Secondary | ICD-10-CM | POA: Diagnosis not present

## 2017-07-25 DIAGNOSIS — J449 Chronic obstructive pulmonary disease, unspecified: Secondary | ICD-10-CM | POA: Diagnosis not present

## 2017-07-28 DIAGNOSIS — Z9981 Dependence on supplemental oxygen: Secondary | ICD-10-CM | POA: Diagnosis not present

## 2017-07-28 DIAGNOSIS — J189 Pneumonia, unspecified organism: Secondary | ICD-10-CM | POA: Diagnosis not present

## 2017-07-28 DIAGNOSIS — E118 Type 2 diabetes mellitus with unspecified complications: Secondary | ICD-10-CM | POA: Diagnosis not present

## 2017-07-28 DIAGNOSIS — I480 Paroxysmal atrial fibrillation: Secondary | ICD-10-CM | POA: Diagnosis not present

## 2017-07-30 DIAGNOSIS — J449 Chronic obstructive pulmonary disease, unspecified: Secondary | ICD-10-CM | POA: Diagnosis not present

## 2017-08-17 ENCOUNTER — Telehealth: Payer: Self-pay | Admitting: Cardiovascular Disease

## 2017-08-17 NOTE — Telephone Encounter (Signed)
New message ° ° ° °Patient calling the office for samples of medication: ° ° °1.  What medication and dosage are you requesting samples for? Rivaroxaban (XARELTO) 15 MG TABS tablet ° °2.  Are you currently out of this medication? yes ° ° °

## 2017-08-17 NOTE — Telephone Encounter (Signed)
Medication Samples have been provided to the patient.  Drug name: Xarelto 15mg Qty: 21LOT: [id number].Date: 12/20  The patient has been instructed regarding the correct time, dose, and frequency of taking this medication, including desired effects and most common side effects.

## 2017-08-18 DIAGNOSIS — J189 Pneumonia, unspecified organism: Secondary | ICD-10-CM | POA: Diagnosis not present

## 2017-08-20 LAB — CUP PACEART INCLINIC DEVICE CHECK
Battery Voltage: 3.17 V
Implantable Lead Implant Date: 20180711
Implantable Lead Model: 5076
Implantable Pulse Generator Implant Date: 20180711
Lead Channel Impedance Value: 361 Ohm
Lead Channel Pacing Threshold Amplitude: 0.75 V
Lead Channel Pacing Threshold Pulse Width: 0.4 ms
Lead Channel Sensing Intrinsic Amplitude: 4.5 mV
Lead Channel Sensing Intrinsic Amplitude: 4.5 mV
Lead Channel Setting Pacing Pulse Width: 0.4 ms
MDC IDC LEAD LOCATION: 753860
MDC IDC MSMT BATTERY REMAINING LONGEVITY: 178 mo
MDC IDC MSMT LEADCHNL RV IMPEDANCE VALUE: 513 Ohm
MDC IDC SESS DTM: 20181029140951
MDC IDC SET LEADCHNL RV PACING AMPLITUDE: 2 V
MDC IDC SET LEADCHNL RV SENSING SENSITIVITY: 0.9 mV
MDC IDC STAT BRADY RV PERCENT PACED: 41.78 %

## 2017-08-24 DIAGNOSIS — J449 Chronic obstructive pulmonary disease, unspecified: Secondary | ICD-10-CM | POA: Diagnosis not present

## 2017-08-29 DIAGNOSIS — J449 Chronic obstructive pulmonary disease, unspecified: Secondary | ICD-10-CM | POA: Diagnosis not present

## 2017-09-03 ENCOUNTER — Telehealth: Payer: Self-pay | Admitting: Cardiovascular Disease

## 2017-09-03 NOTE — Telephone Encounter (Signed)
Patient made aware that samples are available at the front.  Medication Samples have been provided to the patient.  Drug name: Xarelto        Strength: 15 mg          Qty: 2 bottles   LOT: [id number]   Exp.Date: 12/20

## 2017-09-03 NOTE — Telephone Encounter (Signed)
New Message       1.  What medication and dosage are you requesting samples for?   Rivaroxaban (XARELTO) 15 MG TABS 2.  Are you currently out of this medication? No, she has enough until Monday.

## 2017-09-10 DIAGNOSIS — G4700 Insomnia, unspecified: Secondary | ICD-10-CM | POA: Diagnosis not present

## 2017-09-10 DIAGNOSIS — J40 Bronchitis, not specified as acute or chronic: Secondary | ICD-10-CM | POA: Diagnosis not present

## 2017-09-17 ENCOUNTER — Telehealth: Payer: Self-pay | Admitting: Cardiovascular Disease

## 2017-09-17 MED ORDER — RIVAROXABAN 15 MG PO TABS: 15.0000 mg | ORAL_TABLET | Freq: Every morning | ORAL | 0 refills | Status: DC

## 2017-09-17 NOTE — Telephone Encounter (Signed)
Patient calling the office for samples of medication:   1.  What medication and dosage are you requesting samples for? Xarelto 15mg    2.  Are you currently out of this medication? Has enough until Monday

## 2017-09-17 NOTE — Telephone Encounter (Signed)
SAMPLES AVAILABLE FOR PICK UP

## 2017-09-24 DIAGNOSIS — J449 Chronic obstructive pulmonary disease, unspecified: Secondary | ICD-10-CM | POA: Diagnosis not present

## 2017-09-25 ENCOUNTER — Encounter: Payer: Self-pay | Admitting: Cardiovascular Disease

## 2017-09-25 ENCOUNTER — Ambulatory Visit: Payer: Medicare Other | Admitting: Cardiovascular Disease

## 2017-09-25 DIAGNOSIS — E7800 Pure hypercholesterolemia, unspecified: Secondary | ICD-10-CM | POA: Diagnosis not present

## 2017-09-25 DIAGNOSIS — I10 Essential (primary) hypertension: Secondary | ICD-10-CM | POA: Diagnosis not present

## 2017-09-25 DIAGNOSIS — I495 Sick sinus syndrome: Secondary | ICD-10-CM | POA: Diagnosis not present

## 2017-09-25 DIAGNOSIS — I4891 Unspecified atrial fibrillation: Secondary | ICD-10-CM | POA: Diagnosis not present

## 2017-09-25 NOTE — Assessment & Plan Note (Signed)
History of hyperlipidemia on statin therapy with lipid profile performed 01/09/17 revealing a total cholesterol of 115, LDL 58 and HDL of 40.

## 2017-09-25 NOTE — Assessment & Plan Note (Signed)
History of atrial fib ablation with long pauses on Xareto status post permanent transvenous pacemaker insertion by Dr. Sallyanne Kuster 04/01/17.

## 2017-09-25 NOTE — Assessment & Plan Note (Signed)
History of essential hypertension blood pressure measures at 125/70. She is on metoprolol. Continue current meds at current dosing

## 2017-09-25 NOTE — Assessment & Plan Note (Signed)
History of tachybradycardia syndrome with A. fib and long pauses status post permanent transvenous pacemaker insertion by Dr. Sallyanne Kuster 04/01/17. She was having syncopal episodes prior to this which have resolved.

## 2017-09-25 NOTE — Patient Instructions (Signed)
Medication Instructions: Your physician recommends that you continue on your current medications as directed. Please refer to the Current Medication list given to you today.   Follow-Up: Your physician wants you to follow-up in: March 2020 with Dr. Gwenlyn Found. You will receive a reminder letter in the mail two months in advance. If you don't receive a letter, please call our office to schedule the follow-up appointment.  If you need a refill on your cardiac medications before your next appointment, please call your pharmacy.

## 2017-09-25 NOTE — Progress Notes (Signed)
09/25/2017 ATHINA FAHEY   1927/10/09  [id number]  Primary Physician [name], MD Primary Cardiologist: Lorretta Harp MD [name], [place]  HPI:  Raven Gray is a 84 y.o.  moderately overweight married Caucasian female mother of 4 daughters, grandmother to 70 grandchildren was accompanied by one of her daughters [name] today. I last saw her in the office 02/04/17.. She unfortunately lost her oldest daughter June 2016 2 diabetes.She was referred by Dr. Wilson Singer for new-onset A. Fib and chest pain. The patient has a remote history of a pulmonary embolism and DVT on Xarelto oral autoregulation. Problems include hypertension, hyperlipidemia and COPD. She does have a family history of heart disease in the father who died of an MI. She does have a daughter who's had stents. She has never had a heart attack or stroke. Over the last month she said it was a chest pain which awakened her from sleep. She also feels weak and sluggish. An EKG performed by her primary care physician revealed atrial fibrillation which was newly recognized. I performed a 2-D echo cardiogram which was normal as was a Myoview stress test. She has had several ER visits/admissions for atypical chest pain and shortness of breath. A recent 2-D echo performed 10/21/14 revealed normal LV size and function, moderate aortic insufficiency with a mildly dilated left atrium. We have discussed the possibility of outpatient cardioversion however the patient does not wish to pursue this at this time. Because of ongoing chest pain she had a Myoview stress test performed 02/22/16 which was low risk. Because of ongoing chest pain which does have ischemic discuss the possibility of performing outpatient cardiac catheterization which the patient currently does not wish to pursue. As result of that I elected to increase her Imdur from 30-60 mg a day and will follow her clinically closely as an outpatient. I performed event monitoring which showed A. fib with  long pauses. She also had a permanent transvenous pacemaker placed by Dr. Sallyanne Kuster 04/01/17. She does get occasional chest pain but does not wish to have any further testing for this nor does she wish to have anything interventional done. She lives alone and has family support.     Current Meds  Medication Sig  . acetaminophen (TYLENOL) 325 MG tablet Take 650 mg by mouth every 6 (six) hours as needed (for pain).   Marland Kitchen ALPRAZolam (XANAX) 0.25 MG tablet Take 1 tablet (0.25 mg total) by mouth 2 (two) times daily as needed for anxiety.  . furosemide (LASIX) 40 MG tablet Take 1 tablet (40 mg total) by mouth every morning.  . metoprolol (LOPRESSOR) 25 MG tablet Take 1 tablet (25 mg total) by mouth 2 (two) times daily.  . Multiple Vitamins-Minerals (WOMENS 50+ MULTI VITAMIN/MIN) TABS Take 1 tablet by mouth daily.  . nitroGLYCERIN (NITROSTAT) 0.4 MG SL tablet Place 0.4 mg under the tongue as needed for chest pain. X 3 doses  . omeprazole (PRILOSEC) 20 MG capsule Take 20 mg by mouth daily as needed (for reflux/heartburn).   . OXYGEN Inhale 2 L into the lungs continuous.   . potassium chloride SA (K-DUR,KLOR-CON) 20 MEQ tablet Take 40 mEq by mouth daily. Take 2 tablets every morning  . pravastatin (PRAVACHOL) 40 MG tablet Take 40 mg by mouth every morning.   . Rivaroxaban (XARELTO) 15 MG TABS tablet Take 1 tablet (15 mg total) by mouth every morning.  . venlafaxine XR (EFFEXOR-XR) 150 MG 24 hr capsule Take 150 mg by  mouth daily.  Marland Kitchen venlafaxine XR (EFFEXOR-XR) 75 MG 24 hr capsule Take 1 capsule by mouth daily.  Marland Kitchen zolpidem (AMBIEN) 5 MG tablet Take 5 mg by mouth at bedtime.     No Known Allergies  Social History   Socioeconomic History  . Marital status: Widowed    Spouse name: Not on file  . Number of children: Not on file  . Years of education: Not on file  . Highest education level: Not on file  Social Needs  . Financial resource strain: Not on file  . Food insecurity - worry: Not on file  .  Food insecurity - inability: Not on file  . Transportation needs - medical: Not on file  . Transportation needs - non-medical: Not on file  Occupational History  . Not on file  Tobacco Use  . Smoking status: Never Smoker  . Smokeless tobacco: Never Used  Substance and Sexual Activity  . Alcohol use: No  . Drug use: No  . Sexual activity: No  Other Topics Concern  . Not on file  Social History Narrative  . Not on file     Review of Systems: General: negative for chills, fever, night sweats or weight changes.  Cardiovascular: negative for chest pain, dyspnea on exertion, edema, orthopnea, palpitations, paroxysmal nocturnal dyspnea or shortness of breath Dermatological: negative for rash Respiratory: negative for cough or wheezing Urologic: negative for hematuria Abdominal: negative for nausea, vomiting, diarrhea, bright red blood per rectum, melena, or hematemesis Neurologic: negative for visual changes, syncope, or dizziness All other systems reviewed and are otherwise negative except as noted above.    Blood pressure 125/70, pulse 66, SpO2 94 %.  General appearance: alert and no distress Neck: no adenopathy, no carotid bruit, no JVD, supple, symmetrical, trachea midline and thyroid not enlarged, symmetric, no tenderness/mass/nodules Lungs: clear to auscultation bilaterally Heart: regular rate and rhythm, S1, S2 normal, no murmur, click, rub or gallop Extremities: extremities normal, atraumatic, no cyanosis or edema Pulses: 2+ and symmetric Skin: Skin color, texture, turgor normal. No rashes or lesions Neurologic: Alert and oriented X 3, normal strength and tone. Normal symmetric reflexes. Normal coordination and gait  EKG not performed today  ASSESSMENT AND PLAN:   Atrial fibrillation with slow ventricular response (HCC) History of atrial fib ablation with long pauses on Xareto status post permanent transvenous pacemaker insertion by Dr. Sallyanne Kuster  04/01/17.  Hyperlipidemia History of hyperlipidemia on statin therapy with lipid profile performed 01/09/17 revealing a total cholesterol of 115, LDL 58 and HDL of 40.  Essential hypertension History of essential hypertension blood pressure measures at 125/70. She is on metoprolol. Continue current meds at current dosing  Tachycardia-bradycardia syndrome ([place]) History of tachybradycardia syndrome with A. fib and long pauses status post permanent transvenous pacemaker insertion by Dr. Sallyanne Kuster 04/01/17. She was having syncopal episodes prior to this which have resolved.      Lorretta Harp MD FACP,FACC,FAHA, [facility] 09/25/2017 10:16 AM

## 2017-09-29 DIAGNOSIS — J449 Chronic obstructive pulmonary disease, unspecified: Secondary | ICD-10-CM | POA: Diagnosis not present

## 2017-10-07 ENCOUNTER — Ambulatory Visit
Admission: RE | Admit: 2017-10-07 | Discharge: 2017-10-07 | Disposition: A | Discharge status: Home / Self Care | Payer: Medicare Other | Source: Ambulatory Visit | Attending: Surgery | Admitting: Surgery

## 2017-10-07 ENCOUNTER — Ambulatory Visit (INDEPENDENT_AMBULATORY_CARE_PROVIDER_SITE_OTHER): Payer: Medicare Other | Admitting: Surgery

## 2017-10-07 ENCOUNTER — Ambulatory Visit (INDEPENDENT_AMBULATORY_CARE_PROVIDER_SITE_OTHER): Payer: Medicare Other

## 2017-10-07 ENCOUNTER — Encounter (INDEPENDENT_AMBULATORY_CARE_PROVIDER_SITE_OTHER): Payer: Self-pay | Admitting: Surgery

## 2017-10-07 ENCOUNTER — Telehealth: Payer: Self-pay | Admitting: Cardiovascular Disease

## 2017-10-07 VITALS — Ht 63.0 in | Wt 170.0 lb

## 2017-10-07 DIAGNOSIS — M5442 Lumbago with sciatica, left side: Secondary | ICD-10-CM

## 2017-10-07 DIAGNOSIS — R296 Repeated falls: Secondary | ICD-10-CM | POA: Diagnosis not present

## 2017-10-07 NOTE — Telephone Encounter (Signed)
New message   Patient calling the office for samples of medication:   1.  What medication and dosage are you requesting samples for?Rivaroxaban (XARELTO) 15 MG TABS tablet  2.  Are you currently out of this medication? no

## 2017-10-07 NOTE — Progress Notes (Signed)
Office Visit Note   Patient: Raven Gray           Date of Birth: 1928-09-15           MRN: [id number] Visit Date: 10/07/2017              Requested by: [name], MD [address] [place], [place] [id number] PCP: [name], MD   Assessment & Plan: Visit Diagnoses:  1. Acute left-sided low back pain with left-sided sciatica     Plan:   Follow-Up Instructions: No Follow-up on file.   Orders:  Orders Placed This Encounter  Procedures   XR Lumbar Spine 2-3 Views   CT LUMBAR SPINE WO CONTRAST   No orders of the defined types were placed in this encounter.     Procedures: No procedures performed   Clinical Data: No additional findings.   Subjective: Chief Complaint  Patient presents with   Lower Back - Pain    Acute pain for past 3 days with radicular symptoms down left thigh     HPI  Review of Systems   Objective: Vital Signs: Ht 5\' 3"  (1.6 m)   Wt 170 lb (77.1 kg)   BMI 30.11 kg/m   Physical Exam  Ortho Exam  Specialty Comments:  No specialty comments available.  Imaging: No results found.   PMFS History: Patient Active Problem List   Diagnosis Date Noted   History of pulmonary embolism 07/21/2017   Nonsustained ventricular tachycardia ([place]) 07/21/2017   Healthcare-associated pneumonia 07/01/2017   Acute respiratory failure with hypoxia ([place]) 54/00/8676   Acute diastolic heart failure ([place]) 07/01/2017   Conjunctivitis 07/01/2017   Sepsis ([place]) 07/01/2017   HCAP (healthcare-associated pneumonia)    Acute pulmonary edema ([place])    Tachycardia-bradycardia syndrome ([place]) 04/01/2017   Pacemaker 04/01/2017   Chronic atrial fibrillation ([place])    Syncope and collapse    Loss of consciousness ([place]) 01/08/2017   Dehydration 11/25/2016   Acute encephalopathy    Encephalopathy 11/24/2016   Dyspnea on exertion 10/29/2016   Chronic respiratory failure with hypoxia ([place]) 10/29/2016   Long term current use of anticoagulant 10/23/2016    TIA (transient ischemic attack) 09/06/2016   COPD (chronic obstructive pulmonary disease) ([place]) 09/06/2016   Essential hypertension 09/06/2016   GERD (gastroesophageal reflux disease) 09/06/2016   Depression 09/06/2016   Hyperlipidemia 10/23/2015   Aortic insufficiency 12/15/2014   Edema of both legs 09/19/2014   Atrial fibrillation with slow ventricular response ([place]) 11/21/2013   Chest pain 11/21/2013   Community acquired pneumonia 06/19/2012   Pulmonary embolism ([place]) 06/19/2012   Hypokalemia 06/18/2012   COPD with acute exacerbation ([place]) 06/18/2012   Hypoxia 06/18/2012   Past Medical History:  Diagnosis Date   Atrial fibrillation ([place])    a. on Xarelto   Chest pain    COPD (chronic obstructive pulmonary disease) ([place])    family unsure if is copd,    H/O echocardiogram    a. 08/2016: EF of 60-65%, severely dilated LA, mild pulmonic regurgitations, mild TR, and PA Pressure at 38 mm Hg   History of depression    History of DVT (deep vein thrombosis)    History of pulmonary embolism    Hyperlipidemia    Hypertension    Hypokalemia    Now improved with treatment   Shortness of breath    Stroke ([place])    Tachy-brady syndrome ([place]) 04/01/2017   s/p MDT PPM     Family History  Problem  Relation Age of Onset   Alzheimer's disease Mother    Heart attack Father    Diabetes type II Daughter    Heart disease Daughter        stents    Past Surgical History:  Procedure Laterality Date   PACEMAKER IMPLANT N/A 04/01/2017   Procedure: Pacemaker Implant;  Surgeon: Sanda Klein, MD;  Location: [facility];  Service: Cardiovascular;  Laterality: N/A; MDT Azure XT SR MRI   REPLACEMENT TOTAL KNEE     ROTATOR CUFF REPAIR     Social History   Occupational History   Not on file  Tobacco Use   Smoking status: Never Smoker   Smokeless tobacco: Never Used  Substance and Sexual Activity   Alcohol use: No   Drug use: No   Sexual activity: No

## 2017-10-07 NOTE — Telephone Encounter (Signed)
Medication samples have been provided to the patient.  Drug name: xarelto 15mg    Qty: 2  LOT: [id number]  Exp.Date: 4/21 Qty: 1  LOT: [id number]  Exp.Date: 2/21  Samples left at front desk for patient pick-up. Patient notified.  Sheral Apley M 3:45 PM 10/07/2017

## 2017-10-08 ENCOUNTER — Telehealth (INDEPENDENT_AMBULATORY_CARE_PROVIDER_SITE_OTHER): Payer: Self-pay | Admitting: Surgery

## 2017-10-08 NOTE — Telephone Encounter (Signed)
Please call pt daughter  to dicuss CT scan. Pt is in a lot of pain

## 2017-10-08 NOTE — Telephone Encounter (Signed)
Please call pt daughter  to dicuss CT scan. Pt is in a lot of pain.

## 2017-10-09 ENCOUNTER — Telehealth (INDEPENDENT_AMBULATORY_CARE_PROVIDER_SITE_OTHER): Payer: Self-pay | Admitting: Orthopaedic Surgery

## 2017-10-09 NOTE — Telephone Encounter (Signed)
Advised Dr. Lorin Mercy reviewed CT scan. No acute fracture. Multilevel stenosis with some progressive changes since last study 2016. Does show a  kidney stone.  Not sure if this could be contributing to her symptoms. She can go to the urgent care or emergency room and they can work up with urinalysis and possible blood work.

## 2017-10-09 NOTE — Telephone Encounter (Signed)
Patient's daughter called stating that her mother is in a lot of pain and would like to get the results of her CT.  CB#[phone]

## 2017-10-09 NOTE — Telephone Encounter (Signed)
I called and spoke with patient's daughter and advised. She expressed understanding and states that she is going to see what she can do to get her mom checked out.

## 2017-10-09 NOTE — Telephone Encounter (Signed)
Patients daughter called stating that at the last appt Jeneen Rinks would not give an injection because there could be a break so patient was sent to get an urgent CT and that as soon as he got the results they would be hearing. She is getting frustrated because nobody will tell her the results and her mother is in a lot of pain and according to their last appointment it seemed to be an urgent situation. If its not a break she would like to know if something can be prescribed, Tylenol isnt helping and she said it needs to be something she can take because she is on Xarelto? Please advise patient today if possible. CB # [id number]

## 2017-10-09 NOTE — Telephone Encounter (Signed)
Please advise 

## 2017-10-13 ENCOUNTER — Other Ambulatory Visit: Payer: Self-pay

## 2017-10-13 ENCOUNTER — Other Ambulatory Visit: Payer: Medicare Other

## 2017-10-13 ENCOUNTER — Emergency Department (HOSPITAL_COMMUNITY)
Admission: EM | Admit: 2017-10-13 | Discharge: 2017-10-13 | Disposition: A | Discharge status: Home / Self Care | Payer: Medicare Other | Attending: Emergency Medicine | Admitting: Emergency Medicine

## 2017-10-13 ENCOUNTER — Encounter (HOSPITAL_COMMUNITY): Payer: Self-pay

## 2017-10-13 DIAGNOSIS — I110 Hypertensive heart disease with heart failure: Secondary | ICD-10-CM | POA: Insufficient documentation

## 2017-10-13 DIAGNOSIS — I5031 Acute diastolic (congestive) heart failure: Secondary | ICD-10-CM | POA: Diagnosis not present

## 2017-10-13 DIAGNOSIS — R531 Weakness: Secondary | ICD-10-CM | POA: Diagnosis not present

## 2017-10-13 DIAGNOSIS — Z96659 Presence of unspecified artificial knee joint: Secondary | ICD-10-CM | POA: Insufficient documentation

## 2017-10-13 DIAGNOSIS — R230 Cyanosis: Secondary | ICD-10-CM | POA: Insufficient documentation

## 2017-10-13 DIAGNOSIS — R404 Transient alteration of awareness: Secondary | ICD-10-CM | POA: Diagnosis not present

## 2017-10-13 DIAGNOSIS — Z7901 Long term (current) use of anticoagulants: Secondary | ICD-10-CM | POA: Diagnosis not present

## 2017-10-13 DIAGNOSIS — I4891 Unspecified atrial fibrillation: Secondary | ICD-10-CM | POA: Diagnosis not present

## 2017-10-13 DIAGNOSIS — Z950 Presence of cardiac pacemaker: Secondary | ICD-10-CM | POA: Diagnosis not present

## 2017-10-13 DIAGNOSIS — J449 Chronic obstructive pulmonary disease, unspecified: Secondary | ICD-10-CM | POA: Diagnosis not present

## 2017-10-13 DIAGNOSIS — Z79899 Other long term (current) drug therapy: Secondary | ICD-10-CM | POA: Insufficient documentation

## 2017-10-13 DIAGNOSIS — Z7902 Long term (current) use of antithrombotics/antiplatelets: Secondary | ICD-10-CM | POA: Insufficient documentation

## 2017-10-13 NOTE — ED Notes (Signed)
ED Provider at bedside. 

## 2017-10-13 NOTE — ED Notes (Signed)
Pt ambulated in hallway and had smooth and steady gait.  No SOB or complaints. Once in bed pt sats noted to be 100% on RA.

## 2017-10-13 NOTE — Discharge Instructions (Signed)
There were no acute abnormalities noted on exam today.  Please follow-up with your primary care provider on this matter.  Return to the ED as needed.

## 2017-10-13 NOTE — ED Triage Notes (Signed)
[facility]- pt coming from home with complaint of cyanosis. Pt denies any respiratory distress but has blue coloring to finger tips. Unable to obtain an o2 saturation, cap refill >5seconds, vitals stable otherwise.

## 2017-10-13 NOTE — ED Provider Notes (Signed)
She fell  today sustaining no injury.  She normally walks with a walker she is just not certain if she was using her walker when she fell.  She denies pain anywhere.  She denies syncope.  Her family members noted that her feet were blue.  On exam she is in no distress alert Glasgow Coma Score 15..  All 4 extremities redness swelling or tenderness.  No cyanosis.  Radial pulses 2+ bilaterally DP pulses 2+ bilaterally.  Good capillary refill   Orlie Dakin, MD 10/13/17 2103

## 2017-10-13 NOTE — ED Provider Notes (Signed)
[facility] EMERGENCY DEPARTMENT Provider Note   CSN: [id number] Arrival date & time: 10/13/17  1857     History   Chief Complaint Chief Complaint  Patient presents with  . Cyanosis    HPI Raven Gray is a 84 y.o. female.  HPI   Raven Gray is a 84 y.o. female, with a history of A. fib, COPD, DVT, HTN, and stroke, presenting to the ED with blue feet.  States she fell yesterday and today, but does not know what caused it. She pressed her life alert button, family members came to check on her, and helped her to the toilet. At that time, they noted patient's toes were blue bilaterally. States she has "blue-red" hands regularly.  She has had recent falls, but states this has been happening for several months and this is being evaluated by her PCP and a neurologist. Denies fever/chills, recent illness, chest pain, shortness of breath, dizziness, loss of consciousness, neuro deficits, extremity pain, or any other complaints.   Past Medical History:  Diagnosis Date  . Atrial fibrillation ([place])    a. on Xarelto  . Chest pain   . COPD (chronic obstructive pulmonary disease) ([place])    family unsure if is copd,   . H/O echocardiogram    a. 08/2016: EF of 60-65%, severely dilated LA, mild pulmonic regurgitations, mild TR, and PA Pressure at 38 mm Hg  . History of depression   . History of DVT (deep vein thrombosis)   . History of pulmonary embolism   . Hyperlipidemia   . Hypertension   . Hypokalemia    Now improved with treatment  . Shortness of breath   . Stroke ([place])   . Tachy-brady syndrome ([place]) 04/01/2017   s/p MDT PPM     Patient Active Problem List   Diagnosis Date Noted  . History of pulmonary embolism 07/21/2017  . Nonsustained ventricular tachycardia ([place]) 07/21/2017  . Healthcare-associated pneumonia 07/01/2017  . Acute respiratory failure with hypoxia ([place]) 07/01/2017  . Acute diastolic heart failure ([place]) 07/01/2017  . Conjunctivitis 07/01/2017    . Sepsis ([place]) 07/01/2017  . HCAP (healthcare-associated pneumonia)   . Acute pulmonary edema ([facility])   . Tachycardia-bradycardia syndrome ([place]) 04/01/2017  . Pacemaker 04/01/2017  . Chronic atrial fibrillation ([place])   . Syncope and collapse   . Loss of consciousness ([place]) 01/08/2017  . Dehydration 11/25/2016  . Acute encephalopathy   . Encephalopathy 11/24/2016  . Dyspnea on exertion 10/29/2016  . Chronic respiratory failure with hypoxia ([place]) 10/29/2016  . Long term current use of anticoagulant 10/23/2016  . TIA (transient ischemic attack) 09/06/2016  . COPD (chronic obstructive pulmonary disease) ([place]) 09/06/2016  . Essential hypertension 09/06/2016  . GERD (gastroesophageal reflux disease) 09/06/2016  . Depression 09/06/2016  . Hyperlipidemia 10/23/2015  . Aortic insufficiency 12/15/2014  . Edema of both legs 09/19/2014  . Atrial fibrillation with slow ventricular response ([place]) 11/21/2013  . Chest pain 11/21/2013  . Community acquired pneumonia 06/19/2012  . Pulmonary embolism ([place]) 06/19/2012  . Hypokalemia 06/18/2012  . COPD with acute exacerbation ([place]) 06/18/2012  . Hypoxia 06/18/2012    Past Surgical History:  Procedure Laterality Date  . PACEMAKER IMPLANT N/A 04/01/2017   Procedure: Pacemaker Implant;  Surgeon: Sanda Klein, MD;  Location: [facility];  Service: Cardiovascular;  Laterality: N/A; MDT Azure XT SR MRI  . REPLACEMENT TOTAL KNEE    . ROTATOR CUFF REPAIR      OB History  No data available       Home Medications    Prior to Admission medications   Medication Sig Start Date End Date Taking? Authorizing Provider  acetaminophen (TYLENOL) 325 MG tablet Take 650 mg by mouth every 6 (six) hours as needed (for pain).     [provider]  ALPRAZolam Duanne Moron) 0.25 MG tablet Take 1 tablet (0.25 mg total) by mouth 2 (two) times daily as needed for anxiety. 10/21/14   Cristal Ford, DO  furosemide (LASIX) 40 MG tablet Take 1 tablet (40  mg total) by mouth every morning. 01/10/17   Domenic Polite, MD  metoprolol (LOPRESSOR) 25 MG tablet Take 1 tablet (25 mg total) by mouth 2 (two) times daily. 06/20/14   Lorretta Harp, MD  Multiple Vitamins-Minerals (WOMENS 50+ [place] VITAMIN/MIN) TABS Take 1 tablet by mouth daily.    [provider]  nitroGLYCERIN (NITROSTAT) 0.4 MG SL tablet Place 0.4 mg under the tongue as needed for chest pain. X 3 doses 02/08/16   [provider]  omeprazole (PRILOSEC) 20 MG capsule Take 20 mg by mouth daily as needed (for reflux/heartburn).  04/23/15   [provider]  OXYGEN Inhale 2 L into the lungs continuous.     [provider]  potassium chloride SA (K-DUR,KLOR-CON) 20 MEQ tablet Take 40 mEq by mouth daily. Take 2 tablets every morning 01/13/17   Isaiah Serge, NP  pravastatin (PRAVACHOL) 40 MG tablet Take 40 mg by mouth every morning.     [provider]  Rivaroxaban (XARELTO) 15 MG TABS tablet Take 1 tablet (15 mg total) by mouth every morning. 09/17/17   Lorretta Harp, MD  venlafaxine XR (EFFEXOR-XR) 150 MG 24 hr capsule Take 150 mg by mouth daily.    [provider]  venlafaxine XR (EFFEXOR-XR) 75 MG 24 hr capsule Take 1 capsule by mouth daily. 07/14/17   [provider]  zolpidem (AMBIEN) 5 MG tablet Take 5 mg by mouth at bedtime. 11/03/13   [provider]    Family History Family History  Problem Relation Age of Onset  . Alzheimer's disease Mother   . Heart attack Father   . Diabetes type II Daughter   . Heart disease Daughter        stents    Social History Social History   Tobacco Use  . Smoking status: Never Smoker  . Smokeless tobacco: Never Used  Substance Use Topics  . Alcohol use: No  . Drug use: No     Allergies   Patient has no known allergies.   Review of Systems Review of Systems  Constitutional: Negative for chills, diaphoresis and fever.  Respiratory: Negative for cough and shortness  of breath.   Cardiovascular: Negative for chest pain.  Gastrointestinal: Negative for abdominal pain, diarrhea, nausea and vomiting.  Musculoskeletal: Negative for back pain and neck pain.  Skin: Positive for color change.  Neurological: Negative for dizziness, syncope, weakness, numbness and headaches.  All other systems reviewed and are negative.    Physical Exam Updated Vital Signs BP (!) 156/92 (BP Location: Left Arm)   Pulse 75   Temp 97.7 F (36.5 C) (Oral)   Resp 16   SpO2 95%   Physical Exam  Constitutional: She appears well-developed and well-nourished. No distress.  HENT:  Head: Normocephalic and atraumatic.  Eyes: Conjunctivae are normal.  Neck: Neck supple.  Cardiovascular: Normal rate, regular rhythm, normal heart sounds and intact distal pulses.  Pulses:  Radial pulses are 2+ on the right side, and 2+ on the left side.       Dorsalis pedis pulses are 2+ on the right side, and 2+ on the left side.       Posterior tibial pulses are 2+ on the right side, and 2+ on the left side.  Pulmonary/Chest: Effort normal and breath sounds normal. No respiratory distress.  Abdominal: Soft. There is no tenderness. There is no guarding.  Musculoskeletal: She exhibits no edema or tenderness.  No tenderness in the patient's extremities.   Full range of motion in the cardinal directions of the hips, knees, and ankles.  Full range of motion in the upper extremities. Normal motor function intact in all extremities and spine. No midline spinal tenderness.   Lymphadenopathy:    She has no cervical adenopathy.  Neurological: She is alert.  No sensory deficits. Strength 5/5 in all extremities. No gait disturbance - ambulates with a walker per her baseline. Coordination intact. Cranial nerves III-XII grossly intact. No facial droop.   Skin: Skin is warm and dry. She is not diaphoretic.  Cap refill in hands is 4-5 sec bilaterally, but patient states this is normal for her.   Cap  refill <2 in toes with no evidence of cyanosis. There is some coolness to the touch in the fingers and toes, however, they are equal bilaterally and patient states this is also normal for her.  Psychiatric: She has a normal mood and affect. Her behavior is normal.  Nursing note and vitals reviewed.    ED Treatments / Results  Labs (all labs ordered are listed, but only abnormal results are displayed) Labs Reviewed - No data to display  EKG  EKG Interpretation  Date/Time:  Tuesday October 13 2017 19:17:17 EST Ventricular Rate:  80 PR Interval:    QRS Duration: 163 QT Interval:  428 QTC Calculation: 494 R Axis:   90 Text Interpretation:  Atrial fibrillation RBBB and LPFB ST depr, consider ischemia, anterolateral lds Since last tracing rate slower Confirmed by Orlie Dakin [phone]) on 10/13/2017 7:37:13 PM       Radiology No results found.  Procedures Procedures (including critical care time)  Medications Ordered in ED Medications - No data to display   Initial Impression / Assessment and Plan / ED Course  I have reviewed the triage vital signs and the nursing notes.  Pertinent labs & imaging results that were available during my care of the patient were reviewed by me and considered in my medical decision making (see chart for details).     Patient presents with report of "blue toes."  No acute abnormalities noted on exam. Patient is nontoxic appearing, afebrile, not tachycardic, not tachypneic, not hypotensive, maintains excellent SPO2 on room air, and is in no apparent distress.  Ambulated per baseline.  PCP follow-up.  Return precautions discussed.  Patient and her family at the bedside voice understanding and are comfortable with discharge.  Findings and plan of care discussed with Orlie Dakin, MD. Dr. Winfred Leeds personally evaluated and examined this patient.   Vitals:   10/13/17 2000 10/13/17 2015 10/13/17 2030 10/13/17 2045  BP: (!) 158/72 (!) 147/68 (!)  142/87 (!) 144/76  Pulse:      Resp: (!) 27 20 15 14   Temp:      TempSrc:      SpO2:        Findings and plan of care discussed with Orlie Dakin. Dr. Winfred Leeds personally evaluated and examined this patient.  Final Clinical Impressions(s) / ED Diagnoses   Final diagnoses:  Blue toes    ED Discharge Orders    None       Layla Maw 10/14/17 [place], MD 10/15/17 [phone]

## 2017-10-13 NOTE — ED Notes (Signed)
Pt departed in NAD.  

## 2017-10-14 ENCOUNTER — Ambulatory Visit (INDEPENDENT_AMBULATORY_CARE_PROVIDER_SITE_OTHER): Payer: Medicare Other | Admitting: Surgery

## 2017-10-19 ENCOUNTER — Telehealth: Payer: Self-pay | Admitting: Cardiology

## 2017-10-19 ENCOUNTER — Ambulatory Visit (INDEPENDENT_AMBULATORY_CARE_PROVIDER_SITE_OTHER): Payer: Medicare Other | Admitting: *Deleted

## 2017-10-19 DIAGNOSIS — I495 Sick sinus syndrome: Secondary | ICD-10-CM

## 2017-10-19 NOTE — Telephone Encounter (Signed)
Confirmed remote transmission w/ pt daughter.   

## 2017-10-20 LAB — CUP PACEART REMOTE DEVICE CHECK
Battery Remaining Longevity: 172 mo
Battery Voltage: 3.15 V
Brady Statistic RV Percent Paced: 55.65 %
Lead Channel Impedance Value: 380 Ohm
Lead Channel Impedance Value: 494 Ohm
Lead Channel Pacing Threshold Pulse Width: 0.4 ms
Lead Channel Setting Pacing Amplitude: 2 V
Lead Channel Setting Pacing Pulse Width: 0.4 ms
Lead Channel Setting Sensing Sensitivity: 0.9 mV
MDC IDC LEAD IMPLANT DT: 20180711
MDC IDC LEAD LOCATION: 753860
MDC IDC MSMT LEADCHNL RV PACING THRESHOLD AMPLITUDE: 0.625 V
MDC IDC MSMT LEADCHNL RV SENSING INTR AMPL: 5.875 mV
MDC IDC MSMT LEADCHNL RV SENSING INTR AMPL: 5.875 mV
MDC IDC PG IMPLANT DT: 20180711
MDC IDC SESS DTM: 20190128210127

## 2017-10-20 NOTE — Progress Notes (Signed)
Remote pacemaker transmission.   

## 2017-10-21 ENCOUNTER — Encounter: Payer: Self-pay | Admitting: Cardiology

## 2017-10-23 DIAGNOSIS — E118 Type 2 diabetes mellitus with unspecified complications: Secondary | ICD-10-CM | POA: Diagnosis not present

## 2017-10-28 DIAGNOSIS — E119 Type 2 diabetes mellitus without complications: Secondary | ICD-10-CM | POA: Diagnosis not present

## 2017-10-28 DIAGNOSIS — E7800 Pure hypercholesterolemia, unspecified: Secondary | ICD-10-CM | POA: Diagnosis not present

## 2017-10-28 DIAGNOSIS — I10 Essential (primary) hypertension: Secondary | ICD-10-CM | POA: Diagnosis not present

## 2017-10-28 DIAGNOSIS — K219 Gastro-esophageal reflux disease without esophagitis: Secondary | ICD-10-CM | POA: Diagnosis not present

## 2017-10-30 ENCOUNTER — Telehealth: Payer: Self-pay | Admitting: Cardiovascular Disease

## 2017-10-30 DIAGNOSIS — J449 Chronic obstructive pulmonary disease, unspecified: Secondary | ICD-10-CM | POA: Diagnosis not present

## 2017-10-30 NOTE — Telephone Encounter (Signed)
Patient calling the office for samples of medication:   1.  What medication and dosage are you requesting samples for? Xarelto  2.  Are you currently out of this medication?  Enough until Tuesday

## 2017-10-30 NOTE — Telephone Encounter (Signed)
Returned call to patient's daughter Sharion Balloon 15 mg samples left at [employer] office front desk.

## 2017-11-02 ENCOUNTER — Other Ambulatory Visit: Payer: Self-pay

## 2017-11-02 ENCOUNTER — Inpatient Hospital Stay (HOSPITAL_COMMUNITY)
Admission: EM | Admit: 2017-11-02 | Discharge: 2017-11-04 | DRG: 312 | Disposition: A | Discharge status: Home Health | Payer: Medicare Other | Attending: Family Medicine | Admitting: Family Medicine

## 2017-11-02 ENCOUNTER — Emergency Department (HOSPITAL_COMMUNITY): Payer: Medicare Other

## 2017-11-02 ENCOUNTER — Encounter (HOSPITAL_COMMUNITY): Payer: Self-pay | Admitting: Emergency Medicine

## 2017-11-02 DIAGNOSIS — Z9981 Dependence on supplemental oxygen: Secondary | ICD-10-CM

## 2017-11-02 DIAGNOSIS — E7800 Pure hypercholesterolemia, unspecified: Secondary | ICD-10-CM | POA: Diagnosis not present

## 2017-11-02 DIAGNOSIS — Z86711 Personal history of pulmonary embolism: Secondary | ICD-10-CM

## 2017-11-02 DIAGNOSIS — Z9119 Patient's noncompliance with other medical treatment and regimen: Secondary | ICD-10-CM

## 2017-11-02 DIAGNOSIS — F329 Major depressive disorder, single episode, unspecified: Secondary | ICD-10-CM | POA: Diagnosis present

## 2017-11-02 DIAGNOSIS — Z950 Presence of cardiac pacemaker: Secondary | ICD-10-CM | POA: Diagnosis not present

## 2017-11-02 DIAGNOSIS — Z86718 Personal history of other venous thrombosis and embolism: Secondary | ICD-10-CM

## 2017-11-02 DIAGNOSIS — E860 Dehydration: Secondary | ICD-10-CM | POA: Diagnosis present

## 2017-11-02 DIAGNOSIS — J439 Emphysema, unspecified: Secondary | ICD-10-CM | POA: Diagnosis present

## 2017-11-02 DIAGNOSIS — Z8673 Personal history of transient ischemic attack (TIA), and cerebral infarction without residual deficits: Secondary | ICD-10-CM | POA: Diagnosis not present

## 2017-11-02 DIAGNOSIS — I4821 Permanent atrial fibrillation: Secondary | ICD-10-CM | POA: Diagnosis present

## 2017-11-02 DIAGNOSIS — R040 Epistaxis: Secondary | ICD-10-CM | POA: Diagnosis not present

## 2017-11-02 DIAGNOSIS — E785 Hyperlipidemia, unspecified: Secondary | ICD-10-CM | POA: Diagnosis present

## 2017-11-02 DIAGNOSIS — S199XXA Unspecified injury of neck, initial encounter: Secondary | ICD-10-CM | POA: Diagnosis not present

## 2017-11-02 DIAGNOSIS — W19XXXA Unspecified fall, initial encounter: Secondary | ICD-10-CM

## 2017-11-02 DIAGNOSIS — I482 Chronic atrial fibrillation: Secondary | ICD-10-CM | POA: Diagnosis not present

## 2017-11-02 DIAGNOSIS — Z79899 Other long term (current) drug therapy: Secondary | ICD-10-CM | POA: Diagnosis not present

## 2017-11-02 DIAGNOSIS — I495 Sick sinus syndrome: Secondary | ICD-10-CM | POA: Diagnosis not present

## 2017-11-02 DIAGNOSIS — R55 Syncope and collapse: Principal | ICD-10-CM | POA: Diagnosis present

## 2017-11-02 DIAGNOSIS — S299XXA Unspecified injury of thorax, initial encounter: Secondary | ICD-10-CM | POA: Diagnosis not present

## 2017-11-02 DIAGNOSIS — Y92009 Unspecified place in unspecified non-institutional (private) residence as the place of occurrence of the external cause: Secondary | ICD-10-CM | POA: Diagnosis not present

## 2017-11-02 DIAGNOSIS — S0990XA Unspecified injury of head, initial encounter: Secondary | ICD-10-CM | POA: Diagnosis not present

## 2017-11-02 DIAGNOSIS — Z66 Do not resuscitate: Secondary | ICD-10-CM | POA: Diagnosis not present

## 2017-11-02 DIAGNOSIS — I5031 Acute diastolic (congestive) heart failure: Secondary | ICD-10-CM

## 2017-11-02 DIAGNOSIS — J441 Chronic obstructive pulmonary disease with (acute) exacerbation: Secondary | ICD-10-CM

## 2017-11-02 DIAGNOSIS — I10 Essential (primary) hypertension: Secondary | ICD-10-CM | POA: Diagnosis not present

## 2017-11-02 DIAGNOSIS — I4891 Unspecified atrial fibrillation: Secondary | ICD-10-CM | POA: Diagnosis not present

## 2017-11-02 DIAGNOSIS — S022XXA Fracture of nasal bones, initial encounter for closed fracture: Secondary | ICD-10-CM | POA: Diagnosis present

## 2017-11-02 DIAGNOSIS — S0993XA Unspecified injury of face, initial encounter: Secondary | ICD-10-CM | POA: Diagnosis not present

## 2017-11-02 DIAGNOSIS — J449 Chronic obstructive pulmonary disease, unspecified: Secondary | ICD-10-CM

## 2017-11-02 DIAGNOSIS — Z7901 Long term (current) use of anticoagulants: Secondary | ICD-10-CM

## 2017-11-02 LAB — COMPREHENSIVE METABOLIC PANEL
ALT: 15 U/L (ref 14–54)
AST: 23 U/L (ref 15–41)
Albumin: 3.7 g/dL (ref 3.5–5.0)
Alkaline Phosphatase: 100 U/L (ref 38–126)
Anion gap: 11 (ref 5–15)
BUN: 17 mg/dL (ref 6–20)
CHLORIDE: 104 mmol/L (ref 101–111)
CO2: 25 mmol/L (ref 22–32)
Calcium: 9 mg/dL (ref 8.9–10.3)
Creatinine, Ser: 0.88 mg/dL (ref 0.44–1.00)
GFR calc Af Amer: >60 mL/min (ref >60)
GFR calc non Af Amer: 57 mL/min — ABNORMAL LOW (ref >60)
GLUCOSE: 117 mg/dL — AB (ref 65–99)
Potassium: 4.2 mmol/L (ref 3.5–5.1)
SODIUM: 140 mmol/L (ref 135–145)
Total Bilirubin: 0.9 mg/dL (ref 0.3–1.2)
Total Protein: 6.9 g/dL (ref 6.5–8.1)

## 2017-11-02 LAB — URINALYSIS, ROUTINE W REFLEX MICROSCOPIC
BILIRUBIN URINE: NEGATIVE
Glucose, UA: NEGATIVE mg/dL
Hgb urine dipstick: NEGATIVE
Ketones, ur: NEGATIVE mg/dL
LEUKOCYTES UA: NEGATIVE
NITRITE: NEGATIVE
Protein, ur: NEGATIVE mg/dL
SPECIFIC GRAVITY, URINE: 1.01 (ref 1.005–1.030)
pH: 8 (ref 5.0–8.0)

## 2017-11-02 LAB — CBC WITH DIFFERENTIAL/PLATELET
Basophils Absolute: 0 10*3/uL (ref 0.0–0.1)
Basophils Relative: 0 %
EOS ABS: 0.2 10*3/uL (ref 0.0–0.7)
EOS PCT: 2 %
HCT: 48.2 % — ABNORMAL HIGH (ref 36.0–46.0)
Hemoglobin: 14.8 g/dL (ref 12.0–15.0)
LYMPHS ABS: 2.2 10*3/uL (ref 0.7–4.0)
Lymphocytes Relative: 25 %
MCH: 27.3 pg (ref 26.0–34.0)
MCHC: 30.7 g/dL (ref 30.0–36.0)
MCV: 88.9 fL (ref 78.0–100.0)
MONO ABS: 0.7 10*3/uL (ref 0.1–1.0)
MONOS PCT: 8 %
Neutro Abs: 5.8 10*3/uL (ref 1.7–7.7)
Neutrophils Relative %: 65 %
PLATELETS: 165 10*3/uL (ref 150–400)
RBC: 5.42 MIL/uL — ABNORMAL HIGH (ref 3.87–5.11)
RDW: 15.4 % (ref 11.5–15.5)
WBC: 8.8 10*3/uL (ref 4.0–10.5)

## 2017-11-02 LAB — MAGNESIUM: Magnesium: 2 mg/dL (ref 1.7–2.4)

## 2017-11-02 LAB — TROPONIN I

## 2017-11-02 MED ORDER — ACETAMINOPHEN 650 MG RE SUPP: 650.0000 mg | Freq: Four times a day (QID) | RECTAL | Status: DC | PRN

## 2017-11-02 MED ORDER — OXYMETAZOLINE HCL 0.05 % NA SOLN
1.0000 | Freq: Once | NASAL | Status: AC
  Administered 2017-11-02: 1 via NASAL
  Filled 2017-11-02: qty 15

## 2017-11-02 MED ORDER — METOPROLOL TARTRATE 25 MG PO TABS
25.0000 mg | ORAL_TABLET | Freq: Two times a day (BID) | ORAL | Status: DC
  Administered 2017-11-02 – 2017-11-04 (×4): 25 mg via ORAL
  Filled 2017-11-02 (×5): qty 1

## 2017-11-02 MED ORDER — ONDANSETRON HCL 4 MG PO TABS: 4.0000 mg | ORAL_TABLET | Freq: Four times a day (QID) | ORAL | Status: DC | PRN

## 2017-11-02 MED ORDER — PRAVASTATIN SODIUM 40 MG PO TABS
40.0000 mg | ORAL_TABLET | Freq: Every morning | ORAL | Status: DC
  Administered 2017-11-02 – 2017-11-04 (×3): 40 mg via ORAL
  Filled 2017-11-02 (×3): qty 1

## 2017-11-02 MED ORDER — ALPRAZOLAM 0.25 MG PO TABS
0.2500 mg | ORAL_TABLET | Freq: Two times a day (BID) | ORAL | Status: DC | PRN
  Administered 2017-11-03 – 2017-11-04 (×3): 0.25 mg via ORAL
  Filled 2017-11-02 (×4): qty 1

## 2017-11-02 MED ORDER — ACETAMINOPHEN 325 MG PO TABS: 650.0000 mg | ORAL_TABLET | Freq: Four times a day (QID) | ORAL | Status: DC | PRN

## 2017-11-02 MED ORDER — SODIUM CHLORIDE 0.9% FLUSH
3.0000 mL | Freq: Two times a day (BID) | INTRAVENOUS | Status: DC
  Administered 2017-11-04: 3 mL via INTRAVENOUS

## 2017-11-02 MED ORDER — PANTOPRAZOLE SODIUM 40 MG PO TBEC
40.0000 mg | DELAYED_RELEASE_TABLET | Freq: Every day | ORAL | Status: DC
  Administered 2017-11-04: 40 mg via ORAL
  Filled 2017-11-02 (×3): qty 1

## 2017-11-02 MED ORDER — ONDANSETRON HCL 4 MG/2ML IJ SOLN: 4.0000 mg | Freq: Four times a day (QID) | INTRAMUSCULAR | Status: DC | PRN

## 2017-11-02 MED ORDER — VENLAFAXINE HCL ER 75 MG PO CP24
75.0000 mg | ORAL_CAPSULE | Freq: Every day | ORAL | Status: DC
  Administered 2017-11-02 – 2017-11-03 (×2): 75 mg via ORAL
  Filled 2017-11-02 (×2): qty 1

## 2017-11-02 MED ORDER — SODIUM CHLORIDE 0.9 % IV SOLN
INTRAVENOUS | Status: DC
  Administered 2017-11-02 – 2017-11-04 (×3): via INTRAVENOUS

## 2017-11-02 MED ORDER — ZOLPIDEM TARTRATE 5 MG PO TABS: 5.0000 mg | ORAL_TABLET | Freq: Every day | ORAL | Status: DC

## 2017-11-02 MED ORDER — RIVAROXABAN 15 MG PO TABS: 15.0000 mg | ORAL_TABLET | Freq: Every morning | ORAL | Status: DC

## 2017-11-02 NOTE — Progress Notes (Signed)
Patient too dizzy to obtain orthostatic vitals at this time.

## 2017-11-02 NOTE — Progress Notes (Signed)
Patient and family refuses PT evaluation this admission; Dr. Lorin Mercy at bedside. PT/OT order discontinued.

## 2017-11-02 NOTE — ED Notes (Signed)
[employer] pacemaker interrogated °

## 2017-11-02 NOTE — ED Provider Notes (Signed)
[facility] EMERGENCY DEPARTMENT Provider Note   CSN: [id number] Arrival date & time: 11/02/17  1116     History   Chief Complaint Chief Complaint  Patient presents with  . Fall  . Loss of Consciousness    HPI Raven Gray is a 84 y.o. female.  HPI Patient with fall.  Found on the floor.  Patient cannot tell me when she fell onto the floor.  Complaining of pain in her nose and right eye.  Hematoma to right eye area and swelling bridge of nose.  Had previous bleeding on nose.  History of A. fib is on Xarelto.  Patient is mildly confused and cannot really tell me what happened.  Has pacemaker that has been interrogated now and does not show arrhythmia. Past Medical History:  Diagnosis Date  . Atrial fibrillation ([place])    a. on Xarelto  . Chest pain   . COPD (chronic obstructive pulmonary disease) ([place])    family unsure if is copd,   . H/O echocardiogram    a. 08/2016: EF of 60-65%, severely dilated LA, mild pulmonic regurgitations, mild TR, and PA Pressure at 38 mm Hg  . History of depression   . History of DVT (deep vein thrombosis)   . History of pulmonary embolism   . Hyperlipidemia   . Hypertension   . Hypokalemia    Now improved with treatment  . Shortness of breath   . Stroke ([place])   . Tachy-brady syndrome ([place]) 04/01/2017   s/p MDT PPM     Patient Active Problem List   Diagnosis Date Noted  . History of pulmonary embolism 07/21/2017  . Nonsustained ventricular tachycardia ([place]) 07/21/2017  . Healthcare-associated pneumonia 07/01/2017  . Acute respiratory failure with hypoxia ([place]) 07/01/2017  . Acute diastolic heart failure ([place]) 07/01/2017  . Conjunctivitis 07/01/2017  . Sepsis ([place]) 07/01/2017  . HCAP (healthcare-associated pneumonia)   . Acute pulmonary edema ([facility])   . Tachycardia-bradycardia syndrome ([place]) 04/01/2017  . Pacemaker 04/01/2017  . Chronic atrial fibrillation ([place])   . Syncope and collapse   . Loss of consciousness ([place])  01/08/2017  . Dehydration 11/25/2016  . Acute encephalopathy   . Encephalopathy 11/24/2016  . Dyspnea on exertion 10/29/2016  . Chronic respiratory failure with hypoxia ([place]) 10/29/2016  . Long term current use of anticoagulant 10/23/2016  . TIA (transient ischemic attack) 09/06/2016  . COPD (chronic obstructive pulmonary disease) ([place]) 09/06/2016  . Essential hypertension 09/06/2016  . GERD (gastroesophageal reflux disease) 09/06/2016  . Depression 09/06/2016  . Hyperlipidemia 10/23/2015  . Aortic insufficiency 12/15/2014  . Edema of both legs 09/19/2014  . Atrial fibrillation with slow ventricular response ([place]) 11/21/2013  . Chest pain 11/21/2013  . Community acquired pneumonia 06/19/2012  . Pulmonary embolism ([place]) 06/19/2012  . Hypokalemia 06/18/2012  . COPD with acute exacerbation ([place]) 06/18/2012  . Hypoxia 06/18/2012    Past Surgical History:  Procedure Laterality Date  . PACEMAKER IMPLANT N/A 04/01/2017   Procedure: Pacemaker Implant;  Surgeon: Sanda Klein, MD;  Location: [facility];  Service: Cardiovascular;  Laterality: N/A; MDT Azure XT SR MRI  . REPLACEMENT TOTAL KNEE    . ROTATOR CUFF REPAIR      OB History    No data available       Home Medications    Prior to Admission medications   Medication Sig Start Date End Date Taking? Authorizing Provider  acetaminophen (TYLENOL) 325 MG tablet Take 650 mg by mouth every 6 (  six) hours as needed (for pain).    Yes [provider]  ALPRAZolam (XANAX) 0.25 MG tablet Take 1 tablet (0.25 mg total) by mouth 2 (two) times daily as needed for anxiety. 10/21/14  Yes Mikhail, Velta Addison, DO  furosemide (LASIX) 40 MG tablet Take 1 tablet (40 mg total) by mouth every morning. 01/10/17  Yes Domenic Polite, MD  metoprolol (LOPRESSOR) 25 MG tablet Take 1 tablet (25 mg total) by mouth 2 (two) times daily. 06/20/14  Yes Lorretta Harp, MD  Multiple Vitamins-Minerals (WOMENS 50+ [place] VITAMIN/MIN) TABS Take 1  tablet by mouth daily.   Yes [provider]  omeprazole (PRILOSEC) 20 MG capsule Take 20 mg by mouth daily as needed (for reflux/heartburn).  04/23/15  Yes [provider]  potassium chloride SA (K-DUR,KLOR-CON) 20 MEQ tablet Take 40 mEq by mouth daily. Take 2 tablets every morning 01/13/17  Yes Isaiah Serge, NP  pravastatin (PRAVACHOL) 40 MG tablet Take 40 mg by mouth every morning.    Yes [provider]  Rivaroxaban (XARELTO) 15 MG TABS tablet Take 1 tablet (15 mg total) by mouth every morning. 09/17/17  Yes Lorretta Harp, MD  venlafaxine XR (EFFEXOR-XR) 150 MG 24 hr capsule Take 150 mg by mouth daily.   Yes [provider]  venlafaxine XR (EFFEXOR-XR) 75 MG 24 hr capsule Take 1 capsule by mouth daily. 07/14/17  Yes [provider]  zolpidem (AMBIEN) 5 MG tablet Take 5 mg by mouth at bedtime. 11/03/13  Yes [provider]  nitroGLYCERIN (NITROSTAT) 0.4 MG SL tablet Place 0.4 mg under the tongue as needed for chest pain. X 3 doses 02/08/16   [provider]  OXYGEN Inhale 2 L into the lungs continuous.     [provider]    Family History Family History  Problem Relation Age of Onset  . Alzheimer's disease Mother   . Heart attack Father   . Diabetes type II Daughter   . Heart disease Daughter        stents    Social History Social History   Tobacco Use  . Smoking status: Never Smoker  . Smokeless tobacco: Never Used  Substance Use Topics  . Alcohol use: No  . Drug use: No     Allergies   Patient has no known allergies.   Review of Systems Review of Systems  Constitutional: Negative for appetite change and fever.  HENT: Negative for congestion.   Eyes: Negative for photophobia.  Respiratory: Negative for shortness of breath.   Cardiovascular: Negative for chest pain.  Gastrointestinal: Negative for abdominal pain.  Genitourinary: Negative for flank pain.  Musculoskeletal: Negative for back pain  and neck pain.  Skin: Positive for wound.  Neurological: Positive for syncope.  Hematological: Negative for adenopathy.  Psychiatric/Behavioral: Positive for confusion.     Physical Exam Updated Vital Signs BP (!) 183/94   Pulse 76   Temp (!) 97.2 F (36.2 C) (Oral)   Resp (!) 25   Ht 5\' 2"  (1.575 m)   Wt 78.5 kg (173 lb)   SpO2 100%   BMI 31.64 kg/m   Physical Exam  Constitutional: She appears well-developed.  HENT:  Right periorbital hematoma.  Swelling of the bridge of nose with bleeding in left nare.  Face otherwise stable.  Eye movements intact.  Eyes: EOM are normal.  Neck: Neck supple.  Cardiovascular: Normal rate.  Pulmonary/Chest: She has no wheezes. She has no rales.  Abdominal: There is no tenderness.  Musculoskeletal:  Edema to bilateral lower extremities.  No tenderness on upper or lower extremities.  Neurological: She is alert.  Patient is awake and pleasant but may have some mild confusion.  Skin: Skin is warm. Capillary refill takes less than 2 seconds.     ED Treatments / Results  Labs (all labs ordered are listed, but only abnormal results are displayed) Labs Reviewed  COMPREHENSIVE METABOLIC PANEL - Abnormal; Notable for the following components:      Result Value   Glucose, Bld 117 (*)    GFR calc non Af Amer 57 (*)    All other components within normal limits  CBC WITH DIFFERENTIAL/PLATELET - Abnormal; Notable for the following components:   RBC 5.42 (*)    HCT 48.2 (*)    All other components within normal limits  TROPONIN I  URINALYSIS, ROUTINE W REFLEX MICROSCOPIC    EKG  EKG Interpretation  Date/Time:  Monday November 02 2017 11:24:38 EST Ventricular Rate:  73 PR Interval:    QRS Duration: 184 QT Interval:  472 QTC Calculation: 502 R Axis:   -66 Text Interpretation:  Atrial fibrillation Left bundle branch block paced rhythm? Confirmed by Davonna Belling [phone]) on 11/02/2017 1:33:26 PM       Radiology Ct Head Wo  Contrast  Result Date: 11/02/2017 CLINICAL DATA:  Unwitnessed fall to floor with injury to nose and left orbit. EXAM: CT HEAD WITHOUT CONTRAST CT MAXILLOFACIAL WITHOUT CONTRAST CT CERVICAL SPINE WITHOUT CONTRAST TECHNIQUE: Multidetector CT imaging of the head, cervical spine, and maxillofacial structures were performed using the standard protocol without intravenous contrast. Multiplanar CT image reconstructions of the cervical spine and maxillofacial structures were also generated. COMPARISON:  Head CT 01/08/2017 FINDINGS: CT HEAD FINDINGS Brain: Ventricles, cisterns and other CSF spaces are within normal. There is mild chronic ischemic microvascular disease and minimal age related atrophic change. No mass, mass effect, shift of midline structures or acute hemorrhage. No evidence of acute infarction. Vascular: No hyperdense vessel or unexpected calcification. Skull: Minimally displaced nasal bone fracture. Other: Moderate opacification with air-fluid levels over the sinuses likely hemorrhagic debris. CT MAXILLOFACIAL FINDINGS Osseous: Examination demonstrates a minimally displaced nasal bone fracture. No additional facial bone fractures are identified. Degenerate change of the temporomandibular joints bilaterally. Mild degenerate change of the spine. Orbits: Soft tissue swelling over the right periorbital region. The globes and retrobulbar spaces are normal symmetric. Sinuses: Paranasal sinuses are well developed as there is opacification with scattered air-fluid levels present likely combination of inflammatory change and hemorrhagic debris. Deviation of the nasal septum to the left. Soft tissues: Right periorbital soft tissue swelling. CT CERVICAL SPINE FINDINGS Alignment: Subtle 2 mm anterior subluxation of C3 on C4 likely degenerative. No traumatic subluxation. Skull base and vertebrae: Vertebral body heights are normal. There is mild to moderate spondylosis of the cervical spine. No evidence of acute  fracture. Mild bilateral neural foraminal narrowing at several levels due to adjacent bony spurring. There is moderate uncovertebral joint spurring and facet arthropathy. Soft tissues and spinal canal: Prevertebral soft tissues are normal. Spinal canal is within normal. Disc levels: Disc space narrowing at the C4-5, C5-6 and C6-7 levels. Upper chest: Within normal. Other: None. IMPRESSION: No acute brain injury. Mild chronic ischemic microvascular disease and age related atrophic change. Minimally displaced nasal bone fracture. Right periorbital soft tissue swelling. Combination of inflammatory change and hemorrhagic debris within the paranasal sinuses. No acute cervical spine injury. Moderate spondylosis of the cervical spine with multilevel disc disease. Electronically  Signed   By: Marin Olp M.D.   On: 11/02/2017 12:18   Ct Cervical Spine Wo Contrast  Result Date: 11/02/2017 CLINICAL DATA:  Unwitnessed fall to floor with injury to nose and left orbit. EXAM: CT HEAD WITHOUT CONTRAST CT MAXILLOFACIAL WITHOUT CONTRAST CT CERVICAL SPINE WITHOUT CONTRAST TECHNIQUE: Multidetector CT imaging of the head, cervical spine, and maxillofacial structures were performed using the standard protocol without intravenous contrast. Multiplanar CT image reconstructions of the cervical spine and maxillofacial structures were also generated. COMPARISON:  Head CT 01/08/2017 FINDINGS: CT HEAD FINDINGS Brain: Ventricles, cisterns and other CSF spaces are within normal. There is mild chronic ischemic microvascular disease and minimal age related atrophic change. No mass, mass effect, shift of midline structures or acute hemorrhage. No evidence of acute infarction. Vascular: No hyperdense vessel or unexpected calcification. Skull: Minimally displaced nasal bone fracture. Other: Moderate opacification with air-fluid levels over the sinuses likely hemorrhagic debris. CT MAXILLOFACIAL FINDINGS Osseous: Examination demonstrates a  minimally displaced nasal bone fracture. No additional facial bone fractures are identified. Degenerate change of the temporomandibular joints bilaterally. Mild degenerate change of the spine. Orbits: Soft tissue swelling over the right periorbital region. The globes and retrobulbar spaces are normal symmetric. Sinuses: Paranasal sinuses are well developed as there is opacification with scattered air-fluid levels present likely combination of inflammatory change and hemorrhagic debris. Deviation of the nasal septum to the left. Soft tissues: Right periorbital soft tissue swelling. CT CERVICAL SPINE FINDINGS Alignment: Subtle 2 mm anterior subluxation of C3 on C4 likely degenerative. No traumatic subluxation. Skull base and vertebrae: Vertebral body heights are normal. There is mild to moderate spondylosis of the cervical spine. No evidence of acute fracture. Mild bilateral neural foraminal narrowing at several levels due to adjacent bony spurring. There is moderate uncovertebral joint spurring and facet arthropathy. Soft tissues and spinal canal: Prevertebral soft tissues are normal. Spinal canal is within normal. Disc levels: Disc space narrowing at the C4-5, C5-6 and C6-7 levels. Upper chest: Within normal. Other: None. IMPRESSION: No acute brain injury. Mild chronic ischemic microvascular disease and age related atrophic change. Minimally displaced nasal bone fracture. Right periorbital soft tissue swelling. Combination of inflammatory change and hemorrhagic debris within the paranasal sinuses. No acute cervical spine injury. Moderate spondylosis of the cervical spine with multilevel disc disease. Electronically Signed   By: Marin Olp M.D.   On: 11/02/2017 12:18   Dg Chest Portable 1 View  Result Date: 11/02/2017 CLINICAL DATA:  Trauma secondary to a fall this morning. Facial lacerations. EXAM: PORTABLE CHEST 1 VIEW COMPARISON:  Chest x-ray dated 08/18/2017 and 07/02/2017 FINDINGS: Chronic cardiomegaly.   Large hiatal hernia.  Aortic atherosclerosis. Chronic slight accentuation of the interstitial markings bilaterally. No acute infiltrates or effusions. Pulmonary vascularity is normal. Pacemaker in place.  No acute bone abnormality. IMPRESSION: No acute abnormality. Chronic issue a shin of the interstitial markings. Huge hiatal hernia. Aortic Atherosclerosis (ICD10-I70.0). Electronically Signed   By: Lorriane Shire M.D.   On: 11/02/2017 11:39   Ct Maxillofacial Wo Contrast  Result Date: 11/02/2017 CLINICAL DATA:  Unwitnessed fall to floor with injury to nose and left orbit. EXAM: CT HEAD WITHOUT CONTRAST CT MAXILLOFACIAL WITHOUT CONTRAST CT CERVICAL SPINE WITHOUT CONTRAST TECHNIQUE: Multidetector CT imaging of the head, cervical spine, and maxillofacial structures were performed using the standard protocol without intravenous contrast. Multiplanar CT image reconstructions of the cervical spine and maxillofacial structures were also generated. COMPARISON:  Head CT 01/08/2017 FINDINGS: CT HEAD FINDINGS Brain: Ventricles, cisterns  and other CSF spaces are within normal. There is mild chronic ischemic microvascular disease and minimal age related atrophic change. No mass, mass effect, shift of midline structures or acute hemorrhage. No evidence of acute infarction. Vascular: No hyperdense vessel or unexpected calcification. Skull: Minimally displaced nasal bone fracture. Other: Moderate opacification with air-fluid levels over the sinuses likely hemorrhagic debris. CT MAXILLOFACIAL FINDINGS Osseous: Examination demonstrates a minimally displaced nasal bone fracture. No additional facial bone fractures are identified. Degenerate change of the temporomandibular joints bilaterally. Mild degenerate change of the spine. Orbits: Soft tissue swelling over the right periorbital region. The globes and retrobulbar spaces are normal symmetric. Sinuses: Paranasal sinuses are well developed as there is opacification with  scattered air-fluid levels present likely combination of inflammatory change and hemorrhagic debris. Deviation of the nasal septum to the left. Soft tissues: Right periorbital soft tissue swelling. CT CERVICAL SPINE FINDINGS Alignment: Subtle 2 mm anterior subluxation of C3 on C4 likely degenerative. No traumatic subluxation. Skull base and vertebrae: Vertebral body heights are normal. There is mild to moderate spondylosis of the cervical spine. No evidence of acute fracture. Mild bilateral neural foraminal narrowing at several levels due to adjacent bony spurring. There is moderate uncovertebral joint spurring and facet arthropathy. Soft tissues and spinal canal: Prevertebral soft tissues are normal. Spinal canal is within normal. Disc levels: Disc space narrowing at the C4-5, C5-6 and C6-7 levels. Upper chest: Within normal. Other: None. IMPRESSION: No acute brain injury. Mild chronic ischemic microvascular disease and age related atrophic change. Minimally displaced nasal bone fracture. Right periorbital soft tissue swelling. Combination of inflammatory change and hemorrhagic debris within the paranasal sinuses. No acute cervical spine injury. Moderate spondylosis of the cervical spine with multilevel disc disease. Electronically Signed   By: Marin Olp M.D.   On: 11/02/2017 12:18    Procedures Procedures (including critical care time)  Medications Ordered in ED Medications  oxymetazoline (AFRIN) 0.05 % nasal spray 1 spray (1 spray Each Nare Given 11/02/17 1528)     Initial Impression / Assessment and Plan / ED Course  I have reviewed the triage vital signs and the nursing notes.  Pertinent labs & imaging results that were available during my care of the patient were reviewed by me and considered in my medical decision making (see chart for details).     Patient brought in for syncope.  Found on the ground.  Unknown how long she has been there.  Has some confusion.  Has broken nose and  left-sided epistaxis.  Head CT reassuring otherwise.  No orbital fracture.  She is on anticoagulation.  Pacemaker has been interrogated and showed no arrhythmia.  Labs reassuring.  No other extremity trauma.  Will admit to hospitalist for her syncope.  Final Clinical Impressions(s) / ED Diagnoses   Final diagnoses:  Syncope, unspecified syncope type  Closed fracture of nasal bone, initial encounter  Epistaxis    ED Discharge Orders    None       Davonna Belling, MD 11/02/17 1533

## 2017-11-02 NOTE — Plan of Care (Signed)
  Coping: Level of anxiety will decrease 11/02/2017 2054 - Progressing by Meegan Shanafelt A, RN   Safety: Ability to remain free from injury will improve 11/02/2017 2054 - Progressing by Lani Havlik A, RN   Skin Integrity: Risk for impaired skin integrity will decrease 11/02/2017 2054 - Progressing by Meah Jiron, Roma Kayser, RN

## 2017-11-02 NOTE — ED Triage Notes (Addendum)
To ED via [facility] -- pt lives at home, was found by [employer]-- heard pt yelling on floor-- daughter went to house and found pt on floor-- had swelling and bleeding from nose-- swellilng over right eye--  Floor was linoleum - unsure how long pt was on floor.

## 2017-11-02 NOTE — H&P (Signed)
History and Physical    Raven Gray KVQ:[id number] DOB: 15-Mar-1928 DOA: 11/02/2017  **Will admit patient based on the expectation that the patient will need hospitalization/ hospital care that crosses at least 2 midnights  PCP: [name], MD   Attending physician: Lorin Mercy  Patient coming from/Resides with: Private residence  Chief Complaint: Suspected syncopal episode  HPI: Raven Gray is a 84 y.o. female with medical history significant for chronic atrial fibrillation, COPD, dyslipidemia, hypertension, history of DVT.  Patient has history of tachybradycardia syndrome and has a pacemaker present.  Patient was found down at home by mobile meal delivery service.  It was apparent the patient had fallen noting she had swelling and bleeding from her nose.  Patient does not recall falling only remembers waking up on the floor.  Patient presented to the ER on 1/22 after another fall at home that sounds like a syncopal episode.  Patient reported that she was standing at the sink noting she did not feel well and the next thing she knew she was on the floor.  Workup in the ER did not reveal any intracranial etiology to patient's symptoms.  Her pacemaker was interrogated and did not show any arrhythmia.  Maxillofacial CT did reveal a nasal fracture as well as debris in the sinuses.  Patient has had persistent slow ooze of blood from the left nare and EDP has placed a nasal rocket.  Patient is on chronic anticoagulation with daily Xarelto.  ED Course:  Vital Signs: BP 103/70 (BP Location: Right Arm)   Pulse 80   Temp (!) 97.4 F (36.3 C) (Oral)   Resp 18   Ht 5\' 1"  (1.549 m)   Wt 75.7 kg (166 lb 12.8 oz)   SpO2 97%   BMI 31.52 kg/m  CXR: Neg CT head, cervical spine and maxillofacial: As above Lab data: Sodium 140, potassium 4.2, chloride 104, CO2 25, glucose 117, BUN 17, creatinine 0.88, magnesium 2.0, LFTs normal, troponin <0.03, white count 8800 with normal differential, hemoglobin 14.8,  platelets 165,000, urinalysis unremarkable except for cloudy appearance.  Review of Systems:  In addition to the HPI above,  No Fever-chills, myalgias or other constitutional symptoms No Headache, changes with Vision or hearing, new weakness, tingling, numbness in any extremity,dysarthria or word finding difficulty, tremors or seizure activity No problems swallowing food or Liquids, indigestion/reflux, choking or coughing while eating, abdominal pain with or after eating No Chest pain, Cough or Shortness of Breath, palpitations, orthopnea or DOE No Abdominal pain, N/V, melena,hematochezia, dark tarry stools, constipation No dysuria, malodorous urine, hematuria or flank pain No new skin rashes, lesions, masses  No new joint pains, aches, swelling or redness No recent unintentional weight gain or loss No polyuria, polydypsia or polyphagia   Past Medical History:  Diagnosis Date  . Atrial fibrillation ([place])    a. on Xarelto  . Chest pain   . COPD (chronic obstructive pulmonary disease) ([place])    family unsure if is copd,   . H/O echocardiogram    a. 08/2016: EF of 60-65%, severely dilated LA, mild pulmonic regurgitations, mild TR, and PA Pressure at 38 mm Hg  . History of depression   . History of DVT (deep vein thrombosis)   . History of pulmonary embolism   . Hyperlipidemia   . Hypertension   . Hypokalemia    Now improved with treatment  . Shortness of breath   . Stroke ([place])   . Tachy-brady syndrome ([place]) 04/01/2017  s/p MDT PPM     Past Surgical History:  Procedure Laterality Date  . PACEMAKER IMPLANT N/A 04/01/2017   Procedure: Pacemaker Implant;  Surgeon: Sanda Klein, MD;  Location: [facility];  Service: Cardiovascular;  Laterality: N/A; MDT [place] XT SR MRI  . REPLACEMENT TOTAL KNEE    . ROTATOR CUFF REPAIR      Social History   Socioeconomic History  . Marital status: Widowed    Spouse name: Not on file  . Number of children: Not on file  . Years of  education: Not on file  . Highest education level: Not on file  Social Needs  . Financial resource strain: Not on file  . Food insecurity - worry: Not on file  . Food insecurity - inability: Not on file  . Transportation needs - medical: Not on file  . Transportation needs - non-medical: Not on file  Occupational History  . Not on file  Tobacco Use  . Smoking status: Never Smoker  . Smokeless tobacco: Never Used  Substance and Sexual Activity  . Alcohol use: No  . Drug use: No  . Sexual activity: No  Other Topics Concern  . Not on file  Social History Narrative  . Not on file    Mobility: Rolling walker Work history: Not obtained   No Known Allergies  Family History  Problem Relation Age of Onset  . Alzheimer's disease Mother   . Heart attack Father   . Diabetes type II Daughter   . Heart disease Daughter        stents     Prior to Admission medications   Medication Sig Start Date End Date Taking? Authorizing Provider  acetaminophen (TYLENOL) 325 MG tablet Take 650 mg by mouth every 6 (six) hours as needed (for pain).    Yes [provider]  ALPRAZolam (XANAX) 0.25 MG tablet Take 1 tablet (0.25 mg total) by mouth 2 (two) times daily as needed for anxiety. 10/21/14  Yes Mikhail, Velta Addison, DO  furosemide (LASIX) 40 MG tablet Take 1 tablet (40 mg total) by mouth every morning. 01/10/17  Yes Domenic Polite, MD  metoprolol (LOPRESSOR) 25 MG tablet Take 1 tablet (25 mg total) by mouth 2 (two) times daily. 06/20/14  Yes Lorretta Harp, MD  Multiple Vitamins-Minerals (WOMENS 50+ [place] VITAMIN/MIN) TABS Take 1 tablet by mouth daily.   Yes [provider]  omeprazole (PRILOSEC) 20 MG capsule Take 20 mg by mouth daily as needed (for reflux/heartburn).  04/23/15  Yes [provider]  potassium chloride SA (K-DUR,KLOR-CON) 20 MEQ tablet Take 40 mEq by mouth daily. Take 2 tablets every morning 01/13/17  Yes Isaiah Serge, NP  pravastatin (PRAVACHOL) 40 MG  tablet Take 40 mg by mouth every morning.    Yes [provider]  Rivaroxaban (XARELTO) 15 MG TABS tablet Take 1 tablet (15 mg total) by mouth every morning. 09/17/17  Yes Lorretta Harp, MD  venlafaxine XR (EFFEXOR-XR) 150 MG 24 hr capsule Take 150 mg by mouth daily.   Yes [provider]  venlafaxine XR (EFFEXOR-XR) 75 MG 24 hr capsule Take 1 capsule by mouth daily. 07/14/17  Yes [provider]  zolpidem (AMBIEN) 5 MG tablet Take 5 mg by mouth at bedtime. 11/03/13  Yes [provider]  nitroGLYCERIN (NITROSTAT) 0.4 MG SL tablet Place 0.4 mg under the tongue as needed for chest pain. X 3 doses 02/08/16   [provider]  OXYGEN Inhale 2 L into  the lungs continuous.     [provider]    Physical Exam: Vitals:   11/02/17 1521 11/02/17 1539 11/02/17 1600 11/02/17 1642  BP:  (!) 155/104 (!) 174/81 103/70  Pulse:  78  80  Resp:  17 17 18   Temp: (!) 97.2 F (36.2 C)   (!) 97.4 F (36.3 C)  TempSrc: Oral   Oral  SpO2:  97%    Weight:    75.7 kg (166 lb 12.8 oz)  Height:    5\' 1"  (1.549 m)      Constitutional: NAD, calm, uncomfortable secondary to swelling of nose with known fracture Eyes: PERRL, right periorbital ecchymosis with bilateral scleral injection ENMT: Mucous membranes are very dry. Posterior pharynx clear of any exudate or lesions.edentulous.  Dried blood noted in upper palate.  Nose edematous with bruising- nasal tampon left nare with bright red blood oozing from same nare Neck: normal, supple, no masses, no thyromegaly Respiratory: clear to auscultation bilaterally, no wheezing, no crackles. Normal respiratory effort. No accessory muscle use.  Cardiovascular: Irregular underlying atrial fibrillation -occasional unifocal PVC, no murmurs / rubs / gallops. No extremity edema. 2+ pedal pulses. No carotid bruits.  Abdomen: no tenderness, no masses palpated. No hepatosplenomegaly. Bowel sounds positive.  Musculoskeletal: no  clubbing / cyanosis. No joint deformity upper and lower extremities. Good ROM, no contractures. Normal muscle tone.  Skin: no rashes, lesions, ulcers. No induration Neurologic: CN 2-12 grossly intact. Sensation intact, DTR normal. Strength 5/5 x all 4 extremities.  Psychiatric: Alert and oriented x 3. Normal mood.    Labs on Admission: I have personally reviewed following labs and imaging studies  CBC: Recent Labs  Lab 11/02/17 1127  WBC 8.8  NEUTROABS 5.8  HGB 14.8  HCT 48.2*  MCV 88.9  PLT 811   Basic Metabolic Panel: Recent Labs  Lab 11/02/17 1127 11/02/17 1636  NA 140  --   K 4.2  --   CL 104  --   CO2 25  --   GLUCOSE 117*  --   BUN 17  --   CREATININE 0.88  --   CALCIUM 9.0  --   MG  --  2.0   GFR: Estimated Creatinine Clearance: 41.9 mL/min (by C-G formula based on SCr of 0.88 mg/dL). Liver Function Tests: Recent Labs  Lab 11/02/17 1127  AST 23  ALT 15  ALKPHOS 100  BILITOT 0.9  PROT 6.9  ALBUMIN 3.7   No results for input(s): LIPASE, AMYLASE in the last 168 hours. No results for input(s): AMMONIA in the last 168 hours. Coagulation Profile: No results for input(s): INR, PROTIME in the last 168 hours. Cardiac Enzymes: Recent Labs  Lab 11/02/17 1127  TROPONINI <0.03   BNP (last 3 results) No results for input(s): PROBNP in the last 8760 hours. HbA1C: No results for input(s): HGBA1C in the last 72 hours. CBG: No results for input(s): GLUCAP in the last 168 hours. Lipid Profile: No results for input(s): CHOL, HDL, LDLCALC, TRIG, CHOLHDL, LDLDIRECT in the last 72 hours. Thyroid Function Tests: No results for input(s): TSH, T4TOTAL, FREET4, T3FREE, THYROIDAB in the last 72 hours. Anemia Panel: No results for input(s): VITAMINB12, FOLATE, FERRITIN, TIBC, IRON, RETICCTPCT in the last 72 hours. Urine analysis:    Component Value Date/Time   COLORURINE YELLOW 11/02/2017 1521   APPEARANCEUR CLOUDY (A) 11/02/2017 1521   LABSPEC 1.010 11/02/2017  1521   PHURINE 8.0 11/02/2017 1521   GLUCOSEU NEGATIVE 11/02/2017 1521   HGBUR NEGATIVE  11/02/2017 1521   [place] 11/02/2017 1521   KETONESUR NEGATIVE 11/02/2017 1521   PROTEINUR NEGATIVE 11/02/2017 1521   UROBILINOGEN 1.0 10/20/2014 1614   NITRITE NEGATIVE 11/02/2017 1521   LEUKOCYTESUR NEGATIVE 11/02/2017 1521   Sepsis Labs: @LABRCNTIP (procalcitonin:4,lacticidven:4) )No results found for this or any previous visit (from the past 240 hour(s)).   Radiological Exams on Admission: Ct Head Wo Contrast  Result Date: 11/02/2017 CLINICAL DATA:  Unwitnessed fall to floor with injury to nose and left orbit. EXAM: CT HEAD WITHOUT CONTRAST CT MAXILLOFACIAL WITHOUT CONTRAST CT CERVICAL SPINE WITHOUT CONTRAST TECHNIQUE: Multidetector CT imaging of the head, cervical spine, and maxillofacial structures were performed using the standard protocol without intravenous contrast. Multiplanar CT image reconstructions of the cervical spine and maxillofacial structures were also generated. COMPARISON:  Head CT 01/08/2017 FINDINGS: CT HEAD FINDINGS Brain: Ventricles, cisterns and other CSF spaces are within normal. There is mild chronic ischemic microvascular disease and minimal age related atrophic change. No mass, mass effect, shift of midline structures or acute hemorrhage. No evidence of acute infarction. Vascular: No hyperdense vessel or unexpected calcification. Skull: Minimally displaced nasal bone fracture. Other: Moderate opacification with air-fluid levels over the sinuses likely hemorrhagic debris. CT MAXILLOFACIAL FINDINGS Osseous: Examination demonstrates a minimally displaced nasal bone fracture. No additional facial bone fractures are identified. Degenerate change of the temporomandibular joints bilaterally. Mild degenerate change of the spine. Orbits: Soft tissue swelling over the right periorbital region. The globes and retrobulbar spaces are normal symmetric. Sinuses: Paranasal sinuses are  well developed as there is opacification with scattered air-fluid levels present likely combination of inflammatory change and hemorrhagic debris. Deviation of the nasal septum to the left. Soft tissues: Right periorbital soft tissue swelling. CT CERVICAL SPINE FINDINGS Alignment: Subtle 2 mm anterior subluxation of C3 on C4 likely degenerative. No traumatic subluxation. Skull base and vertebrae: Vertebral body heights are normal. There is mild to moderate spondylosis of the cervical spine. No evidence of acute fracture. Mild bilateral neural foraminal narrowing at several levels due to adjacent bony spurring. There is moderate uncovertebral joint spurring and facet arthropathy. Soft tissues and spinal canal: Prevertebral soft tissues are normal. Spinal canal is within normal. Disc levels: Disc space narrowing at the C4-5, C5-6 and C6-7 levels. Upper chest: Within normal. Other: None. IMPRESSION: No acute brain injury. Mild chronic ischemic microvascular disease and age related atrophic change. Minimally displaced nasal bone fracture. Right periorbital soft tissue swelling. Combination of inflammatory change and hemorrhagic debris within the paranasal sinuses. No acute cervical spine injury. Moderate spondylosis of the cervical spine with multilevel disc disease. Electronically Signed   By: Marin Olp M.D.   On: 11/02/2017 12:18   Ct Cervical Spine Wo Contrast  Result Date: 11/02/2017 CLINICAL DATA:  Unwitnessed fall to floor with injury to nose and left orbit. EXAM: CT HEAD WITHOUT CONTRAST CT MAXILLOFACIAL WITHOUT CONTRAST CT CERVICAL SPINE WITHOUT CONTRAST TECHNIQUE: Multidetector CT imaging of the head, cervical spine, and maxillofacial structures were performed using the standard protocol without intravenous contrast. Multiplanar CT image reconstructions of the cervical spine and maxillofacial structures were also generated. COMPARISON:  Head CT 01/08/2017 FINDINGS: CT HEAD FINDINGS Brain: Ventricles,  cisterns and other CSF spaces are within normal. There is mild chronic ischemic microvascular disease and minimal age related atrophic change. No mass, mass effect, shift of midline structures or acute hemorrhage. No evidence of acute infarction. Vascular: No hyperdense vessel or unexpected calcification. Skull: Minimally displaced nasal bone fracture. Other: Moderate opacification with air-fluid levels  over the sinuses likely hemorrhagic debris. CT MAXILLOFACIAL FINDINGS Osseous: Examination demonstrates a minimally displaced nasal bone fracture. No additional facial bone fractures are identified. Degenerate change of the temporomandibular joints bilaterally. Mild degenerate change of the spine. Orbits: Soft tissue swelling over the right periorbital region. The globes and retrobulbar spaces are normal symmetric. Sinuses: Paranasal sinuses are well developed as there is opacification with scattered air-fluid levels present likely combination of inflammatory change and hemorrhagic debris. Deviation of the nasal septum to the left. Soft tissues: Right periorbital soft tissue swelling. CT CERVICAL SPINE FINDINGS Alignment: Subtle 2 mm anterior subluxation of C3 on C4 likely degenerative. No traumatic subluxation. Skull base and vertebrae: Vertebral body heights are normal. There is mild to moderate spondylosis of the cervical spine. No evidence of acute fracture. Mild bilateral neural foraminal narrowing at several levels due to adjacent bony spurring. There is moderate uncovertebral joint spurring and facet arthropathy. Soft tissues and spinal canal: Prevertebral soft tissues are normal. Spinal canal is within normal. Disc levels: Disc space narrowing at the C4-5, C5-6 and C6-7 levels. Upper chest: Within normal. Other: None. IMPRESSION: No acute brain injury. Mild chronic ischemic microvascular disease and age related atrophic change. Minimally displaced nasal bone fracture. Right periorbital soft tissue swelling.  Combination of inflammatory change and hemorrhagic debris within the paranasal sinuses. No acute cervical spine injury. Moderate spondylosis of the cervical spine with multilevel disc disease. Electronically Signed   By: Marin Olp M.D.   On: 11/02/2017 12:18   Dg Chest Portable 1 View  Result Date: 11/02/2017 CLINICAL DATA:  Trauma secondary to a fall this morning. Facial lacerations. EXAM: PORTABLE CHEST 1 VIEW COMPARISON:  Chest x-ray dated 08/18/2017 and 07/02/2017 FINDINGS: Chronic cardiomegaly.  Large hiatal hernia.  Aortic atherosclerosis. Chronic slight accentuation of the interstitial markings bilaterally. No acute infiltrates or effusions. Pulmonary vascularity is normal. Pacemaker in place.  No acute bone abnormality. IMPRESSION: No acute abnormality. Chronic issue a shin of the interstitial markings. Huge hiatal hernia. Aortic Atherosclerosis (ICD10-I70.0). Electronically Signed   By: Lorriane Shire M.D.   On: 11/02/2017 11:39   Ct Maxillofacial Wo Contrast  Result Date: 11/02/2017 CLINICAL DATA:  Unwitnessed fall to floor with injury to nose and left orbit. EXAM: CT HEAD WITHOUT CONTRAST CT MAXILLOFACIAL WITHOUT CONTRAST CT CERVICAL SPINE WITHOUT CONTRAST TECHNIQUE: Multidetector CT imaging of the head, cervical spine, and maxillofacial structures were performed using the standard protocol without intravenous contrast. Multiplanar CT image reconstructions of the cervical spine and maxillofacial structures were also generated. COMPARISON:  Head CT 01/08/2017 FINDINGS: CT HEAD FINDINGS Brain: Ventricles, cisterns and other CSF spaces are within normal. There is mild chronic ischemic microvascular disease and minimal age related atrophic change. No mass, mass effect, shift of midline structures or acute hemorrhage. No evidence of acute infarction. Vascular: No hyperdense vessel or unexpected calcification. Skull: Minimally displaced nasal bone fracture. Other: Moderate opacification with  air-fluid levels over the sinuses likely hemorrhagic debris. CT MAXILLOFACIAL FINDINGS Osseous: Examination demonstrates a minimally displaced nasal bone fracture. No additional facial bone fractures are identified. Degenerate change of the temporomandibular joints bilaterally. Mild degenerate change of the spine. Orbits: Soft tissue swelling over the right periorbital region. The globes and retrobulbar spaces are normal symmetric. Sinuses: Paranasal sinuses are well developed as there is opacification with scattered air-fluid levels present likely combination of inflammatory change and hemorrhagic debris. Deviation of the nasal septum to the left. Soft tissues: Right periorbital soft tissue swelling. CT CERVICAL SPINE FINDINGS Alignment: Subtle  2 mm anterior subluxation of C3 on C4 likely degenerative. No traumatic subluxation. Skull base and vertebrae: Vertebral body heights are normal. There is mild to moderate spondylosis of the cervical spine. No evidence of acute fracture. Mild bilateral neural foraminal narrowing at several levels due to adjacent bony spurring. There is moderate uncovertebral joint spurring and facet arthropathy. Soft tissues and spinal canal: Prevertebral soft tissues are normal. Spinal canal is within normal. Disc levels: Disc space narrowing at the C4-5, C5-6 and C6-7 levels. Upper chest: Within normal. Other: None. IMPRESSION: No acute brain injury. Mild chronic ischemic microvascular disease and age related atrophic change. Minimally displaced nasal bone fracture. Right periorbital soft tissue swelling. Combination of inflammatory change and hemorrhagic debris within the paranasal sinuses. No acute cervical spine injury. Moderate spondylosis of the cervical spine with multilevel disc disease. Electronically Signed   By: Marin Olp M.D.   On: 11/02/2017 12:18    EKG: (Independently reviewed) atrial fibrillation with ventricular rate 73 bpm, QTC 502 ms in the context of underlying  left bundle branch block, occasional unifocal PVC, no definitive acute ischemic changes and unchanged from previous  Assessment/Plan Principal Problem:   Syncope w/Fall at home -Patient presents after unwitnessed fall at home presumed to be related to syncopal episode; had similar event on 1/22 and went to the ER but did not require admission -Pacemaker interrogated and no definitive arrhythmogenic etiology to current symptoms -Patient on Lasix and clinical exam consistent with dehydration therefore suspect orthostatic hypotension as etiology -After arrival to floor RN attempted to obtain orthostatic vital signs but patient too dizzy to participate therefore suspect these are positive -Hold diuretics -Gentle IV fluid hydration -We will allow for patient's usual as needed Xanax and bedtime Ambien for now -PT/OT evaluation ordered but after patient arrived to the floor family refused-Dr. Lorin Mercy made aware an order was discontinued  Active Problems:   Nasal fracture -Sustained during presumed syncopal event and fall at home -EDP placed nasal tampon to left nare but continues to have some oozing of blood in the context of requirement for chronic anticoagulation -We will continue Xarelto for now but if continues to have significant bruising or drop in hemoglobin may need to hold and consult ENT    Atrial fibrillation  -Currently rate controlled -Continue preadmission beta-blocker -Continue Xarelto for now (see above) -CHA2DS2-VASc=4 -Has chronic pacemaker secondary to history of TBS    Hypertension/moderate LVH/?  Left ventricular diastolic dysfunction -On Lasix prior to admission-currently on hold secondary to presumed orthostatic hypotension/orthostatic syncope -Last echocardiogram April 2018: Moderate LVH with EF 55%, mild MR, mild AR, no pulmonary hypertension, mild tricuspid regurgitation -Continue preadmission beta-blocker -Not on ARB/ACE inhibitor prior to admission    COPD with  emphysema  -Not actively wheezing -Not on MDI prior to admission    HLD (hyperlipidemia) -Continue statin      DVT prophylaxis: Xarelto Code Status: DNR Family Communication: Family at bedside Disposition Plan: Home Consults called: None    Arlander Gillen L. ANP-BC [facility] Pager [phone]   If 7PM-7AM, please contact night-coverage [email or web address] Password [facility]  11/02/2017, 6:39 PM

## 2017-11-03 DIAGNOSIS — I482 Chronic atrial fibrillation: Secondary | ICD-10-CM

## 2017-11-03 DIAGNOSIS — Y92009 Unspecified place in unspecified non-institutional (private) residence as the place of occurrence of the external cause: Secondary | ICD-10-CM

## 2017-11-03 DIAGNOSIS — W19XXXA Unspecified fall, initial encounter: Secondary | ICD-10-CM

## 2017-11-03 DIAGNOSIS — E7800 Pure hypercholesterolemia, unspecified: Secondary | ICD-10-CM

## 2017-11-03 DIAGNOSIS — I10 Essential (primary) hypertension: Secondary | ICD-10-CM

## 2017-11-03 DIAGNOSIS — J439 Emphysema, unspecified: Secondary | ICD-10-CM

## 2017-11-03 LAB — BASIC METABOLIC PANEL
ANION GAP: 13 (ref 5–15)
BUN: 15 mg/dL (ref 6–20)
CALCIUM: 9 mg/dL (ref 8.9–10.3)
CHLORIDE: 102 mmol/L (ref 101–111)
CO2: 26 mmol/L (ref 22–32)
CREATININE: 0.87 mg/dL (ref 0.44–1.00)
GFR calc non Af Amer: 58 mL/min — ABNORMAL LOW (ref >60)
Glucose, Bld: 128 mg/dL — ABNORMAL HIGH (ref 65–99)
Potassium: 3.9 mmol/L (ref 3.5–5.1)
Sodium: 141 mmol/L (ref 135–145)

## 2017-11-03 LAB — CBC
HEMATOCRIT: 44.2 % (ref 36.0–46.0)
HEMOGLOBIN: 13.7 g/dL (ref 12.0–15.0)
MCH: 27.3 pg (ref 26.0–34.0)
MCHC: 31 g/dL (ref 30.0–36.0)
MCV: 88 fL (ref 78.0–100.0)
Platelets: 164 10*3/uL (ref 150–400)
RBC: 5.02 MIL/uL (ref 3.87–5.11)
RDW: 15.7 % — AB (ref 11.5–15.5)
WBC: 8.6 10*3/uL (ref 4.0–10.5)

## 2017-11-03 LAB — GLUCOSE, CAPILLARY: Glucose-Capillary: 103 mg/dL — ABNORMAL HIGH (ref 65–99)

## 2017-11-03 MED ORDER — VENLAFAXINE HCL ER 75 MG PO CP24
225.0000 mg | ORAL_CAPSULE | Freq: Every day | ORAL | Status: DC
  Administered 2017-11-04: 225 mg via ORAL
  Filled 2017-11-03: qty 3

## 2017-11-03 MED ORDER — VENLAFAXINE HCL ER 75 MG PO CP24
150.0000 mg | ORAL_CAPSULE | Freq: Once | ORAL | Status: AC
  Administered 2017-11-03: 150 mg via ORAL
  Filled 2017-11-03: qty 2

## 2017-11-03 MED ORDER — RIVAROXABAN 15 MG PO TABS
15.0000 mg | ORAL_TABLET | Freq: Every morning | ORAL | Status: DC
  Administered 2017-11-03: 15 mg via ORAL
  Filled 2017-11-03: qty 1

## 2017-11-03 NOTE — Progress Notes (Signed)
MD Rama stated to discontinue q4 hour neuro checks and to complete neuro checks once a shift Care order placed

## 2017-11-03 NOTE — Progress Notes (Signed)
Pt family concerned for effexor dose not matching home dose and concerned for stopping xarelto  Informed unit pharmacist Pharmacist came to bedside to discuss with family  Pharmacist stated she talked to MD Rama and she will be resuming home dose effexor and xarelto

## 2017-11-03 NOTE — Consult Note (Signed)
Cardiology Consultation:   Patient ID: Raven Gray; [id number]; 12-Feb-1928   Admit date: 11/02/2017 Date of Consult: 11/03/2017  Primary Care Provider: [name], MD Primary Cardiologist: Dr. Gwenlyn Found / [facility] (pacemaker)  Chief Complaint: fall, bleeding from nose  Patient Profile:   Raven Gray is a 84 y.o. female with a hx of remote history of PE 2013 and DVT on Xarelto, HTN, HLD, COPD, permanent atrial fib with long pauses s/p PPM 03/2017, atypical chest pain, intracranial cerebrovascular disease/occult stroke by MRI 2018, hyperglycemia (A1C 6.5 in 12/2016), chronic oxygen therapy (2L per daughter) who is being seen today for the evaluation/discussion regarding anticoagulation at the request of Dr. Rockne Menghini.  History of Present Illness:   She has history of atypical chest pain. Remote chest CT 2014 showed atherosclerosis in the LM and LAD; Dr. Gwenlyn Found previously offered cath but the patient declined.  Last nuc in 2017 was normal and she's been treated with empiric therapy with Isosorbide. Her atrial fib was diagnosed in 2015 after complaining of sluggishness. She also had reported history of falling and syncope. 2D echo 12/2016 showed moderate LVH, EF 55%, mild AI, mildly ectatic ascending aorta, mild LAE, mild MR/TR.  A follow-up event monitor showed slow ventricular response with pauses; therefore she underwent PPM in 03/2017 by Dr. Sallyanne Kuster.  With regard to prior stroke history she was not previously known to have one clinically but in 2017 did have an episode of slurred speech and blurred vision. In 2018 she had dizziness and slurred speech at which time MRI showed no acute infarct, but subacute appearing ischemia in the medial left occipital lobe, superimposed on a chronic inferior left occipital lobe infarct, & stable chronic moderate M1 stenosis.  The patient is alert but intermittently pleasantly confused at times which the daughter states has been intermittent since the last 6 months. The patient  started out by introducing me to people in the room who aren't there and told me she's tired because "the baby kept me up all night long." Therefore, history is provided by the daughter who reports a general decline in all aspects - weakness, cognition, falling - over the last 6 months ever since a bout of pneumonia. The patient lives alone but has had 7 falls during that time. Daughter reports PT previously was unhelpful. The majority of these were unwitnessed and the patient has poor recollection to be able to report what happened. Several were in her bedroom which they presume was due to proprioception errors getting out of bed. Some were trip and falls. They aren't sure if she's passed out because the patient isn't able to tell them either. On day of admission she apparently was in the kitchen found down by a mobile meal delivery service with a table flipped over and a nasal fracture with bleeding. She had to have nasal packing placed and has oculofacial bruising. The patient doesn't remember anything about the event. She denies any CP, SOB, dizziness. Labs reveal stable hemoglobin, mildly elevated blood sugar, normal potassium and magnesium, and negative troponin. She was also recently in the ER 1/22 with a fall which family thought maybe she had passed out but again unclear. As of last device check 10/19/17 she was felt to have permanent atrial fib with normal pacemaker function and no specific events.   Orthostatics notable for drop in BP from 125/90->102/70 and pulse 59->84, although pressures appear extremely labile this admission all the way up to 213/91 down to 103/70. She was on Lasix  at home and this has been held. She was also treated with IV fluids. CHADSVASC 7 for HTN, age, occult stroke on MRI, vascular disease (cor calcification on CT), female; also may be 8 for DM as A1C in 2018 was 6.5. The family wishes to keep their mother at home and have made arrangements to provide 24/7 care for her. It is  evident to them she is not really capable of living alone or reliably using her LifeAlert anymore.  Past Medical History:  Diagnosis Date  . Atrial fibrillation ([place])    a. on Xarelto  . Chest pain   . COPD (chronic obstructive pulmonary disease) ([place])    family unsure if is copd,   . H/O echocardiogram    a. 08/2016: EF of 60-65%, severely dilated LA, mild pulmonic regurgitations, mild TR, and PA Pressure at 38 mm Hg  . History of depression   . History of DVT (deep vein thrombosis)   . History of pulmonary embolism   . Hyperlipidemia   . Hypertension   . Hypokalemia    Now improved with treatment  . Shortness of breath   . Stroke ([place])   . Tachy-brady syndrome ([place]) 04/01/2017   s/p MDT PPM     Past Surgical History:  Procedure Laterality Date  . PACEMAKER IMPLANT N/A 04/01/2017   Procedure: Pacemaker Implant;  Surgeon: Sanda Klein, MD;  Location: [facility];  Service: Cardiovascular;  Laterality: N/A; MDT Azure XT SR MRI  . REPLACEMENT TOTAL KNEE    . ROTATOR CUFF REPAIR       Inpatient Medications: Scheduled Meds: . metoprolol tartrate  25 mg Oral BID  . pantoprazole  40 mg Oral Daily  . pravastatin  40 mg Oral q morning - 10a  . Rivaroxaban  15 mg Oral q morning - 10a  . sodium chloride flush  3 mL Intravenous Q12H  . [START ON 11/04/2017] venlafaxine XR  225 mg Oral Daily   Continuous Infusions: . sodium chloride 75 mL/hr at 11/02/17 1743   PRN Meds: acetaminophen **OR** acetaminophen, ALPRAZolam, ondansetron **OR** ondansetron (ZOFRAN) IV  Home Meds: Prior to Admission medications   Medication Sig Start Date End Date Taking? Authorizing Provider  acetaminophen (TYLENOL) 325 MG tablet Take 650 mg by mouth every 6 (six) hours as needed (for pain).    Yes [provider]  ALPRAZolam (XANAX) 0.25 MG tablet Take 1 tablet (0.25 mg total) by mouth 2 (two) times daily as needed for anxiety. 10/21/14  Yes Mikhail, Velta Addison, DO  furosemide (LASIX) 40  MG tablet Take 1 tablet (40 mg total) by mouth every morning. 01/10/17  Yes Domenic Polite, MD  metoprolol (LOPRESSOR) 25 MG tablet Take 1 tablet (25 mg total) by mouth 2 (two) times daily. 06/20/14  Yes Lorretta Harp, MD  Multiple Vitamins-Minerals (WOMENS 50+ [place] VITAMIN/MIN) TABS Take 1 tablet by mouth daily.   Yes [provider]  omeprazole (PRILOSEC) 20 MG capsule Take 20 mg by mouth daily as needed (for reflux/heartburn).  04/23/15  Yes [provider]  potassium chloride SA (K-DUR,KLOR-CON) 20 MEQ tablet Take 40 mEq by mouth daily. Take 2 tablets every morning 01/13/17  Yes Isaiah Serge, NP  pravastatin (PRAVACHOL) 40 MG tablet Take 40 mg by mouth every morning.    Yes [provider]  Rivaroxaban (XARELTO) 15 MG TABS tablet Take 1 tablet (15 mg total) by mouth every morning. 09/17/17  Yes Lorretta Harp, MD  venlafaxine XR (EFFEXOR-XR) 150 MG  24 hr capsule Take 150 mg by mouth daily.   Yes [provider]  venlafaxine XR (EFFEXOR-XR) 75 MG 24 hr capsule Take 1 capsule by mouth daily. 07/14/17  Yes [provider]  zolpidem (AMBIEN) 5 MG tablet Take 5 mg by mouth at bedtime. 11/03/13  Yes [provider]  nitroGLYCERIN (NITROSTAT) 0.4 MG SL tablet Place 0.4 mg under the tongue as needed for chest pain. X 3 doses 02/08/16   [provider]  OXYGEN Inhale 2 L into the lungs continuous.     [provider]    Allergies:   No Known Allergies  Social History:   Social History   Socioeconomic History  . Marital status: Widowed    Spouse name: Not on file  . Number of children: Not on file  . Years of education: Not on file  . Highest education level: Not on file  Social Needs  . Financial resource strain: Not on file  . Food insecurity - worry: Not on file  . Food insecurity - inability: Not on file  . Transportation needs - medical: Not on file  . Transportation needs - non-medical: Not on file    Occupational History  . Not on file  Tobacco Use  . Smoking status: Never Smoker  . Smokeless tobacco: Never Used  Substance and Sexual Activity  . Alcohol use: No  . Drug use: No  . Sexual activity: No  Other Topics Concern  . Not on file  Social History Narrative  . Not on file    Family History:   The patient's family history includes Alzheimer's disease in her mother; Diabetes type II in her daughter; Heart attack in her father; Heart disease in her daughter.  ROS:  Please see the history of present illness.  All other ROS reviewed and negative.     Physical Exam/Data:   Vitals:   11/02/17 1600 11/02/17 1642 11/02/17 2210 11/03/17 0618  BP: (!) 174/81 103/70 (!) 154/81   Pulse:  80 80   Resp: 17 18 20 18   Temp:  (!) 97.4 F (36.3 C) 97.6 F (36.4 C) 98.3 F (36.8 C)  TempSrc:  Oral Oral Oral  SpO2:   95%   Weight:  166 lb 12.8 oz (75.7 kg)  164 lb 14.4 oz (74.8 kg)  Height:  5\' 1"  (1.549 m)      Intake/Output Summary (Last 24 hours) at 11/03/2017 1145 Last data filed at 11/03/2017 0630 Gross per 24 hour  Intake 120 ml  Output 1000 ml  Net -880 ml   Filed Weights   11/02/17 1147 11/02/17 1642 11/03/17 0618  Weight: 173 lb (78.5 kg) 166 lb 12.8 oz (75.7 kg) 164 lb 14.4 oz (74.8 kg)   Body mass index is 31.16 kg/m.  General: Frail appearing elderly WF, in no acute distress. Head: Normocephalic, sclera non-icteric, no xanthomas Ocularfacial ecchymosis and nasal packing with extensive dried blood in place Neck: Negative for carotid bruits. JVD not elevated. Lungs: Clear bilaterally to auscultation without wheezes, rales, or rhonchi. Breathing is unlabored. Heart: Irregularly irregular with S1 S2. No murmurs, rubs, or gallops appreciated. Abdomen: Soft, non-tender, non-distended with normoactive bowel sounds. No hepatomegaly. No rebound/guarding. No obvious abdominal masses. Msk:  Strength and tone appear normal for age. Extremities: No clubbing or cyanosis. No  edema.  Distal pedal pulses are 2+ and equal bilaterally. Neuro: Alert and oriented X 3. No facial asymmetry. No focal deficit. Moves all extremities spontaneously. Psych:  Responds to  questions appropriately with a normal affect.  EKG:  The EKG was personally reviewed and demonstrates atrial fib 79bpm RBBB and LPFB, diffuse ST-T changes appears similar to prior Telemetry: atrial fib with occ V pacing, rate controlled, pacemaker appears to be responding appropriately  Relevant CV Studies: Referenced above.  Laboratory Data:  Chemistry Recent Labs  Lab 11/02/17 1127 11/03/17 0442  NA 140 141  K 4.2 3.9  CL 104 102  CO2 25 26  GLUCOSE 117* 128*  BUN 17 15  CREATININE 0.88 0.87  CALCIUM 9.0 9.0  GFRNONAA 57* 58*  GFRAA >60 >60  ANIONGAP 11 13    Recent Labs  Lab 11/02/17 1127  PROT 6.9  ALBUMIN 3.7  AST 23  ALT 15  ALKPHOS 100  BILITOT 0.9   Hematology Recent Labs  Lab 11/02/17 1127 11/03/17 0442  WBC 8.8 8.6  RBC 5.42* 5.02  HGB 14.8 13.7  HCT 48.2* 44.2  MCV 88.9 88.0  MCH 27.3 27.3  MCHC 30.7 31.0  RDW 15.4 15.7*  PLT 165 164   Cardiac Enzymes Recent Labs  Lab 11/02/17 1127  TROPONINI <0.03   No results for input(s): TROPIPOC in the last 168 hours.   Radiology/Studies:  Ct Head Wo Contrast  Result Date: 11/02/2017 CLINICAL DATA:  Unwitnessed fall to floor with injury to nose and left orbit. EXAM: CT HEAD WITHOUT CONTRAST CT MAXILLOFACIAL WITHOUT CONTRAST CT CERVICAL SPINE WITHOUT CONTRAST TECHNIQUE: Multidetector CT imaging of the head, cervical spine, and maxillofacial structures were performed using the standard protocol without intravenous contrast. Multiplanar CT image reconstructions of the cervical spine and maxillofacial structures were also generated. COMPARISON:  Head CT 01/08/2017 FINDINGS: CT HEAD FINDINGS Brain: Ventricles, cisterns and other CSF spaces are within normal. There is mild chronic ischemic microvascular disease and minimal age  related atrophic change. No mass, mass effect, shift of midline structures or acute hemorrhage. No evidence of acute infarction. Vascular: No hyperdense vessel or unexpected calcification. Skull: Minimally displaced nasal bone fracture. Other: Moderate opacification with air-fluid levels over the sinuses likely hemorrhagic debris. CT MAXILLOFACIAL FINDINGS Osseous: Examination demonstrates a minimally displaced nasal bone fracture. No additional facial bone fractures are identified. Degenerate change of the temporomandibular joints bilaterally. Mild degenerate change of the spine. Orbits: Soft tissue swelling over the right periorbital region. The globes and retrobulbar spaces are normal symmetric. Sinuses: Paranasal sinuses are well developed as there is opacification with scattered air-fluid levels present likely combination of inflammatory change and hemorrhagic debris. Deviation of the nasal septum to the left. Soft tissues: Right periorbital soft tissue swelling. CT CERVICAL SPINE FINDINGS Alignment: Subtle 2 mm anterior subluxation of C3 on C4 likely degenerative. No traumatic subluxation. Skull base and vertebrae: Vertebral body heights are normal. There is mild to moderate spondylosis of the cervical spine. No evidence of acute fracture. Mild bilateral neural foraminal narrowing at several levels due to adjacent bony spurring. There is moderate uncovertebral joint spurring and facet arthropathy. Soft tissues and spinal canal: Prevertebral soft tissues are normal. Spinal canal is within normal. Disc levels: Disc space narrowing at the C4-5, C5-6 and C6-7 levels. Upper chest: Within normal. Other: None. IMPRESSION: No acute brain injury. Mild chronic ischemic microvascular disease and age related atrophic change. Minimally displaced nasal bone fracture. Right periorbital soft tissue swelling. Combination of inflammatory change and hemorrhagic debris within the paranasal sinuses. No acute cervical spine injury.  Moderate spondylosis of the cervical spine with multilevel disc disease. Electronically Signed   By: Marin Olp  M.D.   On: 11/02/2017 12:18   Ct Cervical Spine Wo Contrast  Result Date: 11/02/2017 CLINICAL DATA:  Unwitnessed fall to floor with injury to nose and left orbit. EXAM: CT HEAD WITHOUT CONTRAST CT MAXILLOFACIAL WITHOUT CONTRAST CT CERVICAL SPINE WITHOUT CONTRAST TECHNIQUE: Multidetector CT imaging of the head, cervical spine, and maxillofacial structures were performed using the standard protocol without intravenous contrast. Multiplanar CT image reconstructions of the cervical spine and maxillofacial structures were also generated. COMPARISON:  Head CT 01/08/2017 FINDINGS: CT HEAD FINDINGS Brain: Ventricles, cisterns and other CSF spaces are within normal. There is mild chronic ischemic microvascular disease and minimal age related atrophic change. No mass, mass effect, shift of midline structures or acute hemorrhage. No evidence of acute infarction. Vascular: No hyperdense vessel or unexpected calcification. Skull: Minimally displaced nasal bone fracture. Other: Moderate opacification with air-fluid levels over the sinuses likely hemorrhagic debris. CT MAXILLOFACIAL FINDINGS Osseous: Examination demonstrates a minimally displaced nasal bone fracture. No additional facial bone fractures are identified. Degenerate change of the temporomandibular joints bilaterally. Mild degenerate change of the spine. Orbits: Soft tissue swelling over the right periorbital region. The globes and retrobulbar spaces are normal symmetric. Sinuses: Paranasal sinuses are well developed as there is opacification with scattered air-fluid levels present likely combination of inflammatory change and hemorrhagic debris. Deviation of the nasal septum to the left. Soft tissues: Right periorbital soft tissue swelling. CT CERVICAL SPINE FINDINGS Alignment: Subtle 2 mm anterior subluxation of C3 on C4 likely degenerative. No  traumatic subluxation. Skull base and vertebrae: Vertebral body heights are normal. There is mild to moderate spondylosis of the cervical spine. No evidence of acute fracture. Mild bilateral neural foraminal narrowing at several levels due to adjacent bony spurring. There is moderate uncovertebral joint spurring and facet arthropathy. Soft tissues and spinal canal: Prevertebral soft tissues are normal. Spinal canal is within normal. Disc levels: Disc space narrowing at the C4-5, C5-6 and C6-7 levels. Upper chest: Within normal. Other: None. IMPRESSION: No acute brain injury. Mild chronic ischemic microvascular disease and age related atrophic change. Minimally displaced nasal bone fracture. Right periorbital soft tissue swelling. Combination of inflammatory change and hemorrhagic debris within the paranasal sinuses. No acute cervical spine injury. Moderate spondylosis of the cervical spine with multilevel disc disease. Electronically Signed   By: Marin Olp M.D.   On: 11/02/2017 12:18   Dg Chest Portable 1 View  Result Date: 11/02/2017 CLINICAL DATA:  Trauma secondary to a fall this morning. Facial lacerations. EXAM: PORTABLE CHEST 1 VIEW COMPARISON:  Chest x-ray dated 08/18/2017 and 07/02/2017 FINDINGS: Chronic cardiomegaly.  Large hiatal hernia.  Aortic atherosclerosis. Chronic slight accentuation of the interstitial markings bilaterally. No acute infiltrates or effusions. Pulmonary vascularity is normal. Pacemaker in place.  No acute bone abnormality. IMPRESSION: No acute abnormality. Chronic issue a shin of the interstitial markings. Huge hiatal hernia. Aortic Atherosclerosis (ICD10-I70.0). Electronically Signed   By: Lorriane Shire M.D.   On: 11/02/2017 11:39   Ct Maxillofacial Wo Contrast  Result Date: 11/02/2017 CLINICAL DATA:  Unwitnessed fall to floor with injury to nose and left orbit. EXAM: CT HEAD WITHOUT CONTRAST CT MAXILLOFACIAL WITHOUT CONTRAST CT CERVICAL SPINE WITHOUT CONTRAST TECHNIQUE:  Multidetector CT imaging of the head, cervical spine, and maxillofacial structures were performed using the standard protocol without intravenous contrast. Multiplanar CT image reconstructions of the cervical spine and maxillofacial structures were also generated. COMPARISON:  Head CT 01/08/2017 FINDINGS: CT HEAD FINDINGS Brain: Ventricles, cisterns and other CSF spaces are within normal.  There is mild chronic ischemic microvascular disease and minimal age related atrophic change. No mass, mass effect, shift of midline structures or acute hemorrhage. No evidence of acute infarction. Vascular: No hyperdense vessel or unexpected calcification. Skull: Minimally displaced nasal bone fracture. Other: Moderate opacification with air-fluid levels over the sinuses likely hemorrhagic debris. CT MAXILLOFACIAL FINDINGS Osseous: Examination demonstrates a minimally displaced nasal bone fracture. No additional facial bone fractures are identified. Degenerate change of the temporomandibular joints bilaterally. Mild degenerate change of the spine. Orbits: Soft tissue swelling over the right periorbital region. The globes and retrobulbar spaces are normal symmetric. Sinuses: Paranasal sinuses are well developed as there is opacification with scattered air-fluid levels present likely combination of inflammatory change and hemorrhagic debris. Deviation of the nasal septum to the left. Soft tissues: Right periorbital soft tissue swelling. CT CERVICAL SPINE FINDINGS Alignment: Subtle 2 mm anterior subluxation of C3 on C4 likely degenerative. No traumatic subluxation. Skull base and vertebrae: Vertebral body heights are normal. There is mild to moderate spondylosis of the cervical spine. No evidence of acute fracture. Mild bilateral neural foraminal narrowing at several levels due to adjacent bony spurring. There is moderate uncovertebral joint spurring and facet arthropathy. Soft tissues and spinal canal: Prevertebral soft tissues are  normal. Spinal canal is within normal. Disc levels: Disc space narrowing at the C4-5, C5-6 and C6-7 levels. Upper chest: Within normal. Other: None. IMPRESSION: No acute brain injury. Mild chronic ischemic microvascular disease and age related atrophic change. Minimally displaced nasal bone fracture. Right periorbital soft tissue swelling. Combination of inflammatory change and hemorrhagic debris within the paranasal sinuses. No acute cervical spine injury. Moderate spondylosis of the cervical spine with multilevel disc disease. Electronically Signed   By: Marin Olp M.D.   On: 11/02/2017 12:18    Assessment and Plan:   1. Permanent atrial fib with history of pauses s/p PPM 03/2017 2. Recurrent falling and weakness 3. Labile HTN with evidence of orthostatic changes - diuretic on hold 4. Confusion x 6 months' time 5. Occult stroke by remote MRI (CHADSVASC score 7-8 - has not formally been given dx of DM but prior A1C 6.5)  Long discussion with daughter at bedside. Obviously there is no ideal answer here as both sides - anticoagulation or no anticoagulation - present risks. She has high CHADSVASC score. However, with 7 falls in 6 months time as well as weakness and general cognitive decline, I am concerned about continuing long-term anticoagulation in this patient. Apparently the family raised some concern about IM holding it, but in speaking with the daughter at beside who lives locally, she too is concerned about risk of resuming anticoagulation. This fall was the worst one she's had so far. I will plan to review further with Dr. Gwenlyn Found who has known the patient for many years. I have also asked our cardmaster to request PPM interrogation by [employer] just to be sure no changes contributed to this past event, but prior device interrogations have been normal in the setting of falls so I doubt this is the case. The patient and family reportedly refused PT on admission yesterday but I truly am concerned for  their ability to care for her in the home. Would offer this again prior to discharge as I feel she may likely need assistive devices beyond a walker at home to help with safety as best as possible. Family and patient report overall desire to focus on maintaining quality of life and comfort rather than invasive measures. IM reports  code status as DNR.  Addendum: Pacemaker interrogation was normal. Device is functioning fine with no episodes.  For questions or updates, please contact [place] Please consult [email or web address] for contact info under Cardiology/STEMI.   Signed, Charlie Pitter, PA-C  11/03/2017 11:45 AM  Agree with findings by Melina Copa PA-C  Patient well-known to me with initial fib status post expectorant implantation. She is 84 years old with mild to moderate dementia. She does live alone and has had multiple falls probably mechanical in nature. I think oral anticoagulation is too high risk at this point. We discussed this with the patient and her daughter [name].. I also talked about the possibility of transitioning to an assisted care facility however the patient and daughter wish to have family in her home 24 7 which I think is a good alternative.  Lorretta Harp, M.D., [place], [facility], [place] [address]. [place], [place]  [id number]  [phone] 11/03/2017 2:20 PM

## 2017-11-03 NOTE — Progress Notes (Addendum)
Progress Note    Raven Gray  TGG:[id number] DOB: 1928-02-19  DOA: 11/02/2017 PCP: [name], MD    Brief Narrative:   Chief complaint: Follow-up syncope/collapse with facial injury  Medical records reviewed and are as summarized below:  Raven Gray is an 84 y.o. female with a PMH of tachybradycardia syndrome status post pacemaker, CVA, hypertension, hyperlipidemia, VTE, atrial fibrillation on chronic Xarelto, COPD who was brought to the hospital after suffering from an unwitnessed fall resulting in significant facial injuries and a nasal fracture.  Assessment/Plan:   Principal Problem:   Syncope with collapse/fall at home Continue to monitor on telemetry. Pacemaker interrogation did not show any evidence of arrhythmic events. Cardiology consultation pending. Possibly orthostatic with dehydration as the underlying etiology as she is on chronic diuretics. Diuretics currently on hold and patient provided with gentle hydration overnight. BUN/creatinine ratio not markedly elevated. Blood pressures are labile. Family declines PT evaluation -- report she was noncompliant with PT in the past. Long discussion held with the patient's daughter regarding her mother's overall condition and deterioration of functional status over time. Family wants to keep her at home and will arrange for 24/7 care. Patient's daughter is open to hospice if her condition remains tenuous. She falls 2-3 times per week.  Active Problems:   Atrial fibrillation (HCC) CHA2DS2-VASc=7. High risk for stroke, but with fall resulting in significant facial trauma, may need to consider risk/benefit ratio. Xarelto held on admission in the setting of epistaxis and nasal fracture. Okay to resume pending further discussion with cardiologist regarding risk/benefit ratio.    Nasal fracture Nasal rocket placed in left naris and Xarelto held yesterday. The bleeding has stopped. Will need nasal packing removed.    Hypertension Blood  pressure somewhat labile. Continue to monitor and make adjustments to antihypertensive therapy as needed.    COPD with emphysema ([place]) Stable with no evidence of wheezing on admission.    HLD (hyperlipidemia) Continue statin therapy.  Body mass index is 31.16 kg/m.   Family Communication/Anticipated D/C date and plan/Code Status   DVT prophylaxis: Xarelto/SCDs. Code Status: DO NOT RESUSCITATE.  Family Communication: Daughter at the bedside. Disposition Plan: Home when stable.   Medical Consultants:    Cardiology   Anti-Infectives:    None  Subjective:   Raven Gray is sleeping soundly. She has had significant pain in her facial area per nursing reports.   Objective:    Vitals:   11/02/17 1600 11/02/17 1642 11/02/17 2210 11/03/17 0618  BP: (!) 174/81 103/70 (!) 154/81   Pulse:  80 80   Resp: 17 18 20 18   Temp:  (!) 97.4 F (36.3 C) 97.6 F (36.4 C) 98.3 F (36.8 C)  TempSrc:  Oral Oral Oral  SpO2:   95%   Weight:  75.7 kg (166 lb 12.8 oz)  74.8 kg (164 lb 14.4 oz)  Height:  5\' 1"  (1.549 m)      Intake/Output Summary (Last 24 hours) at 11/03/2017 0849 Last data filed at 11/03/2017 0630 Gross per 24 hour  Intake 120 ml  Output 1000 ml  Net -880 ml   Filed Weights   11/02/17 1147 11/02/17 1642 11/03/17 0618  Weight: 78.5 kg (173 lb) 75.7 kg (166 lb 12.8 oz) 74.8 kg (164 lb 14.4 oz)    Exam: General: No acute distress. Sleeping soundly with snoring respirations. Extensive facial trauma with periorbital ecchymosis, nasal swelling, and dried blood around nasal area. Left-sided nasal packing in place. Cardiovascular: HSIR.  No gallops or rubs. No murmurs. No JVD. Lungs: Coarse bilaterally with snoring respirations and mildly labored breathing. Abdomen: Soft, nontender, nondistended with normal active bowel sounds. No masses. No hepatosplenomegaly. Neurological: Lethargic/asleep. Skin: Facial trauma as described above. Extremities: No clubbing or cyanosis. No  edema. Pedal pulses 2+. Psychiatric: Unable to assess.   Data Reviewed:   I have personally reviewed following labs and imaging studies:  Labs: Labs show the following:   Basic Metabolic Panel: Recent Labs  Lab 11/02/17 1127 11/02/17 1636 11/03/17 0442  NA 140  --  141  K 4.2  --  3.9  CL 104  --  102  CO2 25  --  26  GLUCOSE 117*  --  128*  BUN 17  --  15  CREATININE 0.88  --  0.87  CALCIUM 9.0  --  9.0  MG  --  2.0  --    GFR Estimated Creatinine Clearance: 42.1 mL/min (by C-G formula based on SCr of 0.87 mg/dL). Liver Function Tests: Recent Labs  Lab 11/02/17 1127  AST 23  ALT 15  ALKPHOS 100  BILITOT 0.9  PROT 6.9  ALBUMIN 3.7   CBC: Recent Labs  Lab 11/02/17 1127 11/03/17 0442  WBC 8.8 8.6  NEUTROABS 5.8  --   HGB 14.8 13.7  HCT 48.2* 44.2  MCV 88.9 88.0  PLT 165 164   Cardiac Enzymes: Recent Labs  Lab 11/02/17 1127  TROPONINI <0.03   CBG: Recent Labs  Lab 11/03/17 0653  GLUCAP 103*    Microbiology No results found for this or any previous visit (from the past 240 hour(s)).  Procedures and diagnostic studies:  Ct Head Wo Contrast  Result Date: 11/02/2017 CLINICAL DATA:  Unwitnessed fall to floor with injury to nose and left orbit. EXAM: CT HEAD WITHOUT CONTRAST CT MAXILLOFACIAL WITHOUT CONTRAST CT CERVICAL SPINE WITHOUT CONTRAST TECHNIQUE: Multidetector CT imaging of the head, cervical spine, and maxillofacial structures were performed using the standard protocol without intravenous contrast. Multiplanar CT image reconstructions of the cervical spine and maxillofacial structures were also generated. COMPARISON:  Head CT 01/08/2017 FINDINGS: CT HEAD FINDINGS Brain: Ventricles, cisterns and other CSF spaces are within normal. There is mild chronic ischemic microvascular disease and minimal age related atrophic change. No mass, mass effect, shift of midline structures or acute hemorrhage. No evidence of acute infarction. Vascular: No  hyperdense vessel or unexpected calcification. Skull: Minimally displaced nasal bone fracture. Other: Moderate opacification with air-fluid levels over the sinuses likely hemorrhagic debris. CT MAXILLOFACIAL FINDINGS Osseous: Examination demonstrates a minimally displaced nasal bone fracture. No additional facial bone fractures are identified. Degenerate change of the temporomandibular joints bilaterally. Mild degenerate change of the spine. Orbits: Soft tissue swelling over the right periorbital region. The globes and retrobulbar spaces are normal symmetric. Sinuses: Paranasal sinuses are well developed as there is opacification with scattered air-fluid levels present likely combination of inflammatory change and hemorrhagic debris. Deviation of the nasal septum to the left. Soft tissues: Right periorbital soft tissue swelling. CT CERVICAL SPINE FINDINGS Alignment: Subtle 2 mm anterior subluxation of C3 on C4 likely degenerative. No traumatic subluxation. Skull base and vertebrae: Vertebral body heights are normal. There is mild to moderate spondylosis of the cervical spine. No evidence of acute fracture. Mild bilateral neural foraminal narrowing at several levels due to adjacent bony spurring. There is moderate uncovertebral joint spurring and facet arthropathy. Soft tissues and spinal canal: Prevertebral soft tissues are normal. Spinal canal is within normal. Disc levels: Disc  space narrowing at the C4-5, C5-6 and C6-7 levels. Upper chest: Within normal. Other: None. IMPRESSION: No acute brain injury. Mild chronic ischemic microvascular disease and age related atrophic change. Minimally displaced nasal bone fracture. Right periorbital soft tissue swelling. Combination of inflammatory change and hemorrhagic debris within the paranasal sinuses. No acute cervical spine injury. Moderate spondylosis of the cervical spine with multilevel disc disease. Electronically Signed   By: Marin Olp M.D.   On: 11/02/2017  12:18   Ct Cervical Spine Wo Contrast  Result Date: 11/02/2017 CLINICAL DATA:  Unwitnessed fall to floor with injury to nose and left orbit. EXAM: CT HEAD WITHOUT CONTRAST CT MAXILLOFACIAL WITHOUT CONTRAST CT CERVICAL SPINE WITHOUT CONTRAST TECHNIQUE: Multidetector CT imaging of the head, cervical spine, and maxillofacial structures were performed using the standard protocol without intravenous contrast. Multiplanar CT image reconstructions of the cervical spine and maxillofacial structures were also generated. COMPARISON:  Head CT 01/08/2017 FINDINGS: CT HEAD FINDINGS Brain: Ventricles, cisterns and other CSF spaces are within normal. There is mild chronic ischemic microvascular disease and minimal age related atrophic change. No mass, mass effect, shift of midline structures or acute hemorrhage. No evidence of acute infarction. Vascular: No hyperdense vessel or unexpected calcification. Skull: Minimally displaced nasal bone fracture. Other: Moderate opacification with air-fluid levels over the sinuses likely hemorrhagic debris. CT MAXILLOFACIAL FINDINGS Osseous: Examination demonstrates a minimally displaced nasal bone fracture. No additional facial bone fractures are identified. Degenerate change of the temporomandibular joints bilaterally. Mild degenerate change of the spine. Orbits: Soft tissue swelling over the right periorbital region. The globes and retrobulbar spaces are normal symmetric. Sinuses: Paranasal sinuses are well developed as there is opacification with scattered air-fluid levels present likely combination of inflammatory change and hemorrhagic debris. Deviation of the nasal septum to the left. Soft tissues: Right periorbital soft tissue swelling. CT CERVICAL SPINE FINDINGS Alignment: Subtle 2 mm anterior subluxation of C3 on C4 likely degenerative. No traumatic subluxation. Skull base and vertebrae: Vertebral body heights are normal. There is mild to moderate spondylosis of the cervical  spine. No evidence of acute fracture. Mild bilateral neural foraminal narrowing at several levels due to adjacent bony spurring. There is moderate uncovertebral joint spurring and facet arthropathy. Soft tissues and spinal canal: Prevertebral soft tissues are normal. Spinal canal is within normal. Disc levels: Disc space narrowing at the C4-5, C5-6 and C6-7 levels. Upper chest: Within normal. Other: None. IMPRESSION: No acute brain injury. Mild chronic ischemic microvascular disease and age related atrophic change. Minimally displaced nasal bone fracture. Right periorbital soft tissue swelling. Combination of inflammatory change and hemorrhagic debris within the paranasal sinuses. No acute cervical spine injury. Moderate spondylosis of the cervical spine with multilevel disc disease. Electronically Signed   By: Marin Olp M.D.   On: 11/02/2017 12:18   Dg Chest Portable 1 View  Result Date: 11/02/2017 CLINICAL DATA:  Trauma secondary to a fall this morning. Facial lacerations. EXAM: PORTABLE CHEST 1 VIEW COMPARISON:  Chest x-ray dated 08/18/2017 and 07/02/2017 FINDINGS: Chronic cardiomegaly.  Large hiatal hernia.  Aortic atherosclerosis. Chronic slight accentuation of the interstitial markings bilaterally. No acute infiltrates or effusions. Pulmonary vascularity is normal. Pacemaker in place.  No acute bone abnormality. IMPRESSION: No acute abnormality. Chronic issue a shin of the interstitial markings. Huge hiatal hernia. Aortic Atherosclerosis (ICD10-I70.0). Electronically Signed   By: Lorriane Shire M.D.   On: 11/02/2017 11:39   Ct Maxillofacial Wo Contrast  Result Date: 11/02/2017 CLINICAL DATA:  Unwitnessed fall to floor with  injury to nose and left orbit. EXAM: CT HEAD WITHOUT CONTRAST CT MAXILLOFACIAL WITHOUT CONTRAST CT CERVICAL SPINE WITHOUT CONTRAST TECHNIQUE: Multidetector CT imaging of the head, cervical spine, and maxillofacial structures were performed using the standard protocol without  intravenous contrast. Multiplanar CT image reconstructions of the cervical spine and maxillofacial structures were also generated. COMPARISON:  Head CT 01/08/2017 FINDINGS: CT HEAD FINDINGS Brain: Ventricles, cisterns and other CSF spaces are within normal. There is mild chronic ischemic microvascular disease and minimal age related atrophic change. No mass, mass effect, shift of midline structures or acute hemorrhage. No evidence of acute infarction. Vascular: No hyperdense vessel or unexpected calcification. Skull: Minimally displaced nasal bone fracture. Other: Moderate opacification with air-fluid levels over the sinuses likely hemorrhagic debris. CT MAXILLOFACIAL FINDINGS Osseous: Examination demonstrates a minimally displaced nasal bone fracture. No additional facial bone fractures are identified. Degenerate change of the temporomandibular joints bilaterally. Mild degenerate change of the spine. Orbits: Soft tissue swelling over the right periorbital region. The globes and retrobulbar spaces are normal symmetric. Sinuses: Paranasal sinuses are well developed as there is opacification with scattered air-fluid levels present likely combination of inflammatory change and hemorrhagic debris. Deviation of the nasal septum to the left. Soft tissues: Right periorbital soft tissue swelling. CT CERVICAL SPINE FINDINGS Alignment: Subtle 2 mm anterior subluxation of C3 on C4 likely degenerative. No traumatic subluxation. Skull base and vertebrae: Vertebral body heights are normal. There is mild to moderate spondylosis of the cervical spine. No evidence of acute fracture. Mild bilateral neural foraminal narrowing at several levels due to adjacent bony spurring. There is moderate uncovertebral joint spurring and facet arthropathy. Soft tissues and spinal canal: Prevertebral soft tissues are normal. Spinal canal is within normal. Disc levels: Disc space narrowing at the C4-5, C5-6 and C6-7 levels. Upper chest: Within  normal. Other: None. IMPRESSION: No acute brain injury. Mild chronic ischemic microvascular disease and age related atrophic change. Minimally displaced nasal bone fracture. Right periorbital soft tissue swelling. Combination of inflammatory change and hemorrhagic debris within the paranasal sinuses. No acute cervical spine injury. Moderate spondylosis of the cervical spine with multilevel disc disease. Electronically Signed   By: Marin Olp M.D.   On: 11/02/2017 12:18    Medications:   . metoprolol tartrate  25 mg Oral BID  . pantoprazole  40 mg Oral Daily  . pravastatin  40 mg Oral q morning - 10a  . sodium chloride flush  3 mL Intravenous Q12H  . venlafaxine XR  75 mg Oral Daily   Continuous Infusions: . sodium chloride 75 mL/hr at 11/02/17 1743     LOS: 1 day    Level III visit based on time. 40 minutes spent at the patient's bedside in a lengthy discussion with the patient's daughter regarding her overall prognosis, current treatment plan, and risk/benefit ratio of continuing anticoagulation in the setting of frequent falls in the home.  Margreta Journey Cameron Katayama  [facility] Pager [phone]. If unable to reach me by pager, please call my cell phone at [phone].  *Please refer to [email or web address], password [email or web address] to get updated schedule on who will round on this patient, as hospitalists switch teams weekly. If 7PM-7AM, please contact night-coverage at [email or web address], password [email or web address] for any overnight needs.  11/03/2017, 8:49 AM

## 2017-11-04 DIAGNOSIS — R040 Epistaxis: Secondary | ICD-10-CM

## 2017-11-04 DIAGNOSIS — R55 Syncope and collapse: Principal | ICD-10-CM

## 2017-11-04 LAB — BASIC METABOLIC PANEL
Anion gap: 11 (ref 5–15)
BUN: 9 mg/dL (ref 6–20)
CHLORIDE: 106 mmol/L (ref 101–111)
CO2: 22 mmol/L (ref 22–32)
Calcium: 8.5 mg/dL — ABNORMAL LOW (ref 8.9–10.3)
Creatinine, Ser: 0.82 mg/dL (ref 0.44–1.00)
GFR calc non Af Amer: >60 mL/min (ref >60)
Glucose, Bld: 152 mg/dL — ABNORMAL HIGH (ref 65–99)
POTASSIUM: 3.1 mmol/L — AB (ref 3.5–5.1)
SODIUM: 139 mmol/L (ref 135–145)

## 2017-11-04 LAB — CBC
HCT: 43 % (ref 36.0–46.0)
HEMOGLOBIN: 13.3 g/dL (ref 12.0–15.0)
MCH: 27.3 pg (ref 26.0–34.0)
MCHC: 30.9 g/dL (ref 30.0–36.0)
MCV: 88.1 fL (ref 78.0–100.0)
Platelets: 149 10*3/uL — ABNORMAL LOW (ref 150–400)
RBC: 4.88 MIL/uL (ref 3.87–5.11)
RDW: 15.7 % — ABNORMAL HIGH (ref 11.5–15.5)
WBC: 7.5 10*3/uL (ref 4.0–10.5)

## 2017-11-04 MED ORDER — CEPHALEXIN 500 MG PO CAPS: 500.0000 mg | ORAL_CAPSULE | Freq: Two times a day (BID) | ORAL | 0 refills | Status: AC

## 2017-11-04 MED ORDER — POTASSIUM CHLORIDE CRYS ER 20 MEQ PO TBCR
40.0000 meq | EXTENDED_RELEASE_TABLET | Freq: Every day | ORAL | Status: DC
  Administered 2017-11-04: 40 meq via ORAL
  Filled 2017-11-04: qty 2

## 2017-11-04 NOTE — Consult Note (Addendum)
   [facility] Inpatient Consult   11/04/2017  Raven Gray May 08, 1928 [id number]  Patient evaluated for community based chronic disease management services with [facility] as a benefit. Patient and daughter are declining home health care.  Spoke with patient and daughter [name] at the bedside to explain [facility] services. Patient is in the [place].  Daughter and patient endorses Dr. Deland Pretty as her primary care provider, this office is listed to do the transition of care call but will notify them of [facility] will do transition of care program. Patient admitted with Syncope and was on anticoags prior to admission for HX of Atrial Fibrillation. Patient with COPD with home oxygen at night, multiple falls.  Patient will receive post hospital discharge call and will be evaluated for monthly home visits for assessments and disease process education. Consent form signed and copy given. Patient will be at daughter's home and family will be providing 24 hour care.  Left contact information and [facility] literature at bedside. Made Inpatient Case Manager aware that [facility] following. Of note, [facility] Care Management services does not replace or interfere with any services that are arranged by inpatient case management or social work.  For additional questions or referrals please contact:    Natividad Brood, RN BSN [facility] Liaison  [phone] business mobile phone Toll free office [phone]   [phone]:  Addendum: Spoke with Danielle at Dr. Katrine Coho office that [employer] [facility] will be doing TOC.

## 2017-11-04 NOTE — Discharge Summary (Signed)
Physician Discharge Summary  Raven Gray YFV:[id number] DOB: [birth year] DOA: 11/02/2017  PCP: [name], MD  Admit date: 11/02/2017 Discharge date: 11/04/2017  Time spent: 45 minutes  Recommendations for Outpatient Follow-up:  1. Have discontinued at this time chronic Xarelto and should not be on any blood thinners going forward 2. Does need a check of potassium magnesium in about 1-2 weeks  Discharge Diagnoses:  Principal Problem:   Syncope Active Problems:   Atrial fibrillation (HCC)   Nasal fracture   Fall at home   Hypertension   COPD with emphysema ([place])   HLD (hyperlipidemia)   Epistaxis   Discharge Condition: gaurded  Diet recommendation: heart healthy linberalize as prn  Filed Weights   11/02/17 1642 11/03/17 0618 11/04/17 0441  Weight: 75.7 kg (166 lb 12.8 oz) 74.8 kg (164 lb 14.4 oz) 74.7 kg (164 lb 11.2 oz)    History of present illness:  84 year old female with tachybradycardia syndrome with pacemaker CVA hypertension hyperlipidemia atrial fibrillation previously on Xarelto had an unwitnessed fall resulting in facial fractures and patient was observed in the hospital-patient has been noncompliant with therapy in the past and long discussions were held with family Eventually cardiology was consulted to give an opinion regarding Xarelto use ongoing in a patient with frequent 2-3 falls a week It was noted that she had a nasal fracture as well and that she had a nasal rocket placed-the bleeding stopped and she will have this removed Overall she stabilized but family is interested in seeing what kind of assistance she would get at home and was not interested in placement at a skilled facility despite being recommended the same multiple times as she was noncompliant with same I feel that is reasonable She was discharged back to her home for 24 7 care and I have recommended that potentially trigger of hospice if she continues to decline   Echocardiogram was  counseled  Consultations:  Cardiology  Discharge Exam: Vitals:   11/03/17 2148 11/04/17 0441  BP: (!) 175/88 (!) 157/87  Pulse: 98 67  Resp: 18 18  Temp: 97.7 F (36.5 C) 98.2 F (36.8 C)  SpO2: 100% 98%    General: She is awake alert pleasant and is somewhat oriented without any distress she has battles fracture as well as ecchymosis around the periorbital area and nasal rocket in the left nare She has some mild pallor S1-S2 no murmur rub or gallop Cardiovascular: Telemetry shows paced rhythm no further interpretation Respiratory: Chest is clinically clear without added sound and no rales no rhonchi  Discharge Instructions   Discharge Instructions    Diet - low sodium heart healthy   Complete by:  As directed    Discharge instructions   Complete by:  As directed    Please do not take Xarelto or any other medications that thin the blood until you discuss it with the cardiologist as an outpatient We have asked for home health to come out and offer some assistance and aid not therapy I would recommend getting her potassium checked within the next 2-3 weeks She may benefit from being seen by hospice agency and would probably also benefit from end-of-life discussions if she continues to have falls that debilitate her   Increase activity slowly   Complete by:  As directed      Allergies as of 11/04/2017   No Known Allergies     Medication List    STOP taking these medications   Rivaroxaban 15 MG Tabs tablet Commonly  known as:  XARELTO     TAKE these medications   acetaminophen 325 MG tablet Commonly known as:  TYLENOL Take 650 mg by mouth every 6 (six) hours as needed (for pain).   ALPRAZolam 0.25 MG tablet Commonly known as:  XANAX Take 1 tablet (0.25 mg total) by mouth 2 (two) times daily as needed for anxiety.   furosemide 40 MG tablet Commonly known as:  LASIX Take 1 tablet (40 mg total) by mouth every morning.   metoprolol tartrate 25 MG tablet Commonly  known as:  LOPRESSOR Take 1 tablet (25 mg total) by mouth 2 (two) times daily.   nitroGLYCERIN 0.4 MG SL tablet Commonly known as:  NITROSTAT Place 0.4 mg under the tongue as needed for chest pain. X 3 doses   omeprazole 20 MG capsule Commonly known as:  PRILOSEC Take 20 mg by mouth daily as needed (for reflux/heartburn).   OXYGEN Inhale 2 L into the lungs continuous.   potassium chloride SA 20 MEQ tablet Commonly known as:  K-DUR,KLOR-CON Take 40 mEq by mouth daily. Take 2 tablets every morning   pravastatin 40 MG tablet Commonly known as:  PRAVACHOL Take 40 mg by mouth every morning.   venlafaxine XR 150 MG 24 hr capsule Commonly known as:  EFFEXOR-XR Take 150 mg by mouth daily.   venlafaxine XR 75 MG 24 hr capsule Commonly known as:  EFFEXOR-XR Take 1 capsule by mouth daily.   WOMENS 50+ MULTI VITAMIN/MIN Tabs Take 1 tablet by mouth daily.   zolpidem 5 MG tablet Commonly known as:  AMBIEN Take 5 mg by mouth at bedtime.      No Known Allergies    The results of significant diagnostics from this hospitalization (including imaging, microbiology, ancillary and laboratory) are listed below for reference.    Significant Diagnostic Studies: Ct Head Wo Contrast  Result Date: 11/02/2017 CLINICAL DATA:  Unwitnessed fall to floor with injury to nose and left orbit. EXAM: CT HEAD WITHOUT CONTRAST CT MAXILLOFACIAL WITHOUT CONTRAST CT CERVICAL SPINE WITHOUT CONTRAST TECHNIQUE: Multidetector CT imaging of the head, cervical spine, and maxillofacial structures were performed using the standard protocol without intravenous contrast. Multiplanar CT image reconstructions of the cervical spine and maxillofacial structures were also generated. COMPARISON:  Head CT 01/08/2017 FINDINGS: CT HEAD FINDINGS Brain: Ventricles, cisterns and other CSF spaces are within normal. There is mild chronic ischemic microvascular disease and minimal age related atrophic change. No mass, mass effect,  shift of midline structures or acute hemorrhage. No evidence of acute infarction. Vascular: No hyperdense vessel or unexpected calcification. Skull: Minimally displaced nasal bone fracture. Other: Moderate opacification with air-fluid levels over the sinuses likely hemorrhagic debris. CT MAXILLOFACIAL FINDINGS Osseous: Examination demonstrates a minimally displaced nasal bone fracture. No additional facial bone fractures are identified. Degenerate change of the temporomandibular joints bilaterally. Mild degenerate change of the spine. Orbits: Soft tissue swelling over the right periorbital region. The globes and retrobulbar spaces are normal symmetric. Sinuses: Paranasal sinuses are well developed as there is opacification with scattered air-fluid levels present likely combination of inflammatory change and hemorrhagic debris. Deviation of the nasal septum to the left. Soft tissues: Right periorbital soft tissue swelling. CT CERVICAL SPINE FINDINGS Alignment: Subtle 2 mm anterior subluxation of C3 on C4 likely degenerative. No traumatic subluxation. Skull base and vertebrae: Vertebral body heights are normal. There is mild to moderate spondylosis of the cervical spine. No evidence of acute fracture. Mild bilateral neural foraminal narrowing at several levels due to adjacent  bony spurring. There is moderate uncovertebral joint spurring and facet arthropathy. Soft tissues and spinal canal: Prevertebral soft tissues are normal. Spinal canal is within normal. Disc levels: Disc space narrowing at the C4-5, C5-6 and C6-7 levels. Upper chest: Within normal. Other: None. IMPRESSION: No acute brain injury. Mild chronic ischemic microvascular disease and age related atrophic change. Minimally displaced nasal bone fracture. Right periorbital soft tissue swelling. Combination of inflammatory change and hemorrhagic debris within the paranasal sinuses. No acute cervical spine injury. Moderate spondylosis of the cervical spine  with multilevel disc disease. Electronically Signed   By: Marin Olp M.D.   On: 11/02/2017 12:18   Ct Cervical Spine Wo Contrast  Result Date: 11/02/2017 CLINICAL DATA:  Unwitnessed fall to floor with injury to nose and left orbit. EXAM: CT HEAD WITHOUT CONTRAST CT MAXILLOFACIAL WITHOUT CONTRAST CT CERVICAL SPINE WITHOUT CONTRAST TECHNIQUE: Multidetector CT imaging of the head, cervical spine, and maxillofacial structures were performed using the standard protocol without intravenous contrast. Multiplanar CT image reconstructions of the cervical spine and maxillofacial structures were also generated. COMPARISON:  Head CT 01/08/2017 FINDINGS: CT HEAD FINDINGS Brain: Ventricles, cisterns and other CSF spaces are within normal. There is mild chronic ischemic microvascular disease and minimal age related atrophic change. No mass, mass effect, shift of midline structures or acute hemorrhage. No evidence of acute infarction. Vascular: No hyperdense vessel or unexpected calcification. Skull: Minimally displaced nasal bone fracture. Other: Moderate opacification with air-fluid levels over the sinuses likely hemorrhagic debris. CT MAXILLOFACIAL FINDINGS Osseous: Examination demonstrates a minimally displaced nasal bone fracture. No additional facial bone fractures are identified. Degenerate change of the temporomandibular joints bilaterally. Mild degenerate change of the spine. Orbits: Soft tissue swelling over the right periorbital region. The globes and retrobulbar spaces are normal symmetric. Sinuses: Paranasal sinuses are well developed as there is opacification with scattered air-fluid levels present likely combination of inflammatory change and hemorrhagic debris. Deviation of the nasal septum to the left. Soft tissues: Right periorbital soft tissue swelling. CT CERVICAL SPINE FINDINGS Alignment: Subtle 2 mm anterior subluxation of C3 on C4 likely degenerative. No traumatic subluxation. Skull base and vertebrae:  Vertebral body heights are normal. There is mild to moderate spondylosis of the cervical spine. No evidence of acute fracture. Mild bilateral neural foraminal narrowing at several levels due to adjacent bony spurring. There is moderate uncovertebral joint spurring and facet arthropathy. Soft tissues and spinal canal: Prevertebral soft tissues are normal. Spinal canal is within normal. Disc levels: Disc space narrowing at the C4-5, C5-6 and C6-7 levels. Upper chest: Within normal. Other: None. IMPRESSION: No acute brain injury. Mild chronic ischemic microvascular disease and age related atrophic change. Minimally displaced nasal bone fracture. Right periorbital soft tissue swelling. Combination of inflammatory change and hemorrhagic debris within the paranasal sinuses. No acute cervical spine injury. Moderate spondylosis of the cervical spine with multilevel disc disease. Electronically Signed   By: Marin Olp M.D.   On: 11/02/2017 12:18   Ct Lumbar Spine Wo Contrast  Result Date: 10/07/2017 CLINICAL DATA:  84 year old female with acute left side lumbar back pain and sciatica. Frequent falls. EXAM: CT LUMBAR SPINE WITHOUT CONTRAST TECHNIQUE: Multidetector CT imaging of the lumbar spine was performed without intravenous contrast administration. Multiplanar CT image reconstructions were also generated. COMPARISON:  Outside lumbar radiographs 0930 hr today. CT lumbar myelogram 02/28/2015. FINDINGS: Segmentation: Normal as demonstrated on the 2016 comparison. Alignment: Moderate to severe dextroconvex lumbar scoliosis is chronic but progressed since 2,016. Chronic grade 1 anterolisthesis  of L5 on S1 is stable. Mild retrolisthesis of L2 on L3 is stable. Mild retrolisthesis of L1 on L2 is stable. Vertebrae: Mild deformity of the T12 inferior endplate is unchanged since 2,016. Similar chronic L2 superior endplate deformity is also stable. A fracture through the right L5 transverse process demonstrated in 2016 has  healed since that time. The other lumbar levels appear stable and intact. Visible sacrum and SI joints appear intact. The T11 vertebral body is intact with a benign hemangioma, however, there is a subacute appearing, partially healed fracture of the right T11 rib at the costovertebral junction (series 4, image 3). Other visible lower ribs appear intact. Paraspinal and other soft tissues: Partially visible hiatal hernia. Calcified aortic atherosclerosis. Mild ectasia of the abdominal aorta is stable since 2,016. Right nephrolithiasis is partially visible with stones up to 8 millimeters. Chronic right upper pole renal cyst incidentally noted. Posterior paraspinal soft tissues are stable with mild postoperative changes at the L5 level. Disc levels: T11-T12: Mild circumferential disc bulge and endplate spurring. T12-L1: Mild chronic vacuum disc, circumferential disc bulge and endplate spurring. L1-L2: Mild chronic vacuum disc. Circumferential disc bulge and endplate spurring. L2-L3: Chronic severe disc space loss and vacuum disc. Chronic retrolisthesis. Chronic circumferential disc osteophyte complex and moderate facet hypertrophy. Severe left L2 foraminal stenosis. Chronic left lateral recess stenosis but no significant spinal stenosis. This level is stable. L3-L4: Chronic severe disc space loss and vacuum disc with circumferential disc osteophyte complex. Moderate to severe facet hypertrophy. Mild spinal and moderate to severe left L3 foraminal stenosis appears not significantly changed. L4-L5: Chronic posterior interspinous spacer device at this level. Device positioning is stable. There is chronic right posterior element arthrodesis (coronal image 43). Mild circumferential disc bulge and endplate spurring with severe posterior element hypertrophy. Mass effect on the thecal sac appears progressed since 2016 with moderate to severe spinal stenosis suspected (series 5, image 65). Moderate right L4 foraminal stenosis  appears stable. L5-S1: Chronic grade 1 anterolisthesis with disc space loss and vacuum disc. Circumferential disc bulge. Severe bilateral facet hypertrophy greater on the right. Chronic vacuum facet. Mild spinal stenosis appears stable. Mild left and moderate to severe right L5 foraminal stenosis appears stable. IMPRESSION: 1. Subacute appearing fracture of the head of the right 11th rib at the costovertebral junction. No other acute osseous abnormality identified. 2. Chronic severe lumbar spine degeneration with progressed dextroconvex scoliosis since 2016. 3. Progressed multifactorial spinal stenosis at L4-L5 since the 2016 myelogram, now moderate to severe, but underlying arthrodesis of the right posterior elements at that level as well as a stable chronic interspinous spacer device. 4. Other chronic lumbar spinal and/or neural foraminal stenosis appears stable since 2016. 5. Nephrolithiasis. Stable ectasia of the abdominal aorta with Aortic Atherosclerosis (ICD10-I70.0). Electronically Signed   By: Genevie Ann M.D.   On: 10/07/2017 12:04   Dg Chest Portable 1 View  Result Date: 11/02/2017 CLINICAL DATA:  Trauma secondary to a fall this morning. Facial lacerations. EXAM: PORTABLE CHEST 1 VIEW COMPARISON:  Chest x-ray dated 08/18/2017 and 07/02/2017 FINDINGS: Chronic cardiomegaly.  Large hiatal hernia.  Aortic atherosclerosis. Chronic slight accentuation of the interstitial markings bilaterally. No acute infiltrates or effusions. Pulmonary vascularity is normal. Pacemaker in place.  No acute bone abnormality. IMPRESSION: No acute abnormality. Chronic issue a shin of the interstitial markings. Huge hiatal hernia. Aortic Atherosclerosis (ICD10-I70.0). Electronically Signed   By: Lorriane Shire M.D.   On: 11/02/2017 11:39   Ct Maxillofacial Wo Contrast  Result Date:  11/02/2017 CLINICAL DATA:  Unwitnessed fall to floor with injury to nose and left orbit. EXAM: CT HEAD WITHOUT CONTRAST CT MAXILLOFACIAL WITHOUT  CONTRAST CT CERVICAL SPINE WITHOUT CONTRAST TECHNIQUE: Multidetector CT imaging of the head, cervical spine, and maxillofacial structures were performed using the standard protocol without intravenous contrast. Multiplanar CT image reconstructions of the cervical spine and maxillofacial structures were also generated. COMPARISON:  Head CT 01/08/2017 FINDINGS: CT HEAD FINDINGS Brain: Ventricles, cisterns and other CSF spaces are within normal. There is mild chronic ischemic microvascular disease and minimal age related atrophic change. No mass, mass effect, shift of midline structures or acute hemorrhage. No evidence of acute infarction. Vascular: No hyperdense vessel or unexpected calcification. Skull: Minimally displaced nasal bone fracture. Other: Moderate opacification with air-fluid levels over the sinuses likely hemorrhagic debris. CT MAXILLOFACIAL FINDINGS Osseous: Examination demonstrates a minimally displaced nasal bone fracture. No additional facial bone fractures are identified. Degenerate change of the temporomandibular joints bilaterally. Mild degenerate change of the spine. Orbits: Soft tissue swelling over the right periorbital region. The globes and retrobulbar spaces are normal symmetric. Sinuses: Paranasal sinuses are well developed as there is opacification with scattered air-fluid levels present likely combination of inflammatory change and hemorrhagic debris. Deviation of the nasal septum to the left. Soft tissues: Right periorbital soft tissue swelling. CT CERVICAL SPINE FINDINGS Alignment: Subtle 2 mm anterior subluxation of C3 on C4 likely degenerative. No traumatic subluxation. Skull base and vertebrae: Vertebral body heights are normal. There is mild to moderate spondylosis of the cervical spine. No evidence of acute fracture. Mild bilateral neural foraminal narrowing at several levels due to adjacent bony spurring. There is moderate uncovertebral joint spurring and facet arthropathy. Soft  tissues and spinal canal: Prevertebral soft tissues are normal. Spinal canal is within normal. Disc levels: Disc space narrowing at the C4-5, C5-6 and C6-7 levels. Upper chest: Within normal. Other: None. IMPRESSION: No acute brain injury. Mild chronic ischemic microvascular disease and age related atrophic change. Minimally displaced nasal bone fracture. Right periorbital soft tissue swelling. Combination of inflammatory change and hemorrhagic debris within the paranasal sinuses. No acute cervical spine injury. Moderate spondylosis of the cervical spine with multilevel disc disease. Electronically Signed   By: Marin Olp M.D.   On: 11/02/2017 12:18    Microbiology: No results found for this or any previous visit (from the past 240 hour(s)).   Labs: Basic Metabolic Panel: Recent Labs  Lab 11/02/17 1127 11/02/17 1636 11/03/17 0442 11/04/17 0447  NA 140  --  141 139  K 4.2  --  3.9 3.1*  CL 104  --  102 106  CO2 25  --  26 22  GLUCOSE 117*  --  128* 152*  BUN 17  --  15 9  CREATININE 0.88  --  0.87 0.82  CALCIUM 9.0  --  9.0 8.5*  MG  --  2.0  --   --    Liver Function Tests: Recent Labs  Lab 11/02/17 1127  AST 23  ALT 15  ALKPHOS 100  BILITOT 0.9  PROT 6.9  ALBUMIN 3.7   No results for input(s): LIPASE, AMYLASE in the last 168 hours. No results for input(s): AMMONIA in the last 168 hours. CBC: Recent Labs  Lab 11/02/17 1127 11/03/17 0442 11/04/17 0447  WBC 8.8 8.6 7.5  NEUTROABS 5.8  --   --   HGB 14.8 13.7 13.3  HCT 48.2* 44.2 43.0  MCV 88.9 88.0 88.1  PLT 165 164 149*  Cardiac Enzymes: Recent Labs  Lab 11/02/17 1127  TROPONINI <0.03   BNP: BNP (last 3 results) Recent Labs    07/01/17 0825  BNP 555.2*    ProBNP (last 3 results) No results for input(s): PROBNP in the last 8760 hours.  CBG: Recent Labs  Lab 11/03/17 0653  GLUCAP 103*       Signed:  Nita Sells MD   [facility] 11/04/2017, 11:00 AM

## 2017-11-04 NOTE — Progress Notes (Signed)
Patient has home oxygen through [place]; Nasal fracture need mask for oxygen, Dan with [facility] called, will bring mask to the patient's room today prior to dc home today; Pt and daughter is refusing all [facility] services at this time, they will provide 24 hr care in the home. Mindi Slicker [facility] [phone]

## 2017-11-05 ENCOUNTER — Other Ambulatory Visit: Payer: Self-pay | Admitting: *Deleted

## 2017-11-05 ENCOUNTER — Encounter: Payer: Self-pay | Admitting: *Deleted

## 2017-11-05 NOTE — Patient Outreach (Signed)
11/05/2017   Raven Gray  04/06/1928 [id number]   Transition of care call:   Successful telephone encounter to daughter Raven Gray (on [facility] consent) for Raven Gray, 84 year old  Female as this RN CM covering for coworker Reginia Naas RN CM(pt's primary RN CM), follow up on 11/04/17 Referral received from [place] hospital liaison to follow pt with Community CM services/transition of  Care/recent hospitalization February 11-13,2019 for Syncope, Atrial fib, fall at home (nasal fracture).  Pt's  History includes but not limited to Hypertension, COPD with emphysema, HLD, GERD, acute diastolic  Heart failure.   Spoke with daughter Raven Gray, HIPAA identifiers verified on pt, discussed purpose of call- follow Up on pt's recent hospital discharge.   Daughter reports is unable to do nothing  By herself since discharge  home, staying with pt in her home providing 24 hour care, family rotating turns.   Daughter reports   pt is weak,  unable to walk,transfers her to wheelchair by lifting.   Daughter reports fall is what brought pt into the  Hospital, pt turned around in the kitchen/lost balance, face caught everything, nose broken.   Medications:  Daughter reports pt taking all of her medications per discharge papers, she is administering to pt.         *Patient was recently discharged from hospital and all medications have been reviewed.  Pain:  Daughter reports pt's legs hurt from the fall, gave her Tylenol which helped, talked to pharmacist     About something else pt can use for pain, was told can start Ibuprofen  as Xarelto was discontinued during pt's      hospitalization.   MD follow up visits:  Per daughter, cancelled pt's follow up appointment with PCP tomorrow as pt has to see     ENT to have nose packing removed, too difficult at this time to get pt to two MD appointments.      Daughter reports pt saw PCP last week, going to call to see if pt really needs to be seen.   RN CM discussed with  daughter covering for coworker Raven Gray (pt's primary RN CM),Raven Gray to follow up next     Week telephonically as part of ongoing transition of care.  RN CM provided daughter with contact information     As well as [facility] main office number to call if needs rise before return of primary RN CM.    Daughter did    Confirm has 24 hour nurse line number.    Plan:  Barrier letter sent to Dr. Maudie Mercury informing of [facility] involvement.           Plan to provide update to pt's primary RN CM upon her return of today's successful telephone outreach              To pt's daughter.    Raven Gray.   [place] [facility]  [phone]

## 2017-11-06 ENCOUNTER — Emergency Department (HOSPITAL_BASED_OUTPATIENT_CLINIC_OR_DEPARTMENT_OTHER)
Admit: 2017-11-06 | Discharge: 2017-11-06 | Disposition: A | Discharge status: Home / Self Care | Payer: Medicare Other | Attending: Emergency Medicine | Admitting: Emergency Medicine

## 2017-11-06 ENCOUNTER — Emergency Department (HOSPITAL_COMMUNITY): Payer: Medicare Other

## 2017-11-06 ENCOUNTER — Other Ambulatory Visit: Payer: Self-pay

## 2017-11-06 ENCOUNTER — Observation Stay (HOSPITAL_COMMUNITY): Payer: Medicare Other

## 2017-11-06 ENCOUNTER — Encounter (HOSPITAL_COMMUNITY): Payer: Self-pay | Admitting: Emergency Medicine

## 2017-11-06 ENCOUNTER — Observation Stay (HOSPITAL_COMMUNITY)
Admission: EM | Admit: 2017-11-06 | Discharge: 2017-11-09 | Disposition: A | Discharge status: Skilled Nursing Facility | Payer: Medicare Other | Attending: Internal Medicine | Admitting: Internal Medicine

## 2017-11-06 DIAGNOSIS — J449 Chronic obstructive pulmonary disease, unspecified: Secondary | ICD-10-CM | POA: Diagnosis present

## 2017-11-06 DIAGNOSIS — I5031 Acute diastolic (congestive) heart failure: Secondary | ICD-10-CM | POA: Diagnosis not present

## 2017-11-06 DIAGNOSIS — Z950 Presence of cardiac pacemaker: Secondary | ICD-10-CM | POA: Diagnosis not present

## 2017-11-06 DIAGNOSIS — M47812 Spondylosis without myelopathy or radiculopathy, cervical region: Secondary | ICD-10-CM | POA: Insufficient documentation

## 2017-11-06 DIAGNOSIS — R0609 Other forms of dyspnea: Secondary | ICD-10-CM | POA: Diagnosis present

## 2017-11-06 DIAGNOSIS — E785 Hyperlipidemia, unspecified: Secondary | ICD-10-CM | POA: Insufficient documentation

## 2017-11-06 DIAGNOSIS — G9340 Encephalopathy, unspecified: Secondary | ICD-10-CM | POA: Diagnosis not present

## 2017-11-06 DIAGNOSIS — M419 Scoliosis, unspecified: Secondary | ICD-10-CM | POA: Insufficient documentation

## 2017-11-06 DIAGNOSIS — S8992XA Unspecified injury of left lower leg, initial encounter: Secondary | ICD-10-CM | POA: Diagnosis not present

## 2017-11-06 DIAGNOSIS — S79911A Unspecified injury of right hip, initial encounter: Secondary | ICD-10-CM | POA: Diagnosis not present

## 2017-11-06 DIAGNOSIS — M25562 Pain in left knee: Secondary | ICD-10-CM | POA: Diagnosis not present

## 2017-11-06 DIAGNOSIS — I495 Sick sinus syndrome: Secondary | ICD-10-CM | POA: Insufficient documentation

## 2017-11-06 DIAGNOSIS — R040 Epistaxis: Secondary | ICD-10-CM | POA: Diagnosis not present

## 2017-11-06 DIAGNOSIS — S022XXD Fracture of nasal bones, subsequent encounter for fracture with routine healing: Secondary | ICD-10-CM | POA: Diagnosis not present

## 2017-11-06 DIAGNOSIS — W19XXXD Unspecified fall, subsequent encounter: Secondary | ICD-10-CM | POA: Diagnosis not present

## 2017-11-06 DIAGNOSIS — J9611 Chronic respiratory failure with hypoxia: Secondary | ICD-10-CM | POA: Diagnosis present

## 2017-11-06 DIAGNOSIS — S0990XA Unspecified injury of head, initial encounter: Secondary | ICD-10-CM | POA: Diagnosis not present

## 2017-11-06 DIAGNOSIS — J342 Deviated nasal septum: Secondary | ICD-10-CM | POA: Insufficient documentation

## 2017-11-06 DIAGNOSIS — M5136 Other intervertebral disc degeneration, lumbar region: Secondary | ICD-10-CM | POA: Insufficient documentation

## 2017-11-06 DIAGNOSIS — S022XXA Fracture of nasal bones, initial encounter for closed fracture: Secondary | ICD-10-CM | POA: Diagnosis present

## 2017-11-06 DIAGNOSIS — I10 Essential (primary) hypertension: Secondary | ICD-10-CM | POA: Diagnosis present

## 2017-11-06 DIAGNOSIS — Z8673 Personal history of transient ischemic attack (TIA), and cerebral infarction without residual deficits: Secondary | ICD-10-CM | POA: Diagnosis not present

## 2017-11-06 DIAGNOSIS — R0602 Shortness of breath: Secondary | ICD-10-CM | POA: Diagnosis not present

## 2017-11-06 DIAGNOSIS — M19011 Primary osteoarthritis, right shoulder: Secondary | ICD-10-CM | POA: Diagnosis not present

## 2017-11-06 DIAGNOSIS — I110 Hypertensive heart disease with heart failure: Secondary | ICD-10-CM | POA: Insufficient documentation

## 2017-11-06 DIAGNOSIS — M5137 Other intervertebral disc degeneration, lumbosacral region: Secondary | ICD-10-CM | POA: Insufficient documentation

## 2017-11-06 DIAGNOSIS — I472 Ventricular tachycardia: Secondary | ICD-10-CM | POA: Insufficient documentation

## 2017-11-06 DIAGNOSIS — M549 Dorsalgia, unspecified: Secondary | ICD-10-CM | POA: Diagnosis not present

## 2017-11-06 DIAGNOSIS — R404 Transient alteration of awareness: Secondary | ICD-10-CM | POA: Diagnosis not present

## 2017-11-06 DIAGNOSIS — Z86711 Personal history of pulmonary embolism: Secondary | ICD-10-CM | POA: Insufficient documentation

## 2017-11-06 DIAGNOSIS — Z7901 Long term (current) use of anticoagulants: Secondary | ICD-10-CM | POA: Insufficient documentation

## 2017-11-06 DIAGNOSIS — S0083XD Contusion of other part of head, subsequent encounter: Secondary | ICD-10-CM | POA: Diagnosis not present

## 2017-11-06 DIAGNOSIS — E860 Dehydration: Secondary | ICD-10-CM | POA: Diagnosis not present

## 2017-11-06 DIAGNOSIS — S0181XA Laceration without foreign body of other part of head, initial encounter: Secondary | ICD-10-CM | POA: Insufficient documentation

## 2017-11-06 DIAGNOSIS — S79912A Unspecified injury of left hip, initial encounter: Secondary | ICD-10-CM | POA: Diagnosis not present

## 2017-11-06 DIAGNOSIS — Z79899 Other long term (current) drug therapy: Secondary | ICD-10-CM | POA: Insufficient documentation

## 2017-11-06 DIAGNOSIS — M25552 Pain in left hip: Secondary | ICD-10-CM | POA: Diagnosis not present

## 2017-11-06 DIAGNOSIS — I700 Atherosclerosis of aorta: Secondary | ICD-10-CM | POA: Insufficient documentation

## 2017-11-06 DIAGNOSIS — I4821 Permanent atrial fibrillation: Secondary | ICD-10-CM | POA: Diagnosis present

## 2017-11-06 DIAGNOSIS — R296 Repeated falls: Secondary | ICD-10-CM | POA: Insufficient documentation

## 2017-11-06 DIAGNOSIS — Z96659 Presence of unspecified artificial knee joint: Secondary | ICD-10-CM | POA: Insufficient documentation

## 2017-11-06 DIAGNOSIS — M7989 Other specified soft tissue disorders: Secondary | ICD-10-CM | POA: Diagnosis not present

## 2017-11-06 DIAGNOSIS — I083 Combined rheumatic disorders of mitral, aortic and tricuspid valves: Secondary | ICD-10-CM | POA: Insufficient documentation

## 2017-11-06 DIAGNOSIS — Z86718 Personal history of other venous thrombosis and embolism: Secondary | ICD-10-CM | POA: Diagnosis not present

## 2017-11-06 DIAGNOSIS — M19012 Primary osteoarthritis, left shoulder: Secondary | ICD-10-CM | POA: Insufficient documentation

## 2017-11-06 DIAGNOSIS — R55 Syncope and collapse: Secondary | ICD-10-CM | POA: Diagnosis not present

## 2017-11-06 DIAGNOSIS — M1712 Unilateral primary osteoarthritis, left knee: Secondary | ICD-10-CM

## 2017-11-06 DIAGNOSIS — I482 Chronic atrial fibrillation: Secondary | ICD-10-CM | POA: Diagnosis not present

## 2017-11-06 DIAGNOSIS — M25551 Pain in right hip: Secondary | ICD-10-CM | POA: Diagnosis not present

## 2017-11-06 DIAGNOSIS — I351 Nonrheumatic aortic (valve) insufficiency: Secondary | ICD-10-CM | POA: Diagnosis present

## 2017-11-06 DIAGNOSIS — K219 Gastro-esophageal reflux disease without esophagitis: Secondary | ICD-10-CM | POA: Diagnosis not present

## 2017-11-06 DIAGNOSIS — E876 Hypokalemia: Secondary | ICD-10-CM | POA: Insufficient documentation

## 2017-11-06 DIAGNOSIS — I4510 Unspecified right bundle-branch block: Secondary | ICD-10-CM | POA: Diagnosis not present

## 2017-11-06 DIAGNOSIS — F329 Major depressive disorder, single episode, unspecified: Secondary | ICD-10-CM | POA: Insufficient documentation

## 2017-11-06 DIAGNOSIS — K449 Diaphragmatic hernia without obstruction or gangrene: Secondary | ICD-10-CM | POA: Insufficient documentation

## 2017-11-06 DIAGNOSIS — W19XXXA Unspecified fall, initial encounter: Secondary | ICD-10-CM | POA: Diagnosis not present

## 2017-11-06 DIAGNOSIS — R609 Edema, unspecified: Secondary | ICD-10-CM

## 2017-11-06 DIAGNOSIS — N200 Calculus of kidney: Secondary | ICD-10-CM | POA: Insufficient documentation

## 2017-11-06 DIAGNOSIS — Z515 Encounter for palliative care: Secondary | ICD-10-CM

## 2017-11-06 LAB — CBC WITH DIFFERENTIAL/PLATELET
Basophils Absolute: 0 10*3/uL (ref 0.0–0.1)
Basophils Relative: 0 %
EOS ABS: 0.1 10*3/uL (ref 0.0–0.7)
Eosinophils Relative: 1 %
HCT: 42.8 % (ref 36.0–46.0)
Hemoglobin: 13.4 g/dL (ref 12.0–15.0)
LYMPHS ABS: 1.9 10*3/uL (ref 0.7–4.0)
Lymphocytes Relative: 18 %
MCH: 27.6 pg (ref 26.0–34.0)
MCHC: 31.3 g/dL (ref 30.0–36.0)
MCV: 88.2 fL (ref 78.0–100.0)
MONO ABS: 1.1 10*3/uL — AB (ref 0.1–1.0)
Monocytes Relative: 10 %
NEUTROS PCT: 71 %
Neutro Abs: 7.5 10*3/uL (ref 1.7–7.7)
PLATELETS: 156 10*3/uL (ref 150–400)
RBC: 4.85 MIL/uL (ref 3.87–5.11)
RDW: 16.3 % — AB (ref 11.5–15.5)
WBC: 10.6 10*3/uL — AB (ref 4.0–10.5)

## 2017-11-06 LAB — COMPREHENSIVE METABOLIC PANEL
ALT: 8 U/L — ABNORMAL LOW (ref 14–54)
ANION GAP: 14 (ref 5–15)
AST: 20 U/L (ref 15–41)
Albumin: 2.9 g/dL — ABNORMAL LOW (ref 3.5–5.0)
Alkaline Phosphatase: 75 U/L (ref 38–126)
BILIRUBIN TOTAL: 1.3 mg/dL — AB (ref 0.3–1.2)
BUN: 14 mg/dL (ref 6–20)
CO2: 23 mmol/L (ref 22–32)
Calcium: 8.8 mg/dL — ABNORMAL LOW (ref 8.9–10.3)
Chloride: 103 mmol/L (ref 101–111)
Creatinine, Ser: 1 mg/dL (ref 0.44–1.00)
GFR, EST AFRICAN AMERICAN: 57 mL/min — AB (ref >60)
GFR, EST NON AFRICAN AMERICAN: 49 mL/min — AB (ref >60)
Glucose, Bld: 121 mg/dL — ABNORMAL HIGH (ref 65–99)
POTASSIUM: 3.6 mmol/L (ref 3.5–5.1)
Sodium: 140 mmol/L (ref 135–145)
TOTAL PROTEIN: 6.2 g/dL — AB (ref 6.5–8.1)

## 2017-11-06 LAB — I-STAT TROPONIN, ED: TROPONIN I, POC: 0.01 ng/mL (ref 0.00–0.08)

## 2017-11-06 LAB — CK: Total CK: 113 U/L (ref 38–234)

## 2017-11-06 LAB — MAGNESIUM: Magnesium: 1.8 mg/dL (ref 1.7–2.4)

## 2017-11-06 LAB — BRAIN NATRIURETIC PEPTIDE: B NATRIURETIC PEPTIDE 5: 450.1 pg/mL — AB (ref 0.0–100.0)

## 2017-11-06 MED ORDER — SENNOSIDES-DOCUSATE SODIUM 8.6-50 MG PO TABS: 1.0000 | ORAL_TABLET | Freq: Every evening | ORAL | Status: DC | PRN

## 2017-11-06 MED ORDER — ONDANSETRON HCL 4 MG PO TABS
4.0000 mg | ORAL_TABLET | Freq: Four times a day (QID) | ORAL | Status: DC | PRN
  Filled 2017-11-06: qty 1

## 2017-11-06 MED ORDER — ACETAMINOPHEN 325 MG PO TABS
650.0000 mg | ORAL_TABLET | Freq: Four times a day (QID) | ORAL | Status: DC | PRN
  Administered 2017-11-08 – 2017-11-09 (×2): 650 mg via ORAL
  Filled 2017-11-06 (×2): qty 2

## 2017-11-06 MED ORDER — WOMENS 50+ MULTI VITAMIN/MIN PO TABS: 1.0000 | ORAL_TABLET | Freq: Every day | ORAL | Status: DC

## 2017-11-06 MED ORDER — VENLAFAXINE HCL ER 75 MG PO CP24: 75.0000 mg | ORAL_CAPSULE | Freq: Every day | ORAL | Status: DC

## 2017-11-06 MED ORDER — TRAZODONE HCL 50 MG PO TABS
25.0000 mg | ORAL_TABLET | Freq: Every evening | ORAL | Status: DC | PRN
  Administered 2017-11-06 – 2017-11-08 (×3): 25 mg via ORAL
  Filled 2017-11-06 (×3): qty 1

## 2017-11-06 MED ORDER — ACETAMINOPHEN 325 MG PO TABS: 650.0000 mg | ORAL_TABLET | Freq: Four times a day (QID) | ORAL | Status: DC | PRN

## 2017-11-06 MED ORDER — SALINE SPRAY 0.65 % NA SOLN
2.0000 | NASAL | Status: DC | PRN
  Filled 2017-11-06: qty 44

## 2017-11-06 MED ORDER — IOPAMIDOL (ISOVUE-370) INJECTION 76%
INTRAVENOUS | Status: AC
  Administered 2017-11-06: 80 mL
  Filled 2017-11-06: qty 100

## 2017-11-06 MED ORDER — VENLAFAXINE HCL ER 75 MG PO CP24
225.0000 mg | ORAL_CAPSULE | Freq: Every day | ORAL | Status: DC
  Filled 2017-11-06 (×2): qty 1

## 2017-11-06 MED ORDER — CEPHALEXIN 500 MG PO CAPS
500.0000 mg | ORAL_CAPSULE | Freq: Two times a day (BID) | ORAL | Status: DC
  Administered 2017-11-06 – 2017-11-09 (×6): 500 mg via ORAL
  Filled 2017-11-06 (×6): qty 1

## 2017-11-06 MED ORDER — ONDANSETRON HCL 4 MG/2ML IJ SOLN
4.0000 mg | Freq: Four times a day (QID) | INTRAMUSCULAR | Status: DC | PRN
  Filled 2017-11-06: qty 2

## 2017-11-06 MED ORDER — ACETAMINOPHEN 650 MG RE SUPP: 650.0000 mg | Freq: Four times a day (QID) | RECTAL | Status: DC | PRN

## 2017-11-06 MED ORDER — PANTOPRAZOLE SODIUM 40 MG PO TBEC
40.0000 mg | DELAYED_RELEASE_TABLET | Freq: Every day | ORAL | Status: DC
  Administered 2017-11-06 – 2017-11-09 (×4): 40 mg via ORAL
  Filled 2017-11-06 (×4): qty 1

## 2017-11-06 MED ORDER — FUROSEMIDE 10 MG/ML IJ SOLN
20.0000 mg | Freq: Once | INTRAMUSCULAR | Status: AC
  Administered 2017-11-06: 20 mg via INTRAVENOUS
  Filled 2017-11-06: qty 2

## 2017-11-06 MED ORDER — PRAVASTATIN SODIUM 40 MG PO TABS
40.0000 mg | ORAL_TABLET | Freq: Every day | ORAL | Status: DC
  Administered 2017-11-06 – 2017-11-09 (×4): 40 mg via ORAL
  Filled 2017-11-06 (×4): qty 1

## 2017-11-06 MED ORDER — ACETAMINOPHEN 325 MG PO TABS
650.0000 mg | ORAL_TABLET | Freq: Once | ORAL | Status: AC
  Administered 2017-11-06: 650 mg via ORAL
  Filled 2017-11-06: qty 2

## 2017-11-06 MED ORDER — SODIUM CHLORIDE 0.9 % IV SOLN
INTRAVENOUS | Status: AC
  Administered 2017-11-06: 20:00:00 via INTRAVENOUS

## 2017-11-06 MED ORDER — SODIUM CHLORIDE 0.9% FLUSH
3.0000 mL | Freq: Two times a day (BID) | INTRAVENOUS | Status: DC
  Administered 2017-11-07 – 2017-11-09 (×4): 3 mL via INTRAVENOUS

## 2017-11-06 MED ORDER — METOPROLOL TARTRATE 25 MG PO TABS
25.0000 mg | ORAL_TABLET | Freq: Two times a day (BID) | ORAL | Status: DC
  Administered 2017-11-06 – 2017-11-09 (×6): 25 mg via ORAL
  Filled 2017-11-06 (×6): qty 1

## 2017-11-06 NOTE — ED Triage Notes (Signed)
Patient arrived to ED via [facility] from home. EMS reports:  Patient had witnessed syncopal episode at home while family was transferring patient from bed to bedside commode. Lasted approx 30 sec - per family. Lowered to floor. No injuries. Patient had also fallen on Wednesday, and brought to ED. Bruising noted. Balloon in nare from that fall.  Patient AOx4.  BP 170/75, Pulse 92, 100% on room air. CBG 251.

## 2017-11-06 NOTE — ED Notes (Signed)
Patient transported to CT 

## 2017-11-06 NOTE — ED Notes (Signed)
Family refused to do orthostatic vital signs.

## 2017-11-06 NOTE — H&P (Signed)
History and Physical    ALIESE Gray YKD:[id number] DOB: 01/22/1928 DOA: 11/06/2017  PCP: Raven Gravel, MD Patient coming from: [place]  Chief Complaint: syncope  HPI: Raven Gray is a 84 y.o. female with medical history significant for tachybradycardia syndrome status post pacemaker, CVA, hypertension, hyperlipidemia, history of VT, A. fib on Xarelto and COPD presents emergency department with a chief complaint syncope. Right hospitalists are asked to admit.  Information is obtained from the patient and her daughter and granddaughter who are primary caregivers and at the bedside. She was discharged 2 days ago after suffering a fall as a result of a syncopal episode. She suffered nasal fracture. During that hospitalization cardiology was consulted who opined a history of permanent atrial fibrillation with pauses status post P p.m. as well as labile hypertension with evidence of orthostatic changes and cardiology opined anticoagulation to high risk. Family reports patient has not been eating and drinking her normal amount over the 2 days that she's been home. She's complained of a "burning" in both her feet particularly when weightbearing. This morning they were assisting her out of bed to transfer to the bedside commode. She voided and when bearing weight to put it back to bed she syncopized. Family called the health alert he recommended that Place her on the floor. Family placed her on the floor and said she woke up shortly thereafter. No report of fever chills cough nausea vomiting. No dysuria hematuria frequency or urgency. No diarrhea constipation melena bright red blood per rectum.  ED Course: In the emergency department she is afebrile with a blood pressure high end of normal. Mild tachypnea oxygen saturation level greater than 90% on 2 L  Review of Systems: As per HPI otherwise all other systems reviewed and are negative. She is provided with IV Lasix and supplementation. She ambulated to the bathroom  when her "leg gave out on her"  Ambulatory Status: Patient has had an unsteady gait for the last 6 months.  Past Medical History:  Diagnosis Date  . Atrial fibrillation ([place])    a. on Xarelto  . Chest pain   . COPD (chronic obstructive pulmonary disease) ([place])    family unsure if is copd,   . H/O echocardiogram    a. 08/2016: EF of 60-65%, severely dilated LA, mild pulmonic regurgitations, mild TR, and PA Pressure at 38 mm Hg  . History of depression   . History of DVT (deep vein thrombosis)   . History of pulmonary embolism   . Hyperlipidemia   . Hypertension   . Hypokalemia    Now improved with treatment  . Shortness of breath   . Stroke ([place])   . Tachy-brady syndrome ([place]) 04/01/2017   s/p MDT PPM     Past Surgical History:  Procedure Laterality Date  . PACEMAKER IMPLANT N/A 04/01/2017   Procedure: Pacemaker Implant;  Surgeon: Sanda Klein, MD;  Location: [facility];  Service: Cardiovascular;  Laterality: N/A; MDT Azure XT SR MRI  . REPLACEMENT TOTAL KNEE    . ROTATOR CUFF REPAIR      Social History   Socioeconomic History  . Marital status: Widowed    Spouse name: Not on file  . Number of children: Not on file  . Years of education: Not on file  . Highest education level: Not on file  Social Needs  . Financial resource strain: Not on file  . Food insecurity - worry: Not on file  . Food insecurity - inability: Not  on file  . Transportation needs - medical: Not on file  . Transportation needs - non-medical: Not on file  Occupational History  . Not on file  Tobacco Use  . Smoking status: Never Smoker  . Smokeless tobacco: Never Used  Substance and Sexual Activity  . Alcohol use: No  . Drug use: No  . Sexual activity: No  Other Topics Concern  . Not on file  Social History Narrative  . Not on file    No Known Allergies  Family History  Problem Relation Age of Onset  . Alzheimer's disease Mother   . Heart attack Father   . Diabetes type II  Daughter   . Heart disease Daughter        stents    Prior to Admission medications   Medication Sig Start Date End Date Taking? Authorizing Provider  acetaminophen (TYLENOL) 325 MG tablet Take 650 mg by mouth every 6 (six) hours as needed (for pain).    Yes [provider]  ALPRAZolam (XANAX) 0.25 MG tablet Take 1 tablet (0.25 mg total) by mouth 2 (two) times daily as needed for anxiety. 10/21/14  Yes Mikhail, Velta Addison, DO  cephALEXin (KEFLEX) 500 MG capsule Take 1 capsule (500 mg total) by mouth 2 (two) times daily for 7 days. 11/04/17 11/11/17 Yes Nita Sells, MD  furosemide (LASIX) 40 MG tablet Take 1 tablet (40 mg total) by mouth every morning. 01/10/17  Yes Domenic Polite, MD  metoprolol (LOPRESSOR) 25 MG tablet Take 1 tablet (25 mg total) by mouth 2 (two) times daily. 06/20/14  Yes Lorretta Harp, MD  Multiple Vitamins-Minerals (WOMENS 50+ [place] VITAMIN/MIN) TABS Take 1 tablet by mouth daily.   Yes [provider]  omeprazole (PRILOSEC) 20 MG capsule Take 20 mg by mouth daily as needed (for reflux/heartburn).  04/23/15  Yes [provider]  OXYGEN Inhale 2 L into the lungs continuous.    Yes [provider]  potassium chloride SA (K-DUR,KLOR-CON) 20 MEQ tablet Take 40 mEq by mouth daily.  01/13/17  Yes Isaiah Serge, NP  pravastatin (PRAVACHOL) 40 MG tablet Take 40 mg by mouth every morning.    Yes [provider]  venlafaxine XR (EFFEXOR-XR) 150 MG 24 hr capsule Take 150 mg by mouth daily.   Yes [provider]  venlafaxine XR (EFFEXOR-XR) 75 MG 24 hr capsule Take 1 capsule by mouth daily. 07/14/17  Yes [provider]  zolpidem (AMBIEN) 5 MG tablet Take 5 mg by mouth at bedtime. 11/03/13  Yes [provider]  nitroGLYCERIN (NITROSTAT) 0.4 MG SL tablet Place 0.4 mg under the tongue every 5 (five) minutes as needed for chest pain. X 3 doses 02/08/16   [provider]    Physical Exam: Vitals:    11/06/17 1100 11/06/17 1130 11/06/17 1200 11/06/17 1230  BP: 137/60 (!) 158/76 (!) 146/84 (!) 145/93  Pulse:      Resp: (!) 22 (!) 30 (!) 29 (!) 27  Temp:      TempSrc:      SpO2:      Weight:      Height:         General:  Appears calm and comfortable face with bruising Eyes:  PERRL, EOMI, normal lids, iris ENT:  grossly normal hearing, lips & tongue, mucous membranes pink but dry dried blood and bruising around her nares with rocket intact Neck:  no LAD, masses or thyromegaly Cardiovascular:  Irregularly irregular no m/r/g. No LE edema.  Respiratory:  Respirations slightly shallow no increased work of breathing breath sounds distant fine crackles bilateral bases no wheeze Abdomen:  soft, ntnd, bowel sounds throughout Skin:  no rash or induration seen on limited exam Musculoskeletal:  grossly normal tone BUE/BLE, good ROM, no bony abnormality Psychiatric:  grossly normal mood and affect, speech fluent and appropriate, AOx3 Neurologic:  CN 2-12 grossly intact, moves all extremities in coordinated fashion, sensation intact  Labs on Admission: I have personally reviewed following labs and imaging studies  CBC: Recent Labs  Lab 11/02/17 1127 11/03/17 0442 11/04/17 0447 11/06/17 1009  WBC 8.8 8.6 7.5 10.6*  NEUTROABS 5.8  --   --  7.5  HGB 14.8 13.7 13.3 13.4  HCT 48.2* 44.2 43.0 42.8  MCV 88.9 88.0 88.1 88.2  PLT 165 164 149* 542   Basic Metabolic Panel: Recent Labs  Lab 11/02/17 1127 11/02/17 1636 11/03/17 0442 11/04/17 0447 11/06/17 1009  NA 140  --  141 139 140  K 4.2  --  3.9 3.1* 3.6  CL 104  --  102 106 103  CO2 25  --  26 22 23   GLUCOSE 117*  --  128* 152* 121*  BUN 17  --  15 9 14   CREATININE 0.88  --  0.87 0.82 1.00  CALCIUM 9.0  --  9.0 8.5* 8.8*  MG  --  2.0  --   --  1.8   GFR: Estimated Creatinine Clearance: 36.5 mL/min (by C-G formula based on SCr of 1 mg/dL). Liver Function Tests: Recent Labs  Lab 11/02/17 1127 11/06/17 1009  AST 23 20    ALT 15 8*  ALKPHOS 100 75  BILITOT 0.9 1.3*  PROT 6.9 6.2*  ALBUMIN 3.7 2.9*   No results for input(s): LIPASE, AMYLASE in the last 168 hours. No results for input(s): AMMONIA in the last 168 hours. Coagulation Profile: No results for input(s): INR, PROTIME in the last 168 hours. Cardiac Enzymes: Recent Labs  Lab 11/02/17 1127 11/06/17 1009  CKTOTAL  --  113  TROPONINI <0.03  --    BNP (last 3 results) No results for input(s): PROBNP in the last 8760 hours. HbA1C: No results for input(s): HGBA1C in the last 72 hours. CBG: Recent Labs  Lab 11/03/17 0653  GLUCAP 103*   Lipid Profile: No results for input(s): CHOL, HDL, LDLCALC, TRIG, CHOLHDL, LDLDIRECT in the last 72 hours. Thyroid Function Tests: No results for input(s): TSH, T4TOTAL, FREET4, T3FREE, THYROIDAB in the last 72 hours. Anemia Panel: No results for input(s): VITAMINB12, FOLATE, FERRITIN, TIBC, IRON, RETICCTPCT in the last 72 hours. Urine analysis:    Component Value Date/Time   COLORURINE YELLOW 11/02/2017 1521   APPEARANCEUR CLOUDY (A) 11/02/2017 1521   LABSPEC 1.010 11/02/2017 1521   PHURINE 8.0 11/02/2017 1521   GLUCOSEU NEGATIVE 11/02/2017 1521   HGBUR NEGATIVE 11/02/2017 1521   BILIRUBINUR NEGATIVE 11/02/2017 1521   KETONESUR NEGATIVE 11/02/2017 1521   PROTEINUR NEGATIVE 11/02/2017 1521   UROBILINOGEN 1.0 10/20/2014 1614   NITRITE NEGATIVE 11/02/2017 1521   LEUKOCYTESUR NEGATIVE 11/02/2017 1521    Creatinine Clearance: Estimated Creatinine Clearance: 36.5 mL/min (by C-G formula based on SCr of 1 mg/dL).  Sepsis Labs: @LABRCNTIP (procalcitonin:4,lacticidven:4) )No results found for this or any previous visit (from the past 240 hour(s)).   Radiological Exams on Admission  EKG: Pacemaker spikes or artifacts Atrial fibrillation RBBB and LPFB Consider left ventricular hypertrophy No significant change since last  Assessment/Plan Principal Problem:   Syncope and collapse Active  Problems:    Aortic insufficiency   COPD (chronic obstructive pulmonary disease) (HCC)   Essential hypertension   GERD (gastroesophageal reflux disease)   Dyspnea on exertion   Chronic respiratory failure with hypoxia (HCC)   Syncope   Atrial fibrillation (HCC)   Nasal fracture   Hypertension   #1. Syncope and collapse. Likely related to orthostatics in the setting of dehydration due to decreased oral intake, Lasix. Also with a history of A. fib with pauses status post pacemaker. Chart review indicates 7 falls in 6 months. CT of the head of acute intracranial abnormality. CT angio chest no PE> Unable to obtain orthostatics due to family resistance. Pacer interrogated Medications also include Xanax and Ambien -Admit to telemetry -Obtain 2-D echo -PT/OT -Pain orthostatics as family agreed -Hold Lasix -physical therapy evaluation -palliative care for goals of care.   #2. Atrial fibrillation. Currently rate controlled. Status post pacemaker chadscore 6. EKG as noted above. CT NGO of the chest as noted above Evaluated by cardiology last hospitalization who opined ongoing anticoagulation too high of a risk given her frequent falls and Xarelto was discontinued -Continue home beta blocker -monitor  #3. Hypertension. The pressures the high end of normal in the emergency department. Home medications include Lasix and metoprolol. Is given intravenous Lasix in the emergency department. Rest recent echo April 2018 reveals moderate LVH with an EF of 55% mild MR, mild AR -Continue beta blocker -Lasix for now  #4. COPD with emphysema. Appears stable at baselin  #5. Chronic respiratory failure with hypoxia. Related to COPD. Appears stable at baseline -continue home med  #6. Nasal fracture. He had a fall 3 days ago. EDP at that time placed nasal tampon to left nare. Scheduled to be removed today by ear nose and throat. -EDP called ENT     DVT prophylaxis: scd  Code Status: dnr  Family Communication:  daughter and granddaugter  Disposition Plan: needs placement  Consults called: none  Admission status: obs    Dyanne Carrel M MD [facility]  If 7PM-7AM, please contact night-coverage [email or web address] Password [facility]  11/06/2017, 3:31 PM

## 2017-11-06 NOTE — ED Notes (Signed)
Patient currently in CT °

## 2017-11-06 NOTE — Progress Notes (Signed)
Palliative Medicine RN Note: Consult order noted. Patient is being admitted per EDP. Plan to see her once she is on the floor, likely 2/16 or 2/17  Vung Kush G. Glendon Fiser, RN, BSN, [facility] Palliative Medicine Team 11/06/2017 12:19 PM Office [phone]

## 2017-11-06 NOTE — Consult Note (Signed)
Raven Gray, Raven Gray 84 y.o., female [id number]     Chief Complaint: syncope  HPI: 84 yo wf, fell and fractured nose 4 nights ago.  On Xaralto at that time.  Anterior LEFT balloon pack placed for bleeding.  Readmitted today for LOC.  Packing due to be removed today.  No further bleeding.  Off Xaralto now.  PMH: Past Medical History:  Diagnosis Date  . Atrial fibrillation ([place])    a. on Xarelto  . Chest pain   . COPD (chronic obstructive pulmonary disease) ([place])    family unsure if is copd,   . H/O echocardiogram    a. 08/2016: EF of 60-65%, severely dilated LA, mild pulmonic regurgitations, mild TR, and PA Pressure at 38 mm Hg  . History of depression   . History of DVT (deep vein thrombosis)   . History of pulmonary embolism   . Hyperlipidemia   . Hypertension   . Hypokalemia    Now improved with treatment  . Shortness of breath   . Stroke ([place])   . Tachy-brady syndrome ([place]) 04/01/2017   s/p MDT PPM     Surg Hx: Past Surgical History:  Procedure Laterality Date  . PACEMAKER IMPLANT N/A 04/01/2017   Procedure: Pacemaker Implant;  Surgeon: Sanda Klein, MD;  Location: [facility];  Service: Cardiovascular;  Laterality: N/A; MDT Azure XT SR MRI  . REPLACEMENT TOTAL KNEE    . ROTATOR CUFF REPAIR      FHx:   Family History  Problem Relation Age of Onset  . Alzheimer's disease Mother   . Heart attack Father   . Diabetes type II Daughter   . Heart disease Daughter        stents   SocHx:  reports that  has never smoked. she has never used smokeless tobacco. She reports that she does not drink alcohol or use drugs.  ALLERGIES: No Known Allergies  Medications Prior to Admission  Medication Sig Dispense Refill  . acetaminophen (TYLENOL) 325 MG tablet Take 650 mg by mouth every 6 (six) hours as needed (for pain).     Marland Kitchen ALPRAZolam (XANAX) 0.25 MG tablet Take 1 tablet (0.25 mg total) by mouth 2 (two) times daily as needed for anxiety. 30 tablet 0  . cephALEXin (KEFLEX) 500  MG capsule Take 1 capsule (500 mg total) by mouth 2 (two) times daily for 7 days. 14 capsule 0  . furosemide (LASIX) 40 MG tablet Take 1 tablet (40 mg total) by mouth every morning.    . metoprolol (LOPRESSOR) 25 MG tablet Take 1 tablet (25 mg total) by mouth 2 (two) times daily. 180 tablet 3  . Multiple Vitamins-Minerals (WOMENS 50+ MULTI VITAMIN/MIN) TABS Take 1 tablet by mouth daily.    Marland Kitchen omeprazole (PRILOSEC) 20 MG capsule Take 20 mg by mouth daily as needed (for reflux/heartburn).   11  . OXYGEN Inhale 2 L into the lungs continuous.     . potassium chloride SA (K-DUR,KLOR-CON) 20 MEQ tablet Take 40 mEq by mouth daily.  90 tablet 3  . pravastatin (PRAVACHOL) 40 MG tablet Take 40 mg by mouth every morning.     . venlafaxine XR (EFFEXOR-XR) 150 MG 24 hr capsule Take 150 mg by mouth daily.    Marland Kitchen venlafaxine XR (EFFEXOR-XR) 75 MG 24 hr capsule Take 1 capsule by mouth daily.    Marland Kitchen zolpidem (AMBIEN) 5 MG tablet Take 5 mg by mouth at bedtime.    . nitroGLYCERIN (NITROSTAT) 0.4 MG SL tablet  Place 0.4 mg under the tongue every 5 (five) minutes as needed for chest pain. X 3 doses  0    Results for orders placed or performed during the hospital encounter of 11/06/17 (from the past 48 hour(s))  Brain natriuretic peptide     Status: Abnormal   Collection Time: 11/06/17  9:46 AM  Result Value Ref Range   B Natriuretic Peptide 450.1 (H) 0.0 - 100.0 pg/mL    Comment: Performed at [facility] [address] [place], [place] [address]  Magnesium     Status: None   Collection Time: 11/06/17 10:09 AM  Result Value Ref Range   Magnesium 1.8 1.7 - 2.4 mg/dL    Comment: Performed at [facility] [address], [place], [place] [address]  Comprehensive metabolic panel     Status: Abnormal   Collection Time: 11/06/17 10:09 AM  Result Value Ref Range   Sodium 140 135 - 145 mmol/L   Potassium 3.6 3.5 - 5.1 mmol/L   Chloride 103 101 - 111 mmol/L   CO2 23 22 - 32 mmol/L   Glucose, Bld 121 (H)  65 - 99 mg/dL   BUN 14 6 - 20 mg/dL   Creatinine, Ser 1.00 0.44 - 1.00 mg/dL   Calcium 8.8 (L) 8.9 - 10.3 mg/dL   Total Protein 6.2 (L) 6.5 - 8.1 g/dL   Albumin 2.9 (L) 3.5 - 5.0 g/dL   AST 20 15 - 41 U/L   ALT 8 (L) 14 - 54 U/L   Alkaline Phosphatase 75 38 - 126 U/L   Total Bilirubin 1.3 (H) 0.3 - 1.2 mg/dL   GFR calc non Af Amer 49 (L) >60 mL/min   GFR calc Af Amer 57 (L) >60 mL/min    Comment: (NOTE) The eGFR has been calculated using the CKD EPI equation. This calculation has not been validated in all clinical situations. eGFR's persistently <60 mL/min signify possible Chronic Kidney Disease.    Anion gap 14 5 - 15    Comment: Performed at [facility] [address], [place], [place] [address]  CBC with Differential     Status: Abnormal   Collection Time: 11/06/17 10:09 AM  Result Value Ref Range   WBC 10.6 (H) 4.0 - 10.5 K/uL    Comment: WHITE COUNT CONFIRMED ON SMEAR   RBC 4.85 3.87 - 5.11 MIL/uL   Hemoglobin 13.4 12.0 - 15.0 g/dL   HCT 42.8 36.0 - 46.0 %   MCV 88.2 78.0 - 100.0 fL   MCH 27.6 26.0 - 34.0 pg   MCHC 31.3 30.0 - 36.0 g/dL   RDW 16.3 (H) 11.5 - 15.5 %   Platelets 156 150 - 400 K/uL    Comment: SPECIMEN CHECKED FOR CLOTS PLATELET COUNT CONFIRMED BY SMEAR    Neutrophils Relative % 71 %   Lymphocytes Relative 18 %   Monocytes Relative 10 %   Eosinophils Relative 1 %   Basophils Relative 0 %   Neutro Abs 7.5 1.7 - 7.7 K/uL   Lymphs Abs 1.9 0.7 - 4.0 K/uL   Monocytes Absolute 1.1 (H) 0.1 - 1.0 K/uL   Eosinophils Absolute 0.1 0.0 - 0.7 K/uL   Basophils Absolute 0.0 0.0 - 0.1 K/uL   Smear Review MORPHOLOGY UNREMARKABLE     Comment: Performed at [facility], [place] [address] [place], [place] [address]  CK     Status: None   Collection Time: 11/06/17 10:09 AM  Result  Value Ref Range   Total CK 113 38 - 234 U/L    Comment: Performed at [facility], [place] [address], [place], [place] [address]  I-Stat Troponin, ED (not at [facility])      Status: None   Collection Time: 11/06/17 10:13 AM  Result Value Ref Range   Troponin i, poc 0.01 0.00 - 0.08 ng/mL   Comment 3            Comment: Due to the release kinetics of cTnI, a negative result within the first hours of the onset of symptoms does not rule out myocardial infarction with certainty. If myocardial infarction is still suspected, repeat the test at appropriate intervals.        Blood pressure (!) 147/73, pulse 76, temperature 97.6 F (36.4 C), temperature source Oral, resp. rate (!) 22, height 5' 1" (1.549 m), weight 74.4 kg (164 lb), SpO2 90 %.  PHYSICAL EXAM: Overall appearance:  She is cheerful and energetic.  There is a balloon pack in her LEFT nose.  Moderate mid facial ecchymoses.   Head: NCAT Ears:  Not examined Nose:  Concave to LEFT Oral Cavity: not examined Oral Pharynx/Hypopharynx/Larynx:  Not examined Neuro:  Grossly intact Neck: not examined  Studies Reviewed: none    Assessment/Plan LEFT epistaxis following fall with nasal fx and anti-coagulation.  Plan: pack removed without difficulty.  No bleeding.  WIll institute nasal hygiene measures.  Recheck our office prn.  Jodi Marble 7/51/0258, 5:30 PM

## 2017-11-06 NOTE — ED Provider Notes (Signed)
[facility] Provider Note   CSN: [id number] Arrival date & time: 11/06/17  9381     History   Chief Complaint Chief Complaint  Patient presents with  . Loss of Consciousness    HPI Raven Gray is a 84 y.o. female with history of A. fib, COPD, PE, CVA, Tachy-brady syndrome, HLD, who presents today from home via EMS with a chief complaint of syncopal episode.  She was discharged from the hospital on 2/13 for syncope and fall and discharged home.  She was supposed to follow-up with ENT this morning to have nasal packing removed.  Family reports that the patient was sitting on the toilet after voiding when she started to fall forward.  Family states that her arms went limp and her eyes appear glassy.  She was not responding to voice.  They report that initially she began taking shallow breaths followed by deeper breaths and started breathing heavy and fast followed by taking shallow breaths.  The episode lasted approximately 5-10 minutes.  She did not respond to voice for approximately 5 minutes.  Family contacted life alert and was told to help the patient down into the floor.  After helping the patient from the toilet down onto the floor she began to respond to voice within the next few minutes.  They deny shaking or jerking or urinary or fecal incontinence.  Family reports that up until the patient most recent admission that she lives alone.  Since she was discharged on 213 she has required 24-hour around-the-clock care.  She has been unable to ambulate since discharge.  Since yesterday, the patient complains of worsening left leg pain and has refused to move or bear weight on the left leg.  The patient states that her left foot is painful.  No right leg pain.   She denies fever, chills, nausea, vomiting, diarrhea, chest pain, headache, lightheadedness, rash, or URI symptoms.  The history is provided by the patient and a relative. No language interpreter was used.     Past Medical History:  Diagnosis Date  . Atrial fibrillation ([place])    a. on Xarelto  . Chest pain   . COPD (chronic obstructive pulmonary disease) ([place])    family unsure if is copd,   . H/O echocardiogram    a. 08/2016: EF of 60-65%, severely dilated LA, mild pulmonic regurgitations, mild TR, and PA Pressure at 38 mm Hg  . History of depression   . History of DVT (deep vein thrombosis)   . History of pulmonary embolism   . Hyperlipidemia   . Hypertension   . Hypokalemia    Now improved with treatment  . Shortness of breath   . Stroke ([place])   . Tachy-brady syndrome ([place]) 04/01/2017   s/p MDT PPM     Patient Active Problem List   Diagnosis Date Noted  . Epistaxis   . Syncope 11/02/2017  . Atrial fibrillation ([place]) 11/02/2017  . Nasal fracture 11/02/2017  . Fall at home 11/02/2017  . Hypertension 11/02/2017  . COPD with emphysema ([place]) 11/02/2017  . HLD (hyperlipidemia) 11/02/2017  . History of pulmonary embolism 07/21/2017  . Nonsustained ventricular tachycardia ([place]) 07/21/2017  . Healthcare-associated pneumonia 07/01/2017  . Acute respiratory failure with hypoxia ([place]) 07/01/2017  . Acute diastolic heart failure ([place]) 07/01/2017  . Conjunctivitis 07/01/2017  . Sepsis ([place]) 07/01/2017  . HCAP (healthcare-associated pneumonia)   . Acute pulmonary edema ([facility])   . Tachycardia-bradycardia syndrome ([place]) 04/01/2017  . Pacemaker  04/01/2017  . Chronic atrial fibrillation ([place])   . Syncope and collapse   . Loss of consciousness ([place]) 01/08/2017  . Dehydration 11/25/2016  . Acute encephalopathy   . Encephalopathy 11/24/2016  . Dyspnea on exertion 10/29/2016  . Chronic respiratory failure with hypoxia ([place]) 10/29/2016  . Long term current use of anticoagulant 10/23/2016  . TIA (transient ischemic attack) 09/06/2016  . COPD (chronic obstructive pulmonary disease) ([facility]) 09/06/2016  . Essential hypertension 09/06/2016  . GERD (gastroesophageal reflux disease) 09/06/2016   . Depression 09/06/2016  . Hyperlipidemia 10/23/2015  . Aortic insufficiency 12/15/2014  . Edema of both legs 09/19/2014  . Atrial fibrillation with slow ventricular response ([place]) 11/21/2013  . Chest pain 11/21/2013  . Community acquired pneumonia 06/19/2012  . Pulmonary embolism ([place]) 06/19/2012  . Hypokalemia 06/18/2012  . COPD with acute exacerbation ([place]) 06/18/2012  . Hypoxia 06/18/2012    Past Surgical History:  Procedure Laterality Date  . PACEMAKER IMPLANT N/A 04/01/2017   Procedure: Pacemaker Implant;  Surgeon: Sanda Klein, MD;  Location: [facility];  Service: Cardiovascular;  Laterality: N/A; MDT Azure XT SR MRI  . REPLACEMENT TOTAL KNEE    . ROTATOR CUFF REPAIR      OB History    No data available       Home Medications    Prior to Admission medications   Medication Sig Start Date End Date Taking? Authorizing Provider  acetaminophen (TYLENOL) 325 MG tablet Take 650 mg by mouth every 6 (six) hours as needed (for pain).    Yes [provider]  ALPRAZolam (XANAX) 0.25 MG tablet Take 1 tablet (0.25 mg total) by mouth 2 (two) times daily as needed for anxiety. 10/21/14  Yes Mikhail, Velta Addison, DO  cephALEXin (KEFLEX) 500 MG capsule Take 1 capsule (500 mg total) by mouth 2 (two) times daily for 7 days. 11/04/17 11/11/17 Yes Nita Sells, MD  furosemide (LASIX) 40 MG tablet Take 1 tablet (40 mg total) by mouth every morning. 01/10/17  Yes Domenic Polite, MD  metoprolol (LOPRESSOR) 25 MG tablet Take 1 tablet (25 mg total) by mouth 2 (two) times daily. 06/20/14  Yes Lorretta Harp, MD  Multiple Vitamins-Minerals (WOMENS 50+ [place] VITAMIN/MIN) TABS Take 1 tablet by mouth daily.   Yes [provider]  omeprazole (PRILOSEC) 20 MG capsule Take 20 mg by mouth daily as needed (for reflux/heartburn).  04/23/15  Yes [provider]  OXYGEN Inhale 2 L into the lungs continuous.    Yes [provider]  potassium chloride SA  (K-DUR,KLOR-CON) 20 MEQ tablet Take 40 mEq by mouth daily.  01/13/17  Yes Isaiah Serge, NP  pravastatin (PRAVACHOL) 40 MG tablet Take 40 mg by mouth every morning.    Yes [provider]  venlafaxine XR (EFFEXOR-XR) 150 MG 24 hr capsule Take 150 mg by mouth daily.   Yes [provider]  venlafaxine XR (EFFEXOR-XR) 75 MG 24 hr capsule Take 1 capsule by mouth daily. 07/14/17  Yes [provider]  zolpidem (AMBIEN) 5 MG tablet Take 5 mg by mouth at bedtime. 11/03/13  Yes [provider]  nitroGLYCERIN (NITROSTAT) 0.4 MG SL tablet Place 0.4 mg under the tongue every 5 (five) minutes as needed for chest pain. X 3 doses 02/08/16   [provider]    Family History Family History  Problem Relation Age of Onset  . Alzheimer's disease Mother   . Heart attack Father   . Diabetes type II Daughter   .  Heart disease Daughter        stents    Social History Social History   Tobacco Use  . Smoking status: Never Smoker  . Smokeless tobacco: Never Used  Substance Use Topics  . Alcohol use: No  . Drug use: No     Allergies   Patient has no known allergies.   Review of Systems Review of Systems  Constitutional: Negative for activity change, chills and fever.  HENT: Positive for facial swelling. Negative for sinus pain.   Eyes: Negative for visual disturbance.  Respiratory: Positive for shortness of breath.   Cardiovascular: Negative for chest pain.  Gastrointestinal: Negative for abdominal pain, diarrhea, nausea and vomiting.  Genitourinary: Negative for dysuria and hematuria.  Musculoskeletal: Positive for arthralgias, gait problem and myalgias. Negative for back pain, neck pain and neck stiffness.  Skin: Negative for rash.  Allergic/Immunologic: Negative for immunocompromised state.  Neurological: Positive for syncope and weakness. Negative for dizziness, light-headedness and headaches.  Hematological: Bruises/bleeds easily.    Psychiatric/Behavioral: Negative for confusion.     Physical Exam Updated Vital Signs BP (!) 147/73 (BP Location: Left Arm)   Pulse 76   Temp 97.6 F (36.4 C) (Oral)   Resp (!) 22   Ht 5\' 1"  (1.549 m)   Wt 74.4 kg (164 lb)   SpO2 90%   BMI 30.99 kg/m   Physical Exam  Constitutional: No distress.  HENT:  Head: Normocephalic.  Right Ear: External ear normal.  Left Ear: External ear normal.  Ecchymosis noted around the bilateral eyes.  Obvious nasal deformity.  Nasal packing present in the left nare.  Dried blood present on the hard palate and posterior oropharynx.  No bright red blood.  Eyes: Conjunctivae and EOM are normal. Pupils are equal, round, and reactive to light.  Neck: Normal range of motion. Neck supple.  Cardiovascular: Normal rate and regular rhythm. Exam reveals no gallop and no friction rub.  No murmur heard. Pulmonary/Chest: Effort normal. No respiratory distress.  Abdominal: Soft. Bowel sounds are normal. She exhibits no distension and no mass. There is no tenderness. There is no rebound and no guarding. No hernia.  Musculoskeletal: She exhibits edema and tenderness. She exhibits no deformity.  1+ pitting edema to the bilateral lower extremities.  Tender to palpation over the dorsum of the left foot.  The dorsum of the left foot is slightly erythematous.  Full active and passive range of motion of the bilateral ankles.  Left ankle is painful with range of motion.  No tenderness over the bilateral calves.  Bilateral knees and hips are nontender to palpation.  Neurological: She is alert.  Cranial nerves II through XII are grossly intact.  5 out of 5 strength of the bilateral upper and lower extremities.  Sensation is intact throughout.  Gait exam deferred secondary to complaints.  She is able to complete finger to nose with the right hand, however, it takes her approximately 45 seconds to a minute to move her finger from nose to finger..  With finger to nose on the  left hand, the patient falls asleep mid exam.  A and O x3.  Skin: Skin is warm. Capillary refill takes less than 2 seconds. No rash noted.  Psychiatric: Her behavior is normal.  Nursing note and vitals reviewed.    ED Treatments / Results  Labs (all labs ordered are listed, but only abnormal results are displayed) Labs Reviewed  COMPREHENSIVE METABOLIC PANEL - Abnormal; Notable for the following components:  Result Value   Glucose, Bld 121 (*)    Calcium 8.8 (*)    Total Protein 6.2 (*)    Albumin 2.9 (*)    ALT 8 (*)    Total Bilirubin 1.3 (*)    GFR calc non Af Amer 49 (*)    GFR calc Af Amer 57 (*)    All other components within normal limits  CBC WITH DIFFERENTIAL/PLATELET - Abnormal; Notable for the following components:   WBC 10.6 (*)    RDW 16.3 (*)    Monocytes Absolute 1.1 (*)    All other components within normal limits  BRAIN NATRIURETIC PEPTIDE - Abnormal; Notable for the following components:   B Natriuretic Peptide 450.1 (*)    All other components within normal limits  MAGNESIUM  CK  URINALYSIS, ROUTINE W REFLEX MICROSCOPIC  BASIC METABOLIC PANEL  CBC  I-STAT TROPONIN, ED    EKG  EKG Interpretation  Date/Time:  Friday November 06 2017 11:39:33 EST Ventricular Rate:  85 PR Interval:    QRS Duration: 158 QT Interval:  400 QTC Calculation: 476 R Axis:   92 Text Interpretation:  Pacemaker spikes or artifacts Atrial fibrillation RBBB and LPFB Consider left ventricular hypertrophy No significant change since last tracing Confirmed by Duffy Bruce [phone]) on 11/06/2017 11:59:26 AM       Radiology Dg Chest 2 View  Result Date: 11/06/2017 CLINICAL DATA:  Syncopal episode EXAM: CHEST  2 VIEW COMPARISON:  11/02/2017 FINDINGS: The lungs are hyperinflated likely secondary to COPD. There is no focal parenchymal opacity. There is no pleural effusion or pneumothorax. There is stable cardiomegaly. There is thoracic aortic atherosclerosis. There is a single  lead cardiac pacemaker. There is mild osteoarthritis of bilateral glenohumeral joints. IMPRESSION: No active cardiopulmonary disease. Electronically Signed   By: Kathreen Devoid   On: 11/06/2017 11:10   Ct Head Wo Contrast  Result Date: 11/06/2017 CLINICAL DATA:  Syncope recurrent.  Recent fall with head injury EXAM: CT HEAD WITHOUT CONTRAST TECHNIQUE: Contiguous axial images were obtained from the base of the skull through the vertex without intravenous contrast. COMPARISON:  CT head 11/02/2017 FINDINGS: Brain: Moderate atrophy. Moderate chronic microvascular ischemic changes throughout the white matter. Small chronic infarct left occipital lobe unchanged. Negative for acute infarct. Negative for intracranial hemorrhage or mass. No midline shift. Vascular: Negative for hyperdense vessel Skull: Negative fracture of the nasal bone again noted. No other skull fracture. Sinuses/Orbits: Air-fluid levels in the sphenoid sinus remains. Improvement in air-fluid levels in the maxillary sinus bilaterally which have resolved. Mild mucosal edema in the paranasal sinuses. Bilateral cataract removal. Other: None IMPRESSION: Atrophy and chronic ischemic change. No acute intracranial abnormality Nasal bone fracture again noted with air-fluid level in the sphenoid sinus. Resolution of bilateral maxillary sinus air-fluid levels. Electronically Signed   By: Franchot Gallo M.D.   On: 11/06/2017 11:03   Ct Angio Chest Pe W And/or Wo Contrast  Result Date: 11/06/2017 CLINICAL DATA:  Syncopal episode this morning. EXAM: CT ANGIOGRAPHY CHEST WITH CONTRAST TECHNIQUE: Multidetector CT imaging of the chest was performed using the standard protocol during bolus administration of intravenous contrast. Multiplanar CT image reconstructions and MIPs were obtained to evaluate the vascular anatomy. CONTRAST:  78mL ISOVUE-370 IOPAMIDOL (ISOVUE-370) INJECTION 76% COMPARISON:  Chest radiograph 11/06/2017 FINDINGS: Cardiovascular: Enlarged heart.  No pericardial effusion. Calcific atherosclerotic disease of the aorta and coronary arteries. Cardiac pacemaker with appropriate lead position. No evidence of pulmonary embolus. Mediastinum/Nodes: No enlarged mediastinal, hilar, or axillary lymph  nodes. Thyroid gland, trachea, and esophagus demonstrate no significant findings. Large hiatal hernia. Lungs/Pleura: Mild chronic interstitial lung changes. Two adjacent subpleural pulmonary nodules in the medial basilar portion the right lung, the larger measuring 8 mm. Image 75/105, sequence 8. Upper Abdomen: Left nonobstructive renal calculi. Musculoskeletal: Scoliosis and findings of diffuse idiopathic skeletal hyperostosis of the thoracic spine. Review of the MIP images confirms the above findings. IMPRESSION: No evidence of pulmonary embolus. Mild fusiform dilation of the ascending aorta measuring 4.3 cm in maximum diameter. Recommend annual imaging followup by CTA or MRA. This recommendation follows 2010 ACCF/AHA/AATS/ACR/ASA/SCA/SCAI/SIR/STS/SVM Guidelines for the Diagnosis and Management of Patients with Thoracic Aortic Disease. Circulation. 2010; 121: Z993-T701 Right-sided pulmonary nodules, the larger measuring 8 mm. Non-contrast chest CT at 3-6 months is recommended. If the nodules are stable at time of repeat CT, then future CT at 18-24 months (from today's scan) is considered optional for low-risk patients, but is recommended for high-risk patients. This recommendation follows the consensus statement: Guidelines for Management of Incidental Pulmonary Nodules Detected on CT Images: From the Fleischner Society 2017; Radiology 2017; 284:228-243. Enlarged heart. Aortic Atherosclerosis (ICD10-I70.0). Electronically Signed   By: Fidela Salisbury M.D.   On: 11/06/2017 14:41   Ct Lumbar Spine Wo Contrast  Result Date: 11/06/2017 CLINICAL DATA:  Syncope.  Back pain. EXAM: CT LUMBAR SPINE WITHOUT CONTRAST TECHNIQUE: Multidetector CT imaging of the lumbar spine  was performed without intravenous contrast administration. Multiplanar CT image reconstructions were also generated. COMPARISON:  CT lumbar spine 10/07/2017 FINDINGS: Segmentation: Normal segmentation. Moderate dextroscoliosis at L2-3 is unchanged. Alignment: Mild retrolisthesis L1-2, L2-3, L3-4 unchanged. Vertebrae: Negative for acute fracture. Superior endplate deformity at L2 is unchanged and appears chronic. Paraspinal and other soft tissues: Calcific atherosclerotic disease in the aorta. Bilateral nonobstructing renal calculi. 3 cm right upper pole cyst unchanged. No retroperitoneal adenopathy Disc levels: T12-L1: Moderate disc degeneration and spurring without significant stenosis L1-2: Mild disc and facet degeneration without significant stenosis L2-3: Disc degeneration and spurring left greater than right. Retrolisthesis. Moderate left foraminal encroachment due to spurring. Subarticular stenosis on the left due to spurring. Spinal canal adequate in size. L3-4: Disc degeneration and spurring, left greater than right. Moderate facet hypertrophy bilaterally. Mild spinal stenosis. Moderate subarticular and foraminal stenosis on the left. L4-5: Interspinous fusion device unchanged in position. Posterior bony fusion. Marked facet hypertrophy. Moderate spinal stenosis unchanged. Right subarticular and foraminal stenosis due to spurring unchanged. L5-S1: Disc degeneration and spurring, right greater than left. Moderate right foraminal encroachment due to spurring unchanged. Bilateral facet hypertrophy contributes to foraminal stenosis. IMPRESSION: Negative for acute fracture Scoliosis and multilevel degenerative changes similar to the recent CT of 10/07/2017. Electronically Signed   By: Franchot Gallo M.D.   On: 11/06/2017 14:46   Dg Hips Bilat W Or Wo Pelvis 5 Views  Result Date: 11/06/2017 CLINICAL DATA:  Pain.  Recent fall EXAM: DG HIP (WITH OR WITHOUT PELVIS) 5+V BILAT COMPARISON:  None. FINDINGS: Frontal  pelvis as well as frontal and lateral hips bilaterally-total five views-obtained. No evident acute fracture or dislocation. There is mild symmetric narrowing of both hip joints. No erosive change. Sacroiliac joints appear symmetric bilaterally. Bones are osteoporotic. IMPRESSION: Bones osteoporotic. No acute fracture or dislocation. Mild symmetric narrowing both hip joints. Electronically Signed   By: Lowella Grip III M.D.   On: 11/06/2017 13:31   Vas [place] Lower Extremity Venous (dvt) (only [facility])  Result Date: 11/06/2017  Lower Venous Study Indication: Edema. Examination Guidelines: A  complete evaluation includes B-mode imaging, spectral doppler, color doppler, and power doppler as needed of all accessible portions of each vessel. Bilateral testing is considered an integral part of a complete examination. Limited examinations for reoccurring indications may be performed as noted. The reflux portion of the exam is performed with the patient in reverse Trendelenburg.  Right Venous Findings: +---------+---------------+---------+-----------+----------+-------+          CompressibilityPhasicitySpontaneityPropertiesSummary +---------+---------------+---------+-----------+----------+-------+ CFV      Full           Yes      Yes                          +---------+---------------+---------+-----------+----------+-------+ FV Prox  Full                                                 +---------+---------------+---------+-----------+----------+-------+ FV Mid   Full                                                 +---------+---------------+---------+-----------+----------+-------+ FV DistalFull                                                 +---------+---------------+---------+-----------+----------+-------+ PFV      Full                                                 +---------+---------------+---------+-----------+----------+-------+ POP      Full           Yes       Yes                          +---------+---------------+---------+-----------+----------+-------+ PTV      Full                                                 +---------+---------------+---------+-----------+----------+-------+ PERO     Full                                                 +---------+---------------+---------+-----------+----------+-------+  Left Venous Findings: +---------+---------------+---------+-----------+----------+-------+          CompressibilityPhasicitySpontaneityPropertiesSummary +---------+---------------+---------+-----------+----------+-------+ CFV      Full           Yes      Yes                          +---------+---------------+---------+-----------+----------+-------+ FV Prox  Full                                                 +---------+---------------+---------+-----------+----------+-------+  FV Mid   Full                                                 +---------+---------------+---------+-----------+----------+-------+ FV DistalFull                                                 +---------+---------------+---------+-----------+----------+-------+ PFV      Full                                                 +---------+---------------+---------+-----------+----------+-------+ POP      Full           Yes      Yes                          +---------+---------------+---------+-----------+----------+-------+ PTV      Full                                                 +---------+---------------+---------+-----------+----------+-------+ PERO     Full                                                 +---------+---------------+---------+-----------+----------+-------+    Final Interpretation: Right: There is no evidence of deep vein thrombosis in the lower extremity. No cystic structure found in the popliteal fossa. Left: There is no evidence of deep vein thrombosis in the lower extremity. A cystic  structure is found in the popliteal fossa.  *See table(s) above for measurements and observations. Electronically signed by Deitra Mayo on 11/06/2017 at 3:29:56 PM.     Procedures Procedures (including critical care time)  Medications Ordered in ED Medications  cephALEXin (KEFLEX) capsule 500 mg (not administered)  metoprolol tartrate (LOPRESSOR) tablet 25 mg (not administered)  pantoprazole (PROTONIX) EC tablet 40 mg (not administered)  pravastatin (PRAVACHOL) tablet 40 mg (40 mg Oral Given 11/06/17 1704)  venlafaxine XR (EFFEXOR-XR) 24 hr capsule 225 mg (not administered)  sodium chloride flush (NS) 0.9 % injection 3 mL (not administered)  0.9 %  sodium chloride infusion (not administered)  acetaminophen (TYLENOL) tablet 650 mg (not administered)    Or  acetaminophen (TYLENOL) suppository 650 mg (not administered)  senna-docusate (Senokot-S) tablet 1 tablet (not administered)  traZODone (DESYREL) tablet 25 mg (not administered)  ondansetron (ZOFRAN) tablet 4 mg (not administered)    Or  ondansetron (ZOFRAN) injection 4 mg (not administered)  acetaminophen (TYLENOL) tablet 650 mg (650 mg Oral Given 11/06/17 1704)  iopamidol (ISOVUE-370) 76 % injection (80 mLs  Contrast Given 11/06/17 1403)  furosemide (LASIX) injection 20 mg (20 mg Intravenous Given 11/06/17 1704)     Initial Impression / Assessment and Plan / ED Course  I have reviewed the triage vital signs and the nursing notes.  Pertinent labs & imaging results that  were available during my care of the patient were reviewed by me and considered in my medical decision making (see chart for details).     84 year old female with history of A. fib, COPD, PE, CVA, Tachy-brady syndrome, HLD presenting after a witnessed syncopal episode today.  She was just discharged from the hospital on 2/13 after sustaining a fall secondary to a syncopal episode.  Workup revealed a nasal fracture and nasal packing was placed, which was  supposed to be removed this morning during a follow-up ENT visit.  The appointment was missed secondary to the episode of syncope this morning.   Reports that since she was discharged from the hospital that she is unable to care for herself and is requiring 24-hour care, family is taking turns. She was able to ambulate prior to her recent hospitalization, but has required a full lift since she was discharged.   Since discharge.  She endorses left leg pain and has been unable to bear weight on the left foot since discharge.  Controlled with Tylenol in the ED.  Xarelto for A. fib was stopped during last ED visit.  CT chest angiogram is negative for PE, but does demonstrate a 4.3 cm fusiform dilation of the ascending aorta.  These results have been discussed with the family.  Ultrasound for DVT is negative.  Workup for the syncopal episode today has been largely unremarkable, pending investigation of the patient's pacemaker.  BNP was slightly elevated; IV Lasix administered.  The patient was also seen and evaluated by Dr. Ellender Hose, attending physician.  The left hip appears to be slightly shortened and externally rotated on exam.  Hip x-rays and lumbar spine CT are negative for fracture.  Given recurrent syncopal episode and acute inability to walk, admission is recommended for further workup and evaluation.  Dyanne Carrel, NP, from the hospitalist service has accepted the admission.   The patient's daughter, her POA, has also requested to speak with palliative care.  Palliative care was unable to see the patient in the ED today, but will see her tomorrow after admission; however, the patient's daughter is not ready to let the patient know yet that she is considering palliative care. Spoke with Dr. Erik Obey, ENT, who will come and remove nasal packing later today.   4:25 PM-reevaluation.  Patient is much more awake and alert.  Discussed with family that the patient has been accepted for admission.  The  patient's lower extremities were reevaluated since mentation had improved.  Further evaluation, the patient's left knee appears more edematous than the right knee.  No focal tenderness to palpation of the medial or lateral joint line.  No history of surgery on the left knee.  Left knee x-ray ordered to further evaluate patient's acute inability to ambulate.  Final Clinical Impressions(s) / ED Diagnoses   Final diagnoses:  Syncope and collapse    ED Discharge Orders    None       Joanne Gavel, PA-C 11/06/17 1717    Duffy Bruce, MD 11/07/17 [phone]

## 2017-11-06 NOTE — Progress Notes (Signed)
LE venous duplex prelim: negative for DVT. Amariyon Maynes Eunice, RDMS, RVT  

## 2017-11-07 ENCOUNTER — Observation Stay (HOSPITAL_BASED_OUTPATIENT_CLINIC_OR_DEPARTMENT_OTHER): Payer: Medicare Other

## 2017-11-07 ENCOUNTER — Other Ambulatory Visit (HOSPITAL_COMMUNITY): Payer: Medicare Other

## 2017-11-07 ENCOUNTER — Other Ambulatory Visit: Payer: Self-pay

## 2017-11-07 DIAGNOSIS — Z515 Encounter for palliative care: Secondary | ICD-10-CM

## 2017-11-07 DIAGNOSIS — I482 Chronic atrial fibrillation: Secondary | ICD-10-CM | POA: Diagnosis not present

## 2017-11-07 DIAGNOSIS — I361 Nonrheumatic tricuspid (valve) insufficiency: Secondary | ICD-10-CM

## 2017-11-07 DIAGNOSIS — R55 Syncope and collapse: Secondary | ICD-10-CM | POA: Diagnosis not present

## 2017-11-07 LAB — CBC
HCT: 38.4 % (ref 36.0–46.0)
HEMOGLOBIN: 11.9 g/dL — AB (ref 12.0–15.0)
MCH: 27.1 pg (ref 26.0–34.0)
MCHC: 31 g/dL (ref 30.0–36.0)
MCV: 87.5 fL (ref 78.0–100.0)
Platelets: 171 10*3/uL (ref 150–400)
RBC: 4.39 MIL/uL (ref 3.87–5.11)
RDW: 16 % — ABNORMAL HIGH (ref 11.5–15.5)
WBC: 9 10*3/uL (ref 4.0–10.5)

## 2017-11-07 LAB — BASIC METABOLIC PANEL
ANION GAP: 11 (ref 5–15)
BUN: 17 mg/dL (ref 6–20)
CALCIUM: 8.4 mg/dL — AB (ref 8.9–10.3)
CO2: 24 mmol/L (ref 22–32)
Chloride: 103 mmol/L (ref 101–111)
Creatinine, Ser: 0.87 mg/dL (ref 0.44–1.00)
GFR calc Af Amer: >60 mL/min (ref >60)
GFR calc non Af Amer: 58 mL/min — ABNORMAL LOW (ref >60)
GLUCOSE: 116 mg/dL — AB (ref 65–99)
Potassium: 3.3 mmol/L — ABNORMAL LOW (ref 3.5–5.1)
Sodium: 138 mmol/L (ref 135–145)

## 2017-11-07 LAB — GLUCOSE, CAPILLARY: Glucose-Capillary: 117 mg/dL — ABNORMAL HIGH (ref 65–99)

## 2017-11-07 MED ORDER — POTASSIUM CHLORIDE CRYS ER 20 MEQ PO TBCR
40.0000 meq | EXTENDED_RELEASE_TABLET | Freq: Once | ORAL | Status: AC
  Administered 2017-11-07: 40 meq via ORAL
  Filled 2017-11-07: qty 2

## 2017-11-07 MED ORDER — VENLAFAXINE HCL ER 75 MG PO CP24
225.0000 mg | ORAL_CAPSULE | Freq: Every day | ORAL | Status: DC
  Administered 2017-11-07 – 2017-11-09 (×3): 225 mg via ORAL
  Filled 2017-11-07 (×4): qty 3

## 2017-11-07 NOTE — Plan of Care (Signed)
  Education: Knowledge of General Education information will improve 11/07/2017 0214 - Progressing by Theador Hawthorne, RN   Activity: Risk for activity intolerance will decrease 11/07/2017 0214 - Progressing by Theador Hawthorne, RN   Pain Managment: General experience of comfort will improve 11/07/2017 0214 - Progressing by Theador Hawthorne, RN   Safety: Ability to remain free from injury will improve 11/07/2017 0214 - Progressing by Theador Hawthorne, RN   Skin Integrity: Risk for impaired skin integrity will decrease 11/07/2017 0214 - Progressing by Theador Hawthorne, RN

## 2017-11-07 NOTE — NC FL2 (Addendum)
[place] LEVEL OF CARE SCREENING TOOL     IDENTIFICATION  Patient Name: Raven Gray Birthdate: 1927/12/11 Sex: female Admission Date (Current Location): 11/06/2017  [facility] and [place] Number:  Herbalist and Address:  [facility], [place] [address] [place], [place] [address]      Provider Number: [id number]  Attending Physician Name and Address:  Elwin Mocha, MD  Relative Name and Phone Number:       Current Level of Care: Hospital Recommended Level of Care: [place] with palliative. Prior Approval Number:    Date Approved/Denied:   PASRR Number: [id number]  Expires 3/20  Discharge Plan: SNF with palliative.    Current Diagnoses: Patient Active Problem List   Diagnosis Date Noted  . Palliative care by specialist   . Epistaxis   . Syncope 11/02/2017  . Atrial fibrillation ([place]) 11/02/2017  . Nasal fracture 11/02/2017  . Fall at home 11/02/2017  . Hypertension 11/02/2017  . COPD with emphysema ([place]) 11/02/2017  . HLD (hyperlipidemia) 11/02/2017  . History of pulmonary embolism 07/21/2017  . Nonsustained ventricular tachycardia ([place]) 07/21/2017  . Healthcare-associated pneumonia 07/01/2017  . Acute respiratory failure with hypoxia ([place]) 07/01/2017  . Acute diastolic heart failure ([place]) 07/01/2017  . Conjunctivitis 07/01/2017  . Sepsis ([place]) 07/01/2017  . HCAP (healthcare-associated pneumonia)   . Acute pulmonary edema (H[facility])   . Tachycardia-bradycardia syndrome ([place]) 04/01/2017  . Pacemaker 04/01/2017  . Chronic atrial fibrillation ([place])   . Syncope and collapse   . Loss of consciousness ([place]) 01/08/2017  . Dehydration 11/25/2016  . Acute encephalopathy   . Encephalopathy 11/24/2016  . Dyspnea on exertion 10/29/2016  . Chronic respiratory failure with hypoxia ([place]) 10/29/2016  . Long term current use of anticoagulant 10/23/2016  . TIA (transient ischemic attack) 09/06/2016  . COPD (chronic  obstructive pulmonary disease) ([place]) 09/06/2016  . Essential hypertension 09/06/2016  . GERD (gastroesophageal reflux disease) 09/06/2016  . Depression 09/06/2016  . Hyperlipidemia 10/23/2015  . Aortic insufficiency 12/15/2014  . Edema of both legs 09/19/2014  . Atrial fibrillation with slow ventricular response ([place]) 11/21/2013  . Chest pain 11/21/2013  . Community acquired pneumonia 06/19/2012  . Pulmonary embolism ([place]) 06/19/2012  . Hypokalemia 06/18/2012  . COPD with acute exacerbation ([place]) 06/18/2012  . Hypoxia 06/18/2012    Orientation RESPIRATION BLADDER Height & Weight     Self, Time, Situation, Place  Normal Incontinent, External catheter Weight: 164 lb (74.4 kg) Height:  5\' 1"  (154.9 cm)  BEHAVIORAL SYMPTOMS/MOOD NEUROLOGICAL BOWEL NUTRITION STATUS  Other (Comment)(None) (None) Continent Diet(Regular)  AMBULATORY STATUS COMMUNICATION OF NEEDS Skin   Extensive Assist Verbally Skin abrasions, Bruising, Other (Comment)(Blister.)                       Personal Care Assistance Level of Assistance              Functional Limitations Info  Sight, Hearing, Speech Sight Info: Adequate Hearing Info: Adequate Speech Info: Adequate    SPECIAL CARE FACTORS FREQUENCY  Blood pressure, PT (By licensed PT)     PT Frequency: 5 x week              Contractures Contractures Info: Not present    Additional Factors Info  Code Status, Allergies, Psychotropic Code Status Info: DNR Allergies Info: NKDA Psychotropic Info: Depression: Effexor XR 225 mg PO daily with breakfast, Trazodone 25 mg PO QHS prn.  Current Medications (11/07/2017):  This is the current hospital active medication list Current Facility-Administered Medications  Medication Dose Route Frequency Provider Last Rate Last Dose  . acetaminophen (TYLENOL) tablet 650 mg  650 mg Oral Q6H PRN Radene Gunning, NP       Or  . acetaminophen (TYLENOL) suppository 650 mg  650 mg Rectal Q6H PRN  Radene Gunning, NP      . cephALEXin (KEFLEX) capsule 500 mg  500 mg Oral BID Radene Gunning, NP   500 mg at 11/07/17 0913  . metoprolol tartrate (LOPRESSOR) tablet 25 mg  25 mg Oral BID Radene Gunning, NP   25 mg at 11/07/17 0913  . ondansetron (ZOFRAN) tablet 4 mg  4 mg Oral Q6H PRN Radene Gunning, NP       Or  . ondansetron [facility]) injection 4 mg  4 mg Intravenous Q6H PRN Black, Karen M, NP      . pantoprazole (PROTONIX) EC tablet 40 mg  40 mg Oral Daily Radene Gunning, NP   40 mg at 11/07/17 0913  . pravastatin (PRAVACHOL) tablet 40 mg  40 mg Oral q1800 Radene Gunning, NP   40 mg at 11/06/17 1704  . senna-docusate (Senokot-S) tablet 1 tablet  1 tablet Oral QHS PRN Black, Lezlie Octave, NP      . sodium chloride (OCEAN) 0.65 % nasal spray 2 spray  2 spray Each Nare PRN Jodi Marble, MD      . sodium chloride flush (NS) 0.9 % injection 3 mL  3 mL Intravenous Q12H Black, Lezlie Octave, NP      . traZODone (DESYREL) tablet 25 mg  25 mg Oral QHS PRN Radene Gunning, NP   25 mg at 11/06/17 2123  . venlafaxine XR (EFFEXOR-XR) 24 hr capsule 225 mg  225 mg Oral Q breakfast Purohit, Konrad Dolores, MD   225 mg at 11/07/17 0612     Discharge Medications: Please see discharge summary for a list of discharge medications.  Relevant Imaging Results:  Relevant Lab Results:   Additional Information SS#: [id number]  Candie Chroman, LCSW

## 2017-11-07 NOTE — Clinical Social Work Placement (Signed)
   CLINICAL SOCIAL WORK PLACEMENT  NOTE  Date:  11/07/2017  Patient Details  Name: Raven Gray MRN: [id number] Date of Birth: Jan 30, 1928  Clinical Social Work is seeking post-discharge placement for this patient at the [place] level of care (*CSW will initial, date and re-position this form in  chart as items are completed):  Yes   Patient/family provided with [facility] Work Department's list of facilities offering this level of care within the geographic area requested by the patient (or if unable, by the patient's family).  Yes   Patient/family informed of their freedom to choose among providers that offer the needed level of care, that participate in Medicare, Medicaid or managed care program needed by the patient, have an available bed and are willing to accept the patient.  Yes   Patient/family informed of [facility]'s ownership interest in [facility] and [facility], as well as of the fact that they are under no obligation to receive care at these facilities.  PASRR submitted to EDS on 11/07/17     PASRR number received on       Existing PASRR number confirmed on       FL2 transmitted to all facilities in geographic area requested by pt/family on 11/07/17     FL2 transmitted to all facilities within larger geographic area on       Patient informed that his/her managed care company has contracts with or will negotiate with certain facilities, including the following:            Patient/family informed of bed offers received.  Patient chooses bed at       Physician recommends and patient chooses bed at      Patient to be transferred to   on  .  Patient to be transferred to facility by       Patient family notified on   of transfer.  Name of family member notified:        PHYSICIAN Please sign FL2     Additional Comment:    _______________________________________________ Candie Chroman, LCSW 11/07/2017, 4:54 PM

## 2017-11-07 NOTE — Progress Notes (Signed)
[facility] PROGRESS NOTE  Raven Gray ALP:[id number] DOB: [birth year] DOA: 11/06/2017 PCP: [name], MD  Assessment/Plan:  #1. Syncope and collapse. Likely related to orthostatics in the setting of dehydration due to decreased oral intake, Lasix. Also with a history of A. fib with pauses status post pacemaker. Chart review indicates 7 falls in 6 months. CT of the head of acute intracranial abnormality. CT angio chest no PE> Unable to obtain orthostatics due to family resistance. Pacer interrogated Medications also include Xanax and Ambien -Admit to telemetry -Obtain 2-D echo--> pending -PT-->SNF/OT-->P -Hold Lasix, start tomorrow -palliative care for goals of care-->P  #2. Atrial fibrillation. Currently rate controlled. Status post pacemaker chadscore 6. EKG as noted above. CT NGO of the chest as noted above Evaluated by cardiology last hospitalization who opined ongoing anticoagulation too high of a risk given her frequent falls and Xarelto was discontinued -Continue home beta blocker -monitor  #3. Hypertension. The pressures the high end of normal in the emergency department. Home medications include Lasix and metoprolol. Is given intravenous Lasix in the emergency department. Rest recent echo April 2018 reveals moderate LVH with an EF of 55% mild MR, mild AR -Continue beta blocker -Lasix for now  #4. COPD with emphysema. Appears stable at baselin  #5. Chronic respiratory failure with hypoxia. Related to COPD. Appears stable at baseline -continue home med  #6. Nasal fracture. He had a fall 3 days ago. EDP at that time placed nasal tampon to left nare. Scheduled to be removed today by ear nose and throat. -EDP called ENT--> packing removed, pt can f/u prn     DVT prophylaxis: scd  Code Status: dnr  Family Communication: daughter and granddaugter  Disposition Plan: needs placement  Consults called: none  Admission status: obs       Consultants:  ENT - Dr.  Erik Obey  Procedures:  none  Antibiotics:  --- (indicate start date, and stop date if known)  HPI/Subjective: Denies complaints.  Objective: Vitals:   11/07/17 0403 11/07/17 0913  BP: (!) 176/96 140/63  Pulse: 69 78  Resp: 20   Temp: 98.3 F (36.8 C)   SpO2: 92%     Intake/Output Summary (Last 24 hours) at 11/07/2017 1435 Last data filed at 11/07/2017 0600 Gross per 24 hour  Intake 620 ml  Output -  Net 620 ml   Filed Weights   11/06/17 0955  Weight: 74.4 kg (164 lb)    Exam:   General:  NAD, bruising to R side of heard  Cardiovascular: RRR, no MRG  Respiratory: CTAB, nl wob  Abdomen: ND, NT, BS+  Musculoskeletal: moving all extr   Data Reviewed: Basic Metabolic Panel: Recent Labs  Lab 11/02/17 1127 11/02/17 1636 11/03/17 0442 11/04/17 0447 11/06/17 1009 11/07/17 0542  NA 140  --  141 139 140 138  K 4.2  --  3.9 3.1* 3.6 3.3*  CL 104  --  102 106 103 103  CO2 25  --  26 22 23 24   GLUCOSE 117*  --  128* 152* 121* 116*  BUN 17  --  15 9 14 17   CREATININE 0.88  --  0.87 0.82 1.00 0.87  CALCIUM 9.0  --  9.0 8.5* 8.8* 8.4*  MG  --  2.0  --   --  1.8  --    Liver Function Tests: Recent Labs  Lab 11/02/17 1127 11/06/17 1009  AST 23 20  ALT 15 8*  ALKPHOS 100 75  BILITOT 0.9 1.3*  PROT 6.9 6.2*  ALBUMIN 3.7 2.9*   No results for input(s): LIPASE, AMYLASE in the last 168 hours. No results for input(s): AMMONIA in the last 168 hours. CBC: Recent Labs  Lab 11/02/17 1127 11/03/17 0442 11/04/17 0447 11/06/17 1009 11/07/17 0542  WBC 8.8 8.6 7.5 10.6* 9.0  NEUTROABS 5.8  --   --  7.5  --   HGB 14.8 13.7 13.3 13.4 11.9*  HCT 48.2* 44.2 43.0 42.8 38.4  MCV 88.9 88.0 88.1 88.2 87.5  PLT 165 164 149* 156 171   Cardiac Enzymes: Recent Labs  Lab 11/02/17 1127 11/06/17 1009  CKTOTAL  --  113  TROPONINI <0.03  --    BNP (last 3 results) Recent Labs    07/01/17 0825 11/06/17 0946  BNP 555.2* 450.1*    ProBNP (last 3 results) No  results for input(s): PROBNP in the last 8760 hours.  CBG: Recent Labs  Lab 11/03/17 0653 11/07/17 0611  GLUCAP 103* 117*    No results found for this or any previous visit (from the past 240 hour(s)).   Studies: Dg Chest 2 View  Result Date: 11/06/2017 CLINICAL DATA:  Syncopal episode EXAM: CHEST  2 VIEW COMPARISON:  11/02/2017 FINDINGS: The lungs are hyperinflated likely secondary to COPD. There is no focal parenchymal opacity. There is no pleural effusion or pneumothorax. There is stable cardiomegaly. There is thoracic aortic atherosclerosis. There is a single lead cardiac pacemaker. There is mild osteoarthritis of bilateral glenohumeral joints. IMPRESSION: No active cardiopulmonary disease. Electronically Signed   By: Kathreen Devoid   On: 11/06/2017 11:10   Ct Head Wo Contrast  Result Date: 11/06/2017 CLINICAL DATA:  Syncope recurrent.  Recent fall with head injury EXAM: CT HEAD WITHOUT CONTRAST TECHNIQUE: Contiguous axial images were obtained from the base of the skull through the vertex without intravenous contrast. COMPARISON:  CT head 11/02/2017 FINDINGS: Brain: Moderate atrophy. Moderate chronic microvascular ischemic changes throughout the white matter. Small chronic infarct left occipital lobe unchanged. Negative for acute infarct. Negative for intracranial hemorrhage or mass. No midline shift. Vascular: Negative for hyperdense vessel Skull: Negative fracture of the nasal bone again noted. No other skull fracture. Sinuses/Orbits: Air-fluid levels in the sphenoid sinus remains. Improvement in air-fluid levels in the maxillary sinus bilaterally which have resolved. Mild mucosal edema in the paranasal sinuses. Bilateral cataract removal. Other: None IMPRESSION: Atrophy and chronic ischemic change. No acute intracranial abnormality Nasal bone fracture again noted with air-fluid level in the sphenoid sinus. Resolution of bilateral maxillary sinus air-fluid levels. Electronically Signed   By:  Franchot Gallo M.D.   On: 11/06/2017 11:03   Ct Angio Chest Pe W And/or Wo Contrast  Result Date: 11/06/2017 CLINICAL DATA:  Syncopal episode this morning. EXAM: CT ANGIOGRAPHY CHEST WITH CONTRAST TECHNIQUE: Multidetector CT imaging of the chest was performed using the standard protocol during bolus administration of intravenous contrast. Multiplanar CT image reconstructions and MIPs were obtained to evaluate the vascular anatomy. CONTRAST:  59mL ISOVUE-370 IOPAMIDOL (ISOVUE-370) INJECTION 76% COMPARISON:  Chest radiograph 11/06/2017 FINDINGS: Cardiovascular: Enlarged heart. No pericardial effusion. Calcific atherosclerotic disease of the aorta and coronary arteries. Cardiac pacemaker with appropriate lead position. No evidence of pulmonary embolus. Mediastinum/Nodes: No enlarged mediastinal, hilar, or axillary lymph nodes. Thyroid gland, trachea, and esophagus demonstrate no significant findings. Large hiatal hernia. Lungs/Pleura: Mild chronic interstitial lung changes. Two adjacent subpleural pulmonary nodules in the medial basilar portion the right lung, the larger measuring 8 mm. Image 75/105, sequence 8. Upper Abdomen: Left  nonobstructive renal calculi. Musculoskeletal: Scoliosis and findings of diffuse idiopathic skeletal hyperostosis of the thoracic spine. Review of the MIP images confirms the above findings. IMPRESSION: No evidence of pulmonary embolus. Mild fusiform dilation of the ascending aorta measuring 4.3 cm in maximum diameter. Recommend annual imaging followup by CTA or MRA. This recommendation follows 2010 ACCF/AHA/AATS/ACR/ASA/SCA/SCAI/SIR/STS/SVM Guidelines for the Diagnosis and Management of Patients with Thoracic Aortic Disease. Circulation. 2010; 121: W098-J191 Right-sided pulmonary nodules, the larger measuring 8 mm. Non-contrast chest CT at 3-6 months is recommended. If the nodules are stable at time of repeat CT, then future CT at 18-24 months (from today's scan) is considered optional  for low-risk patients, but is recommended for high-risk patients. This recommendation follows the consensus statement: Guidelines for Management of Incidental Pulmonary Nodules Detected on CT Images: From the Fleischner Society 2017; Radiology 2017; 284:228-243. Enlarged heart. Aortic Atherosclerosis (ICD10-I70.0). Electronically Signed   By: Fidela Salisbury M.D.   On: 11/06/2017 14:41   Ct Lumbar Spine Wo Contrast  Result Date: 11/06/2017 CLINICAL DATA:  Syncope.  Back pain. EXAM: CT LUMBAR SPINE WITHOUT CONTRAST TECHNIQUE: Multidetector CT imaging of the lumbar spine was performed without intravenous contrast administration. Multiplanar CT image reconstructions were also generated. COMPARISON:  CT lumbar spine 10/07/2017 FINDINGS: Segmentation: Normal segmentation. Moderate dextroscoliosis at L2-3 is unchanged. Alignment: Mild retrolisthesis L1-2, L2-3, L3-4 unchanged. Vertebrae: Negative for acute fracture. Superior endplate deformity at L2 is unchanged and appears chronic. Paraspinal and other soft tissues: Calcific atherosclerotic disease in the aorta. Bilateral nonobstructing renal calculi. 3 cm right upper pole cyst unchanged. No retroperitoneal adenopathy Disc levels: T12-L1: Moderate disc degeneration and spurring without significant stenosis L1-2: Mild disc and facet degeneration without significant stenosis L2-3: Disc degeneration and spurring left greater than right. Retrolisthesis. Moderate left foraminal encroachment due to spurring. Subarticular stenosis on the left due to spurring. Spinal canal adequate in size. L3-4: Disc degeneration and spurring, left greater than right. Moderate facet hypertrophy bilaterally. Mild spinal stenosis. Moderate subarticular and foraminal stenosis on the left. L4-5: Interspinous fusion device unchanged in position. Posterior bony fusion. Marked facet hypertrophy. Moderate spinal stenosis unchanged. Right subarticular and foraminal stenosis due to spurring  unchanged. L5-S1: Disc degeneration and spurring, right greater than left. Moderate right foraminal encroachment due to spurring unchanged. Bilateral facet hypertrophy contributes to foraminal stenosis. IMPRESSION: Negative for acute fracture Scoliosis and multilevel degenerative changes similar to the recent CT of 10/07/2017. Electronically Signed   By: Franchot Gallo M.D.   On: 11/06/2017 14:46   Dg Knee Complete 4 Views Left  Addendum Date: 11/06/2017   ADDENDUM REPORT: 11/06/2017 20:22 ADDENDUM: In retrospect, there is a small step-off in the medial femoral condyle. This would be an unusual traumatic fracture and could represent a small insufficiency fracture of uncertain age. If the patient has pain medially, an MRI could better assess. These results will be called to the ordering clinician or representative by the Radiologist Assistant, and communication documented in the PACS or zVision Dashboard. Electronically Signed   By: Dorise Bullion III M.D   On: 11/06/2017 20:22   Result Date: 11/06/2017 CLINICAL DATA:  Fall 1 week ago with anterior pain. EXAM: LEFT KNEE - COMPLETE 4+ VIEW COMPARISON:  None. FINDINGS: Severe degenerative changes in the medial compartment with near complete loss of joint space. No significant joint effusion. Anterior soft tissue swelling identified. No convincing evidence of fracture. IMPRESSION: Anterior soft tissue swelling. No joint effusion. No obvious fracture. Severe degenerative changes. Electronically Signed: By: Shanon Brow  Mee Hives M.D On: 11/06/2017 19:27   Dg Hips Bilat W Or Wo Pelvis 5 Views  Result Date: 11/06/2017 CLINICAL DATA:  Pain.  Recent fall EXAM: DG HIP (WITH OR WITHOUT PELVIS) 5+V BILAT COMPARISON:  None. FINDINGS: Frontal pelvis as well as frontal and lateral hips bilaterally-total five views-obtained. No evident acute fracture or dislocation. There is mild symmetric narrowing of both hip joints. No erosive change. Sacroiliac joints appear symmetric  bilaterally. Bones are osteoporotic. IMPRESSION: Bones osteoporotic. No acute fracture or dislocation. Mild symmetric narrowing both hip joints. Electronically Signed   By: Lowella Grip III M.D.   On: 11/06/2017 13:31   Vas [place] Lower Extremity Venous (dvt) (only [facility])  Result Date: 11/06/2017  Lower Venous Study Indication: Edema. Examination Guidelines: A complete evaluation includes B-mode imaging, spectral doppler, color doppler, and power doppler as needed of all accessible portions of each vessel. Bilateral testing is considered an integral part of a complete examination. Limited examinations for reoccurring indications may be performed as noted. The reflux portion of the exam is performed with the patient in reverse Trendelenburg.  Right Venous Findings: +---------+---------------+---------+-----------+----------+-------+          CompressibilityPhasicitySpontaneityPropertiesSummary +---------+---------------+---------+-----------+----------+-------+ CFV      Full           Yes      Yes                          +---------+---------------+---------+-----------+----------+-------+ FV Prox  Full                                                 +---------+---------------+---------+-----------+----------+-------+ FV Mid   Full                                                 +---------+---------------+---------+-----------+----------+-------+ FV DistalFull                                                 +---------+---------------+---------+-----------+----------+-------+ PFV      Full                                                 +---------+---------------+---------+-----------+----------+-------+ POP      Full           Yes      Yes                          +---------+---------------+---------+-----------+----------+-------+ PTV      Full                                                  +---------+---------------+---------+-----------+----------+-------+ PERO     Full                                                 +---------+---------------+---------+-----------+----------+-------+  Left Venous Findings: +---------+---------------+---------+-----------+----------+-------+          CompressibilityPhasicitySpontaneityPropertiesSummary +---------+---------------+---------+-----------+----------+-------+ CFV      Full           Yes      Yes                          +---------+---------------+---------+-----------+----------+-------+ FV Prox  Full                                                 +---------+---------------+---------+-----------+----------+-------+ FV Mid   Full                                                 +---------+---------------+---------+-----------+----------+-------+ FV DistalFull                                                 +---------+---------------+---------+-----------+----------+-------+ PFV      Full                                                 +---------+---------------+---------+-----------+----------+-------+ POP      Full           Yes      Yes                          +---------+---------------+---------+-----------+----------+-------+ PTV      Full                                                 +---------+---------------+---------+-----------+----------+-------+ PERO     Full                                                 +---------+---------------+---------+-----------+----------+-------+    Final Interpretation: Right: There is no evidence of deep vein thrombosis in the lower extremity. No cystic structure found in the popliteal fossa. Left: There is no evidence of deep vein thrombosis in the lower extremity. A cystic structure is found in the popliteal fossa.  *See table(s) above for measurements and observations. Electronically signed by Deitra Mayo on 11/06/2017 at 3:29:56 PM.    Final    Scheduled Meds: . cephALEXin  500 mg Oral BID  . metoprolol tartrate  25 mg Oral BID  . pantoprazole  40 mg Oral Daily  . pravastatin  40 mg Oral q1800  . sodium chloride flush  3 mL Intravenous Q12H  . venlafaxine XR  225 mg Oral Q breakfast   Continuous Infusions: . sodium chloride 50 mL/hr at 11/06/17 1935    Principal Problem:   Syncope and collapse Active Problems:   Aortic insufficiency   COPD (chronic obstructive  pulmonary disease) ([place])   Essential hypertension   GERD (gastroesophageal reflux disease)   Dyspnea on exertion   Chronic respiratory failure with hypoxia ([facility])   Syncope   Atrial fibrillation ([place])   Nasal fracture   Hypertension    Time spent: 72min    Phillip M Hobbs  [facility] Pager AMION. If 7PM-7AM, please contact night-coverage at [email or web address], password [facility] 11/07/2017, 2:35 PM  LOS: 0 days

## 2017-11-07 NOTE — Evaluation (Signed)
Physical Therapy Evaluation Patient Details Name: Raven Gray MRN: [id number] DOB: February 27, 1928 Today's Date: 11/07/2017   History of Present Illness  Pt is an 84 y/o female admitted secondary to sustaining a syncopal episode at home, likely due to orthostatics in the setting of dehydration. Of note, patient was recently hospitalized on 11/02/17 and discharged on 11/04/2017 with syncopal event.  Workup during that time was unremarkable. X-Ray of L knee revealed small step-off in the medial femoral condyle which could "represent a small insufficiency fracture of uncertain age" (recommended MRI for further investigation). PMH including but not limited to CVA, a-fib, COPD, HTN, tachy-brady syndrome s/p pacemaker placement in 2018.    Clinical Impression  Pt presented supine in bed with HOB elevated, awake and willing to participate in therapy session. Prior to admission, pt reported that she ambulates with either a SPC or RW and was independent with ADLs. Pt lives alone but has family close by to assist if needed. Pt admitted to several recent falls. She currently requires min A for bed mobility and can sit EOB with supervision without any UE supports. Pt would continue to benefit from skilled physical therapy services at this time while admitted and after d/c to address the below listed limitations in order to improve overall safety and independence with functional mobility.  Evaluation limited, ortho has not seen pt for clarification re: WB'ing status L LE (x-ray demonstrated small step-off in the medial femoral condyle which could "represent a small insufficiency fracture of uncertain age").      Follow Up Recommendations SNF;Supervision/Assistance - 24 hour    Equipment Recommendations  None recommended by PT    Recommendations for Other Services       Precautions / Restrictions Precautions Precautions: Fall Precaution Comments: history of recent falls Restrictions Weight Bearing Restrictions:  No      Mobility  Bed Mobility Overal bed mobility: Needs Assistance Bed Mobility: Supine to Sit;Sit to Supine     Supine to sit: Min assist Sit to supine: Min guard   General bed mobility comments: increased time and effort, use of bed rails, assist to elevate trunk; pt able to scoot up in bed independently for repositioning  Transfers                 General transfer comment: deferred at this time as Ortho has not seen pt for clarification re: WB'ing status L LE (x-ray demonstrated small step-off in the medial femoral condyle which could "represent a small insufficiency fracture of uncertain age")  Ambulation/Gait                Stairs            Wheelchair Mobility    Modified Rankin (Stroke Patients Only)       Balance Overall balance assessment: Needs assistance;History of Falls Sitting-balance support: Feet supported Sitting balance-Leahy Scale: Good Sitting balance - Comments: pt tolerated sitting EOB ~15 mins with supervision while eating breakfast                                     Pertinent Vitals/Pain Pain Assessment: No/denies pain    Home Living Family/patient expects to be discharged to:: Private residence Living Arrangements: Alone Available Help at Discharge: Family;Available PRN/intermittently Type of Home: House Home Access: Level entry     Home Layout: One level Home Equipment: Clinical cytogeneticist - 2 wheels;Cane - single point;Bedside commode;Wheelchair -  manual      Prior Function Level of Independence: Independent with assistive device(s)         Comments: ambulates with either SPC or RW     Hand Dominance   Dominant Hand: Right    Extremity/Trunk Assessment   Upper Extremity Assessment Upper Extremity Assessment: RUE deficits/detail RUE Deficits / Details: hx of rotator cuff surgeries, limited AROM at shoulder; able to feed herself with R UE    Lower Extremity Assessment Lower Extremity  Assessment: Generalized weakness;LLE deficits/detail(bilateral knees equally painful) LLE Deficits / Details: edema noted around L knee       Communication   Communication: No difficulties  Cognition Arousal/Alertness: Awake/alert Behavior During Therapy: WFL for tasks assessed/performed Overall Cognitive Status: Within Functional Limits for tasks assessed                                        General Comments      Exercises     Assessment/Plan    PT Assessment Patient needs continued PT services  PT Problem List Decreased balance;Decreased mobility;Decreased coordination;Decreased knowledge of use of DME;Decreased safety awareness;Decreased knowledge of precautions;Pain       PT Treatment Interventions DME instruction;Gait training;Stair training;Functional mobility training;Therapeutic exercise;Therapeutic activities;Balance training;Neuromuscular re-education;Patient/family education    PT Goals (Current goals can be found in the Care Plan section)  Acute Rehab PT Goals Patient Stated Goal: go to rehab for ST therapy prior to returning home PT Goal Formulation: With patient Time For Goal Achievement: 11/21/17 Potential to Achieve Goals: Good    Frequency Min 2X/week   Barriers to discharge Decreased caregiver support      Co-evaluation               AM-PAC PT "6 Clicks" Daily Activity  Outcome Measure Difficulty turning over in bed (including adjusting bedclothes, sheets and blankets)?: A Little Difficulty moving from lying on back to sitting on the side of the bed? : Unable Difficulty sitting down on and standing up from a chair with arms (e.g., wheelchair, bedside commode, etc,.)?: Unable Help needed moving to and from a bed to chair (including a wheelchair)?: A Little Help needed walking in hospital room?: A Little Help needed climbing 3-5 steps with a railing? : A Lot 6 Click Score: 13    End of Session   Activity Tolerance: Patient  tolerated treatment well Patient left: in bed;with call bell/phone within reach;with bed alarm set Nurse Communication: Mobility status;Other (comment)(need clarification re: WB'ing status L LE) PT Visit Diagnosis: Other abnormalities of gait and mobility (R26.89);History of falling (Z91.81)    Time: 9485-4627 PT Time Calculation (min) (ACUTE ONLY): 23 min   Charges:   PT Evaluation $PT Eval Moderate Complexity: 1 Mod PT Treatments $Therapeutic Activity: 8-22 mins   PT G Codes:        [place], PT, DPT [phone]   [place] 11/07/2017, 11:42 AM

## 2017-11-07 NOTE — Progress Notes (Signed)
  Echocardiogram 2D Echocardiogram has been performed.  Raven Gray T Curley [place] 11/07/2017, 5:17 PM

## 2017-11-07 NOTE — Consult Note (Signed)
Consultation Note Date: 11/07/2017   Patient Name: Raven Gray  DOB: 02-01-1928  MRN: [id number]  Age / Sex: 84 y.o., female  PCP: [name], MD Referring Physician: Elwin Mocha, MD  Reason for Consultation: Establishing goals of care and Psychosocial/spiritual support  HPI/Patient Profile: 84 y.o. female  with past medical history of A. fib (now off anticoagulation secondary to multiple falls over the past year), tachybradycardia syndrome status post pacemaker, CVA, hypertension, hyperlipidemia, COPD, diabetes, admitted on 11/06/2017 after syncopal event.  Patient was just admitted   to the hospital from 2/11 to 11/04/2017 after a fall where she sustained a nasal fracture  Consult ordered for goals of care  Clinical Assessment and Goals of Care: Met with patient, chart reviewed.  Met with patient's 3 daughters, granddaughter as well as son-in-law.  Introduced palliative medicine service as an additional resource and source of support for patient and her family while she is in the hospital.  Provided patient and family clinical information and engaged in life review, goals and values.  Patient has been living alone in a home that she has lived in for the past 55 years.  This is a quality of life issue to maintain her independence as long as possible.  During her previous hospitalization as noted above, she refused skilled nursing facility and was discharged back home with family support.  She is struggling with her sharp decline that she has been experiencing over the past 6-9 months.  He becomes tearful when she talks about this.  Patient's healthcare proxy is her daughter, Aretha Parrot [phone].  Patient has 2 other daughters and they are making decisions together as a family.  Patient at this point is able to speak for herself    SUMMARY OF RECOMMENDATIONS   The patient is willing to go to skilled  nursing facility for rehab Order placed to social work to help facilitate this Notified social worker that patient was admitted under observation status and queried whether if she was not discharged in 24 hours if she would sustain additional charges while we are waiting to help facilitate rehab admission Change code status in the computer to DNR Family given MOST forms, as well as [employer] Choices for [employer] booklet  Per chart review, under diagnostics,  patient has sustained a small insufficiency fracture to the left knee.  Would orthopedic input be helpful in terms of improving patient's functional status?  Patient is avoiding any weightbearing on her left leg  Code Status/Advance Care Planning:  DNR    Palliative Prophylaxis:   Aspiration, Bowel Regimen, Delirium Protocol, Eye Care, Frequent Pain Assessment, Oral Care and Turn Reposition  Additional Recommendations (Limitations, Scope, Preferences): Full Scope Treatment except for DNR/DNI Psycho-social/Spiritual:   Desire for further Chaplaincy support:no  Additional Recommendations: Referral to Community Resources   Prognosis:   Unable to determine  Discharge Planning: [place] for rehab with Palliative care service follow-up      Primary Diagnoses: Present on Admission: . Aortic insufficiency . Atrial fibrillation (  [place]) . Chronic respiratory failure with hypoxia ([place]) . COPD (chronic obstructive pulmonary disease) ([place]) . Dyspnea on exertion . Essential hypertension . GERD (gastroesophageal reflux disease) . Hypertension . Syncope and collapse . Syncope . Nasal fracture   I have reviewed the medical record, interviewed the patient and family, and examined the patient. The following aspects are pertinent.  Past Medical History:  Diagnosis Date  . Atrial fibrillation ([place])    a. on Xarelto  . Chest pain   . COPD (chronic obstructive pulmonary disease) ([place])    family unsure if is copd,     . H/O echocardiogram    a. 08/2016: EF of 60-65%, severely dilated LA, mild pulmonic regurgitations, mild TR, and PA Pressure at 38 mm Hg  . History of depression   . History of DVT (deep vein thrombosis)   . History of pulmonary embolism   . Hyperlipidemia   . Hypertension   . Hypokalemia    Now improved with treatment  . Shortness of breath   . Stroke ([place])   . Tachy-brady syndrome ([place]) 04/01/2017   s/p MDT PPM    Social History   Socioeconomic History  . Marital status: Widowed    Spouse name: None  . Number of children: None  . Years of education: None  . Highest education level: None  Social Needs  . Financial resource strain: None  . Food insecurity - worry: None  . Food insecurity - inability: None  . Transportation needs - medical: None  . Transportation needs - non-medical: None  Occupational History  . None  Tobacco Use  . Smoking status: Never Smoker  . Smokeless tobacco: Never Used  Substance and Sexual Activity  . Alcohol use: No  . Drug use: No  . Sexual activity: No  Other Topics Concern  . None  Social History Narrative  . None   Family History  Problem Relation Age of Onset  . Alzheimer's disease Mother   . Heart attack Father   . Diabetes type II Daughter   . Heart disease Daughter        stents   Scheduled Meds: . cephALEXin  500 mg Oral BID  . metoprolol tartrate  25 mg Oral BID  . pantoprazole  40 mg Oral Daily  . pravastatin  40 mg Oral q1800  . sodium chloride flush  3 mL Intravenous Q12H  . venlafaxine XR  225 mg Oral Q breakfast   Continuous Infusions: . sodium chloride 50 mL/hr at 11/06/17 1935   PRN Meds:.acetaminophen **OR** acetaminophen, ondansetron **OR** ondansetron (ZOFRAN) IV, senna-docusate, sodium chloride, traZODone Medications Prior to Admission:  Prior to Admission medications   Medication Sig Start Date End Date Taking? Authorizing Provider  acetaminophen (TYLENOL) 325 MG tablet Take 650 mg by mouth every 6  (six) hours as needed (for pain).    Yes [provider]  ALPRAZolam (XANAX) 0.25 MG tablet Take 1 tablet (0.25 mg total) by mouth 2 (two) times daily as needed for anxiety. 10/21/14  Yes Mikhail, Velta Addison, DO  cephALEXin (KEFLEX) 500 MG capsule Take 1 capsule (500 mg total) by mouth 2 (two) times daily for 7 days. 11/04/17 11/11/17 Yes Nita Sells, MD  furosemide (LASIX) 40 MG tablet Take 1 tablet (40 mg total) by mouth every morning. 01/10/17  Yes Domenic Polite, MD  metoprolol (LOPRESSOR) 25 MG tablet Take 1 tablet (25 mg total) by mouth 2 (two) times daily. 06/20/14  Yes Lorretta Harp, MD  Multiple Vitamins-Minerals (WOMENS 50+  MULTI VITAMIN/MIN) TABS Take 1 tablet by mouth daily.   Yes [provider]  omeprazole (PRILOSEC) 20 MG capsule Take 20 mg by mouth daily as needed (for reflux/heartburn).  04/23/15  Yes [provider]  OXYGEN Inhale 2 L into the lungs continuous.    Yes [provider]  potassium chloride SA (K-DUR,KLOR-CON) 20 MEQ tablet Take 40 mEq by mouth daily.  01/13/17  Yes Isaiah Serge, NP  pravastatin (PRAVACHOL) 40 MG tablet Take 40 mg by mouth every morning.    Yes [provider]  venlafaxine XR (EFFEXOR-XR) 150 MG 24 hr capsule Take 150 mg by mouth daily.   Yes [provider]  venlafaxine XR (EFFEXOR-XR) 75 MG 24 hr capsule Take 1 capsule by mouth daily. 07/14/17  Yes [provider]  zolpidem (AMBIEN) 5 MG tablet Take 5 mg by mouth at bedtime. 11/03/13  Yes [provider]  nitroGLYCERIN (NITROSTAT) 0.4 MG SL tablet Place 0.4 mg under the tongue every 5 (five) minutes as needed for chest pain. X 3 doses 02/08/16   [provider]   No Known Allergies Review of Systems  Unable to perform ROS: Other    Physical Exam  Constitutional: She is oriented to person, place, and time.  Frail elderly female, no acute distress  HENT:  Head: Normocephalic.  Facial bruising  Neck: Normal  range of motion.  Cardiovascular: Normal rate.  Pulmonary/Chest: Effort normal.  Abdominal: Soft.  Neurological: She is alert and oriented to person, place, and time.  Decreased proprioception noted to bilateral lower extremities, left greater than right Market decrease in sensation noted to the left foot  Skin: Skin is warm and dry. There is pallor.  Psychiatric: Her behavior is normal. Judgment and thought content normal.  Nursing note and vitals reviewed.   Vital Signs: BP (!) 144/62 (BP Location: Left Arm)   Pulse 72   Temp 98.9 F (37.2 C) (Oral)   Resp 20   Ht _0  (1.549 m)   Wt 74.4 kg (164 lb)   SpO2 97%   BMI 30.99 kg/m  Pain Assessment: No/denies pain   Pain Score: Asleep   SpO2: SpO2: 97 % O2 Device:SpO2: 97 % O2 Flow Rate: .   IO: Intake/output summary:   Intake/Output Summary (Last 24 hours) at 11/07/2017 1508 Last data filed at 11/07/2017 0600 Gross per 24 hour  Intake 620 ml  Output -  Net 620 ml    LBM: Last BM Date: 11/07/17 Baseline Weight: Weight: 74.4 kg (164 lb) Most recent weight: Weight: 74.4 kg (164 lb)     Palliative Assessment/Data:   Flowsheet Rows     Most Recent Value  Intake Tab  Referral Department  Hospitalist  Unit at Time of Referral  Med/Surg Unit  Palliative Care Primary Diagnosis  Cardiac  Date Notified  11/06/17  Palliative Care Type  New Palliative care  Reason for referral  Clarify Goals of Care  Date of Admission  11/06/17  Date first seen by Palliative Care  11/07/17  # of days Palliative referral response time  1 Day(s)  # of days IP prior to Palliative referral  0  Clinical Assessment  Palliative Performance Scale Score  40%  Pain Max last 24 hours  Not able to report  Pain Min Last 24 hours  Not able to report  Dyspnea Max Last 24 Hours  Not able to report  Dyspnea Min Last 24 hours  Not able to report  Nausea Max  Last 24 Hours  Not able to report  Nausea Min Last 24 Hours  Not able to report  Anxiety Max  Last 24 Hours  Not able to report  Anxiety Min Last 24 Hours  Not able to report  Other Max Last 24 Hours  Not able to report  Psychosocial & Spiritual Assessment  Palliative Care Outcomes  Patient/Family meeting held?  Yes  Who was at the meeting?  pt, 3 daughters  Palliative Care Outcomes  Changed CPR status, Provided psychosocial or spiritual support  Patient/Family wishes: Interventions discontinued/not started   Mechanical Ventilation, Trach  Palliative Care follow-up planned  No      Time In: 1330 Time Out: 1440 Time Total: 70 min Greater than 50%  of this time was spent counseling and coordinating care related to the above assessment and plan.  Signed by: Dory Horn, NP   Please contact Palliative Medicine Team phone at [phone] for questions and concerns.  For individual provider: See Shea Evans

## 2017-11-07 NOTE — Progress Notes (Signed)
Radiology assistant called RN with results of patient's knee x-ray. [facility] on-call APP paged and notified. Patient is not complaining  of any pain at this time.

## 2017-11-07 NOTE — Clinical Social Work Note (Signed)
Clinical Social Work Assessment  Patient Details  Name: Raven Gray MRN: [id number] Date of Birth: 05/01/1928  Date of referral:  11/07/17               Reason for consult:  Facility Placement, Discharge Planning                Permission sought to share information with:  Facility Art therapist granted to share information::  Yes, Verbal Permission Granted  Name::     Aretha Parrot  Agency::  SNF's  Relationship::  Daughter  Contact Information:  [phone]  Housing/Transportation Living arrangements for the past 2 months:  [place] of Information:  Patient, Medical Team, Adult Children, Other (Comment Required)(Multiple family members) Patient Interpreter Needed:  None Criminal Activity/Legal Involvement Pertinent to Current Situation/Hospitalization:  No - Comment as needed Significant Relationships:  Adult Children, Other Family Members Lives with:  Self Do you feel safe going back to the place where you live?  Yes Need for family participation in patient care:  Yes (Comment)  Care giving concerns:  PT recommending SNF once medically stable for discharge.   Social Worker assessment / plan:  CSW met with patient. Multiple family members at bedside. CSW introduced role and explained that PT recommendations would be discussed. Patient and her family agreeable to SNF placement. First preference is [facility]. CSW sent referral and left message for admissions coordinator to notify. PASARR under manual review. Patient cannot go to SNF until PASARR obtained. No further concerns. CSW encouraged patient and her family to contact CSW as needed. CSW will continue to follow patient and her family for support and facilitate discharge to SNF once medically stable.  Employment status:  Retired Nurse, adult PT Recommendations:  [place] / Referral to community resources:  [place]  Patient/Family's Response to care:  Patient and her family agreeable to SNF placement. Patient's family supportive and involved in patient's care. Patient and her family appreciated social work intervention.  Patient/Family's Understanding of and Emotional Response to Diagnosis, Current Treatment, and Prognosis:  Patient and her family have a good understanding of the reason for admission and her need for rehab prior to returning home. Patient and her family appear happy with hospital care.  Emotional Assessment Appearance:  Appears stated age Attitude/Demeanor/Rapport:  Engaged, Gracious Affect (typically observed):  Accepting, Appropriate, Calm, Pleasant Orientation:  Oriented to Self, Oriented to Place, Oriented to  Time, Oriented to Situation Alcohol / Substance use:  Never Used Psych involvement (Current and /or in the community):  No (Comment)  Discharge Needs  Concerns to be addressed:  Care Coordination Readmission within the last 30 days:  Yes Current discharge risk:  Dependent with Mobility, Lives alone Barriers to Discharge:  Ship broker, Continued Medical Work up, Programmer, applications ([place])   Candie Chroman, LCSW 11/07/2017, 4:51 PM

## 2017-11-08 ENCOUNTER — Observation Stay (HOSPITAL_COMMUNITY): Payer: Medicare Other

## 2017-11-08 DIAGNOSIS — R55 Syncope and collapse: Secondary | ICD-10-CM | POA: Diagnosis not present

## 2017-11-08 DIAGNOSIS — M25462 Effusion, left knee: Secondary | ICD-10-CM | POA: Diagnosis not present

## 2017-11-08 LAB — BASIC METABOLIC PANEL
Anion gap: 11 (ref 5–15)
BUN: 16 mg/dL (ref 6–20)
CHLORIDE: 105 mmol/L (ref 101–111)
CO2: 23 mmol/L (ref 22–32)
CREATININE: 0.75 mg/dL (ref 0.44–1.00)
Calcium: 8.5 mg/dL — ABNORMAL LOW (ref 8.9–10.3)
GFR calc Af Amer: >60 mL/min (ref >60)
GFR calc non Af Amer: >60 mL/min (ref >60)
GLUCOSE: 128 mg/dL — AB (ref 65–99)
Potassium: 3.7 mmol/L (ref 3.5–5.1)
SODIUM: 139 mmol/L (ref 135–145)

## 2017-11-08 LAB — GLUCOSE, CAPILLARY: Glucose-Capillary: 108 mg/dL — ABNORMAL HIGH (ref 65–99)

## 2017-11-08 LAB — ECHOCARDIOGRAM COMPLETE
HEIGHTINCHES: 61 in
WEIGHTICAEL: 2624 [oz_av]

## 2017-11-08 NOTE — Plan of Care (Signed)
  Education: Knowledge of General Education information will improve 11/08/2017 0056 - Progressing by Theador Hawthorne, RN   Activity: Risk for activity intolerance will decrease 11/08/2017 0056 - Progressing by Theador Hawthorne, RN   Pain Managment: General experience of comfort will improve 11/08/2017 0056 - Progressing by Theador Hawthorne, RN   Safety: Ability to remain free from injury will improve 11/08/2017 0056 - Progressing by Theador Hawthorne, RN

## 2017-11-08 NOTE — Progress Notes (Signed)
Orthopedic Tech Progress Note Patient Details:  Raven Gray 11-20-1927 [id number]  Ortho Devices Type of Ortho Device: Knee Immobilizer Ortho Device/Splint Location: LLE Ortho Device/Splint Interventions: Ordered, Application   Post Interventions Patient Tolerated: Well Instructions Provided: Care of device   Braulio Bosch 11/08/2017, 7:40 PM

## 2017-11-08 NOTE — Progress Notes (Addendum)
[facility] Progress Note  Patient: Raven Gray KGY:[id number]   PCP: [name], MD DOB: [birth year]   DOA: 11/06/2017   DOS: 11/08/2017   Date of Service: the patient was seen and examined on 11/08/2017  Subjective: Patient denies any acute complaint.  No dizziness no headache.  No chest pain no abdominal pain.  No nausea no vomiting.  She mentions she is eating okay. She had some pain in her left knee whenever she was mobilizing.  Denies any numbness or tingling.  No diarrhea or constipation reported.  Brief hospital course: Pt. with PMH of tachybradycardia syndrome S/P PPM placement, CVA, HTN, HLD, A. fib taken off anticoagulation due to recurrent fall, COPD; admitted on 11/06/2017, presented with complaint of syncopal event, was found to have unspecified syncope. Currently further plan is continue close monitoring.  Assessment and Plan: 1.  Syncope. Recurrent, recent hospitalization and discharge on 11/04/17. Brought to hospital on 2/15 with complaint of generalized weakness, unresponsive episode with syncope. So far no recurrence of the symptoms while in the hospital. Orthostatic are negative.  No focal deficit with clinical examination. No significant event on telemetry as well. Echocardiogram shows preserved EF, no significant valvular abnormality. Moderate AR would not likely be causing syncopal events. CT of the head negative for any acute abnormality. CT angios chest no PE. PT OT recommends SNF. Medications adjusted so far. Awaiting placement, awaiting PSS R number.  2.  Chronic A. Fib. Essential hypertension. Currently rate controlled. Chads score 6. Patient was on anticoagulation it was discontinued recently due to her recurrent fall. Continue rate control medications.  3.  Left knee pain. X-ray was performed yesterday which was showing evidence of possible medial condyle fracture. CT scan of the knee was performed after discussion with orthopedic doctor Sharol Given on  call. CT scan negative for any acute fracture. Was recommended to use knee immobilizer if patient continues to have pain while ambulation.  4.  Nasal fracture. ENT consulted appreciate input. Outpatient follow-up. Packing removed. Keflex for secondary prophylaxis, complete 7-day treatment course.  5.  History of COPD. Chronic respiratory failure with hypoxia. Continue current home regimen.  Diet: Cardiac diet DVT Prophylaxis: subcutaneous Heparin Advance goals of care discussion: DNR DNI  Family Communication: family was present at bedside, at the time of interview. The pt provided permission to discuss medical plan with the family. Opportunity was given to ask question and all questions were answered satisfactorily.   Disposition:  Discharge to SNF.  Consultants: Orthopedics, ENT Procedures: Echocardiogram   Antibiotics: Anti-infectives (From admission, onward)   Start     Dose/Rate Route Frequency Ordered Stop   11/06/17 2200  cephALEXin (KEFLEX) capsule 500 mg     500 mg Oral 2 times daily 11/06/17 1528 11/12/17 0959       Objective: Physical Exam: Vitals:   11/07/17 2010 11/07/17 2301 11/08/17 0445 11/08/17 1137  BP: (!) 143/78  (!) 161/91 (!) 132/56  Pulse: 80 82 75 (!) 117  Resp: 18  18 20   Temp: 98.7 F (37.1 C)  98.8 F (37.1 C) 97.9 F (36.6 C)  TempSrc: Oral  Oral Oral  SpO2: 100%  97% 93%  Weight:   80.3 kg (177 lb)   Height:        Intake/Output Summary (Last 24 hours) at 11/08/2017 1913 Last data filed at 11/08/2017 1848 Gross per 24 hour  Intake 720 ml  Output 501 ml  Net 219 ml   Filed Weights   11/06/17 0955 11/08/17 0445  Weight: 74.4 kg (164 lb) 80.3 kg (177 lb)   General: Alert, Awake and Oriented to Time, Place and Person. Appear in no distress, affect nppropriate Eyes: PERRL, Conjunctiva normal ENT: Oral Mucosa clear moist. Neck: no JVD, no Abnormal Mass Or lumps Cardiovascular: S1 and S2 Present, aortic systolic Murmur, Peripheral  Pulses Present Respiratory: normal respiratory effort, Bilateral Air entry equal and Decreased, no use of accessory muscle, Clear to Auscultation, no Crackles, no wheezes Abdomen: Bowel Sound present, Soft and no tenderness, no hernia Skin: no redness, no Rash, no induration Extremities: no Pedal edema, no calf tenderness Neurologic: Grossly no focal neuro deficit. Bilaterally Equal motor strength  Data Reviewed: CBC: Recent Labs  Lab 11/02/17 1127 11/03/17 0442 11/04/17 0447 11/06/17 1009 11/07/17 0542  WBC 8.8 8.6 7.5 10.6* 9.0  NEUTROABS 5.8  --   --  7.5  --   HGB 14.8 13.7 13.3 13.4 11.9*  HCT 48.2* 44.2 43.0 42.8 38.4  MCV 88.9 88.0 88.1 88.2 87.5  PLT 165 164 149* 156 462   Basic Metabolic Panel: Recent Labs  Lab 11/02/17 1636 11/03/17 0442 11/04/17 0447 11/06/17 1009 11/07/17 0542 11/08/17 0801  NA  --  141 139 140 138 139  K  --  3.9 3.1* 3.6 3.3* 3.7  CL  --  102 106 103 103 105  CO2  --  26 22 23 24 23   GLUCOSE  --  128* 152* 121* 116* 128*  BUN  --  15 9 14 17 16   CREATININE  --  0.87 0.82 1.00 0.87 0.75  CALCIUM  --  9.0 8.5* 8.8* 8.4* 8.5*  MG 2.0  --   --  1.8  --   --     Liver Function Tests: Recent Labs  Lab 11/02/17 1127 11/06/17 1009  AST 23 20  ALT 15 8*  ALKPHOS 100 75  BILITOT 0.9 1.3*  PROT 6.9 6.2*  ALBUMIN 3.7 2.9*   No results for input(s): LIPASE, AMYLASE in the last 168 hours. No results for input(s): AMMONIA in the last 168 hours. Coagulation Profile: No results for input(s): INR, PROTIME in the last 168 hours. Cardiac Enzymes: Recent Labs  Lab 11/02/17 1127 11/06/17 1009  CKTOTAL  --  113  TROPONINI <0.03  --    BNP (last 3 results) No results for input(s): PROBNP in the last 8760 hours. CBG: Recent Labs  Lab 11/03/17 0653 11/07/17 0611 11/08/17 0559  GLUCAP 103* 117* 108*   Studies: Ct Knee Left Wo Contrast  Result Date: 11/08/2017 CLINICAL DATA:  Left knee pain after fall 1 week ago. EXAM: CT OF THE LEFT  KNEE WITHOUT CONTRAST TECHNIQUE: Multidetector CT imaging of the left knee was performed according to the standard protocol. Multiplanar CT image reconstructions were also generated. COMPARISON:  Left knee x-rays dated November 06, 2017. FINDINGS: Bones/Joint/Cartilage No acute fracture or dislocation. Severe medial and lateral patellofemoral compartment joint space narrowing with subchondral sclerosis and cystic change. Mild lateral compartment joint space narrowing. Tricompartmental osteophytes. Small suprapatellar joint effusion. Small Baker cyst. Osteopenia. Ligaments Suboptimally assessed by CT. Muscles and Tendons Dystrophic calcifications within the distal quadriceps tendon. The extensor mechanism is intact. No muscle atrophy. Soft tissues Unremarkable. IMPRESSION: 1.  No acute osseous abnormality.  No fracture. 2. Severe medial and patellofemoral compartment osteoarthritis. 3. Small suprapatellar joint effusion.  Small Baker cyst. Electronically Signed   By: Titus Dubin M.D.   On: 11/08/2017 13:09    Scheduled Meds: . cephALEXin  500 mg  Oral BID  . metoprolol tartrate  25 mg Oral BID  . pantoprazole  40 mg Oral Daily  . pravastatin  40 mg Oral q1800  . sodium chloride flush  3 mL Intravenous Q12H  . venlafaxine XR  225 mg Oral Q breakfast   Continuous Infusions: PRN Meds: acetaminophen **OR** acetaminophen, ondansetron **OR** ondansetron (ZOFRAN) IV, senna-docusate, sodium chloride, traZODone  Time spent: 35 minutes  Author: Berle Mull, MD [facility] Pager: [phone] 11/08/2017 7:13 PM  If 7PM-7AM, please contact night-coverage at [email or web address], password [facility]

## 2017-11-09 DIAGNOSIS — M6281 Muscle weakness (generalized): Secondary | ICD-10-CM | POA: Diagnosis not present

## 2017-11-09 DIAGNOSIS — M19012 Primary osteoarthritis, left shoulder: Secondary | ICD-10-CM | POA: Diagnosis not present

## 2017-11-09 DIAGNOSIS — I083 Combined rheumatic disorders of mitral, aortic and tricuspid valves: Secondary | ICD-10-CM | POA: Diagnosis not present

## 2017-11-09 DIAGNOSIS — J449 Chronic obstructive pulmonary disease, unspecified: Secondary | ICD-10-CM | POA: Diagnosis not present

## 2017-11-09 DIAGNOSIS — R55 Syncope and collapse: Secondary | ICD-10-CM

## 2017-11-09 DIAGNOSIS — Z950 Presence of cardiac pacemaker: Secondary | ICD-10-CM | POA: Diagnosis not present

## 2017-11-09 DIAGNOSIS — J9611 Chronic respiratory failure with hypoxia: Secondary | ICD-10-CM | POA: Diagnosis not present

## 2017-11-09 DIAGNOSIS — Z5189 Encounter for other specified aftercare: Secondary | ICD-10-CM | POA: Diagnosis not present

## 2017-11-09 DIAGNOSIS — R296 Repeated falls: Secondary | ICD-10-CM | POA: Diagnosis not present

## 2017-11-09 DIAGNOSIS — G9340 Encephalopathy, unspecified: Secondary | ICD-10-CM | POA: Diagnosis not present

## 2017-11-09 DIAGNOSIS — I480 Paroxysmal atrial fibrillation: Secondary | ICD-10-CM | POA: Diagnosis not present

## 2017-11-09 DIAGNOSIS — K219 Gastro-esophageal reflux disease without esophagitis: Secondary | ICD-10-CM | POA: Diagnosis not present

## 2017-11-09 DIAGNOSIS — M1712 Unilateral primary osteoarthritis, left knee: Secondary | ICD-10-CM | POA: Diagnosis not present

## 2017-11-09 DIAGNOSIS — R2689 Other abnormalities of gait and mobility: Secondary | ICD-10-CM | POA: Diagnosis not present

## 2017-11-09 DIAGNOSIS — I10 Essential (primary) hypertension: Secondary | ICD-10-CM | POA: Diagnosis not present

## 2017-11-09 DIAGNOSIS — R05 Cough: Secondary | ICD-10-CM | POA: Diagnosis not present

## 2017-11-09 DIAGNOSIS — R488 Other symbolic dysfunctions: Secondary | ICD-10-CM | POA: Diagnosis not present

## 2017-11-09 DIAGNOSIS — I4891 Unspecified atrial fibrillation: Secondary | ICD-10-CM | POA: Diagnosis not present

## 2017-11-09 DIAGNOSIS — I5031 Acute diastolic (congestive) heart failure: Secondary | ICD-10-CM | POA: Diagnosis not present

## 2017-11-09 DIAGNOSIS — M79605 Pain in left leg: Secondary | ICD-10-CM | POA: Diagnosis not present

## 2017-11-09 DIAGNOSIS — R5381 Other malaise: Secondary | ICD-10-CM | POA: Diagnosis not present

## 2017-11-09 DIAGNOSIS — S022XXA Fracture of nasal bones, initial encounter for closed fracture: Secondary | ICD-10-CM | POA: Diagnosis not present

## 2017-11-09 DIAGNOSIS — S0181XA Laceration without foreign body of other part of head, initial encounter: Secondary | ICD-10-CM | POA: Diagnosis not present

## 2017-11-09 DIAGNOSIS — M19011 Primary osteoarthritis, right shoulder: Secondary | ICD-10-CM | POA: Diagnosis not present

## 2017-11-09 DIAGNOSIS — I639 Cerebral infarction, unspecified: Secondary | ICD-10-CM | POA: Diagnosis not present

## 2017-11-09 DIAGNOSIS — I110 Hypertensive heart disease with heart failure: Secondary | ICD-10-CM | POA: Diagnosis not present

## 2017-11-09 DIAGNOSIS — I472 Ventricular tachycardia: Secondary | ICD-10-CM | POA: Diagnosis not present

## 2017-11-09 DIAGNOSIS — E860 Dehydration: Secondary | ICD-10-CM | POA: Diagnosis not present

## 2017-11-09 DIAGNOSIS — Z8673 Personal history of transient ischemic attack (TIA), and cerebral infarction without residual deficits: Secondary | ICD-10-CM | POA: Diagnosis not present

## 2017-11-09 DIAGNOSIS — R040 Epistaxis: Secondary | ICD-10-CM | POA: Diagnosis not present

## 2017-11-09 DIAGNOSIS — Z86718 Personal history of other venous thrombosis and embolism: Secondary | ICD-10-CM | POA: Diagnosis not present

## 2017-11-09 DIAGNOSIS — S022XXD Fracture of nasal bones, subsequent encounter for fracture with routine healing: Secondary | ICD-10-CM | POA: Diagnosis not present

## 2017-11-09 DIAGNOSIS — Z86711 Personal history of pulmonary embolism: Secondary | ICD-10-CM | POA: Diagnosis not present

## 2017-11-09 DIAGNOSIS — Z96659 Presence of unspecified artificial knee joint: Secondary | ICD-10-CM | POA: Diagnosis not present

## 2017-11-09 DIAGNOSIS — I482 Chronic atrial fibrillation: Secondary | ICD-10-CM | POA: Diagnosis not present

## 2017-11-09 LAB — GLUCOSE, CAPILLARY: Glucose-Capillary: 107 mg/dL — ABNORMAL HIGH (ref 65–99)

## 2017-11-09 MED ORDER — FUROSEMIDE 20 MG PO TABS: 20.0000 mg | ORAL_TABLET | Freq: Every day | ORAL | Status: DC | PRN

## 2017-11-09 MED ORDER — ALPRAZOLAM 0.25 MG PO TABS
0.2500 mg | ORAL_TABLET | Freq: Once | ORAL | Status: AC
  Administered 2017-11-09: 0.25 mg via ORAL
  Filled 2017-11-09: qty 1

## 2017-11-09 MED ORDER — POTASSIUM CHLORIDE CRYS ER 20 MEQ PO TBCR: 20.0000 meq | EXTENDED_RELEASE_TABLET | Freq: Every day | ORAL | 0 refills | Status: AC | PRN

## 2017-11-09 NOTE — Progress Notes (Signed)
[facility] came to transport patient to [facility], Discharge papers reviewed. Pt discharge in stable condition.

## 2017-11-09 NOTE — Discharge Summary (Signed)
[facility] Discharge Summary   Patient: Raven Gray UUV:[id number]   PCP: [name], MD DOB: [birth year]   Date of admission: 11/06/2017   Date of discharge:  11/09/2017    Discharge Diagnoses:  Principal Problem:   Syncope and collapse Active Problems:   Aortic insufficiency   COPD (chronic obstructive pulmonary disease) (HCC)   Essential hypertension   GERD (gastroesophageal reflux disease)   Dyspnea on exertion   Chronic respiratory failure with hypoxia (HCC)   Syncope   Atrial fibrillation (HCC)   Nasal fracture   Hypertension   Palliative care by specialist   Unilateral primary osteoarthritis, left knee   Admitted From: home Disposition:  SNF  Recommendations for Outpatient Follow-up:  1. Please follow-up with PCP in 1 week. 2. Please follow-up with orthopedic as recommended.   Contact information for follow-up providers    Newt Minion, MD Follow up in 2 week(s).   Specialty:  Orthopedic Surgery Contact information: [place] [address] [phone]            Contact information for after-discharge care    Destination    [facility] .   Service:  Skilled Nursing Contact information: [address] [place] [phone]                 Diet recommendation: Cardiac diet  Activity: The patient is advised to gradually reintroduce usual activities.  Discharge Condition: good  Code Status: DNR/DNI  History of present illness: As per the H and P dictated on admission, " Raven Gray is a 84 y.o. female with medical history significant for tachybradycardia syndrome status post pacemaker, CVA, hypertension, hyperlipidemia, history of VT, A. fib on Xarelto and COPD presents emergency department with a chief complaint syncope. Right hospitalists are asked to admit.  Information is obtained from the patient and her daughter and granddaughter who are primary caregivers and at the  bedside. She was discharged 2 days ago after suffering a fall as a result of a syncopal episode. She suffered nasal fracture. During that hospitalization cardiology was consulted who opined a history of permanent atrial fibrillation with pauses status post P p.m. as well as labile hypertension with evidence of orthostatic changes and cardiology opined anticoagulation to high risk. Family reports patient has not been eating and drinking her normal amount over the 2 days that she's been home. She's complained of a "burning" in both her feet particularly when weightbearing. This morning they were assisting her out of bed to transfer to the bedside commode. She voided and when bearing weight to put it back to bed she syncopized. Family called the health alert he recommended that Place her on the floor. Family placed her on the floor and said she woke up shortly thereafter. No report of fever chills cough nausea vomiting. No dysuria hematuria frequency or urgency. No diarrhea constipation melena bright red blood per rectum."  Hospital Course:  Summary of her active problems in the hospital is as following. 1.  Syncope. Recurrent, recent hospitalization and discharge on 11/04/17. Brought to hospital on 2/15 with complaint of generalized weakness, unresponsive episode with syncope. So far no recurrence of the symptoms while in the hospital. Orthostatic are negative.  No focal deficit with clinical examination. No significant event on telemetry as well. Echocardiogram shows preserved EF, no significant valvular abnormality. Moderate AR would not likely be causing syncopal events. CT of the head negative for any acute abnormality. CT angio  chest no PE. PT OT recommends SNF. Medications adjusted so far.  2.  Chronic A. Fib. Essential hypertension. Currently rate controlled. Chads score 6. Patient was on anticoagulation it was discontinued recently due to her recurrent fall. Continue rate control  medications.  3.  Left knee pain. X-ray was performed yesterday which was showing evidence of possible medial condyle fracture. CT scan of the knee was performed after discussion with orthopedic doctor Sharol Given on call. CT scan negative for any acute fracture. Was recommended to use knee immobilizer when ambulating  4.  Nasal fracture. ENT consulted appreciate input. Outpatient follow-up. Packing removed. Keflex for secondary prophylaxis, complete 7-day treatment course.  Last day on 01-22-202019  5.  History of COPD. Chronic respiratory failure with hypoxia. Continue current home regimen.  All other chronic medical condition were stable during the hospitalization.  Patient was seen by physical therapy, who recommended SNF, which was arranged by Education officer, museum and case Freight forwarder. On the day of the discharge the patient's vitals were stable, and no other acute medical condition were reported by patient. the patient was felt safe to be discharge at SNF with therapy.  Procedures and Results:  Echocardiogram    Consultations:  Orthopedics  DISCHARGE MEDICATION: Allergies as of 11/09/2017   No Known Allergies     Medication List    STOP taking these medications   ALPRAZolam 0.25 MG tablet Commonly known as:  XANAX     TAKE these medications   acetaminophen 325 MG tablet Commonly known as:  TYLENOL Take 650 mg by mouth every 6 (six) hours as needed (for pain).   cephALEXin 500 MG capsule Commonly known as:  KEFLEX Take 1 capsule (500 mg total) by mouth 2 (two) times daily for 7 days.   furosemide 20 MG tablet Commonly known as:  LASIX Take 1 tablet (20 mg total) by mouth daily as needed for fluid or edema. What changed:    medication strength  how much to take  when to take this  reasons to take this   metoprolol tartrate 25 MG tablet Commonly known as:  LOPRESSOR Take 1 tablet (25 mg total) by mouth 2 (two) times daily.   nitroGLYCERIN 0.4 MG SL tablet Commonly  known as:  NITROSTAT Place 0.4 mg under the tongue every 5 (five) minutes as needed for chest pain. X 3 doses   omeprazole 20 MG capsule Commonly known as:  PRILOSEC Take 20 mg by mouth daily as needed (for reflux/heartburn).   OXYGEN Inhale 2 L into the lungs continuous.   potassium chloride SA 20 MEQ tablet Commonly known as:  K-DUR,KLOR-CON Take 1 tablet (20 mEq total) by mouth daily as needed (along with lasix.). What changed:    how much to take  when to take this  reasons to take this   pravastatin 40 MG tablet Commonly known as:  PRAVACHOL Take 40 mg by mouth every morning.   venlafaxine XR 150 MG 24 hr capsule Commonly known as:  EFFEXOR-XR Take 150 mg by mouth daily.   venlafaxine XR 75 MG 24 hr capsule Commonly known as:  EFFEXOR-XR Take 1 capsule by mouth daily.   WOMENS 50+ MULTI VITAMIN/MIN Tabs Take 1 tablet by mouth daily.   zolpidem 5 MG tablet Commonly known as:  AMBIEN Take 5 mg by mouth at bedtime.      No Known Allergies Discharge Instructions    Diet - low sodium heart healthy   Complete by:  As directed    Discharge  instructions   Complete by:  As directed    It is important that you read following instructions as well as go over your medication list with RN to help you understand your care after this hospitalization.  Discharge Instructions: Please follow-up with PCP in one week  Please request your primary care physician to go over all Hospital Tests and Procedure/Radiological results at the follow up,  Please get all Hospital records sent to your PCP by signing hospital release before you go home.   Do not take more than prescribed Pain, Sleep and Anxiety Medications. You were cared for by a hospitalist during your hospital stay. If you have any questions about your discharge medications or the care you received while you were in the hospital after you are discharged, you can call the unit and ask to speak with the hospitalist on call  if the hospitalist that took care of you is not available.  Once you are discharged, your primary care physician will handle any further medical issues. Please note that NO REFILLS for any discharge medications will be authorized once you are discharged, as it is imperative that you return to your primary care physician (or establish a relationship with a primary care physician if you do not have one) for your aftercare needs so that they can reassess your need for medications and monitor your lab values. You Must read complete instructions/literature along with all the possible adverse reactions/side effects for all the Medicines you take and that have been prescribed to you. Take any new Medicines after you have completely understood and accept all the possible adverse reactions/side effects. Wear Seat belts while driving.   Increase activity slowly   Complete by:  As directed      Discharge Exam: Filed Weights   11/06/17 0955 11/08/17 0445 11/09/17 0721  Weight: 74.4 kg (164 lb) 80.3 kg (177 lb) 79.8 kg (176 lb)   Vitals:   11/09/17 0721 11/09/17 1222  BP: 118/62 137/84  Pulse: 70 69  Resp:  20  Temp: 98.1 F (36.7 C) 97.8 F (36.6 C)  SpO2: 93% 100%   General: Appear in no distress, no Rash; Oral Mucosa moist. Cardiovascular: S1 and S2 Present, no Murmur, no JVD Respiratory: Bilateral Air entry present and Clear to Auscultation, no Crackles, no wheezes Abdomen: Bowel Sound present, Soft and no tenderness Extremities: no Pedal edema, no calf tenderness Neurology: Grossly no focal neuro deficit.  The results of significant diagnostics from this hospitalization (including imaging, microbiology, ancillary and laboratory) are listed below for reference.    Significant Diagnostic Studies: Dg Chest 2 View  Result Date: 11/06/2017 CLINICAL DATA:  Syncopal episode EXAM: CHEST  2 VIEW COMPARISON:  11/02/2017 FINDINGS: The lungs are hyperinflated likely secondary to COPD. There is no  focal parenchymal opacity. There is no pleural effusion or pneumothorax. There is stable cardiomegaly. There is thoracic aortic atherosclerosis. There is a single lead cardiac pacemaker. There is mild osteoarthritis of bilateral glenohumeral joints. IMPRESSION: No active cardiopulmonary disease. Electronically Signed   By: Kathreen Devoid   On: 11/06/2017 11:10   Ct Head Wo Contrast  Result Date: 11/06/2017 CLINICAL DATA:  Syncope recurrent.  Recent fall with head injury EXAM: CT HEAD WITHOUT CONTRAST TECHNIQUE: Contiguous axial images were obtained from the base of the skull through the vertex without intravenous contrast. COMPARISON:  CT head 11/02/2017 FINDINGS: Brain: Moderate atrophy. Moderate chronic microvascular ischemic changes throughout the white matter. Small chronic infarct left occipital lobe unchanged. Negative for  acute infarct. Negative for intracranial hemorrhage or mass. No midline shift. Vascular: Negative for hyperdense vessel Skull: Negative fracture of the nasal bone again noted. No other skull fracture. Sinuses/Orbits: Air-fluid levels in the sphenoid sinus remains. Improvement in air-fluid levels in the maxillary sinus bilaterally which have resolved. Mild mucosal edema in the paranasal sinuses. Bilateral cataract removal. Other: None IMPRESSION: Atrophy and chronic ischemic change. No acute intracranial abnormality Nasal bone fracture again noted with air-fluid level in the sphenoid sinus. Resolution of bilateral maxillary sinus air-fluid levels. Electronically Signed   By: Franchot Gallo M.D.   On: 11/06/2017 11:03   Ct Head Wo Contrast  Result Date: 11/02/2017 CLINICAL DATA:  Unwitnessed fall to floor with injury to nose and left orbit. EXAM: CT HEAD WITHOUT CONTRAST CT MAXILLOFACIAL WITHOUT CONTRAST CT CERVICAL SPINE WITHOUT CONTRAST TECHNIQUE: Multidetector CT imaging of the head, cervical spine, and maxillofacial structures were performed using the standard protocol without  intravenous contrast. Multiplanar CT image reconstructions of the cervical spine and maxillofacial structures were also generated. COMPARISON:  Head CT 01/08/2017 FINDINGS: CT HEAD FINDINGS Brain: Ventricles, cisterns and other CSF spaces are within normal. There is mild chronic ischemic microvascular disease and minimal age related atrophic change. No mass, mass effect, shift of midline structures or acute hemorrhage. No evidence of acute infarction. Vascular: No hyperdense vessel or unexpected calcification. Skull: Minimally displaced nasal bone fracture. Other: Moderate opacification with air-fluid levels over the sinuses likely hemorrhagic debris. CT MAXILLOFACIAL FINDINGS Osseous: Examination demonstrates a minimally displaced nasal bone fracture. No additional facial bone fractures are identified. Degenerate change of the temporomandibular joints bilaterally. Mild degenerate change of the spine. Orbits: Soft tissue swelling over the right periorbital region. The globes and retrobulbar spaces are normal symmetric. Sinuses: Paranasal sinuses are well developed as there is opacification with scattered air-fluid levels present likely combination of inflammatory change and hemorrhagic debris. Deviation of the nasal septum to the left. Soft tissues: Right periorbital soft tissue swelling. CT CERVICAL SPINE FINDINGS Alignment: Subtle 2 mm anterior subluxation of C3 on C4 likely degenerative. No traumatic subluxation. Skull base and vertebrae: Vertebral body heights are normal. There is mild to moderate spondylosis of the cervical spine. No evidence of acute fracture. Mild bilateral neural foraminal narrowing at several levels due to adjacent bony spurring. There is moderate uncovertebral joint spurring and facet arthropathy. Soft tissues and spinal canal: Prevertebral soft tissues are normal. Spinal canal is within normal. Disc levels: Disc space narrowing at the C4-5, C5-6 and C6-7 levels. Upper chest: Within  normal. Other: None. IMPRESSION: No acute brain injury. Mild chronic ischemic microvascular disease and age related atrophic change. Minimally displaced nasal bone fracture. Right periorbital soft tissue swelling. Combination of inflammatory change and hemorrhagic debris within the paranasal sinuses. No acute cervical spine injury. Moderate spondylosis of the cervical spine with multilevel disc disease. Electronically Signed   By: Marin Olp M.D.   On: 11/02/2017 12:18   Ct Angio Chest Pe W And/or Wo Contrast  Result Date: 11/06/2017 CLINICAL DATA:  Syncopal episode this morning. EXAM: CT ANGIOGRAPHY CHEST WITH CONTRAST TECHNIQUE: Multidetector CT imaging of the chest was performed using the standard protocol during bolus administration of intravenous contrast. Multiplanar CT image reconstructions and MIPs were obtained to evaluate the vascular anatomy. CONTRAST:  11mL ISOVUE-370 IOPAMIDOL (ISOVUE-370) INJECTION 76% COMPARISON:  Chest radiograph 11/06/2017 FINDINGS: Cardiovascular: Enlarged heart. No pericardial effusion. Calcific atherosclerotic disease of the aorta and coronary arteries. Cardiac pacemaker with appropriate lead position. No evidence of pulmonary  embolus. Mediastinum/Nodes: No enlarged mediastinal, hilar, or axillary lymph nodes. Thyroid gland, trachea, and esophagus demonstrate no significant findings. Large hiatal hernia. Lungs/Pleura: Mild chronic interstitial lung changes. Two adjacent subpleural pulmonary nodules in the medial basilar portion the right lung, the larger measuring 8 mm. Image 75/105, sequence 8. Upper Abdomen: Left nonobstructive renal calculi. Musculoskeletal: Scoliosis and findings of diffuse idiopathic skeletal hyperostosis of the thoracic spine. Review of the MIP images confirms the above findings. IMPRESSION: No evidence of pulmonary embolus. Mild fusiform dilation of the ascending aorta measuring 4.3 cm in maximum diameter. Recommend annual imaging followup by CTA or  MRA. This recommendation follows 2010 ACCF/AHA/AATS/ACR/ASA/SCA/SCAI/SIR/STS/SVM Guidelines for the Diagnosis and Management of Patients with Thoracic Aortic Disease. Circulation. 2010; 121: E952-W413 Right-sided pulmonary nodules, the larger measuring 8 mm. Non-contrast chest CT at 3-6 months is recommended. If the nodules are stable at time of repeat CT, then future CT at 18-24 months (from today's scan) is considered optional for low-risk patients, but is recommended for high-risk patients. This recommendation follows the consensus statement: Guidelines for Management of Incidental Pulmonary Nodules Detected on CT Images: From the Fleischner Society 2017; Radiology 2017; 284:228-243. Enlarged heart. Aortic Atherosclerosis (ICD10-I70.0). Electronically Signed   By: Fidela Salisbury M.D.   On: 11/06/2017 14:41   Ct Cervical Spine Wo Contrast  Result Date: 11/02/2017 CLINICAL DATA:  Unwitnessed fall to floor with injury to nose and left orbit. EXAM: CT HEAD WITHOUT CONTRAST CT MAXILLOFACIAL WITHOUT CONTRAST CT CERVICAL SPINE WITHOUT CONTRAST TECHNIQUE: Multidetector CT imaging of the head, cervical spine, and maxillofacial structures were performed using the standard protocol without intravenous contrast. Multiplanar CT image reconstructions of the cervical spine and maxillofacial structures were also generated. COMPARISON:  Head CT 01/08/2017 FINDINGS: CT HEAD FINDINGS Brain: Ventricles, cisterns and other CSF spaces are within normal. There is mild chronic ischemic microvascular disease and minimal age related atrophic change. No mass, mass effect, shift of midline structures or acute hemorrhage. No evidence of acute infarction. Vascular: No hyperdense vessel or unexpected calcification. Skull: Minimally displaced nasal bone fracture. Other: Moderate opacification with air-fluid levels over the sinuses likely hemorrhagic debris. CT MAXILLOFACIAL FINDINGS Osseous: Examination demonstrates a minimally  displaced nasal bone fracture. No additional facial bone fractures are identified. Degenerate change of the temporomandibular joints bilaterally. Mild degenerate change of the spine. Orbits: Soft tissue swelling over the right periorbital region. The globes and retrobulbar spaces are normal symmetric. Sinuses: Paranasal sinuses are well developed as there is opacification with scattered air-fluid levels present likely combination of inflammatory change and hemorrhagic debris. Deviation of the nasal septum to the left. Soft tissues: Right periorbital soft tissue swelling. CT CERVICAL SPINE FINDINGS Alignment: Subtle 2 mm anterior subluxation of C3 on C4 likely degenerative. No traumatic subluxation. Skull base and vertebrae: Vertebral body heights are normal. There is mild to moderate spondylosis of the cervical spine. No evidence of acute fracture. Mild bilateral neural foraminal narrowing at several levels due to adjacent bony spurring. There is moderate uncovertebral joint spurring and facet arthropathy. Soft tissues and spinal canal: Prevertebral soft tissues are normal. Spinal canal is within normal. Disc levels: Disc space narrowing at the C4-5, C5-6 and C6-7 levels. Upper chest: Within normal. Other: None. IMPRESSION: No acute brain injury. Mild chronic ischemic microvascular disease and age related atrophic change. Minimally displaced nasal bone fracture. Right periorbital soft tissue swelling. Combination of inflammatory change and hemorrhagic debris within the paranasal sinuses. No acute cervical spine injury. Moderate spondylosis of the cervical spine with multilevel  disc disease. Electronically Signed   By: Marin Olp M.D.   On: 11/02/2017 12:18   Ct Lumbar Spine Wo Contrast  Result Date: 11/06/2017 CLINICAL DATA:  Syncope.  Back pain. EXAM: CT LUMBAR SPINE WITHOUT CONTRAST TECHNIQUE: Multidetector CT imaging of the lumbar spine was performed without intravenous contrast administration. Multiplanar  CT image reconstructions were also generated. COMPARISON:  CT lumbar spine 10/07/2017 FINDINGS: Segmentation: Normal segmentation. Moderate dextroscoliosis at L2-3 is unchanged. Alignment: Mild retrolisthesis L1-2, L2-3, L3-4 unchanged. Vertebrae: Negative for acute fracture. Superior endplate deformity at L2 is unchanged and appears chronic. Paraspinal and other soft tissues: Calcific atherosclerotic disease in the aorta. Bilateral nonobstructing renal calculi. 3 cm right upper pole cyst unchanged. No retroperitoneal adenopathy Disc levels: T12-L1: Moderate disc degeneration and spurring without significant stenosis L1-2: Mild disc and facet degeneration without significant stenosis L2-3: Disc degeneration and spurring left greater than right. Retrolisthesis. Moderate left foraminal encroachment due to spurring. Subarticular stenosis on the left due to spurring. Spinal canal adequate in size. L3-4: Disc degeneration and spurring, left greater than right. Moderate facet hypertrophy bilaterally. Mild spinal stenosis. Moderate subarticular and foraminal stenosis on the left. L4-5: Interspinous fusion device unchanged in position. Posterior bony fusion. Marked facet hypertrophy. Moderate spinal stenosis unchanged. Right subarticular and foraminal stenosis due to spurring unchanged. L5-S1: Disc degeneration and spurring, right greater than left. Moderate right foraminal encroachment due to spurring unchanged. Bilateral facet hypertrophy contributes to foraminal stenosis. IMPRESSION: Negative for acute fracture Scoliosis and multilevel degenerative changes similar to the recent CT of 10/07/2017. Electronically Signed   By: Franchot Gallo M.D.   On: 11/06/2017 14:46   Ct Knee Left Wo Contrast  Result Date: 11/08/2017 CLINICAL DATA:  Left knee pain after fall 1 week ago. EXAM: CT OF THE LEFT KNEE WITHOUT CONTRAST TECHNIQUE: Multidetector CT imaging of the left knee was performed according to the standard protocol.  Multiplanar CT image reconstructions were also generated. COMPARISON:  Left knee x-rays dated November 06, 2017. FINDINGS: Bones/Joint/Cartilage No acute fracture or dislocation. Severe medial and lateral patellofemoral compartment joint space narrowing with subchondral sclerosis and cystic change. Mild lateral compartment joint space narrowing. Tricompartmental osteophytes. Small suprapatellar joint effusion. Small Baker cyst. Osteopenia. Ligaments Suboptimally assessed by CT. Muscles and Tendons Dystrophic calcifications within the distal quadriceps tendon. The extensor mechanism is intact. No muscle atrophy. Soft tissues Unremarkable. IMPRESSION: 1.  No acute osseous abnormality.  No fracture. 2. Severe medial and patellofemoral compartment osteoarthritis. 3. Small suprapatellar joint effusion.  Small Baker cyst. Electronically Signed   By: Titus Dubin M.D.   On: 11/08/2017 13:09   Dg Chest Portable 1 View  Result Date: 11/02/2017 CLINICAL DATA:  Trauma secondary to a fall this morning. Facial lacerations. EXAM: PORTABLE CHEST 1 VIEW COMPARISON:  Chest x-ray dated 08/18/2017 and 07/02/2017 FINDINGS: Chronic cardiomegaly.  Large hiatal hernia.  Aortic atherosclerosis. Chronic slight accentuation of the interstitial markings bilaterally. No acute infiltrates or effusions. Pulmonary vascularity is normal. Pacemaker in place.  No acute bone abnormality. IMPRESSION: No acute abnormality. Chronic issue a shin of the interstitial markings. Huge hiatal hernia. Aortic Atherosclerosis (ICD10-I70.0). Electronically Signed   By: Lorriane Shire M.D.   On: 11/02/2017 11:39   Dg Knee Complete 4 Views Left  Addendum Date: 11/06/2017   ADDENDUM REPORT: 11/06/2017 20:22 ADDENDUM: In retrospect, there is a small step-off in the medial femoral condyle. This would be an unusual traumatic fracture and could represent a small insufficiency fracture of uncertain age. If the patient  has pain medially, an MRI could better  assess. These results will be called to the ordering clinician or representative by the Radiologist Assistant, and communication documented in the PACS or zVision Dashboard. Electronically Signed   By: Dorise Bullion III M.D   On: 11/06/2017 20:22   Result Date: 11/06/2017 CLINICAL DATA:  Fall 1 week ago with anterior pain. EXAM: LEFT KNEE - COMPLETE 4+ VIEW COMPARISON:  None. FINDINGS: Severe degenerative changes in the medial compartment with near complete loss of joint space. No significant joint effusion. Anterior soft tissue swelling identified. No convincing evidence of fracture. IMPRESSION: Anterior soft tissue swelling. No joint effusion. No obvious fracture. Severe degenerative changes. Electronically Signed: By: Dorise Bullion III M.D On: 11/06/2017 19:27   Dg Hips Bilat W Or Wo Pelvis 5 Views  Result Date: 11/06/2017 CLINICAL DATA:  Pain.  Recent fall EXAM: DG HIP (WITH OR WITHOUT PELVIS) 5+V BILAT COMPARISON:  None. FINDINGS: Frontal pelvis as well as frontal and lateral hips bilaterally-total five views-obtained. No evident acute fracture or dislocation. There is mild symmetric narrowing of both hip joints. No erosive change. Sacroiliac joints appear symmetric bilaterally. Bones are osteoporotic. IMPRESSION: Bones osteoporotic. No acute fracture or dislocation. Mild symmetric narrowing both hip joints. Electronically Signed   By: Lowella Grip III M.D.   On: 11/06/2017 13:31   Vas [place] Lower Extremity Venous (dvt) (only [facility])  Result Date: 11/06/2017  Lower Venous Study Indication: Edema. Examination Guidelines: A complete evaluation includes B-mode imaging, spectral doppler, color doppler, and power doppler as needed of all accessible portions of each vessel. Bilateral testing is considered an integral part of a complete examination. Limited examinations for reoccurring indications may be performed as noted. The reflux portion of the exam is performed with the patient in reverse  Trendelenburg.  Right Venous Findings: +---------+---------------+---------+-----------+----------+-------+          CompressibilityPhasicitySpontaneityPropertiesSummary +---------+---------------+---------+-----------+----------+-------+ CFV      Full           Yes      Yes                          +---------+---------------+---------+-----------+----------+-------+ FV Prox  Full                                                 +---------+---------------+---------+-----------+----------+-------+ FV Mid   Full                                                 +---------+---------------+---------+-----------+----------+-------+ FV DistalFull                                                 +---------+---------------+---------+-----------+----------+-------+ PFV      Full                                                 +---------+---------------+---------+-----------+----------+-------+ POP      Full  Yes      Yes                          +---------+---------------+---------+-----------+----------+-------+ PTV      Full                                                 +---------+---------------+---------+-----------+----------+-------+ PERO     Full                                                 +---------+---------------+---------+-----------+----------+-------+  Left Venous Findings: +---------+---------------+---------+-----------+----------+-------+          CompressibilityPhasicitySpontaneityPropertiesSummary +---------+---------------+---------+-----------+----------+-------+ CFV      Full           Yes      Yes                          +---------+---------------+---------+-----------+----------+-------+ FV Prox  Full                                                 +---------+---------------+---------+-----------+----------+-------+ FV Mid   Full                                                  +---------+---------------+---------+-----------+----------+-------+ FV DistalFull                                                 +---------+---------------+---------+-----------+----------+-------+ PFV      Full                                                 +---------+---------------+---------+-----------+----------+-------+ POP      Full           Yes      Yes                          +---------+---------------+---------+-----------+----------+-------+ PTV      Full                                                 +---------+---------------+---------+-----------+----------+-------+ PERO     Full                                                 +---------+---------------+---------+-----------+----------+-------+    Final Interpretation: Right: There is no evidence of deep vein thrombosis  in the lower extremity. No cystic structure found in the popliteal fossa. Left: There is no evidence of deep vein thrombosis in the lower extremity. A cystic structure is found in the popliteal fossa.  *See table(s) above for measurements and observations. Electronically signed by Deitra Mayo on 11/06/2017 at 3:29:56 PM.   Final   Ct Maxillofacial Wo Contrast  Result Date: 11/02/2017 CLINICAL DATA:  Unwitnessed fall to floor with injury to nose and left orbit. EXAM: CT HEAD WITHOUT CONTRAST CT MAXILLOFACIAL WITHOUT CONTRAST CT CERVICAL SPINE WITHOUT CONTRAST TECHNIQUE: Multidetector CT imaging of the head, cervical spine, and maxillofacial structures were performed using the standard protocol without intravenous contrast. Multiplanar CT image reconstructions of the cervical spine and maxillofacial structures were also generated. COMPARISON:  Head CT 01/08/2017 FINDINGS: CT HEAD FINDINGS Brain: Ventricles, cisterns and other CSF spaces are within normal. There is mild chronic ischemic microvascular disease and minimal age related atrophic change. No mass, mass effect, shift of midline  structures or acute hemorrhage. No evidence of acute infarction. Vascular: No hyperdense vessel or unexpected calcification. Skull: Minimally displaced nasal bone fracture. Other: Moderate opacification with air-fluid levels over the sinuses likely hemorrhagic debris. CT MAXILLOFACIAL FINDINGS Osseous: Examination demonstrates a minimally displaced nasal bone fracture. No additional facial bone fractures are identified. Degenerate change of the temporomandibular joints bilaterally. Mild degenerate change of the spine. Orbits: Soft tissue swelling over the right periorbital region. The globes and retrobulbar spaces are normal symmetric. Sinuses: Paranasal sinuses are well developed as there is opacification with scattered air-fluid levels present likely combination of inflammatory change and hemorrhagic debris. Deviation of the nasal septum to the left. Soft tissues: Right periorbital soft tissue swelling. CT CERVICAL SPINE FINDINGS Alignment: Subtle 2 mm anterior subluxation of C3 on C4 likely degenerative. No traumatic subluxation. Skull base and vertebrae: Vertebral body heights are normal. There is mild to moderate spondylosis of the cervical spine. No evidence of acute fracture. Mild bilateral neural foraminal narrowing at several levels due to adjacent bony spurring. There is moderate uncovertebral joint spurring and facet arthropathy. Soft tissues and spinal canal: Prevertebral soft tissues are normal. Spinal canal is within normal. Disc levels: Disc space narrowing at the C4-5, C5-6 and C6-7 levels. Upper chest: Within normal. Other: None. IMPRESSION: No acute brain injury. Mild chronic ischemic microvascular disease and age related atrophic change. Minimally displaced nasal bone fracture. Right periorbital soft tissue swelling. Combination of inflammatory change and hemorrhagic debris within the paranasal sinuses. No acute cervical spine injury. Moderate spondylosis of the cervical spine with multilevel disc  disease. Electronically Signed   By: Marin Olp M.D.   On: 11/02/2017 12:18    Microbiology: No results found for this or any previous visit (from the past 240 hour(s)).   Labs: CBC: Recent Labs  Lab 11/03/17 0442 11/04/17 0447 11/06/17 1009 11/07/17 0542  WBC 8.6 7.5 10.6* 9.0  NEUTROABS  --   --  7.5  --   HGB 13.7 13.3 13.4 11.9*  HCT 44.2 43.0 42.8 38.4  MCV 88.0 88.1 88.2 87.5  PLT 164 149* 156 381   Basic Metabolic Panel: Recent Labs  Lab 11/02/17 1636 11/03/17 0442 11/04/17 0447 11/06/17 1009 11/07/17 0542 11/08/17 0801  NA  --  141 139 140 138 139  K  --  3.9 3.1* 3.6 3.3* 3.7  CL  --  102 106 103 103 105  CO2  --  26 22 23 24 23   GLUCOSE  --  128* 152* 121* 116* 128*  BUN  --  15 9 14 17 16   CREATININE  --  0.87 0.82 1.00 0.87 0.75  CALCIUM  --  9.0 8.5* 8.8* 8.4* 8.5*  MG 2.0  --   --  1.8  --   --    Liver Function Tests: Recent Labs  Lab 11/06/17 1009  AST 20  ALT 8*  ALKPHOS 75  BILITOT 1.3*  PROT 6.2*  ALBUMIN 2.9*   No results for input(s): LIPASE, AMYLASE in the last 168 hours. No results for input(s): AMMONIA in the last 168 hours. Cardiac Enzymes: Recent Labs  Lab 11/06/17 1009  CKTOTAL 113   BNP (last 3 results) Recent Labs    07/01/17 0825 11/06/17 0946  BNP 555.2* 450.1*   CBG: Recent Labs  Lab 11/03/17 0653 11/07/17 0611 11/08/17 0559 11/09/17 0700  GLUCAP 103* 117* 108* 107*   Time spent: 35 minutes  Signed:  Berle Mull  [facility]  11/09/2017  , 4:05 PM

## 2017-11-09 NOTE — Consult Note (Signed)
ORTHOPAEDIC CONSULTATION  REQUESTING PHYSICIAN: Lavina Hamman, MD  Chief Complaint: Left knee pain status post fall.  HPI: Raven Gray is a 84 y.o. female who presents with left knee pain and difficulty weightbearing secondary to a fall.  Initial radiographs were concerning for possible femoral condyle fracture and a CT scan was obtained.  Past Medical History:  Diagnosis Date  . Atrial fibrillation ([place])    a. on Xarelto  . Chest pain   . COPD (chronic obstructive pulmonary disease) ([place])    family unsure if is copd,   . H/O echocardiogram    a. 08/2016: EF of 60-65%, severely dilated LA, mild pulmonic regurgitations, mild TR, and PA Pressure at 38 mm Hg  . History of depression   . History of DVT (deep vein thrombosis)   . History of pulmonary embolism   . Hyperlipidemia   . Hypertension   . Hypokalemia    Now improved with treatment  . Shortness of breath   . Stroke ([place])   . Tachy-brady syndrome ([place]) 04/01/2017   s/p MDT PPM    Past Surgical History:  Procedure Laterality Date  . PACEMAKER IMPLANT N/A 04/01/2017   Procedure: Pacemaker Implant;  Surgeon: Sanda Klein, MD;  Location: [facility];  Service: Cardiovascular;  Laterality: N/A; MDT [place] XT SR MRI  . REPLACEMENT TOTAL KNEE    . ROTATOR CUFF REPAIR     Social History   Socioeconomic History  . Marital status: Widowed    Spouse name: None  . Number of children: None  . Years of education: None  . Highest education level: None  Social Needs  . Financial resource strain: None  . Food insecurity - worry: None  . Food insecurity - inability: None  . Transportation needs - medical: None  . Transportation needs - non-medical: None  Occupational History  . None  Tobacco Use  . Smoking status: Never Smoker  . Smokeless tobacco: Never Used  Substance and Sexual Activity  . Alcohol use: No  . Drug use: No  . Sexual activity: No  Other Topics Concern  . None  Social History Narrative  .  None   Family History  Problem Relation Age of Onset  . Alzheimer's disease Mother   . Heart attack Father   . Diabetes type II Daughter   . Heart disease Daughter        stents   - negative except otherwise stated in the family history section No Known Allergies Prior to Admission medications   Medication Sig Start Date End Date Taking? Authorizing Provider  acetaminophen (TYLENOL) 325 MG tablet Take 650 mg by mouth every 6 (six) hours as needed (for pain).    Yes [provider]  ALPRAZolam (XANAX) 0.25 MG tablet Take 1 tablet (0.25 mg total) by mouth 2 (two) times daily as needed for anxiety. 10/21/14  Yes Mikhail, Velta Addison, DO  cephALEXin (KEFLEX) 500 MG capsule Take 1 capsule (500 mg total) by mouth 2 (two) times daily for 7 days. 11/04/17 11/11/17 Yes Nita Sells, MD  furosemide (LASIX) 40 MG tablet Take 1 tablet (40 mg total) by mouth every morning. 01/10/17  Yes Domenic Polite, MD  metoprolol (LOPRESSOR) 25 MG tablet Take 1 tablet (25 mg total) by mouth 2 (two) times daily. 06/20/14  Yes Lorretta Harp, MD  Multiple Vitamins-Minerals (WOMENS 50+ [place] VITAMIN/MIN) TABS Take 1 tablet by mouth daily.   Yes [provider]  omeprazole (PRILOSEC) 20 MG capsule  Take 20 mg by mouth daily as needed (for reflux/heartburn).  04/23/15  Yes [provider]  OXYGEN Inhale 2 L into the lungs continuous.    Yes [provider]  potassium chloride SA (K-DUR,KLOR-CON) 20 MEQ tablet Take 40 mEq by mouth daily.  01/13/17  Yes Isaiah Serge, NP  pravastatin (PRAVACHOL) 40 MG tablet Take 40 mg by mouth every morning.    Yes [provider]  venlafaxine XR (EFFEXOR-XR) 150 MG 24 hr capsule Take 150 mg by mouth daily.   Yes [provider]  venlafaxine XR (EFFEXOR-XR) 75 MG 24 hr capsule Take 1 capsule by mouth daily. 07/14/17  Yes [provider]  zolpidem (AMBIEN) 5 MG tablet Take 5 mg by mouth at bedtime. 11/03/13  Yes [provider]  nitroGLYCERIN (NITROSTAT) 0.4 MG SL tablet Place 0.4 mg under the tongue every 5 (five) minutes as needed for chest pain. X 3 doses 02/08/16   [provider]   Ct Knee Left Wo Contrast  Result Date: 11/08/2017 CLINICAL DATA:  Left knee pain after fall 1 week ago. EXAM: CT OF THE LEFT KNEE WITHOUT CONTRAST TECHNIQUE: Multidetector CT imaging of the left knee was performed according to the standard protocol. Multiplanar CT image reconstructions were also generated. COMPARISON:  Left knee x-rays dated November 06, 2017. FINDINGS: Bones/Joint/Cartilage No acute fracture or dislocation. Severe medial and lateral patellofemoral compartment joint space narrowing with subchondral sclerosis and cystic change. Mild lateral compartment joint space narrowing. Tricompartmental osteophytes. Small suprapatellar joint effusion. Small Baker cyst. Osteopenia. Ligaments Suboptimally assessed by CT. Muscles and Tendons Dystrophic calcifications within the distal quadriceps tendon. The extensor mechanism is intact. No muscle atrophy. Soft tissues Unremarkable. IMPRESSION: 1.  No acute osseous abnormality.  No fracture. 2. Severe medial and patellofemoral compartment osteoarthritis. 3. Small suprapatellar joint effusion.  Small Baker cyst. Electronically Signed   By: Titus Dubin M.D.   On: 11/08/2017 13:09   - pertinent xrays, CT, MRI studies were reviewed and independently interpreted  Positive ROS: All other systems have been reviewed and were otherwise negative with the exception of those mentioned in the HPI and as above.  Physical Exam: General: Alert, no acute distress Psychiatric: Patient is competent for consent with normal mood and affect Lymphatic: No axillary or cervical lymphadenopathy Cardiovascular: No pedal edema Respiratory: No cyanosis, no use of accessory musculature GI: No organomegaly, abdomen is soft and non-tender  Skin: Patient's skin is intact there is no breakdown  no cellulitis.  Images:  @ENCIMAGES @   Neurologic: Patient does have protective sensation bilateral lower extremities.   MUSCULOSKELETAL:  Examination patient's both lower extremities are neurovascularly intact.  There is no angular deformity she does have pain with range of motion of the left knee.  Review of the CT scan left knee shows no evidence of fractures she has severe tricompartmental arthritic changes with subcondylar cysts and sclerosis as well as art periarticular bony spurs and complete collapse of the joint space.  Assessment: Assessment: Severe tricompartment arthritis of the left knee without fracture.  Plan: Plan: We will place her in a knee immobilizer to give her knee some support she may be weightbearing as tolerated I will follow-up in the office in 2 weeks.  Thank you for the consult and the opportunity to see Ms. Tamera Stands, [place] [phone] 9:30 AM

## 2017-11-09 NOTE — Progress Notes (Signed)
Pt refused to stand up to get her weight. Complained that left leg is hurting.

## 2017-11-09 NOTE — Progress Notes (Signed)
Patient has order to discharge to SNF. IV and telemetry removed. Belongings packed. Discharge instructions reviewed with patient and family [name]). Report given to RN at [facility]. Patient stable and awaiting [facility].

## 2017-11-09 NOTE — Progress Notes (Signed)
This morning during rounds pt stated she wanted to get up to the chair. Pt does have order to be up with assistance; per ortho MD LLE is WBAT. Pt has knee immobilizer on and was assisted to chair per pt request with no distress. Family came by later and was upset that pt was in the chair due to left leg being "injured". Education given to family. Pt and family now request to go back to bed; pt assisted back to bed by nursing staff.

## 2017-11-09 NOTE — Clinical Social Work Note (Addendum)
Clinical Social Worker facilitated patient discharge including contacting patient family [name], pt's daughter) and facility to confirm patient discharge plans.  Clinical information faxed to facility and family agreeable with plan.  CSW arranged ambulance transport via [facility] to [facility]  RN to call [phone] for report prior to discharge. Patient will go to room 507.  Clinical Social Worker will sign off for now as social work intervention is no longer needed. Please consult [place] again if new need arises.  [place], [place]

## 2017-11-09 NOTE — Clinical Social Work Placement (Signed)
   CLINICAL SOCIAL WORK PLACEMENT  NOTE  Date:  11/09/2017  Patient Details  Name: Raven Gray MRN: [id number] Date of Birth: 03/07/1928  Clinical Social Work is seeking post-discharge placement for this patient at the [place] level of care (*CSW will initial, date and re-position this form in  chart as items are completed):  Yes   Patient/family provided with [place] Work Department's list of facilities offering this level of care within the geographic area requested by the patient (or if unable, by the patient's family).  Yes   Patient/family informed of their freedom to choose among providers that offer the needed level of care, that participate in Medicare, Medicaid or managed care program needed by the patient, have an available bed and are willing to accept the patient.  Yes   Patient/family informed of [facility]'s ownership interest in [facility] and [facility], as well as of the fact that they are under no obligation to receive care at these facilities.  PASRR submitted to EDS on 11/07/17     PASRR number received on       Existing PASRR number confirmed on       FL2 transmitted to all facilities in geographic area requested by pt/family on 11/07/17     FL2 transmitted to all facilities within larger geographic area on       Patient informed that his/her managed care company has contracts with or will negotiate with certain facilities, including the following:        Yes   Patient/family informed of bed offers received.  Patient chooses bed at [facility]     Physician recommends and patient chooses bed at      Patient to be transferred to [facility] on 11/09/17.  Patient to be transferred to facility by PTAR     Patient family notified on 11/09/17 of transfer.  Name of family member notified:  [name]     PHYSICIAN Please sign FL2     Additional Comment:     _______________________________________________ Eileen Stanford, LCSW 11/09/2017, 4:26 PM

## 2017-11-10 ENCOUNTER — Other Ambulatory Visit: Payer: Self-pay

## 2017-11-10 ENCOUNTER — Telehealth: Payer: Self-pay | Admitting: Cardiovascular Disease

## 2017-11-10 NOTE — Telephone Encounter (Signed)
Returned call to patient's daughter [name].She stated  mother was discharged from [facility] yesterday.She was calling to clarify medication.Advised she is to take lasix 20 mg daily if needed and take kdur 20 meq daily when she takes lasix.Stated she will call back to schedule post hospital appointment.

## 2017-11-10 NOTE — Telephone Encounter (Signed)
Please call,concerning pt's medicine.

## 2017-11-11 DIAGNOSIS — M79605 Pain in left leg: Secondary | ICD-10-CM | POA: Diagnosis not present

## 2017-11-11 DIAGNOSIS — I4891 Unspecified atrial fibrillation: Secondary | ICD-10-CM | POA: Diagnosis not present

## 2017-11-11 DIAGNOSIS — R5381 Other malaise: Secondary | ICD-10-CM | POA: Diagnosis not present

## 2017-11-11 DIAGNOSIS — R55 Syncope and collapse: Secondary | ICD-10-CM | POA: Diagnosis not present

## 2017-11-18 DIAGNOSIS — R05 Cough: Secondary | ICD-10-CM | POA: Diagnosis not present

## 2017-11-18 DIAGNOSIS — R5381 Other malaise: Secondary | ICD-10-CM | POA: Diagnosis not present

## 2017-11-23 ENCOUNTER — Encounter (INDEPENDENT_AMBULATORY_CARE_PROVIDER_SITE_OTHER): Payer: Self-pay | Admitting: Orthopedic Surgery

## 2017-11-23 ENCOUNTER — Ambulatory Visit (INDEPENDENT_AMBULATORY_CARE_PROVIDER_SITE_OTHER): Payer: Medicare Other | Admitting: Orthopedic Surgery

## 2017-11-23 VITALS — Ht 61.0 in | Wt 176.0 lb

## 2017-11-23 DIAGNOSIS — M1712 Unilateral primary osteoarthritis, left knee: Secondary | ICD-10-CM

## 2017-11-23 NOTE — Progress Notes (Signed)
Office Visit Note   Patient: Raven Gray           Date of Birth: July 17, 1928           MRN: [id number] Visit Date: 11/23/2017              Requested by: [name], MD [facility] [place],  [id number] PCP: [name], MD  Chief Complaint  Patient presents with  . Left Knee - Follow-up    ER follow up s/p fall 11/06/17 knee pain      HPI: Patient is an 84 year old woman who was seen in follow-up for tricompartmental arthritis left knee.  She was initially placed in knee immobilizer went to skilled nursing she has been discharged from skilled nursing and is being set up for home health physical therapy.  She is currently in a wheelchair without concerns.  Assessment & Plan: Visit Diagnoses:  1. Unilateral primary osteoarthritis, left knee     Plan: Patient will work on strengthening for her left knee.  Discussed that if she has increasing pain or symptoms we could proceed with a steroid injection.  If her knee becomes unstable recommended using the knee immobilizer to provide her stability for gait training.  Follow-Up Instructions: Return if symptoms worsen or fail to improve.   Ortho Exam  Patient is alert, oriented, no adenopathy, well-dressed, normal affect, normal respiratory effort. Examination patient states she is sleeping she is ambulating in a wheelchair.  Left knee has no redness no cellulitis no bruising.  She is crepitation with range of motion she has range of motion from 20-100 degrees.  Collaterals and cruciates are stable.  Imaging: No results found. No images are attached to the encounter.  Labs: Lab Results  Component Value Date   HGBA1C 6.5 (H) 01/09/2017   HGBA1C 6.2 (H) 09/07/2016   HGBA1C 6.4 (H) 10/20/2014   REPTSTATUS 07/06/2017 FINAL 07/01/2017   CULT NO GROWTH 5 DAYS 07/01/2017    @LABSALLVALUES (HGBA1)@  Body mass index is 33.25 kg/m.  Orders:  No orders of the defined types were placed in this encounter.  No orders  of the defined types were placed in this encounter.    Procedures: No procedures performed  Clinical Data: No additional findings.  ROS:  All other systems negative, except as noted in the HPI. Review of Systems  Objective: Vital Signs: Ht 5\' 1"  (1.549 m)   Wt 176 lb (79.8 kg)   BMI 33.25 kg/m   Specialty Comments:  No specialty comments available.  PMFS History: Patient Active Problem List   Diagnosis Date Noted  . Unilateral primary osteoarthritis, left knee   . Palliative care by specialist   . Epistaxis   . Syncope 11/02/2017  . Atrial fibrillation ([place]) 11/02/2017  . Nasal fracture 11/02/2017  . Fall at home 11/02/2017  . Hypertension 11/02/2017  . COPD with emphysema ([place]) 11/02/2017  . HLD (hyperlipidemia) 11/02/2017  . History of pulmonary embolism 07/21/2017  . Nonsustained ventricular tachycardia ([place]) 07/21/2017  . Healthcare-associated pneumonia 07/01/2017  . Acute respiratory failure with hypoxia ([place]) 07/01/2017  . Acute diastolic heart failure ([place]) 07/01/2017  . Conjunctivitis 07/01/2017  . Sepsis ([place]) 07/01/2017  . HCAP (healthcare-associated pneumonia)   . Acute pulmonary edema (HCC)   . Tachycardia-bradycardia syndrome ([place]) 04/01/2017  . Pacemaker 04/01/2017  . Chronic atrial fibrillation ([place])   . Syncope and collapse   . Loss of consciousness ([place]) 01/08/2017  . Dehydration 11/25/2016  . Acute encephalopathy   .  Encephalopathy 11/24/2016  . Dyspnea on exertion 10/29/2016  . Chronic respiratory failure with hypoxia ([place]) 10/29/2016  . Long term current use of anticoagulant 10/23/2016  . TIA (transient ischemic attack) 09/06/2016  . COPD (chronic obstructive pulmonary disease) ([place]) 09/06/2016  . Essential hypertension 09/06/2016  . GERD (gastroesophageal reflux disease) 09/06/2016  . Depression 09/06/2016  . Hyperlipidemia 10/23/2015  . Aortic insufficiency 12/15/2014  . Edema of both legs 09/19/2014  . Atrial fibrillation with  slow ventricular response ([place]) 11/21/2013  . Chest pain 11/21/2013  . Community acquired pneumonia 06/19/2012  . Pulmonary embolism ([place]) 06/19/2012  . Hypokalemia 06/18/2012  . COPD with acute exacerbation ([place]) 06/18/2012  . Hypoxia 06/18/2012   Past Medical History:  Diagnosis Date  . Atrial fibrillation ([place])    a. on Xarelto  . Chest pain   . COPD (chronic obstructive pulmonary disease) ([place])    family unsure if is copd,   . H/O echocardiogram    a. 08/2016: EF of 60-65%, severely dilated LA, mild pulmonic regurgitations, mild TR, and PA Pressure at 38 mm Hg  . History of depression   . History of DVT (deep vein thrombosis)   . History of pulmonary embolism   . Hyperlipidemia   . Hypertension   . Hypokalemia    Now improved with treatment  . Shortness of breath   . Stroke ([place])   . Tachy-brady syndrome ([place]) 04/01/2017   s/p MDT PPM     Family History  Problem Relation Age of Onset  . Alzheimer's disease Mother   . Heart attack Father   . Diabetes type II Daughter   . Heart disease Daughter        stents    Past Surgical History:  Procedure Laterality Date  . PACEMAKER IMPLANT N/A 04/01/2017   Procedure: Pacemaker Implant;  Surgeon: Sanda Klein, MD;  Location: [facility];  Service: Cardiovascular;  Laterality: N/A; MDT Azure XT SR MRI  . REPLACEMENT TOTAL KNEE    . ROTATOR CUFF REPAIR     Social History   Occupational History  . Not on file  Tobacco Use  . Smoking status: Never Smoker  . Smokeless tobacco: Never Used  Substance and Sexual Activity  . Alcohol use: No  . Drug use: No  . Sexual activity: No

## 2017-11-25 DIAGNOSIS — I480 Paroxysmal atrial fibrillation: Secondary | ICD-10-CM | POA: Diagnosis not present

## 2017-11-25 DIAGNOSIS — I639 Cerebral infarction, unspecified: Secondary | ICD-10-CM | POA: Diagnosis not present

## 2017-11-25 DIAGNOSIS — I10 Essential (primary) hypertension: Secondary | ICD-10-CM | POA: Diagnosis not present

## 2017-11-27 ENCOUNTER — Encounter: Payer: Self-pay | Admitting: Cardiovascular Disease

## 2017-11-27 ENCOUNTER — Ambulatory Visit: Payer: Medicare Other | Admitting: Cardiovascular Disease

## 2017-11-27 DIAGNOSIS — I10 Essential (primary) hypertension: Secondary | ICD-10-CM

## 2017-11-27 DIAGNOSIS — E7800 Pure hypercholesterolemia, unspecified: Secondary | ICD-10-CM | POA: Diagnosis not present

## 2017-11-27 DIAGNOSIS — I482 Chronic atrial fibrillation, unspecified: Secondary | ICD-10-CM

## 2017-11-27 DIAGNOSIS — J449 Chronic obstructive pulmonary disease, unspecified: Secondary | ICD-10-CM | POA: Diagnosis not present

## 2017-11-27 NOTE — Assessment & Plan Note (Signed)
History of hypertension blood pressure 135/65. She is on metoprolol. Continue current meds at current dosing.

## 2017-11-27 NOTE — Assessment & Plan Note (Signed)
History of chronic A. fib no longer on oral anticoagulation because of fall risk.

## 2017-11-27 NOTE — Patient Instructions (Signed)
Medication Instructions: Yo  Follow-Up: We request that you follow-up in: 3 months with an extender and in 6 months with Dr Andria Rhein will receive a reminder letter in the mail two months in advance. If you don't receive a letter, please call our office to schedule the follow-up appointment.  If you need a refill on your cardiac medications before your next appointment, please call your pharmacy.

## 2017-11-27 NOTE — Progress Notes (Signed)
11/27/2017 Raven Gray   Aug 06, 1928  [id number]  Primary Physician Raven Gravel, MD Primary Cardiologist: Raven Harp MD Raven Gray, [place]  HPI:  Raven Gray is a 84 y.o.   moderately overweight married Caucasian female mother of 4 daughters, grandmother to 32 grandchildren was accompanied by one of her daughters Raven Gray today. I last saw her in the office 09/25/17.. She unfortunately lost her oldest daughter June 2016 2 diabetes.She was referred by Raven Gray for new-onset A. Fib and chest pain. The patient has a remote history of a pulmonary embolism and DVT on Xarelto oral autoregulation. Problems include hypertension, hyperlipidemia and COPD. She does have a family history of heart disease in the father who died of an MI. She does have a daughter who's had stents. She has never had a heart attack or stroke. Over the last month she said it was a chest pain which awakened her from sleep. She also feels weak and sluggish. An EKG performed by her primary care physician revealed atrial fibrillation which was newly recognized. I performed a 2-D echo cardiogram which was normal as was a Myoview stress test. She has had several ER visits/admissions for atypical chest pain and shortness of breath. A recent 2-D echo performed 10/21/14 revealed normal LV size and function, moderate aortic insufficiency with a mildly dilated left atrium. We have discussed the possibility of outpatient cardioversion however the patient does not wish to pursue this at this time. Because of ongoing chest pain she had a Myoview stress test performed 02/22/16 which was low risk. Because of ongoing chest pain which does have ischemic discuss the possibility of performing outpatient cardiac catheterization which the patient currently does not wish to pursue. As result of that I elected to increase her Imdur from 30-60 mg a day and will follow her clinically closely as an outpatient. I performed event monitoring which showed A. fib with  long pauses. She also had a permanent transvenous pacemaker placed by Dr. Sallyanne Gray 04/01/17. She does get occasional chest pain but does not wish to have any further testing for this nor does she wish to have anything interventional done.  She was admitted to the hospital on 2/15 with a fall. She apparently injured her nose. It was decided at that time to discontinue oral hydration because of ongoing fall risk. She is currently being cared for by her 2 daughters.    Current Meds  Medication Sig  . acetaminophen (TYLENOL) 325 MG tablet Take 650 mg by mouth every 6 (six) hours as needed (for pain).   . furosemide (LASIX) 20 MG tablet Take 1 tablet (20 mg total) by mouth daily as needed for fluid or edema.  . metoprolol (LOPRESSOR) 25 MG tablet Take 1 tablet (25 mg total) by mouth 2 (two) times daily.  . Multiple Vitamins-Minerals (WOMENS 50+ MULTI VITAMIN/MIN) TABS Take 1 tablet by mouth daily.  . nitroGLYCERIN (NITROSTAT) 0.4 MG SL tablet Place 0.4 mg under the tongue every 5 (five) minutes as needed for chest pain. X 3 doses  . omeprazole (PRILOSEC) 20 MG capsule Take 20 mg by mouth daily as needed (for reflux/heartburn).   . OXYGEN Inhale 2 L into the lungs continuous.   . potassium chloride SA (K-DUR,KLOR-CON) 20 MEQ tablet Take 1 tablet (20 mEq total) by mouth daily as needed (along with lasix.).  Marland Kitchen pravastatin (PRAVACHOL) 40 MG tablet Take 40 mg by mouth every morning.   . Rivaroxaban (XARELTO) 15 MG TABS tablet  Take 15 mg by mouth 2 (two) times daily with a meal. samples  . venlafaxine XR (EFFEXOR-XR) 150 MG 24 hr capsule Take 150 mg by mouth daily.  Marland Kitchen venlafaxine XR (EFFEXOR-XR) 75 MG 24 hr capsule Take 1 capsule by mouth daily.  Marland Kitchen zolpidem (AMBIEN) 5 MG tablet Take 5 mg by mouth at bedtime.     No Known Allergies  Social History   Socioeconomic History  . Marital status: Widowed    Spouse name: Not on file  . Number of children: Not on file  . Years of education: Not on file  .  Highest education level: Not on file  Social Needs  . Financial resource strain: Not on file  . Food insecurity - worry: Not on file  . Food insecurity - inability: Not on file  . Transportation needs - medical: Not on file  . Transportation needs - non-medical: Not on file  Occupational History  . Not on file  Tobacco Use  . Smoking status: Never Smoker  . Smokeless tobacco: Never Used  Substance and Sexual Activity  . Alcohol use: No  . Drug use: No  . Sexual activity: No  Other Topics Concern  . Not on file  Social History Narrative  . Not on file     Review of Systems: General: negative for chills, fever, night sweats or weight changes.  Cardiovascular: negative for chest pain, dyspnea on exertion, edema, orthopnea, palpitations, paroxysmal nocturnal dyspnea or shortness of breath Dermatological: negative for rash Respiratory: negative for cough or wheezing Urologic: negative for hematuria Abdominal: negative for nausea, vomiting, diarrhea, bright red blood per rectum, melena, or hematemesis Neurologic: negative for visual changes, syncope, or dizziness All other systems reviewed and are otherwise negative except as noted above.    Blood pressure 135/65, pulse 63, height 5\' 1"  (1.549 m), weight 173 lb (78.5 kg).  General appearance: alert and no distress Neck: no adenopathy, no carotid bruit, no JVD, supple, symmetrical, trachea midline and thyroid not enlarged, symmetric, no tenderness/mass/nodules Lungs: clear to auscultation bilaterally Heart: irregularly irregular rhythm Extremities: extremities normal, atraumatic, no cyanosis or edema Pulses: 2+ and symmetric Skin: Skin color, texture, turgor normal. No rashes or lesions Neurologic: Alert and oriented X 3, normal strength and tone. Normal symmetric reflexes. Normal coordination and gait  EKG not performed today  ASSESSMENT AND PLAN:   Hyperlipidemia History of hyperlipidemia with lipid profile performed  4/20/18Total cholesterol 1:15 and LDL 58 and HDL of 40 on statin therapy  Essential hypertension History of hypertension blood pressure 135/65. She is on metoprolol. Continue current meds at current dosing.  Chronic atrial fibrillation (HCC) History of chronic A. fib no longer on oral anticoagulation because of fall risk.      Raven Harp MD FACP,FACC,FAHA, [facility] 11/27/2017 11:17 AM

## 2017-11-27 NOTE — Assessment & Plan Note (Signed)
History of hyperlipidemia with lipid profile performed 4/20/18Total cholesterol 1:15 and LDL 58 and HDL of 40 on statin therapy

## 2017-12-04 DIAGNOSIS — I10 Essential (primary) hypertension: Secondary | ICD-10-CM | POA: Diagnosis not present

## 2017-12-04 DIAGNOSIS — I639 Cerebral infarction, unspecified: Secondary | ICD-10-CM | POA: Diagnosis not present

## 2017-12-04 DIAGNOSIS — Z950 Presence of cardiac pacemaker: Secondary | ICD-10-CM | POA: Diagnosis not present

## 2017-12-04 DIAGNOSIS — J449 Chronic obstructive pulmonary disease, unspecified: Secondary | ICD-10-CM | POA: Diagnosis not present

## 2017-12-04 DIAGNOSIS — M170 Bilateral primary osteoarthritis of knee: Secondary | ICD-10-CM | POA: Diagnosis not present

## 2017-12-04 DIAGNOSIS — R269 Unspecified abnormalities of gait and mobility: Secondary | ICD-10-CM | POA: Diagnosis not present

## 2017-12-04 DIAGNOSIS — M6281 Muscle weakness (generalized): Secondary | ICD-10-CM | POA: Diagnosis not present

## 2017-12-07 ENCOUNTER — Encounter: Payer: Self-pay | Admitting: *Deleted

## 2017-12-07 ENCOUNTER — Other Ambulatory Visit: Payer: Self-pay | Admitting: *Deleted

## 2017-12-07 DIAGNOSIS — Z950 Presence of cardiac pacemaker: Secondary | ICD-10-CM | POA: Diagnosis not present

## 2017-12-07 DIAGNOSIS — I10 Essential (primary) hypertension: Secondary | ICD-10-CM | POA: Diagnosis not present

## 2017-12-07 DIAGNOSIS — J449 Chronic obstructive pulmonary disease, unspecified: Secondary | ICD-10-CM | POA: Diagnosis not present

## 2017-12-07 DIAGNOSIS — I639 Cerebral infarction, unspecified: Secondary | ICD-10-CM | POA: Diagnosis not present

## 2017-12-07 DIAGNOSIS — M170 Bilateral primary osteoarthritis of knee: Secondary | ICD-10-CM | POA: Diagnosis not present

## 2017-12-07 DIAGNOSIS — R269 Unspecified abnormalities of gait and mobility: Secondary | ICD-10-CM | POA: Diagnosis not present

## 2017-12-07 DIAGNOSIS — M6281 Muscle weakness (generalized): Secondary | ICD-10-CM | POA: Diagnosis not present

## 2017-12-07 NOTE — Patient Outreach (Signed)
[facility]) Care Management [place], Transition of Care day 1  12/07/2017  Raven Gray 1928/07/11 [id number]  Successful telephone outreach to Raven Gray, daughter/ caregiver/ HCPOA, on [facility] CM written consent, for [facility], 84 y/o female referred to [facility] for transition of care after two recent hospitalizations for syncope.  Patient has history including, but not limited to, COPD; A-fib not on ACT due to high falls risk; tachy-brady syndrome with pacemaker in 2018; HTN/ HLD; Aortic insufficiency; previous CVA; GERD; and depression.  HIPAA/ identity verified with patient's caregiver during phone call today, and caregiver again provides verbal consent for [place] involvement in patient's care.  Recent hospitalizations include:  February 11-13, 2019 for syncope/ fall with nasal fracture; patient was discharged home with home health services in place February 15-18, 2019 for syncope with fall; patient was discharged to SNF for rehabilitation; confirmation of SNF discharge was received Monday December 07, 2017  Today, caregiver reports that patient "is doing a little better" post-SNF discharge; reports that patient was discharged from SNF "about 2 weeks ago;" explained to caregiver that I had just received confirmation of patient's discharge form SNF today.  Caregiver states that she is "not sure" if patient will need [facility] CM services, but states, that she "will take all the help she can get."  Caregiver denies that patient is in distress, and that patient has experienced new or recent falls post- SNF discharge.  Caregiver further reports:  Medications: -- Has all medicationsand takes as prescribed;denies questions about current medications.  -- caregiver manages medications for patient and provides medications to patient when they are due -- denies issues with swallowing medications -- unable to complete medication reconciliation  today, as she is not at patient's home where medications are; caregiver reports patient is currently visiting with her at caregiver's home while caregiver is also caring for her grandchildren  Home health [facility]) services: -- [facility] services for PT in place and "are supposed to start this week three times a week" -- confirms that [facility] services have been scheduled for later today; confirms that she has phone numbers for home health team/ agency  Provider appointments: -- All recent and upcoming provider appointments were reviewed with caregiver today -- reports recently attended PCP and cardiology provider office visits as scheduled  Safety/ Mobility/ Falls: -- denies new/ recent falls post- SNF discharge -- assistive devices: "uses walker or wheelchair all the time." -- general fall risks/ prevention education discussed with patient's caregiver today  Social/ Community Resource needs: -- caregiver currently denies community resource needs, stating supportive family members that assist with care needs of patient as indicated -- family provides transportation for patient to all provider appointments, errands, etc -- caregiver states that when patient was discharged home from SNF she was staying with caregiver at caregiver's home "all the time;" states that patient is now staying by herself at her home, with daily check-in visits from either caregiver or caregiver's sister, who both live "5 minutes from patient"  Advanced Directive (AD) Planning:   --reports has exisisting AD in place for HCPOA; declined that patient would desire to make changes to current AD's  Self-health management of 2 recent syncopal episodes requiring hospitalization: -- caregiver reports that she checks patient's BP and SaO2 levels "several times each day," and reports that she has no concerns around patient's recent values for same -- patient using home O2 at 2 L/min via [place], "only as needed," mostly at  night; reports patient has  had no difficulty around her breathing post-SNF discharge -- denied that patient has had episodes dizziness/ pre-syncope post SNF-discharge -- provided positive reinforcement that caregiver has been monitoring/ recording patient's BP's and encouraged her to continue; caregiver agrees to do so.  Caregiver denies further issues, concerns, or problems today.  I confirmed that caregiver has my direct phone number, the main [facility] CM office phone number, and the [facility] CM 24-hour nurse advice phone number should issues arise prior to next scheduled [place] outreach by phone next week and initial home visit in 2 weeks.  Encouraged caregiver to contact me directly if needs, questions, issues, or concerns arise prior to next scheduled outreach; she agreed to do so.  Plan:  Patient will take medications as prescribed and will attend all scheduled provider appointments post-SNF discharge  Patient will continue using assistive devices for ambulation  Patient will actively participate in home health services as ordered post-SNF discharge  Caregiver will continue monitoring and recording patient's daily blood pressures and SaO2 levels  Patient/ and or caregiver will promptly notify care providers for any new concerns, problems, issues that arise  [place] CM outreach for transition of care to continue with scheduled telephone outreach next week and initial home visit in 2 weeks   [facility] CM Care Plan Problem One     Most Recent Value  Care Plan Problem One  Risk for hospital readmission, related to/ as evidenced by 2 recent hospital visits for syncope with discharge for rehabilitation to SNF   Role Documenting the Problem One  Care Management Coordinator  Care Plan for Problem One  Active  [facility] Long Term Goal   Over the next 31 days, patient will not experience hospital readmission as evidenced by patient/ caregiver reporting and review of EMR during [facility] RN CCM outreach  [facility] Long Term Goal Start  Date  12/07/17 [Goal re-established around patient's recent SNF DC]  Interventions for Problem One Long Term Goal  Discussed with patient's caregiver her recent discharge from SNF for rehabiliatation, post 2 recent hospitalizations for syncope,  discussed recent provider office visits, patient's current clinical status,  scheduled initial [facility] RN CCM home visit,  TOC program initiated  [facility] CM Short Term Goal #1   Over the next 30 days, patient will actively participate in home health services for PT as ordered post-SNF discharge, as evidenced by patient/ caregiver reporting and collaboration with [facility] team as indicated  [facility] CM Short Term Goal #1 Start Date  12/07/17  Interventions for Short Term Goal #1  Discussed with patient's caregiver current home health services in place,  confirmed that patient is actively participating in home health services and that caregiver has contact information for home health agency  [facility] CM Short Term Goal #2   Over the next 30 days, patient and caregiver will meet with [facility] RN CCM for initial home visit/ fall evaluation, as evidenced by successful completion of [facility] RN CCM initial home visit  [facility] CM Short Term Goal #2 Start Date  12/07/17  Interventions for Short Term Goal #2  Discussed with patient's caregiver [facility] CM services and scheduled initial home visit with caregiver     I appreciate the opportunity to participate in [place] care,  Oneta Rack, RN, BSN, [employer] Coordinator [facility] Care Management  [phone]

## 2017-12-08 DIAGNOSIS — M170 Bilateral primary osteoarthritis of knee: Secondary | ICD-10-CM | POA: Diagnosis not present

## 2017-12-08 DIAGNOSIS — M6281 Muscle weakness (generalized): Secondary | ICD-10-CM | POA: Diagnosis not present

## 2017-12-08 DIAGNOSIS — I639 Cerebral infarction, unspecified: Secondary | ICD-10-CM | POA: Diagnosis not present

## 2017-12-08 DIAGNOSIS — Z950 Presence of cardiac pacemaker: Secondary | ICD-10-CM | POA: Diagnosis not present

## 2017-12-08 DIAGNOSIS — I10 Essential (primary) hypertension: Secondary | ICD-10-CM | POA: Diagnosis not present

## 2017-12-08 DIAGNOSIS — J449 Chronic obstructive pulmonary disease, unspecified: Secondary | ICD-10-CM | POA: Diagnosis not present

## 2017-12-08 DIAGNOSIS — R269 Unspecified abnormalities of gait and mobility: Secondary | ICD-10-CM | POA: Diagnosis not present

## 2017-12-09 DIAGNOSIS — R269 Unspecified abnormalities of gait and mobility: Secondary | ICD-10-CM | POA: Diagnosis not present

## 2017-12-09 DIAGNOSIS — I639 Cerebral infarction, unspecified: Secondary | ICD-10-CM | POA: Diagnosis not present

## 2017-12-09 DIAGNOSIS — Z950 Presence of cardiac pacemaker: Secondary | ICD-10-CM | POA: Diagnosis not present

## 2017-12-09 DIAGNOSIS — M6281 Muscle weakness (generalized): Secondary | ICD-10-CM | POA: Diagnosis not present

## 2017-12-09 DIAGNOSIS — I10 Essential (primary) hypertension: Secondary | ICD-10-CM | POA: Diagnosis not present

## 2017-12-09 DIAGNOSIS — J449 Chronic obstructive pulmonary disease, unspecified: Secondary | ICD-10-CM | POA: Diagnosis not present

## 2017-12-09 DIAGNOSIS — M170 Bilateral primary osteoarthritis of knee: Secondary | ICD-10-CM | POA: Diagnosis not present

## 2017-12-10 DIAGNOSIS — M170 Bilateral primary osteoarthritis of knee: Secondary | ICD-10-CM | POA: Diagnosis not present

## 2017-12-10 DIAGNOSIS — J449 Chronic obstructive pulmonary disease, unspecified: Secondary | ICD-10-CM | POA: Diagnosis not present

## 2017-12-10 DIAGNOSIS — I639 Cerebral infarction, unspecified: Secondary | ICD-10-CM | POA: Diagnosis not present

## 2017-12-10 DIAGNOSIS — M6281 Muscle weakness (generalized): Secondary | ICD-10-CM | POA: Diagnosis not present

## 2017-12-10 DIAGNOSIS — Z950 Presence of cardiac pacemaker: Secondary | ICD-10-CM | POA: Diagnosis not present

## 2017-12-10 DIAGNOSIS — R269 Unspecified abnormalities of gait and mobility: Secondary | ICD-10-CM | POA: Diagnosis not present

## 2017-12-10 DIAGNOSIS — I10 Essential (primary) hypertension: Secondary | ICD-10-CM | POA: Diagnosis not present

## 2017-12-14 ENCOUNTER — Encounter: Payer: Self-pay | Admitting: *Deleted

## 2017-12-14 ENCOUNTER — Other Ambulatory Visit: Payer: Self-pay | Admitting: *Deleted

## 2017-12-14 DIAGNOSIS — M6281 Muscle weakness (generalized): Secondary | ICD-10-CM | POA: Diagnosis not present

## 2017-12-14 DIAGNOSIS — J449 Chronic obstructive pulmonary disease, unspecified: Secondary | ICD-10-CM | POA: Diagnosis not present

## 2017-12-14 DIAGNOSIS — I639 Cerebral infarction, unspecified: Secondary | ICD-10-CM | POA: Diagnosis not present

## 2017-12-14 DIAGNOSIS — I10 Essential (primary) hypertension: Secondary | ICD-10-CM | POA: Diagnosis not present

## 2017-12-14 DIAGNOSIS — R269 Unspecified abnormalities of gait and mobility: Secondary | ICD-10-CM | POA: Diagnosis not present

## 2017-12-14 DIAGNOSIS — M170 Bilateral primary osteoarthritis of knee: Secondary | ICD-10-CM | POA: Diagnosis not present

## 2017-12-14 DIAGNOSIS — Z950 Presence of cardiac pacemaker: Secondary | ICD-10-CM | POA: Diagnosis not present

## 2017-12-14 NOTE — Patient Outreach (Signed)
[facility]) Care Management [place], Transition of Care day 8  12/14/2017  Raven Gray [birth year] [id number]  Successful telephone outreach to Raven Gray, daughter/ caregiver/ HCPOA, on [facility] CM written consent, for [facility], 84 y/o female referred to [place] for transition of care after two recent hospitalizations for syncope.  Patient has history including, but not limited to, COPD; A-fib not on ACT due to high falls risk; tachy-brady syndrome with pacemaker in 2018; HTN/ HLD; Aortic insufficiency; previous CVA; GERD; and depression.  HIPAA/ identity verified with patient's caregiver during phone call today.  Recent hospitalizations include:  February 11-13, 2019 for syncope/ fall with nasal fracture; patient was discharged home with home health services in place February 15-18, 2019 for syncope with fall; patient was discharged to SNF for rehabilitation; confirmation of SNF discharge was received Monday December 07, 2017  Today, caregiver reports that patient "is doing pretty good" post-SNF discharge; denies that patient is in distress, and that patient has experienced new or recent falls post- SNF discharge/ last [facility] RN CCM outreach.  Reports patient continues staying by herself at her own home, with frequent and daily visits from family members, as daughter's both live nearby, "within 5 minutes" of patient's residence.  Reports that patient "gets lonely at night," but has continued insisting that she live alone in her own home "as long as she can."  Caregiver further reports: -- home health services for PT/ OT have started, as of last week, and patient is actively participating in both disciplines; reports home health PT will be visiting patient three times per week and OT one time per week -- patient continues using walker "all the time," at home; "wheelchair" when patient goes out; patient has life-alert system which she wears at all  times -- Meals on Wheels have been re-established and patient will be getting meals delivered daily, starting today; patient's daughter prepares all other meals for patient, so they are available for patient when she gets ready to eat; patient prepares these ready- meals in microwave independently -- caregiver continues checking patient's BP and SaO2 levels "as needed, sometimes several times each day," and reports that she has no concerns around patient's recent values for same -- patient continues using home O2 at 2 L/min via [place], "only as needed," mostly at night; reports patient has had no difficulty around her breathing post-SNF discharge -- denies that patient has had episodes dizziness/ pre-syncope post SNF-discharge  Caregiver denies further issues, concerns, or problems today. I confirmed that caregiver hasmy direct phone number, the main [place] CM office phone number, and the [facility] CM 24-hour nurse advice phone number should issues arise prior to next scheduled [place] outreach with initial home visit next week.  Encouraged caregiver to contact me directly if needs, questions, issues, or concerns arise prior to next scheduled outreach; she agreed to do so.  Plan:  Patient will take medications as prescribed and will attend all scheduled provider appointments post-SNF discharge  Patient will continue using assistive devices for ambulation  Patient will actively participate in home health services as ordered post-SNF discharge  Caregiver will continue monitoring and recording patient's daily blood pressures and SaO2 levels  Patient/ and or caregiver will promptly notify care providers for any new concerns, problems, issues that arise  [place] CM outreach for transition of care to continue with scheduled home visit in next week  [facility] CM Care Plan Problem One  Most Recent Value  Care Plan Problem One  Risk for hospital readmission, related to/ as evidenced by 2  recent hospital visits for syncope with discharge for rehabilitation to SNF   Role Documenting the Problem One  Care Management [place] for Problem One  Active  [facility] Long Term Goal   Over the next 31 days, patient will not experience hospital readmission as evidenced by patient/ caregiver reporting and review of EMR during [facility] RN CCM outreach  [place] Term Goal Start Date  12/07/17 [Goal re-established around patient's recent SNF DC]  Interventions for Problem One Long Term Goal  Discussed with patient's caregiver patient's current clinical status,  confirmed that caregiver has no concerns with patient's current state of well-being, and that patient has experienced no falls since last [facility] RN CCM outreach,  discussed patient's current living situation with caregiver  [facility] CM Short Term Goal #1   Over the next 30 days, patient will actively participate in home health services for PT as ordered post-SNF discharge, as evidenced by patient/ caregiver reporting and collaboration with [facility] team as indicated  [facility] CM Short Term Goal #1 Start Date  12/07/17  Interventions for Short Term Goal #1  Confirmed with caregiver that recently ordered home health services are in place, and that patient is actively participating in services,  confirmed that caregiver has telephone numbers for home health agency,  discussed home health schedule with caregiver  [facility] CM Short Term Goal #2   Over the next 30 days, patient and caregiver will meet with [facility] RN CCM for initial home visit/ fall evaluation, as evidenced by successful completion of [facility] RN CCM initial home visit  [facility] CM Short Term Goal #2 Start Date  12/07/17  Interventions for Short Term Goal #2  Confirmed with caregiver next week's (previously) scheduled [facility] CM home visit with patient     Oneta Rack, RN, BSN, [employer] Coordinator [facility] Care Management  [phone]

## 2017-12-15 DIAGNOSIS — J449 Chronic obstructive pulmonary disease, unspecified: Secondary | ICD-10-CM | POA: Diagnosis not present

## 2017-12-15 DIAGNOSIS — I10 Essential (primary) hypertension: Secondary | ICD-10-CM | POA: Diagnosis not present

## 2017-12-15 DIAGNOSIS — Z950 Presence of cardiac pacemaker: Secondary | ICD-10-CM | POA: Diagnosis not present

## 2017-12-15 DIAGNOSIS — M170 Bilateral primary osteoarthritis of knee: Secondary | ICD-10-CM | POA: Diagnosis not present

## 2017-12-15 DIAGNOSIS — M6281 Muscle weakness (generalized): Secondary | ICD-10-CM | POA: Diagnosis not present

## 2017-12-15 DIAGNOSIS — R269 Unspecified abnormalities of gait and mobility: Secondary | ICD-10-CM | POA: Diagnosis not present

## 2017-12-15 DIAGNOSIS — I639 Cerebral infarction, unspecified: Secondary | ICD-10-CM | POA: Diagnosis not present

## 2017-12-17 DIAGNOSIS — R269 Unspecified abnormalities of gait and mobility: Secondary | ICD-10-CM | POA: Diagnosis not present

## 2017-12-17 DIAGNOSIS — J449 Chronic obstructive pulmonary disease, unspecified: Secondary | ICD-10-CM | POA: Diagnosis not present

## 2017-12-17 DIAGNOSIS — Z950 Presence of cardiac pacemaker: Secondary | ICD-10-CM | POA: Diagnosis not present

## 2017-12-17 DIAGNOSIS — M6281 Muscle weakness (generalized): Secondary | ICD-10-CM | POA: Diagnosis not present

## 2017-12-17 DIAGNOSIS — M170 Bilateral primary osteoarthritis of knee: Secondary | ICD-10-CM | POA: Diagnosis not present

## 2017-12-17 DIAGNOSIS — I639 Cerebral infarction, unspecified: Secondary | ICD-10-CM | POA: Diagnosis not present

## 2017-12-17 DIAGNOSIS — I10 Essential (primary) hypertension: Secondary | ICD-10-CM | POA: Diagnosis not present

## 2017-12-18 DIAGNOSIS — M6281 Muscle weakness (generalized): Secondary | ICD-10-CM | POA: Diagnosis not present

## 2017-12-18 DIAGNOSIS — R269 Unspecified abnormalities of gait and mobility: Secondary | ICD-10-CM | POA: Diagnosis not present

## 2017-12-18 DIAGNOSIS — Z950 Presence of cardiac pacemaker: Secondary | ICD-10-CM | POA: Diagnosis not present

## 2017-12-22 ENCOUNTER — Encounter: Payer: Self-pay | Admitting: *Deleted

## 2017-12-22 ENCOUNTER — Other Ambulatory Visit: Payer: Self-pay | Admitting: *Deleted

## 2017-12-22 DIAGNOSIS — I10 Essential (primary) hypertension: Secondary | ICD-10-CM | POA: Diagnosis not present

## 2017-12-22 DIAGNOSIS — I639 Cerebral infarction, unspecified: Secondary | ICD-10-CM | POA: Diagnosis not present

## 2017-12-22 DIAGNOSIS — M170 Bilateral primary osteoarthritis of knee: Secondary | ICD-10-CM | POA: Diagnosis not present

## 2017-12-22 DIAGNOSIS — R269 Unspecified abnormalities of gait and mobility: Secondary | ICD-10-CM | POA: Diagnosis not present

## 2017-12-22 DIAGNOSIS — Z950 Presence of cardiac pacemaker: Secondary | ICD-10-CM | POA: Diagnosis not present

## 2017-12-22 DIAGNOSIS — M6281 Muscle weakness (generalized): Secondary | ICD-10-CM | POA: Diagnosis not present

## 2017-12-22 DIAGNOSIS — J449 Chronic obstructive pulmonary disease, unspecified: Secondary | ICD-10-CM | POA: Diagnosis not present

## 2017-12-22 NOTE — Patient Outreach (Signed)
[facility]) Care Management  [place] initial Home Visit, Transition of Care day 16  12/22/2017  Raven Gray [birth year] [id number]  Raven Gray is a 84 y.o. female referred to [place] for transition of care after two recent hospitalizations for syncope resulting in mechanical fall with injury.Patient has history including, but not limited to, COPD; A-fib not on ACT due to high falls risk; tachy-brady syndrome with pacemaker in 2018; HTN/ HLD; Aortic insufficiency; previous CVA; GERD; and depression. HIPAA/ identity verified with patient in person at her home today, and her daughter/ Gwenyth Bender, on [facility] CM written consent is also present for home visit, and actively participates in all aspects of visit.  During our visit today, patient also had visit from home health PT.  Pleasant 110 minute visit.  Recent hospitalizations include: February 11-13, 2019 for syncope/ fall with nasal fracture; patient was discharged home with home health services in place February 15-18, 2019 for syncope with fall; patient was discharged to [facility] rehabilitation; confirmation of SNF discharge was received Monday December 07, 2017  Today, patient reports that she is doing "real good," and she denies pain, concerns/ problems.  Patient is in no distress during entirety of today's home visit, and she smiles and jokes throughout our time together; patient tells me all about her birthday party last night with her extended family.  Subjective: "I do pretty good until night time, and then I get lonely; I still want to live in my home, and I know my family loves me and is right here if I need them."  Assessment:  Raven Gray appears to be recuperating well after her 2 recent hospitalizations and SNF/ rehabilitation visit.  She has not had any more falls or syncopal episodes, but admits to occasional dizziness, which she reports is spontaneously resolved without personal intervention.  Raven Gray remains a  high fall risk, and uses her walker at all times.  In addition to receiving daily meals on wheels, Raven Gray has a very supportive extended family who live nearby and actively participate in her daily care.  Raven Gray acknowledges that she requires assistance with her daily care, but wishes to remain at her home as long as possible.  Raven Gray and her caregivers could benefit from ongoing reinforcement of self-health management of chronic disease states of A-Fib and CHF.  Patient/ Caregiver further reports: -- home health services for PT/ OT actively in progress, "going well;" during our visit, patient had visit from home health PT and demontrated active participation in PT session.  Caregiver reports that home health services are expected to continue for several more weeks; visits currently occurring 2-3 times per week. -- Medications: patient has and takes all medications as prescribed; caregiver prepares medications for patient using weekly pill planner box, and patient takes independently once medications are in pill box.  Denies issues with swallowing medications.  Caregiver verbalizes accurate understanding of the purpose, dosing, and scheduling of medications.  Patient was recently discharged from hospital and all medications were thoroughly reviewed with caregiver today with patient listening. -- Falls:  patient continues using walker "all the time," at home; "wheelchair" when patient goes out; patient also has life-alert system which she wears at all times and is wearing today.  Patient demonstrates steady, purposeful gait with ambulation around her home, using walker; One fall hazard/ risk noted: home O2 tubing lying on floor; patient verbalizes understanding that this is a fall risk, and she is noticeably careful when walking around tubing.  No other fall risks/ hazards noted in patient's home environment -- Raven Gray needs: patient currently active with daily Meals on Wheels; patient and caregiver both  deny further community resource needs, again citing very active support from patient's extended family members, who take turns caring for patient. --Self-health management of chronic disease state of A-Fib and CHF: ----Caregiver continues monitoring/ recording patient's BP and SaO2 levels every day; these values were reviewed with patient and caregiver today, all recorded values are within normal limits ---- patient continues using home O2 at 2 L/min via [place], "only as needed," "mostly at night;" patient denies recent episodes shortness of breath or difficulty breathing ---- patient denies ongoing dizziness, but admits that she has experienced dizziness "a few times," that she reports today spontaneously resolve with personal intervention; states that she "just sits down and rests and "they just go away." ---- provided and thoroughly reviewed with patient and her caregiver printed educational material on syncope, A-Fib and CHF zones along with corresponding action plans ---- discussed with patient and caregiver need for patient to sit/ lie down if she requires prn NTG; discussed with patient value of moving slowly/ deliberately with position changes ---- caregiver not currently monitoring and recording patient's daily weights; states "we used to" but reports she did not know that this was beneficial ongoing practice; discussed rationale for weight gain guidelines in setting of CHF, and encouraged patient and caregiver to resume this practice; discussed patient's current use of prn lasix with caregiver and patient -- patient reported today that she "sometimes" is able to feel her "heart racing;" reports that when this happens, she usually "gets up and walks around the house;" encouraged patient to cease getting up and ambulating during these episodes and explained to she and caregiver how A-Fib works/ may pose fall hazard with ambulation during those episodes; encouraged patient to rest; breathe slowly and to  gently/ conservatively try coughing.   ---- thoroughly discussed with patient and caregiver reasons to contact EMS/ go to ED/ seek urgent-emergent care  Patient and caregiverboth deny further issues, concerns, or problems today. I confirmed thatpatient/ caregiverhasmy direct phone number, the main [facility] CM office phone number, and the [facility] CM 24-hour nurse advice phone number should issues arise prior to next scheduled [place] telephone call next week. Encouraged patient/ caregiverto contact me directly if needs, questions, issues, or concerns arise prior to next scheduled outreach; both agreed to do so.  Objective:    BP 136/78   Pulse 62   Resp 16   SpO2 96% Comment: Room Air  Review of Systems  Constitutional: Positive for malaise/fatigue. Negative for fever.  Respiratory: Negative.  Negative for cough, sputum production, shortness of breath and wheezing.   Cardiovascular: Positive for leg swelling. Negative for chest pain.       Bilateral +2 LE edema/ ankles/ feet  Gastrointestinal: Negative.  Negative for abdominal pain and nausea.  Genitourinary: Negative.   Musculoskeletal: Negative.  Negative for falls.  Neurological: Negative.  Negative for dizziness.       Denies dizziness today; reports "sometimes" dizzy  Psychiatric/Behavioral: Negative.  Negative for depression. The patient is not nervous/anxious.        Denies depression; reports gets "lonely and sometimes sad" at night   Physical Exam  Constitutional: She is oriented to person, place, and time. She appears well-developed and well-nourished. No distress.  Cardiovascular: Normal rate, regular rhythm, normal heart sounds and intact distal pulses.  Pulses:  Radial pulses are 2+ on the right side, and 2+ on the left side.       Dorsalis pedis pulses are 2+ on the right side, and 2+ on the left side.  Respiratory: Effort normal and breath sounds normal. No respiratory distress. She has no  wheezes. She has no rales.  Uses O2 at home prn @ 2L/min via [place]-- not wearing during home visit today  GI: Soft. Bowel sounds are normal.  Musculoskeletal: She exhibits edema.  See ROS  Neurological: She is alert and oriented to person, place, and time.  Skin: Skin is warm and dry. No erythema.  Psychiatric: She has a normal mood and affect. Her behavior is normal. Judgment and thought content normal.   Encounter Medications:   Outpatient Encounter Medications as of 12/22/2017  Medication Sig Note  . acetaminophen (TYLENOL) 325 MG tablet Take 650 mg by mouth every 6 (six) hours as needed (for pain).    Marland Kitchen ALPRAZolam (XANAX) 0.25 MG tablet Take 0.25 mg by mouth 3 (three) times daily as needed for anxiety (anxiety). 12/22/2017: Reports takes 1/2 pill (total 0.125 mg) prn-- uses approximately 2-3 times/ week  . furosemide (LASIX) 20 MG tablet Take 1 tablet (20 mg total) by mouth daily as needed for fluid or edema. 12/22/2017: Has not needed recently  . metoprolol (LOPRESSOR) 25 MG tablet Take 1 tablet (25 mg total) by mouth 2 (two) times daily.   . Multiple Vitamins-Minerals (WOMENS 50+ MULTI VITAMIN/MIN) TABS Take 1 tablet by mouth daily.   . nitroGLYCERIN (NITROSTAT) 0.4 MG SL tablet Place 0.4 mg under the tongue every 5 (five) minutes as needed for chest pain. X 3 doses 12/22/2017: Has not needed recently   . omeprazole (PRILOSEC) 20 MG capsule Take 20 mg by mouth daily as needed (for reflux/heartburn).  12/22/2017: Has not needed recently   . OXYGEN Inhale 2 L into the lungs continuous.    . potassium chloride SA (K-DUR,KLOR-CON) 20 MEQ tablet Take 1 tablet (20 mEq total) by mouth daily as needed (along with lasix.). 12/22/2017: Has not needed recently; has not needed lasix recently  . pravastatin (PRAVACHOL) 40 MG tablet Take 40 mg by mouth every morning.    . venlafaxine XR (EFFEXOR-XR) 150 MG 24 hr capsule Take 150 mg by mouth daily. 11/06/2017: Takes with 1m = 2277m . venlafaxine XR (EFFEXOR-XR) 75  MG 24 hr capsule Take 1 capsule by mouth daily. 11/06/2017: Takes with 15051m50mg  . zolpidem (AMBIEN) 5 MG tablet Take 5 mg by mouth at bedtime.   . Rivaroxaban (XARELTO) 15 MG TABS tablet Take 15 mg by mouth 2 (two) times daily with a meal. samples 12/22/2017: This has been discontinued, post-hospital discharge   No facility-administered encounter medications on file as of 12/22/2017.    Functional Status:   In your present state of health, do you have any difficulty performing the following activities: 12/22/2017 12/14/2017  Hearing? - -  Vision? - -  Difficulty concentrating or making decisions? N -  Comment - -  Walking or climbing stairs? Y YTempie Donningomment - Per daughter/ caregiver [place] [facility] written consent  Dressing or bathing? N N  Comment - Per daughter/ caregiver [place] [facility] written consent  Doing errands, shopping? Y YTempie Donningomment - Per daughter/ caregiver SanLovey [place] [facility] written consent  Preparing Food and eating ? N N  Comment Requires assistance with food prep "occasionally;" family assists; gets mobile meals daily Per daughter/ caregiver [place]  on [facility] CM written consent  Using the Toilet? N N  Comment - Per daughter/ caregiver [place], on [facility] CM written consent  In the past six months, have you accidently leaked urine? N -  Do you have problems with loss of bowel control? N -  Managing your Medications? Y -  Comment Family assists -  Managing your Finances? Y -  Comment Family assists -  Housekeeping or managing your Housekeeping? Tempie Donning  Comment Family assists Per daughter/ caregiver [name], on [facility] CM written consent  Some recent data might be hidden   Fall/Depression Screening:    Fall Risk  12/22/2017 12/14/2017 12/07/2017  Falls in the past year? Yes (No Data) Yes  Comment - "No new or recent falls," per daughter/ caregiver [name], on [facility] CM written consent -  Number falls in past yr: 2 or more - 2 or more  Injury with Fall? Yes - Yes  Risk Factor Category  High Fall Risk  - High Fall Risk  Risk for fall due to : History of fall(s);Impaired mobility;Medication side effect - History of fall(s);Impaired mobility;Other (Comment)  Risk for fall due to: Comment - - History of 2 recent Syncopal episodes  Follow up Falls evaluation completed;Falls prevention discussed;Education provided - Falls prevention discussed   PHQ 2/9 Scores 12/22/2017  PHQ - 2 Score 1   Plan:  Patient will take medications as prescribed and will attend all scheduled provider appointments post-SNF discharge  Patient will continue using assistive devices for ambulation  Patient will actively participate in home health services as ordered post-SNF discharge  Caregiver will continue monitoring and recording patient's daily blood pressures and SaO2 levels, and will begin for daily weights  Patient/ and or caregiver will promptly notify care providers for any new concerns, problems, issues that arise  Patient and caregiver will review printed educational material provided to them today, as indicated  I will share today's [facility] CCM initial home visit notes/ care plan with patient's PCP  [facility] CM outreach for transition of care to continue with scheduled telephone outreach next week   [facility] CM Care Plan Problem One     Most Recent Value  Care Plan Problem One  Risk for hospital readmission, related to/ as evidenced by 2 recent hospital visits for syncope with discharge for rehabilitation to SNF   Role Documenting the Problem One  Care Management Coordinator  Care Plan for Problem One  Active  [facility] Long Term Goal   Over the next 31 days, patient will not experience hospital readmission as evidenced by patient/ caregiver reporting and review of EMR during [facility] RN CCM outreach  [facility] Long Term Goal Start Date  12/07/17 [Goal re-established around patient's recent SNF DC]  Interventions for Problem One Long Term Goal  Initial home visit/ assessment completed,  falls evaluation and medication  review completed with patient and her caregiver  [facility] CM Short Term Goal #1   Over the next 30 days, patient will actively participate in home health services for PT as ordered post-SNF discharge, as evidenced by patient/ caregiver reporting and collaboration with [facility] team as indicated  [facility] CM Short Term Goal #1 Start Date  12/07/17  Interventions for Short Term Goal #1  Discussed with patient and caregiver current [facility] services in place,  participated in home health PT visit with patient during home visit, encouraged patient to continue actively participating in all [facility] services disciplines  [facility] CM Short Term Goal #2   Over the next  30 days, patient and caregiver will meet with [facility] RN CCM for initial home visit/ fall evaluation, as evidenced by successful completion of [facility] RN CCM initial home visit  [facility] CM Short Term Goal #2 Start Date  12/07/17  [facility] CM Short Term Goal #2 Met Date  12/22/17- Goal Met  Interventions for Short Term Goal #2  Patient/ caregiver successfully completed [facility] CM initial home visit    [facility] CM Care Plan Problem Two     Most Recent Value  Care Plan Problem Two  Knowledge deficit regarding self-health management of chronic disease states of A-Fib and CHF, as evidenced by patient and caregiver reporting  Role Documenting the Problem Two  Care Management [place] for Problem Two  Active  [facility] CM Short Term Goal #1   Over the next 30 days, patient/ caregiver will monitor and record patient's daily weights, as evidenced by review of patient's recorded weights during [place] outreach  [facility] CM Short Term Goal #1 Start Date  12/22/17  Interventions for Short Term Goal #2   Using teachback method, provided and discussed printed educational material regarding CHF/ AF zones and corresponding action plans,  discussed rationale/ guidelines for weight gain     Oneta Rack, RN, BSN, [employer] Coordinator [facility] Care Management  [phone]

## 2017-12-23 DIAGNOSIS — M6281 Muscle weakness (generalized): Secondary | ICD-10-CM | POA: Diagnosis not present

## 2017-12-23 DIAGNOSIS — M170 Bilateral primary osteoarthritis of knee: Secondary | ICD-10-CM | POA: Diagnosis not present

## 2017-12-23 DIAGNOSIS — I10 Essential (primary) hypertension: Secondary | ICD-10-CM | POA: Diagnosis not present

## 2017-12-23 DIAGNOSIS — J449 Chronic obstructive pulmonary disease, unspecified: Secondary | ICD-10-CM | POA: Diagnosis not present

## 2017-12-23 DIAGNOSIS — I639 Cerebral infarction, unspecified: Secondary | ICD-10-CM | POA: Diagnosis not present

## 2017-12-23 DIAGNOSIS — R269 Unspecified abnormalities of gait and mobility: Secondary | ICD-10-CM | POA: Diagnosis not present

## 2017-12-23 DIAGNOSIS — Z950 Presence of cardiac pacemaker: Secondary | ICD-10-CM | POA: Diagnosis not present

## 2017-12-24 DIAGNOSIS — J449 Chronic obstructive pulmonary disease, unspecified: Secondary | ICD-10-CM | POA: Diagnosis not present

## 2017-12-24 DIAGNOSIS — I639 Cerebral infarction, unspecified: Secondary | ICD-10-CM | POA: Diagnosis not present

## 2017-12-24 DIAGNOSIS — Z950 Presence of cardiac pacemaker: Secondary | ICD-10-CM | POA: Diagnosis not present

## 2017-12-24 DIAGNOSIS — M6281 Muscle weakness (generalized): Secondary | ICD-10-CM | POA: Diagnosis not present

## 2017-12-24 DIAGNOSIS — R269 Unspecified abnormalities of gait and mobility: Secondary | ICD-10-CM | POA: Diagnosis not present

## 2017-12-24 DIAGNOSIS — I10 Essential (primary) hypertension: Secondary | ICD-10-CM | POA: Diagnosis not present

## 2017-12-24 DIAGNOSIS — M170 Bilateral primary osteoarthritis of knee: Secondary | ICD-10-CM | POA: Diagnosis not present

## 2017-12-28 ENCOUNTER — Other Ambulatory Visit: Payer: Self-pay | Admitting: *Deleted

## 2017-12-28 ENCOUNTER — Encounter: Payer: Self-pay | Admitting: *Deleted

## 2017-12-28 DIAGNOSIS — J449 Chronic obstructive pulmonary disease, unspecified: Secondary | ICD-10-CM | POA: Diagnosis not present

## 2017-12-28 DIAGNOSIS — I639 Cerebral infarction, unspecified: Secondary | ICD-10-CM | POA: Diagnosis not present

## 2017-12-28 DIAGNOSIS — I10 Essential (primary) hypertension: Secondary | ICD-10-CM | POA: Diagnosis not present

## 2017-12-28 DIAGNOSIS — R269 Unspecified abnormalities of gait and mobility: Secondary | ICD-10-CM | POA: Diagnosis not present

## 2017-12-28 DIAGNOSIS — M170 Bilateral primary osteoarthritis of knee: Secondary | ICD-10-CM | POA: Diagnosis not present

## 2017-12-28 DIAGNOSIS — M6281 Muscle weakness (generalized): Secondary | ICD-10-CM | POA: Diagnosis not present

## 2017-12-28 DIAGNOSIS — Z950 Presence of cardiac pacemaker: Secondary | ICD-10-CM | POA: Diagnosis not present

## 2017-12-28 NOTE — Patient Outreach (Signed)
[place] [facility]) Care Management [place] Coordination  12/28/2017  Raven Gray 06/03/1928 [id number]  Successful telephone outreach for care coordination to [facility] with [place] through hospice of the [place] [phone]), re: [name], 84 y.o.femalereferred to [place] for transition of care after two recent hospitalizations for syncoperesulting in mechanical fall with injury.Patient has history including, but not limited to, COPD; A-fib not on ACT due to high falls risk; tachy-brady syndrome with pacemaker in 2018; HTN/ HLD; Aortic insufficiency; previous CVA; GERD; and depression.   Recent hospitalizations include: February 11-13, 2010for syncope/ fall with nasal fracture; patient was discharged home with home health services in place February 15-18, 2019 for syncope with fall; patient was discharged to [facility] rehabilitation; confirmation of SNF discharge was received Monday December 07, 2017  Placed referral to [facility] program, as patient's caregiver/ daughter/ HCPOA agreed to same today; discussed patient's history and recent hospitalizations with Lesleigh Noe, and confirmed that I would also make patient's PCP/ NP Thedora Hinders, NP, [facility] associates/ Dr. Maudie Mercury) aware of referral placed today.  Plan:  Will notify patient's PCP of referral placed today to [facility] Palliative Care team  Will collaborate as indicated with [facility] Connections team  Oneta Rack, RN, BSN, [place] Care Management  [phone]

## 2017-12-28 NOTE — Patient Outreach (Signed)
[place] [facility]) Care Management [place], Transition of Care day 22  12/28/2017  [name] 18-Nov-1927 [id number]  Successful telephone outreach to Raven Gray, daughter/ caregiver/ HCPOA, on [facility] CM written consent, for Raven Gray, 84 y.o. female referred to [place] for transition of care after two recent hospitalizations for syncope resulting in mechanical fall with injury.Patient has history including, but not limited to, COPD; A-fib not on ACT due to high falls risk; tachy-brady syndrome with pacemaker in 2018; HTN/ HLD; Aortic insufficiency; previous CVA; GERD; and depression. HIPAA/ identity verified with patient's caregiver during phone call today.   Recent hospitalizations include: February 11-13, 2071for syncope/ fall with nasal fracture; patient was discharged home with home health services in place February 15-18, 2019 for syncope with fall; patient was discharged to [facility] rehabilitation; confirmation of SNF discharge was received Monday December 07, 2017  Today, patient's caregiver reports that patient "is doing pretty good, without any real changes," and she denies that patient is currently in pain, had any new/ recent falls, or is in any distress.  Caregiver is currently at patient's home with her at time of our phone call today.  Caregiver continues to report that she believes that patient "gets lonely and somewhat scared" during night time hours due to living alone, but again states that patient has continually/ recently verbalized that she does not wish to live with her family, have her family come and stay with her at night, nor to go to SNF/ ALF.    Caregiver reports that since [employer] initial home visit with patient last week, patient has become increasingly worried that she "isn't doing good," and "is very sick," stating that when we reviewed together signs/ symptoms of MI and CVA, that patient is "now worried that these  things are happening to her at every moment."  Caregiver reports that she has assured patient that her values for BP, daily weights, SaO2, etc are in her normal range, and that patient has not been/ is not currently in any distress around her breathing status.  Caregiver feels that patient is "no different," and has been "doing well,' although patient believes that she "is not doing well at all."  Emotional support provided to caregiver; discussed with caregiver that she appropriately notified patient's PCP for direction on patient's non-specific/ intermittent complaints that are not reflected in her daily vital signs at home; encouraged caregiver to make PCP appointment, and she verbalized plans to do so.  Also discussed [facility] Palliative Care program through [place] with caregiver; caregiver is very interested in this program, and requests today that I place this referral on patient's/ caregiver's behalf.  Caregiver further reports: -- home health services for PT/ OT continue; reports HH PT visited with patient "this morning." -- Medications: patient has and takes all medications as prescribed; denies new/ recent changes to medications; denies concerns around medications -- Falls:  patient continues using walker "all the time," at home; denies new/ recent falls --Self-health management of chronic disease state of A-Fib and CHF: ----Caregivercontinuesmonitoring/ recordingpatient's BP and SaO2 levels every day; has begun also monitoring/ recording daily weights; reports "all recent values are normal" and reports daily weight ranges "high 150's to low 160's;" all recently recorded values were discussed with caregiver; confirmed for caregiver that patient's reported values are WNL, with BP ranges 144-158/ 80-84; SaO2 97-99% on O2 at 2 L/min; HR 62-76. ---- patientcontinuesusing home O2 at 2 L/min via [place], "  only as needed," "mostly at night;" caregiver denies that patient has had  recent episodes shortness of breath or difficulty breathing, outside of her baseline, but states, "she would tell you different- she would say that she is very short of breath, more so than usual."  Caregiver states that she believes patient "is the same' as she was at time of [place] initial home visit last week. ---- denies that patient has experienced recent dizziness/ lightheadedness; signs/ symptoms pre-syncope/ A-Fib discussed/ reiterated with patient's caregiver along with action plan for same; caregiver verbalizes accurate understanding of signs/ symptoms that would require urgent/ emergent follow up in UCC/ ED, and action plan for same.  Caregiverdenies further issues, concerns, or problems today. I confirmed thatpatient/ caregiverhasmy direct phone number, the main [facility] CM office phone number, and the [facility] CM 24-hour nurse advice phone number should issues arise prior to next scheduled [place] call nextweek. Encouraged patient/ caregiverto contact me directly if needs, questions, issues, or concerns arise prior to next scheduled outreach; caregiver agreed to do so.  Plan:  Patient will take medications as prescribed and will attend all scheduled provider appointments post-SNF discharge  Patient will continue using assistive devices for ambulation  Patient will actively participate in home health services as ordered post-SNF discharge  Caregiver will continue monitoring and recording patient's daily weights, blood pressures and SaO2 levels  Patient/ and or caregiver will promptly notify care providers for any new concerns, problems, issues that arise  Patient and caregiver will review printed educational material previously provided to them, as indicated  I will make referral to care Connections Palliative care program and will make patient's PCP aware of same  [place] outreach for transition of care to continue with  scheduled telephone outreachnextweek  [facility] CM Care Plan Problem One     Most Recent Value  Care Plan Problem One  Risk for hospital readmission, related to/ as evidenced by 2 recent hospital visits for syncope with discharge for rehabilitation to SNF   Role Documenting the Problem One  Care Management Coordinator  Care Plan for Problem One  Active  [facility] Long Term Goal   Over the next 31 days, patient will not experience hospital readmission as evidenced by patient/ caregiver reporting and review of EMR during [facility] RN CCM outreach  [facility] Long Term Goal Start Date  12/07/17 [Goal re-established around patient's recent SNF DC]  Interventions for Problem One Long Term Goal  Discussed with patient's caregiver current clinical status of patient,  reiterated with caregiver education previously provided during last week's [facility] CCM initial home visit/ action plans for falls, fall prevention,  discussed with caregiver need to schedule PCP office visit, and encouraged caregiver to make PCP follow up appointment for patient   [facility] CM Short Term Goal #1   Over the next 30 days, patient will actively participate in home health services for PT as ordered post-SNF discharge, as evidenced by patient/ caregiver reporting and collaboration with [facility] team as indicated  [facility] CM Short Term Goal #1 Start Date  12/07/17  Interventions for Short Term Goal #1  Discussed with caregiver patient's ongoing participation in home health services,  discussed recent visits made with patient by [facility] PT    [facility] CM Care Plan Problem Two     Most Recent Value  Care Plan Problem Two  Knowledge deficit regarding self-health management of chronic disease states of A-Fib and CHF, as evidenced by patient and caregiver reporting  Role  Documenting the Problem Two  Care Management Coordinator  Care Plan for Problem Two  Active  [facility] CM Short Term Goal #1   Over the next 30 days, patient/ caregiver will monitor and record patient's daily weights, as  evidenced by review of patient's recorded weights during [place] outreach  [facility] CM Short Term Goal #1 Start Date  12/22/17  Interventions for Short Term Goal #2   Confirmed with caregiver that patient has been monitoring and recording daily weight values since [facility] CCM RN home visit last week,  reviewed recently recorded weights with caregiver and reiterated action plan for CHF yellow zone, weight gain guidelines in setting of CHF,  action plan for A-Fib.  Discussed possibility of community paliative care consult to [facility], and confirmed with caregiver that I would place this referral     Oneta Rack, RN, BSN, [place] Coordinator [facility] Care Management  [phone]

## 2017-12-29 DIAGNOSIS — M6281 Muscle weakness (generalized): Secondary | ICD-10-CM | POA: Diagnosis not present

## 2017-12-29 DIAGNOSIS — R269 Unspecified abnormalities of gait and mobility: Secondary | ICD-10-CM | POA: Diagnosis not present

## 2017-12-29 DIAGNOSIS — I10 Essential (primary) hypertension: Secondary | ICD-10-CM | POA: Diagnosis not present

## 2017-12-29 DIAGNOSIS — J449 Chronic obstructive pulmonary disease, unspecified: Secondary | ICD-10-CM | POA: Diagnosis not present

## 2017-12-29 DIAGNOSIS — I639 Cerebral infarction, unspecified: Secondary | ICD-10-CM | POA: Diagnosis not present

## 2017-12-29 DIAGNOSIS — Z950 Presence of cardiac pacemaker: Secondary | ICD-10-CM | POA: Diagnosis not present

## 2017-12-29 DIAGNOSIS — M170 Bilateral primary osteoarthritis of knee: Secondary | ICD-10-CM | POA: Diagnosis not present

## 2017-12-30 DIAGNOSIS — I639 Cerebral infarction, unspecified: Secondary | ICD-10-CM | POA: Diagnosis not present

## 2017-12-30 DIAGNOSIS — M6281 Muscle weakness (generalized): Secondary | ICD-10-CM | POA: Diagnosis not present

## 2017-12-30 DIAGNOSIS — I10 Essential (primary) hypertension: Secondary | ICD-10-CM | POA: Diagnosis not present

## 2017-12-30 DIAGNOSIS — R269 Unspecified abnormalities of gait and mobility: Secondary | ICD-10-CM | POA: Diagnosis not present

## 2017-12-30 DIAGNOSIS — Z950 Presence of cardiac pacemaker: Secondary | ICD-10-CM | POA: Diagnosis not present

## 2017-12-30 DIAGNOSIS — J449 Chronic obstructive pulmonary disease, unspecified: Secondary | ICD-10-CM | POA: Diagnosis not present

## 2017-12-30 DIAGNOSIS — M170 Bilateral primary osteoarthritis of knee: Secondary | ICD-10-CM | POA: Diagnosis not present

## 2017-12-31 DIAGNOSIS — M6281 Muscle weakness (generalized): Secondary | ICD-10-CM | POA: Diagnosis not present

## 2017-12-31 DIAGNOSIS — Z950 Presence of cardiac pacemaker: Secondary | ICD-10-CM | POA: Diagnosis not present

## 2017-12-31 DIAGNOSIS — M170 Bilateral primary osteoarthritis of knee: Secondary | ICD-10-CM | POA: Diagnosis not present

## 2017-12-31 DIAGNOSIS — R269 Unspecified abnormalities of gait and mobility: Secondary | ICD-10-CM | POA: Diagnosis not present

## 2017-12-31 DIAGNOSIS — J449 Chronic obstructive pulmonary disease, unspecified: Secondary | ICD-10-CM | POA: Diagnosis not present

## 2017-12-31 DIAGNOSIS — I639 Cerebral infarction, unspecified: Secondary | ICD-10-CM | POA: Diagnosis not present

## 2017-12-31 DIAGNOSIS — I10 Essential (primary) hypertension: Secondary | ICD-10-CM | POA: Diagnosis not present

## 2018-01-04 DIAGNOSIS — I10 Essential (primary) hypertension: Secondary | ICD-10-CM | POA: Diagnosis not present

## 2018-01-04 DIAGNOSIS — Z950 Presence of cardiac pacemaker: Secondary | ICD-10-CM | POA: Diagnosis not present

## 2018-01-04 DIAGNOSIS — I639 Cerebral infarction, unspecified: Secondary | ICD-10-CM | POA: Diagnosis not present

## 2018-01-04 DIAGNOSIS — M170 Bilateral primary osteoarthritis of knee: Secondary | ICD-10-CM | POA: Diagnosis not present

## 2018-01-04 DIAGNOSIS — M6281 Muscle weakness (generalized): Secondary | ICD-10-CM | POA: Diagnosis not present

## 2018-01-04 DIAGNOSIS — R269 Unspecified abnormalities of gait and mobility: Secondary | ICD-10-CM | POA: Diagnosis not present

## 2018-01-04 DIAGNOSIS — J449 Chronic obstructive pulmonary disease, unspecified: Secondary | ICD-10-CM | POA: Diagnosis not present

## 2018-01-05 DIAGNOSIS — I10 Essential (primary) hypertension: Secondary | ICD-10-CM | POA: Diagnosis not present

## 2018-01-05 DIAGNOSIS — G4710 Hypersomnia, unspecified: Secondary | ICD-10-CM | POA: Diagnosis not present

## 2018-01-06 ENCOUNTER — Encounter: Payer: Self-pay | Admitting: *Deleted

## 2018-01-06 ENCOUNTER — Other Ambulatory Visit: Payer: Self-pay | Admitting: *Deleted

## 2018-01-06 DIAGNOSIS — I10 Essential (primary) hypertension: Secondary | ICD-10-CM | POA: Diagnosis not present

## 2018-01-06 DIAGNOSIS — J449 Chronic obstructive pulmonary disease, unspecified: Secondary | ICD-10-CM | POA: Diagnosis not present

## 2018-01-06 DIAGNOSIS — M6281 Muscle weakness (generalized): Secondary | ICD-10-CM | POA: Diagnosis not present

## 2018-01-06 DIAGNOSIS — Z950 Presence of cardiac pacemaker: Secondary | ICD-10-CM | POA: Diagnosis not present

## 2018-01-06 DIAGNOSIS — M170 Bilateral primary osteoarthritis of knee: Secondary | ICD-10-CM | POA: Diagnosis not present

## 2018-01-06 DIAGNOSIS — I639 Cerebral infarction, unspecified: Secondary | ICD-10-CM | POA: Diagnosis not present

## 2018-01-06 DIAGNOSIS — R269 Unspecified abnormalities of gait and mobility: Secondary | ICD-10-CM | POA: Diagnosis not present

## 2018-01-06 NOTE — Patient Outreach (Signed)
[place]) Care Management [place] Telephone Outreach, Transition of Care day 31  01/06/2018  CHRISTELLA APP 1928-08-25 [id number]  Successful telephone outreach to [facility], daughter/ caregiver/ HCPOA, on [facility] CM written consent, for ROSLIN NORWOOD, 84 y.o.femalereferred to [place] for transition of care after two recent hospitalizations for syncoperesulting in mechanical fall with injury.Patient has history including, but not limited to, COPD; A-fib not on ACT due to high falls risk; tachy-brady syndrome with pacemaker in 2018; HTN/ HLD; Aortic insufficiency; previous CVA; GERD; and depression. HIPAA/ identity verified with patient's caregiver during phone call today.   Recent hospitalizations include: February 11-13, 2026fr syncope/ fall with nasal fracture; patient was discharged home with home health services in place February 15-18, 2019 for syncope with fall; [facility] confirmation of SN[facility] discharge was received Monday December 07, 2017  Today,patient's caregiver reports that patient "is doing okay," and she denies that patient is currently in pain,or is in any distress.  Caregiver reports that patient experienced recent fall, "earlier this week," without injury; states that patient apparently slid out of her bed in bedroom and used her life-alert to notify daughter.  Daughter reports she was able to immediately go to patient's home and assist her in getting up without having to call for additional assistance.  Reports patient "doing fine" ever since fall, which she believes occurred on Monday 01/04/18.  Reports that she took patient to visit PCP 01/05/18, "maily to re-assure patient" that "everything is okay."  Reports that she received "good report" yesterday from patient's PCP, and that no changes were made a result of yesterday's office visit, except that patient's sleeping medication was discontinued.  Caregiver further  reports:  -- home health servicesfor PT continues; reports [facility]PT [facility] with patient "this morning," and that [facility] "[facility] now [facility] -- Medications: patient has and takes all medications as prescribed; states that PCP "[place] but denies new/ recent changes to other medications; denies concerns around medications -- Falls:patient continues using walker "all the time," at home; denies new/ recent falls other than what was reported above-- fall prevention reiterated with caregiver today --Self-health management of chronic disease state of A-Fib and CHF: ----continuesmonitoring/ recordingpatient's BP, daily weights, and SaO2 levelsevery day; reports "all recent values are in her normal range" and reports daily weight ranges "from 157-163 lbs;" states discussed with provider during office visit yesterday; continues using lasix prn according to weight gain guidelines.  BP ranges reported as 144-158/82-84; SaO2 consistently at "98-99%" with use of home O2 at 2 L/min. HR values reported at 62-76.  Caregiver able to accurately verbalize action plan for CHF zones/ weight gain guidelines and reports that she believes patient has been "in green zone" since [facility] ---- patientcontinuesusing home O2 at 2 L/min via [place], "as needed,""still mostly at night;" caregiver denies that patient has had recent episodes shortness of breath or difficulty breathing, outside of her baseline. ---- denies that patient has experienced/ reported to her recent dizziness/ lightheadedness; signs/ symptoms pre-syncope/ A-Fib discussed/ reiterated with patient's caregiver along with action plan for same; caregiver again verbalizes accurate understanding of signs/ symptoms that would require urgent/ emergent follow up in UCC/ ED, and action plan for same.  Discussed with caregiver previously placed referral to [place]; caregiver continues verbalizing interest in this program, but reports that she has not yet heard from Care  Connections team; discussed with caregiver that I would reach out to [facility] for update status on previously placed referral.  Caregiverdeniesfurther issues, concerns, or problems today. I confirmed thatpatient/caregiverhasmy direct phone number, the main [facility] CM office phone number, and the [facility] CM 24-hour nurse advice phone number should issues arise prior to next scheduled [place]. Encouraged patient/caregiverto contact me directly if needs, questions, issues, or concerns arise prior to next scheduled outreach;caregiveragreed to do so.  Plan:  Patient will take medications as prescribed and will attend all scheduled provider appointments post-SNF discharge  Patient will continue using assistive devices for ambulation  Patient will actively participate in home health services as ordered post-SNF discharge  Caregiver will continue monitoring and recording patient's daily weights, blood pressures and SaO2 levels  Patient/ and or caregiver will promptly notify care providers for any new concerns, problems, issues that arise  Patient and caregiver will continue reviewing printed educational material previously provided to them, as indicated  I will make care coordination outreach to [facility] Palliative care program for update on referral status placed last week  [facility] Community CM outreach to continue with scheduledtelephone outreachnextweek  [facility] CM Care Plan Problem One     Most Recent Value  Care Plan Problem One  Risk for hospital readmission, related to/ as evidenced by 2 recent hospital visits for syncope with discharge for rehabilitation to SNF   Role Documenting the Problem One  Care Management Coordinator  Care Plan for Problem One  Not Active  [facility]N Long Term Goal   Over the next 31  days, patient will not experience hospital readmission as evidenced by patient/ caregiver reporting and review of EMR during [facility] RN CCM outreach  [facility] Long Term Goal Start Date  12/07/17 [Goal re-established around patient's recent SNF DC]  [facility] Long Term Goal Met Date  01/06/18 - Goal met  Interventions for Problem One Long Term Goal  Confirmed with caregiver that patient did not experience hospital re-admission within 31 days of SNF discharge,  reviewed with caregiver recent provider appointment with PCP  [facility] CM Short Term Goal #1   Over the next 30 days, patient will actively participate in home health services for PT as ordered post-SNF discharge, as evidenced by patient/ caregiver reporting and collaboration with [facility] team as indicated  [facility] CM Short Term Goal #1 Start Date  12/07/17  [facility] CM Short Term Goal #1 Met Date  01/06/18 -Goal met  Interventions for Short Term Goal #1  Confirmed with patient's caregiver/ daughter that she continues to actively participate in [facility] services    [facility] CM Care Plan Problem Two     Most Recent Value  Care Plan Problem Two  Knowledge deficit regarding self-health management of chronic disease states of A-Fib and CHF, as evidenced by patient and caregiver reporting  Role Documenting the Problem Two  Care Management Coordinator  Care Plan for Problem Two  Active  [facility] CM Short Term Goal #1   Over the next 30 days, patient/ caregiver will monitor and record patient's daily weights, as evidenced by review of patient's recorded weights during [place] outreach  [facility] CM Short Term Goal #1 Start Date  12/22/17  Interventions for Short Term Goal #2   Discussed with patient's caregiver patient's current clinical condition,  reviewed recently recorded weights at home,  use of patient's home O2,  reiterated previously provided education, and placed another care coordination outreach to care Connections Palliative care team for  follow up on previously placed referral      Oneta Rack, RN, BSN, [place] Care Management  [phone]

## 2018-01-06 NOTE — Patient Outreach (Signed)
[place] [facility]) Care Management [place] Telephone [facility] Coordination  01/06/2018  AMANEE IACOVELLI 04/10/1928 [id number]  Unsuccessful telephone outreach for Care coordination to [facility] with [facility] Palliative Care Program through hospice of the [place] [phone]), re: Raven Gray y.o.femalereferred to [place] for transition of care after two recent hospitalizations for syncoperesulting in mechanical fall with injury.Patient has history including, but not limited to, COPD; A-fib not on ACT due to high falls risk; tachy-brady syndrome with pacemaker in 2018; HTN/ HLD; Aortic insufficiency; previous CVA; GERD; and depression.   Recent hospitalizations include: February 11-13, 2025for syncope/ fall with nasal fracture; patient was discharged home with home health services in place February 15-18, 2019 for syncope with fall; patient was discharged to [facility] rehabilitation; confirmation of SNF discharge was received Monday December 07, 2017  Call was placed today to obtain update on previously placed referral from 12/28/17 to [facility] Connections program; left Manus Gunning detailed secure voice mail, requesting call back.  1:35 pm:  Received call back/ voicemail from Manus Gunning sharing that [facility] Connections team had just received order for referral from patient's PCP; Manus Gunning shared that [facility] team would be reaching out to patient's caregiver soon to schedule a home visit  Plan:  Will collaborate as indicated with Care Connections team  Oneta Rack, RN, BSN, [place] Care Management  [phone]

## 2018-01-08 DIAGNOSIS — M170 Bilateral primary osteoarthritis of knee: Secondary | ICD-10-CM | POA: Diagnosis not present

## 2018-01-08 DIAGNOSIS — M6281 Muscle weakness (generalized): Secondary | ICD-10-CM | POA: Diagnosis not present

## 2018-01-08 DIAGNOSIS — I639 Cerebral infarction, unspecified: Secondary | ICD-10-CM | POA: Diagnosis not present

## 2018-01-08 DIAGNOSIS — I10 Essential (primary) hypertension: Secondary | ICD-10-CM | POA: Diagnosis not present

## 2018-01-08 DIAGNOSIS — R269 Unspecified abnormalities of gait and mobility: Secondary | ICD-10-CM | POA: Diagnosis not present

## 2018-01-08 DIAGNOSIS — Z950 Presence of cardiac pacemaker: Secondary | ICD-10-CM | POA: Diagnosis not present

## 2018-01-08 DIAGNOSIS — J449 Chronic obstructive pulmonary disease, unspecified: Secondary | ICD-10-CM | POA: Diagnosis not present

## 2018-01-11 DIAGNOSIS — M6281 Muscle weakness (generalized): Secondary | ICD-10-CM | POA: Diagnosis not present

## 2018-01-11 DIAGNOSIS — I639 Cerebral infarction, unspecified: Secondary | ICD-10-CM | POA: Diagnosis not present

## 2018-01-11 DIAGNOSIS — M170 Bilateral primary osteoarthritis of knee: Secondary | ICD-10-CM | POA: Diagnosis not present

## 2018-01-11 DIAGNOSIS — R269 Unspecified abnormalities of gait and mobility: Secondary | ICD-10-CM | POA: Diagnosis not present

## 2018-01-11 DIAGNOSIS — Z950 Presence of cardiac pacemaker: Secondary | ICD-10-CM | POA: Diagnosis not present

## 2018-01-11 DIAGNOSIS — J449 Chronic obstructive pulmonary disease, unspecified: Secondary | ICD-10-CM | POA: Diagnosis not present

## 2018-01-11 DIAGNOSIS — I10 Essential (primary) hypertension: Secondary | ICD-10-CM | POA: Diagnosis not present

## 2018-01-14 ENCOUNTER — Other Ambulatory Visit: Payer: Self-pay | Admitting: *Deleted

## 2018-01-14 ENCOUNTER — Encounter: Payer: Self-pay | Admitting: *Deleted

## 2018-01-14 DIAGNOSIS — R269 Unspecified abnormalities of gait and mobility: Secondary | ICD-10-CM | POA: Diagnosis not present

## 2018-01-14 DIAGNOSIS — M6281 Muscle weakness (generalized): Secondary | ICD-10-CM | POA: Diagnosis not present

## 2018-01-14 DIAGNOSIS — J449 Chronic obstructive pulmonary disease, unspecified: Secondary | ICD-10-CM | POA: Diagnosis not present

## 2018-01-14 DIAGNOSIS — Z950 Presence of cardiac pacemaker: Secondary | ICD-10-CM | POA: Diagnosis not present

## 2018-01-14 DIAGNOSIS — M170 Bilateral primary osteoarthritis of knee: Secondary | ICD-10-CM | POA: Diagnosis not present

## 2018-01-14 DIAGNOSIS — I10 Essential (primary) hypertension: Secondary | ICD-10-CM | POA: Diagnosis not present

## 2018-01-14 DIAGNOSIS — I639 Cerebral infarction, unspecified: Secondary | ICD-10-CM | POA: Diagnosis not present

## 2018-01-14 NOTE — Patient Outreach (Signed)
[facility] [facility]) Care Management [place] Telephone Outreach/ Case Closure  01/14/2018  Raven Gray 02-22-1928 [id number]  Unsuccessful telephone outreach to [facility], daughter/ caregiver/ HCPOA, on [facility] CM written consent, forMary B Gray,84 y.o.femalereferred to [facility] for transition of care after two recent hospitalizations for syncoperesulting in mechanical fall with injury.Patient has history including, but not limited to, COPD; A-fib not on ACT due to high falls risk; tachy-brady syndrome with pacemaker in 2018; HTN/ HLD; Aortic insufficiency; previous CVA; GERD; and depression.   Recent hospitalizations include: February 11-13, 207fr syncope/ fall with nasal fracture; patient was discharged home with home health services in place February 15-18, 2019 for syncope with fall; [facility] confirmation of [facility] discharge was received Monday December 07, 2017  HIPAA compliant voice mail message left for patient's caregiver, requesting return call back.  11:25 am:  Patient's caregiver returned my call from earlier today; HIPAA/ identity verified with patient's caregiver.  Today,patient's caregiverreports thatpatient "is not doing very well; having lots of anxiety and maybe even some panic attacks."  Caregiver denies that patient has had changes in vital signs, which she has continued monitoring daily, and she denies new/ recent patient falls today.  States, "she just seems very anxious, and thinks she is not breathing good, but she seems to be breathing fine."  Caregiver further reports:  -- home health servicesfor PTcontinues; reports HH PT has scheduled visit with patient "this morning," and that these services will continue 3 x per week "for now."  -- Medications: patient has and takes all medications as prescribed;again reports that PCP "recently stopped [place] but states that patient is not tolerating this, and  that she believes patient's ongoing anxiety is partially due to not sleeping well at night; caregiver was encouraged to contact PCP for additional evaluation of patient's anxiety, which caregiver reports she is planning to do.  Denies any additional changes to patient's medications.  -- Falls:patient continues using walker "all the time," at home; denies new/ recent falls since last [place]; fall prevention was again reiterated with caregiver today  --Self-health management of chronic disease state of A-Fib and CHF: ----continuesmonitoring/ recordingpatient's BP, daily weights, and SaO2 levelsevery day;reports "all recent values are in her normal range" and reports daily weight ranges "up a little bit from normal range;" does not have actual values for review at time of phone call; states that she has continued administering lasix for periods of weight gain, according to prescribed guidelines from PCP.  Reports she administered one dose of Lasix this week. Reports daily SaO2 levels consistently " or above 92%" and confirms that patient has continued using home O2 at 2 L/min.  Caregiver continues able to accurately verbalize action plan for CHF zones/ weight gain guidelines and reports that she believes patient has been "in green zone" but that "patient would disagree, because of the anxiety she is having." ---- deniesthat patient has experienced/ reported to her recentdizziness/ lightheadedness; signs/ symptoms pre-syncope/ A-Fib discussed/ reiterated with patient's caregiver along with action plan for same; caregiver again verbalizes accurate understanding of signs/ symptoms that would require urgent/ emergent follow up in UCC/ ED, and action plan for same.  Caregiver confirms that she has heard from [facility] through [facility] reports [facility] team visited with she and patient yesterday, and that patient has been enrolled in their  program; caregiver very appreciative of this referral and expresses great confidence in  the [facility] program.  Discussed [facility] CCM case closure now that [facility] team is active with patient, and caregiver agrees with [facility] CCM case closure today.  Caregiverdeniesfurther issues, concerns, or problems today.   Plan:  Will close [place] patient case, as [facility] program is now active, and will make patient's PCP aware of same  [facility] CM Care Plan Problem Two     Most Recent Value  Care Plan Problem Two  Knowledge deficit regarding self-health management of chronic disease states of A-Fib and CHF, as evidenced by patient and caregiver reporting  Role Documenting the Problem Two  Care Management Coordinator  Care Plan for Problem Two  Not Active  [facility] CM Short Term Goal #1   Over the next 30 days, patient/ caregiver will monitor and record patient's daily weights, as evidenced by review of patient's recorded weights during [place] outreach  [facility] CM Short Term Goal #1 Start Date  12/22/17  [facility] CM Short Term Goal #1 Met Date   01/14/18 - Goal met  Interventions for Short Term Goal #2   Confirmed that patient's caregiver has continued monitoring/ recording daily weights and all VS's,  confirmed that patient is now enrolled/ active with [facility] and agrees with [facility] CCM case closure     It has been a pleasure caring for [name],  Laine Jamse Arn, RN, BSN, [place] Care Management  [phone]

## 2018-01-15 ENCOUNTER — Ambulatory Visit: Payer: Self-pay | Admitting: *Deleted

## 2018-01-15 DIAGNOSIS — I10 Essential (primary) hypertension: Secondary | ICD-10-CM | POA: Diagnosis not present

## 2018-01-15 DIAGNOSIS — J449 Chronic obstructive pulmonary disease, unspecified: Secondary | ICD-10-CM | POA: Diagnosis not present

## 2018-01-15 DIAGNOSIS — M170 Bilateral primary osteoarthritis of knee: Secondary | ICD-10-CM | POA: Diagnosis not present

## 2018-01-15 DIAGNOSIS — R269 Unspecified abnormalities of gait and mobility: Secondary | ICD-10-CM | POA: Diagnosis not present

## 2018-01-15 DIAGNOSIS — Z950 Presence of cardiac pacemaker: Secondary | ICD-10-CM | POA: Diagnosis not present

## 2018-01-15 DIAGNOSIS — M6281 Muscle weakness (generalized): Secondary | ICD-10-CM | POA: Diagnosis not present

## 2018-01-15 DIAGNOSIS — I639 Cerebral infarction, unspecified: Secondary | ICD-10-CM | POA: Diagnosis not present

## 2018-01-18 ENCOUNTER — Ambulatory Visit (INDEPENDENT_AMBULATORY_CARE_PROVIDER_SITE_OTHER): Payer: Medicare Other | Admitting: *Deleted

## 2018-01-18 DIAGNOSIS — M6281 Muscle weakness (generalized): Secondary | ICD-10-CM | POA: Diagnosis not present

## 2018-01-18 DIAGNOSIS — J449 Chronic obstructive pulmonary disease, unspecified: Secondary | ICD-10-CM | POA: Diagnosis not present

## 2018-01-18 DIAGNOSIS — M170 Bilateral primary osteoarthritis of knee: Secondary | ICD-10-CM | POA: Diagnosis not present

## 2018-01-18 DIAGNOSIS — Z950 Presence of cardiac pacemaker: Secondary | ICD-10-CM | POA: Diagnosis not present

## 2018-01-18 DIAGNOSIS — R269 Unspecified abnormalities of gait and mobility: Secondary | ICD-10-CM | POA: Diagnosis not present

## 2018-01-18 DIAGNOSIS — I10 Essential (primary) hypertension: Secondary | ICD-10-CM | POA: Diagnosis not present

## 2018-01-18 DIAGNOSIS — I495 Sick sinus syndrome: Secondary | ICD-10-CM | POA: Diagnosis not present

## 2018-01-18 DIAGNOSIS — I639 Cerebral infarction, unspecified: Secondary | ICD-10-CM | POA: Diagnosis not present

## 2018-01-18 NOTE — Progress Notes (Signed)
Remote pacemaker transmission.   

## 2018-01-19 ENCOUNTER — Encounter: Payer: Self-pay | Admitting: Cardiology

## 2018-01-19 ENCOUNTER — Telehealth: Payer: Self-pay | Admitting: Cardiovascular Disease

## 2018-01-19 NOTE — Telephone Encounter (Signed)
Patient daughter called and stated that pt is not feeling well. Pt is complaining of shortness of breath, chest pain. Pt daughter believes it may be anxiety. Call routed to [place].

## 2018-01-19 NOTE — Telephone Encounter (Signed)
Spoke with pt's daughter informed her that I had reviewed the transmission from yesterday and that her pacemaker was functioning normally and that she had not had any fast heart rates in the bottom chamber of her heart, informed pts daughter that she could be feeling AF but because pt doesn't have an atrial lead I could not verify that is what she is feeling, pts daughter voiced understanding of this, she also stated that pt is not sleeping at night and sleeping during the day and that she is going to call pts primary care doctor and see if she can get pts sleeping medication back she feels like pt isn't sleeping and that its exacerbating pts anxiety. Encouraged pts daughter to call back if she had anymore concerns, pts daughter voiced appreciation of call back.

## 2018-01-19 NOTE — Telephone Encounter (Signed)
New message  Pt daughter verbalzied that she is calling for RN  1. Has your device fired? Unknown   2. Is you device beeping? no  3. Are you experiencing draining or swelling at device site? no 4. Are you calling to see if we received your device transmission? yes  5. Have you passed out? No   Pt daughter thinks she is having a panic attack:  Sob and her chest hurts  She wants to see if anything is showing up    Please route to [place]

## 2018-01-20 DIAGNOSIS — I10 Essential (primary) hypertension: Secondary | ICD-10-CM | POA: Diagnosis not present

## 2018-01-20 DIAGNOSIS — M170 Bilateral primary osteoarthritis of knee: Secondary | ICD-10-CM | POA: Diagnosis not present

## 2018-01-20 DIAGNOSIS — M6281 Muscle weakness (generalized): Secondary | ICD-10-CM | POA: Diagnosis not present

## 2018-01-20 DIAGNOSIS — I639 Cerebral infarction, unspecified: Secondary | ICD-10-CM | POA: Diagnosis not present

## 2018-01-20 DIAGNOSIS — J449 Chronic obstructive pulmonary disease, unspecified: Secondary | ICD-10-CM | POA: Diagnosis not present

## 2018-01-20 DIAGNOSIS — Z950 Presence of cardiac pacemaker: Secondary | ICD-10-CM | POA: Diagnosis not present

## 2018-01-20 DIAGNOSIS — R269 Unspecified abnormalities of gait and mobility: Secondary | ICD-10-CM | POA: Diagnosis not present

## 2018-01-20 LAB — CUP PACEART REMOTE DEVICE CHECK
Battery Voltage: 3.12 V
Brady Statistic RV Percent Paced: 71.37 %
Implantable Lead Model: 5076
Lead Channel Pacing Threshold Pulse Width: 0.4 ms
Lead Channel Sensing Intrinsic Amplitude: 16.125 mV
Lead Channel Setting Pacing Amplitude: 2 V
Lead Channel Setting Pacing Pulse Width: 0.4 ms
MDC IDC LEAD IMPLANT DT: 20180711
MDC IDC LEAD LOCATION: 753860
MDC IDC MSMT BATTERY REMAINING LONGEVITY: 167 mo
MDC IDC MSMT LEADCHNL RV IMPEDANCE VALUE: 342 Ohm
MDC IDC MSMT LEADCHNL RV IMPEDANCE VALUE: 418 Ohm
MDC IDC MSMT LEADCHNL RV PACING THRESHOLD AMPLITUDE: 0.625 V
MDC IDC MSMT LEADCHNL RV SENSING INTR AMPL: 16.125 mV
MDC IDC PG IMPLANT DT: 20180711
MDC IDC SESS DTM: 20190429152741
MDC IDC SET LEADCHNL RV SENSING SENSITIVITY: 0.9 mV

## 2018-01-21 DIAGNOSIS — I10 Essential (primary) hypertension: Secondary | ICD-10-CM | POA: Diagnosis not present

## 2018-01-21 DIAGNOSIS — M6281 Muscle weakness (generalized): Secondary | ICD-10-CM | POA: Diagnosis not present

## 2018-01-21 DIAGNOSIS — J449 Chronic obstructive pulmonary disease, unspecified: Secondary | ICD-10-CM | POA: Diagnosis not present

## 2018-01-21 DIAGNOSIS — Z950 Presence of cardiac pacemaker: Secondary | ICD-10-CM | POA: Diagnosis not present

## 2018-01-21 DIAGNOSIS — I639 Cerebral infarction, unspecified: Secondary | ICD-10-CM | POA: Diagnosis not present

## 2018-01-21 DIAGNOSIS — R269 Unspecified abnormalities of gait and mobility: Secondary | ICD-10-CM | POA: Diagnosis not present

## 2018-01-21 DIAGNOSIS — M170 Bilateral primary osteoarthritis of knee: Secondary | ICD-10-CM | POA: Diagnosis not present

## 2018-01-25 DIAGNOSIS — R296 Repeated falls: Secondary | ICD-10-CM | POA: Diagnosis not present

## 2018-01-25 DIAGNOSIS — Z515 Encounter for palliative care: Secondary | ICD-10-CM | POA: Diagnosis not present

## 2018-01-25 DIAGNOSIS — G4700 Insomnia, unspecified: Secondary | ICD-10-CM | POA: Diagnosis not present

## 2018-01-25 DIAGNOSIS — I5032 Chronic diastolic (congestive) heart failure: Secondary | ICD-10-CM | POA: Diagnosis not present

## 2018-01-25 DIAGNOSIS — I110 Hypertensive heart disease with heart failure: Secondary | ICD-10-CM | POA: Diagnosis not present

## 2018-01-27 DIAGNOSIS — J449 Chronic obstructive pulmonary disease, unspecified: Secondary | ICD-10-CM | POA: Diagnosis not present

## 2018-01-28 DIAGNOSIS — E789 Disorder of lipoprotein metabolism, unspecified: Secondary | ICD-10-CM | POA: Diagnosis not present

## 2018-01-28 DIAGNOSIS — E118 Type 2 diabetes mellitus with unspecified complications: Secondary | ICD-10-CM | POA: Diagnosis not present

## 2018-01-28 DIAGNOSIS — Z950 Presence of cardiac pacemaker: Secondary | ICD-10-CM | POA: Diagnosis not present

## 2018-01-28 DIAGNOSIS — I10 Essential (primary) hypertension: Secondary | ICD-10-CM | POA: Diagnosis not present

## 2018-01-28 DIAGNOSIS — I639 Cerebral infarction, unspecified: Secondary | ICD-10-CM | POA: Diagnosis not present

## 2018-01-28 DIAGNOSIS — R269 Unspecified abnormalities of gait and mobility: Secondary | ICD-10-CM | POA: Diagnosis not present

## 2018-01-28 DIAGNOSIS — M170 Bilateral primary osteoarthritis of knee: Secondary | ICD-10-CM | POA: Diagnosis not present

## 2018-01-28 DIAGNOSIS — J449 Chronic obstructive pulmonary disease, unspecified: Secondary | ICD-10-CM | POA: Diagnosis not present

## 2018-01-28 DIAGNOSIS — M6281 Muscle weakness (generalized): Secondary | ICD-10-CM | POA: Diagnosis not present

## 2018-01-29 DIAGNOSIS — Z950 Presence of cardiac pacemaker: Secondary | ICD-10-CM | POA: Diagnosis not present

## 2018-01-29 DIAGNOSIS — J449 Chronic obstructive pulmonary disease, unspecified: Secondary | ICD-10-CM | POA: Diagnosis not present

## 2018-01-29 DIAGNOSIS — I639 Cerebral infarction, unspecified: Secondary | ICD-10-CM | POA: Diagnosis not present

## 2018-01-29 DIAGNOSIS — M170 Bilateral primary osteoarthritis of knee: Secondary | ICD-10-CM | POA: Diagnosis not present

## 2018-01-29 DIAGNOSIS — M6281 Muscle weakness (generalized): Secondary | ICD-10-CM | POA: Diagnosis not present

## 2018-01-29 DIAGNOSIS — I10 Essential (primary) hypertension: Secondary | ICD-10-CM | POA: Diagnosis not present

## 2018-01-29 DIAGNOSIS — R269 Unspecified abnormalities of gait and mobility: Secondary | ICD-10-CM | POA: Diagnosis not present

## 2018-02-01 DIAGNOSIS — I10 Essential (primary) hypertension: Secondary | ICD-10-CM | POA: Diagnosis not present

## 2018-02-01 DIAGNOSIS — M6281 Muscle weakness (generalized): Secondary | ICD-10-CM | POA: Diagnosis not present

## 2018-02-01 DIAGNOSIS — J449 Chronic obstructive pulmonary disease, unspecified: Secondary | ICD-10-CM | POA: Diagnosis not present

## 2018-02-01 DIAGNOSIS — R269 Unspecified abnormalities of gait and mobility: Secondary | ICD-10-CM | POA: Diagnosis not present

## 2018-02-01 DIAGNOSIS — I639 Cerebral infarction, unspecified: Secondary | ICD-10-CM | POA: Diagnosis not present

## 2018-02-01 DIAGNOSIS — Z950 Presence of cardiac pacemaker: Secondary | ICD-10-CM | POA: Diagnosis not present

## 2018-02-01 DIAGNOSIS — M170 Bilateral primary osteoarthritis of knee: Secondary | ICD-10-CM | POA: Diagnosis not present

## 2018-02-02 DIAGNOSIS — Z0000 Encounter for general adult medical examination without abnormal findings: Secondary | ICD-10-CM | POA: Diagnosis not present

## 2018-02-02 DIAGNOSIS — E7800 Pure hypercholesterolemia, unspecified: Secondary | ICD-10-CM | POA: Diagnosis not present

## 2018-02-02 DIAGNOSIS — I10 Essential (primary) hypertension: Secondary | ICD-10-CM | POA: Diagnosis not present

## 2018-02-02 DIAGNOSIS — E118 Type 2 diabetes mellitus with unspecified complications: Secondary | ICD-10-CM | POA: Diagnosis not present

## 2018-02-17 ENCOUNTER — Telehealth: Payer: Self-pay | Admitting: Cardiovascular Disease

## 2018-02-17 NOTE — Telephone Encounter (Signed)
[facility] out seeing [facility]. Patient has been declining over the last week. She has increased shortness of breath and increased weakness. O2 sat is 92 % on 2 liters. Olivia Mackie asking for sooner appointment than next week. Scheduled appointment for Friday 02/19/18. Roena Malady of date, time, and location.

## 2018-02-19 ENCOUNTER — Encounter: Payer: Self-pay | Admitting: Physician Assistant

## 2018-02-19 ENCOUNTER — Ambulatory Visit: Payer: Medicare Other | Admitting: Physician Assistant

## 2018-02-19 VITALS — BP 138/72 | HR 45 | Ht 61.0 in | Wt 168.0 lb

## 2018-02-19 DIAGNOSIS — I4891 Unspecified atrial fibrillation: Secondary | ICD-10-CM

## 2018-02-19 DIAGNOSIS — I5033 Acute on chronic diastolic (congestive) heart failure: Secondary | ICD-10-CM | POA: Diagnosis not present

## 2018-02-19 DIAGNOSIS — Z79899 Other long term (current) drug therapy: Secondary | ICD-10-CM | POA: Diagnosis not present

## 2018-02-19 MED ORDER — FUROSEMIDE 40 MG PO TABS: 40.0000 mg | ORAL_TABLET | Freq: Every day | ORAL | 3 refills | Status: DC

## 2018-02-19 NOTE — Patient Instructions (Signed)
Medication Instructions: Take lasix 40 mg two times a day for 3 days. Then take 40 mg once a day thereafter.  Tale 2 tablets of Potassium for every 40 mg of lasix.  If you need a refill on your cardiac medications before your next appointment, please call your pharmacy.   Labwork: Your physician recommends that you return for lab work in: Today (BMP)     Follow-Up: Your physician wants you to follow-up in 2 weeks with Rosaria Ferries, PA Special Instructions:    Thank you for choosing Heart[facility] at [facility]!!

## 2018-02-19 NOTE — Progress Notes (Signed)
Cardiology Office Note   Date:  02/19/2018   ID:  Raven Gray, DOB 07/17/1928, MRN [id number]  PCP:  [name], MD  Cardiologist: Dr. Gwenlyn Found, 11/27/2017 Dr Croitoru for PPM Rosaria Ferries, PA-C   Chief Complaint  Patient presents with  . Follow-up    denies chest pains, does complain of neck and back pain, SOB noted on o2, swelling noted in feet    History of Present Illness: Raven Gray is a 84 y.o. female with a history of remote DVT/PE previously on Xarelto (stopped 10/2017 due to age and fall risk), HTN, HLD, COPD, FH CAD (father and daughter), Afib dx 2015 and now permanent, MV 2017 low risk, pauses on event monitor>>MDT PPM.   03/08 office visit, BP well controlled, last lipids 12/2016 and controlled, PPM interrogated, no problems 04/30 phone notes regarding SOB and CP 05/29 phone note, pt w/ increasing SOB, O2 sats 92% on 2 lpm  Raven Gray presents for cardiology follow up.  She has upper chest pain. It goes from her L shoulder around to her R shoulder and she also has neck pain. She has known OA and back problems.   Her oxygen level is usually 95% or better, today and yesterday, it was 92%. She feels more SOB than usual, her O2 usage has increased from prn to 2 lpm 24/7. Her weight has not changed.   She was up all night long for the last 3 nights, hallucinating. Not sleeping.  She has 2 daughters, 1 of them is with her at all times.  The daughter that stays with her most of the time is at her wits and if her mother cannot start sleeping at night.  She was on Ambien 5 mg at one time but Dr. Maudie Mercury took her off of it because he was concerned about her fall risk.  The daughter wonders if she can go back on it.  She has been sleeping in a recliner because she cannot breathe lying down. The [facility] recommended oxycodone (?) but the family only gave her 1 dose, she slept heavily and they had trouble waking her.  The regular home health nurse recommended keeping her on the oxygen  full-time and call and cardiology.   Past Medical History:  Diagnosis Date  . Atrial fibrillation ([place])    a. on Xarelto  . Chest pain   . COPD (chronic obstructive pulmonary disease) ([place])    family unsure if is copd,   . H/O echocardiogram    a. 08/2016: EF of 60-65%, severely dilated LA, mild pulmonic regurgitations, mild TR, and PA Pressure at 38 mm Hg  . History of depression   . History of DVT (deep vein thrombosis)   . History of pulmonary embolism   . Hyperlipidemia   . Hypertension   . Hypokalemia    Now improved with treatment  . Shortness of breath   . Stroke ([place])   . Tachy-brady syndrome ([place]) 04/01/2017   s/p MDT PPM     Past Surgical History:  Procedure Laterality Date  . PACEMAKER IMPLANT N/A 04/01/2017   Procedure: Pacemaker Implant;  Surgeon: Sanda Klein, MD;  Location: [facility];  Service: Cardiovascular;  Laterality: N/A; MDT Azure XT SR MRI  . REPLACEMENT TOTAL KNEE    . ROTATOR CUFF REPAIR      Current Outpatient Medications  Medication Sig Dispense Refill  . acetaminophen (TYLENOL) 325 MG tablet Take 650 mg by mouth every 6 (six) hours  as needed (for pain).     Marland Kitchen ALPRAZolam (XANAX) 0.25 MG tablet Take 0.25 mg by mouth 3 (three) times daily as needed for anxiety (anxiety).    . furosemide (LASIX) 20 MG tablet Take 1 tablet (20 mg total) by mouth daily as needed for fluid or edema.    Marland Kitchen MAGNESIUM PO Take by mouth.    . MELATONIN PO Take by mouth.    . metoprolol (LOPRESSOR) 25 MG tablet Take 1 tablet (25 mg total) by mouth 2 (two) times daily. 180 tablet 3  . Multiple Vitamins-Minerals (WOMENS 50+ MULTI VITAMIN/MIN) TABS Take 1 tablet by mouth daily.    . nitroGLYCERIN (NITROSTAT) 0.4 MG SL tablet Place 0.4 mg under the tongue every 5 (five) minutes as needed for chest pain. X 3 doses  0  . omeprazole (PRILOSEC) 20 MG capsule Take 20 mg by mouth daily as needed (for reflux/heartburn).   11  . oxyCODONE (OXY IR/ROXICODONE) 5 MG immediate  release tablet TAKE 1/2 TO 1 TABLET BY MOUTH EVERY 4 HOURS AS NEEDED FOR PAIN /SHORTNESS OF BREATH  0  . OXYGEN Inhale 2 L into the lungs continuous.     . potassium chloride SA (K-DUR,KLOR-CON) 20 MEQ tablet Take 1 tablet (20 mEq total) by mouth daily as needed (along with lasix.). 30 tablet 0  . pravastatin (PRAVACHOL) 40 MG tablet Take 40 mg by mouth every morning.     . venlafaxine XR (EFFEXOR-XR) 150 MG 24 hr capsule Take 150 mg by mouth daily.    Marland Kitchen venlafaxine XR (EFFEXOR-XR) 75 MG 24 hr capsule Take 1 capsule by mouth daily.    . Rivaroxaban (XARELTO) 15 MG TABS tablet Take 15 mg by mouth 2 (two) times daily with a meal. samples    . zolpidem (AMBIEN) 5 MG tablet Take 5 mg by mouth at bedtime.     No current facility-administered medications for this visit.     Allergies:   Patient has no known allergies.    Social History:  The patient  reports that she has never smoked. She has never used smokeless tobacco. She reports that she does not drink alcohol or use drugs.   Family History:  The patient's family history includes Alzheimer's disease in her mother; Diabetes type II in her daughter; Heart attack in her father; Heart disease in her daughter.    ROS:  Please see the history of present illness. All other systems are reviewed and negative.    PHYSICAL EXAM: VS:  BP 138/72 (BP Location: Left Arm)   Pulse (!) 45   Ht 5\' 1"  (1.549 m)   Wt 168 lb (76.2 kg)   BMI 31.74 kg/m  , BMI Body mass index is 31.74 kg/m. GEN: Well nourished, well developed, female in no acute distress  HEENT: normal for age  Neck: JVD 9-10 cm, no carotid bruit, no masses Cardiac: Irregular rate and rhythm; no murmur, no rubs, or gallops Respiratory: Rales bases bilaterally, normal work of breathing GI: soft, nontender, nondistended, + BS MS: no deformity or atrophy; 1+ lower extremity edema; distal pulses are 2+ in all 4 extremities   Skin: warm and dry, no rash Neuro:  Strength and sensation are  intact Psych: euthymic mood, full affect   EKG:  EKG is ordered today. The ekg ordered today demonstrates atrial fib, occasional paced beats, heart rate 45  ECHO: 11/07/2017 - Left ventricle: The cavity size was normal. There was severe   focal basal hypertrophy of the septum  with otherwise moderate   concentric hypertrophy. Systolic function was normal. The   estimated ejection fraction was in the range of 50% to 55%. Mild   hypokinesis of the anterior and anterolateral myocardium. - Aortic valve: Transvalvular velocity was within the normal range.   There was no stenosis. There was moderate regurgitation. Valve   area (Vmax): 1.59 cm^2. - Mitral valve: Transvalvular velocity was within the normal range.   There was no evidence for stenosis. There was mild regurgitation. - Left atrium: The atrium was severely dilated. - Right ventricle: The cavity size was normal. Wall thickness was   normal. Systolic function was normal. - Right atrium: The atrium was moderately dilated. - Tricuspid valve: There was moderate regurgitation. - Pulmonary arteries: Systolic pressure was mildly increased. PA   peak pressure: 39 mm Hg (S).   Recent Labs: 11/06/2017: ALT 8; B Natriuretic Peptide 450.1; Magnesium 1.8 11/07/2017: Hemoglobin 11.9; Platelets 171 11/08/2017: BUN 16; Creatinine, Ser 0.75; Potassium 3.7; Sodium 139    Lipid Panel    Component Value Date/Time   CHOL 115 01/09/2017 0356   TRIG 86 01/09/2017 0356   HDL 40 (L) 01/09/2017 0356   CHOLHDL 2.9 01/09/2017 0356   VLDL 17 01/09/2017 0356   LDLCALC 58 01/09/2017 0356     Wt Readings from Last 3 Encounters:  02/19/18 168 lb (76.2 kg)  11/27/17 173 lb (78.5 kg)  11/23/17 176 lb (79.8 kg)     Other studies Reviewed: Additional studies/ records that were reviewed today include: Office notes, hospital records and testing.  ASSESSMENT AND PLAN:  1.  Acute on chronic diastolic CHF: Her weight is actually down, but her volume  status has worsened. -Recent daily Lasix to 40 mg -However, for the next 3 days take 40 mg twice daily. -Continue current potassium dose of  One 20 mEq tablet for every 20 mg of Lasix.  That will work out to two 20 mEq tablets 4 every Lasix 40 mg  -Continue daily weights and a low-sodium diet. -Check a BMET today  2.  Permanent atrial fibrillation: Her heart rate is on the low side, but that is to minimize the pacing, she is asymptomatic from this.  Continue low-dose metoprolol.  3.  [employer] pacemaker: She is compliant with remote pacemaker checks, continue these.  4.  Mental status changes: It is unclear if this is acute or chronic.  The family is requested to call Dr. Maudie Mercury and see if it would be okay to give her half of an Ambien if she cannot sleep.  They are also requested to discuss with him her hallucinations to see if something else might be needed.  Family is looking at getting her into assisted living, this is being done through Dr. Julianne Rice office.  The daughter asked if we could provide any documentation.  I advised that we would be happy to send copies of notes, emphasizing the importance of medication compliance to keep her out of the hospital.   Current medicines are reviewed at length with the patient today.  The patient has concerns regarding medicines.  The following changes have been made: Increase Lasix  Labs/ tests ordered today include:   Orders Placed This Encounter  Procedures  . Basic metabolic panel  . EKG 12-Lead     Disposition:   FU with Dr. Gwenlyn Found or myself in 2 weeks  Signed, Rosaria Ferries, PA-C  02/19/2018 [facility] Phone: [phone]; Fax: [phone]  This note was written with the assistance of speech recognition software. Please excuse any transcriptional errors.

## 2018-02-20 LAB — BASIC METABOLIC PANEL
BUN/Creatinine Ratio: 20 (ref 12–28)
BUN: 18 mg/dL (ref 8–27)
CALCIUM: 8.9 mg/dL (ref 8.7–10.3)
CO2: 23 mmol/L (ref 20–29)
Chloride: 100 mmol/L (ref 96–106)
Creatinine, Ser: 0.9 mg/dL (ref 0.57–1.00)
GFR calc Af Amer: 66 mL/min/{1.73_m2} (ref >59)
GFR, EST NON AFRICAN AMERICAN: 57 mL/min/{1.73_m2} — AB (ref >59)
GLUCOSE: 106 mg/dL — AB (ref 65–99)
POTASSIUM: 4.1 mmol/L (ref 3.5–5.2)
SODIUM: 141 mmol/L (ref 134–144)

## 2018-02-24 DIAGNOSIS — G4700 Insomnia, unspecified: Secondary | ICD-10-CM | POA: Diagnosis not present

## 2018-02-25 ENCOUNTER — Ambulatory Visit: Payer: Medicare Other | Admitting: Cardiology

## 2018-02-26 DIAGNOSIS — I509 Heart failure, unspecified: Secondary | ICD-10-CM | POA: Diagnosis not present

## 2018-02-26 DIAGNOSIS — Z8673 Personal history of transient ischemic attack (TIA), and cerebral infarction without residual deficits: Secondary | ICD-10-CM | POA: Diagnosis not present

## 2018-02-26 DIAGNOSIS — J45909 Unspecified asthma, uncomplicated: Secondary | ICD-10-CM | POA: Diagnosis not present

## 2018-02-26 DIAGNOSIS — Z86718 Personal history of other venous thrombosis and embolism: Secondary | ICD-10-CM | POA: Diagnosis not present

## 2018-02-26 DIAGNOSIS — E119 Type 2 diabetes mellitus without complications: Secondary | ICD-10-CM | POA: Diagnosis not present

## 2018-02-26 DIAGNOSIS — M1990 Unspecified osteoarthritis, unspecified site: Secondary | ICD-10-CM | POA: Diagnosis not present

## 2018-02-26 DIAGNOSIS — G4700 Insomnia, unspecified: Secondary | ICD-10-CM | POA: Diagnosis not present

## 2018-02-26 DIAGNOSIS — Z950 Presence of cardiac pacemaker: Secondary | ICD-10-CM | POA: Diagnosis not present

## 2018-02-26 DIAGNOSIS — Z86711 Personal history of pulmonary embolism: Secondary | ICD-10-CM | POA: Diagnosis not present

## 2018-02-26 DIAGNOSIS — I480 Paroxysmal atrial fibrillation: Secondary | ICD-10-CM | POA: Diagnosis not present

## 2018-02-26 DIAGNOSIS — I110 Hypertensive heart disease with heart failure: Secondary | ICD-10-CM | POA: Diagnosis not present

## 2018-02-26 DIAGNOSIS — Z9981 Dependence on supplemental oxygen: Secondary | ICD-10-CM | POA: Diagnosis not present

## 2018-02-27 DIAGNOSIS — J449 Chronic obstructive pulmonary disease, unspecified: Secondary | ICD-10-CM | POA: Diagnosis not present

## 2018-03-01 ENCOUNTER — Ambulatory Visit (INDEPENDENT_AMBULATORY_CARE_PROVIDER_SITE_OTHER): Payer: Medicare Other | Admitting: Physician Assistant

## 2018-03-01 ENCOUNTER — Encounter: Payer: Self-pay | Admitting: Physician Assistant

## 2018-03-01 VITALS — BP 126/67 | HR 60 | Ht 61.0 in | Wt 157.0 lb

## 2018-03-01 DIAGNOSIS — I5031 Acute diastolic (congestive) heart failure: Secondary | ICD-10-CM | POA: Diagnosis not present

## 2018-03-01 DIAGNOSIS — I5032 Chronic diastolic (congestive) heart failure: Secondary | ICD-10-CM | POA: Diagnosis not present

## 2018-03-01 DIAGNOSIS — I482 Chronic atrial fibrillation, unspecified: Secondary | ICD-10-CM

## 2018-03-01 DIAGNOSIS — I10 Essential (primary) hypertension: Secondary | ICD-10-CM | POA: Diagnosis not present

## 2018-03-01 MED ORDER — METOPROLOL TARTRATE 25 MG PO TABS: 25.0000 mg | ORAL_TABLET | Freq: Every day | ORAL | 3 refills | Status: DC

## 2018-03-01 NOTE — Progress Notes (Signed)
Cardiology Office Note   Date:  03/01/2018   ID:  Raven Gray, DOB [birth year], MRN [id number]  PCP:  [name], MD  Cardiologist:  Dr Gwenlyn Found, 11/27/2017  Dr Croitoru for [facility] Rosaria Ferries, PA-C 02/19/2018  Chief Complaint  Patient presents with  . Follow-up  . Dizziness    History of Present Illness: Raven Gray is a 84 y.o. female with a history of  remote DVT/PE previously on Xarelto (stopped 10/2017 due to age and fall risk), HTN, HLD, COPD, FH CAD (father and daughter), Afib dx 2015 and now permanent, MV 2017 low risk, pauses on event monitor>>MDT PPM.   05/31 office visit for SOB, +volume>>Lasix 40 mg qd>>40 mg bid x 3 days, increase K+ also, f/u w/ Dr Maudie Mercury for MS changes, pt really needs assisted living.  [name] presents for cardiology follow up.  When she gets SOB with exertion, she will get nauseated and light-headed. It happened today, her heart rate was 70 and her O2 sat was 94%.  She she is sitting, her L arm will shake when she tries to do things. No asterixis. R arm less useful due to torn rotator cuff.   She still gets very SOB w/ minimal exertion. She was not originally on O2 all the time, but if she does not wear it, she will feel SOB and ask for it.    She has Home Health, PT/OT will start soon.   She will breathe fast when she exerts herself, sometimes will get tingling around her mouth when she does.   Weights at home are 157-159 over the last 3-4 days.   Her daughter is making sure she takes her medications.  Her daughter has a baby monitor that she uses to make sure her mother is ok at night.    Past Medical History:  Diagnosis Date  . Atrial fibrillation ([place])    a. on Xarelto  . Chest pain   . COPD (chronic obstructive pulmonary disease) ([place])    family unsure if is copd,   . H/O echocardiogram    a. 08/2016: EF of 60-65%, severely dilated LA, mild pulmonic regurgitations, mild TR, and PA Pressure at 38 mm Hg  . History of depression   .  History of DVT (deep vein thrombosis)   . History of pulmonary embolism   . Hyperlipidemia   . Hypertension   . Hypokalemia    Now improved with treatment  . Shortness of breath   . Stroke ([place])   . Tachy-brady syndrome ([place]) 04/01/2017   s/p MDT PPM     Past Surgical History:  Procedure Laterality Date  . PACEMAKER IMPLANT N/A 04/01/2017   Procedure: Pacemaker Implant;  Surgeon: Sanda Klein, MD;  Location: [facility];  Service: Cardiovascular;  Laterality: N/A; MDT Azure XT SR MRI  . REPLACEMENT TOTAL KNEE    . ROTATOR CUFF REPAIR      Current Outpatient Medications  Medication Sig Dispense Refill  . acetaminophen (TYLENOL) 325 MG tablet Take 650 mg by mouth every 6 (six) hours as needed (for pain).     Marland Kitchen ALPRAZolam (XANAX) 0.25 MG tablet Take 0.25 mg by mouth 3 (three) times daily as needed for anxiety (anxiety).    . furosemide (LASIX) 40 MG tablet Take 1 tablet (40 mg total) by mouth daily. Take 1 tablet (40 mg) two times a day for 3 days. Then 1 tablet by mouth daily. 90 tablet 3  . MAGNESIUM PO  Take by mouth.    . MELATONIN PO Take by mouth.    . metoprolol (LOPRESSOR) 25 MG tablet Take 1 tablet (25 mg total) by mouth 2 (two) times daily. 180 tablet 3  . Multiple Vitamins-Minerals (WOMENS 50+ MULTI VITAMIN/MIN) TABS Take 1 tablet by mouth daily.    . nitroGLYCERIN (NITROSTAT) 0.4 MG SL tablet Place 0.4 mg under the tongue every 5 (five) minutes as needed for chest pain. X 3 doses  0  . omeprazole (PRILOSEC) 20 MG capsule Take 20 mg by mouth daily as needed (for reflux/heartburn).   11  . OXYGEN Inhale 2 L into the lungs continuous.     . potassium chloride SA (K-DUR,KLOR-CON) 20 MEQ tablet Take 1 tablet (20 mEq total) by mouth daily as needed (along with lasix.). 30 tablet 0  . pravastatin (PRAVACHOL) 40 MG tablet Take 40 mg by mouth every morning.     . traZODone (DESYREL) 50 MG tablet Take 1 tablet by mouth at bedtime.  1  . venlafaxine XR (EFFEXOR-XR) 150 MG 24  hr capsule Take 150 mg by mouth daily.    Marland Kitchen venlafaxine XR (EFFEXOR-XR) 75 MG 24 hr capsule Take 1 capsule by mouth daily.     No current facility-administered medications for this visit.     Allergies:   Patient has no known allergies.    Social History:  The patient  reports that she has never smoked. She has never used smokeless tobacco. She reports that she does not drink alcohol or use drugs.   Family History:  The patient's family history includes Alzheimer's disease in her mother; Diabetes type II in her daughter; Heart attack in her father; Heart disease in her daughter.    ROS:  Please see the history of present illness. All other systems are reviewed and negative.    PHYSICAL EXAM: VS:  BP 126/67   Pulse 60   Ht 5\' 1"  (1.549 m)   Wt 157 lb (71.2 kg)   BMI 29.66 kg/m  , BMI Body mass index is 29.66 kg/m. GEN: Well nourished, well developed, female in no acute distress  HEENT: normal for age  Neck: JVD 8 cm, no carotid bruit, no masses Cardiac: Irregular rate and rhythm; diastolic murmur best heard at the right upper sternal border, no rubs, or gallops Respiratory: Some basilar rales bilaterally, normal work of breathing GI: soft, nontender, nondistended, + BS MS: no deformity or atrophy; no edema; distal pulses are 2+ in all 4 extremities   Skin: warm and dry, no rash Neuro:  Strength and sensation are at baseline Psych: euthymic mood, full affect   EKG:  EKG is not ordered today.  ECHO: 11/07/2017 - Left ventricle: The cavity size was normal. There was severe   focal basal hypertrophy of the septum with otherwise moderate   concentric hypertrophy. Systolic function was normal. The   estimated ejection fraction was in the range of 50% to 55%. Mild   hypokinesis of the anterior and anterolateral myocardium. - Aortic valve: Transvalvular velocity was within the normal range.   There was no stenosis. There was moderate regurgitation. Valve   area (Vmax): 1.59  cm^2. - Mitral valve: Transvalvular velocity was within the normal range.   There was no evidence for stenosis. There was mild regurgitation. - Left atrium: The atrium was severely dilated. - Right ventricle: The cavity size was normal. Wall thickness was   normal. Systolic function was normal. - Right atrium: The atrium was moderately dilated. -  Tricuspid valve: There was moderate regurgitation. - Pulmonary arteries: Systolic pressure was mildly increased. PA   peak pressure: 39 mm Hg (S).    Recent Labs: 11/06/2017: ALT 8; B Natriuretic Peptide 450.1; Magnesium 1.8 11/07/2017: Hemoglobin 11.9; Platelets 171 02/19/2018: BUN 18; Creatinine, Ser 0.90; Potassium 4.1; Sodium 141    Lipid Panel    Component Value Date/Time   CHOL 115 01/09/2017 0356   TRIG 86 01/09/2017 0356   HDL 40 (L) 01/09/2017 0356   CHOLHDL 2.9 01/09/2017 0356   VLDL 17 01/09/2017 0356   LDLCALC 58 01/09/2017 0356     Wt Readings from Last 3 Encounters:  03/01/18 157 lb (71.2 kg)  02/19/18 168 lb (76.2 kg)  11/27/17 173 lb (78.5 kg)   Other studies Reviewed: Additional studies/ records that were reviewed today include: Office notes, hospital records and testing  ASSESSMENT AND PLAN:  1.  Chronic diastolic CHF: Her weight is down 11 pounds.  Med and diet compliance is improved.  She still has significant dyspnea on exertion but in general feels better.  Continue current Lasix dose and check a BMET today  2.  Hypertension: The home health nurses started coming out and her metoprolol was increased for better blood pressure control.  Her blood pressure today is well controlled.  However, the dose of beta-blocker may be too high, see discussion below.  She was given a sheet to record blood pressures and heart rates.  3. Atrial fibrillation: With the episode this morning, her oxygen saturation was still acceptable at 94%, but her heart rate was only 70 and she was very short of breath.  I am concerned that the  increased dose of metoprolol has created a form of chronotropic incompetence despite the pacemaker.  I reviewed this possibility briefly with Dr. Sallyanne Kuster and he recommended her device be interrogated.  The interrogation showed that she is currently V pacing 67.5% of the time, where she was previously V pacing 20% of the time.  Dr. Sallyanne Kuster reviewed the results and feels that the increased beta-blocker may be responsible.  We will cut the beta-blocker back to her previous dose.  See if this helps her to increase her activity.  Hopefully, she will revert back to her previous ventricular pacing percentage.  If she still displays chronotropic incompetence, the sensitivity can be increased.  4.  Anticoagulation: CHA2DS2-VASc = 7 (age x 2, female, HTN, CHF, DVT x 2)  However, anticoagulation was previously stopped due to age and frailty.  It will not be restarted   Current medicines are reviewed at length with the patient today.  The patient does not have concerns regarding medicines.  The following changes have been made:    Labs/ tests ordered today include:   Orders Placed This Encounter  Procedures  . Basic Metabolic Panel (BMET)     Disposition:   FU with Dr Gwenlyn Found in 3 months and Dr. Sallyanne Kuster as scheduled  Signed, Rosaria Ferries, PA-C  03/01/2018 5:08 PM    [place] Phone: [phone]; Fax: [phone]  This note was written with the assistance of speech recognition software. Please excuse any transcriptional errors.

## 2018-03-01 NOTE — Patient Instructions (Addendum)
Medication Instructions:  DECREASE Metoprolol to one tablet once a day  Labwork: Your physician recommends that you return for lab work in: TODAY-BMET  Testing/Procedures: None   Follow-Up: Your physician recommends that you schedule a follow-up appointment in: 3 months with Dr Gwenlyn Found  Any Other Special Instructions Will Be Listed Below (If Applicable). 1. Keep pacer Checks  2. Goal Blood Pressure is 140/80; home health nurse can check blood pressure 3. Target weight is 150lbs 4. Eat a Low sodium diet  No more than 500mg  of Sodium per meal or no more than 2000mg  of Sodium per day. 5. Call the office if you have a cough or decide to have have CXR or Chest CT  If you need a refill on your cardiac medications before your next appointment, please call your pharmacy.

## 2018-03-02 DIAGNOSIS — Z8673 Personal history of transient ischemic attack (TIA), and cerebral infarction without residual deficits: Secondary | ICD-10-CM | POA: Diagnosis not present

## 2018-03-02 DIAGNOSIS — M1990 Unspecified osteoarthritis, unspecified site: Secondary | ICD-10-CM | POA: Diagnosis not present

## 2018-03-02 DIAGNOSIS — J45909 Unspecified asthma, uncomplicated: Secondary | ICD-10-CM | POA: Diagnosis not present

## 2018-03-02 DIAGNOSIS — I480 Paroxysmal atrial fibrillation: Secondary | ICD-10-CM | POA: Diagnosis not present

## 2018-03-02 DIAGNOSIS — Z9981 Dependence on supplemental oxygen: Secondary | ICD-10-CM | POA: Diagnosis not present

## 2018-03-02 DIAGNOSIS — Z950 Presence of cardiac pacemaker: Secondary | ICD-10-CM | POA: Diagnosis not present

## 2018-03-02 DIAGNOSIS — Z86711 Personal history of pulmonary embolism: Secondary | ICD-10-CM | POA: Diagnosis not present

## 2018-03-02 DIAGNOSIS — I509 Heart failure, unspecified: Secondary | ICD-10-CM | POA: Diagnosis not present

## 2018-03-02 DIAGNOSIS — G4700 Insomnia, unspecified: Secondary | ICD-10-CM | POA: Diagnosis not present

## 2018-03-02 DIAGNOSIS — I110 Hypertensive heart disease with heart failure: Secondary | ICD-10-CM | POA: Diagnosis not present

## 2018-03-02 DIAGNOSIS — E119 Type 2 diabetes mellitus without complications: Secondary | ICD-10-CM | POA: Diagnosis not present

## 2018-03-02 DIAGNOSIS — Z86718 Personal history of other venous thrombosis and embolism: Secondary | ICD-10-CM | POA: Diagnosis not present

## 2018-03-02 LAB — BASIC METABOLIC PANEL
BUN/Creatinine Ratio: 16 (ref 12–28)
BUN: 18 mg/dL (ref 8–27)
CHLORIDE: 95 mmol/L — AB (ref 96–106)
CO2: 30 mmol/L — AB (ref 20–29)
Calcium: 9 mg/dL (ref 8.7–10.3)
Creatinine, Ser: 1.13 mg/dL — ABNORMAL HIGH (ref 0.57–1.00)
GFR calc Af Amer: 50 mL/min/{1.73_m2} — ABNORMAL LOW (ref >59)
GFR, EST NON AFRICAN AMERICAN: 43 mL/min/{1.73_m2} — AB (ref >59)
Glucose: 106 mg/dL — ABNORMAL HIGH (ref 65–99)
POTASSIUM: 4.1 mmol/L (ref 3.5–5.2)
Sodium: 139 mmol/L (ref 134–144)

## 2018-03-03 DIAGNOSIS — G4700 Insomnia, unspecified: Secondary | ICD-10-CM | POA: Diagnosis not present

## 2018-03-03 DIAGNOSIS — E119 Type 2 diabetes mellitus without complications: Secondary | ICD-10-CM | POA: Diagnosis not present

## 2018-03-03 DIAGNOSIS — I509 Heart failure, unspecified: Secondary | ICD-10-CM | POA: Diagnosis not present

## 2018-03-03 DIAGNOSIS — Z86718 Personal history of other venous thrombosis and embolism: Secondary | ICD-10-CM | POA: Diagnosis not present

## 2018-03-03 DIAGNOSIS — I480 Paroxysmal atrial fibrillation: Secondary | ICD-10-CM | POA: Diagnosis not present

## 2018-03-03 DIAGNOSIS — Z86711 Personal history of pulmonary embolism: Secondary | ICD-10-CM | POA: Diagnosis not present

## 2018-03-03 DIAGNOSIS — Z8673 Personal history of transient ischemic attack (TIA), and cerebral infarction without residual deficits: Secondary | ICD-10-CM | POA: Diagnosis not present

## 2018-03-03 DIAGNOSIS — J45909 Unspecified asthma, uncomplicated: Secondary | ICD-10-CM | POA: Diagnosis not present

## 2018-03-03 DIAGNOSIS — I110 Hypertensive heart disease with heart failure: Secondary | ICD-10-CM | POA: Diagnosis not present

## 2018-03-03 DIAGNOSIS — Z950 Presence of cardiac pacemaker: Secondary | ICD-10-CM | POA: Diagnosis not present

## 2018-03-03 DIAGNOSIS — Z9981 Dependence on supplemental oxygen: Secondary | ICD-10-CM | POA: Diagnosis not present

## 2018-03-03 DIAGNOSIS — M1990 Unspecified osteoarthritis, unspecified site: Secondary | ICD-10-CM | POA: Diagnosis not present

## 2018-03-04 ENCOUNTER — Telehealth: Payer: Self-pay | Admitting: Physician Assistant

## 2018-03-04 DIAGNOSIS — Z79899 Other long term (current) drug therapy: Secondary | ICD-10-CM

## 2018-03-04 NOTE — Telephone Encounter (Signed)
Left message to call back  

## 2018-03-04 NOTE — Telephone Encounter (Signed)
Please let her know that she should continue the Lasix at 40 mg qd and the potassium Check a BMET in 2 weeks. The goal is that her weight stay stable, so if she is, that is good.  Any changes, let us know.  Thanks

## 2018-03-04 NOTE — Telephone Encounter (Signed)
New message    Pt is calling to ask if she maintains a weight for a few days does she still need to take the fluid pill?

## 2018-03-04 NOTE — Telephone Encounter (Signed)
Spoke with daughter and advised of [place], [place] recommendation. Verbalized understanding. Lab orders placed.

## 2018-03-04 NOTE — Telephone Encounter (Signed)
Spoke with daughter who reports pt has been maintaining a weight of 156-157 the last 3 days and was wondering if her mother needs to continue take the lasix and potassium daily. She states she doesn't want her mother to get to dry and concerned about her kidney. Routing to [employer], PA for recommendation.

## 2018-03-04 NOTE — Telephone Encounter (Signed)
Follow Up:; ° ° °Returning your call. °

## 2018-03-05 DIAGNOSIS — I509 Heart failure, unspecified: Secondary | ICD-10-CM | POA: Diagnosis not present

## 2018-03-05 DIAGNOSIS — E119 Type 2 diabetes mellitus without complications: Secondary | ICD-10-CM | POA: Diagnosis not present

## 2018-03-05 DIAGNOSIS — I480 Paroxysmal atrial fibrillation: Secondary | ICD-10-CM | POA: Diagnosis not present

## 2018-03-05 DIAGNOSIS — Z86711 Personal history of pulmonary embolism: Secondary | ICD-10-CM | POA: Diagnosis not present

## 2018-03-05 DIAGNOSIS — M1990 Unspecified osteoarthritis, unspecified site: Secondary | ICD-10-CM | POA: Diagnosis not present

## 2018-03-05 DIAGNOSIS — G4700 Insomnia, unspecified: Secondary | ICD-10-CM | POA: Diagnosis not present

## 2018-03-05 DIAGNOSIS — Z950 Presence of cardiac pacemaker: Secondary | ICD-10-CM | POA: Diagnosis not present

## 2018-03-05 DIAGNOSIS — Z86718 Personal history of other venous thrombosis and embolism: Secondary | ICD-10-CM | POA: Diagnosis not present

## 2018-03-05 DIAGNOSIS — Z8673 Personal history of transient ischemic attack (TIA), and cerebral infarction without residual deficits: Secondary | ICD-10-CM | POA: Diagnosis not present

## 2018-03-05 DIAGNOSIS — Z9981 Dependence on supplemental oxygen: Secondary | ICD-10-CM | POA: Diagnosis not present

## 2018-03-05 DIAGNOSIS — I110 Hypertensive heart disease with heart failure: Secondary | ICD-10-CM | POA: Diagnosis not present

## 2018-03-05 DIAGNOSIS — J45909 Unspecified asthma, uncomplicated: Secondary | ICD-10-CM | POA: Diagnosis not present

## 2018-03-08 ENCOUNTER — Telehealth: Payer: Self-pay | Admitting: Physician Assistant

## 2018-03-08 DIAGNOSIS — Z86711 Personal history of pulmonary embolism: Secondary | ICD-10-CM | POA: Diagnosis not present

## 2018-03-08 DIAGNOSIS — Z8673 Personal history of transient ischemic attack (TIA), and cerebral infarction without residual deficits: Secondary | ICD-10-CM | POA: Diagnosis not present

## 2018-03-08 DIAGNOSIS — Z950 Presence of cardiac pacemaker: Secondary | ICD-10-CM | POA: Diagnosis not present

## 2018-03-08 DIAGNOSIS — J45909 Unspecified asthma, uncomplicated: Secondary | ICD-10-CM | POA: Diagnosis not present

## 2018-03-08 DIAGNOSIS — I110 Hypertensive heart disease with heart failure: Secondary | ICD-10-CM | POA: Diagnosis not present

## 2018-03-08 DIAGNOSIS — Z86718 Personal history of other venous thrombosis and embolism: Secondary | ICD-10-CM | POA: Diagnosis not present

## 2018-03-08 DIAGNOSIS — E119 Type 2 diabetes mellitus without complications: Secondary | ICD-10-CM | POA: Diagnosis not present

## 2018-03-08 DIAGNOSIS — I480 Paroxysmal atrial fibrillation: Secondary | ICD-10-CM | POA: Diagnosis not present

## 2018-03-08 DIAGNOSIS — M1990 Unspecified osteoarthritis, unspecified site: Secondary | ICD-10-CM | POA: Diagnosis not present

## 2018-03-08 DIAGNOSIS — Z9981 Dependence on supplemental oxygen: Secondary | ICD-10-CM | POA: Diagnosis not present

## 2018-03-08 DIAGNOSIS — I509 Heart failure, unspecified: Secondary | ICD-10-CM | POA: Diagnosis not present

## 2018-03-08 DIAGNOSIS — G4700 Insomnia, unspecified: Secondary | ICD-10-CM | POA: Diagnosis not present

## 2018-03-08 NOTE — Telephone Encounter (Signed)
New message   DAUGHTER REPORTING A BAD COUGH  Pt c/o swelling: STAT is pt has developed SOB within 24 hours  1) How much weight have you gained and in what time span? n/a  2) If swelling, where is the swelling located? legs  3) Are you currently taking a fluid pill?  YES  4) Are you currently SOB? YES, wears 2L  5) Do you have a log of your daily weights (if so, list)? 154 today, Sunday 156, 160  6) Have you gained 3 pounds in a day or 5 pounds in a week?   7) Have you traveled recently? NO

## 2018-03-08 NOTE — Telephone Encounter (Signed)
Returned call to daughter [name]. She reports her mom developed a cough over the weekend, and she recalled Suanne Marker asking about this at her last visit. She is compliant with lasix 40mg  daily. She has LE swelling that is the same. Her breathing is the same. Weight has been stable (6/14 - 154lbs, 6/15 - 155lbs, 6/16 - 156lbs, 6/17 - 154lbs). She wanted to provide Suanne Marker this update and would like a call back with any of her recommendations.

## 2018-03-09 DIAGNOSIS — J45909 Unspecified asthma, uncomplicated: Secondary | ICD-10-CM | POA: Diagnosis not present

## 2018-03-09 DIAGNOSIS — I110 Hypertensive heart disease with heart failure: Secondary | ICD-10-CM | POA: Diagnosis not present

## 2018-03-09 DIAGNOSIS — M1990 Unspecified osteoarthritis, unspecified site: Secondary | ICD-10-CM | POA: Diagnosis not present

## 2018-03-09 DIAGNOSIS — E119 Type 2 diabetes mellitus without complications: Secondary | ICD-10-CM | POA: Diagnosis not present

## 2018-03-09 DIAGNOSIS — Z86711 Personal history of pulmonary embolism: Secondary | ICD-10-CM | POA: Diagnosis not present

## 2018-03-09 DIAGNOSIS — Z9981 Dependence on supplemental oxygen: Secondary | ICD-10-CM | POA: Diagnosis not present

## 2018-03-09 DIAGNOSIS — I480 Paroxysmal atrial fibrillation: Secondary | ICD-10-CM | POA: Diagnosis not present

## 2018-03-09 DIAGNOSIS — Z8673 Personal history of transient ischemic attack (TIA), and cerebral infarction without residual deficits: Secondary | ICD-10-CM | POA: Diagnosis not present

## 2018-03-09 DIAGNOSIS — Z950 Presence of cardiac pacemaker: Secondary | ICD-10-CM | POA: Diagnosis not present

## 2018-03-09 DIAGNOSIS — I509 Heart failure, unspecified: Secondary | ICD-10-CM | POA: Diagnosis not present

## 2018-03-09 DIAGNOSIS — G4700 Insomnia, unspecified: Secondary | ICD-10-CM | POA: Diagnosis not present

## 2018-03-09 DIAGNOSIS — Z86718 Personal history of other venous thrombosis and embolism: Secondary | ICD-10-CM | POA: Diagnosis not present

## 2018-03-09 NOTE — Telephone Encounter (Signed)
This covers both her BMET and the message:  Her weight is stable and her labs look like she is on the max tolerated dose of Lasix at this weight. I don't think we can increase the Lasix as long as her weight is the same.  She needs a BP check, and repeat walk test to make sure her heart rate increases appropriately.  Please arrange APP visit and call her. Thanks

## 2018-03-09 NOTE — Telephone Encounter (Signed)
Returned call to daughter with Suanne Marker, PA's advice & recommendations. She agreed w/plan & voiced understanding. Scheduled for PAOV on 7/9 @ 1030am

## 2018-03-10 DIAGNOSIS — M1990 Unspecified osteoarthritis, unspecified site: Secondary | ICD-10-CM | POA: Diagnosis not present

## 2018-03-10 DIAGNOSIS — I110 Hypertensive heart disease with heart failure: Secondary | ICD-10-CM | POA: Diagnosis not present

## 2018-03-10 DIAGNOSIS — Z86711 Personal history of pulmonary embolism: Secondary | ICD-10-CM | POA: Diagnosis not present

## 2018-03-10 DIAGNOSIS — I480 Paroxysmal atrial fibrillation: Secondary | ICD-10-CM | POA: Diagnosis not present

## 2018-03-10 DIAGNOSIS — G4700 Insomnia, unspecified: Secondary | ICD-10-CM | POA: Diagnosis not present

## 2018-03-10 DIAGNOSIS — J45909 Unspecified asthma, uncomplicated: Secondary | ICD-10-CM | POA: Diagnosis not present

## 2018-03-10 DIAGNOSIS — I509 Heart failure, unspecified: Secondary | ICD-10-CM | POA: Diagnosis not present

## 2018-03-10 DIAGNOSIS — E119 Type 2 diabetes mellitus without complications: Secondary | ICD-10-CM | POA: Diagnosis not present

## 2018-03-10 DIAGNOSIS — Z86718 Personal history of other venous thrombosis and embolism: Secondary | ICD-10-CM | POA: Diagnosis not present

## 2018-03-10 DIAGNOSIS — Z950 Presence of cardiac pacemaker: Secondary | ICD-10-CM | POA: Diagnosis not present

## 2018-03-10 DIAGNOSIS — R05 Cough: Secondary | ICD-10-CM | POA: Diagnosis not present

## 2018-03-10 DIAGNOSIS — Z8673 Personal history of transient ischemic attack (TIA), and cerebral infarction without residual deficits: Secondary | ICD-10-CM | POA: Diagnosis not present

## 2018-03-10 DIAGNOSIS — Z9981 Dependence on supplemental oxygen: Secondary | ICD-10-CM | POA: Diagnosis not present

## 2018-03-11 DIAGNOSIS — Z86718 Personal history of other venous thrombosis and embolism: Secondary | ICD-10-CM | POA: Diagnosis not present

## 2018-03-11 DIAGNOSIS — I110 Hypertensive heart disease with heart failure: Secondary | ICD-10-CM | POA: Diagnosis not present

## 2018-03-11 DIAGNOSIS — M1990 Unspecified osteoarthritis, unspecified site: Secondary | ICD-10-CM | POA: Diagnosis not present

## 2018-03-11 DIAGNOSIS — Z950 Presence of cardiac pacemaker: Secondary | ICD-10-CM | POA: Diagnosis not present

## 2018-03-11 DIAGNOSIS — I509 Heart failure, unspecified: Secondary | ICD-10-CM | POA: Diagnosis not present

## 2018-03-11 DIAGNOSIS — Z86711 Personal history of pulmonary embolism: Secondary | ICD-10-CM | POA: Diagnosis not present

## 2018-03-11 DIAGNOSIS — G4700 Insomnia, unspecified: Secondary | ICD-10-CM | POA: Diagnosis not present

## 2018-03-11 DIAGNOSIS — Z8673 Personal history of transient ischemic attack (TIA), and cerebral infarction without residual deficits: Secondary | ICD-10-CM | POA: Diagnosis not present

## 2018-03-11 DIAGNOSIS — Z9981 Dependence on supplemental oxygen: Secondary | ICD-10-CM | POA: Diagnosis not present

## 2018-03-11 DIAGNOSIS — I480 Paroxysmal atrial fibrillation: Secondary | ICD-10-CM | POA: Diagnosis not present

## 2018-03-11 DIAGNOSIS — J45909 Unspecified asthma, uncomplicated: Secondary | ICD-10-CM | POA: Diagnosis not present

## 2018-03-11 DIAGNOSIS — E119 Type 2 diabetes mellitus without complications: Secondary | ICD-10-CM | POA: Diagnosis not present

## 2018-03-12 DIAGNOSIS — Z8673 Personal history of transient ischemic attack (TIA), and cerebral infarction without residual deficits: Secondary | ICD-10-CM | POA: Diagnosis not present

## 2018-03-12 DIAGNOSIS — Z9981 Dependence on supplemental oxygen: Secondary | ICD-10-CM | POA: Diagnosis not present

## 2018-03-12 DIAGNOSIS — M1990 Unspecified osteoarthritis, unspecified site: Secondary | ICD-10-CM | POA: Diagnosis not present

## 2018-03-12 DIAGNOSIS — G4700 Insomnia, unspecified: Secondary | ICD-10-CM | POA: Diagnosis not present

## 2018-03-12 DIAGNOSIS — I480 Paroxysmal atrial fibrillation: Secondary | ICD-10-CM | POA: Diagnosis not present

## 2018-03-12 DIAGNOSIS — Z950 Presence of cardiac pacemaker: Secondary | ICD-10-CM | POA: Diagnosis not present

## 2018-03-12 DIAGNOSIS — J45909 Unspecified asthma, uncomplicated: Secondary | ICD-10-CM | POA: Diagnosis not present

## 2018-03-12 DIAGNOSIS — Z86718 Personal history of other venous thrombosis and embolism: Secondary | ICD-10-CM | POA: Diagnosis not present

## 2018-03-12 DIAGNOSIS — E119 Type 2 diabetes mellitus without complications: Secondary | ICD-10-CM | POA: Diagnosis not present

## 2018-03-12 DIAGNOSIS — I509 Heart failure, unspecified: Secondary | ICD-10-CM | POA: Diagnosis not present

## 2018-03-12 DIAGNOSIS — Z86711 Personal history of pulmonary embolism: Secondary | ICD-10-CM | POA: Diagnosis not present

## 2018-03-12 DIAGNOSIS — I110 Hypertensive heart disease with heart failure: Secondary | ICD-10-CM | POA: Diagnosis not present

## 2018-03-15 DIAGNOSIS — I509 Heart failure, unspecified: Secondary | ICD-10-CM | POA: Diagnosis not present

## 2018-03-15 DIAGNOSIS — M1990 Unspecified osteoarthritis, unspecified site: Secondary | ICD-10-CM | POA: Diagnosis not present

## 2018-03-15 DIAGNOSIS — I110 Hypertensive heart disease with heart failure: Secondary | ICD-10-CM | POA: Diagnosis not present

## 2018-03-15 DIAGNOSIS — Z86718 Personal history of other venous thrombosis and embolism: Secondary | ICD-10-CM | POA: Diagnosis not present

## 2018-03-15 DIAGNOSIS — Z86711 Personal history of pulmonary embolism: Secondary | ICD-10-CM | POA: Diagnosis not present

## 2018-03-15 DIAGNOSIS — G4700 Insomnia, unspecified: Secondary | ICD-10-CM | POA: Diagnosis not present

## 2018-03-15 DIAGNOSIS — Z9981 Dependence on supplemental oxygen: Secondary | ICD-10-CM | POA: Diagnosis not present

## 2018-03-15 DIAGNOSIS — Z950 Presence of cardiac pacemaker: Secondary | ICD-10-CM | POA: Diagnosis not present

## 2018-03-15 DIAGNOSIS — J45909 Unspecified asthma, uncomplicated: Secondary | ICD-10-CM | POA: Diagnosis not present

## 2018-03-15 DIAGNOSIS — E119 Type 2 diabetes mellitus without complications: Secondary | ICD-10-CM | POA: Diagnosis not present

## 2018-03-15 DIAGNOSIS — I480 Paroxysmal atrial fibrillation: Secondary | ICD-10-CM | POA: Diagnosis not present

## 2018-03-15 DIAGNOSIS — Z8673 Personal history of transient ischemic attack (TIA), and cerebral infarction without residual deficits: Secondary | ICD-10-CM | POA: Diagnosis not present

## 2018-03-16 DIAGNOSIS — I509 Heart failure, unspecified: Secondary | ICD-10-CM | POA: Diagnosis not present

## 2018-03-16 DIAGNOSIS — J988 Other specified respiratory disorders: Secondary | ICD-10-CM | POA: Diagnosis not present

## 2018-03-16 DIAGNOSIS — R0602 Shortness of breath: Secondary | ICD-10-CM | POA: Diagnosis not present

## 2018-03-16 DIAGNOSIS — Z9981 Dependence on supplemental oxygen: Secondary | ICD-10-CM | POA: Diagnosis not present

## 2018-03-16 DIAGNOSIS — J449 Chronic obstructive pulmonary disease, unspecified: Secondary | ICD-10-CM | POA: Diagnosis not present

## 2018-03-16 DIAGNOSIS — R05 Cough: Secondary | ICD-10-CM | POA: Diagnosis not present

## 2018-03-17 DIAGNOSIS — Z86711 Personal history of pulmonary embolism: Secondary | ICD-10-CM | POA: Diagnosis not present

## 2018-03-17 DIAGNOSIS — I480 Paroxysmal atrial fibrillation: Secondary | ICD-10-CM | POA: Diagnosis not present

## 2018-03-17 DIAGNOSIS — J45909 Unspecified asthma, uncomplicated: Secondary | ICD-10-CM | POA: Diagnosis not present

## 2018-03-17 DIAGNOSIS — I110 Hypertensive heart disease with heart failure: Secondary | ICD-10-CM | POA: Diagnosis not present

## 2018-03-17 DIAGNOSIS — E119 Type 2 diabetes mellitus without complications: Secondary | ICD-10-CM | POA: Diagnosis not present

## 2018-03-17 DIAGNOSIS — Z86718 Personal history of other venous thrombosis and embolism: Secondary | ICD-10-CM | POA: Diagnosis not present

## 2018-03-17 DIAGNOSIS — Z8673 Personal history of transient ischemic attack (TIA), and cerebral infarction without residual deficits: Secondary | ICD-10-CM | POA: Diagnosis not present

## 2018-03-17 DIAGNOSIS — G4700 Insomnia, unspecified: Secondary | ICD-10-CM | POA: Diagnosis not present

## 2018-03-17 DIAGNOSIS — Z950 Presence of cardiac pacemaker: Secondary | ICD-10-CM | POA: Diagnosis not present

## 2018-03-17 DIAGNOSIS — I509 Heart failure, unspecified: Secondary | ICD-10-CM | POA: Diagnosis not present

## 2018-03-17 DIAGNOSIS — Z9981 Dependence on supplemental oxygen: Secondary | ICD-10-CM | POA: Diagnosis not present

## 2018-03-17 DIAGNOSIS — M1990 Unspecified osteoarthritis, unspecified site: Secondary | ICD-10-CM | POA: Diagnosis not present

## 2018-03-18 DIAGNOSIS — Z86718 Personal history of other venous thrombosis and embolism: Secondary | ICD-10-CM | POA: Diagnosis not present

## 2018-03-18 DIAGNOSIS — I110 Hypertensive heart disease with heart failure: Secondary | ICD-10-CM | POA: Diagnosis not present

## 2018-03-18 DIAGNOSIS — E119 Type 2 diabetes mellitus without complications: Secondary | ICD-10-CM | POA: Diagnosis not present

## 2018-03-18 DIAGNOSIS — Z950 Presence of cardiac pacemaker: Secondary | ICD-10-CM | POA: Diagnosis not present

## 2018-03-18 DIAGNOSIS — J45909 Unspecified asthma, uncomplicated: Secondary | ICD-10-CM | POA: Diagnosis not present

## 2018-03-18 DIAGNOSIS — I509 Heart failure, unspecified: Secondary | ICD-10-CM | POA: Diagnosis not present

## 2018-03-18 DIAGNOSIS — Z9981 Dependence on supplemental oxygen: Secondary | ICD-10-CM | POA: Diagnosis not present

## 2018-03-18 DIAGNOSIS — Z8673 Personal history of transient ischemic attack (TIA), and cerebral infarction without residual deficits: Secondary | ICD-10-CM | POA: Diagnosis not present

## 2018-03-18 DIAGNOSIS — Z86711 Personal history of pulmonary embolism: Secondary | ICD-10-CM | POA: Diagnosis not present

## 2018-03-18 DIAGNOSIS — M1990 Unspecified osteoarthritis, unspecified site: Secondary | ICD-10-CM | POA: Diagnosis not present

## 2018-03-18 DIAGNOSIS — G4700 Insomnia, unspecified: Secondary | ICD-10-CM | POA: Diagnosis not present

## 2018-03-18 DIAGNOSIS — Z79899 Other long term (current) drug therapy: Secondary | ICD-10-CM | POA: Diagnosis not present

## 2018-03-18 DIAGNOSIS — I480 Paroxysmal atrial fibrillation: Secondary | ICD-10-CM | POA: Diagnosis not present

## 2018-03-19 DIAGNOSIS — Z9981 Dependence on supplemental oxygen: Secondary | ICD-10-CM | POA: Diagnosis not present

## 2018-03-19 DIAGNOSIS — J45909 Unspecified asthma, uncomplicated: Secondary | ICD-10-CM | POA: Diagnosis not present

## 2018-03-19 DIAGNOSIS — M1990 Unspecified osteoarthritis, unspecified site: Secondary | ICD-10-CM | POA: Diagnosis not present

## 2018-03-19 DIAGNOSIS — I110 Hypertensive heart disease with heart failure: Secondary | ICD-10-CM | POA: Diagnosis not present

## 2018-03-19 DIAGNOSIS — E119 Type 2 diabetes mellitus without complications: Secondary | ICD-10-CM | POA: Diagnosis not present

## 2018-03-19 DIAGNOSIS — Z86718 Personal history of other venous thrombosis and embolism: Secondary | ICD-10-CM | POA: Diagnosis not present

## 2018-03-19 DIAGNOSIS — Z86711 Personal history of pulmonary embolism: Secondary | ICD-10-CM | POA: Diagnosis not present

## 2018-03-19 DIAGNOSIS — Z8673 Personal history of transient ischemic attack (TIA), and cerebral infarction without residual deficits: Secondary | ICD-10-CM | POA: Diagnosis not present

## 2018-03-19 DIAGNOSIS — Z950 Presence of cardiac pacemaker: Secondary | ICD-10-CM | POA: Diagnosis not present

## 2018-03-19 DIAGNOSIS — I480 Paroxysmal atrial fibrillation: Secondary | ICD-10-CM | POA: Diagnosis not present

## 2018-03-19 DIAGNOSIS — G4700 Insomnia, unspecified: Secondary | ICD-10-CM | POA: Diagnosis not present

## 2018-03-19 DIAGNOSIS — I509 Heart failure, unspecified: Secondary | ICD-10-CM | POA: Diagnosis not present

## 2018-03-19 LAB — BASIC METABOLIC PANEL
BUN/Creatinine Ratio: 21 (ref 12–28)
BUN: 17 mg/dL (ref 8–27)
CHLORIDE: 98 mmol/L (ref 96–106)
CO2: 28 mmol/L (ref 20–29)
Calcium: 9.5 mg/dL (ref 8.7–10.3)
Creatinine, Ser: 0.81 mg/dL (ref 0.57–1.00)
GFR calc Af Amer: 75 mL/min/{1.73_m2} (ref >59)
GFR calc non Af Amer: 65 mL/min/{1.73_m2} (ref >59)
GLUCOSE: 84 mg/dL (ref 65–99)
POTASSIUM: 4.7 mmol/L (ref 3.5–5.2)
SODIUM: 140 mmol/L (ref 134–144)

## 2018-03-22 DIAGNOSIS — I110 Hypertensive heart disease with heart failure: Secondary | ICD-10-CM | POA: Diagnosis not present

## 2018-03-22 DIAGNOSIS — Z9981 Dependence on supplemental oxygen: Secondary | ICD-10-CM | POA: Diagnosis not present

## 2018-03-22 DIAGNOSIS — I480 Paroxysmal atrial fibrillation: Secondary | ICD-10-CM | POA: Diagnosis not present

## 2018-03-22 DIAGNOSIS — Z86711 Personal history of pulmonary embolism: Secondary | ICD-10-CM | POA: Diagnosis not present

## 2018-03-22 DIAGNOSIS — J441 Chronic obstructive pulmonary disease with (acute) exacerbation: Secondary | ICD-10-CM | POA: Diagnosis not present

## 2018-03-22 DIAGNOSIS — Z950 Presence of cardiac pacemaker: Secondary | ICD-10-CM | POA: Diagnosis not present

## 2018-03-22 DIAGNOSIS — E119 Type 2 diabetes mellitus without complications: Secondary | ICD-10-CM | POA: Diagnosis not present

## 2018-03-22 DIAGNOSIS — M1990 Unspecified osteoarthritis, unspecified site: Secondary | ICD-10-CM | POA: Diagnosis not present

## 2018-03-22 DIAGNOSIS — Z86718 Personal history of other venous thrombosis and embolism: Secondary | ICD-10-CM | POA: Diagnosis not present

## 2018-03-22 DIAGNOSIS — Z8673 Personal history of transient ischemic attack (TIA), and cerebral infarction without residual deficits: Secondary | ICD-10-CM | POA: Diagnosis not present

## 2018-03-22 DIAGNOSIS — J45909 Unspecified asthma, uncomplicated: Secondary | ICD-10-CM | POA: Diagnosis not present

## 2018-03-22 DIAGNOSIS — G4700 Insomnia, unspecified: Secondary | ICD-10-CM | POA: Diagnosis not present

## 2018-03-22 DIAGNOSIS — K819 Cholecystitis, unspecified: Secondary | ICD-10-CM

## 2018-03-22 DIAGNOSIS — I509 Heart failure, unspecified: Secondary | ICD-10-CM | POA: Diagnosis not present

## 2018-03-22 HISTORY — DX: Cholecystitis, unspecified: K81.9

## 2018-03-23 DIAGNOSIS — J410 Simple chronic bronchitis: Secondary | ICD-10-CM | POA: Diagnosis not present

## 2018-03-23 DIAGNOSIS — Z950 Presence of cardiac pacemaker: Secondary | ICD-10-CM | POA: Diagnosis not present

## 2018-03-23 DIAGNOSIS — J45909 Unspecified asthma, uncomplicated: Secondary | ICD-10-CM | POA: Diagnosis not present

## 2018-03-23 DIAGNOSIS — Z86718 Personal history of other venous thrombosis and embolism: Secondary | ICD-10-CM | POA: Diagnosis not present

## 2018-03-23 DIAGNOSIS — Z86711 Personal history of pulmonary embolism: Secondary | ICD-10-CM | POA: Diagnosis not present

## 2018-03-23 DIAGNOSIS — I509 Heart failure, unspecified: Secondary | ICD-10-CM | POA: Diagnosis not present

## 2018-03-23 DIAGNOSIS — I480 Paroxysmal atrial fibrillation: Secondary | ICD-10-CM | POA: Diagnosis not present

## 2018-03-23 DIAGNOSIS — M1990 Unspecified osteoarthritis, unspecified site: Secondary | ICD-10-CM | POA: Diagnosis not present

## 2018-03-23 DIAGNOSIS — E119 Type 2 diabetes mellitus without complications: Secondary | ICD-10-CM | POA: Diagnosis not present

## 2018-03-23 DIAGNOSIS — Z8673 Personal history of transient ischemic attack (TIA), and cerebral infarction without residual deficits: Secondary | ICD-10-CM | POA: Diagnosis not present

## 2018-03-23 DIAGNOSIS — Z9981 Dependence on supplemental oxygen: Secondary | ICD-10-CM | POA: Diagnosis not present

## 2018-03-23 DIAGNOSIS — I110 Hypertensive heart disease with heart failure: Secondary | ICD-10-CM | POA: Diagnosis not present

## 2018-03-23 DIAGNOSIS — G4700 Insomnia, unspecified: Secondary | ICD-10-CM | POA: Diagnosis not present

## 2018-03-24 DIAGNOSIS — G4700 Insomnia, unspecified: Secondary | ICD-10-CM | POA: Diagnosis not present

## 2018-03-24 DIAGNOSIS — I509 Heart failure, unspecified: Secondary | ICD-10-CM | POA: Diagnosis not present

## 2018-03-24 DIAGNOSIS — Z8673 Personal history of transient ischemic attack (TIA), and cerebral infarction without residual deficits: Secondary | ICD-10-CM | POA: Diagnosis not present

## 2018-03-24 DIAGNOSIS — Z9981 Dependence on supplemental oxygen: Secondary | ICD-10-CM | POA: Diagnosis not present

## 2018-03-24 DIAGNOSIS — J45909 Unspecified asthma, uncomplicated: Secondary | ICD-10-CM | POA: Diagnosis not present

## 2018-03-24 DIAGNOSIS — I480 Paroxysmal atrial fibrillation: Secondary | ICD-10-CM | POA: Diagnosis not present

## 2018-03-24 DIAGNOSIS — Z86711 Personal history of pulmonary embolism: Secondary | ICD-10-CM | POA: Diagnosis not present

## 2018-03-24 DIAGNOSIS — I110 Hypertensive heart disease with heart failure: Secondary | ICD-10-CM | POA: Diagnosis not present

## 2018-03-24 DIAGNOSIS — Z950 Presence of cardiac pacemaker: Secondary | ICD-10-CM | POA: Diagnosis not present

## 2018-03-24 DIAGNOSIS — E119 Type 2 diabetes mellitus without complications: Secondary | ICD-10-CM | POA: Diagnosis not present

## 2018-03-24 DIAGNOSIS — Z86718 Personal history of other venous thrombosis and embolism: Secondary | ICD-10-CM | POA: Diagnosis not present

## 2018-03-24 DIAGNOSIS — M1990 Unspecified osteoarthritis, unspecified site: Secondary | ICD-10-CM | POA: Diagnosis not present

## 2018-03-26 DIAGNOSIS — J45909 Unspecified asthma, uncomplicated: Secondary | ICD-10-CM | POA: Diagnosis not present

## 2018-03-26 DIAGNOSIS — I509 Heart failure, unspecified: Secondary | ICD-10-CM | POA: Diagnosis not present

## 2018-03-26 DIAGNOSIS — E119 Type 2 diabetes mellitus without complications: Secondary | ICD-10-CM | POA: Diagnosis not present

## 2018-03-26 DIAGNOSIS — Z8673 Personal history of transient ischemic attack (TIA), and cerebral infarction without residual deficits: Secondary | ICD-10-CM | POA: Diagnosis not present

## 2018-03-26 DIAGNOSIS — M1990 Unspecified osteoarthritis, unspecified site: Secondary | ICD-10-CM | POA: Diagnosis not present

## 2018-03-26 DIAGNOSIS — Z950 Presence of cardiac pacemaker: Secondary | ICD-10-CM | POA: Diagnosis not present

## 2018-03-26 DIAGNOSIS — Z9981 Dependence on supplemental oxygen: Secondary | ICD-10-CM | POA: Diagnosis not present

## 2018-03-26 DIAGNOSIS — I480 Paroxysmal atrial fibrillation: Secondary | ICD-10-CM | POA: Diagnosis not present

## 2018-03-26 DIAGNOSIS — Z86711 Personal history of pulmonary embolism: Secondary | ICD-10-CM | POA: Diagnosis not present

## 2018-03-26 DIAGNOSIS — Z86718 Personal history of other venous thrombosis and embolism: Secondary | ICD-10-CM | POA: Diagnosis not present

## 2018-03-26 DIAGNOSIS — G4700 Insomnia, unspecified: Secondary | ICD-10-CM | POA: Diagnosis not present

## 2018-03-26 DIAGNOSIS — I110 Hypertensive heart disease with heart failure: Secondary | ICD-10-CM | POA: Diagnosis not present

## 2018-03-29 ENCOUNTER — Inpatient Hospital Stay (HOSPITAL_COMMUNITY): Payer: Medicare Other | Admitting: Certified Registered Nurse Anesthetist

## 2018-03-29 ENCOUNTER — Encounter (HOSPITAL_COMMUNITY): Payer: Self-pay | Admitting: *Deleted

## 2018-03-29 ENCOUNTER — Emergency Department (HOSPITAL_COMMUNITY): Payer: Medicare Other

## 2018-03-29 ENCOUNTER — Inpatient Hospital Stay (HOSPITAL_COMMUNITY)
Admission: EM | Admit: 2018-03-29 | Discharge: 2018-04-01 | DRG: 854 | Disposition: A | Discharge status: Skilled Nursing Facility | Payer: Medicare Other | Attending: Internal Medicine | Admitting: Internal Medicine

## 2018-03-29 ENCOUNTER — Other Ambulatory Visit: Payer: Self-pay

## 2018-03-29 ENCOUNTER — Encounter (HOSPITAL_COMMUNITY)
Admission: EM | Disposition: A | Discharge status: Skilled Nursing Facility | Payer: Self-pay | Source: Home / Self Care | Attending: Internal Medicine

## 2018-03-29 ENCOUNTER — Inpatient Hospital Stay (HOSPITAL_COMMUNITY): Payer: Medicare Other

## 2018-03-29 DIAGNOSIS — Z86718 Personal history of other venous thrombosis and embolism: Secondary | ICD-10-CM | POA: Diagnosis not present

## 2018-03-29 DIAGNOSIS — I495 Sick sinus syndrome: Secondary | ICD-10-CM | POA: Diagnosis present

## 2018-03-29 DIAGNOSIS — Z90710 Acquired absence of both cervix and uterus: Secondary | ICD-10-CM

## 2018-03-29 DIAGNOSIS — J449 Chronic obstructive pulmonary disease, unspecified: Secondary | ICD-10-CM | POA: Diagnosis not present

## 2018-03-29 DIAGNOSIS — I213 ST elevation (STEMI) myocardial infarction of unspecified site: Secondary | ICD-10-CM | POA: Diagnosis not present

## 2018-03-29 DIAGNOSIS — J439 Emphysema, unspecified: Secondary | ICD-10-CM | POA: Diagnosis not present

## 2018-03-29 DIAGNOSIS — I4510 Unspecified right bundle-branch block: Secondary | ICD-10-CM | POA: Diagnosis not present

## 2018-03-29 DIAGNOSIS — I482 Chronic atrial fibrillation, unspecified: Secondary | ICD-10-CM | POA: Diagnosis present

## 2018-03-29 DIAGNOSIS — M6389 Disorders of muscle in diseases classified elsewhere, multiple sites: Secondary | ICD-10-CM | POA: Diagnosis not present

## 2018-03-29 DIAGNOSIS — F329 Major depressive disorder, single episode, unspecified: Secondary | ICD-10-CM | POA: Diagnosis present

## 2018-03-29 DIAGNOSIS — R1314 Dysphagia, pharyngoesophageal phase: Secondary | ICD-10-CM | POA: Diagnosis not present

## 2018-03-29 DIAGNOSIS — Z950 Presence of cardiac pacemaker: Secondary | ICD-10-CM

## 2018-03-29 DIAGNOSIS — Z6824 Body mass index (BMI) 24.0-24.9, adult: Secondary | ICD-10-CM | POA: Diagnosis not present

## 2018-03-29 DIAGNOSIS — R109 Unspecified abdominal pain: Secondary | ICD-10-CM | POA: Diagnosis not present

## 2018-03-29 DIAGNOSIS — A419 Sepsis, unspecified organism: Secondary | ICD-10-CM | POA: Diagnosis not present

## 2018-03-29 DIAGNOSIS — K8020 Calculus of gallbladder without cholecystitis without obstruction: Secondary | ICD-10-CM | POA: Diagnosis not present

## 2018-03-29 DIAGNOSIS — R112 Nausea with vomiting, unspecified: Secondary | ICD-10-CM | POA: Diagnosis not present

## 2018-03-29 DIAGNOSIS — F0390 Unspecified dementia without behavioral disturbance: Secondary | ICD-10-CM | POA: Diagnosis present

## 2018-03-29 DIAGNOSIS — E876 Hypokalemia: Secondary | ICD-10-CM | POA: Diagnosis not present

## 2018-03-29 DIAGNOSIS — Z79899 Other long term (current) drug therapy: Secondary | ICD-10-CM

## 2018-03-29 DIAGNOSIS — I10 Essential (primary) hypertension: Secondary | ICD-10-CM | POA: Diagnosis present

## 2018-03-29 DIAGNOSIS — K8000 Calculus of gallbladder with acute cholecystitis without obstruction: Secondary | ICD-10-CM | POA: Diagnosis present

## 2018-03-29 DIAGNOSIS — I110 Hypertensive heart disease with heart failure: Secondary | ICD-10-CM | POA: Diagnosis present

## 2018-03-29 DIAGNOSIS — Z8673 Personal history of transient ischemic attack (TIA), and cerebral infarction without residual deficits: Secondary | ICD-10-CM

## 2018-03-29 DIAGNOSIS — Z86711 Personal history of pulmonary embolism: Secondary | ICD-10-CM

## 2018-03-29 DIAGNOSIS — R1010 Upper abdominal pain, unspecified: Secondary | ICD-10-CM | POA: Diagnosis present

## 2018-03-29 DIAGNOSIS — Z66 Do not resuscitate: Secondary | ICD-10-CM | POA: Diagnosis not present

## 2018-03-29 DIAGNOSIS — F419 Anxiety disorder, unspecified: Secondary | ICD-10-CM | POA: Diagnosis present

## 2018-03-29 DIAGNOSIS — E785 Hyperlipidemia, unspecified: Secondary | ICD-10-CM | POA: Diagnosis not present

## 2018-03-29 DIAGNOSIS — Z419 Encounter for procedure for purposes other than remedying health state, unspecified: Secondary | ICD-10-CM

## 2018-03-29 DIAGNOSIS — I700 Atherosclerosis of aorta: Secondary | ICD-10-CM | POA: Diagnosis not present

## 2018-03-29 DIAGNOSIS — E872 Acidosis: Secondary | ICD-10-CM | POA: Diagnosis present

## 2018-03-29 DIAGNOSIS — Z8249 Family history of ischemic heart disease and other diseases of the circulatory system: Secondary | ICD-10-CM

## 2018-03-29 DIAGNOSIS — I472 Ventricular tachycardia: Secondary | ICD-10-CM | POA: Diagnosis present

## 2018-03-29 DIAGNOSIS — R627 Adult failure to thrive: Secondary | ICD-10-CM | POA: Diagnosis present

## 2018-03-29 DIAGNOSIS — K449 Diaphragmatic hernia without obstruction or gangrene: Secondary | ICD-10-CM | POA: Diagnosis present

## 2018-03-29 DIAGNOSIS — K219 Gastro-esophageal reflux disease without esophagitis: Secondary | ICD-10-CM | POA: Diagnosis present

## 2018-03-29 DIAGNOSIS — I4821 Permanent atrial fibrillation: Secondary | ICD-10-CM

## 2018-03-29 DIAGNOSIS — K810 Acute cholecystitis: Secondary | ICD-10-CM

## 2018-03-29 DIAGNOSIS — Z9981 Dependence on supplemental oxygen: Secondary | ICD-10-CM | POA: Diagnosis not present

## 2018-03-29 DIAGNOSIS — I4891 Unspecified atrial fibrillation: Secondary | ICD-10-CM | POA: Diagnosis not present

## 2018-03-29 DIAGNOSIS — I5032 Chronic diastolic (congestive) heart failure: Secondary | ICD-10-CM | POA: Diagnosis present

## 2018-03-29 DIAGNOSIS — R2681 Unsteadiness on feet: Secondary | ICD-10-CM | POA: Diagnosis not present

## 2018-03-29 DIAGNOSIS — M6281 Muscle weakness (generalized): Secondary | ICD-10-CM | POA: Diagnosis not present

## 2018-03-29 DIAGNOSIS — Z79890 Hormone replacement therapy (postmenopausal): Secondary | ICD-10-CM

## 2018-03-29 DIAGNOSIS — R1084 Generalized abdominal pain: Secondary | ICD-10-CM | POA: Diagnosis not present

## 2018-03-29 DIAGNOSIS — C23 Malignant neoplasm of gallbladder: Secondary | ICD-10-CM | POA: Diagnosis not present

## 2018-03-29 DIAGNOSIS — Z9181 History of falling: Secondary | ICD-10-CM

## 2018-03-29 DIAGNOSIS — I5033 Acute on chronic diastolic (congestive) heart failure: Secondary | ICD-10-CM | POA: Diagnosis not present

## 2018-03-29 DIAGNOSIS — J9611 Chronic respiratory failure with hypoxia: Secondary | ICD-10-CM | POA: Diagnosis not present

## 2018-03-29 DIAGNOSIS — I959 Hypotension, unspecified: Secondary | ICD-10-CM | POA: Diagnosis not present

## 2018-03-29 DIAGNOSIS — R278 Other lack of coordination: Secondary | ICD-10-CM | POA: Diagnosis not present

## 2018-03-29 DIAGNOSIS — K8010 Calculus of gallbladder with chronic cholecystitis without obstruction: Secondary | ICD-10-CM | POA: Diagnosis not present

## 2018-03-29 HISTORY — DX: Prediabetes: R73.03

## 2018-03-29 HISTORY — DX: Cholecystitis, unspecified: K81.9

## 2018-03-29 HISTORY — DX: Unspecified dementia, unspecified severity, without behavioral disturbance, psychotic disturbance, mood disturbance, and anxiety: F03.90

## 2018-03-29 HISTORY — PX: CHOLECYSTECTOMY: SHX55

## 2018-03-29 LAB — CBC
HCT: 48.8 % — ABNORMAL HIGH (ref 36.0–46.0)
Hemoglobin: 14.4 g/dL (ref 12.0–15.0)
MCH: 25.4 pg — ABNORMAL LOW (ref 26.0–34.0)
MCHC: 29.5 g/dL — ABNORMAL LOW (ref 30.0–36.0)
MCV: 85.9 fL (ref 78.0–100.0)
PLATELETS: 215 10*3/uL (ref 150–400)
RBC: 5.68 MIL/uL — AB (ref 3.87–5.11)
RDW: 17.5 % — ABNORMAL HIGH (ref 11.5–15.5)
WBC: 12.7 10*3/uL — ABNORMAL HIGH (ref 4.0–10.5)

## 2018-03-29 LAB — COMPREHENSIVE METABOLIC PANEL
ALT: 21 U/L (ref 0–44)
AST: 22 U/L (ref 15–41)
Albumin: 3.5 g/dL (ref 3.5–5.0)
Alkaline Phosphatase: 86 U/L (ref 38–126)
Anion gap: 10 (ref 5–15)
BUN: 22 mg/dL (ref 8–23)
CHLORIDE: 102 mmol/L (ref 98–111)
CO2: 30 mmol/L (ref 22–32)
CREATININE: 1 mg/dL (ref 0.44–1.00)
Calcium: 9.2 mg/dL (ref 8.9–10.3)
GFR calc non Af Amer: 49 mL/min — ABNORMAL LOW (ref >60)
GFR, EST AFRICAN AMERICAN: 57 mL/min — AB (ref >60)
Glucose, Bld: 174 mg/dL — ABNORMAL HIGH (ref 70–99)
Potassium: 4 mmol/L (ref 3.5–5.1)
SODIUM: 142 mmol/L (ref 135–145)
Total Bilirubin: 0.8 mg/dL (ref 0.3–1.2)
Total Protein: 6.7 g/dL (ref 6.5–8.1)

## 2018-03-29 LAB — MAGNESIUM: MAGNESIUM: 2 mg/dL (ref 1.7–2.4)

## 2018-03-29 LAB — I-STAT CHEM 8, ED
BUN: 25 mg/dL — ABNORMAL HIGH (ref 8–23)
Calcium, Ion: 1.05 mmol/L — ABNORMAL LOW (ref 1.15–1.40)
Chloride: 99 mmol/L (ref 98–111)
Creatinine, Ser: 0.9 mg/dL (ref 0.44–1.00)
GLUCOSE: 167 mg/dL — AB (ref 70–99)
HCT: 49 % — ABNORMAL HIGH (ref 36.0–46.0)
Hemoglobin: 16.7 g/dL — ABNORMAL HIGH (ref 12.0–15.0)
POTASSIUM: 3.8 mmol/L (ref 3.5–5.1)
Sodium: 139 mmol/L (ref 135–145)
TCO2: 31 mmol/L (ref 22–32)

## 2018-03-29 LAB — PROCALCITONIN

## 2018-03-29 LAB — URINALYSIS, ROUTINE W REFLEX MICROSCOPIC
BILIRUBIN URINE: NEGATIVE
Glucose, UA: NEGATIVE mg/dL
Ketones, ur: NEGATIVE mg/dL
Nitrite: NEGATIVE
Protein, ur: 30 mg/dL — AB
RBC / HPF: >50 RBC/hpf — ABNORMAL HIGH (ref 0–5)
WBC, UA: >50 WBC/hpf — ABNORMAL HIGH (ref 0–5)
pH: 6 (ref 5.0–8.0)

## 2018-03-29 LAB — I-STAT CG4 LACTIC ACID, ED
LACTIC ACID, VENOUS: 1.96 mmol/L — AB (ref 0.5–1.9)
LACTIC ACID, VENOUS: 2.78 mmol/L — AB (ref 0.5–1.9)

## 2018-03-29 LAB — SURGICAL PCR SCREEN
MRSA, PCR: NEGATIVE
Staphylococcus aureus: NEGATIVE

## 2018-03-29 LAB — I-STAT TROPONIN, ED: Troponin i, poc: 0.01 ng/mL (ref 0.00–0.08)

## 2018-03-29 LAB — BRAIN NATRIURETIC PEPTIDE: B Natriuretic Peptide: 811.5 pg/mL — ABNORMAL HIGH (ref 0.0–100.0)

## 2018-03-29 LAB — LACTIC ACID, PLASMA
LACTIC ACID, VENOUS: 3.3 mmol/L — AB (ref 0.5–1.9)
Lactic Acid, Venous: 3.8 mmol/L (ref 0.5–1.9)

## 2018-03-29 SURGERY — LAPAROSCOPIC CHOLECYSTECTOMY
Anesthesia: General | Site: Abdomen

## 2018-03-29 MED ORDER — BUPIVACAINE-EPINEPHRINE (PF) 0.25% -1:200000 IJ SOLN
INTRAMUSCULAR | Status: AC
  Filled 2018-03-29: qty 30

## 2018-03-29 MED ORDER — VENLAFAXINE HCL ER 75 MG PO CP24: 225.0000 mg | ORAL_CAPSULE | Freq: Every day | ORAL | Status: DC

## 2018-03-29 MED ORDER — 0.9 % SODIUM CHLORIDE (POUR BTL) OPTIME
TOPICAL | Status: DC | PRN
  Administered 2018-03-29: 1000 mL

## 2018-03-29 MED ORDER — FENTANYL CITRATE (PF) 100 MCG/2ML IJ SOLN
50.0000 ug | Freq: Once | INTRAMUSCULAR | Status: AC
  Administered 2018-03-29: 50 ug via INTRAVENOUS
  Filled 2018-03-29: qty 2

## 2018-03-29 MED ORDER — FENTANYL CITRATE (PF) 100 MCG/2ML IJ SOLN
12.5000 ug | INTRAMUSCULAR | Status: DC | PRN
  Administered 2018-03-31: 12.5 ug via INTRAVENOUS
  Filled 2018-03-29: qty 2

## 2018-03-29 MED ORDER — LIDOCAINE 2% (20 MG/ML) 5 ML SYRINGE
INTRAMUSCULAR | Status: DC | PRN
  Administered 2018-03-29: 80 mg via INTRAVENOUS

## 2018-03-29 MED ORDER — DEXAMETHASONE SODIUM PHOSPHATE 10 MG/ML IJ SOLN
INTRAMUSCULAR | Status: DC | PRN
  Administered 2018-03-29: 5 mg via INTRAVENOUS

## 2018-03-29 MED ORDER — FAMOTIDINE IN NACL 20-0.9 MG/50ML-% IV SOLN
20.0000 mg | Freq: Two times a day (BID) | INTRAVENOUS | Status: DC
  Administered 2018-03-29 – 2018-04-01 (×7): 20 mg via INTRAVENOUS
  Filled 2018-03-29 (×7): qty 50

## 2018-03-29 MED ORDER — METOPROLOL TARTRATE 5 MG/5ML IV SOLN
5.0000 mg | Freq: Two times a day (BID) | INTRAVENOUS | Status: DC
  Administered 2018-03-29 – 2018-03-30 (×3): 5 mg via INTRAVENOUS
  Filled 2018-03-29 (×3): qty 5

## 2018-03-29 MED ORDER — PHENYLEPHRINE 40 MCG/ML (10ML) SYRINGE FOR IV PUSH (FOR BLOOD PRESSURE SUPPORT)
PREFILLED_SYRINGE | INTRAVENOUS | Status: DC | PRN
  Administered 2018-03-29: 80 ug via INTRAVENOUS
  Administered 2018-03-29: 120 ug via INTRAVENOUS

## 2018-03-29 MED ORDER — IOPAMIDOL (ISOVUE-300) INJECTION 61%
INTRAVENOUS | Status: DC | PRN
  Administered 2018-03-29: 10 mL

## 2018-03-29 MED ORDER — LORAZEPAM 2 MG/ML IJ SOLN
0.5000 mg | Freq: Four times a day (QID) | INTRAMUSCULAR | Status: DC | PRN
  Administered 2018-03-31: 0.5 mg via INTRAVENOUS
  Filled 2018-03-29: qty 1

## 2018-03-29 MED ORDER — SUGAMMADEX SODIUM 200 MG/2ML IV SOLN
INTRAVENOUS | Status: DC | PRN
  Administered 2018-03-29: 200 mg via INTRAVENOUS

## 2018-03-29 MED ORDER — ONDANSETRON HCL 4 MG/2ML IJ SOLN: 4.0000 mg | Freq: Once | INTRAMUSCULAR | Status: DC | PRN

## 2018-03-29 MED ORDER — PROPOFOL 10 MG/ML IV BOLUS
INTRAVENOUS | Status: DC | PRN
  Administered 2018-03-29: 100 mg via INTRAVENOUS

## 2018-03-29 MED ORDER — ROCURONIUM BROMIDE 10 MG/ML (PF) SYRINGE
PREFILLED_SYRINGE | INTRAVENOUS | Status: DC | PRN
  Administered 2018-03-29: 50 mg via INTRAVENOUS
  Administered 2018-03-29: 20 mg via INTRAVENOUS

## 2018-03-29 MED ORDER — FENTANYL CITRATE (PF) 250 MCG/5ML IJ SOLN
INTRAMUSCULAR | Status: AC
  Filled 2018-03-29: qty 5

## 2018-03-29 MED ORDER — ONDANSETRON HCL 4 MG/2ML IJ SOLN
INTRAMUSCULAR | Status: DC | PRN
  Administered 2018-03-29: 4 mg via INTRAVENOUS

## 2018-03-29 MED ORDER — VENLAFAXINE HCL ER 75 MG PO CP24
225.0000 mg | ORAL_CAPSULE | Freq: Every day | ORAL | Status: DC
  Administered 2018-03-29 – 2018-04-01 (×4): 225 mg via ORAL
  Filled 2018-03-29: qty 3
  Filled 2018-03-29: qty 1
  Filled 2018-03-29 (×2): qty 3

## 2018-03-29 MED ORDER — BUPIVACAINE-EPINEPHRINE 0.25% -1:200000 IJ SOLN
INTRAMUSCULAR | Status: DC | PRN
  Administered 2018-03-29: 30 mL

## 2018-03-29 MED ORDER — ALPRAZOLAM 0.25 MG PO TABS
0.2500 mg | ORAL_TABLET | Freq: Once | ORAL | Status: AC
  Administered 2018-03-29: 0.25 mg via ORAL
  Filled 2018-03-29: qty 1

## 2018-03-29 MED ORDER — IOPAMIDOL (ISOVUE-300) INJECTION 61%
INTRAVENOUS | Status: AC
  Filled 2018-03-29: qty 50

## 2018-03-29 MED ORDER — FENTANYL CITRATE (PF) 100 MCG/2ML IJ SOLN: 25.0000 ug | INTRAMUSCULAR | Status: DC | PRN

## 2018-03-29 MED ORDER — ONDANSETRON HCL 4 MG/2ML IJ SOLN
4.0000 mg | Freq: Once | INTRAMUSCULAR | Status: AC
  Administered 2018-03-29: 4 mg via INTRAVENOUS
  Filled 2018-03-29: qty 2

## 2018-03-29 MED ORDER — TRAZODONE HCL 50 MG PO TABS
50.0000 mg | ORAL_TABLET | Freq: Every day | ORAL | Status: DC
  Administered 2018-03-29 – 2018-03-31 (×3): 50 mg via ORAL
  Filled 2018-03-29 (×3): qty 1

## 2018-03-29 MED ORDER — PIPERACILLIN-TAZOBACTAM 3.375 G IVPB 30 MIN
3.3750 g | Freq: Once | INTRAVENOUS | Status: AC
  Administered 2018-03-29: 3.375 g via INTRAVENOUS
  Filled 2018-03-29: qty 50

## 2018-03-29 MED ORDER — SODIUM CHLORIDE 0.9 % IV BOLUS
500.0000 mL | Freq: Once | INTRAVENOUS | Status: AC
  Administered 2018-03-29: 500 mL via INTRAVENOUS

## 2018-03-29 MED ORDER — SODIUM CHLORIDE 0.9 % IR SOLN
Status: DC | PRN
  Administered 2018-03-29: 1000 mL

## 2018-03-29 MED ORDER — PRAVASTATIN SODIUM 40 MG PO TABS
40.0000 mg | ORAL_TABLET | Freq: Every morning | ORAL | Status: DC
  Administered 2018-03-30 – 2018-04-01 (×3): 40 mg via ORAL
  Filled 2018-03-29 (×3): qty 1

## 2018-03-29 MED ORDER — FENTANYL CITRATE (PF) 250 MCG/5ML IJ SOLN
INTRAMUSCULAR | Status: DC | PRN
  Administered 2018-03-29 (×3): 50 ug via INTRAVENOUS

## 2018-03-29 MED ORDER — MUPIROCIN 2 % EX OINT
TOPICAL_OINTMENT | CUTANEOUS | Status: AC
  Administered 2018-03-29: 14:00:00
  Filled 2018-03-29: qty 22

## 2018-03-29 MED ORDER — IPRATROPIUM-ALBUTEROL 0.5-2.5 (3) MG/3ML IN SOLN: 3.0000 mL | Freq: Four times a day (QID) | RESPIRATORY_TRACT | Status: DC | PRN

## 2018-03-29 MED ORDER — PROMETHAZINE HCL 25 MG/ML IJ SOLN
12.5000 mg | Freq: Four times a day (QID) | INTRAMUSCULAR | Status: DC | PRN
  Administered 2018-03-29: 12.5 mg via INTRAVENOUS
  Filled 2018-03-29: qty 1

## 2018-03-29 MED ORDER — IOPAMIDOL (ISOVUE-370) INJECTION 76%
100.0000 mL | Freq: Once | INTRAVENOUS | Status: AC | PRN
  Administered 2018-03-29: 100 mL via INTRAVENOUS

## 2018-03-29 MED ORDER — HYDROCODONE-ACETAMINOPHEN 5-325 MG PO TABS
1.0000 | ORAL_TABLET | Freq: Four times a day (QID) | ORAL | Status: DC | PRN
  Administered 2018-03-30 (×2): 1 via ORAL
  Filled 2018-03-29 (×2): qty 1

## 2018-03-29 MED ORDER — ONDANSETRON HCL 4 MG/2ML IJ SOLN: 4.0000 mg | Freq: Four times a day (QID) | INTRAMUSCULAR | Status: DC | PRN

## 2018-03-29 MED ORDER — MONTELUKAST SODIUM 10 MG PO TABS
10.0000 mg | ORAL_TABLET | Freq: Every day | ORAL | Status: DC
  Administered 2018-03-29 – 2018-03-31 (×3): 10 mg via ORAL
  Filled 2018-03-29 (×3): qty 1

## 2018-03-29 MED ORDER — FENTANYL CITRATE (PF) 100 MCG/2ML IJ SOLN
25.0000 ug | Freq: Once | INTRAMUSCULAR | Status: AC
  Administered 2018-03-29: 25 ug via INTRAVENOUS
  Filled 2018-03-29: qty 2

## 2018-03-29 MED ORDER — HYDRALAZINE HCL 20 MG/ML IJ SOLN
5.0000 mg | INTRAMUSCULAR | Status: DC | PRN
  Administered 2018-03-29: 5 mg via INTRAVENOUS
  Filled 2018-03-29: qty 1

## 2018-03-29 MED ORDER — LACTATED RINGERS IV SOLN
INTRAVENOUS | Status: DC
  Administered 2018-03-29 – 2018-04-01 (×7): via INTRAVENOUS

## 2018-03-29 MED ORDER — ACETAMINOPHEN 325 MG PO TABS
650.0000 mg | ORAL_TABLET | Freq: Four times a day (QID) | ORAL | Status: DC | PRN
  Administered 2018-03-29: 650 mg via ORAL
  Filled 2018-03-29: qty 2

## 2018-03-29 MED ORDER — ACETAMINOPHEN 650 MG RE SUPP: 650.0000 mg | Freq: Four times a day (QID) | RECTAL | Status: DC | PRN

## 2018-03-29 MED ORDER — PIPERACILLIN-TAZOBACTAM 3.375 G IVPB
3.3750 g | Freq: Three times a day (TID) | INTRAVENOUS | Status: DC
  Administered 2018-03-29 – 2018-04-01 (×9): 3.375 g via INTRAVENOUS
  Filled 2018-03-29 (×11): qty 50

## 2018-03-29 SURGICAL SUPPLY — 47 items
ADH SKN CLS APL DERMABOND .7 (GAUZE/BANDAGES/DRESSINGS) ×1
APPLIER CLIP 5 13 M/L LIGAMAX5 (MISCELLANEOUS) ×3
APPLIER CLIP ROT 10 11.4 M/L (STAPLE) ×3
APR CLP MED LRG 11.4X10 (STAPLE) ×1
APR CLP MED LRG 5 ANG JAW (MISCELLANEOUS) ×1
BAG SPEC RTRVL 10 TROC 200 (ENDOMECHANICALS) ×1
BLADE CLIPPER SURG (BLADE) IMPLANT
CANISTER SUCT 3000ML PPV (MISCELLANEOUS) ×3 IMPLANT
CHLORAPREP W/TINT 26ML (MISCELLANEOUS) ×3 IMPLANT
CHOLANGIOGRAM CATH TAUT (CATHETERS) ×2 IMPLANT
CLIP APPLIE 5 13 M/L LIGAMAX5 (MISCELLANEOUS) IMPLANT
CLIP APPLIE ROT 10 11.4 M/L (STAPLE) IMPLANT
COVER MAYO STAND STRL (DRAPES) ×2 IMPLANT
COVER SURGICAL LIGHT HANDLE (MISCELLANEOUS) ×3 IMPLANT
DERMABOND ADVANCED (GAUZE/BANDAGES/DRESSINGS) ×2
DERMABOND ADVANCED .7 DNX12 (GAUZE/BANDAGES/DRESSINGS) ×1 IMPLANT
DRAPE C-ARM 42X72 X-RAY (DRAPES) ×2 IMPLANT
ELECT REM PT RETURN 9FT ADLT (ELECTROSURGICAL) ×3
ELECTRODE REM PT RTRN 9FT ADLT (ELECTROSURGICAL) ×1 IMPLANT
FILTER SMOKE EVAC LAPAROSHD (FILTER) ×3 IMPLANT
GLOVE SURG SIGNA 7.5 PF LTX (GLOVE) ×3 IMPLANT
GOWN STRL REUS W/ TWL LRG LVL3 (GOWN DISPOSABLE) ×2 IMPLANT
GOWN STRL REUS W/ TWL XL LVL3 (GOWN DISPOSABLE) ×1 IMPLANT
GOWN STRL REUS W/TWL LRG LVL3 (GOWN DISPOSABLE) ×6
GOWN STRL REUS W/TWL XL LVL3 (GOWN DISPOSABLE) ×3
IV CATH 14GX2 1/4 (CATHETERS) ×2 IMPLANT
KIT BASIN OR (CUSTOM PROCEDURE TRAY) ×3 IMPLANT
KIT TURNOVER KIT B (KITS) ×3 IMPLANT
NS IRRIG 1000ML POUR BTL (IV SOLUTION) ×3 IMPLANT
PAD ARMBOARD 7.5X6 YLW CONV (MISCELLANEOUS) ×3 IMPLANT
POUCH RETRIEVAL ECOSAC 10 (ENDOMECHANICALS) ×1 IMPLANT
POUCH RETRIEVAL ECOSAC 10MM (ENDOMECHANICALS) ×2
SCISSORS LAP 5X35 DISP (ENDOMECHANICALS) ×3 IMPLANT
SET IRRIG TUBING LAPAROSCOPIC (IRRIGATION / IRRIGATOR) ×3 IMPLANT
SLEEVE ENDOPATH XCEL 5M (ENDOMECHANICALS) ×3 IMPLANT
SPECIMEN JAR SMALL (MISCELLANEOUS) ×3 IMPLANT
STOPCOCK 4 WAY LG BORE MALE ST (IV SETS) ×2 IMPLANT
SUT MNCRL AB 4-0 PS2 18 (SUTURE) ×3 IMPLANT
SUT VICRYL 0 UR6 27IN ABS (SUTURE) ×2 IMPLANT
TOWEL OR 17X24 6PK STRL BLUE (TOWEL DISPOSABLE) ×3 IMPLANT
TOWEL OR 17X26 10 PK STRL BLUE (TOWEL DISPOSABLE) ×3 IMPLANT
TRAY LAPAROSCOPIC MC (CUSTOM PROCEDURE TRAY) ×3 IMPLANT
TROCAR XCEL BLUNT TIP 100MML (ENDOMECHANICALS) ×3 IMPLANT
TROCAR XCEL NON-BLD 11X100MML (ENDOMECHANICALS) ×2 IMPLANT
TROCAR XCEL NON-BLD 5MMX100MML (ENDOMECHANICALS) ×3 IMPLANT
TUBING INSUFFLATION (TUBING) ×3 IMPLANT
WATER STERILE IRR 1000ML POUR (IV SOLUTION) ×3 IMPLANT

## 2018-03-29 NOTE — Progress Notes (Signed)
Pharmacy Antibiotic Note  Raven Gray is a 84 y.o. female admitted on 03/29/2018 with intra-abdominal infection.  Pharmacy has been consulted for Zosyn dosing.  Plan: Zosyn 3.375g IV q8h (4 hour infusion).  Follow c/s, clinical progression, renal function, LOT  Height: 5\' 7"  (170.2 cm) Weight: 157 lb (71.2 kg) IBW/kg (Calculated) : 61.6  No data recorded.  Recent Labs  Lab 03/29/18 0131 03/29/18 0144 03/29/18 0442  WBC 12.7*  --   --   CREATININE 1.00 0.90  --   LATICACIDVEN  --  1.96* 2.78*    Estimated Creatinine Clearance: 42 mL/min (by C-G formula based on SCr of 0.9 mg/dL).    No Known Allergies  Antimicrobials this admission: Zosyn 7/8 >>   Dose adjustments this admission: n/a  Microbiology results: 7/8 BCx:    Thank you for allowing pharmacy to be a part of this patient's care.  Lajeana Strough D. Zaiya Annunziato, PharmD, BCPS Clinical Pharmacist [phone] Please check AMION for all [place] numbers 03/29/2018 5:00 AM

## 2018-03-29 NOTE — Op Note (Signed)
03/29/2018  5:39 PM  PATIENT:  AISSA LISOWSKI, 84 y.o., female, MRN: [id number]  PREOP DIAGNOSIS:  CHOLECYSTITS  POSTOP DIAGNOSIS:   Chronic cholecystitis, cholelithiasis, abnormal location of appendix (laying over duodenum), hiatal hernia [photos at the end of the note]  PROCEDURE:   Procedure(s): LAPAROSCOPIC CHOLECYSTECTOMY WITH INTRAOPERATIVE CHOLANGIOGRAM  SURGEON:   Alphonsa Overall, M.D.  ASSISTANT:   None  ANESTHESIA:   general  Anesthesiologist: Catalina Gravel, MD CRNA: Bryson Corona, CRNA  General  ASA: 4E  EBL:  minimal  ml  BLOOD ADMINISTERED: none  DRAINS: none   LOCAL MEDICATIONS USED:   30 cc of 1/4% marcaine  SPECIMEN:   Gall bladder  COUNTS CORRECT:  YES  INDICATIONS FOR PROCEDURE:  THURMA PRIEGO is a 84 y.o. (DOB: Feb 07, 1928) white female whose primary care physician is Janie Morning, DO and comes for cholecystectomy.  She had presented acutely with abdominal/back pain to the [facility] ER last PM at midnight.  Her evaluation has revealed a large gall stone with mild gall bladder wall thickening.   The indications and risks of the gall bladder surgery were explained to the patient.  The risks include, but are not limited to, infection, bleeding, common bile duct injury and open surgery.  SURGERY:  The patient was taken to OR room #1 at [facility].  The abdomen was prepped with chloroprep.  The patient was on Zosyn prior to the beginning of the operation.   A time out was held and the surgical checklist run.   An infraumbilical incision was made into the abdominal cavity.  A 12 mm Hasson trocar was inserted into the abdominal cavity through the infraumbilical incision and secured with a 0 Vicryl suture.  Three additional trocars were inserted: a 10 mm trocar in the sub-xiphoid location, a 5 mm trocar in the right mid subcostal area, and a 5 mm trocar in the right lateral subcostal area.   The abdomen was explored and the liver, stomach, and bowel that could  be seen were unremarkable.  The patient's appendix lay over the duodenum, just underneath the falciform ligament.  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   The gall bladder was thickened and lay very lateral on the right side.   I grasped the gall bladder and rotated it cephalad.  Disssection was carried down to the gall bladder/cystic duct junction and the cystic duct isolated.  A clip was placed on the gall bladder side of the cystic duct.   An intra-operative cholangiogram was shot.   The intra-operative cholangiogram was shot using a cut off Taut catheter placed through a 14 gauge angiocath in the RUQ.  The Taut catheter was inserted in the cut cystic duct and secured with an endoclip.  A cholangiogram was shot with 10 cc of 1/2 strength Isoview.  Using fluoroscopy, the cholangiogram showed the flow of contrast into the common bile duct which was large, up the hepatic radicals, and a small amount into the duodenum.  There was no mass or obstruction.  This was a normal intra-operative cholangiogram.   The Taut catheter was removed.  The cystic duct was tripley endoclipped and the cystic artery was identified and clipped.  The gall bladder was bluntly and sharpley dissected from the gall bladder bed.   After the gall bladder was removed from the liver, the gall bladder bed and Tri[place] of [place] were inspected.  There was no bleeding or bile leak.  The gall bladder was  placed in a endocatch bag and delivered through the umbilicus.  The abdomen was irrigated with 800 cc saline.   The trocars were then removed.  I infiltrated 30cc of 1/4% Marcaine into the incisions.  The umbilical port closed with a 0 Vicryl suture and the skin closed with 4-0 Monocryl.  The skin was painted with DermaBond.  The patient's sponge and needle count were correct.  The patient was transported to the RR in good condition.   Hiatal hernia   Abnormal location of the appendix (note liver, gall bladder bed, and  falciform ligament)   Gall bladder bed (cystic duct noted)   Alphonsa Overall, MD, [facility] Surgery Pager: [phone] Office phone:  [phone]

## 2018-03-29 NOTE — ED Triage Notes (Signed)
The pt arrived by gems from homw  She ate dinner around 2000  Around 2130 she  Started c/o abd  Pain with nausea vomiting  She had zofran iv by ems and amiodarone on the way here    Iv lt forearm

## 2018-03-29 NOTE — ED Notes (Signed)
Attempted report x1. 

## 2018-03-29 NOTE — ED Notes (Signed)
Dr Leonette Monarch informed of lactic acid results 2.78

## 2018-03-29 NOTE — ED Notes (Signed)
C/o pain in her abd and her posterior chest

## 2018-03-29 NOTE — ED Notes (Signed)
Admitting MD at bedside. Pt vomiting and c/o ABD pain. New orders received.

## 2018-03-29 NOTE — Anesthesia Preprocedure Evaluation (Addendum)
Anesthesia Evaluation  Patient identified by MRN, date of birth, ID band Patient awake    Reviewed: Allergy & Precautions, NPO status , Patient's Chart, lab work & pertinent test results, reviewed documented beta blocker date and time   Airway Mallampati: II  TM Distance: >3 FB Neck ROM: Full    Dental  (+) Dental Advisory Given, Edentulous Upper, Edentulous Lower   Pulmonary COPD,    Pulmonary exam normal breath sounds clear to auscultation       Cardiovascular hypertension, Pt. on home beta blockers + dysrhythmias Atrial Fibrillation + pacemaker (s/p MDT PPM )  Rhythm:Irregular Rate:Normal     Neuro/Psych PSYCHIATRIC DISORDERS Depression Dementia TIACVA    GI/Hepatic GERD  Medicated and Controlled,Cholecystitis   Endo/Other  negative endocrine ROS  Renal/GU negative Renal ROS     Musculoskeletal  (+) Arthritis ,   Abdominal   Peds  Hematology negative hematology ROS (+)   Anesthesia Other Findings Day of surgery medications reviewed with the patient.  Reproductive/Obstetrics                            Anesthesia Physical Anesthesia Plan  ASA: IV  Anesthesia Plan: General   Post-op Pain Management:    Induction: Intravenous and Rapid sequence  PONV Risk Score and Plan: 4 or greater and Dexamethasone, Ondansetron and Treatment may vary due to age or medical condition  Airway Management Planned: Oral ETT  Additional Equipment:   Intra-op Plan:   Post-operative Plan: Extubation in OR  Informed Consent: I have reviewed the patients History and Physical, chart, labs and discussed the procedure including the risks, benefits and alternatives for the proposed anesthesia with the patient or authorized representative who has indicated his/her understanding and acceptance.   Dental advisory given  Plan Discussed with: CRNA  Anesthesia Plan Comments: (DNR Status discussed with  patient. Patient wishes to have DNR wishes respected in perioperative period.  Reconfirmed patient wishes with patient's daughter at bedside.)       Anesthesia Quick Evaluation

## 2018-03-29 NOTE — Anesthesia Procedure Notes (Signed)
Procedure Name: Intubation Date/Time: 03/29/2018 4:14 PM Performed by: Bryson Corona, CRNA Pre-anesthesia Checklist: Patient identified, Emergency Drugs available, Suction available and Patient being monitored Patient Re-evaluated:Patient Re-evaluated prior to induction Oxygen Delivery Method: Circle System Utilized Preoxygenation: Pre-oxygenation with 100% oxygen Induction Type: IV induction Ventilation: Oral airway inserted - appropriate to patient size Laryngoscope Size: Mac and 3 Grade View: Grade I Tube type: Oral Tube size: 7.0 mm Number of attempts: 1 Airway Equipment and Method: Stylet and Oral airway Placement Confirmation: ETT inserted through vocal cords under direct vision,  positive ETCO2 and breath sounds checked- equal and bilateral Secured at: 22 cm Tube secured with: Tape Dental Injury: Teeth and Oropharynx as per pre-operative assessment

## 2018-03-29 NOTE — Progress Notes (Signed)
Patient arrived to unit 5 [place] with family. Patient sleepy, family oriented to unit and explained procedures and policies. Patient notes to have only bruising. All other skin intact.

## 2018-03-29 NOTE — ED Notes (Signed)
Zosyn infused [id number]

## 2018-03-29 NOTE — Transfer of Care (Signed)
Immediate Anesthesia Transfer of Care Note  Patient: Raven Gray  Procedure(s) Performed: LAPAROSCOPIC CHOLECYSTECTOMY WITH INTRAOPERATIVE CHOLANGIOGRAM (N/A Abdomen)  Patient Location: PACU  Anesthesia Type:General  Level of Consciousness: awake and alert   Airway & Oxygen Therapy: Patient Spontanous Breathing and Patient connected to nasal cannula oxygen  Post-op Assessment: Report given to RN and Post -op Vital signs reviewed and stable  Post vital signs: Reviewed and stable  Last Vitals:  Vitals Value Taken Time  BP 165/60 03/29/2018  5:50 PM  Temp    Pulse 89 03/29/2018  5:50 PM  Resp 17 03/29/2018  5:51 PM  SpO2 98 % 03/29/2018  5:50 PM  Vitals shown include unvalidated device data.  Last Pain:  Vitals:   03/29/18 1215  TempSrc: Oral  PainSc:          Complications: No apparent anesthesia complications

## 2018-03-29 NOTE — ED Notes (Signed)
t continues to c/o  osterior chest and back ain  She still has a gallbladder.  Med given for nausea  And pain

## 2018-03-29 NOTE — ED Notes (Signed)
Unable to start antibiotics no blood cultures obtained yet  Pt is a difficult stick

## 2018-03-29 NOTE — Progress Notes (Signed)
CRITICAL VALUE STICKER  CRITICAL VALUE: lactic 3.8, 3.3  DATE & TIME NOTIFIED: 03/29/18 1400  MD NOTIFIED: Lorin Mercy  RESPONSE: None

## 2018-03-29 NOTE — ED Provider Notes (Signed)
[facility] EMERGENCY DEPARTMENT Provider Note  CSN: [id number] Arrival date & time: 03/29/18 0100  Chief Complaint(s) Abdominal Pain  HPI Raven Gray is a 84 y.o. female with extensive past medical history listed below including atrial fibrillation not currently on anticoagulation who presents to the emergency department with sudden onset periumbilical and upper abdominal discomfort with associated nausea and nonbloody nonbilious emesis that began approximately 3 hours prior to arrival.  No alleviating or aggravating factors.  No recent fevers or infections.  No chest pain or shortness of breath.  EMS was called out who noted patient was in atrial fibrillation.  During transportation the patient became diaphoretic and she was noted to have short runs of nonsustained V. tach.  Patient was given Zofran and amiodarone.  HPI  Past Medical History Past Medical History:  Diagnosis Date  . Atrial fibrillation ([place])    a. on Xarelto  . Chest pain   . COPD (chronic obstructive pulmonary disease) ([place])    family unsure if is copd,   . H/O echocardiogram    a. 08/2016: EF of 60-65%, severely dilated LA, mild pulmonic regurgitations, mild TR, and PA Pressure at 38 mm Hg  . History of depression   . History of DVT (deep vein thrombosis)   . History of pulmonary embolism   . Hyperlipidemia   . Hypertension   . Hypokalemia    Now improved with treatment  . Shortness of breath   . Stroke ([place])   . Tachy-brady syndrome ([place]) 04/01/2017   s/p MDT PPM    Patient Active Problem List   Diagnosis Date Noted  . Acute cholecystitis 03/29/2018  . Unilateral primary osteoarthritis, left knee   . Palliative care by specialist   . Epistaxis   . Syncope 11/02/2017  . Atrial fibrillation ([place]) 11/02/2017  . Nasal fracture 11/02/2017  . Fall at home 11/02/2017  . Hypertension 11/02/2017  . COPD with emphysema ([place]) 11/02/2017  . HLD (hyperlipidemia) 11/02/2017  . History of  pulmonary embolism 07/21/2017  . Nonsustained ventricular tachycardia ([place]) 07/21/2017  . Healthcare-associated pneumonia 07/01/2017  . Acute respiratory failure with hypoxia ([place]) 07/01/2017  . Acute diastolic heart failure ([place]) 07/01/2017  . Conjunctivitis 07/01/2017  . Sepsis ([place]) 07/01/2017  . HCAP (healthcare-associated pneumonia)   . Acute pulmonary edema ([facility])   . Tachycardia-bradycardia syndrome ([place]) 04/01/2017  . Pacemaker 04/01/2017  . Chronic atrial fibrillation ([place])   . Syncope and collapse   . Loss of consciousness ([place]) 01/08/2017  . Dehydration 11/25/2016  . Acute encephalopathy   . Encephalopathy 11/24/2016  . Dyspnea on exertion 10/29/2016  . Chronic respiratory failure with hypoxia ([place]) 10/29/2016  . Long term current use of anticoagulant 10/23/2016  . TIA (transient ischemic attack) 09/06/2016  . COPD (chronic obstructive pulmonary disease) ([place]) 09/06/2016  . Essential hypertension 09/06/2016  . GERD (gastroesophageal reflux disease) 09/06/2016  . Depression 09/06/2016  . Hyperlipidemia 10/23/2015  . Aortic insufficiency 12/15/2014  . Edema of both legs 09/19/2014  . Atrial fibrillation with slow ventricular response ([place]) 11/21/2013  . Chest pain 11/21/2013  . Community acquired pneumonia 06/19/2012  . Pulmonary embolism ([place]) 06/19/2012  . Hypokalemia 06/18/2012  . COPD with acute exacerbation ([place]) 06/18/2012  . Hypoxia 06/18/2012   Home Medication(s) Prior to Admission medications   Medication Sig Start Date End Date Taking? Authorizing Provider  acetaminophen (TYLENOL) 325 MG tablet Take 650 mg by mouth every 6 (six) hours as needed (for pain).    Yes [provider]  ALPRAZolam (XANAX) 0.25 MG tablet Take 0.25 mg by mouth 3 (three) times daily as needed for anxiety (anxiety).   Yes [name], MD  amoxicillin-clavulanate (AUGMENTIN) 875-125 MG tablet Take 1 tablet by mouth 2 (two) times daily.   Yes [provider]  furosemide  (LASIX) 40 MG tablet Take 1 tablet (40 mg total) by mouth daily. Take 1 tablet (40 mg) two times a day for 3 days. Then 1 tablet by mouth daily. Patient taking differently: Take 40 mg by mouth daily.  02/19/18  Yes Barrett, Evelene Croon, PA-C  MAGNESIUM PO Take 1 tablet by mouth daily.    Yes [provider]  MELATONIN PO Take 1 tablet by mouth at bedtime.    Yes [provider]  metoprolol tartrate (LOPRESSOR) 25 MG tablet Take 1 tablet (25 mg total) by mouth daily. 03/01/18  Yes Barrett, Evelene Croon, PA-C  montelukast (SINGULAIR) 10 MG tablet Take 10 mg by mouth at bedtime.   Yes [provider]  Multiple Vitamins-Minerals (WOMENS 50+ MULTI VITAMIN/MIN) TABS Take 1 tablet by mouth daily.   Yes [provider]  nitroGLYCERIN (NITROSTAT) 0.4 MG SL tablet Place 0.4 mg under the tongue every 5 (five) minutes as needed for chest pain. X 3 doses 02/08/16  Yes [provider]  omeprazole (PRILOSEC) 20 MG capsule Take 20 mg by mouth daily as needed (for reflux/heartburn).  04/23/15  Yes [provider]  OXYGEN Inhale 2 L into the lungs daily as needed (oxygen).    Yes [provider]  potassium chloride SA (K-DUR,KLOR-CON) 20 MEQ tablet Take 1 tablet (20 mEq total) by mouth daily as needed (along with lasix.). Patient taking differently: Take 20 mEq by mouth daily.  11/09/17  Yes Lavina Hamman, MD  pravastatin (PRAVACHOL) 40 MG tablet Take 40 mg by mouth every morning.    Yes [provider]  traZODone (DESYREL) 50 MG tablet Take 1 tablet by mouth at bedtime. 02/24/18  Yes [provider]  venlafaxine XR (EFFEXOR-XR) 150 MG 24 hr capsule Take 225 mg by mouth daily.    Yes [provider]  venlafaxine XR (EFFEXOR-XR) 75 MG 24 hr capsule Take 225 mg by mouth daily.  07/14/17  Yes [provider]                                                                                                                                      Past Surgical History Past Surgical History:  Procedure Laterality Date  . PACEMAKER IMPLANT N/A 04/01/2017   Procedure: Pacemaker Implant;  Surgeon: Sanda Klein, MD;  Location: [facility];  Service: Cardiovascular;  Laterality: N/A; MDT Azure XT SR MRI  . REPLACEMENT TOTAL KNEE    . ROTATOR CUFF REPAIR     Family History Family History  Problem Relation Age of Onset  . Alzheimer's disease Mother   .  Heart attack Father   . Diabetes type II Daughter   . Heart disease Daughter        stents    Social History Social History   Tobacco Use  . Smoking status: Never Smoker  . Smokeless tobacco: Never Used  Substance Use Topics  . Alcohol use: No  . Drug use: No   Allergies Patient has no known allergies.  Review of Systems Review of Systems All other systems are reviewed and are negative for acute change except as noted in the HPI  Physical Exam Vital Signs  I have reviewed the triage vital signs BP (!) 170/87   Pulse (!) 85   Resp 15   Ht 5\' 7"  (1.702 m)   Wt 71.2 kg (157 lb)   SpO2 98%   BMI 24.59 kg/m   Physical Exam  Constitutional: She is oriented to person, place, and time. She appears well-developed and well-nourished. No distress.  HENT:  Head: Normocephalic and atraumatic.  Right Ear: External ear normal.  Left Ear: External ear normal.  Nose: Nose normal.  Eyes: Pupils are equal, round, and reactive to light. Conjunctivae and EOM are normal. Right eye exhibits no discharge. Left eye exhibits no discharge. No scleral icterus.  Neck: Normal range of motion and phonation normal. Neck supple.  Cardiovascular: Normal rate and regular rhythm. Exam reveals no gallop and no friction rub.  No murmur heard. Pulmonary/Chest: Effort normal and breath sounds normal. No stridor. No respiratory distress. She has no rales.  Abdominal: Soft. She exhibits no distension. There is tenderness ( discomfort) in the epigastric area and periumbilical area. There is  no rigidity, no rebound and no guarding.  Musculoskeletal: Normal range of motion. She exhibits no edema or tenderness.  Neurological: She is alert and oriented to person, place, and time.  Skin: Skin is warm. No rash noted. She is diaphoretic. No erythema.  Psychiatric: She has a normal mood and affect. Her behavior is normal.  Vitals reviewed.   ED Results and Treatments Labs (all labs ordered are listed, but only abnormal results are displayed) Labs Reviewed  CBC - Abnormal; Notable for the following components:      Result Value   WBC 12.7 (*)    RBC 5.68 (*)    HCT 48.8 (*)    MCH 25.4 (*)    MCHC 29.5 (*)    RDW 17.5 (*)    All other components within normal limits  COMPREHENSIVE METABOLIC PANEL - Abnormal; Notable for the following components:   Glucose, Bld 174 (*)    GFR calc non Af Amer 49 (*)    GFR calc Af Amer 57 (*)    All other components within normal limits  BRAIN NATRIURETIC PEPTIDE - Abnormal; Notable for the following components:   B Natriuretic Peptide 811.5 (*)    All other components within normal limits  I-STAT CG4 LACTIC ACID, ED - Abnormal; Notable for the following components:   Lactic Acid, Venous 1.96 (*)    All other components within normal limits  I-STAT CHEM 8, ED - Abnormal; Notable for the following components:   BUN 25 (*)    Glucose, Bld 167 (*)    Calcium, Ion 1.05 (*)    Hemoglobin 16.7 (*)    HCT 49.0 (*)    All other components within normal limits  I-STAT CG4 LACTIC ACID, ED - Abnormal; Notable for the following components:   Lactic Acid, Venous 2.78 (*)    All other components  within normal limits  CULTURE, BLOOD (ROUTINE X 2)  CULTURE, BLOOD (ROUTINE X 2)  MAGNESIUM  URINALYSIS, ROUTINE W REFLEX MICROSCOPIC  I-STAT TROPONIN, ED                                                                                                                         EKG  EKG Interpretation  Date/Time:  Monday March 29 2018 01:08:44  EDT Ventricular Rate:  81 PR Interval:    QRS Duration: 178 QT Interval:  445 QTC Calculation: 517 R Axis:   98 Text Interpretation:  Atrial fibrillation Right bundle branch block Consider left ventricular hypertrophy Repol abnrm suggests ischemia, diffuse leads Prolonged QT interval Baseline wander in lead(s) II No significant change since last tracing Confirmed by Addison Lank [id number]) on 03/29/2018 1:20:11 AM      Radiology Ct Angio Abd/pel W And/or Wo Contrast  Result Date: 03/29/2018 CLINICAL DATA:  84 year old with abdominal pain and clinical concern for mesenteric ischemia. EXAM: CTA ABDOMEN AND PELVIS wITHOUT AND WITH CONTRAST TECHNIQUE: Multidetector CT imaging of the abdomen and pelvis was performed using the standard protocol during bolus administration of intravenous contrast. Multiplanar reconstructed images and MIPs were obtained and reviewed to evaluate the vascular anatomy. CONTRAST:  158mL ISOVUE-370 IOPAMIDOL (ISOVUE-370) INJECTION 76% COMPARISON:  None. FINDINGS: VASCULAR Aorta: Tortuous distal thoracic and abdominal aorta. No dissection. Ectatic infrarenal aorta maximal dimension 2.8 cm. Moderate calcified plaque throughout. 13 mm low-density structure medially to the right of the aorta at the diaphragmatic hiatus may be partially thrombosed aneurysm or lymphatic malformation, appears similar to recent lumbar spine CTs. Celiac: Mild plaque at the origin without significant stenosis. No dissection. SMA: Mild plaque at the origin without significant stenosis. No dissection. Distal branches are patent. Renals: Accessory left upper pole renal artery. No dissection or significant stenosis. IMA: Patent.  No aneurysm. Inflow: Tortuous without evidence of aneurysm or dissection. Calcified plaque without significant stenosis. Proximal Outflow: Bilateral common femoral and visualized portions of the superficial and profunda femoral arteries are patent without evidence of aneurysm, dissection,  vasculitis or significant stenosis. Veins: Portal and mesenteric veins are patent without filling defect. No obvious venous abnormality. Review of the MIP images confirms the above findings. NON-VASCULAR Lower chest: Multi chamber cardiomegaly with pacemaker partially included. There are coronary artery calcifications. Large hiatal hernia with greater than 50% of the stomach being intrathoracic. Hepatobiliary: Subcentimeter hypodensity in the left lobe of the liver is too small to characterize but likely cyst. Suspect small low-density lesions in the right hepatic dome partially obscured by adjacent streak artifact. Small subcapsular cyst in the right lobe. 2.6 cm gallstone in the gallbladder. Common bile duct is dilated measuring 11 mm distally, no visualized choledocholithiasis. Pancreas: Parenchymal atrophy. No ductal dilatation or inflammation. Spleen: Normal in size without focal abnormality. Adrenals/Urinary Tract: Mild adrenal thickening without dominant nodule. No hydronephrosis or perinephric edema. Multiple right renal cysts including parapelvic cyst in the lower right kidney. Urinary bladder is partially distended  without wall thickening. Stomach/Bowel: Large hiatal hernia with greater than 50% of the stomach being intrathoracic, both intrathoracic and intra-abdominal stomach is distended with ingested contents with air-fluid level. There is no small bowel dilatation, wall thickening or inflammatory change. No bowel obstruction. Mobile cecum located in the right central abdomen. Appendix not confidently visualized. Moderate colonic stool burden. Mild diverticulosis of the distal colon without diverticulitis. Lymphatic: No enlarged abdominal or pelvic lymph nodes. Reproductive: Uterus not well visualized, atrophic versus surgically absent. No adnexal mass. Other: No free air or ascites. Tiny fat containing umbilical hernia. Musculoskeletal: Scoliosis and multilevel degenerative change throughout spine.  Vertebral body hemangioma within T11. Surgical hardware posterior L4-L5. IMPRESSION: VASCULAR 1. No acute vascular abnormality. Patent mesenteric vessels without secondary findings of mesenteric ischemia. 2. Aortic atherosclerosis and tortuosity ( Aortic Atherosclerosis (ICD10-I70.0).) Ectatic abdominal aorta at risk for aneurysm development. Recommend followup by ultrasound in 5 years. This recommendation follows ACR consensus guidelines: White Paper of the ACR Incidental Findings Committee II on Vascular Findings. J Am Coll Radiol 2013; 10:789-794. 3. Multi chamber cardiomegaly. NON-VASCULAR 1. Gallstone in the gallbladder without CT findings of gallbladder inflammation. Mild biliary dilatation without visualized choledocholithiasis. 2. Large hiatal hernia, greater than 50% of the stomach is intrathoracic. Intrathoracic and intra-abdominal stomach are distended with ingested contents containing air-fluid level suggesting slow transit. 3. Colonic diverticulosis without diverticulitis. Electronically Signed   By: Jeb Levering M.D.   On: 03/29/2018 04:42   US Abdomen Limited Ruq  Result Date: 03/29/2018 CLINICAL DATA:  Initial evaluation for acute abdominal pain. EXAM: ULTRASOUND ABDOMEN LIMITED RIGHT UPPER QUADRANT COMPARISON:  Prior radiograph from 03/29/2018 FINDINGS: Gallbladder: 2.6 cm shadowing echogenic stone present within the gallbladder lumen. Gallbladder wall thickened up to 4 mm. No sonographic Murphy sign elicited on exam. No free pericholecystic fluid. Common bile duct: Diameter: 7.2 mm, likely within normal limits for age. Liver: No focal lesion identified. Within normal limits in parenchymal echogenicity. Portal vein is patent on color Doppler imaging with normal direction of blood flow towards the liver. IMPRESSION: 1. Cholelithiasis with mild gallbladder wall thickening. Clinical correlation for possible acute cholecystitis recommended. 2. No biliary dilatation. Electronically Signed   By:  Jeannine Boga M.D.   On: 03/29/2018 05:00   Pertinent labs & imaging results that were available during my care of the patient were reviewed by me and considered in my medical decision making (see chart for details).  Medications Ordered in ED Medications  piperacillin-tazobactam (ZOSYN) IVPB 3.375 g (has no administration in time range)  fentaNYL (SUBLIMAZE) injection 25-50 mcg (has no administration in time range)  famotidine (PEPCID) IVPB 20 mg premix (has no administration in time range)  fentaNYL (SUBLIMAZE) injection 50 mcg (50 mcg Intravenous Given 03/29/18 0208)  ondansetron (ZOFRAN) injection 4 mg (4 mg Intravenous Given 03/29/18 0206)  sodium chloride 0.9 % bolus 500 mL (0 mLs Intravenous Stopped 03/29/18 0441)  iopamidol (ISOVUE-370) 76 % injection 100 mL (100 mLs Intravenous Contrast Given 03/29/18 0336)  piperacillin-tazobactam (ZOSYN) IVPB 3.375 g (0 g Intravenous Stopped 03/29/18 0619)  ondansetron (ZOFRAN) injection 4 mg (4 mg Intravenous Given 03/29/18 0511)  fentaNYL (SUBLIMAZE) injection 25 mcg (25 mcg Intravenous Given 03/29/18 0529)  ALPRAZolam (XANAX) tablet 0.25 mg (0.25 mg Oral Given 03/29/18 9357)  Procedures Procedures  (including critical care time)  Medical Decision Making / ED Course I have reviewed the nursing notes for this encounter and the patient's prior records (if available in EHR or on provided paperwork).    Initially suspicion for possible mesenteric ischemia.  Also considered pancreatitis versus biliary disease.  Patient denies any chest pain.  EKG notable for atrial fibrillation with rate control.  No other ischemic changes.  Labs notable for leukocytosis with mild lactic acidosis at 1.96.  No evidence of biliary obstruction or pancreatitis.  No other significant electrolyte derangements.  Patient treated symptomatically with  IV pain medicine and antiemetics.  Also given IV fluids.  CT angiogram negative for any arterial occlusion.  It did reveal cholelithiasis without evidence of acute cholecystitis.  Given her symptomatology and leukocytosis, right upper quadrant ultrasound was obtained which did reveal wall thickening concerning for cholecystitis.  Repeat lactic acid was trending up.  Code sepsis was initiated and patient was started on empiric antibiotics.  Case discussed with Dr. Ninfa Linden from general surgery who will consult on the patient.  Medicine consulted for admission and continued management.  Final Clinical Impression(s) / ED Diagnoses Final diagnoses:  Abdominal pain  Acute cholecystitis  Permanent atrial fibrillation ([place])      This chart was dictated using voice recognition software.  Despite best efforts to proofread,  errors can occur which can change the documentation meaning.   Fatima Blank, MD 03/29/18 [phone]

## 2018-03-29 NOTE — Consult Note (Addendum)
[facility] Surgery Consult Note  Raven Gray 1928/03/16  [id number].    Requesting MD: Addison Lank Chief Complaint/Reason for Consult: gallstones  HPI:  Raven Gray is an 84 yo female who presented to the ED early this morning with acute onset abdominal pain. States that the pain started last night after dinner. Initially it was central and radiated into her back. Pain gradually worsened and is now global about her abdominal. Associated with n/v. No fever, chills, diarrhea. Tried Tums and Pepto Bismal but this did not help. States that she has never had pain like this before. Patient's family called EMS who brought her to the ED. During transportation the patient became diaphoretic and she was noted to have short runs of nonsustained V. tach.  Patient was given Zofran and amiodarone.  Workup thus far has revealed lactic acidosis 2.78, WBC 12.7, BNP 811. LFTs WNL. Blood and urine cultures pending. CTA shows no vascular abnormality; it does show a 2.6cm gallstone without signs of cholecystitis as well as large hiatal hernia. Abdominal u/s showed cholelithiasis with mild gallbladder wall thickening.  She has had nothing to eat or drink today.  PMH significant for Afib, tachybrady syndrome s/p PM placement, h/o CVA, h/o DVT/PE, HTN, HLD, COPD on home O2 PRN, chronic diastolic CHF Abdominal surgical history: hysterectomy Anticoagulants: none Nonsmoker Lives at home with one of her daughters  ROS: Review of Systems  Constitutional: Positive for malaise/fatigue.  HENT: Negative.   Eyes: Negative.   Respiratory: Negative.   Cardiovascular: Negative.   Gastrointestinal: Positive for abdominal pain, nausea and vomiting.  Genitourinary: Negative.   Musculoskeletal: Positive for back pain.  Skin: Negative.   Neurological: Negative.     All systems reviewed and otherwise negative except for as above  Family History  Problem Relation Age of Onset  . Alzheimer's disease Mother   .  Heart attack Father   . Diabetes type II Daughter   . Heart disease Daughter        stents    Past Medical History:  Diagnosis Date  . Atrial fibrillation ([place])    off AC due to falls  . Chest pain   . COPD (chronic obstructive pulmonary disease) (HCC)    wears prn home O2  . H/O echocardiogram    a. 08/2016: EF of 60-65%, severely dilated LA, mild pulmonic regurgitations, mild TR, and PA Pressure at 38 mm Hg  . History of depression   . History of pulmonary embolism   . Hyperlipidemia   . Hypertension   . Hypokalemia    Now improved with treatment  . Shortness of breath   . 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 Tachy-brady syndrome ([place]) 04/01/2017   s/p MDT PPM     Past Surgical History:  Procedure Laterality Date  . PACEMAKER IMPLANT N/A 04/01/2017   Procedure: Pacemaker Implant;  Surgeon: Sanda Klein, MD;  Location: [place] [facility];  Service: Cardiovascular;  Laterality: N/A; MDT Azure XT SR MRI  . REPLACEMENT TOTAL KNEE    . ROTATOR CUFF REPAIR      Social History:  reports that she has never smoked. She has never used smokeless tobacco. She reports that she does not drink alcohol or use drugs.  Allergies: No Known Allergies   (Not in a hospital admission)  Prior to Admission medications   Medication Sig Start Date End Date Taking? Authorizing Provider  acetaminophen (TYLENOL) 325 MG tablet Take 650 mg by mouth every  6 (six) hours as needed (for pain).    Yes [provider]  ALPRAZolam (XANAX) 0.25 MG tablet Take 0.25 mg by mouth 3 (three) times daily as needed for anxiety (anxiety).   Yes [name], MD  amoxicillin-clavulanate (AUGMENTIN) 875-125 MG tablet Take 1 tablet by mouth 2 (two) times daily.   Yes [provider]  furosemide (LASIX) 40 MG tablet Take 1 tablet (40 mg total) by mouth daily. Take 1 tablet (40 mg) two times a day for 3 days. Then 1 tablet by mouth daily. Patient taking differently: Take 40 mg by mouth  daily.  02/19/18  Yes Barrett, Evelene Croon, PA-C  MAGNESIUM PO Take 1 tablet by mouth daily.    Yes [provider]  MELATONIN PO Take 1 tablet by mouth at bedtime.    Yes [provider]  metoprolol tartrate (LOPRESSOR) 25 MG tablet Take 1 tablet (25 mg total) by mouth daily. 03/01/18  Yes Barrett, Evelene Croon, PA-C  montelukast (SINGULAIR) 10 MG tablet Take 10 mg by mouth at bedtime.   Yes [provider]  Multiple Vitamins-Minerals (WOMENS 50+ MULTI VITAMIN/MIN) TABS Take 1 tablet by mouth daily.   Yes [provider]  nitroGLYCERIN (NITROSTAT) 0.4 MG SL tablet Place 0.4 mg under the tongue every 5 (five) minutes as needed for chest pain. X 3 doses 02/08/16  Yes [provider]  omeprazole (PRILOSEC) 20 MG capsule Take 20 mg by mouth daily as needed (for reflux/heartburn).  04/23/15  Yes [provider]  OXYGEN Inhale 2 L into the lungs daily as needed (oxygen).    Yes [provider]  potassium chloride SA (K-DUR,KLOR-CON) 20 MEQ tablet Take 1 tablet (20 mEq total) by mouth daily as needed (along with lasix.). Patient taking differently: Take 20 mEq by mouth daily.  11/09/17  Yes Lavina Hamman, MD  pravastatin (PRAVACHOL) 40 MG tablet Take 40 mg by mouth every morning.    Yes [provider]  traZODone (DESYREL) 50 MG tablet Take 1 tablet by mouth at bedtime. 02/24/18  Yes [provider]  venlafaxine XR (EFFEXOR-XR) 150 MG 24 hr capsule Take 225 mg by mouth daily.    Yes [provider]  venlafaxine XR (EFFEXOR-XR) 75 MG 24 hr capsule Take 225 mg by mouth daily.  07/14/17  Yes [provider]    Blood pressure 138/89, pulse 79, resp. rate 15, height _0  (1.702 m), weight 157 lb (71.2 kg), SpO2 98 %. Physical Exam: General: pleasant, WD/WN white female who is laying in bed in NAD but appears very uncomfortable HEENT: head is normocephalic, atraumatic.  Sclera are noninjected.  Pupils equal and round.  Ears  and nose without any masses or lesions.  Mouth is pink and moist. Dentition fair Heart: regular, rate, and rhythm.  No obvious murmurs, gallops, or rubs noted.  Palpable pedal pulses bilaterally Lungs: CTAB, no wheezes, rhonchi, or rales noted.  Respiratory effort nonlabored Abd: soft, ND, +BS, no masses or organomegaly, TTP in epigastric region and RUQ without rebound or guarding MS: all 4 extremities are symmetrical with no cyanosis, clubbing, or edema. Skin: warm and dry with no masses, lesions, or rashes Psych: appropriate affect. Neuro: cranial nerves grossly intact, extremity CSM intact bilaterally, normal speech  Results for orders placed or performed during the hospital encounter of 03/29/18 (from the past 48 hour(s))  CBC     Status: Abnormal   Collection Time: 03/29/18  1:31 AM  Result Value Ref Range  WBC 12.7 (H) 4.0 - 10.5 K/uL   RBC 5.68 (H) 3.87 - 5.11 MIL/uL   Hemoglobin 14.4 12.0 - 15.0 g/dL   HCT 48.8 (H) 36.0 - 46.0 %   MCV 85.9 78.0 - 100.0 fL   MCH 25.4 (L) 26.0 - 34.0 pg   MCHC 29.5 (L) 30.0 - 36.0 g/dL   RDW 17.5 (H) 11.5 - 15.5 %   Platelets 215 150 - 400 K/uL    Comment: Performed at [facility] [address] [place], [place] [address]  Comprehensive metabolic panel     Status: Abnormal   Collection Time: 03/29/18  1:31 AM  Result Value Ref Range   Sodium 142 135 - 145 mmol/L   Potassium 4.0 3.5 - 5.1 mmol/L   Chloride 102 98 - 111 mmol/L    Comment: Please note change in reference range.   CO2 30 22 - 32 mmol/L   Glucose, Bld 174 (H) 70 - 99 mg/dL    Comment: Please note change in reference range.   BUN 22 8 - 23 mg/dL    Comment: Please note change in reference range.   Creatinine, Ser 1.00 0.44 - 1.00 mg/dL   Calcium 9.2 8.9 - 10.3 mg/dL   Total Protein 6.7 6.5 - 8.1 g/dL   Albumin 3.5 3.5 - 5.0 g/dL   AST 22 15 - 41 U/L   ALT 21 0 - 44 U/L    Comment: Please note change in reference range.   Alkaline Phosphatase 86 38 - 126 U/L    Total Bilirubin 0.8 0.3 - 1.2 mg/dL   GFR calc non Af Amer 49 (L) >60 mL/min   GFR calc Af Amer 57 (L) >60 mL/min    Comment: (NOTE) The eGFR has been calculated using the CKD EPI equation. This calculation has not been validated in all clinical situations. eGFR's persistently <60 mL/min signify possible Chronic Kidney Disease.    Anion gap 10 5 - 15    Comment: Performed at [facility] [address], [place], [place] [address]  Brain natriuretic peptide     Status: Abnormal   Collection Time: 03/29/18  1:31 AM  Result Value Ref Range   B Natriuretic Peptide 811.5 (H) 0.0 - 100.0 pg/mL    Comment: Performed at [facility] [address] [place], [place] [address]  Magnesium     Status: None   Collection Time: 03/29/18  1:31 AM  Result Value Ref Range   Magnesium 2.0 1.7 - 2.4 mg/dL    Comment: Performed at [facility] [address], [place],  [address]  I-stat troponin, ED     Status: None   Collection Time: 03/29/18  1:41 AM  Result Value Ref Range   Troponin i, poc 0.01 0.00 - 0.08 ng/mL   Comment 3            Comment: Due to the release kinetics of cTnI, a negative result within the first hours of the onset of symptoms does not rule out myocardial infarction with certainty. If myocardial infarction is still suspected, repeat the test at appropriate intervals.   I-Stat CG4 Lactic Acid, ED     Status: Abnormal   Collection Time: 03/29/18  1:44 AM  Result Value Ref Range   Lactic Acid, Venous 1.96 (H) 0.5 - 1.9 mmol/L  I-Stat Chem 8, ED     Status: Abnormal   Collection Time: 03/29/18  1:44 AM  Result Value  Ref Range   Sodium 139 135 - 145 mmol/L   Potassium 3.8 3.5 - 5.1 mmol/L   Chloride 99 98 - 111 mmol/L   BUN 25 (H) 8 - 23 mg/dL   Creatinine, Ser 0.90 0.44 - 1.00 mg/dL   Glucose, Bld 167 (H) 70 - 99 mg/dL   Calcium, Ion 1.05 (L) 1.15 - 1.40 mmol/L   TCO2 31 22 - 32 mmol/L   Hemoglobin 16.7 (H) 12.0 - 15.0 g/dL   HCT 49.0 (H) 36.0  - 46.0 %  I-Stat CG4 Lactic Acid, ED     Status: Abnormal   Collection Time: 03/29/18  4:42 AM  Result Value Ref Range   Lactic Acid, Venous 2.78 (HH) 0.5 - 1.9 mmol/L   Comment NOTIFIED PHYSICIAN   Urinalysis, Routine w reflex microscopic     Status: Abnormal   Collection Time: 03/29/18  4:51 AM  Result Value Ref Range   Color, Urine YELLOW YELLOW   APPearance CLOUDY (A) CLEAR   Specific Gravity, Urine >1.046 (H) 1.005 - 1.030   pH 6.0 5.0 - 8.0   Glucose, UA NEGATIVE NEGATIVE mg/dL   Hgb urine dipstick LARGE (A) NEGATIVE   Bilirubin Urine NEGATIVE NEGATIVE   Ketones, ur NEGATIVE NEGATIVE mg/dL   Protein, ur 30 (A) NEGATIVE mg/dL   Nitrite NEGATIVE NEGATIVE   Leukocytes, UA MODERATE (A) NEGATIVE   RBC / HPF >50 (H) 0 - 5 RBC/hpf   WBC, UA >50 (H) 0 - 5 WBC/hpf   Bacteria, UA RARE (A) NONE SEEN   Squamous Epithelial / LPF >50 (H) 0 - 5   Non Squamous Epithelial 6-10 (A) NONE SEEN    Comment: Performed at [facility], [place] [address] [place], [place] [address]   Ct Angio Abd/pel W And/or Wo Contrast  Result Date: 03/29/2018 CLINICAL DATA:  84 year old with abdominal pain and clinical concern for mesenteric ischemia. EXAM: CTA ABDOMEN AND PELVIS wITHOUT AND WITH CONTRAST TECHNIQUE: Multidetector CT imaging of the abdomen and pelvis was performed using the standard protocol during bolus administration of intravenous contrast. Multiplanar reconstructed images and MIPs were obtained and reviewed to evaluate the vascular anatomy. CONTRAST:  167m ISOVUE-370 IOPAMIDOL (ISOVUE-370) INJECTION 76% COMPARISON:  None. FINDINGS: VASCULAR Aorta: Tortuous distal thoracic and abdominal aorta. No dissection. Ectatic infrarenal aorta maximal dimension 2.8 cm. Moderate calcified plaque throughout. 13 mm low-density structure medially to the right of the aorta at the diaphragmatic hiatus may be partially thrombosed aneurysm or lymphatic malformation, appears similar to recent lumbar spine CTs. Celiac:  Mild plaque at the origin without significant stenosis. No dissection. SMA: Mild plaque at the origin without significant stenosis. No dissection. Distal branches are patent. Renals: Accessory left upper pole renal artery. No dissection or significant stenosis. IMA: Patent.  No aneurysm. Inflow: Tortuous without evidence of aneurysm or dissection. Calcified plaque without significant stenosis. Proximal Outflow: Bilateral common femoral and visualized portions of the superficial and profunda femoral arteries are patent without evidence of aneurysm, dissection, vasculitis or significant stenosis. Veins: Portal and mesenteric veins are patent without filling defect. No obvious venous abnormality. Review of the MIP images confirms the above findings. NON-VASCULAR Lower chest: Multi chamber cardiomegaly with pacemaker partially included. There are coronary artery calcifications. Large hiatal hernia with greater than 50% of the stomach being intrathoracic. Hepatobiliary: Subcentimeter hypodensity in the left lobe of the liver is too small to characterize but likely cyst. Suspect small low-density lesions in the right hepatic dome partially obscured by adjacent streak artifact.  Small subcapsular cyst in the right lobe. 2.6 cm gallstone in the gallbladder. Common bile duct is dilated measuring 11 mm distally, no visualized choledocholithiasis. Pancreas: Parenchymal atrophy. No ductal dilatation or inflammation. Spleen: Normal in size without focal abnormality. Adrenals/Urinary Tract: Mild adrenal thickening without dominant nodule. No hydronephrosis or perinephric edema. Multiple right renal cysts including parapelvic cyst in the lower right kidney. Urinary bladder is partially distended without wall thickening. Stomach/Bowel: Large hiatal hernia with greater than 50% of the stomach being intrathoracic, both intrathoracic and intra-abdominal stomach is distended with ingested contents with air-fluid level. There is no small  bowel dilatation, wall thickening or inflammatory change. No bowel obstruction. Mobile cecum located in the right central abdomen. Appendix not confidently visualized. Moderate colonic stool burden. Mild diverticulosis of the distal colon without diverticulitis. Lymphatic: No enlarged abdominal or pelvic lymph nodes. Reproductive: Uterus not well visualized, atrophic versus surgically absent. No adnexal mass. Other: No free air or ascites. Tiny fat containing umbilical hernia. Musculoskeletal: Scoliosis and multilevel degenerative change throughout spine. Vertebral body hemangioma within T11. Surgical hardware posterior L4-L5. IMPRESSION: VASCULAR 1. No acute vascular abnormality. Patent mesenteric vessels without secondary findings of mesenteric ischemia. 2. Aortic atherosclerosis and tortuosity ( Aortic Atherosclerosis (ICD10-I70.0).) Ectatic abdominal aorta at risk for aneurysm development. Recommend followup by ultrasound in 5 years. This recommendation follows ACR consensus guidelines: White Paper of the ACR Incidental Findings Committee II on Vascular Findings. J Am Coll Radiol 2013; 10:789-794. 3. Multi chamber cardiomegaly. NON-VASCULAR 1. Gallstone in the gallbladder without CT findings of gallbladder inflammation. Mild biliary dilatation without visualized choledocholithiasis. 2. Large hiatal hernia, greater than 50% of the stomach is intrathoracic. Intrathoracic and intra-abdominal stomach are distended with ingested contents containing air-fluid level suggesting slow transit. 3. Colonic diverticulosis without diverticulitis. Electronically Signed   By: Jeb Levering M.D.   On: 03/29/2018 04:42   US Abdomen Limited Ruq  Result Date: 03/29/2018 CLINICAL DATA:  Initial evaluation for acute abdominal pain. EXAM: ULTRASOUND ABDOMEN LIMITED RIGHT UPPER QUADRANT COMPARISON:  Prior radiograph from 03/29/2018 FINDINGS: Gallbladder: 2.6 cm shadowing echogenic stone present within the gallbladder lumen.  Gallbladder wall thickened up to 4 mm. No sonographic Murphy sign elicited on exam. No free pericholecystic fluid. Common bile duct: Diameter: 7.2 mm, likely within normal limits for age. Liver: No focal lesion identified. Within normal limits in parenchymal echogenicity. Portal vein is patent on color Doppler imaging with normal direction of blood flow towards the liver. IMPRESSION: 1. Cholelithiasis with mild gallbladder wall thickening. Clinical correlation for possible acute cholecystitis recommended. 2. No biliary dilatation. Electronically Signed   By: Jeannine Boga M.D.   On: 03/29/2018 05:00      Assessment/Plan Afib Tachybrady syndrome s/p PM placement  - according to her familty, she has not had a stroke.  He last head CT scan did not show evidence of a stroke. H/o DVT/PE - off anticoagulation since 10/2017 due to age and frequent falls Chronic diastolic CHF - ECHO 6/50/3546 EF 50-55% HTN HLD COPD on home O2 PRN Large hiatal hernia Code status DNR  Abdominal pain, n/v Cholelithiasis with possible cholecystitis - Acute onset abdominal pain with n/v, cholelithiasis and possible cholecystitis. Intractable n/v in ED despite antiemetics.   She does have a large hiatal hernia which can also cause n/v, but I believe her clinic picture is more consistent with cholecystitis. Will order HIDA scan.  Patient with multiple medical problems therefore she will be admitted to the medicine service.   Continue IV zosyn and  NPO. Will discuss further with MD.  ID - zosyn 7/8>> FEN - IVF, NPO Foley - none  Wellington Hampshire, [facility] 03/29/2018, 7:52 AM Pager: [phone] Consults: [phone]  Agree with above. The patient is sedated and can not carry on a conversation.  Three daughters at [employer] (who has POA and whom she lives with). [place] [place], and [employer].  According to the daughters, she has not had a stroke.  But has become  progressively immobile.  The most likely cause of her acute illness/abdominal and back pain is cholecystitis.  We discussed other potential causes of abdominal pain.  I think that the HIDA scan will add little.  We discussed her HH.  But I think that she is best served by going for cholecystectomy.  [name] had a open cholecystectomy about 30 years ago.  I discussed with the patient's daughters the indications and risks of gall bladder surgery.  The primary risks of gall bladder surgery include, but are not limited to, bleeding, infection, common bile duct injury, and open surgery.  There is also the risk that the patient may have continued symptoms after surgery.  We discussed the typical post-operative recovery course. I tried to answer the patient's questions.  Will plan for surgery later today.  Alphonsa Overall, MD, [facility] Surgery Pager: [phone] Office phone:  [phone]

## 2018-03-29 NOTE — H&P (Signed)
History and Physical    Raven Gray ZDG:[id number] DOB: Sep 04, 1928 DOA: 03/29/2018  PCP: Janie Morning, DO Consultants:  Gwenlyn Found - cardiology Patient coming from:  Home - lives with family; NOK: Daughter, [phone]  Chief Complaint: abdominal pain  HPI: Raven Gray is a 84 y.o. female with medical history significant of afib not on AC; tachybradycardia syndrome status post pacemaker, CVA, hypertension, hyperlipidemia, history of VT,and COPD on prn home O2 presenting with abdominal pain.  Symptoms started last night about 9pm.  She thought she ate too much.  She felt like she was refluxing.  Family gave her Tums, Pepto.  Nothing helped.  About midnight, they called 911.  Pain started in her LUQ but now it extends all the way across her upper abdomen and into her back.    ED Course:  Abdominal pain, n/v.  Afib with runs of NSVT, given Amio.  +Leukocytosis, lactic acidosis.  Cholecystitis with sepsis - given Zosyn.  Dr. Ninfa Linden consulting from surgery.  Review of Systems: Unable to perform due to patient's acute illness  Ambulatory Status:  Ambulates with a walker or uses a wheelchair  Past Medical History:  Diagnosis Date  . Atrial fibrillation ([place])    off AC due to falls  . Chest pain   . COPD (chronic obstructive pulmonary disease) (HCC)    wears prn home O2  . H/O echocardiogram    a. 08/2016: EF of 60-65%, severely dilated LA, mild pulmonic regurgitations, mild TR, and PA Pressure at 38 mm Hg  . History of depression   . History of pulmonary embolism   . Hyperlipidemia   . Hypertension   . Hypokalemia    Now improved with treatment  . Shortness of breath   . Stroke [facility])    brainstem aneurysm(?) in 2016  . Tachy-brady syndrome ([place]) 04/01/2017   s/p MDT PPM     Past Surgical History:  Procedure Laterality Date  . PACEMAKER IMPLANT N/A 04/01/2017   Procedure: Pacemaker Implant;  Surgeon: Sanda Klein, MD;  Location: [place] [facility];  Service: Cardiovascular;   Laterality: N/A; MDT Azure XT SR MRI  . REPLACEMENT TOTAL KNEE    . ROTATOR CUFF REPAIR      Social History   Socioeconomic History  . Marital status: Widowed    Spouse name: Not on file  . Number of children: Not on file  . Years of education: Not on file  . Highest education level: Not on file  Occupational History  . Not on file  Social Needs  . Financial resource strain: Not on file  . Food insecurity:    Worry: Not on file    Inability: Not on file  . Transportation needs:    Medical: Not on file    Non-medical: Not on file  Tobacco Use  . Smoking status: Never Smoker  . Smokeless tobacco: Never Used  Substance and Sexual Activity  . Alcohol use: No  . Drug use: No  . Sexual activity: Never  Lifestyle  . Physical activity:    Days per week: Not on file    Minutes per session: Not on file  . Stress: Not on file  Relationships  . Social connections:    Talks on phone: Not on file    Gets together: Not on file    Attends religious service: Not on file    Active member of club or organization: Not on file    Attends meetings of clubs or organizations: Not  on file    Relationship status: Not on file  . Intimate partner violence:    Fear of current or ex partner: Not on file    Emotionally abused: Not on file    Physically abused: Not on file    Forced sexual activity: Not on file  Other Topics Concern  . Not on file  Social History Narrative  . Not on file    No Known Allergies  Family History  Problem Relation Age of Onset  . Alzheimer's disease Mother   . Heart attack Father   . Diabetes type II Daughter   . Heart disease Daughter        stents    Prior to Admission medications   Medication Sig Start Date End Date Taking? Authorizing Provider  acetaminophen (TYLENOL) 325 MG tablet Take 650 mg by mouth every 6 (six) hours as needed (for pain).    Yes [provider]  ALPRAZolam (XANAX) 0.25 MG tablet Take 0.25 mg by mouth 3 (three) times  daily as needed for anxiety (anxiety).   Yes [name], MD  amoxicillin-clavulanate (AUGMENTIN) 875-125 MG tablet Take 1 tablet by mouth 2 (two) times daily.   Yes [provider]  furosemide (LASIX) 40 MG tablet Take 1 tablet (40 mg total) by mouth daily. Take 1 tablet (40 mg) two times a day for 3 days. Then 1 tablet by mouth daily. Patient taking differently: Take 40 mg by mouth daily.  02/19/18  Yes Barrett, Evelene Croon, PA-C  MAGNESIUM PO Take 1 tablet by mouth daily.    Yes [provider]  MELATONIN PO Take 1 tablet by mouth at bedtime.    Yes [provider]  metoprolol tartrate (LOPRESSOR) 25 MG tablet Take 1 tablet (25 mg total) by mouth daily. 03/01/18  Yes Barrett, Evelene Croon, PA-C  montelukast (SINGULAIR) 10 MG tablet Take 10 mg by mouth at bedtime.   Yes [provider]  Multiple Vitamins-Minerals (WOMENS 50+ MULTI VITAMIN/MIN) TABS Take 1 tablet by mouth daily.   Yes [provider]  nitroGLYCERIN (NITROSTAT) 0.4 MG SL tablet Place 0.4 mg under the tongue every 5 (five) minutes as needed for chest pain. X 3 doses 02/08/16  Yes [provider]  omeprazole (PRILOSEC) 20 MG capsule Take 20 mg by mouth daily as needed (for reflux/heartburn).  04/23/15  Yes [provider]  OXYGEN Inhale 2 L into the lungs daily as needed (oxygen).    Yes [provider]  potassium chloride SA (K-DUR,KLOR-CON) 20 MEQ tablet Take 1 tablet (20 mEq total) by mouth daily as needed (along with lasix.). Patient taking differently: Take 20 mEq by mouth daily.  11/09/17  Yes Lavina Hamman, MD  pravastatin (PRAVACHOL) 40 MG tablet Take 40 mg by mouth every morning.    Yes [provider]  traZODone (DESYREL) 50 MG tablet Take 1 tablet by mouth at bedtime. 02/24/18  Yes [provider]  venlafaxine XR (EFFEXOR-XR) 150 MG 24 hr capsule Take 225 mg by mouth daily.    Yes [provider]  venlafaxine XR (EFFEXOR-XR) 75 MG 24 hr  capsule Take 225 mg by mouth daily.  07/14/17  Yes [provider]    Physical Exam: Vitals:   03/29/18 0630 03/29/18 0813 03/29/18 0836 03/29/18 0916  BP: 138/89 (!) 181/73 138/72 (!) 165/103  Pulse: 79 85 70 86  Resp: 15 17 17 17   Temp:    98.2 F (36.8 C)  TempSrc:  Oral  SpO2: 98% 94% 98% 99%  Weight:      Height:         General: Appears extremely uncomfortable - she is retching and moaning in pain despite pain meds and antiemetics Eyes:  PERRL, EOMI, normal lids, iris ENT:  grossly normal hearing, lips & tongue, mmm Neck:  no LAD, masses or thyromegaly Cardiovascular:  RRR, no m/r/g. No LE edema.  Respiratory:   CTA bilaterally with no wheezes/rales/rhonchi.  Normal respiratory effort. Abdomen: Diffusely TTP but particularly in the L>R UQ, decreased BS Skin:  no rash or induration seen on limited exam Musculoskeletal:  grossly normal tone BUE/BLE, good ROM, no bony abnormality Lower extremity:  No LE edema.  Limited foot exam with no ulcerations.  2+ distal pulses. Psychiatric:  Clearly uncomfortable, somewhat agitated; answers some questions but mostly appears uncomfortable Neurologic: unable to assess    Radiological Exams on Admission: Ct Angio Abd/pel W And/or Wo Contrast  Result Date: 03/29/2018 CLINICAL DATA:  84 year old with abdominal pain and clinical concern for mesenteric ischemia. EXAM: CTA ABDOMEN AND PELVIS wITHOUT AND WITH CONTRAST TECHNIQUE: Multidetector CT imaging of the abdomen and pelvis was performed using the standard protocol during bolus administration of intravenous contrast. Multiplanar reconstructed images and MIPs were obtained and reviewed to evaluate the vascular anatomy. CONTRAST:  148mL ISOVUE-370 IOPAMIDOL (ISOVUE-370) INJECTION 76% COMPARISON:  None. FINDINGS: VASCULAR Aorta: Tortuous distal thoracic and abdominal aorta. No dissection. Ectatic infrarenal aorta maximal dimension 2.8 cm. Moderate calcified plaque throughout. 13 mm  low-density structure medially to the right of the aorta at the diaphragmatic hiatus may be partially thrombosed aneurysm or lymphatic malformation, appears similar to recent lumbar spine CTs. Celiac: Mild plaque at the origin without significant stenosis. No dissection. SMA: Mild plaque at the origin without significant stenosis. No dissection. Distal branches are patent. Renals: Accessory left upper pole renal artery. No dissection or significant stenosis. IMA: Patent.  No aneurysm. Inflow: Tortuous without evidence of aneurysm or dissection. Calcified plaque without significant stenosis. Proximal Outflow: Bilateral common femoral and visualized portions of the superficial and profunda femoral arteries are patent without evidence of aneurysm, dissection, vasculitis or significant stenosis. Veins: Portal and mesenteric veins are patent without filling defect. No obvious venous abnormality. Review of the MIP images confirms the above findings. NON-VASCULAR Lower chest: Multi chamber cardiomegaly with pacemaker partially included. There are coronary artery calcifications. Large hiatal hernia with greater than 50% of the stomach being intrathoracic. Hepatobiliary: Subcentimeter hypodensity in the left lobe of the liver is too small to characterize but likely cyst. Suspect small low-density lesions in the right hepatic dome partially obscured by adjacent streak artifact. Small subcapsular cyst in the right lobe. 2.6 cm gallstone in the gallbladder. Common bile duct is dilated measuring 11 mm distally, no visualized choledocholithiasis. Pancreas: Parenchymal atrophy. No ductal dilatation or inflammation. Spleen: Normal in size without focal abnormality. Adrenals/Urinary Tract: Mild adrenal thickening without dominant nodule. No hydronephrosis or perinephric edema. Multiple right renal cysts including parapelvic cyst in the lower right kidney. Urinary bladder is partially distended without wall thickening. Stomach/Bowel:  Large hiatal hernia with greater than 50% of the stomach being intrathoracic, both intrathoracic and intra-abdominal stomach is distended with ingested contents with air-fluid level. There is no small bowel dilatation, wall thickening or inflammatory change. No bowel obstruction. Mobile cecum located in the right central abdomen. Appendix not confidently visualized. Moderate colonic stool burden. Mild diverticulosis of the distal colon without diverticulitis. Lymphatic: No enlarged abdominal or pelvic lymph nodes.  Reproductive: Uterus not well visualized, atrophic versus surgically absent. No adnexal mass. Other: No free air or ascites. Tiny fat containing umbilical hernia. Musculoskeletal: Scoliosis and multilevel degenerative change throughout spine. Vertebral body hemangioma within T11. Surgical hardware posterior L4-L5. IMPRESSION: VASCULAR 1. No acute vascular abnormality. Patent mesenteric vessels without secondary findings of mesenteric ischemia. 2. Aortic atherosclerosis and tortuosity ( Aortic Atherosclerosis (ICD10-I70.0).) Ectatic abdominal aorta at risk for aneurysm development. Recommend followup by ultrasound in 5 years. This recommendation follows ACR consensus guidelines: White Paper of the ACR Incidental Findings Committee II on Vascular Findings. J Am Coll Radiol 2013; 10:789-794. 3. Multi chamber cardiomegaly. NON-VASCULAR 1. Gallstone in the gallbladder without CT findings of gallbladder inflammation. Mild biliary dilatation without visualized choledocholithiasis. 2. Large hiatal hernia, greater than 50% of the stomach is intrathoracic. Intrathoracic and intra-abdominal stomach are distended with ingested contents containing air-fluid level suggesting slow transit. 3. Colonic diverticulosis without diverticulitis. Electronically Signed   By: Jeb Levering M.D.   On: 03/29/2018 04:42   US Abdomen Limited Ruq  Result Date: 03/29/2018 CLINICAL DATA:  Initial evaluation for acute abdominal  pain. EXAM: ULTRASOUND ABDOMEN LIMITED RIGHT UPPER QUADRANT COMPARISON:  Prior radiograph from 03/29/2018 FINDINGS: Gallbladder: 2.6 cm shadowing echogenic stone present within the gallbladder lumen. Gallbladder wall thickened up to 4 mm. No sonographic Murphy sign elicited on exam. No free pericholecystic fluid. Common bile duct: Diameter: 7.2 mm, likely within normal limits for age. Liver: No focal lesion identified. Within normal limits in parenchymal echogenicity. Portal vein is patent on color Doppler imaging with normal direction of blood flow towards the liver. IMPRESSION: 1. Cholelithiasis with mild gallbladder wall thickening. Clinical correlation for possible acute cholecystitis recommended. 2. No biliary dilatation. Electronically Signed   By: Jeannine Boga M.D.   On: 03/29/2018 05:00    EKG: Independently reviewed.  NSR with rate 81; RBBB; nonspecific ST changes with concern for ischemia vs/ repolarization abnormality diffusely   Labs on Admission: I have personally reviewed the available labs and imaging studies at the time of the admission.  Pertinent labs:   UA: large Hgb, moderate LE, rare bacteria, >50 RBC and WBC Lactate 1.96, 2.78 Troponin 0.01 Glucose 174 BNP 811.5 WBC 12.7 Blood cultures pending  Assessment/Plan Principal Problem:   Acute cholecystitis Active Problems:   COPD (chronic obstructive pulmonary disease) (HCC)   Essential hypertension   Chronic atrial fibrillation (HCC)   Sepsis (HCC)   HLD (hyperlipidemia)   Acute cholecystitis with sepsis -SIRS criteria in this patient includes: Leukocytosis, tachycardia, tachypnea  -Patient has evidence of acute organ failure with elevated lactate  -While awaiting blood cultures, this appears to be a preseptic condition. -Sepsis protocol initiated -Her source appears to be related to acute onset overnight of abdominal pain -Imaging indicates cholelithiasis with mild gallbladder wall thickening and possible  acute cholecystitis -HIDA scan has been ordered by surgery for confirmation -Patient is NPO -Fentanyl for pain, Zofran/Phenergan for nausea -Given her level of discomfort despite pain and nausea medication, she would benefit from ASAP cholecystectomy (see clearance below) -Surgery is consulting for probable cholecystectomy -Alternatively, a percutatnous cholecystostomy could be considered - but this is not a definitive treatment for the issue -Empiric coverage with Zosyn for now -Blood and urine cultures pending -Will admit to SDU and continue to monitor -Will trend lactate to ensure improvement  Preoperative clearance -Intraperitoneal surgery is associated with an intermediate (1-5%) cardiovascular risk for cardiac death and nonfatal MI -She does not have any major or intermediate clinical predictors  of increased perioperative cardiovascular risk -Her revised cardiac index gives a risk estimate of 10.1% risk of death, MI, or cardiac arrest. -Because of this risk, and in light of her elevated BNP, she is recommended to have pre-operative EKG testing prior to surgery and troponins should be measured daily for 48-72 hours post-operatively. -Her Detsky's Modified Cardiac Risk Index score is Class II, with a 7% risk of complications -Given her moderate functional capacity, it is reasonable for her to go to the OR without additional evaluation -Her family understands that there are risks associated with any surgery, but in this situation given her severe discomfort, definitive treatment appears to outweigh the surgical risks.  HTN -She is taking Lopressor once daily; this medication is effective BID and so likely needs to be changed at the time of d/c -In the meantime, ordered standing IV Lopressor as well as prn IV hydralazine -Poor control while in the ER, but this was likely exacerbated by pain/emesis  HLD -Continue Pravachol  Afib -She is rate controlled - although given her level of  discomfort, there is a risk that she will develop RVR -She is no longer on AC due to epistaxis and recurrent falls  COPD -She wears prn [place] O2 -Interestingly, she does not appear to be on medications other than Singulair -Will add prn Duonebs   DVT prophylaxis: SCDs pending possible surgery Code Status: DNR - confirmed with family Family Communication: Daughters present throughout evaluation Disposition Plan:  Home once clinically improved Consults called: Surgery  Admission status: Admit - It is my clinical opinion that admission to [facility] is reasonable and necessary because of the expectation that this patient will require hospital care that crosses at least 2 midnights to treat this condition based on the medical complexity of the problems presented.  Given the aforementioned information, the predictability of an adverse outcome is felt to be significant.    Karmen Bongo MD [facility]  If note is complete, please contact covering daytime or nighttime physician. [email or web address] Password [facility]  03/29/2018, 9:49 AM

## 2018-03-30 ENCOUNTER — Ambulatory Visit: Payer: Medicare Other | Admitting: Physician Assistant

## 2018-03-30 ENCOUNTER — Encounter (HOSPITAL_COMMUNITY): Payer: Self-pay | Admitting: Surgery

## 2018-03-30 LAB — COMPREHENSIVE METABOLIC PANEL
ALBUMIN: 3 g/dL — AB (ref 3.5–5.0)
ALT: 24 U/L (ref 0–44)
ANION GAP: 9 (ref 5–15)
AST: 29 U/L (ref 15–41)
Alkaline Phosphatase: 73 U/L (ref 38–126)
BUN: 19 mg/dL (ref 8–23)
CHLORIDE: 103 mmol/L (ref 98–111)
CO2: 30 mmol/L (ref 22–32)
Calcium: 8.9 mg/dL (ref 8.9–10.3)
Creatinine, Ser: 1.06 mg/dL — ABNORMAL HIGH (ref 0.44–1.00)
GFR calc Af Amer: 53 mL/min — ABNORMAL LOW (ref >60)
GFR calc non Af Amer: 45 mL/min — ABNORMAL LOW (ref >60)
GLUCOSE: 139 mg/dL — AB (ref 70–99)
POTASSIUM: 4.5 mmol/L (ref 3.5–5.1)
SODIUM: 142 mmol/L (ref 135–145)
Total Bilirubin: 1 mg/dL (ref 0.3–1.2)
Total Protein: 5.8 g/dL — ABNORMAL LOW (ref 6.5–8.1)

## 2018-03-30 LAB — CBC
HCT: 46.2 % — ABNORMAL HIGH (ref 36.0–46.0)
Hemoglobin: 13.5 g/dL (ref 12.0–15.0)
MCH: 25.1 pg — ABNORMAL LOW (ref 26.0–34.0)
MCHC: 29.2 g/dL — AB (ref 30.0–36.0)
MCV: 86 fL (ref 78.0–100.0)
PLATELETS: 173 10*3/uL (ref 150–400)
RBC: 5.37 MIL/uL — ABNORMAL HIGH (ref 3.87–5.11)
RDW: 17.8 % — AB (ref 11.5–15.5)
WBC: 14.7 10*3/uL — AB (ref 4.0–10.5)

## 2018-03-30 MED ORDER — ACETAMINOPHEN 325 MG PO TABS
650.0000 mg | ORAL_TABLET | Freq: Three times a day (TID) | ORAL | Status: DC
  Administered 2018-03-30 – 2018-04-01 (×5): 650 mg via ORAL
  Filled 2018-03-30 (×5): qty 2

## 2018-03-30 MED ORDER — SODIUM CHLORIDE 0.9 % IV SOLN: INTRAVENOUS | Status: DC

## 2018-03-30 MED ORDER — METOPROLOL TARTRATE 25 MG PO TABS
25.0000 mg | ORAL_TABLET | Freq: Every day | ORAL | Status: DC
  Administered 2018-03-30 – 2018-04-01 (×3): 25 mg via ORAL
  Filled 2018-03-30 (×3): qty 1

## 2018-03-30 MED ORDER — POTASSIUM CHLORIDE CRYS ER 20 MEQ PO TBCR: 20.0000 meq | EXTENDED_RELEASE_TABLET | Freq: Every day | ORAL | Status: DC | PRN

## 2018-03-30 MED ORDER — SODIUM CHLORIDE 0.9 % IV SOLN
INTRAVENOUS | Status: DC
  Administered 2018-03-30 – 2018-03-31 (×3): 250 mL via INTRAVENOUS
  Administered 2018-04-01: 100 mL via INTRAVENOUS

## 2018-03-30 MED ORDER — ACETAMINOPHEN 650 MG RE SUPP: 650.0000 mg | Freq: Three times a day (TID) | RECTAL | Status: DC

## 2018-03-30 MED ORDER — ORAL CARE MOUTH RINSE
15.0000 mL | Freq: Two times a day (BID) | OROMUCOSAL | Status: DC
  Administered 2018-03-31 (×2): 15 mL via OROMUCOSAL

## 2018-03-30 NOTE — Evaluation (Signed)
Physical Therapy Evaluation Patient Details Name: Raven Gray MRN: [id number] DOB: Feb 19, 1928 Today's Date: 03/30/2018   History of Present Illness  Raven Gray is a 84 y.o. female with medical history significant of afib not on AC; tachybradycardia syndrome status post pacemaker, CVA, hypertension, hyperlipidemia, history of VT,and COPD on prn home O2 presenting with abdominal pain. Cholecystitis with sepsis, now s/p cholecystectomy  Clinical Impression   Patient is s/p above surgery resulting in functional limitations due to the deficits listed below (see PT Problem List). Household ambulator prior to admission; presents with decr  functional mobility , decr activity tolerance; Family interested in short-term rehab at SNF level to maximize independence and safety with mobility prior to dc home, and this is not unreasonable;  Patient will benefit from skilled PT to increase their independence and safety with mobility to allow discharge to the venue listed below.       Follow Up Recommendations SNF    Equipment Recommendations  Rolling walker with 5" wheels;Other (comment)(will consider Rollator rW)    Recommendations for Other Services       Precautions / Restrictions Precautions Precautions: Fall      Mobility  Bed Mobility Overal bed mobility: Needs Assistance Bed Mobility: Supine to Sit;Sit to Supine     Supine to sit: Min guard Sit to supine: Min guard   General bed mobility comments: minguard for safety and lines  Transfers Overall transfer level: Needs assistance Equipment used: 2 person hand held assist Transfers: Sit to/from Stand Sit to Stand: +2 safety/equipment;Min assist         General transfer comment: Min assist to power up and steady  Ambulation/Gait Ambulation/Gait assistance: Min assist;+2 safety/equipment Gait Distance (Feet): (sidesteps up to [facility]) Assistive device: 2 person hand held assist Gait Pattern/deviations: Shuffle     General Gait  Details: Cues to self-monitor for activity tolernace  Stairs            Wheelchair Mobility    Modified Rankin (Stroke Patients Only)       Balance Overall balance assessment: Needs assistance Sitting-balance support: Feet supported Sitting balance-Leahy Scale: Fair       Standing balance-Leahy Scale: Poor                               Pertinent Vitals/Pain Pain Assessment: Faces Faces Pain Scale: Hurts little more Pain Location: abdomen Pain Descriptors / Indicators: Aching;Grimacing;Guarding Pain Intervention(s): Monitored during session    Home Living Family/patient expects to be discharged to:: Private residence Living Arrangements: Children Available Help at Discharge: Family;Available 24 hours/day Type of Home: House Home Access: Ramped entrance;Stairs to enter Entrance Stairs-Rails: None Entrance Stairs-Number of Steps: 2 Home Layout: One level Home Equipment: Clinical cytogeneticist - 2 wheels;Cane - single point;Bedside commode;Wheelchair - manual Additional Comments: Uses supplemental O2 at home as needed    Prior Function Level of Independence: Independent with assistive device(s)         Comments: ambulates with either SPC or RW     Hand Dominance   Dominant Hand: Right    Extremity/Trunk Assessment   Upper Extremity Assessment Upper Extremity Assessment: Overall WFL for tasks assessed    Lower Extremity Assessment Lower Extremity Assessment: Generalized weakness       Communication   Communication: No difficulties  Cognition Arousal/Alertness: Awake/alert Behavior During Therapy: WFL for tasks assessed/performed   Area of Impairment: Attention;Memory;Orientation  Orientation Level: Disoriented to;Place;Time;Situation Current Attention Level: Sustained Memory: Decreased recall of precautions;Decreased short-term memory                General Comments General comments (skin integrity,  edema, etc.): VSS on supplemental O2    Exercises     Assessment/Plan    PT Assessment Patient needs continued PT services  PT Problem List Decreased strength;Decreased range of motion;Decreased activity tolerance;Decreased balance;Decreased mobility;Decreased coordination;Decreased cognition;Decreased knowledge of use of DME;Decreased safety awareness;Decreased knowledge of precautions       PT Treatment Interventions DME instruction;Gait training;Functional mobility training;Therapeutic activities;Therapeutic exercise;Balance training;Neuromuscular re-education;Cognitive remediation;Patient/family education    PT Goals (Current goals can be found in the Care Plan section)  Acute Rehab PT Goals Patient Stated Goal: agreeable to getitng up a little PT Goal Formulation: With patient/family Time For Goal Achievement: 04/13/18 Potential to Achieve Goals: Good    Frequency Min 3X/week   Barriers to discharge        Co-evaluation               AM-PAC PT "6 Clicks" Daily Activity  Outcome Measure Difficulty turning over in bed (including adjusting bedclothes, sheets and blankets)?: A Little Difficulty moving from lying on back to sitting on the side of the bed? : A Little Difficulty sitting down on and standing up from a chair with arms (e.g., wheelchair, bedside commode, etc,.)?: Unable Help needed moving to and from a bed to chair (including a wheelchair)?: A Lot Help needed walking in hospital room?: A Lot Help needed climbing 3-5 steps with a railing? : A Lot 6 Click Score: 13    End of Session Equipment Utilized During Treatment: Gait belt Activity Tolerance: Patient tolerated treatment well Patient left: in bed;with call bell/phone within reach;with bed alarm set;with family/visitor present   PT Visit Diagnosis: Unsteadiness on feet (R26.81);Other abnormalities of gait and mobility (R26.89);Muscle weakness (generalized) (M62.81)    Time: 1444-1500 PT Time  Calculation (min) (ACUTE ONLY): 16 min   Charges:   PT Evaluation $PT Eval Moderate Complexity: 1 Mod     PT G Codes:        Roney Marion, PT  Acute Rehabilitation Services Pager [phone] Office [phone]   [place] 03/30/2018, 4:03 PM

## 2018-03-30 NOTE — Anesthesia Postprocedure Evaluation (Signed)
Anesthesia Post Note  Patient: Raven Gray  Procedure(s) Performed: LAPAROSCOPIC CHOLECYSTECTOMY WITH INTRAOPERATIVE CHOLANGIOGRAM (N/A Abdomen)     Patient location during evaluation: PACU Anesthesia Type: General Level of consciousness: awake and alert Pain management: pain level controlled Vital Signs Assessment: post-procedure vital signs reviewed and stable Respiratory status: spontaneous breathing, nonlabored ventilation and respiratory function stable Cardiovascular status: blood pressure returned to baseline and stable Postop Assessment: no apparent nausea or vomiting Anesthetic complications: no    Last Vitals:  Vitals:   03/30/18 1059 03/30/18 1226  BP: 129/64 (!) 152/117  Pulse: 66 73  Resp: 20 16  Temp:    SpO2: 97% 98%    Last Pain:  Vitals:   03/30/18 1425  TempSrc:   PainSc: 10-Worst pain ever                 Catalina Gravel

## 2018-03-30 NOTE — Clinical Social Work Note (Signed)
Clinical Social Work Assessment  Patient Details  Name: Raven Gray MRN: [id number] Date of Birth: [birth year]  Date of referral:  03/30/18               Reason for consult:  Facility Placement                Permission sought to share information with:  Facility Sport and exercise psychologist, Family Supports Permission granted to share information::  Yes, Verbal Permission Granted  Name::     Conservation officer, historic buildings::  SNFs  Relationship::  Daughter  Contact Information:  [phone]  Housing/Transportation Living arrangements for the past 2 months:  [place] of Information:  Patient, Adult Children Patient Interpreter Needed:  None Criminal Activity/Legal Involvement Pertinent to Current Situation/Hospitalization:  No - Comment as needed Significant Relationships:  Adult Children Lives with:  Self, Adult Children Do you feel safe going back to the place where you live?  No Need for family participation in patient care:  Yes (Comment)  Care giving concerns:  CSW received consult for possible SNF placement at time of discharge. CSW spoke with patient and patient's daughter, Raven Gray, regarding PT recommendation of SNF placement at time of discharge. Patient's daughter reported that patient could do with some rehab while her incisions are healing due to her age. Patient expressed understanding of PT recommendation and is agreeable to SNF placement at time of discharge. CSW to continue to follow and assist with discharge planning needs.   Social Worker assessment / plan:  CSW spoke with patient's daughter concerning possibility of rehab at [facility] before returning home.  Employment status:  Retired Nurse, adult PT Recommendations:  [place] / Referral to community resources:  [place]  Patient/Family's Response to care:  Patient and daughter recognize need for rehab before returning home and is agreeable to a SNF in  [place]. Patient has been to [place] earlier this year but does not want to return there. CSW emailed patient's daughter the SNF list. Patient's pasrr is also under manual review.  Patient/Family's Understanding of and Emotional Response to Diagnosis, Current Treatment, and Prognosis:  Patient/family is realistic regarding therapy needs and expressed being hopeful for SNF placement. Patient expressed understanding of CSW role and discharge process as well as medical condition. No questions/concerns about plan or treatment.    Emotional Assessment Appearance:  Appears stated age Attitude/Demeanor/Rapport:  Gracious Affect (typically observed):  Accepting, Appropriate Orientation:  Oriented to Self, Oriented to Place, Oriented to  Time, Oriented to Situation Alcohol / Substance use:  Not Applicable Psych involvement (Current and /or in the community):  No (Comment)  Discharge Needs  Concerns to be addressed:  Care Coordination Readmission within the last 30 days:  No Current discharge risk:  None Barriers to Discharge:  Continued Medical Work up   [employer], Learned 03/30/2018, 4:52 PM

## 2018-03-30 NOTE — Discharge Instructions (Signed)

## 2018-03-30 NOTE — NC FL2 (Signed)
[place] LEVEL OF CARE SCREENING TOOL     IDENTIFICATION  Patient Name: Raven Gray Birthdate: July 02, 1928 Sex: female Admission Date (Current Location): 03/29/2018  [facility] and [place] Number:  Herbalist and Address:  [facility] [place] [place] [address], [place], [place] [address]      Provider Number: [id number]  Attending Physician Name and Address:  Georgette Shell, MD  Relative Name and Phone Number:  [name], daughter, [phone]    Current Level of Care: Hospital Recommended Level of Care: [place] Prior Approval Number:    Date Approved/Denied:   PASRR Number:    Discharge Plan: SNF    Current Diagnoses: Patient Active Problem List   Diagnosis Date Noted  . Acute cholecystitis 03/29/2018  . Unilateral primary osteoarthritis, left knee   . Palliative care by specialist   . Epistaxis   . Syncope 11/02/2017  . Nasal fracture 11/02/2017  . Fall at home 11/02/2017  . COPD with emphysema ([place]) 11/02/2017  . HLD (hyperlipidemia) 11/02/2017  . History of pulmonary embolism 07/21/2017  . Nonsustained ventricular tachycardia ([place]) 07/21/2017  . Healthcare-associated pneumonia 07/01/2017  . Acute respiratory failure with hypoxia ([place]) 07/01/2017  . Acute diastolic heart failure ([place]) 07/01/2017  . Conjunctivitis 07/01/2017  . Sepsis ([place]) 07/01/2017  . HCAP (healthcare-associated pneumonia)   . Acute pulmonary edema (HCC)   . Tachycardia-bradycardia syndrome ([place]) 04/01/2017  . Pacemaker 04/01/2017  . Chronic atrial fibrillation ([place])   . Syncope and collapse   . Loss of consciousness ([place]) 01/08/2017  . Dehydration 11/25/2016  . Acute encephalopathy   . Encephalopathy 11/24/2016  . Dyspnea on exertion 10/29/2016  . Chronic respiratory failure with hypoxia ([place]) 10/29/2016  . TIA (transient ischemic attack) 09/06/2016  . COPD (chronic obstructive pulmonary disease) ([place]) 09/06/2016  . Essential  hypertension 09/06/2016  . GERD (gastroesophageal reflux disease) 09/06/2016  . Depression 09/06/2016  . Aortic insufficiency 12/15/2014  . Edema of both legs 09/19/2014  . Chest pain 11/21/2013  . Community acquired pneumonia 06/19/2012  . Pulmonary embolism ([place]) 06/19/2012  . Hypokalemia 06/18/2012  . COPD with acute exacerbation ([place]) 06/18/2012  . Hypoxia 06/18/2012    Orientation RESPIRATION BLADDER Height & Weight     Self, Time, Situation, Place  Normal Continent, External catheter Weight: 71.2 kg (157 lb) Height:  5\' 7"  (170.2 cm)  BEHAVIORAL SYMPTOMS/MOOD NEUROLOGICAL BOWEL NUTRITION STATUS      Continent Diet(Please see DC Summary)  AMBULATORY STATUS COMMUNICATION OF NEEDS Skin   Limited Assist Verbally Surgical wounds(Closed incision on abdomen)                       Personal Care Assistance Level of Assistance  Bathing, Feeding, Dressing Bathing Assistance: Limited assistance Feeding assistance: Limited assistance Dressing Assistance: Limited assistance     Functional Limitations Info  Sight, Hearing, Speech Sight Info: Adequate Hearing Info: Adequate Speech Info: Adequate    SPECIAL CARE FACTORS FREQUENCY  PT (By licensed PT), OT (By licensed OT)     PT Frequency: 5x/week OT Frequency: 3x/week            Contractures      Additional Factors Info  Code Status, Allergies, Psychotropic Code Status Info: DNR Allergies Info: NKA Psychotropic Info: Effexor; Trazadone         Current Medications (03/30/2018):  This is the current hospital active medication list Current Facility-Administered Medications  Medication Dose Route Frequency Provider Last Rate  Last Dose  . acetaminophen (TYLENOL) tablet 650 mg  650 mg Oral Q6H PRN Alphonsa Overall, MD   650 mg at 03/29/18 2124   Or  . acetaminophen (TYLENOL) suppository 650 mg  650 mg Rectal Q6H PRN Alphonsa Overall, MD      . famotidine (PEPCID) IVPB 20 mg premix  20 mg Intravenous Q12H Alphonsa Overall, MD    Stopped at 03/30/18 1126  . fentaNYL (SUBLIMAZE) injection 12.5-25 mcg  12.5-25 mcg Intravenous Q2H PRN Alphonsa Overall, MD      . hydrALAZINE (APRESOLINE) injection 5 mg  5 mg Intravenous Q4H PRN Alphonsa Overall, MD   5 mg at 03/29/18 0800  . HYDROcodone-acetaminophen (NORCO/VICODIN) 5-325 MG per tablet 1-2 tablet  1-2 tablet Oral Q6H PRN Alphonsa Overall, MD   1 tablet at 03/30/18 1424  . ipratropium-albuterol (DUONEB) 0.5-2.5 (3) MG/3ML nebulizer solution 3 mL  3 mL Nebulization Q6H PRN Alphonsa Overall, MD      . lactated ringers infusion   Intravenous Continuous Alphonsa Overall, MD   Stopped at 03/30/18 1428  . LORazepam (ATIVAN) injection 0.5 mg  0.5 mg Intravenous Q6H PRN Alphonsa Overall, MD      . metoprolol tartrate (LOPRESSOR) tablet 25 mg  25 mg Oral Daily Georgette Shell, MD   25 mg at 03/30/18 1227  . montelukast (SINGULAIR) tablet 10 mg  10 mg Oral QHS Alphonsa Overall, MD   10 mg at 03/29/18 2124  . ondansetron (ZOFRAN) injection 4 mg  4 mg Intravenous Q6H PRN Alphonsa Overall, MD      . piperacillin-tazobactam (ZOSYN) IVPB 3.375 g  3.375 g Intravenous Tenna Child, MD 12.5 mL/hr at 03/30/18 1542    . potassium chloride SA (K-DUR,KLOR-CON) CR tablet 20 mEq  20 mEq Oral Daily PRN Georgette Shell, MD      . pravastatin (PRAVACHOL) tablet 40 mg  40 mg Oral q morning - 10a Alphonsa Overall, MD   40 mg at 03/30/18 6389  . promethazine (PHENERGAN) injection 12.5-25 mg  12.5-25 mg Intravenous Q6H PRN Alphonsa Overall, MD   12.5 mg at 03/29/18 0755  . traZODone (DESYREL) tablet 50 mg  50 mg Oral QHS Alphonsa Overall, MD   50 mg at 03/29/18 2124  . venlafaxine XR (EFFEXOR-XR) 24 hr capsule 225 mg  225 mg Oral Daily Alphonsa Overall, MD   225 mg at 03/30/18 3734     Discharge Medications: Please see discharge summary for a list of discharge medications.  Relevant Imaging Results:  Relevant Lab Results:   Additional Information SS#: [id number]  Benard Halsted, LCSWA

## 2018-03-30 NOTE — Progress Notes (Signed)
PROGRESS NOTE    Raven Gray  WFU:[id number] DOB: 05-10-1928 DOA: 03/29/2018 PCP: Janie Morning, DO   Brief Narrative:84 y.o. female with medical history significant of afib not on AC; tachybradycardia syndrome status post pacemaker, CVA, hypertension, hyperlipidemia, history of VT,and COPD on prn home O2 presenting with abdominal pain.  Symptoms started last night about 9pm.  She thought she ate too much.  She felt like she was refluxing.  Family gave her Tums, Pepto.  Nothing helped.  About midnight, they called 911.  Pain started in her LUQ but now it extends all the way across her upper abdomen and into her back.    ED Course:  Abdominal pain, n/v.  Afib with runs of NSVT, given Amio.  +Leukocytosis, lactic acidosis.  Cholecystitis with sepsis - given Zosyn.  Dr. Ninfa Linden consulting from surgery.    Assessment & Plan:   Principal Problem:   Acute cholecystitis Active Problems:   COPD (chronic obstructive pulmonary disease) (HCC)   Essential hypertension   Chronic atrial fibrillation (HCC)   Sepsis (HCC)   HLD (hyperlipidemia)  1] status post laparoscopic cholecystectomy secondary to acute cholecystitis patient doing well other than complaints of some incisional pain.  Had one episode of vomiting.  Advance diet per surgery.  PT OT evaluations pending.  2] chronic atrial fibrillation not on anticoagulation due to falls.  3] hypertension restart Lopressor.  5] COPD stable patient has oxygen at home on a as needed basis.   DVT prophylaxis: SCD  Code Status: dnr Family Communication: No family available Disposition Plan:.  Plan discharge 24 to 48 hours pending PT evaluation. Consultants:  General surgery  Procedures:lap Cholecystectomy 03/29/2018. Antimicrobials: Zosyn Subjective: Complains of mild incisional pain and one episode of vomiting otherwise no new complaints.   Objective: Very sweet young lady in no acute distress. Vitals:   03/29/18 2240 03/30/18 0246 03/30/18  0642 03/30/18 1059  BP: (!) 167/88 (!) 161/99 (!) 164/78 129/64  Pulse: 79 66 72 66  Resp: 19 16 15 20   Temp: 98.2 F (36.8 C)  (!) 97.2 F (36.2 C)   TempSrc: Axillary  Oral   SpO2: 97% 97% 97% 97%  Weight:      Height:        Intake/Output Summary (Last 24 hours) at 03/30/2018 1154 Last data filed at 03/30/2018 1100 Gross per 24 hour  Intake 2547.27 ml  Output 850 ml  Net 1697.27 ml   Filed Weights   03/29/18 0110 03/29/18 1111  Weight: 71.2 kg (157 lb) 71.2 kg (157 lb)    Examination:  General exam: Appears calm and comfortable  Respiratory system: Clear to auscultation. Respiratory effort normal. Cardiovascular system: S1 & S2 heard, RRR. No JVD, murmurs, rubs, gallops or clicks. No pedal edema. Gastrointestinal system: Abdomen is nondistended, soft and mild tender along the incision.  No organomegaly or masses felt. Normal bowel sounds heard. Central nervous system: Alert and oriented. No focal neurological deficits. Extremities: Symmetric 5 x 5 power. Skin: No rashes, lesions or ulcers Psychiatry: Judgement and insight appear normal. Mood & affect appropriate.     Data Reviewed: I have personally reviewed following labs and imaging studies  CBC: Recent Labs  Lab 03/29/18 0131 03/29/18 0144 03/30/18 0509  WBC 12.7*  --  14.7*  HGB 14.4 16.7* 13.5  HCT 48.8* 49.0* 46.2*  MCV 85.9  --  86.0  PLT 215  --  202   Basic Metabolic Panel: Recent Labs  Lab 03/29/18 0131 03/29/18 0144 03/30/18  0509  NA 142 139 142  K 4.0 3.8 4.5  CL 102 99 103  CO2 30  --  30  GLUCOSE 174* 167* 139*  BUN 22 25* 19  CREATININE 1.00 0.90 1.06*  CALCIUM 9.2  --  8.9  MG 2.0  --   --    GFR: Estimated Creatinine Clearance: 35.7 mL/min (A) (by C-G formula based on SCr of 1.06 mg/dL (H)). Liver Function Tests: Recent Labs  Lab 03/29/18 0131 03/30/18 0509  AST 22 29  ALT 21 24  ALKPHOS 86 73  BILITOT 0.8 1.0  PROT 6.7 5.8*  ALBUMIN 3.5 3.0*   No results for input(s):  LIPASE, AMYLASE in the last 168 hours. No results for input(s): AMMONIA in the last 168 hours. Coagulation Profile: No results for input(s): INR, PROTIME in the last 168 hours. Cardiac Enzymes: No results for input(s): CKTOTAL, CKMB, CKMBINDEX, TROPONINI in the last 168 hours. BNP (last 3 results) No results for input(s): PROBNP in the last 8760 hours. HbA1C: No results for input(s): HGBA1C in the last 72 hours. CBG: No results for input(s): GLUCAP in the last 168 hours. Lipid Profile: No results for input(s): CHOL, HDL, LDLCALC, TRIG, CHOLHDL, LDLDIRECT in the last 72 hours. Thyroid Function Tests: No results for input(s): TSH, T4TOTAL, FREET4, T3FREE, THYROIDAB in the last 72 hours. Anemia Panel: No results for input(s): VITAMINB12, FOLATE, FERRITIN, TIBC, IRON, RETICCTPCT in the last 72 hours. Sepsis Labs: Recent Labs  Lab 03/29/18 0144 03/29/18 0442 03/29/18 0922 03/29/18 1155  PROCALCITON  --   --  <0.10  --   LATICACIDVEN 1.96* 2.78* 3.8* 3.3*    Recent Results (from the past 240 hour(s))  Surgical pcr screen     Status: None   Collection Time: 03/29/18  1:22 PM  Result Value Ref Range Status   MRSA, PCR NEGATIVE NEGATIVE Final   Staphylococcus aureus NEGATIVE NEGATIVE Final    Comment: (NOTE) The Xpert SA Assay (FDA approved for NASAL specimens in patients 8 years of age and older), is one component of a comprehensive surveillance program. It is not intended to diagnose infection nor to guide or monitor treatment. Performed at [facility], [place] [address] [place], [place] [address]          Radiology Studies: Dg Cholangiogram Operative  Result Date: 03/30/2018 CLINICAL DATA:  Gallstones EXAM: INTRAOPERATIVE CHOLANGIOGRAM TECHNIQUE: Cholangiographic images from the C-arm fluoroscopic device were submitted for interpretation post-operatively. Please see the procedural report for the amount of contrast and the fluoroscopy time utilized. COMPARISON:   None. FINDINGS: Contrast fills the cystic duct and intrahepatic biliary tree. The common bile duct and duodenum fail to opacify. IMPRESSION: Findings are consistent with common bile duct obstruction. Electronically Signed   By: Marybelle Killings M.D.   On: 03/30/2018 07:34   Ct Angio Abd/pel W And/or Wo Contrast  Result Date: 03/29/2018 CLINICAL DATA:  84 year old with abdominal pain and clinical concern for mesenteric ischemia. EXAM: CTA ABDOMEN AND PELVIS wITHOUT AND WITH CONTRAST TECHNIQUE: Multidetector CT imaging of the abdomen and pelvis was performed using the standard protocol during bolus administration of intravenous contrast. Multiplanar reconstructed images and MIPs were obtained and reviewed to evaluate the vascular anatomy. CONTRAST:  172mL ISOVUE-370 IOPAMIDOL (ISOVUE-370) INJECTION 76% COMPARISON:  None. FINDINGS: VASCULAR Aorta: Tortuous distal thoracic and abdominal aorta. No dissection. Ectatic infrarenal aorta maximal dimension 2.8 cm. Moderate calcified plaque throughout. 13 mm low-density structure medially to the right of the aorta at the diaphragmatic hiatus  may be partially thrombosed aneurysm or lymphatic malformation, appears similar to recent lumbar spine CTs. Celiac: Mild plaque at the origin without significant stenosis. No dissection. SMA: Mild plaque at the origin without significant stenosis. No dissection. Distal branches are patent. Renals: Accessory left upper pole renal artery. No dissection or significant stenosis. IMA: Patent.  No aneurysm. Inflow: Tortuous without evidence of aneurysm or dissection. Calcified plaque without significant stenosis. Proximal Outflow: Bilateral common femoral and visualized portions of the superficial and profunda femoral arteries are patent without evidence of aneurysm, dissection, vasculitis or significant stenosis. Veins: Portal and mesenteric veins are patent without filling defect. No obvious venous abnormality. Review of the MIP images confirms  the above findings. NON-VASCULAR Lower chest: Multi chamber cardiomegaly with pacemaker partially included. There are coronary artery calcifications. Large hiatal hernia with greater than 50% of the stomach being intrathoracic. Hepatobiliary: Subcentimeter hypodensity in the left lobe of the liver is too small to characterize but likely cyst. Suspect small low-density lesions in the right hepatic dome partially obscured by adjacent streak artifact. Small subcapsular cyst in the right lobe. 2.6 cm gallstone in the gallbladder. Common bile duct is dilated measuring 11 mm distally, no visualized choledocholithiasis. Pancreas: Parenchymal atrophy. No ductal dilatation or inflammation. Spleen: Normal in size without focal abnormality. Adrenals/Urinary Tract: Mild adrenal thickening without dominant nodule. No hydronephrosis or perinephric edema. Multiple right renal cysts including parapelvic cyst in the lower right kidney. Urinary bladder is partially distended without wall thickening. Stomach/Bowel: Large hiatal hernia with greater than 50% of the stomach being intrathoracic, both intrathoracic and intra-abdominal stomach is distended with ingested contents with air-fluid level. There is no small bowel dilatation, wall thickening or inflammatory change. No bowel obstruction. Mobile cecum located in the right central abdomen. Appendix not confidently visualized. Moderate colonic stool burden. Mild diverticulosis of the distal colon without diverticulitis. Lymphatic: No enlarged abdominal or pelvic lymph nodes. Reproductive: Uterus not well visualized, atrophic versus surgically absent. No adnexal mass. Other: No free air or ascites. Tiny fat containing umbilical hernia. Musculoskeletal: Scoliosis and multilevel degenerative change throughout spine. Vertebral body hemangioma within T11. Surgical hardware posterior L4-L5. IMPRESSION: VASCULAR 1. No acute vascular abnormality. Patent mesenteric vessels without secondary  findings of mesenteric ischemia. 2. Aortic atherosclerosis and tortuosity ( Aortic Atherosclerosis (ICD10-I70.0).) Ectatic abdominal aorta at risk for aneurysm development. Recommend followup by ultrasound in 5 years. This recommendation follows ACR consensus guidelines: White Paper of the ACR Incidental Findings Committee II on Vascular Findings. J Am Coll Radiol 2013; 10:789-794. 3. Multi chamber cardiomegaly. NON-VASCULAR 1. Gallstone in the gallbladder without CT findings of gallbladder inflammation. Mild biliary dilatation without visualized choledocholithiasis. 2. Large hiatal hernia, greater than 50% of the stomach is intrathoracic. Intrathoracic and intra-abdominal stomach are distended with ingested contents containing air-fluid level suggesting slow transit. 3. Colonic diverticulosis without diverticulitis. Electronically Signed   By: Jeb Levering M.D.   On: 03/29/2018 04:42   US Abdomen Limited Ruq  Result Date: 03/29/2018 CLINICAL DATA:  Initial evaluation for acute abdominal pain. EXAM: ULTRASOUND ABDOMEN LIMITED RIGHT UPPER QUADRANT COMPARISON:  Prior radiograph from 03/29/2018 FINDINGS: Gallbladder: 2.6 cm shadowing echogenic stone present within the gallbladder lumen. Gallbladder wall thickened up to 4 mm. No sonographic Murphy sign elicited on exam. No free pericholecystic fluid. Common bile duct: Diameter: 7.2 mm, likely within normal limits for age. Liver: No focal lesion identified. Within normal limits in parenchymal echogenicity. Portal vein is patent on color Doppler imaging with normal direction of blood flow towards the  liver. IMPRESSION: 1. Cholelithiasis with mild gallbladder wall thickening. Clinical correlation for possible acute cholecystitis recommended. 2. No biliary dilatation. Electronically Signed   By: Jeannine Boga M.D.   On: 03/29/2018 05:00        Scheduled Meds: . metoprolol tartrate  5 mg Intravenous Q12H  . montelukast  10 mg Oral QHS  . pravastatin   40 mg Oral q morning - 10a  . traZODone  50 mg Oral QHS  . venlafaxine XR  225 mg Oral Daily   Continuous Infusions: . famotidine (PEPCID) IV 20 mg (03/30/18 1056)  . lactated ringers 75 mL/hr at 03/29/18 1855  . piperacillin-tazobactam (ZOSYN)  IV 3.375 g (03/30/18 0559)     LOS: 1 day     Georgette Shell, MD [facility] If 7PM-7AM, please contact night-coverage [email or web address] Password [email or web address] 03/30/2018, 11:54 AM

## 2018-03-30 NOTE — Progress Notes (Signed)
[facility] Progress Note  1 Day Post-Op  Subjective: CC:  C/o abdominal soreness but otherwise feels ok. States she had one episode of emesis post-op but her nausea has resolved. Did not eat much breakfast. Denies flatus/BM. Currently sitting up in the chair. States that she will be staying with her daughter at discharge and has an additional "caretaker" at home who helps her with ADLs.   Objective: Vital signs in last 24 hours: Temp:  [97.2 F (36.2 C)-98.2 F (36.8 C)] 97.2 F (36.2 C) (07/09 0642) Pulse Rate:  [66-89] 72 (07/09 0642) Resp:  [14-22] 15 (07/09 0642) BP: (116-174)/(56-99) 164/78 (07/09 0642) SpO2:  [96 %-99 %] 97 % (07/09 0642) Weight:  [71.2 kg (157 lb)] 71.2 kg (157 lb) (07/08 1111)    Intake/Output from previous day: 07/08 0701 - 07/09 0700 In: 2347.3 [P.O.:50; I.V.:2132.9; IV Piggyback:164.4] Out: -  Intake/Output this shift: No intake/output data recorded.  PE: Gen:  Alert, NAD, pleasant and cooperative  Pulm:  Normal effort Abd: Soft, appropriately tender, non-distended, bowel sounds present in all 4 quadrants, incisions C/D/I Skin: warm and dry, no rashes  Psych: A&Ox3   Lab Results:  Recent Labs    03/29/18 0131 03/29/18 0144 03/30/18 0509  WBC 12.7*  --  14.7*  HGB 14.4 16.7* 13.5  HCT 48.8* 49.0* 46.2*  PLT 215  --  173   BMET Recent Labs    03/29/18 0131 03/29/18 0144 03/30/18 0509  NA 142 139 142  K 4.0 3.8 4.5  CL 102 99 103  CO2 30  --  30  GLUCOSE 174* 167* 139*  BUN 22 25* 19  CREATININE 1.00 0.90 1.06*  CALCIUM 9.2  --  8.9   PT/INR No results for input(s): LABPROT, INR in the last 72 hours. CMP     Component Value Date/Time   NA 142 03/30/2018 0509   NA 140 03/18/2018 0948   K 4.5 03/30/2018 0509   CL 103 03/30/2018 0509   CO2 30 03/30/2018 0509   GLUCOSE 139 (H) 03/30/2018 0509   BUN 19 03/30/2018 0509   BUN 17 03/18/2018 0948   CREATININE 1.06 (H) 03/30/2018 0509   CREATININE 1.16 (H)  10/23/2016 1222   CALCIUM 8.9 03/30/2018 0509   PROT 5.8 (L) 03/30/2018 0509   ALBUMIN 3.0 (L) 03/30/2018 0509   AST 29 03/30/2018 0509   ALT 24 03/30/2018 0509   ALKPHOS 73 03/30/2018 0509   BILITOT 1.0 03/30/2018 0509   GFRNONAA 45 (L) 03/30/2018 0509   GFRNONAA 42 (L) 02/06/2015 1619   GFRAA 53 (L) 03/30/2018 0509   GFRAA 49 (L) 02/06/2015 1619   Lipase     Component Value Date/Time   LIPASE 22 07/02/2008 1954       Studies/Results: Dg Cholangiogram Operative  Result Date: 03/30/2018 CLINICAL DATA:  Gallstones EXAM: INTRAOPERATIVE CHOLANGIOGRAM TECHNIQUE: Cholangiographic images from the C-arm fluoroscopic device were submitted for interpretation post-operatively. Please see the procedural report for the amount of contrast and the fluoroscopy time utilized. COMPARISON:  None. FINDINGS: Contrast fills the cystic duct and intrahepatic biliary tree. The common bile duct and duodenum fail to opacify. IMPRESSION: Findings are consistent with common bile duct obstruction. Electronically Signed   By: Marybelle Killings M.D.   On: 03/30/2018 07:34   Ct Angio Abd/pel W And/or Wo Contrast  Result Date: 03/29/2018 CLINICAL DATA:  84 year old with abdominal pain and clinical concern for mesenteric ischemia. EXAM: CTA ABDOMEN AND PELVIS wITHOUT AND WITH CONTRAST TECHNIQUE: Multidetector  CT imaging of the abdomen and pelvis was performed using the standard protocol during bolus administration of intravenous contrast. Multiplanar reconstructed images and MIPs were obtained and reviewed to evaluate the vascular anatomy. CONTRAST:  139mL ISOVUE-370 IOPAMIDOL (ISOVUE-370) INJECTION 76% COMPARISON:  None. FINDINGS: VASCULAR Aorta: Tortuous distal thoracic and abdominal aorta. No dissection. Ectatic infrarenal aorta maximal dimension 2.8 cm. Moderate calcified plaque throughout. 13 mm low-density structure medially to the right of the aorta at the diaphragmatic hiatus may be partially thrombosed aneurysm or  lymphatic malformation, appears similar to recent lumbar spine CTs. Celiac: Mild plaque at the origin without significant stenosis. No dissection. SMA: Mild plaque at the origin without significant stenosis. No dissection. Distal branches are patent. Renals: Accessory left upper pole renal artery. No dissection or significant stenosis. IMA: Patent.  No aneurysm. Inflow: Tortuous without evidence of aneurysm or dissection. Calcified plaque without significant stenosis. Proximal Outflow: Bilateral common femoral and visualized portions of the superficial and profunda femoral arteries are patent without evidence of aneurysm, dissection, vasculitis or significant stenosis. Veins: Portal and mesenteric veins are patent without filling defect. No obvious venous abnormality. Review of the MIP images confirms the above findings. NON-VASCULAR Lower chest: Multi chamber cardiomegaly with pacemaker partially included. There are coronary artery calcifications. Large hiatal hernia with greater than 50% of the stomach being intrathoracic. Hepatobiliary: Subcentimeter hypodensity in the left lobe of the liver is too small to characterize but likely cyst. Suspect small low-density lesions in the right hepatic dome partially obscured by adjacent streak artifact. Small subcapsular cyst in the right lobe. 2.6 cm gallstone in the gallbladder. Common bile duct is dilated measuring 11 mm distally, no visualized choledocholithiasis. Pancreas: Parenchymal atrophy. No ductal dilatation or inflammation. Spleen: Normal in size without focal abnormality. Adrenals/Urinary Tract: Mild adrenal thickening without dominant nodule. No hydronephrosis or perinephric edema. Multiple right renal cysts including parapelvic cyst in the lower right kidney. Urinary bladder is partially distended without wall thickening. Stomach/Bowel: Large hiatal hernia with greater than 50% of the stomach being intrathoracic, both intrathoracic and intra-abdominal stomach  is distended with ingested contents with air-fluid level. There is no small bowel dilatation, wall thickening or inflammatory change. No bowel obstruction. Mobile cecum located in the right central abdomen. Appendix not confidently visualized. Moderate colonic stool burden. Mild diverticulosis of the distal colon without diverticulitis. Lymphatic: No enlarged abdominal or pelvic lymph nodes. Reproductive: Uterus not well visualized, atrophic versus surgically absent. No adnexal mass. Other: No free air or ascites. Tiny fat containing umbilical hernia. Musculoskeletal: Scoliosis and multilevel degenerative change throughout spine. Vertebral body hemangioma within T11. Surgical hardware posterior L4-L5. IMPRESSION: VASCULAR 1. No acute vascular abnormality. Patent mesenteric vessels without secondary findings of mesenteric ischemia. 2. Aortic atherosclerosis and tortuosity ( Aortic Atherosclerosis (ICD10-I70.0).) Ectatic abdominal aorta at risk for aneurysm development. Recommend followup by ultrasound in 5 years. This recommendation follows ACR consensus guidelines: White Paper of the ACR Incidental Findings Committee II on Vascular Findings. J Am Coll Radiol 2013; 10:789-794. 3. Multi chamber cardiomegaly. NON-VASCULAR 1. Gallstone in the gallbladder without CT findings of gallbladder inflammation. Mild biliary dilatation without visualized choledocholithiasis. 2. Large hiatal hernia, greater than 50% of the stomach is intrathoracic. Intrathoracic and intra-abdominal stomach are distended with ingested contents containing air-fluid level suggesting slow transit. 3. Colonic diverticulosis without diverticulitis. Electronically Signed   By: Jeb Levering M.D.   On: 03/29/2018 04:42   US Abdomen Limited Ruq  Result Date: 03/29/2018 CLINICAL DATA:  Initial evaluation for acute abdominal pain. EXAM:  ULTRASOUND ABDOMEN LIMITED RIGHT UPPER QUADRANT COMPARISON:  Prior radiograph from 03/29/2018 FINDINGS: Gallbladder:  2.6 cm shadowing echogenic stone present within the gallbladder lumen. Gallbladder wall thickened up to 4 mm. No sonographic Murphy sign elicited on exam. No free pericholecystic fluid. Common bile duct: Diameter: 7.2 mm, likely within normal limits for age. Liver: No focal lesion identified. Within normal limits in parenchymal echogenicity. Portal vein is patent on color Doppler imaging with normal direction of blood flow towards the liver. IMPRESSION: 1. Cholelithiasis with mild gallbladder wall thickening. Clinical correlation for possible acute cholecystitis recommended. 2. No biliary dilatation. Electronically Signed   By: Jeannine Boga M.D.   On: 03/29/2018 05:00    Anti-infectives: Anti-infectives (From admission, onward)   Start     Dose/Rate Route Frequency Ordered Stop   03/29/18 1400  piperacillin-tazobactam (ZOSYN) IVPB 3.375 g     3.375 g 12.5 mL/hr over 240 Minutes Intravenous Every 8 hours 03/29/18 0459     03/29/18 0500  piperacillin-tazobactam (ZOSYN) IVPB 3.375 g     3.375 g 100 mL/hr over 30 Minutes Intravenous  Once 03/29/18 0451 03/29/18 6294     Assessment/Plan Afib Tachybrady syndrome s/p PM placement H/o DVT/PE - off anticoagulation since 10/2017 due to age and frequent falls Chronic diastolic CHF - ECHO 7/65/4650 EF 50-55% HTN HLD COPD on home O2 PRN Large hiatal hernia Code status DNR  Chronic cholecystitis S/P LAPAROSCOPIC CHOLECYSTECTOMY W/ IOC 03/29/18 Dr. Lucia Gaskins  -  POD#1, afebrile, VSS  -  continue heart healthy diet   -  PO pain control  -  PT/OT evals   -  Incentive spirometry q 1h   FEN: HH ID: Zosyn 7/8 >> VTE: SCD's, Ok for chemical VTE from surgical perspective  Foley: external cath    Plan - patient would definitely benefit from PT/OT, incentive spirometry, and more time to slowly increase PO intake. Anticipate she will be stable for discharge in next 24-48h. I have put her discharge instructions in her chart and am scheduling her for  post-op follow up in our office.    LOS: 1 day    [facility] 03/30/2018, 10:40 AM Pager: [phone] Consults: [phone] Mon-Fri 7:00 am-4:30 pm Sat-Sun 7:00 am-11:30 am

## 2018-03-31 DIAGNOSIS — K810 Acute cholecystitis: Secondary | ICD-10-CM

## 2018-03-31 LAB — CBC
HCT: 40.7 % (ref 36.0–46.0)
Hemoglobin: 11.8 g/dL — ABNORMAL LOW (ref 12.0–15.0)
MCH: 25.3 pg — ABNORMAL LOW (ref 26.0–34.0)
MCHC: 29 g/dL — ABNORMAL LOW (ref 30.0–36.0)
MCV: 87.2 fL (ref 78.0–100.0)
PLATELETS: 148 10*3/uL — AB (ref 150–400)
RBC: 4.67 MIL/uL (ref 3.87–5.11)
RDW: 17.5 % — AB (ref 11.5–15.5)
WBC: 12 10*3/uL — AB (ref 4.0–10.5)

## 2018-03-31 LAB — BASIC METABOLIC PANEL
Anion gap: 10 (ref 5–15)
BUN: 23 mg/dL (ref 8–23)
CO2: 29 mmol/L (ref 22–32)
CREATININE: 1.1 mg/dL — AB (ref 0.44–1.00)
Calcium: 8.3 mg/dL — ABNORMAL LOW (ref 8.9–10.3)
Chloride: 100 mmol/L (ref 98–111)
GFR, EST AFRICAN AMERICAN: 50 mL/min — AB (ref >60)
GFR, EST NON AFRICAN AMERICAN: 44 mL/min — AB (ref >60)
Glucose, Bld: 96 mg/dL (ref 70–99)
POTASSIUM: 4.2 mmol/L (ref 3.5–5.1)
SODIUM: 139 mmol/L (ref 135–145)

## 2018-03-31 LAB — URINE CULTURE: CULTURE: NO GROWTH

## 2018-03-31 NOTE — Evaluation (Signed)
Occupational Therapy Evaluation Patient Details Name: Raven Gray MRN: [id number] DOB: 1928/09/19 Today's Date: 03/31/2018    History of Present Illness Raven Gray is a 84 y.o. female with medical history significant of afib not on AC; tachybradycardia syndrome status post pacemaker, CVA, hypertension, hyperlipidemia, history of VT,and COPD on prn home O2 presenting with abdominal pain. Cholecystitis with sepsis, now s/p cholecystectomy   Clinical Impression   Pt with decline in function and safety with ADLs and ADL mobility with decreased strength, balance, endurance and hx of cognitive impairments. PTA, pt lives at  home with her daughter and pt always has 24/7 sup form Financial risk analyst. Pt and family planning to d/c to SNF for short term therapy before return home. No further acute OT is indicated at this time and will defer further OT intervention to SNF    Follow Up Recommendations       Equipment Recommendations  Other (comment)(TBD at SNF)    Recommendations for Other Services       Precautions / Restrictions Precautions Precautions: Fall Restrictions Weight Bearing Restrictions: No      Mobility Bed Mobility               General bed mobility comments: pt up in recliner upon arrival  Transfers Overall transfer level: Needs assistance Equipment used: Rolling walker (2 wheeled) Transfers: Sit to/from Stand Sit to Stand: Min assist         General transfer comment: assist for safety and multiple cues for waiting until equipment set up completed    Balance Overall balance assessment: Needs assistance Sitting-balance support: Feet supported Sitting balance-Leahy Scale: Fair Sitting balance - Comments: even supported in recliner leans to R and slouches in chair even with pillow for positioning   Standing balance support: Bilateral upper extremity supported;No upper extremity supported;During functional activity Standing balance-Leahy Scale: Poor Standing  balance comment: UE support and minguard for static balance                           ADL either performed or assessed with clinical judgement   ADL Overall ADL's : Needs assistance/impaired     Grooming: Wash/dry hands;Wash/dry face;Standing;Min guard;Cueing for safety;Cueing for sequencing   Upper Body Bathing: Set up;Supervision/ safety   Lower Body Bathing: Moderate assistance   Upper Body Dressing : Set up;Supervision/safety   Lower Body Dressing: Moderate assistance   Toilet Transfer: Minimal assistance;RW   Toileting- Clothing Manipulation and Hygiene: Minimal assistance;Sit to/from stand       Functional mobility during ADLs: Minimal assistance;Rolling walker;Cueing for safety       Vision Baseline Vision/History: Wears glasses Wears Glasses: Reading only Patient Visual Report: No change from baseline       Perception     Praxis      Pertinent Vitals/Pain Pain Assessment: No/denies pain Faces Pain Scale: No hurt Pain Location: abdomen Pain Descriptors / Indicators: Aching;Sore Pain Intervention(s): Monitored during session     Hand Dominance Right   Extremity/Trunk Assessment Upper Extremity Assessment Upper Extremity Assessment: Generalized weakness   Lower Extremity Assessment Lower Extremity Assessment: Defer to PT evaluation       Communication Communication Communication: No difficulties   Cognition Arousal/Alertness: Awake/alert Behavior During Therapy: WFL for tasks assessed/performed;Impulsive Overall Cognitive Status: Impaired/Different from baseline Area of Impairment: Orientation;Problem solving                 Orientation Level: Disoriented to;Time Current Attention Level: Sustained Memory:  Decreased recall of precautions;Decreased short-term memory       Problem Solving: Slow processing;Requires verbal cues;Requires tactile cues     General Comments  on 2L O2 with ambulation SpO2 reading inaccurate due to  using hand with probe, lowest reading 79% quickly back to 90's standing with less UE pressure    Exercises     Shoulder Instructions      Home Living Family/patient expects to be discharged to:: Skilled nursing facility Living Arrangements: Children Available Help at Discharge: Family;Available 24 hours/day Type of Home: House Home Access: Ramped entrance;Stairs to enter Entrance Stairs-Number of Steps: 2 Entrance Stairs-Rails: None Home Layout: One level     Bathroom Shower/Tub: Teacher, early years/pre: Handicapped height Bathroom Accessibility: Yes   Home Equipment: Clinical cytogeneticist - 2 wheels;Cane - single point;Bedside commode;Wheelchair - manual   Additional Comments: Uses supplemental O2 at home as needed      Prior Functioning/Environment Level of Independence: Independent with assistive device(s)        Comments: ambulates with either SPC or RW        OT Problem List: Decreased strength;Decreased activity tolerance;Decreased cognition;Decreased knowledge of use of DME or AE;Impaired balance (sitting and/or standing);Decreased coordination;Decreased safety awareness;Decreased knowledge of precautions      OT Treatment/Interventions:      OT Goals(Current goals can be found in the care plan section) Acute Rehab OT Goals Patient Stated Goal: get better OT Goal Formulation: With patient/family  OT Frequency:     Barriers to D/C: Decreased caregiver support          Co-evaluation              AM-PAC PT "6 Clicks" Daily Activity     Outcome Measure Help from another person eating meals?: None Help from another person taking care of personal grooming?: A Little Help from another person toileting, which includes using toliet, bedpan, or urinal?: A Lot Help from another person bathing (including washing, rinsing, drying)?: A Lot Help from another person to put on and taking off regular upper body clothing?: A Little Help from another  person to put on and taking off regular lower body clothing?: A Lot 6 Click Score: 16   End of Session Equipment Utilized During Treatment: Gait belt;Rolling walker  Activity Tolerance: Patient tolerated treatment well Patient left: in chair;with call bell/phone within reach;with chair alarm set;with family/visitor present  OT Visit Diagnosis: Unsteadiness on feet (R26.81);Other abnormalities of gait and mobility (R26.89);Muscle weakness (generalized) (M62.81);Other symptoms and signs involving cognitive function                Time: 5320-2334 OT Time Calculation (min): 26 min Charges:  OT General Charges $OT Visit: 1 Visit OT Evaluation $OT Eval Moderate Complexity: 1 Mod G-Codes: OT G-codes **NOT FOR INPATIENT CLASS** Functional Assessment Tool Used: AM-PAC 6 Clicks Daily Activity     Britt Bottom 03/31/2018, 1:15 PM

## 2018-03-31 NOTE — Consult Note (Signed)
   [facility] Inpatient Consult   03/31/2018  JAELAH HAUTH April 15, 1928 [id number]    Telephone call from [facility] with [facility] (home based outpatient palliative care program administered by [place]) to inform that Mrs. Gago was active with [facility] prior to hospitalization.  Spoke with inpatient RNCM to make aware of above notes.  Noted discharge plan likely SNF however.   Marthenia Rolling, MSN-Ed, RN,BSN [facility] Liaison [phone]

## 2018-03-31 NOTE — Progress Notes (Signed)
Patient's daughter has chosen [facility] or [facility]. Will submit pasrr information and have facilities review referral.   Cedric Fishman LCSW [phone]

## 2018-03-31 NOTE — Progress Notes (Signed)
PROGRESS NOTE  Raven Gray IHK:[id number] DOB: 1928-01-19 DOA: 03/29/2018 PCP: Janie Morning, DO  HPI/Recap of past 24 hours:  Sitting up in chair, denies ab pain, no n/v, no fever  Assessment/Plan: Principal Problem:   Acute cholecystitis Active Problems:   COPD (chronic obstructive pulmonary disease) (HCC)   Essential hypertension   Chronic atrial fibrillation (HCC)   Sepsis (HCC)   HLD (hyperlipidemia)    status post laparoscopic cholecystectomy secondary to acute cholecystitis  Improving, no n/v today, she is continued on zosyn, wbc trending down Path+ adnocarcinoma of gallblader , per general surgery  Dr Lucia Gaskins "There was no suspicion of her having gall bladder cancer at the time of surgery.  I saw no intraperitoneal implants or liver nodules.  There are photos of the right lobe of the liver at the end of my op note. The CT scan showed nothing suspicious for cancer. I have ordered CEA and CA19-9 levels for a baseline." Outpatient follow up with oncology recommended  General surgery input appreciated   chronic atrial fibrillation not on anticoagulation due to falls.  hypertension restart Lopressor.  COPD stable patient has oxygen at home on a as needed basis.  FTT: SNF placement   Code Status: DNR  Family Communication: patient   Disposition Plan: SNF 7/11   Consultants:  General surgery  Procedures:  status post laparoscopic cholecystectomy  Antibiotics:  zosyn   Objective: BP (!) 148/92 (BP Location: Left Arm)   Pulse 64   Temp 98.2 F (36.8 C) (Axillary)   Resp 18   Ht 5\' 7"  (1.702 m)   Wt 71.2 kg (157 lb)   SpO2 97%   BMI 24.59 kg/m   Intake/Output Summary (Last 24 hours) at 03/31/2018 1816 Last data filed at 03/31/2018 1735 Gross per 24 hour  Intake 1454.43 ml  Output 1000 ml  Net 454.43 ml   Filed Weights   03/29/18 0110 03/29/18 1111  Weight: 71.2 kg (157 lb) 71.2 kg (157 lb)    Exam: Patient is examined daily including today  on 03/31/2018, exams remain the same as of yesterday except that has changed    General:  NAD  Cardiovascular: IRRR  Respiratory: CTABL  Abdomen: Soft/ND/NT, positive BS, incisions clean and dry  Musculoskeletal: No Edema  Neuro: alert, oriented   Data Reviewed: Basic Metabolic Panel: Recent Labs  Lab 03/29/18 0131 03/29/18 0144 03/30/18 0509 03/31/18 0511  NA 142 139 142 139  K 4.0 3.8 4.5 4.2  CL 102 99 103 100  CO2 30  --  30 29  GLUCOSE 174* 167* 139* 96  BUN 22 25* 19 23  CREATININE 1.00 0.90 1.06* 1.10*  CALCIUM 9.2  --  8.9 8.3*  MG 2.0  --   --   --    Liver Function Tests: Recent Labs  Lab 03/29/18 0131 03/30/18 0509  AST 22 29  ALT 21 24  ALKPHOS 86 73  BILITOT 0.8 1.0  PROT 6.7 5.8*  ALBUMIN 3.5 3.0*   No results for input(s): LIPASE, AMYLASE in the last 168 hours. No results for input(s): AMMONIA in the last 168 hours. CBC: Recent Labs  Lab 03/29/18 0131 03/29/18 0144 03/30/18 0509 03/31/18 0511  WBC 12.7*  --  14.7* 12.0*  HGB 14.4 16.7* 13.5 11.8*  HCT 48.8* 49.0* 46.2* 40.7  MCV 85.9  --  86.0 87.2  PLT 215  --  173 148*   Cardiac Enzymes:   No results for input(s): CKTOTAL, CKMB, CKMBINDEX, TROPONINI  in the last 168 hours. BNP (last 3 results) Recent Labs    07/01/17 0825 11/06/17 0946 03/29/18 0131  BNP 555.2* 450.1* 811.5*    ProBNP (last 3 results) No results for input(s): PROBNP in the last 8760 hours.  CBG: No results for input(s): GLUCAP in the last 168 hours.  Recent Results (from the past 240 hour(s))  Urine culture     Status: None   Collection Time: 03/29/18  4:51 AM  Result Value Ref Range Status   Specimen Description URINE, CATHETERIZED  Final   Special Requests NONE  Final   Culture   Final    NO GROWTH Performed at [facility], [address] [place], [place] [address]    Report Status 03/31/2018 FINAL  Final  Blood culture (routine x 2)     Status: None (Preliminary result)   Collection  Time: 03/29/18  6:13 AM  Result Value Ref Range Status   Specimen Description BLOOD RIGHT WRIST  Final   Special Requests   Final    BOTTLES DRAWN AEROBIC AND ANAEROBIC Blood Culture adequate volume   Culture   Final    NO GROWTH 2 DAYS Performed at [facility], [place] [address], [place], [place] [address]    Report Status PENDING  Incomplete  Blood culture (routine x 2)     Status: None (Preliminary result)   Collection Time: 03/29/18  7:10 AM  Result Value Ref Range Status   Specimen Description BLOOD RIGHT HAND  Final   Special Requests   Final    BOTTLES DRAWN AEROBIC AND ANAEROBIC Blood Culture adequate volume   Culture   Final    NO GROWTH 2 DAYS Performed at [facility], [place] [address] [place], [place] [address]    Report Status PENDING  Incomplete  Surgical pcr screen     Status: None   Collection Time: 03/29/18  1:22 PM  Result Value Ref Range Status   MRSA, PCR NEGATIVE NEGATIVE Final   Staphylococcus aureus NEGATIVE NEGATIVE Final    Comment: (NOTE) The Xpert SA Assay (FDA approved for NASAL specimens in patients 42 years of age and older), is one component of a comprehensive surveillance program. It is not intended to diagnose infection nor to guide or monitor treatment. Performed at [facility], [place] [address], [place], [place] [address]      Studies: No results found.  Scheduled Meds: . acetaminophen  650 mg Oral TID   Or  . acetaminophen  650 mg Rectal TID  . mouth rinse  15 mL Mouth Rinse BID  . metoprolol tartrate  25 mg Oral Daily  . montelukast  10 mg Oral QHS  . pravastatin  40 mg Oral q morning - 10a  . traZODone  50 mg Oral QHS  . venlafaxine XR  225 mg Oral Daily    Continuous Infusions: . sodium chloride Stopped (03/31/18 2458)  . famotidine (PEPCID) IV 20 mg (03/31/18 0956)  . lactated ringers 75 mL/hr at 03/31/18 0951  . piperacillin-tazobactam (ZOSYN)  IV 3.375 g (03/31/18 1357)     Time spent: 37mins I have  personally reviewed and interpreted on  03/31/2018 daily labs, tele strips, imagings as discussed above under date review session and assessment and plans.  I reviewed all nursing notes, pharmacy notes, consultant notes,  vitals, pertinent old records  I have discussed plan of care as described above with RN , patient on 03/31/2018   Florencia Reasons  MD, PhD  [facility] Pager [phone]. If 7PM-7AM, please contact night-coverage at [email or web address], password [facility] 03/31/2018, 6:16 PM  LOS: 2 days

## 2018-03-31 NOTE — Progress Notes (Signed)
Physical Therapy Treatment Patient Details Name: Raven Gray MRN: [id number] DOB: January 07, 1928 Today's Date: 03/31/2018    History of Present Illness Raven Gray is a 84 y.o. female with medical history significant of afib not on AC; tachybradycardia syndrome status post pacemaker, CVA, hypertension, hyperlipidemia, history of VT,and COPD on prn home O2 presenting with abdominal pain. Cholecystitis with sepsis, now s/p cholecystectomy    PT Comments    Patient progressing with ambulation in hallway with assist with RW.  Remains high fall risk due to posture and gait abnormalities and at high fall risk.  Continue to recommend SNF level rehab at d/c.  PT to follow until d/c.   Follow Up Recommendations  SNF;No PT follow up(daughter requesting [facility] in [place])     Equipment Recommendations  None recommended by PT    Recommendations for Other Services       Precautions / Restrictions Precautions Precautions: Fall Restrictions Weight Bearing Restrictions: No    Mobility  Bed Mobility               General bed mobility comments: in recliner  Transfers Overall transfer level: Needs assistance Equipment used: Rolling walker (2 wheeled) Transfers: Sit to/from Stand Sit to Stand: Min assist         General transfer comment: assist for safety and multiple cues for waiting until equipment set up completed  Ambulation/Gait Ambulation/Gait assistance: Mod assist;Min assist Gait Distance (Feet): 120 Feet Assistive device: Rolling walker (2 wheeled) Gait Pattern/deviations: Shuffle;Wide base of support;Trunk flexed;Decreased stride length;Decreased dorsiflexion - left;Step-to pattern;Step-through pattern     General Gait Details: remains flexed throughout, severeal stops to reposition inside walker due to tends to push walker too far out and keeps L foot outside walker.  Reports has had therapy on L leg so it is better than it was, has severe arthritic changes in knee,  and joint changes in ankle (?hip)   Stairs             Wheelchair Mobility    Modified Rankin (Stroke Patients Only)       Balance Overall balance assessment: Needs assistance Sitting-balance support: Feet supported Sitting balance-Leahy Scale: Fair Sitting balance - Comments: even supported in recliner leans to R and slouches in chair even with pillow for positioning     Standing balance-Leahy Scale: Poor Standing balance comment: UE support and minguard for static balance                            Cognition Arousal/Alertness: Awake/alert Behavior During Therapy: WFL for tasks assessed/performed;Impulsive Overall Cognitive Status: Impaired/Different from baseline Area of Impairment: Orientation;Problem solving                 Orientation Level: Disoriented to;Time Current Attention Level: Sustained Memory: Decreased recall of precautions;Decreased short-term memory       Problem Solving: Slow processing;Requires verbal cues;Requires tactile cues        Exercises      General Comments General comments (skin integrity, edema, etc.): on 2L O2 with ambulation SpO2 reading inaccurate due to using hand with probe, lowest reading 79% quickly back to 90's standing with less UE pressure      Pertinent Vitals/Pain Faces Pain Scale: Hurts little more Pain Location: abdomen Pain Descriptors / Indicators: Aching;Sore Pain Intervention(s): Monitored during session;Repositioned    Home Living  Prior Function            PT Goals (current goals can now be found in the care plan section) Progress towards PT goals: Progressing toward goals    Frequency    Min 3X/week      PT Plan Current plan remains appropriate    Co-evaluation              AM-PAC PT "6 Clicks" Daily Activity  Outcome Measure  Difficulty turning over in bed (including adjusting bedclothes, sheets and blankets)?: A Little Difficulty  moving from lying on back to sitting on the side of the bed? : A Little Difficulty sitting down on and standing up from a chair with arms (e.g., wheelchair, bedside commode, etc,.)?: Unable Help needed moving to and from a bed to chair (including a wheelchair)?: A Little Help needed walking in hospital room?: A Lot Help needed climbing 3-5 steps with a railing? : A Lot 6 Click Score: 14    End of Session Equipment Utilized During Treatment: Gait belt;Oxygen Activity Tolerance: Patient limited by fatigue Patient left: with call bell/phone within reach;in chair;with family/visitor present;Other (comment)(NT to bathe so chair alarm not set) Nurse Communication: Other (comment)(chair alarm not set) PT Visit Diagnosis: Unsteadiness on feet (R26.81);Other abnormalities of gait and mobility (R26.89);Muscle weakness (generalized) (M62.81)     Time: 1030-1055 PT Time Calculation (min) (ACUTE ONLY): 25 min  Charges:  $Gait Training: 8-22 mins $Therapeutic Activity: 8-22 mins                    G CodesMagda Kiel, [place] [phone] 03/31/2018    Reginia Naas 03/31/2018, 11:12 AM

## 2018-03-31 NOTE — Care Management Note (Signed)
Case Management Note  Patient Details  Name: Raven Gray MRN: [id number] Date of Birth: 1927/09/28  Subjective/Objective:   Presents with abdominal pain/ cholecystitis. From home with family.  Active with [facility] [phone]), PTA.         s/p  LAPAROSCOPIC CHOLECYSTECTOMY WITH INTRAOPERATIVE CHOLANGIOGRAM ,7/8         Aretha Parrot (Daughter) Ival Bible (Daughter)    [phone]     PCP: Janie Morning  Action/Plan: Per PT's recommendations: SNF placement. Family agreeable to SNF placement. CSW managing disposition SNF...Marland KitchenMarland Kitchen Pt to transition to facility when medically ready.  Expected Discharge Date:                  Expected Discharge Plan:  Skilled Nursing Facility  In-House Referral:  Clinical Social Work  Discharge planning Services     Post Acute Care Choice:    Choice offered to:     DME Arranged:   N/A DME Agency:   N/A  HH Arranged:   N/A HH Agency:    N/A Status of Service:  Completed, signed off  If discussed at [facility] of Stay Meetings, dates discussed:    Additional Comments:  Sharin Mons, RN 03/31/2018, 2:41 PM

## 2018-03-31 NOTE — Progress Notes (Addendum)
[facility] Progress Note  2 Days Post-Op  Subjective: CC:  Abdominal soreness present. Tolerating PO. Having some flatus. Denies BM. Mobilizing with therapies.  Objective: Vital signs in last 24 hours: Temp:  [96.7 F (35.9 C)-98.3 F (36.8 C)] 98.3 F (36.8 C) (07/10 0400) Pulse Rate:  [60-73] 73 (07/10 0948) Resp:  [11-20] 15 (07/10 0805) BP: (115-180)/(64-117) 166/82 (07/10 0948) SpO2:  [85 %-99 %] 99 % (07/10 0805) Last BM Date: (PTA)  Intake/Output from previous day: 07/09 0701 - 07/10 0700 In: 1782.6 [P.O.:420; I.V.:1112.5; IV Piggyback:250.1] Out: 1400 [Urine:1400] Intake/Output this shift: No intake/output data recorded.  PE: Gen:  Alert, NAD, pleasant Pulm:  Normal effort Abd: Soft, appropriately tender, +BS, incisions clean and dry Skin: warm and dry, no rashes  Psych: A&Ox3   Lab Results:  Recent Labs    03/30/18 0509 03/31/18 0511  WBC 14.7* 12.0*  HGB 13.5 11.8*  HCT 46.2* 40.7  PLT 173 148*   BMET Recent Labs    03/30/18 0509 03/31/18 0511  NA 142 139  K 4.5 4.2  CL 103 100  CO2 30 29  GLUCOSE 139* 96  BUN 19 23  CREATININE 1.06* 1.10*  CALCIUM 8.9 8.3*   PT/INR No results for input(s): LABPROT, INR in the last 72 hours. CMP     Component Value Date/Time   NA 139 03/31/2018 0511   NA 140 03/18/2018 0948   K 4.2 03/31/2018 0511   CL 100 03/31/2018 0511   CO2 29 03/31/2018 0511   GLUCOSE 96 03/31/2018 0511   BUN 23 03/31/2018 0511   BUN 17 03/18/2018 0948   CREATININE 1.10 (H) 03/31/2018 0511   CREATININE 1.16 (H) 10/23/2016 1222   CALCIUM 8.3 (L) 03/31/2018 0511   PROT 5.8 (L) 03/30/2018 0509   ALBUMIN 3.0 (L) 03/30/2018 0509   AST 29 03/30/2018 0509   ALT 24 03/30/2018 0509   ALKPHOS 73 03/30/2018 0509   BILITOT 1.0 03/30/2018 0509   GFRNONAA 44 (L) 03/31/2018 0511   GFRNONAA 42 (L) 02/06/2015 1619   GFRAA 50 (L) 03/31/2018 0511   GFRAA 49 (L) 02/06/2015 1619   Lipase     Component Value Date/Time    LIPASE 22 07/02/2008 1954       Studies/Results: Dg Cholangiogram Operative  Result Date: 03/30/2018 CLINICAL DATA:  Gallstones EXAM: INTRAOPERATIVE CHOLANGIOGRAM TECHNIQUE: Cholangiographic images from the C-arm fluoroscopic device were submitted for interpretation post-operatively. Please see the procedural report for the amount of contrast and the fluoroscopy time utilized. COMPARISON:  None. FINDINGS: Contrast fills the cystic duct and intrahepatic biliary tree. The common bile duct and duodenum fail to opacify. IMPRESSION: Findings are consistent with common bile duct obstruction. Electronically Signed   By: Marybelle Killings M.D.   On: 03/30/2018 07:34    Anti-infectives: Anti-infectives (From admission, onward)   Start     Dose/Rate Route Frequency Ordered Stop   03/29/18 1400  piperacillin-tazobactam (ZOSYN) IVPB 3.375 g     3.375 g 12.5 mL/hr over 240 Minutes Intravenous Every 8 hours 03/29/18 0459     03/29/18 0500  piperacillin-tazobactam (ZOSYN) IVPB 3.375 g     3.375 g 100 mL/hr over 30 Minutes Intravenous  Once 03/29/18 0451 03/29/18 1610     Assessment/Plan Afib Tachybrady syndrome s/p PM placement H/o DVT/PE - off anticoagulation since 10/2017 due to age and frequent falls Chronic diastolic CHF - ECHO 9/60/4540 EF 50-55% HTN HLD COPD on home O2 PRN Large hiatal hernia Code status DNR  Chronic cholecystitis S/P LAPAROSCOPIC CHOLECYSTECTOMY W/ IOC 03/29/18 Dr. Lucia Gaskins             -  POD#2, afebrile, VSS  -  pathology: T2 ADENOCARCINOMA, will discuss any future treatment plans with MD              -  continue heart healthy diet              -  PO pain control             -  PT/OT; PT recommending SNF              -  Incentive spirometry q 1h   FEN: HH ID: Zosyn 7/8 >> VTE: SCD's, Ok for chemical VTE from surgical perspective  Foley: external cath    Plan - stable for discharge to appropriately facility from surgical perspective. Follow up provided.    LOS: 2  days    Raven Gray , [facility] 03/31/2018, 10:23 AM Pager: [phone] Consults: [phone]  Agree with above.  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 (Stage II). There was no suspicion of her having gall bladder cancer at the time of surgery.  I saw no intraperitoneal implants or liver nodules.  There are photos of the right lobe of the liver at the end of my op note. The CT scan showed nothing suspicious for cancer. I have ordered CEA and CA19-9 levels for a baseline.  I did not tell the patient about the path report, because she was by herself. At her age, I suspect that there is nothing further to do.  If the family wanted further information, we could get an oncology consult as an outpatient.   Alphonsa Overall, MD, [facility] Pager: [phone] Office phone:  [phone]'

## 2018-04-01 DIAGNOSIS — R278 Other lack of coordination: Secondary | ICD-10-CM | POA: Diagnosis not present

## 2018-04-01 DIAGNOSIS — I509 Heart failure, unspecified: Secondary | ICD-10-CM | POA: Diagnosis not present

## 2018-04-01 DIAGNOSIS — J449 Chronic obstructive pulmonary disease, unspecified: Secondary | ICD-10-CM | POA: Diagnosis not present

## 2018-04-01 DIAGNOSIS — I110 Hypertensive heart disease with heart failure: Secondary | ICD-10-CM | POA: Diagnosis not present

## 2018-04-01 DIAGNOSIS — M6281 Muscle weakness (generalized): Secondary | ICD-10-CM | POA: Diagnosis not present

## 2018-04-01 DIAGNOSIS — C249 Malignant neoplasm of biliary tract, unspecified: Secondary | ICD-10-CM | POA: Diagnosis not present

## 2018-04-01 DIAGNOSIS — M1990 Unspecified osteoarthritis, unspecified site: Secondary | ICD-10-CM | POA: Diagnosis not present

## 2018-04-01 DIAGNOSIS — M6389 Disorders of muscle in diseases classified elsewhere, multiple sites: Secondary | ICD-10-CM | POA: Diagnosis not present

## 2018-04-01 DIAGNOSIS — K810 Acute cholecystitis: Secondary | ICD-10-CM | POA: Diagnosis not present

## 2018-04-01 DIAGNOSIS — R2689 Other abnormalities of gait and mobility: Secondary | ICD-10-CM | POA: Diagnosis not present

## 2018-04-01 DIAGNOSIS — I480 Paroxysmal atrial fibrillation: Secondary | ICD-10-CM | POA: Diagnosis not present

## 2018-04-01 DIAGNOSIS — I5033 Acute on chronic diastolic (congestive) heart failure: Secondary | ICD-10-CM | POA: Diagnosis not present

## 2018-04-01 DIAGNOSIS — A4189 Other specified sepsis: Secondary | ICD-10-CM | POA: Diagnosis not present

## 2018-04-01 DIAGNOSIS — R2681 Unsteadiness on feet: Secondary | ICD-10-CM | POA: Diagnosis not present

## 2018-04-01 DIAGNOSIS — R1314 Dysphagia, pharyngoesophageal phase: Secondary | ICD-10-CM | POA: Diagnosis not present

## 2018-04-01 DIAGNOSIS — I482 Chronic atrial fibrillation: Secondary | ICD-10-CM | POA: Diagnosis not present

## 2018-04-01 LAB — BASIC METABOLIC PANEL
Anion gap: 8 (ref 5–15)
BUN: 15 mg/dL (ref 8–23)
CHLORIDE: 103 mmol/L (ref 98–111)
CO2: 32 mmol/L (ref 22–32)
CREATININE: 0.91 mg/dL (ref 0.44–1.00)
Calcium: 8.3 mg/dL — ABNORMAL LOW (ref 8.9–10.3)
GFR calc non Af Amer: 55 mL/min — ABNORMAL LOW (ref >60)
GLUCOSE: 96 mg/dL (ref 70–99)
Potassium: 3.6 mmol/L (ref 3.5–5.1)
Sodium: 143 mmol/L (ref 135–145)

## 2018-04-01 LAB — CBC
HCT: 44.1 % (ref 36.0–46.0)
Hemoglobin: 13 g/dL (ref 12.0–15.0)
MCH: 25.2 pg — AB (ref 26.0–34.0)
MCHC: 29.5 g/dL — ABNORMAL LOW (ref 30.0–36.0)
MCV: 85.6 fL (ref 78.0–100.0)
PLATELETS: 148 10*3/uL — AB (ref 150–400)
RBC: 5.15 MIL/uL — AB (ref 3.87–5.11)
RDW: 17.5 % — ABNORMAL HIGH (ref 11.5–15.5)
WBC: 9.3 10*3/uL (ref 4.0–10.5)

## 2018-04-01 LAB — MAGNESIUM: Magnesium: 1.7 mg/dL (ref 1.7–2.4)

## 2018-04-01 MED ORDER — FUROSEMIDE 40 MG PO TABS: 40.0000 mg | ORAL_TABLET | Freq: Every day | ORAL | 0 refills | Status: DC

## 2018-04-01 MED ORDER — AMOXICILLIN-POT CLAVULANATE 500-125 MG PO TABS: 1.0000 | ORAL_TABLET | Freq: Two times a day (BID) | ORAL | 0 refills | Status: AC

## 2018-04-01 MED ORDER — ALPRAZOLAM 0.25 MG PO TABS: 0.2500 mg | ORAL_TABLET | Freq: Three times a day (TID) | ORAL | 0 refills | Status: DC | PRN

## 2018-04-01 NOTE — Progress Notes (Addendum)
Pt will DC to: [facility] ([place]) DC date: 04/01/2018 Family notified: Daughter-[name] Transport by: Daughter- Car   Please make sure signed Xanax script( on printer) goes with patient.  RN, patient, and facility notified of DC. Discharge Summary sent to facility. RN given number for report at  [phone] room 205. DC packet on chart.  CSW signing off. Thurmond Butts, [place] Social Worker [phone]

## 2018-04-01 NOTE — Plan of Care (Addendum)
Pt is preparing for discharge to [facility] home. Report given to [facility] at [facility].

## 2018-04-01 NOTE — Care Management Important Message (Signed)
Important Message  Patient Details  Name: Raven Gray MRN: [id number] Date of Birth: [birth year]   Medicare Important Message Given:  Yes    [name] [address] 04/01/2018, 2:33 PM

## 2018-04-01 NOTE — Progress Notes (Signed)
Nsg Discharge Note  Admit Date:  03/29/2018 Discharge date: 04/01/2018   Raven Gray to be D/C'd Skilled nursing facility per MD order.  AVS completed.  Copy for chart, and copy for patient signed, and dated. Patient/caregiver able to verbalize understanding.  Discharge Medication: Allergies as of 04/01/2018   No Known Allergies     Medication List    STOP taking these medications   amoxicillin-clavulanate 875-125 MG tablet Commonly known as:  AUGMENTIN Replaced by:  amoxicillin-clavulanate 500-125 MG tablet     TAKE these medications   acetaminophen 325 MG tablet Commonly known as:  TYLENOL Take 650 mg by mouth every 6 (six) hours as needed (for pain).   ALPRAZolam 0.25 MG tablet Commonly known as:  XANAX Take 1 tablet (0.25 mg total) by mouth 3 (three) times daily as needed for anxiety (anxiety).   amoxicillin-clavulanate 500-125 MG tablet Commonly known as:  AUGMENTIN Take 1 tablet (500 mg total) by mouth 2 (two) times daily for 3 days. Replaces:  amoxicillin-clavulanate 875-125 MG tablet   furosemide 40 MG tablet Commonly known as:  LASIX Take 1 tablet (40 mg total) by mouth daily. Take 1 tablet (40 mg) two times a day for 3 days. Then 1 tablet by mouth daily. What changed:  additional instructions   MAGNESIUM PO Take 1 tablet by mouth daily.   MELATONIN PO Take 1 tablet by mouth at bedtime.   metoprolol tartrate 25 MG tablet Commonly known as:  LOPRESSOR Take 1 tablet (25 mg total) by mouth daily.   montelukast 10 MG tablet Commonly known as:  SINGULAIR Take 10 mg by mouth at bedtime.   nitroGLYCERIN 0.4 MG SL tablet Commonly known as:  NITROSTAT Place 0.4 mg under the tongue every 5 (five) minutes as needed for chest pain. X 3 doses   omeprazole 20 MG capsule Commonly known as:  PRILOSEC Take 20 mg by mouth daily as needed (for reflux/heartburn).   OXYGEN Inhale 2 L into the lungs daily as needed (oxygen).   potassium chloride SA 20 MEQ  tablet Commonly known as:  K-DUR,KLOR-CON Take 1 tablet (20 mEq total) by mouth daily as needed (along with lasix.). What changed:  when to take this   pravastatin 40 MG tablet Commonly known as:  PRAVACHOL Take 40 mg by mouth every morning.   traZODone 50 MG tablet Commonly known as:  DESYREL Take 1 tablet by mouth at bedtime.   venlafaxine XR 150 MG 24 hr capsule Commonly known as:  EFFEXOR-XR Take 225 mg by mouth daily.   venlafaxine XR 75 MG 24 hr capsule Commonly known as:  EFFEXOR-XR Take 225 mg by mouth daily.   WOMENS 50+ MULTI VITAMIN/MIN Tabs Take 1 tablet by mouth daily.       Discharge Assessment: Vitals:   03/31/18 2009 04/01/18 0509  BP: (!) 156/78 (!) 164/87  Pulse: (!) 58 (!) 59  Resp: (!) 22 18  Temp: 98.3 F (36.8 C) 97.6 F (36.4 C)  SpO2: 96% 100%   Skin clean, dry and intact without evidence of skin break down, no evidence of skin tears noted. IV catheter discontinued intact. Site without signs and symptoms of complications - no redness or edema noted at insertion site, patient denies c/o pain - only slight tenderness at site.  Dressing with slight pressure applied.  D/c Instructions-Education: report was called to [name]. Discharge instructions given to patient/family with verbalized understanding. D/c education completed with patient/family including follow up instructions, medication list, d/c  activities limitations if indicated, with other d/c instructions as indicated by MD - patient able to verbalize understanding, all questions fully answered. Patient instructed to return to ED, call 911, or call MD for any changes in condition.  Patient escorted via [place], and D/C to [name] by daughter's private auto.  Hassan Rowan, RN 04/01/2018 11:52 AM

## 2018-04-01 NOTE — Discharge Summary (Signed)
Discharge Summary  Raven Gray XLK:[id number] DOB: 1928-01-30  PCP: Janie Morning, DO  Admit date: 03/29/2018 Discharge date: 04/01/2018  Time spent: 71mins  Recommendations for Outpatient Follow-up:  1. F/u with SNF MD  for hospital discharge follow up, repeat cbc/bmp at follow up. 2. F/u with general surgery Dr Lucia Gaskins on 7/26 3. F/u with oncology in 2-3 weeks "INVASIVE ADENOCARCINOMA, WELL DIFFERENTIATED, gallbladder ", cea, ca19-9 result pending at time of discharge. 4. F/u with cardiology  Discharge Diagnoses:  Active Hospital Problems   Diagnosis Date Noted  . Acute cholecystitis 03/29/2018  . HLD (hyperlipidemia) 11/02/2017  . Sepsis ([place]) 07/01/2017  . Chronic atrial fibrillation ([place])   . COPD (chronic obstructive pulmonary disease) ([place]) 09/06/2016  . Essential hypertension 09/06/2016    Resolved Hospital Problems   Diagnosis Date Noted Date Resolved  . Atrial fibrillation ([place]) 11/02/2017 03/29/2018    Discharge Condition: stable  Diet recommendation: regular diet  Filed Weights   03/29/18 0110 03/29/18 1111  Weight: 71.2 kg (157 lb) 71.2 kg (157 lb)    History of present illness: (per admitting MD Dr Lorin Mercy) PCP: Janie Morning, DO Consultants:  Gwenlyn Found - cardiology Patient coming from:  Home - lives with family; NOK: Daughter, [phone]  Chief Complaint: abdominal pain  HPI: Raven Gray is a 84 y.o. female with medical history significant of afib not on AC; tachybradycardia syndrome status post pacemaker, CVA, hypertension, hyperlipidemia, history of VT,and COPD on prn home O2 presenting with abdominal pain.  Symptoms started last night about 9pm.  She thought she ate too much.  She felt like she was refluxing.  Family gave her Tums, Pepto.  Nothing helped.  About midnight, they called 911.  Pain started in her LUQ but now it extends all the way across her upper abdomen and into her back.    ED Course:  Abdominal pain, n/v.  Afib with runs of NSVT, given  Amio.  +Leukocytosis, lactic acidosis.  Cholecystitis with sepsis - given Zosyn.  Dr. Ninfa Linden consulting from surgery.    Hospital Course:  Principal Problem:   Acute cholecystitis Active Problems:   COPD (chronic obstructive pulmonary disease) (HCC)   Essential hypertension   Chronic atrial fibrillation (H[facility])   Sepsis (H[facility])   HLD (hyperlipidemia)  status post laparoscopic cholecystectomy with possible acute cholecystitis She presented with ab pain, leukocytosis, lactic acidosis  she is treated with zosyn as well, leukocytosis normalized, she is discharged on augmentin.  Surgical Pathology:  INVASIVE ADENOCARCINOMA, WELL DIFFERENTIATED THE SURGICAL RESECTION MARGINS ARE NEGATIVE FOR ADENOCARCINOMA   per general surgery  Dr Lucia Gaskins "There was no suspicion of her having gall bladder cancer at the time of surgery. I saw no intraperitoneal implants or liver nodules. There are photos of the right lobe of the liver at the end of my op note. The CT scan showed nothing suspicious for cancer. I have ordered CEA and CA19-9 levels for a baseline."  I have discussed the case with oncology Dr Benay Spice who will arrange Outpatient follow up in 2-3 weeks.    chronic atrial fibrillation not on anticoagulation due to falls. h/o tachy-brady syndrome s/p  pacemaker H/o diastolic chf, stable, home dose lasix 40mg  daily held in the hospital, resumed at discharge, she is to follow up with cardiology.  hypertension on Lopressor.  COPD stable patient has oxygen at home on a as needed basis.  Depression/anxiety: stable on home meds  FTT: SNF placement   Code Status: DNR  Family Communication: patient  Disposition Plan: SNF 7/11   Consultants:  General surgery  Procedures:  status post laparoscopic cholecystectomy  Antibiotics:  zosyn   Discharge Exam: BP (!) 164/87 (BP Location: Left Arm)   Pulse (!) 59   Temp 97.6 F (36.4 C) (Oral)   Resp 18   Ht 5\' 7"   (1.702 m)   Wt 71.2 kg (157 lb)   SpO2 100%   BMI 24.59 kg/m   General: NAD Cardiovascular: IRRR Respiratory: CTABL  Discharge Instructions You were cared for by a hospitalist during your hospital stay. If you have any questions about your discharge medications or the care you received while you were in the hospital after you are discharged, you can call the unit and asked to speak with the hospitalist on call if the hospitalist that took care of you is not available. Once you are discharged, your primary care physician will handle any further medical issues. Please note that NO REFILLS for any discharge medications will be authorized once you are discharged, as it is imperative that you return to your primary care physician (or establish a relationship with a primary care physician if you do not have one) for your aftercare needs so that they can reassess your need for medications and monitor your lab values.  Discharge Instructions    Diet general   Complete by:  As directed    Increase activity slowly   Complete by:  As directed      Allergies as of 04/01/2018   No Known Allergies     Medication List    STOP taking these medications   amoxicillin-clavulanate 875-125 MG tablet Commonly known as:  AUGMENTIN Replaced by:  amoxicillin-clavulanate 500-125 MG tablet     TAKE these medications   acetaminophen 325 MG tablet Commonly known as:  TYLENOL Take 650 mg by mouth every 6 (six) hours as needed (for pain).   ALPRAZolam 0.25 MG tablet Commonly known as:  XANAX Take 1 tablet (0.25 mg total) by mouth 3 (three) times daily as needed for anxiety (anxiety).   amoxicillin-clavulanate 500-125 MG tablet Commonly known as:  AUGMENTIN Take 1 tablet (500 mg total) by mouth 2 (two) times daily for 3 days. Replaces:  amoxicillin-clavulanate 875-125 MG tablet   furosemide 40 MG tablet Commonly known as:  LASIX Take 1 tablet (40 mg total) by mouth daily. Take 1 tablet (40 mg) two  times a day for 3 days. Then 1 tablet by mouth daily. What changed:  additional instructions   MAGNESIUM PO Take 1 tablet by mouth daily.   MELATONIN PO Take 1 tablet by mouth at bedtime.   metoprolol tartrate 25 MG tablet Commonly known as:  LOPRESSOR Take 1 tablet (25 mg total) by mouth daily.   montelukast 10 MG tablet Commonly known as:  SINGULAIR Take 10 mg by mouth at bedtime.   nitroGLYCERIN 0.4 MG SL tablet Commonly known as:  NITROSTAT Place 0.4 mg under the tongue every 5 (five) minutes as needed for chest pain. X 3 doses   omeprazole 20 MG capsule Commonly known as:  PRILOSEC Take 20 mg by mouth daily as needed (for reflux/heartburn).   OXYGEN Inhale 2 L into the lungs daily as needed (oxygen).   potassium chloride SA 20 MEQ tablet Commonly known as:  K-DUR,KLOR-CON Take 1 tablet (20 mEq total) by mouth daily as needed (along with lasix.). What changed:  when to take this   pravastatin 40 MG tablet Commonly known as:  PRAVACHOL Take 40 mg by  mouth every morning.   traZODone 50 MG tablet Commonly known as:  DESYREL Take 1 tablet by mouth at bedtime.   venlafaxine XR 150 MG 24 hr capsule Commonly known as:  EFFEXOR-XR Take 225 mg by mouth daily.   venlafaxine XR 75 MG 24 hr capsule Commonly known as:  EFFEXOR-XR Take 225 mg by mouth daily.   WOMENS 50+ MULTI VITAMIN/MIN Tabs Take 1 tablet by mouth daily.      No Known Allergies Follow-up Information    Alphonsa Overall, MD. Go on 04/16/2018.   Specialty:  General Surgery Why:  Your appointment is 04/16/18 11:15AM Please arrive 30 minutes prior to your appointment to check in and fill out paperwork. Bring photo ID and insurance information. Contact information: [place] [address] [phone]        [facility] Follow up in 3 week(s).   Specialty:  Oncology Contact information: [address] [id number] [place] [address] [place], DO Follow up.   Specialty:  Family Medicine Contact information: [place] [address] [phone]        Lorretta Harp, MD .   Specialties:  Cardiology, Radiology Contact information: [address] [place] [address] [phone]            The results of significant diagnostics from this hospitalization (including imaging, microbiology, ancillary and laboratory) are listed below for reference.    Significant Diagnostic Studies: Dg Cholangiogram Operative  Result Date: 03/30/2018 CLINICAL DATA:  Gallstones EXAM: INTRAOPERATIVE CHOLANGIOGRAM TECHNIQUE: Cholangiographic images from the C-arm fluoroscopic device were submitted for interpretation post-operatively. Please see the procedural report for the amount of contrast and the fluoroscopy time utilized. COMPARISON:  None. FINDINGS: Contrast fills the cystic duct and intrahepatic biliary tree. The common bile duct and duodenum fail to opacify. IMPRESSION: Findings are consistent with common bile duct obstruction. Electronically Signed   By: Marybelle Killings M.D.   On: 03/30/2018 07:34   Ct Angio Abd/pel W And/or Wo Contrast  Result Date: 03/29/2018 CLINICAL DATA:  84 year old with abdominal pain and clinical concern for mesenteric ischemia. EXAM: CTA ABDOMEN AND PELVIS wITHOUT AND WITH CONTRAST TECHNIQUE: Multidetector CT imaging of the abdomen and pelvis was performed using the standard protocol during bolus administration of intravenous contrast. Multiplanar reconstructed images and MIPs were obtained and reviewed to evaluate the vascular anatomy. CONTRAST:  130mL ISOVUE-370 IOPAMIDOL (ISOVUE-370) INJECTION 76% COMPARISON:  None. FINDINGS: VASCULAR Aorta: Tortuous distal thoracic and abdominal aorta. No dissection. Ectatic infrarenal aorta maximal dimension 2.8 cm. Moderate calcified plaque throughout. 13 mm low-density structure medially  to the right of the aorta at the diaphragmatic hiatus may be partially thrombosed aneurysm or lymphatic malformation, appears similar to recent lumbar spine CTs. Celiac: Mild plaque at the origin without significant stenosis. No dissection. SMA: Mild plaque at the origin without significant stenosis. No dissection. Distal branches are patent. Renals: Accessory left upper pole renal artery. No dissection or significant stenosis. IMA: Patent.  No aneurysm. Inflow: Tortuous without evidence of aneurysm or dissection. Calcified plaque without significant stenosis. Proximal Outflow: Bilateral common femoral and visualized portions of the superficial and profunda femoral arteries are patent without evidence of aneurysm, dissection, vasculitis or significant stenosis. Veins: Portal and mesenteric veins are patent without filling defect. No obvious venous abnormality. Review of the MIP images confirms the above findings. NON-VASCULAR Lower chest: Multi chamber  cardiomegaly with pacemaker partially included. There are coronary artery calcifications. Large hiatal hernia with greater than 50% of the stomach being intrathoracic. Hepatobiliary: Subcentimeter hypodensity in the left lobe of the liver is too small to characterize but likely cyst. Suspect small low-density lesions in the right hepatic dome partially obscured by adjacent streak artifact. Small subcapsular cyst in the right lobe. 2.6 cm gallstone in the gallbladder. Common bile duct is dilated measuring 11 mm distally, no visualized choledocholithiasis. Pancreas: Parenchymal atrophy. No ductal dilatation or inflammation. Spleen: Normal in size without focal abnormality. Adrenals/Urinary Tract: Mild adrenal thickening without dominant nodule. No hydronephrosis or perinephric edema. Multiple right renal cysts including parapelvic cyst in the lower right kidney. Urinary bladder is partially distended without wall thickening. Stomach/Bowel: Large hiatal hernia with  greater than 50% of the stomach being intrathoracic, both intrathoracic and intra-abdominal stomach is distended with ingested contents with air-fluid level. There is no small bowel dilatation, wall thickening or inflammatory change. No bowel obstruction. Mobile cecum located in the right central abdomen. Appendix not confidently visualized. Moderate colonic stool burden. Mild diverticulosis of the distal colon without diverticulitis. Lymphatic: No enlarged abdominal or pelvic lymph nodes. Reproductive: Uterus not well visualized, atrophic versus surgically absent. No adnexal mass. Other: No free air or ascites. Tiny fat containing umbilical hernia. Musculoskeletal: Scoliosis and multilevel degenerative change throughout spine. Vertebral body hemangioma within T11. Surgical hardware posterior L4-L5. IMPRESSION: VASCULAR 1. No acute vascular abnormality. Patent mesenteric vessels without secondary findings of mesenteric ischemia. 2. Aortic atherosclerosis and tortuosity ( Aortic Atherosclerosis (ICD10-I70.0).) Ectatic abdominal aorta at risk for aneurysm development. Recommend followup by ultrasound in 5 years. This recommendation follows ACR consensus guidelines: White Paper of the ACR Incidental Findings Committee II on Vascular Findings. J Am Coll Radiol 2013; 10:789-794. 3. Multi chamber cardiomegaly. NON-VASCULAR 1. Gallstone in the gallbladder without CT findings of gallbladder inflammation. Mild biliary dilatation without visualized choledocholithiasis. 2. Large hiatal hernia, greater than 50% of the stomach is intrathoracic. Intrathoracic and intra-abdominal stomach are distended with ingested contents containing air-fluid level suggesting slow transit. 3. Colonic diverticulosis without diverticulitis. Electronically Signed   By: Jeb Levering M.D.   On: 03/29/2018 04:42   US Abdomen Limited Ruq  Result Date: 03/29/2018 CLINICAL DATA:  Initial evaluation for acute abdominal pain. EXAM: ULTRASOUND  ABDOMEN LIMITED RIGHT UPPER QUADRANT COMPARISON:  Prior radiograph from 03/29/2018 FINDINGS: Gallbladder: 2.6 cm shadowing echogenic stone present within the gallbladder lumen. Gallbladder wall thickened up to 4 mm. No sonographic Murphy sign elicited on exam. No free pericholecystic fluid. Common bile duct: Diameter: 7.2 mm, likely within normal limits for age. Liver: No focal lesion identified. Within normal limits in parenchymal echogenicity. Portal vein is patent on color Doppler imaging with normal direction of blood flow towards the liver. IMPRESSION: 1. Cholelithiasis with mild gallbladder wall thickening. Clinical correlation for possible acute cholecystitis recommended. 2. No biliary dilatation. Electronically Signed   By: Jeannine Boga M.D.   On: 03/29/2018 05:00    Microbiology: Recent Results (from the past 240 hour(s))  Urine culture     Status: None   Collection Time: 03/29/18  4:51 AM  Result Value Ref Range Status   Specimen Description URINE, CATHETERIZED  Final   Special Requests NONE  Final   Culture   Final    NO GROWTH Performed at [facility], [address], [place], [place] [address]    Report Status 03/31/2018 FINAL  Final  Blood culture (routine x 2)  Status: None (Preliminary result)   Collection Time: 03/29/18  6:13 AM  Result Value Ref Range Status   Specimen Description BLOOD RIGHT WRIST  Final   Special Requests   Final    BOTTLES DRAWN AEROBIC AND ANAEROBIC Blood Culture adequate volume   Culture   Final    NO GROWTH 3 DAYS Performed at [facility], [address], [place], [place] [address]    Report Status PENDING  Incomplete  Blood culture (routine x 2)     Status: None (Preliminary result)   Collection Time: 03/29/18  7:10 AM  Result Value Ref Range Status   Specimen Description BLOOD RIGHT HAND  Final   Special Requests   Final    BOTTLES DRAWN AEROBIC AND ANAEROBIC Blood Culture adequate volume   Culture   Final    NO  GROWTH 3 DAYS Performed at [facility], [place] [address], [place], [place] [address]    Report Status PENDING  Incomplete  Surgical pcr screen     Status: None   Collection Time: 03/29/18  1:22 PM  Result Value Ref Range Status   MRSA, PCR NEGATIVE NEGATIVE Final   Staphylococcus aureus NEGATIVE NEGATIVE Final    Comment: (NOTE) The Xpert SA Assay (FDA approved for NASAL specimens in patients 42 years of age and older), is one component of a comprehensive surveillance program. It is not intended to diagnose infection nor to guide or monitor treatment. Performed at [facility], [place] [address], [place], [place] [address]      Labs: Basic Metabolic Panel: Recent Labs  Lab 03/29/18 0131 03/29/18 0144 03/30/18 0509 03/31/18 0511 04/01/18 0752  NA 142 139 142 139 143  K 4.0 3.8 4.5 4.2 3.6  CL 102 99 103 100 103  CO2 30  --  30 29 32  GLUCOSE 174* 167* 139* 96 96  BUN 22 25* 19 23 15   CREATININE 1.00 0.90 1.06* 1.10* 0.91  CALCIUM 9.2  --  8.9 8.3* 8.3*  MG 2.0  --   --   --  1.7   Liver Function Tests: Recent Labs  Lab 03/29/18 0131 03/30/18 0509  AST 22 29  ALT 21 24  ALKPHOS 86 73  BILITOT 0.8 1.0  PROT 6.7 5.8*  ALBUMIN 3.5 3.0*   No results for input(s): LIPASE, AMYLASE in the last 168 hours. No results for input(s): AMMONIA in the last 168 hours. CBC: Recent Labs  Lab 03/29/18 0131 03/29/18 0144 03/30/18 0509 03/31/18 0511 04/01/18 0752  WBC 12.7*  --  14.7* 12.0* 9.3  HGB 14.4 16.7* 13.5 11.8* 13.0  HCT 48.8* 49.0* 46.2* 40.7 44.1  MCV 85.9  --  86.0 87.2 85.6  PLT 215  --  173 148* 148*   Cardiac Enzymes: No results for input(s): CKTOTAL, CKMB, CKMBINDEX, TROPONINI in the last 168 hours. BNP: BNP (last 3 results) Recent Labs    07/01/17 0825 11/06/17 0946 03/29/18 0131  BNP 555.2* 450.1* 811.5*    ProBNP (last 3 results) No results for input(s): PROBNP in the last 8760 hours.  CBG: No results for input(s): GLUCAP in the  last 168 hours.     Signed:  Florencia Reasons MD, PhD  [facility] 04/01/2018, 10:56 AM

## 2018-04-01 NOTE — Progress Notes (Signed)
Pasrr: [phone] Sharrell Ku Darel Ricketts LCSW [phone]

## 2018-04-01 NOTE — Clinical Social Work Placement (Signed)
   CLINICAL SOCIAL WORK PLACEMENT  NOTE  Date:  04/01/2018  Patient Details  Name: Raven Gray MRN: [id number] Date of Birth: [birth year]  Clinical Social Work is seeking post-discharge placement for this patient at the [place] level of care (*CSW will initial, date and re-position this form in  chart as items are completed):  Yes   Patient/family provided with [place] Work Department's list of facilities offering this level of care within the geographic area requested by the patient (or if unable, by the patient's family).  Yes   Patient/family informed of their freedom to choose among providers that offer the needed level of care, that participate in Medicare, Medicaid or managed care program needed by the patient, have an available bed and are willing to accept the patient.  Yes   Patient/family informed of [facility]'s ownership interest in [facility] and [facility], as well as of the fact that they are under no obligation to receive care at these facilities.  PASRR submitted to EDS on 03/30/18     PASRR number received on 04/01/18     Existing PASRR number confirmed on       FL2 transmitted to all facilities in geographic area requested by pt/family on 03/30/18     FL2 transmitted to all facilities within larger geographic area on       Patient informed that his/her managed care company has contracts with or will negotiate with certain facilities, including the following:        Yes   Patient/family informed of bed offers received.  Patient chooses bed at [place], [place]     Physician recommends and patient chooses bed at      Patient to be transferred to [place], [place] on 04/01/18.  Patient to be transferred to facility by Daughter by car      Patient family notified on 04/01/18 of transfer.  Name of family member notified:  [facility]     PHYSICIAN       Additional Comment:     _______________________________________________ Vinie Sill, [place] 04/01/2018, 11:32 AM

## 2018-04-02 LAB — CANCER ANTIGEN 19-9: CA 19-9: 17 U/mL (ref 0–35)

## 2018-04-02 LAB — CEA: CEA1: 3.8 ng/mL (ref 0.0–4.7)

## 2018-04-03 LAB — CULTURE, BLOOD (ROUTINE X 2)
CULTURE: NO GROWTH
Culture: NO GROWTH
Special Requests: ADEQUATE
Special Requests: ADEQUATE

## 2018-04-03 NOTE — ED Notes (Signed)
1st set of blood cultures collected  Zosyn started immediately afer the blood cultures were drawn

## 2018-04-04 DIAGNOSIS — C249 Malignant neoplasm of biliary tract, unspecified: Secondary | ICD-10-CM | POA: Diagnosis not present

## 2018-04-04 DIAGNOSIS — R2689 Other abnormalities of gait and mobility: Secondary | ICD-10-CM | POA: Diagnosis not present

## 2018-04-04 DIAGNOSIS — I482 Chronic atrial fibrillation: Secondary | ICD-10-CM | POA: Diagnosis not present

## 2018-04-04 DIAGNOSIS — A4189 Other specified sepsis: Secondary | ICD-10-CM | POA: Diagnosis not present

## 2018-04-04 DIAGNOSIS — J449 Chronic obstructive pulmonary disease, unspecified: Secondary | ICD-10-CM | POA: Diagnosis not present

## 2018-04-06 ENCOUNTER — Telehealth: Payer: Self-pay | Admitting: Oncology

## 2018-04-06 NOTE — Telephone Encounter (Signed)
Spoke to the pt's daughter Ms. Aretha Parrot who will callback to schedule. I provided Mrs. Landress my direct phone number.

## 2018-04-07 ENCOUNTER — Telehealth: Payer: Self-pay

## 2018-04-07 NOTE — Telephone Encounter (Signed)
Called daughter to see if they were still interested in finding mother nursing facility. No answer will call back

## 2018-04-14 ENCOUNTER — Telehealth: Payer: Self-pay | Admitting: Hematology

## 2018-04-14 NOTE — Progress Notes (Signed)
Daughter called our new patient scheduler requesting an apt for her mother. We had not received a referral from surgeon. I was able to read surgeon's note from [facility] and plan is for med/onc referral. Isaiah Blakes will call patient to schedule appointment.

## 2018-04-14 NOTE — Telephone Encounter (Signed)
New referral received from Dr. Lucia Gaskins at [facility] for adenocarcinoma of the gallbladder. Spoke to the pt's daughter, [name], and scheduled the pt to see Dr. Burr Medico on 7/29 at 230pm. Aware to have her mother arrive 30 minutes early. Voiced understanding.

## 2018-04-15 DIAGNOSIS — M1990 Unspecified osteoarthritis, unspecified site: Secondary | ICD-10-CM | POA: Diagnosis not present

## 2018-04-15 DIAGNOSIS — I509 Heart failure, unspecified: Secondary | ICD-10-CM | POA: Diagnosis not present

## 2018-04-15 DIAGNOSIS — I110 Hypertensive heart disease with heart failure: Secondary | ICD-10-CM | POA: Diagnosis not present

## 2018-04-15 DIAGNOSIS — I480 Paroxysmal atrial fibrillation: Secondary | ICD-10-CM | POA: Diagnosis not present

## 2018-04-18 DIAGNOSIS — C23 Malignant neoplasm of gallbladder: Secondary | ICD-10-CM | POA: Insufficient documentation

## 2018-04-19 ENCOUNTER — Encounter: Payer: Self-pay | Admitting: Hematology

## 2018-04-19 ENCOUNTER — Ambulatory Visit (INDEPENDENT_AMBULATORY_CARE_PROVIDER_SITE_OTHER): Payer: Medicare Other | Admitting: *Deleted

## 2018-04-19 ENCOUNTER — Inpatient Hospital Stay: Payer: Medicare Other | Attending: Hematology | Admitting: Hematology

## 2018-04-19 ENCOUNTER — Telehealth: Payer: Self-pay | Admitting: Hematology

## 2018-04-19 DIAGNOSIS — K5730 Diverticulosis of large intestine without perforation or abscess without bleeding: Secondary | ICD-10-CM | POA: Diagnosis not present

## 2018-04-19 DIAGNOSIS — Z86718 Personal history of other venous thrombosis and embolism: Secondary | ICD-10-CM | POA: Diagnosis not present

## 2018-04-19 DIAGNOSIS — I700 Atherosclerosis of aorta: Secondary | ICD-10-CM | POA: Insufficient documentation

## 2018-04-19 DIAGNOSIS — E785 Hyperlipidemia, unspecified: Secondary | ICD-10-CM | POA: Insufficient documentation

## 2018-04-19 DIAGNOSIS — I495 Sick sinus syndrome: Secondary | ICD-10-CM | POA: Diagnosis not present

## 2018-04-19 DIAGNOSIS — I517 Cardiomegaly: Secondary | ICD-10-CM | POA: Diagnosis not present

## 2018-04-19 DIAGNOSIS — I509 Heart failure, unspecified: Secondary | ICD-10-CM | POA: Insufficient documentation

## 2018-04-19 DIAGNOSIS — Z8673 Personal history of transient ischemic attack (TIA), and cerebral infarction without residual deficits: Secondary | ICD-10-CM | POA: Diagnosis not present

## 2018-04-19 DIAGNOSIS — Z79899 Other long term (current) drug therapy: Secondary | ICD-10-CM | POA: Insufficient documentation

## 2018-04-19 DIAGNOSIS — J449 Chronic obstructive pulmonary disease, unspecified: Secondary | ICD-10-CM | POA: Diagnosis not present

## 2018-04-19 DIAGNOSIS — K449 Diaphragmatic hernia without obstruction or gangrene: Secondary | ICD-10-CM | POA: Diagnosis not present

## 2018-04-19 DIAGNOSIS — F0390 Unspecified dementia without behavioral disturbance: Secondary | ICD-10-CM | POA: Diagnosis not present

## 2018-04-19 DIAGNOSIS — F418 Other specified anxiety disorders: Secondary | ICD-10-CM | POA: Insufficient documentation

## 2018-04-19 DIAGNOSIS — I4891 Unspecified atrial fibrillation: Secondary | ICD-10-CM | POA: Insufficient documentation

## 2018-04-19 DIAGNOSIS — C23 Malignant neoplasm of gallbladder: Secondary | ICD-10-CM | POA: Diagnosis not present

## 2018-04-19 DIAGNOSIS — I959 Hypotension, unspecified: Secondary | ICD-10-CM | POA: Diagnosis not present

## 2018-04-19 DIAGNOSIS — Z9049 Acquired absence of other specified parts of digestive tract: Secondary | ICD-10-CM | POA: Diagnosis not present

## 2018-04-19 DIAGNOSIS — E876 Hypokalemia: Secondary | ICD-10-CM | POA: Insufficient documentation

## 2018-04-19 DIAGNOSIS — I110 Hypertensive heart disease with heart failure: Secondary | ICD-10-CM | POA: Insufficient documentation

## 2018-04-19 DIAGNOSIS — E786 Lipoprotein deficiency: Secondary | ICD-10-CM

## 2018-04-19 NOTE — Progress Notes (Signed)
Remote pacemaker transmission.   

## 2018-04-19 NOTE — Progress Notes (Signed)
[place]  [phone] [phone] Secretary Note   Patient Care Team: Janie Morning, DO as PCP - General (Family Medicine) Lorretta Harp, MD as PCP - Cardiology (Cardiology)   Date of Service:  04/19/2018  CHIEF COMPLAINTS/PURPOSE OF CONSULTATION:  Recently diagnosed Primary gall bladder adenocarcinoma   Referring Physician:  Dr. Lucia Gaskins     Primary gall bladder adenocarcinoma [facility])   03/29/2018 Cancer Staging    Staging form: Gallbladder, AJCC 8th Edition - Pathologic stage from 03/29/2018: Stage Unknown (pT2b, pNX, cM0) - Signed by Truitt Merle, MD on 04/18/2018      03/29/2018 Imaging    CT Angio AP 03/29/18 IMPRESSION: VASCULAR 1. No acute vascular abnormality. Patent mesenteric vessels without secondary findings of mesenteric ischemia. 2. Aortic atherosclerosis and tortuosity ( Aortic Atherosclerosis (ICD10-I70.0).) Ectatic abdominal aorta at risk for aneurysm development. Recommend followup by ultrasound in 5 years. This recommendation follows ACR consensus guidelines: White Paper of the ACR Incidental Findings Committee II on Vascular Findings. J Am Coll Radiol 2013; 10:789-794. 3. Multi chamber cardiomegaly. NON-VASCULAR 1. Gallstone in the gallbladder without CT findings of gallbladder inflammation. Mild biliary dilatation without visualized choledocholithiasis. 2. Large hiatal hernia, greater than 50% of the stomach is intrathoracic. Intrathoracic and intra-abdominal stomach are distended with ingested contents containing air-fluid level suggesting slow transit. 3. Colonic diverticulosis without diverticulitis.       03/29/2018 Imaging    US Abdomen RUQ 03/29/18  IMPRESSION: 1. Cholelithiasis with mild gallbladder wall thickening. Clinical correlation for possible acute cholecystitis recommended. 2. No biliary dilatation.      03/29/2018 Surgery    LAPAROSCOPIC CHOLECYSTECTOMY WITH INTRAOPERATIVE CHOLANGIOGRAM by  Dr. Lucia Gaskins  03/29/18      03/29/2018 Initial Biopsy    Diagnosis 03/29/18  Gallbladder - INVASIVE ADENOCARCINOMA, WELL DIFFERENTIATED, MULTIPLE FOCI, THE LARGEST SPANS 4.9 CM. - ADENOCARCINOMA EXTENDS THROUGH THE MUSCULARIS PROPRIA. - THE SURGICAL RESECTION MARGINS ARE NEGATIVE FOR ADENOCARCINOMA. - SEE ONCOLOGY TABLE BELOW.      04/18/2018 Initial Diagnosis    Primary gall bladder adenocarcinoma (HCC)       HISTORY OF PRESENTING ILLNESS:   Raven Gray 84 y.o. female is a here because of recently diagnosed Primary gall bladder adenocarcinoma. The patient was referred by Dr. Lucia Gaskins. The patient presents to the clinic today accompanied by her daughters and speak as her historians. I spoke with one of her daughters about diagnosis before meeting with patient.   She notes the night she went to hospital on 03/29/18 she went out to eat for her [employer] and she started to burp which she related to her acid reflux. She took antiacids with no relief. She then progressed to abdominal pain and went to the ED. Workup showed she had abnormal gallbladder. She had cholecystectomy same day and surgery and pathology shows cancer.   Today her family notes her weight has been trending down and since surgery she has mild b/l ankle swelling. Her BP is low at 81/86. She has been on Lasix BID. She did eat breakfast and lunch today. Pt denied lightheadedness or dizziness. She has been eating moderately well.   Socially prior to surgery she was living by herself with help from her family. She would use wheelchair when outside and walker when at home. She moves very little at home. She went to [place] home for rehab and is now living with her family.   She has PMHx of CHF and COPD. She has been on [place]  oxygen 2L for 1-2 years now. She also has Afib, H.o PE and Stroke. She has HTN and tachycardia.She use to be on Xarelto but taken off due to fall risk. She has pacemaker due to irregular beats. No family history of  cancer. She is on Xanax as needed for anxiety.     MEDICAL HISTORY:  Past Medical History:  Diagnosis Date  . Atrial fibrillation ([place])    off AC due to falls  . Chest pain   . CHF (congestive heart failure) ([place])   . Cholecystitis 03/2018  . COPD (chronic obstructive pulmonary disease) (HCC)    wears prn home O2  . Dementia   . H/O echocardiogram    a. 08/2016: EF of 60-65%, severely dilated LA, mild pulmonic regurgitations, mild TR, and PA Pressure at 38 mm Hg  . History of depression   . History of pulmonary embolism   . Hyperlipidemia   . Hypertension   . Hypokalemia    Now improved with treatment  . Pre-diabetes   . Shortness of breath   . Stroke [facility])    brainstem aneurysm(?) in 2016  . Tachy-brady syndrome ([place]) 04/01/2017   s/p MDT PPM     SURGICAL HISTORY: Past Surgical History:  Procedure Laterality Date  . ABDOMINAL HYSTERECTOMY    . BACK SURGERY    . CHOLECYSTECTOMY N/A 03/29/2018   Procedure: LAPAROSCOPIC CHOLECYSTECTOMY WITH INTRAOPERATIVE CHOLANGIOGRAM;  Surgeon: Alphonsa Overall, MD;  Location: [place];  Service: General;  Laterality: N/A;  . PACEMAKER IMPLANT N/A 04/01/2017   Procedure: Pacemaker Implant;  Surgeon: Sanda Klein, MD;  Location: [place] CV LAB;  Service: Cardiovascular;  Laterality: N/A; MDT Azure XT SR MRI  . REPLACEMENT TOTAL KNEE    . ROTATOR CUFF REPAIR      SOCIAL HISTORY: Social History   Socioeconomic History  . Marital status: Widowed    Spouse name: Not on file  . Number of children: Not on file  . Years of education: Not on file  . Highest education level: Not on file  Occupational History  . Not on file  Social Needs  . Financial resource strain: Not on file  . Food insecurity:    Worry: Not on file    Inability: Not on file  . Transportation needs:    Medical: Not on file    Non-medical: Not on file  Tobacco Use  . Smoking status: Never Smoker  . Smokeless tobacco: Never Used  Substance and Sexual Activity    . Alcohol use: No  . Drug use: No  . Sexual activity: Not Currently  Lifestyle  . Physical activity:    Days per week: Not on file    Minutes per session: Not on file  . Stress: Not on file  Relationships  . Social connections:    Talks on phone: Not on file    Gets together: Not on file    Attends religious service: Not on file    Active member of club or organization: Not on file    Attends meetings of clubs or organizations: Not on file    Relationship status: Not on file  . Intimate partner violence:    Fear of current or ex partner: Not on file    Emotionally abused: Not on file    Physically abused: Not on file    Forced sexual activity: Not on file  Other Topics Concern  . Not on file  Social History Narrative  . Not on file  FAMILY HISTORY: Family History  Problem Relation Age of Onset  . Alzheimer's disease Mother   . Heart attack Father   . Diabetes type II Daughter   . Heart disease Daughter        stents    ALLERGIES:  has No Known Allergies.  MEDICATIONS:  Current Outpatient Medications  Medication Sig Dispense Refill  . acetaminophen (TYLENOL) 325 MG tablet Take 650 mg by mouth every 6 (six) hours as needed (for pain).     Marland Kitchen ALPRAZolam (XANAX) 0.25 MG tablet Take 1 tablet (0.25 mg total) by mouth 3 (three) times daily as needed for anxiety (anxiety). 5 tablet 0  . furosemide (LASIX) 40 MG tablet Take 1 tablet (40 mg total) by mouth daily. Take 1 tablet (40 mg) two times a day for 3 days. Then 1 tablet by mouth daily. (Patient taking differently: Take 20 mg by mouth daily. Take 1 tablet (40 mg) two times a day for 3 days. Then 1 tablet by mouth daily.) 30 tablet 0  . MAGNESIUM PO Take 1 tablet by mouth daily.     Marland Kitchen MELATONIN PO Take 1 tablet by mouth at bedtime.     . metoprolol tartrate (LOPRESSOR) 25 MG tablet Take 1 tablet (25 mg total) by mouth daily. 90 tablet 3  . montelukast (SINGULAIR) 10 MG tablet Take 10 mg by mouth at bedtime.    . Multiple  Vitamins-Minerals (WOMENS 50+ MULTI VITAMIN/MIN) TABS Take 1 tablet by mouth daily.    . nitroGLYCERIN (NITROSTAT) 0.4 MG SL tablet Place 0.4 mg under the tongue every 5 (five) minutes as needed for chest pain. X 3 doses  0  . omeprazole (PRILOSEC) 20 MG capsule Take 20 mg by mouth daily as needed (for reflux/heartburn).   11  . OXYGEN Inhale 2 L into the lungs daily as needed (oxygen).     . potassium chloride SA (K-DUR,KLOR-CON) 20 MEQ tablet Take 1 tablet (20 mEq total) by mouth daily as needed (along with lasix.). (Patient taking differently: Take 20 mEq by mouth daily. ) 30 tablet 0  . pravastatin (PRAVACHOL) 40 MG tablet Take 40 mg by mouth every morning.     . traZODone (DESYREL) 50 MG tablet Take 1 tablet by mouth at bedtime.  1  . venlafaxine XR (EFFEXOR-XR) 150 MG 24 hr capsule Take 225 mg by mouth daily.     Marland Kitchen venlafaxine XR (EFFEXOR-XR) 75 MG 24 hr capsule Take 225 mg by mouth daily.      No current facility-administered medications for this visit.     REVIEW OF SYSTEMS:   Constitutional: Denies fevers, chills or abnormal night sweats (+) low appetite (+) weight trending down Eyes: Denies blurriness of vision, double vision or watery eyes Ears, nose, mouth, throat, and face: Denies mucositis or sore throat Respiratory: Denies cough, dyspnea or wheezes (+) COPD, On 2L [place] oxygen  Cardiovascular: Denies palpitation, chest discomfort (+) Mild b/l ankle swelling Gastrointestinal:  Denies nausea, heartburn or change in bowel habits (+) occasional upset stomach  Skin: Denies abnormal skin rashes Lymphatics: Denies new lymphadenopathy or easy bruising MSK: (+) Ambulates with walker or wheelchair  Neurological:Denies numbness, tingling or new weaknesses Behavioral/Psych: Mood is stable, no new changes  All other systems were reviewed with the patient and are negative.  PHYSICAL EXAMINATION: ECOG PERFORMANCE STATUS: 3 - Symptomatic, >50% confined to bed  Vitals:   04/19/18 1447  04/19/18 1547  BP: (!) 81/68 (!) 90/58  Pulse: 61 64  Resp: 14  Temp: (!) 97.5 F (36.4 C)   SpO2: 100%    Filed Weights   04/19/18 1447  Weight: 150 lb 11.2 oz (68.4 kg)    GENERAL:alert, no distress and comfortable SKIN: skin color, texture, turgor are normal, no rashes or significant lesions EYES: normal, conjunctiva are pink and non-injected, sclera clear OROPHARYNX:no exudate, no erythema and lips, buccal mucosa, and tongue normal  NECK: supple, thyroid normal size, non-tender, without nodularity LYMPH:  no palpable lymphadenopathy in the cervical, axillary or inguinal LUNGS: clear to auscultation and percussion with normal breathing effort HEART: regular rate & rhythm and no murmurs and no lower extremity edema (+) pacemaker in place (+) mild tachycardia  ABDOMEN:abdomen soft, non-tender and normal bowel sounds (+) s/p cholecystectomy: surgical incisions healing well  Musculoskeletal:no cyanosis of digits and no clubbing  PSYCH: alert & oriented x 3 with fluent speech NEURO: no focal motor/sensory deficits  LABORATORY DATA:  I have reviewed the data as listed CBC Latest Ref Rng & Units 04/01/2018 03/31/2018 03/30/2018  WBC 4.0 - 10.5 K/uL 9.3 12.0(H) 14.7(H)  Hemoglobin 12.0 - 15.0 g/dL 13.0 11.8(L) 13.5  Hematocrit 36.0 - 46.0 % 44.1 40.7 46.2(H)  Platelets 150 - 400 K/uL 148(L) 148(L) 173    CMP Latest Ref Rng & Units 04/01/2018 03/31/2018 03/30/2018  Glucose 70 - 99 mg/dL 96 96 139(H)  BUN 8 - 23 mg/dL 15 23 19   Creatinine 0.44 - 1.00 mg/dL 0.91 1.10(H) 1.06(H)  Sodium 135 - 145 mmol/L 143 139 142  Potassium 3.5 - 5.1 mmol/L 3.6 4.2 4.5  Chloride 98 - 111 mmol/L 103 100 103  CO2 22 - 32 mmol/L 32 29 30  Calcium 8.9 - 10.3 mg/dL 8.3(L) 8.3(L) 8.9  Total Protein 6.5 - 8.1 g/dL - - 5.8(L)  Total Bilirubin 0.3 - 1.2 mg/dL - - 1.0  Alkaline Phos 38 - 126 U/L - - 73  AST 15 - 41 U/L - - 29  ALT 0 - 44 U/L - - 24     PATHOLOGY  Diagnosis 03/29/18  Gallbladder -  INVASIVE ADENOCARCINOMA, WELL DIFFERENTIATED, MULTIPLE FOCI, THE LARGEST SPANS 4.9 CM. - ADENOCARCINOMA EXTENDS THROUGH THE MUSCULARIS PROPRIA. - THE SURGICAL RESECTION MARGINS ARE NEGATIVE FOR ADENOCARCINOMA. - SEE ONCOLOGY TABLE BELOW. Microscopic Comment GALLBLADDER: Procedure: Cholecystectomy Tumor Site: Multiple tumors, largest at fundus Tumor Size: Largest is 4.9 cm Histologic Type: Adenocarcinoma Histologic Grade: Well differentiated Tumor Extension: Involves perimuscular soft tissue Margins: 1.5 cm to cystic duct margin Regional Lymph Nodes: None examined Pathologic Stage Classification (pTNM, AJCC 8th Edition): pT2b, pNX Representative tumor block: 1B Comment(s): Grossly, there are multiple nodules identified, the largest spans 4.9 cm. Histologic evaluation of these nodules reveals similar appearing invasive well differentiated adenocarcinoma extending through the gallbladder muscular layer. The surgical resection margins are negative for carcinoma. Dr. Lucia Gaskins was paged on 03/31/18. (JBK:gt, 03/31/18) Additional comment: Given that the tumor grossly involves the hepatic side of the gallbladder, the tumor is staged as T2b. The case was discussed with Dr. Lucia Gaskins on 04/05/2018. (JBK:ah 04/05/18) (v4.0.0.0)   RADIOGRAPHIC STUDIES: I have personally reviewed the radiological images as listed and agreed with the findings in the report. Dg Cholangiogram Operative  Result Date: 03/30/2018 CLINICAL DATA:  Gallstones EXAM: INTRAOPERATIVE CHOLANGIOGRAM TECHNIQUE: Cholangiographic images from the C-arm fluoroscopic device were submitted for interpretation post-operatively. Please see the procedural report for the amount of contrast and the fluoroscopy time utilized. COMPARISON:  None. FINDINGS: Contrast fills the cystic duct and intrahepatic biliary tree. The common bile  duct and duodenum fail to opacify. IMPRESSION: Findings are consistent with common bile duct obstruction. Electronically  Signed   By: Marybelle Killings M.D.   On: 03/30/2018 07:34   Ct Angio Abd/pel W And/or Wo Contrast  Result Date: 03/29/2018 CLINICAL DATA:  84 year old with abdominal pain and clinical concern for mesenteric ischemia. EXAM: CTA ABDOMEN AND PELVIS wITHOUT AND WITH CONTRAST TECHNIQUE: Multidetector CT imaging of the abdomen and pelvis was performed using the standard protocol during bolus administration of intravenous contrast. Multiplanar reconstructed images and MIPs were obtained and reviewed to evaluate the vascular anatomy. CONTRAST:  173mL ISOVUE-370 IOPAMIDOL (ISOVUE-370) INJECTION 76% COMPARISON:  None. FINDINGS: VASCULAR Aorta: Tortuous distal thoracic and abdominal aorta. No dissection. Ectatic infrarenal aorta maximal dimension 2.8 cm. Moderate calcified plaque throughout. 13 mm low-density structure medially to the right of the aorta at the diaphragmatic hiatus may be partially thrombosed aneurysm or lymphatic malformation, appears similar to recent lumbar spine CTs. Celiac: Mild plaque at the origin without significant stenosis. No dissection. SMA: Mild plaque at the origin without significant stenosis. No dissection. Distal branches are patent. Renals: Accessory left upper pole renal artery. No dissection or significant stenosis. IMA: Patent.  No aneurysm. Inflow: Tortuous without evidence of aneurysm or dissection. Calcified plaque without significant stenosis. Proximal Outflow: Bilateral common femoral and visualized portions of the superficial and profunda femoral arteries are patent without evidence of aneurysm, dissection, vasculitis or significant stenosis. Veins: Portal and mesenteric veins are patent without filling defect. No obvious venous abnormality. Review of the MIP images confirms the above findings. NON-VASCULAR Lower chest: Multi chamber cardiomegaly with pacemaker partially included. There are coronary artery calcifications. Large hiatal hernia with greater than 50% of the stomach being  intrathoracic. Hepatobiliary: Subcentimeter hypodensity in the left lobe of the liver is too small to characterize but likely cyst. Suspect small low-density lesions in the right hepatic dome partially obscured by adjacent streak artifact. Small subcapsular cyst in the right lobe. 2.6 cm gallstone in the gallbladder. Common bile duct is dilated measuring 11 mm distally, no visualized choledocholithiasis. Pancreas: Parenchymal atrophy. No ductal dilatation or inflammation. Spleen: Normal in size without focal abnormality. Adrenals/Urinary Tract: Mild adrenal thickening without dominant nodule. No hydronephrosis or perinephric edema. Multiple right renal cysts including parapelvic cyst in the lower right kidney. Urinary bladder is partially distended without wall thickening. Stomach/Bowel: Large hiatal hernia with greater than 50% of the stomach being intrathoracic, both intrathoracic and intra-abdominal stomach is distended with ingested contents with air-fluid level. There is no small bowel dilatation, wall thickening or inflammatory change. No bowel obstruction. Mobile cecum located in the right central abdomen. Appendix not confidently visualized. Moderate colonic stool burden. Mild diverticulosis of the distal colon without diverticulitis. Lymphatic: No enlarged abdominal or pelvic lymph nodes. Reproductive: Uterus not well visualized, atrophic versus surgically absent. No adnexal mass. Other: No free air or ascites. Tiny fat containing umbilical hernia. Musculoskeletal: Scoliosis and multilevel degenerative change throughout spine. Vertebral body hemangioma within T11. Surgical hardware posterior L4-L5. IMPRESSION: VASCULAR 1. No acute vascular abnormality. Patent mesenteric vessels without secondary findings of mesenteric ischemia. 2. Aortic atherosclerosis and tortuosity ( Aortic Atherosclerosis (ICD10-I70.0).) Ectatic abdominal aorta at risk for aneurysm development. Recommend followup by ultrasound in 5  years. This recommendation follows ACR consensus guidelines: White Paper of the ACR Incidental Findings Committee II on Vascular Findings. J Am Coll Radiol 2013; 10:789-794. 3. Multi chamber cardiomegaly. NON-VASCULAR 1. Gallstone in the gallbladder without CT findings of gallbladder inflammation. Mild biliary dilatation without visualized  choledocholithiasis. 2. Large hiatal hernia, greater than 50% of the stomach is intrathoracic. Intrathoracic and intra-abdominal stomach are distended with ingested contents containing air-fluid level suggesting slow transit. 3. Colonic diverticulosis without diverticulitis. Electronically Signed   By: Jeb Levering M.D.   On: 03/29/2018 04:42   US Abdomen Limited Ruq  Result Date: 03/29/2018 CLINICAL DATA:  Initial evaluation for acute abdominal pain. EXAM: ULTRASOUND ABDOMEN LIMITED RIGHT UPPER QUADRANT COMPARISON:  Prior radiograph from 03/29/2018 FINDINGS: Gallbladder: 2.6 cm shadowing echogenic stone present within the gallbladder lumen. Gallbladder wall thickened up to 4 mm. No sonographic Murphy sign elicited on exam. No free pericholecystic fluid. Common bile duct: Diameter: 7.2 mm, likely within normal limits for age. Liver: No focal lesion identified. Within normal limits in parenchymal echogenicity. Portal vein is patent on color Doppler imaging with normal direction of blood flow towards the liver. IMPRESSION: 1. Cholelithiasis with mild gallbladder wall thickening. Clinical correlation for possible acute cholecystitis recommended. 2. No biliary dilatation. Electronically Signed   By: Jeannine Boga M.D.   On: 03/29/2018 05:00    ASSESSMENT & PLAN:  PRISILLA KOCSIS is a 84 y.o. Caucasian female with a history of AFib, COPD, Dementia, depression, HLD, HTN, Stroke, PE, Hypokalemia, h/o tachy-brady syndrome s/p pacemaker and pre-diabetes.    1. Primary gall bladder adenocarcinoma, pT2NxMx -I reviewed and discussed CT image findings and 03/29/18  cholecystectomy pathology report with patient and her daughters in great detail.  -CT abdomen scan did not show evidence of metastatic disease.  She had a CT chest with contrast in February 2019 to rule out PE, which was negative for thoracic metastasis. -Pathology showed T2 lesions was completely resected with negative margins.  -Her cholecystectomy was done before the diagnosis of gallbladder cancer.  Given her T2 lesion, we discussed the risk of regional lymph node metastasis is high (probably 30-50%), so the standard care will be having second oncological surgery with more lymph nodes removal (at least 12) and possible partial hepatectomy  -Given her advanced age, and significant medical comorbodities, especially COPD, CHF requiring continuous oxygen, very limited performance status, I will discuss with Dr. Barry Dienes and Dr. Lucia Gaskins about the possibility of another surgery. Adjuvant chemotherapy is also standard of care, but I do not recommend given age and overall poor health.  -We discussed cancer recurrence in the future.  I recommend routine follow-up, and consider CT scan every 6 to 12 months.  Given her advanced age and multiple comorbidities, she is probably not a candidate for chemotherapy even if she has recurrence.   -Pt and her daughters are not very enthusiastic about a second surgery, due to her slow recovery from the cholecystectomy.  The quality of life is certainly more important for her. -I encouraged her to work on managing her BP and improving her performance status. I encouraged her to eat and drink more and to continue PT and work on gaining her strength back.  -F/u in 3 months with lab.    2. HTN, Afib, h/o CHF and tachy-brady syndrome, s/p pacemaker placed -On Lopressor 25mg  for HTN -On Lasix 40 mg daily daily for CHF -BP low at 81/86 today but family notes her BP was in 140s at home earlier (04/19/18). Nurse will repeat BP in clinic  -I instructed her monitor BP at home and if  systolic BP lower then 449 she should hold lasix.  -I encouraged her to drink water adequately.   3. H/o Stroke and PE -She was recently taken off anticoagulant,  Xarelto, by Dr. Theda Sers due to her fall risk causes concern for bleeding out.   4. COPD -Pt on 2L [place] oxygen for the past 2 years    5. Anxiety, Depression  -On Xanax 0.25mg  TID PRN and Effexor 225mg  daily  6. Hypotension  -Likely secondary to Lasix, which will be decreased to 20 mg daily.  I recommend her daughter to check her blood pressure and hold Lasix if SBP less than 100  PLAN:  -Lab and f/u in 3 months -will discuss her case in tumor for next week, and I will call her daughter after the conference.   All questions were answered. The patient knows to call the clinic with any problems, questions or concerns. I spent 45 minutes counseling the patient face to face. The total time spent in the appointment was 60 minutes and more than 50% was on counseling.     Truitt Merle, MD 04/19/2018 5:44 PM  I, Joslyn Devon, am acting as scribe for Truitt Merle, MD.   I have reviewed the above documentation for accuracy and completeness, and I agree with the above.

## 2018-04-19 NOTE — Telephone Encounter (Signed)
Appointments scheduled AVS/Calendar printed per 7/29 los °

## 2018-04-20 ENCOUNTER — Encounter: Payer: Self-pay | Admitting: Cardiology

## 2018-04-23 DIAGNOSIS — J45909 Unspecified asthma, uncomplicated: Secondary | ICD-10-CM | POA: Diagnosis not present

## 2018-04-23 DIAGNOSIS — I509 Heart failure, unspecified: Secondary | ICD-10-CM | POA: Diagnosis not present

## 2018-04-23 DIAGNOSIS — C23 Malignant neoplasm of gallbladder: Secondary | ICD-10-CM | POA: Diagnosis not present

## 2018-04-23 DIAGNOSIS — Z86718 Personal history of other venous thrombosis and embolism: Secondary | ICD-10-CM | POA: Diagnosis not present

## 2018-04-23 DIAGNOSIS — Z8673 Personal history of transient ischemic attack (TIA), and cerebral infarction without residual deficits: Secondary | ICD-10-CM | POA: Diagnosis not present

## 2018-04-23 DIAGNOSIS — J410 Simple chronic bronchitis: Secondary | ICD-10-CM | POA: Diagnosis not present

## 2018-04-23 DIAGNOSIS — Z48815 Encounter for surgical aftercare following surgery on the digestive system: Secondary | ICD-10-CM | POA: Diagnosis not present

## 2018-04-23 DIAGNOSIS — I110 Hypertensive heart disease with heart failure: Secondary | ICD-10-CM | POA: Diagnosis not present

## 2018-04-23 DIAGNOSIS — Z9981 Dependence on supplemental oxygen: Secondary | ICD-10-CM | POA: Diagnosis not present

## 2018-04-23 DIAGNOSIS — G4700 Insomnia, unspecified: Secondary | ICD-10-CM | POA: Diagnosis not present

## 2018-04-23 DIAGNOSIS — Z950 Presence of cardiac pacemaker: Secondary | ICD-10-CM | POA: Diagnosis not present

## 2018-04-23 DIAGNOSIS — I480 Paroxysmal atrial fibrillation: Secondary | ICD-10-CM | POA: Diagnosis not present

## 2018-04-23 DIAGNOSIS — Z86711 Personal history of pulmonary embolism: Secondary | ICD-10-CM | POA: Diagnosis not present

## 2018-04-23 DIAGNOSIS — E119 Type 2 diabetes mellitus without complications: Secondary | ICD-10-CM | POA: Diagnosis not present

## 2018-04-23 LAB — CUP PACEART REMOTE DEVICE CHECK
Battery Remaining Longevity: 163 mo
Battery Voltage: 3.08 V
Brady Statistic RV Percent Paced: 47.22 %
Date Time Interrogation Session: 20190729112432
Implantable Lead Location: 753860
Implantable Lead Model: 5076
Lead Channel Impedance Value: 437 Ohm
Lead Channel Setting Pacing Amplitude: 2 V
Lead Channel Setting Pacing Pulse Width: 0.4 ms
Lead Channel Setting Sensing Sensitivity: 0.9 mV
MDC IDC LEAD IMPLANT DT: 20180711
MDC IDC MSMT LEADCHNL RV IMPEDANCE VALUE: 361 Ohm
MDC IDC MSMT LEADCHNL RV PACING THRESHOLD AMPLITUDE: 0.625 V
MDC IDC MSMT LEADCHNL RV PACING THRESHOLD PULSEWIDTH: 0.4 ms
MDC IDC MSMT LEADCHNL RV SENSING INTR AMPL: 3.75 mV
MDC IDC MSMT LEADCHNL RV SENSING INTR AMPL: 3.75 mV
MDC IDC PG IMPLANT DT: 20180711

## 2018-04-26 DIAGNOSIS — E119 Type 2 diabetes mellitus without complications: Secondary | ICD-10-CM | POA: Diagnosis not present

## 2018-04-26 DIAGNOSIS — Z950 Presence of cardiac pacemaker: Secondary | ICD-10-CM | POA: Diagnosis not present

## 2018-04-26 DIAGNOSIS — G4700 Insomnia, unspecified: Secondary | ICD-10-CM | POA: Diagnosis not present

## 2018-04-26 DIAGNOSIS — J45909 Unspecified asthma, uncomplicated: Secondary | ICD-10-CM | POA: Diagnosis not present

## 2018-04-26 DIAGNOSIS — I509 Heart failure, unspecified: Secondary | ICD-10-CM | POA: Diagnosis not present

## 2018-04-26 DIAGNOSIS — I110 Hypertensive heart disease with heart failure: Secondary | ICD-10-CM | POA: Diagnosis not present

## 2018-04-26 DIAGNOSIS — Z9981 Dependence on supplemental oxygen: Secondary | ICD-10-CM | POA: Diagnosis not present

## 2018-04-26 DIAGNOSIS — Z8673 Personal history of transient ischemic attack (TIA), and cerebral infarction without residual deficits: Secondary | ICD-10-CM | POA: Diagnosis not present

## 2018-04-26 DIAGNOSIS — Z86718 Personal history of other venous thrombosis and embolism: Secondary | ICD-10-CM | POA: Diagnosis not present

## 2018-04-26 DIAGNOSIS — I480 Paroxysmal atrial fibrillation: Secondary | ICD-10-CM | POA: Diagnosis not present

## 2018-04-26 DIAGNOSIS — Z86711 Personal history of pulmonary embolism: Secondary | ICD-10-CM | POA: Diagnosis not present

## 2018-04-26 DIAGNOSIS — C23 Malignant neoplasm of gallbladder: Secondary | ICD-10-CM | POA: Diagnosis not present

## 2018-04-26 DIAGNOSIS — Z48815 Encounter for surgical aftercare following surgery on the digestive system: Secondary | ICD-10-CM | POA: Diagnosis not present

## 2018-04-27 DIAGNOSIS — Z9981 Dependence on supplemental oxygen: Secondary | ICD-10-CM | POA: Diagnosis not present

## 2018-04-27 DIAGNOSIS — E119 Type 2 diabetes mellitus without complications: Secondary | ICD-10-CM | POA: Diagnosis not present

## 2018-04-27 DIAGNOSIS — Z8673 Personal history of transient ischemic attack (TIA), and cerebral infarction without residual deficits: Secondary | ICD-10-CM | POA: Diagnosis not present

## 2018-04-27 DIAGNOSIS — J45909 Unspecified asthma, uncomplicated: Secondary | ICD-10-CM | POA: Diagnosis not present

## 2018-04-27 DIAGNOSIS — Z86711 Personal history of pulmonary embolism: Secondary | ICD-10-CM | POA: Diagnosis not present

## 2018-04-27 DIAGNOSIS — Z950 Presence of cardiac pacemaker: Secondary | ICD-10-CM | POA: Diagnosis not present

## 2018-04-27 DIAGNOSIS — Z86718 Personal history of other venous thrombosis and embolism: Secondary | ICD-10-CM | POA: Diagnosis not present

## 2018-04-27 DIAGNOSIS — I110 Hypertensive heart disease with heart failure: Secondary | ICD-10-CM | POA: Diagnosis not present

## 2018-04-27 DIAGNOSIS — C23 Malignant neoplasm of gallbladder: Secondary | ICD-10-CM | POA: Diagnosis not present

## 2018-04-27 DIAGNOSIS — I509 Heart failure, unspecified: Secondary | ICD-10-CM | POA: Diagnosis not present

## 2018-04-27 DIAGNOSIS — Z48815 Encounter for surgical aftercare following surgery on the digestive system: Secondary | ICD-10-CM | POA: Diagnosis not present

## 2018-04-27 DIAGNOSIS — I480 Paroxysmal atrial fibrillation: Secondary | ICD-10-CM | POA: Diagnosis not present

## 2018-04-27 DIAGNOSIS — G4700 Insomnia, unspecified: Secondary | ICD-10-CM | POA: Diagnosis not present

## 2018-04-28 DIAGNOSIS — G4700 Insomnia, unspecified: Secondary | ICD-10-CM | POA: Diagnosis not present

## 2018-04-28 DIAGNOSIS — Z86711 Personal history of pulmonary embolism: Secondary | ICD-10-CM | POA: Diagnosis not present

## 2018-04-28 DIAGNOSIS — Z86718 Personal history of other venous thrombosis and embolism: Secondary | ICD-10-CM | POA: Diagnosis not present

## 2018-04-28 DIAGNOSIS — Z8673 Personal history of transient ischemic attack (TIA), and cerebral infarction without residual deficits: Secondary | ICD-10-CM | POA: Diagnosis not present

## 2018-04-28 DIAGNOSIS — J45909 Unspecified asthma, uncomplicated: Secondary | ICD-10-CM | POA: Diagnosis not present

## 2018-04-28 DIAGNOSIS — C23 Malignant neoplasm of gallbladder: Secondary | ICD-10-CM | POA: Diagnosis not present

## 2018-04-28 DIAGNOSIS — Z48815 Encounter for surgical aftercare following surgery on the digestive system: Secondary | ICD-10-CM | POA: Diagnosis not present

## 2018-04-28 DIAGNOSIS — I480 Paroxysmal atrial fibrillation: Secondary | ICD-10-CM | POA: Diagnosis not present

## 2018-04-28 DIAGNOSIS — I509 Heart failure, unspecified: Secondary | ICD-10-CM | POA: Diagnosis not present

## 2018-04-28 DIAGNOSIS — Z950 Presence of cardiac pacemaker: Secondary | ICD-10-CM | POA: Diagnosis not present

## 2018-04-28 DIAGNOSIS — E119 Type 2 diabetes mellitus without complications: Secondary | ICD-10-CM | POA: Diagnosis not present

## 2018-04-28 DIAGNOSIS — I110 Hypertensive heart disease with heart failure: Secondary | ICD-10-CM | POA: Diagnosis not present

## 2018-04-28 DIAGNOSIS — Z9981 Dependence on supplemental oxygen: Secondary | ICD-10-CM | POA: Diagnosis not present

## 2018-04-29 DIAGNOSIS — C23 Malignant neoplasm of gallbladder: Secondary | ICD-10-CM | POA: Diagnosis not present

## 2018-04-29 DIAGNOSIS — I480 Paroxysmal atrial fibrillation: Secondary | ICD-10-CM | POA: Diagnosis not present

## 2018-04-29 DIAGNOSIS — E119 Type 2 diabetes mellitus without complications: Secondary | ICD-10-CM | POA: Diagnosis not present

## 2018-04-29 DIAGNOSIS — J45909 Unspecified asthma, uncomplicated: Secondary | ICD-10-CM | POA: Diagnosis not present

## 2018-04-29 DIAGNOSIS — Z950 Presence of cardiac pacemaker: Secondary | ICD-10-CM | POA: Diagnosis not present

## 2018-04-29 DIAGNOSIS — G4700 Insomnia, unspecified: Secondary | ICD-10-CM | POA: Diagnosis not present

## 2018-04-29 DIAGNOSIS — Z48815 Encounter for surgical aftercare following surgery on the digestive system: Secondary | ICD-10-CM | POA: Diagnosis not present

## 2018-04-29 DIAGNOSIS — Z86711 Personal history of pulmonary embolism: Secondary | ICD-10-CM | POA: Diagnosis not present

## 2018-04-29 DIAGNOSIS — Z8673 Personal history of transient ischemic attack (TIA), and cerebral infarction without residual deficits: Secondary | ICD-10-CM | POA: Diagnosis not present

## 2018-04-29 DIAGNOSIS — Z9981 Dependence on supplemental oxygen: Secondary | ICD-10-CM | POA: Diagnosis not present

## 2018-04-29 DIAGNOSIS — Z86718 Personal history of other venous thrombosis and embolism: Secondary | ICD-10-CM | POA: Diagnosis not present

## 2018-04-29 DIAGNOSIS — I509 Heart failure, unspecified: Secondary | ICD-10-CM | POA: Diagnosis not present

## 2018-04-29 DIAGNOSIS — I110 Hypertensive heart disease with heart failure: Secondary | ICD-10-CM | POA: Diagnosis not present

## 2018-04-29 DIAGNOSIS — J449 Chronic obstructive pulmonary disease, unspecified: Secondary | ICD-10-CM | POA: Diagnosis not present

## 2018-04-30 DIAGNOSIS — Z48815 Encounter for surgical aftercare following surgery on the digestive system: Secondary | ICD-10-CM | POA: Diagnosis not present

## 2018-04-30 DIAGNOSIS — E119 Type 2 diabetes mellitus without complications: Secondary | ICD-10-CM | POA: Diagnosis not present

## 2018-04-30 DIAGNOSIS — Z9981 Dependence on supplemental oxygen: Secondary | ICD-10-CM | POA: Diagnosis not present

## 2018-04-30 DIAGNOSIS — Z8673 Personal history of transient ischemic attack (TIA), and cerebral infarction without residual deficits: Secondary | ICD-10-CM | POA: Diagnosis not present

## 2018-04-30 DIAGNOSIS — G4700 Insomnia, unspecified: Secondary | ICD-10-CM | POA: Diagnosis not present

## 2018-04-30 DIAGNOSIS — Z86711 Personal history of pulmonary embolism: Secondary | ICD-10-CM | POA: Diagnosis not present

## 2018-04-30 DIAGNOSIS — C23 Malignant neoplasm of gallbladder: Secondary | ICD-10-CM | POA: Diagnosis not present

## 2018-04-30 DIAGNOSIS — J45909 Unspecified asthma, uncomplicated: Secondary | ICD-10-CM | POA: Diagnosis not present

## 2018-04-30 DIAGNOSIS — I480 Paroxysmal atrial fibrillation: Secondary | ICD-10-CM | POA: Diagnosis not present

## 2018-04-30 DIAGNOSIS — I509 Heart failure, unspecified: Secondary | ICD-10-CM | POA: Diagnosis not present

## 2018-04-30 DIAGNOSIS — Z86718 Personal history of other venous thrombosis and embolism: Secondary | ICD-10-CM | POA: Diagnosis not present

## 2018-04-30 DIAGNOSIS — I110 Hypertensive heart disease with heart failure: Secondary | ICD-10-CM | POA: Diagnosis not present

## 2018-04-30 DIAGNOSIS — Z950 Presence of cardiac pacemaker: Secondary | ICD-10-CM | POA: Diagnosis not present

## 2018-05-03 ENCOUNTER — Other Ambulatory Visit: Payer: Self-pay

## 2018-05-03 DIAGNOSIS — Z48815 Encounter for surgical aftercare following surgery on the digestive system: Secondary | ICD-10-CM | POA: Diagnosis not present

## 2018-05-03 DIAGNOSIS — Z8673 Personal history of transient ischemic attack (TIA), and cerebral infarction without residual deficits: Secondary | ICD-10-CM | POA: Diagnosis not present

## 2018-05-03 DIAGNOSIS — Z86711 Personal history of pulmonary embolism: Secondary | ICD-10-CM | POA: Diagnosis not present

## 2018-05-03 DIAGNOSIS — G4700 Insomnia, unspecified: Secondary | ICD-10-CM | POA: Diagnosis not present

## 2018-05-03 DIAGNOSIS — I509 Heart failure, unspecified: Secondary | ICD-10-CM | POA: Diagnosis not present

## 2018-05-03 DIAGNOSIS — E119 Type 2 diabetes mellitus without complications: Secondary | ICD-10-CM | POA: Diagnosis not present

## 2018-05-03 DIAGNOSIS — Z86718 Personal history of other venous thrombosis and embolism: Secondary | ICD-10-CM | POA: Diagnosis not present

## 2018-05-03 DIAGNOSIS — C23 Malignant neoplasm of gallbladder: Secondary | ICD-10-CM | POA: Diagnosis not present

## 2018-05-03 DIAGNOSIS — Z9981 Dependence on supplemental oxygen: Secondary | ICD-10-CM | POA: Diagnosis not present

## 2018-05-03 DIAGNOSIS — I110 Hypertensive heart disease with heart failure: Secondary | ICD-10-CM | POA: Diagnosis not present

## 2018-05-03 DIAGNOSIS — I480 Paroxysmal atrial fibrillation: Secondary | ICD-10-CM | POA: Diagnosis not present

## 2018-05-03 DIAGNOSIS — J45909 Unspecified asthma, uncomplicated: Secondary | ICD-10-CM | POA: Diagnosis not present

## 2018-05-03 DIAGNOSIS — Z950 Presence of cardiac pacemaker: Secondary | ICD-10-CM | POA: Diagnosis not present

## 2018-05-03 NOTE — Progress Notes (Signed)
Called daughter, Aretha Parrot, to update on GI tumor board discussion from 8.7.19. Observation is recommended. [name] advised that agreed with plan and would like to cancel future appointments with medical oncology. "What happens will happen."  She does not want any further testing. I will communicate this to Dr. Burr Medico and to her collaborative nurse.

## 2018-05-03 NOTE — Progress Notes (Signed)
Sent scheduling message to cancel future appointments per patients request.

## 2018-05-04 DIAGNOSIS — I509 Heart failure, unspecified: Secondary | ICD-10-CM | POA: Diagnosis not present

## 2018-05-04 DIAGNOSIS — Z86711 Personal history of pulmonary embolism: Secondary | ICD-10-CM | POA: Diagnosis not present

## 2018-05-04 DIAGNOSIS — G4700 Insomnia, unspecified: Secondary | ICD-10-CM | POA: Diagnosis not present

## 2018-05-04 DIAGNOSIS — I110 Hypertensive heart disease with heart failure: Secondary | ICD-10-CM | POA: Diagnosis not present

## 2018-05-04 DIAGNOSIS — E119 Type 2 diabetes mellitus without complications: Secondary | ICD-10-CM | POA: Diagnosis not present

## 2018-05-04 DIAGNOSIS — J45909 Unspecified asthma, uncomplicated: Secondary | ICD-10-CM | POA: Diagnosis not present

## 2018-05-04 DIAGNOSIS — I480 Paroxysmal atrial fibrillation: Secondary | ICD-10-CM | POA: Diagnosis not present

## 2018-05-04 DIAGNOSIS — Z48815 Encounter for surgical aftercare following surgery on the digestive system: Secondary | ICD-10-CM | POA: Diagnosis not present

## 2018-05-04 DIAGNOSIS — Z950 Presence of cardiac pacemaker: Secondary | ICD-10-CM | POA: Diagnosis not present

## 2018-05-04 DIAGNOSIS — Z9981 Dependence on supplemental oxygen: Secondary | ICD-10-CM | POA: Diagnosis not present

## 2018-05-04 DIAGNOSIS — C23 Malignant neoplasm of gallbladder: Secondary | ICD-10-CM | POA: Diagnosis not present

## 2018-05-04 DIAGNOSIS — Z86718 Personal history of other venous thrombosis and embolism: Secondary | ICD-10-CM | POA: Diagnosis not present

## 2018-05-04 DIAGNOSIS — Z8673 Personal history of transient ischemic attack (TIA), and cerebral infarction without residual deficits: Secondary | ICD-10-CM | POA: Diagnosis not present

## 2018-05-05 DIAGNOSIS — Z9981 Dependence on supplemental oxygen: Secondary | ICD-10-CM | POA: Diagnosis not present

## 2018-05-05 DIAGNOSIS — J45909 Unspecified asthma, uncomplicated: Secondary | ICD-10-CM | POA: Diagnosis not present

## 2018-05-05 DIAGNOSIS — C23 Malignant neoplasm of gallbladder: Secondary | ICD-10-CM | POA: Diagnosis not present

## 2018-05-05 DIAGNOSIS — Z950 Presence of cardiac pacemaker: Secondary | ICD-10-CM | POA: Diagnosis not present

## 2018-05-05 DIAGNOSIS — Z8673 Personal history of transient ischemic attack (TIA), and cerebral infarction without residual deficits: Secondary | ICD-10-CM | POA: Diagnosis not present

## 2018-05-05 DIAGNOSIS — G4700 Insomnia, unspecified: Secondary | ICD-10-CM | POA: Diagnosis not present

## 2018-05-05 DIAGNOSIS — Z86711 Personal history of pulmonary embolism: Secondary | ICD-10-CM | POA: Diagnosis not present

## 2018-05-05 DIAGNOSIS — I509 Heart failure, unspecified: Secondary | ICD-10-CM | POA: Diagnosis not present

## 2018-05-05 DIAGNOSIS — I480 Paroxysmal atrial fibrillation: Secondary | ICD-10-CM | POA: Diagnosis not present

## 2018-05-05 DIAGNOSIS — I110 Hypertensive heart disease with heart failure: Secondary | ICD-10-CM | POA: Diagnosis not present

## 2018-05-05 DIAGNOSIS — Z48815 Encounter for surgical aftercare following surgery on the digestive system: Secondary | ICD-10-CM | POA: Diagnosis not present

## 2018-05-05 DIAGNOSIS — E119 Type 2 diabetes mellitus without complications: Secondary | ICD-10-CM | POA: Diagnosis not present

## 2018-05-05 DIAGNOSIS — Z86718 Personal history of other venous thrombosis and embolism: Secondary | ICD-10-CM | POA: Diagnosis not present

## 2018-05-05 NOTE — Progress Notes (Deleted)
Cardiology Office Note:    Date:  05/05/2018   ID:  LOGAN VEGH, DOB 1927/09/29, MRN [id number]  PCP:  Janie Morning, DO  Cardiologist:  Quay Burow, MD   Referring MD: Janie Morning, DO   No chief complaint on file. ***  History of Present Illness:    Raven Gray is a 84 y.o. female with a hx of chronic diastolic heart failure, chronic atrial fibrillation not on anticoagulation due to falls, hx of VT, essential hypertension, history of PE, tachy-brady syndrome status post pacemaker in place, COPD on chronic O2,, and hyperlipidemia.  She has a remote history of DVT/PE previously on Xarelto but stopped 10/2017 due to age and fall risk.  Last echocardiogram on 11/07/2017 with an LVEF of 2 to 55%.  Myoview 02/22/2016- for reversible ischemia.  She last saw Rosaria Ferries [facility] 03/01/2018.  At that time it was noted that home health had increased her beta-blocker dose.  She was short of breath and pacemaker interrogation showed that she was V pacing 67.5% of the time compared to only 20% of time previously.  After review with Dr. Sallyanne Kuster, it was felt that the increased dose of beta-blocker was contributing to chronotropic incompetence and beta-blocker dose was reduced to her previous dose.  Last interrogation on 04/19/2018 with RV pacing 42% of the time.  She was recently hospitalized and found to have cholecystitis and is status post lap chole.  She was found to have invasive adenocarcinoma.  She was discharged on 04/01/2018 to a SNF.  She returns today for follow-up appointment.   Past Medical History:  Diagnosis Date  . Atrial fibrillation ([place])    off AC due to falls  . Chest pain   . CHF (congestive heart failure) ([place])   . Cholecystitis 03/2018  . COPD (chronic obstructive pulmonary disease) (HCC)    wears prn home O2  . Dementia   . H/O echocardiogram    a. 08/2016: EF of 60-65%, severely dilated LA, mild pulmonic regurgitations, mild TR, and PA Pressure at 38 mm Hg  . History of  depression   . History of pulmonary embolism   . Hyperlipidemia   . Hypertension   . Hypokalemia    Now improved with treatment  . Pre-diabetes   . Shortness of breath   . 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 Tachy-brady syndrome ([place]) 04/01/2017   s/p MDT PPM     Past Surgical History:  Procedure Laterality Date  . ABDOMINAL HYSTERECTOMY    . BACK SURGERY    . CHOLECYSTECTOMY N/A 03/29/2018   Procedure: LAPAROSCOPIC CHOLECYSTECTOMY WITH INTRAOPERATIVE CHOLANGIOGRAM;  Surgeon: Alphonsa Overall, MD;  Location: [place];  Service: General;  Laterality: N/A;  . PACEMAKER IMPLANT N/A 04/01/2017   Procedure: Pacemaker Implant;  Surgeon: Sanda Klein, MD;  Location: [place] CV LAB;  Service: Cardiovascular;  Laterality: N/A; MDT Azure XT SR MRI  . REPLACEMENT TOTAL KNEE    . ROTATOR CUFF REPAIR      Current Medications: No outpatient medications have been marked as taking for the 05/06/18 encounter (Appointment) with Ledora Bottcher, [place].     Allergies:   Patient has no known allergies.   Social History   Socioeconomic History  . Marital status: Widowed    Spouse name: Not on file  . Number of children: Not on file  . Years of education: Not on file  . Highest education level: Not on file  Occupational History  .  Not on file  Social Needs  . Financial resource strain: Not on file  . Food insecurity:    Worry: Not on file    Inability: Not on file  . Transportation needs:    Medical: Not on file    Non-medical: Not on file  Tobacco Use  . Smoking status: Never Smoker  . Smokeless tobacco: Never Used  Substance and Sexual Activity  . Alcohol use: No  . Drug use: No  . Sexual activity: Not Currently  Lifestyle  . Physical activity:    Days per week: Not on file    Minutes per session: Not on file  . Stress: Not on file  Relationships  . Social connections:    Talks on phone: Not on file    Gets together: Not on file    Attends religious service:  Not on file    Active member of club or organization: Not on file    Attends meetings of clubs or organizations: Not on file    Relationship status: Not on file  Other Topics Concern  . Not on file  Social History Narrative  . Not on file     Family History: The patient's ***family history includes Alzheimer's disease in her mother; Diabetes type II in her daughter; Heart attack in her father; Heart disease in her daughter.  ROS:   Please see the history of present illness.    *** All other systems reviewed and are negative.  EKGs/Labs/Other Studies Reviewed:    The following studies were reviewed today: ***  EKG:  EKG is *** ordered today.  The ekg ordered today demonstrates ***  Recent Labs: 03/29/2018: B Natriuretic Peptide 811.5 03/30/2018: ALT 24 04/01/2018: BUN 15; Creatinine, Ser 0.91; Hemoglobin 13.0; Magnesium 1.7; Platelets 148; Potassium 3.6; Sodium 143  Recent Lipid Panel    Component Value Date/Time   CHOL 115 01/09/2017 0356   TRIG 86 01/09/2017 0356   HDL 40 (L) 01/09/2017 0356   CHOLHDL 2.9 01/09/2017 0356   VLDL 17 01/09/2017 0356   LDLCALC 58 01/09/2017 0356    Physical Exam:    VS:  There were no vitals taken for this visit.    Wt Readings from Last 3 Encounters:  04/19/18 150 lb 11.2 oz (68.4 kg)  03/29/18 157 lb (71.2 kg)  03/01/18 157 lb (71.2 kg)     GEN: *** Well nourished, well developed in no acute distress HEENT: Normal NECK: No JVD; No carotid bruits LYMPHATICS: No lymphadenopathy CARDIAC: ***RRR, no murmurs, rubs, gallops RESPIRATORY:  Clear to auscultation without rales, wheezing or rhonchi  ABDOMEN: Soft, non-tender, non-distended MUSCULOSKELETAL:  No edema; No deformity  SKIN: Warm and dry NEUROLOGIC:  Alert and oriented x 3 PSYCHIATRIC:  Normal affect   ASSESSMENT:    No diagnosis found. PLAN:    In order of problems listed above:  No diagnosis found.   Medication Adjustments/Labs and Tests Ordered: Current medicines  are reviewed at length with the patient today.  Concerns regarding medicines are outlined above.  No orders of the defined types were placed in this encounter.  No orders of the defined types were placed in this encounter.   Signed, Ledora Bottcher, [place]  05/05/2018 9:27 PM    [facility]

## 2018-05-06 ENCOUNTER — Ambulatory Visit: Payer: Medicare Other | Admitting: Physician Assistant

## 2018-05-06 DIAGNOSIS — Z8673 Personal history of transient ischemic attack (TIA), and cerebral infarction without residual deficits: Secondary | ICD-10-CM | POA: Diagnosis not present

## 2018-05-06 DIAGNOSIS — I110 Hypertensive heart disease with heart failure: Secondary | ICD-10-CM | POA: Diagnosis not present

## 2018-05-06 DIAGNOSIS — Z86718 Personal history of other venous thrombosis and embolism: Secondary | ICD-10-CM | POA: Diagnosis not present

## 2018-05-06 DIAGNOSIS — I480 Paroxysmal atrial fibrillation: Secondary | ICD-10-CM | POA: Diagnosis not present

## 2018-05-06 DIAGNOSIS — I509 Heart failure, unspecified: Secondary | ICD-10-CM | POA: Diagnosis not present

## 2018-05-06 DIAGNOSIS — Z48815 Encounter for surgical aftercare following surgery on the digestive system: Secondary | ICD-10-CM | POA: Diagnosis not present

## 2018-05-06 DIAGNOSIS — G4700 Insomnia, unspecified: Secondary | ICD-10-CM | POA: Diagnosis not present

## 2018-05-06 DIAGNOSIS — J45909 Unspecified asthma, uncomplicated: Secondary | ICD-10-CM | POA: Diagnosis not present

## 2018-05-06 DIAGNOSIS — Z950 Presence of cardiac pacemaker: Secondary | ICD-10-CM | POA: Diagnosis not present

## 2018-05-06 DIAGNOSIS — E119 Type 2 diabetes mellitus without complications: Secondary | ICD-10-CM | POA: Diagnosis not present

## 2018-05-06 DIAGNOSIS — Z9981 Dependence on supplemental oxygen: Secondary | ICD-10-CM | POA: Diagnosis not present

## 2018-05-06 DIAGNOSIS — Z86711 Personal history of pulmonary embolism: Secondary | ICD-10-CM | POA: Diagnosis not present

## 2018-05-06 DIAGNOSIS — C23 Malignant neoplasm of gallbladder: Secondary | ICD-10-CM | POA: Diagnosis not present

## 2018-05-10 DIAGNOSIS — I110 Hypertensive heart disease with heart failure: Secondary | ICD-10-CM | POA: Diagnosis not present

## 2018-05-10 DIAGNOSIS — Z48815 Encounter for surgical aftercare following surgery on the digestive system: Secondary | ICD-10-CM | POA: Diagnosis not present

## 2018-05-10 DIAGNOSIS — E119 Type 2 diabetes mellitus without complications: Secondary | ICD-10-CM | POA: Diagnosis not present

## 2018-05-10 DIAGNOSIS — C23 Malignant neoplasm of gallbladder: Secondary | ICD-10-CM | POA: Diagnosis not present

## 2018-05-10 DIAGNOSIS — I509 Heart failure, unspecified: Secondary | ICD-10-CM | POA: Diagnosis not present

## 2018-05-10 DIAGNOSIS — Z86711 Personal history of pulmonary embolism: Secondary | ICD-10-CM | POA: Diagnosis not present

## 2018-05-10 DIAGNOSIS — J45909 Unspecified asthma, uncomplicated: Secondary | ICD-10-CM | POA: Diagnosis not present

## 2018-05-10 DIAGNOSIS — Z9981 Dependence on supplemental oxygen: Secondary | ICD-10-CM | POA: Diagnosis not present

## 2018-05-10 DIAGNOSIS — Z950 Presence of cardiac pacemaker: Secondary | ICD-10-CM | POA: Diagnosis not present

## 2018-05-10 DIAGNOSIS — Z86718 Personal history of other venous thrombosis and embolism: Secondary | ICD-10-CM | POA: Diagnosis not present

## 2018-05-10 DIAGNOSIS — Z8673 Personal history of transient ischemic attack (TIA), and cerebral infarction without residual deficits: Secondary | ICD-10-CM | POA: Diagnosis not present

## 2018-05-10 DIAGNOSIS — G4700 Insomnia, unspecified: Secondary | ICD-10-CM | POA: Diagnosis not present

## 2018-05-10 DIAGNOSIS — I480 Paroxysmal atrial fibrillation: Secondary | ICD-10-CM | POA: Diagnosis not present

## 2018-05-11 DIAGNOSIS — Z48815 Encounter for surgical aftercare following surgery on the digestive system: Secondary | ICD-10-CM | POA: Diagnosis not present

## 2018-05-11 DIAGNOSIS — Z8673 Personal history of transient ischemic attack (TIA), and cerebral infarction without residual deficits: Secondary | ICD-10-CM | POA: Diagnosis not present

## 2018-05-11 DIAGNOSIS — J45909 Unspecified asthma, uncomplicated: Secondary | ICD-10-CM | POA: Diagnosis not present

## 2018-05-11 DIAGNOSIS — I509 Heart failure, unspecified: Secondary | ICD-10-CM | POA: Diagnosis not present

## 2018-05-11 DIAGNOSIS — G4700 Insomnia, unspecified: Secondary | ICD-10-CM | POA: Diagnosis not present

## 2018-05-11 DIAGNOSIS — Z950 Presence of cardiac pacemaker: Secondary | ICD-10-CM | POA: Diagnosis not present

## 2018-05-11 DIAGNOSIS — I110 Hypertensive heart disease with heart failure: Secondary | ICD-10-CM | POA: Diagnosis not present

## 2018-05-11 DIAGNOSIS — I480 Paroxysmal atrial fibrillation: Secondary | ICD-10-CM | POA: Diagnosis not present

## 2018-05-11 DIAGNOSIS — Z9981 Dependence on supplemental oxygen: Secondary | ICD-10-CM | POA: Diagnosis not present

## 2018-05-11 DIAGNOSIS — E119 Type 2 diabetes mellitus without complications: Secondary | ICD-10-CM | POA: Diagnosis not present

## 2018-05-11 DIAGNOSIS — C23 Malignant neoplasm of gallbladder: Secondary | ICD-10-CM | POA: Diagnosis not present

## 2018-05-11 DIAGNOSIS — Z86718 Personal history of other venous thrombosis and embolism: Secondary | ICD-10-CM | POA: Diagnosis not present

## 2018-05-11 DIAGNOSIS — Z86711 Personal history of pulmonary embolism: Secondary | ICD-10-CM | POA: Diagnosis not present

## 2018-05-12 DIAGNOSIS — G4700 Insomnia, unspecified: Secondary | ICD-10-CM | POA: Diagnosis not present

## 2018-05-12 DIAGNOSIS — Z86711 Personal history of pulmonary embolism: Secondary | ICD-10-CM | POA: Diagnosis not present

## 2018-05-12 DIAGNOSIS — Z48815 Encounter for surgical aftercare following surgery on the digestive system: Secondary | ICD-10-CM | POA: Diagnosis not present

## 2018-05-12 DIAGNOSIS — Z950 Presence of cardiac pacemaker: Secondary | ICD-10-CM | POA: Diagnosis not present

## 2018-05-12 DIAGNOSIS — I110 Hypertensive heart disease with heart failure: Secondary | ICD-10-CM | POA: Diagnosis not present

## 2018-05-12 DIAGNOSIS — J45909 Unspecified asthma, uncomplicated: Secondary | ICD-10-CM | POA: Diagnosis not present

## 2018-05-12 DIAGNOSIS — I480 Paroxysmal atrial fibrillation: Secondary | ICD-10-CM | POA: Diagnosis not present

## 2018-05-12 DIAGNOSIS — E119 Type 2 diabetes mellitus without complications: Secondary | ICD-10-CM | POA: Diagnosis not present

## 2018-05-12 DIAGNOSIS — C23 Malignant neoplasm of gallbladder: Secondary | ICD-10-CM | POA: Diagnosis not present

## 2018-05-12 DIAGNOSIS — Z9981 Dependence on supplemental oxygen: Secondary | ICD-10-CM | POA: Diagnosis not present

## 2018-05-12 DIAGNOSIS — Z86718 Personal history of other venous thrombosis and embolism: Secondary | ICD-10-CM | POA: Diagnosis not present

## 2018-05-12 DIAGNOSIS — I509 Heart failure, unspecified: Secondary | ICD-10-CM | POA: Diagnosis not present

## 2018-05-12 DIAGNOSIS — Z8673 Personal history of transient ischemic attack (TIA), and cerebral infarction without residual deficits: Secondary | ICD-10-CM | POA: Diagnosis not present

## 2018-05-13 DIAGNOSIS — Z950 Presence of cardiac pacemaker: Secondary | ICD-10-CM | POA: Diagnosis not present

## 2018-05-13 DIAGNOSIS — G4700 Insomnia, unspecified: Secondary | ICD-10-CM | POA: Diagnosis not present

## 2018-05-13 DIAGNOSIS — I480 Paroxysmal atrial fibrillation: Secondary | ICD-10-CM | POA: Diagnosis not present

## 2018-05-13 DIAGNOSIS — I509 Heart failure, unspecified: Secondary | ICD-10-CM | POA: Diagnosis not present

## 2018-05-13 DIAGNOSIS — C23 Malignant neoplasm of gallbladder: Secondary | ICD-10-CM | POA: Diagnosis not present

## 2018-05-13 DIAGNOSIS — E119 Type 2 diabetes mellitus without complications: Secondary | ICD-10-CM | POA: Diagnosis not present

## 2018-05-13 DIAGNOSIS — Z8673 Personal history of transient ischemic attack (TIA), and cerebral infarction without residual deficits: Secondary | ICD-10-CM | POA: Diagnosis not present

## 2018-05-13 DIAGNOSIS — Z9981 Dependence on supplemental oxygen: Secondary | ICD-10-CM | POA: Diagnosis not present

## 2018-05-13 DIAGNOSIS — I110 Hypertensive heart disease with heart failure: Secondary | ICD-10-CM | POA: Diagnosis not present

## 2018-05-13 DIAGNOSIS — Z48815 Encounter for surgical aftercare following surgery on the digestive system: Secondary | ICD-10-CM | POA: Diagnosis not present

## 2018-05-13 DIAGNOSIS — Z86718 Personal history of other venous thrombosis and embolism: Secondary | ICD-10-CM | POA: Diagnosis not present

## 2018-05-13 DIAGNOSIS — Z86711 Personal history of pulmonary embolism: Secondary | ICD-10-CM | POA: Diagnosis not present

## 2018-05-13 DIAGNOSIS — J45909 Unspecified asthma, uncomplicated: Secondary | ICD-10-CM | POA: Diagnosis not present

## 2018-05-17 DIAGNOSIS — Z86718 Personal history of other venous thrombosis and embolism: Secondary | ICD-10-CM | POA: Diagnosis not present

## 2018-05-17 DIAGNOSIS — J45909 Unspecified asthma, uncomplicated: Secondary | ICD-10-CM | POA: Diagnosis not present

## 2018-05-17 DIAGNOSIS — I509 Heart failure, unspecified: Secondary | ICD-10-CM | POA: Diagnosis not present

## 2018-05-17 DIAGNOSIS — I110 Hypertensive heart disease with heart failure: Secondary | ICD-10-CM | POA: Diagnosis not present

## 2018-05-17 DIAGNOSIS — E119 Type 2 diabetes mellitus without complications: Secondary | ICD-10-CM | POA: Diagnosis not present

## 2018-05-17 DIAGNOSIS — Z8673 Personal history of transient ischemic attack (TIA), and cerebral infarction without residual deficits: Secondary | ICD-10-CM | POA: Diagnosis not present

## 2018-05-17 DIAGNOSIS — Z9981 Dependence on supplemental oxygen: Secondary | ICD-10-CM | POA: Diagnosis not present

## 2018-05-17 DIAGNOSIS — G4700 Insomnia, unspecified: Secondary | ICD-10-CM | POA: Diagnosis not present

## 2018-05-17 DIAGNOSIS — C23 Malignant neoplasm of gallbladder: Secondary | ICD-10-CM | POA: Diagnosis not present

## 2018-05-17 DIAGNOSIS — Z48815 Encounter for surgical aftercare following surgery on the digestive system: Secondary | ICD-10-CM | POA: Diagnosis not present

## 2018-05-17 DIAGNOSIS — Z950 Presence of cardiac pacemaker: Secondary | ICD-10-CM | POA: Diagnosis not present

## 2018-05-17 DIAGNOSIS — Z86711 Personal history of pulmonary embolism: Secondary | ICD-10-CM | POA: Diagnosis not present

## 2018-05-17 DIAGNOSIS — I480 Paroxysmal atrial fibrillation: Secondary | ICD-10-CM | POA: Diagnosis not present

## 2018-05-18 DIAGNOSIS — I509 Heart failure, unspecified: Secondary | ICD-10-CM | POA: Diagnosis not present

## 2018-05-18 DIAGNOSIS — Z8673 Personal history of transient ischemic attack (TIA), and cerebral infarction without residual deficits: Secondary | ICD-10-CM | POA: Diagnosis not present

## 2018-05-18 DIAGNOSIS — Z950 Presence of cardiac pacemaker: Secondary | ICD-10-CM | POA: Diagnosis not present

## 2018-05-18 DIAGNOSIS — J45909 Unspecified asthma, uncomplicated: Secondary | ICD-10-CM | POA: Diagnosis not present

## 2018-05-18 DIAGNOSIS — I480 Paroxysmal atrial fibrillation: Secondary | ICD-10-CM | POA: Diagnosis not present

## 2018-05-18 DIAGNOSIS — Z48815 Encounter for surgical aftercare following surgery on the digestive system: Secondary | ICD-10-CM | POA: Diagnosis not present

## 2018-05-18 DIAGNOSIS — E119 Type 2 diabetes mellitus without complications: Secondary | ICD-10-CM | POA: Diagnosis not present

## 2018-05-18 DIAGNOSIS — Z9981 Dependence on supplemental oxygen: Secondary | ICD-10-CM | POA: Diagnosis not present

## 2018-05-18 DIAGNOSIS — C23 Malignant neoplasm of gallbladder: Secondary | ICD-10-CM | POA: Diagnosis not present

## 2018-05-18 DIAGNOSIS — I110 Hypertensive heart disease with heart failure: Secondary | ICD-10-CM | POA: Diagnosis not present

## 2018-05-18 DIAGNOSIS — G4700 Insomnia, unspecified: Secondary | ICD-10-CM | POA: Diagnosis not present

## 2018-05-18 DIAGNOSIS — Z86711 Personal history of pulmonary embolism: Secondary | ICD-10-CM | POA: Diagnosis not present

## 2018-05-18 DIAGNOSIS — Z86718 Personal history of other venous thrombosis and embolism: Secondary | ICD-10-CM | POA: Diagnosis not present

## 2018-05-19 DIAGNOSIS — Z950 Presence of cardiac pacemaker: Secondary | ICD-10-CM | POA: Diagnosis not present

## 2018-05-19 DIAGNOSIS — G4700 Insomnia, unspecified: Secondary | ICD-10-CM | POA: Diagnosis not present

## 2018-05-19 DIAGNOSIS — Z86711 Personal history of pulmonary embolism: Secondary | ICD-10-CM | POA: Diagnosis not present

## 2018-05-19 DIAGNOSIS — I480 Paroxysmal atrial fibrillation: Secondary | ICD-10-CM | POA: Diagnosis not present

## 2018-05-19 DIAGNOSIS — I509 Heart failure, unspecified: Secondary | ICD-10-CM | POA: Diagnosis not present

## 2018-05-19 DIAGNOSIS — I110 Hypertensive heart disease with heart failure: Secondary | ICD-10-CM | POA: Diagnosis not present

## 2018-05-19 DIAGNOSIS — Z9981 Dependence on supplemental oxygen: Secondary | ICD-10-CM | POA: Diagnosis not present

## 2018-05-19 DIAGNOSIS — Z48815 Encounter for surgical aftercare following surgery on the digestive system: Secondary | ICD-10-CM | POA: Diagnosis not present

## 2018-05-19 DIAGNOSIS — J45909 Unspecified asthma, uncomplicated: Secondary | ICD-10-CM | POA: Diagnosis not present

## 2018-05-19 DIAGNOSIS — Z86718 Personal history of other venous thrombosis and embolism: Secondary | ICD-10-CM | POA: Diagnosis not present

## 2018-05-19 DIAGNOSIS — C23 Malignant neoplasm of gallbladder: Secondary | ICD-10-CM | POA: Diagnosis not present

## 2018-05-19 DIAGNOSIS — Z8673 Personal history of transient ischemic attack (TIA), and cerebral infarction without residual deficits: Secondary | ICD-10-CM | POA: Diagnosis not present

## 2018-05-19 DIAGNOSIS — E119 Type 2 diabetes mellitus without complications: Secondary | ICD-10-CM | POA: Diagnosis not present

## 2018-05-20 DIAGNOSIS — Z86718 Personal history of other venous thrombosis and embolism: Secondary | ICD-10-CM | POA: Diagnosis not present

## 2018-05-20 DIAGNOSIS — Z86711 Personal history of pulmonary embolism: Secondary | ICD-10-CM | POA: Diagnosis not present

## 2018-05-20 DIAGNOSIS — G4700 Insomnia, unspecified: Secondary | ICD-10-CM | POA: Diagnosis not present

## 2018-05-20 DIAGNOSIS — Z8673 Personal history of transient ischemic attack (TIA), and cerebral infarction without residual deficits: Secondary | ICD-10-CM | POA: Diagnosis not present

## 2018-05-20 DIAGNOSIS — Z9981 Dependence on supplemental oxygen: Secondary | ICD-10-CM | POA: Diagnosis not present

## 2018-05-20 DIAGNOSIS — C23 Malignant neoplasm of gallbladder: Secondary | ICD-10-CM | POA: Diagnosis not present

## 2018-05-20 DIAGNOSIS — I480 Paroxysmal atrial fibrillation: Secondary | ICD-10-CM | POA: Diagnosis not present

## 2018-05-20 DIAGNOSIS — Z950 Presence of cardiac pacemaker: Secondary | ICD-10-CM | POA: Diagnosis not present

## 2018-05-20 DIAGNOSIS — I110 Hypertensive heart disease with heart failure: Secondary | ICD-10-CM | POA: Diagnosis not present

## 2018-05-20 DIAGNOSIS — J45909 Unspecified asthma, uncomplicated: Secondary | ICD-10-CM | POA: Diagnosis not present

## 2018-05-20 DIAGNOSIS — Z48815 Encounter for surgical aftercare following surgery on the digestive system: Secondary | ICD-10-CM | POA: Diagnosis not present

## 2018-05-20 DIAGNOSIS — I509 Heart failure, unspecified: Secondary | ICD-10-CM | POA: Diagnosis not present

## 2018-05-20 DIAGNOSIS — E119 Type 2 diabetes mellitus without complications: Secondary | ICD-10-CM | POA: Diagnosis not present

## 2018-05-21 DIAGNOSIS — Z950 Presence of cardiac pacemaker: Secondary | ICD-10-CM | POA: Diagnosis not present

## 2018-05-21 DIAGNOSIS — Z8673 Personal history of transient ischemic attack (TIA), and cerebral infarction without residual deficits: Secondary | ICD-10-CM | POA: Diagnosis not present

## 2018-05-21 DIAGNOSIS — J45909 Unspecified asthma, uncomplicated: Secondary | ICD-10-CM | POA: Diagnosis not present

## 2018-05-21 DIAGNOSIS — G4700 Insomnia, unspecified: Secondary | ICD-10-CM | POA: Diagnosis not present

## 2018-05-21 DIAGNOSIS — Z9981 Dependence on supplemental oxygen: Secondary | ICD-10-CM | POA: Diagnosis not present

## 2018-05-21 DIAGNOSIS — E119 Type 2 diabetes mellitus without complications: Secondary | ICD-10-CM | POA: Diagnosis not present

## 2018-05-21 DIAGNOSIS — Z48815 Encounter for surgical aftercare following surgery on the digestive system: Secondary | ICD-10-CM | POA: Diagnosis not present

## 2018-05-21 DIAGNOSIS — C23 Malignant neoplasm of gallbladder: Secondary | ICD-10-CM | POA: Diagnosis not present

## 2018-05-21 DIAGNOSIS — I110 Hypertensive heart disease with heart failure: Secondary | ICD-10-CM | POA: Diagnosis not present

## 2018-05-21 DIAGNOSIS — Z86711 Personal history of pulmonary embolism: Secondary | ICD-10-CM | POA: Diagnosis not present

## 2018-05-21 DIAGNOSIS — Z86718 Personal history of other venous thrombosis and embolism: Secondary | ICD-10-CM | POA: Diagnosis not present

## 2018-05-21 DIAGNOSIS — I509 Heart failure, unspecified: Secondary | ICD-10-CM | POA: Diagnosis not present

## 2018-05-21 DIAGNOSIS — I480 Paroxysmal atrial fibrillation: Secondary | ICD-10-CM | POA: Diagnosis not present

## 2018-05-22 ENCOUNTER — Emergency Department (HOSPITAL_COMMUNITY)
Admission: EM | Admit: 2018-05-22 | Discharge: 2018-05-23 | Disposition: A | Discharge status: Home / Self Care | Payer: Medicare Other | Source: Home / Self Care | Attending: Emergency Medicine | Admitting: Emergency Medicine

## 2018-05-22 ENCOUNTER — Emergency Department (HOSPITAL_COMMUNITY): Payer: Medicare Other

## 2018-05-22 ENCOUNTER — Encounter (HOSPITAL_COMMUNITY): Payer: Self-pay | Admitting: Emergency Medicine

## 2018-05-22 ENCOUNTER — Other Ambulatory Visit: Payer: Self-pay

## 2018-05-22 DIAGNOSIS — J449 Chronic obstructive pulmonary disease, unspecified: Secondary | ICD-10-CM

## 2018-05-22 DIAGNOSIS — Z9981 Dependence on supplemental oxygen: Secondary | ICD-10-CM | POA: Diagnosis not present

## 2018-05-22 DIAGNOSIS — E785 Hyperlipidemia, unspecified: Secondary | ICD-10-CM | POA: Insufficient documentation

## 2018-05-22 DIAGNOSIS — R1030 Lower abdominal pain, unspecified: Secondary | ICD-10-CM | POA: Insufficient documentation

## 2018-05-22 DIAGNOSIS — R0902 Hypoxemia: Secondary | ICD-10-CM | POA: Diagnosis not present

## 2018-05-22 DIAGNOSIS — Z950 Presence of cardiac pacemaker: Secondary | ICD-10-CM | POA: Diagnosis not present

## 2018-05-22 DIAGNOSIS — E860 Dehydration: Secondary | ICD-10-CM | POA: Diagnosis not present

## 2018-05-22 DIAGNOSIS — R110 Nausea: Secondary | ICD-10-CM | POA: Diagnosis not present

## 2018-05-22 DIAGNOSIS — Z8589 Personal history of malignant neoplasm of other organs and systems: Secondary | ICD-10-CM | POA: Diagnosis not present

## 2018-05-22 DIAGNOSIS — E7800 Pure hypercholesterolemia, unspecified: Secondary | ICD-10-CM | POA: Diagnosis not present

## 2018-05-22 DIAGNOSIS — R1111 Vomiting without nausea: Secondary | ICD-10-CM | POA: Diagnosis not present

## 2018-05-22 DIAGNOSIS — R55 Syncope and collapse: Secondary | ICD-10-CM | POA: Diagnosis not present

## 2018-05-22 DIAGNOSIS — I110 Hypertensive heart disease with heart failure: Secondary | ICD-10-CM | POA: Insufficient documentation

## 2018-05-22 DIAGNOSIS — Z820 Family history of epilepsy and other diseases of the nervous system: Secondary | ICD-10-CM | POA: Diagnosis not present

## 2018-05-22 DIAGNOSIS — I951 Orthostatic hypotension: Secondary | ICD-10-CM | POA: Diagnosis not present

## 2018-05-22 DIAGNOSIS — Z8673 Personal history of transient ischemic attack (TIA), and cerebral infarction without residual deficits: Secondary | ICD-10-CM

## 2018-05-22 DIAGNOSIS — Z79899 Other long term (current) drug therapy: Secondary | ICD-10-CM

## 2018-05-22 DIAGNOSIS — I959 Hypotension, unspecified: Secondary | ICD-10-CM | POA: Diagnosis not present

## 2018-05-22 DIAGNOSIS — Z86718 Personal history of other venous thrombosis and embolism: Secondary | ICD-10-CM

## 2018-05-22 DIAGNOSIS — Z8249 Family history of ischemic heart disease and other diseases of the circulatory system: Secondary | ICD-10-CM | POA: Diagnosis not present

## 2018-05-22 DIAGNOSIS — Z9049 Acquired absence of other specified parts of digestive tract: Secondary | ICD-10-CM | POA: Diagnosis not present

## 2018-05-22 DIAGNOSIS — I5032 Chronic diastolic (congestive) heart failure: Secondary | ICD-10-CM

## 2018-05-22 DIAGNOSIS — N200 Calculus of kidney: Secondary | ICD-10-CM | POA: Diagnosis not present

## 2018-05-22 DIAGNOSIS — R112 Nausea with vomiting, unspecified: Secondary | ICD-10-CM | POA: Diagnosis not present

## 2018-05-22 DIAGNOSIS — J410 Simple chronic bronchitis: Secondary | ICD-10-CM | POA: Diagnosis not present

## 2018-05-22 DIAGNOSIS — K219 Gastro-esophageal reflux disease without esophagitis: Secondary | ICD-10-CM | POA: Diagnosis not present

## 2018-05-22 DIAGNOSIS — J9811 Atelectasis: Secondary | ICD-10-CM | POA: Diagnosis not present

## 2018-05-22 DIAGNOSIS — I10 Essential (primary) hypertension: Secondary | ICD-10-CM | POA: Diagnosis not present

## 2018-05-22 DIAGNOSIS — R4020 Unspecified coma: Secondary | ICD-10-CM | POA: Diagnosis not present

## 2018-05-22 DIAGNOSIS — I495 Sick sinus syndrome: Secondary | ICD-10-CM | POA: Diagnosis not present

## 2018-05-22 DIAGNOSIS — Z86711 Personal history of pulmonary embolism: Secondary | ICD-10-CM | POA: Diagnosis not present

## 2018-05-22 DIAGNOSIS — K5900 Constipation, unspecified: Secondary | ICD-10-CM | POA: Diagnosis not present

## 2018-05-22 DIAGNOSIS — R1084 Generalized abdominal pain: Secondary | ICD-10-CM | POA: Diagnosis not present

## 2018-05-22 DIAGNOSIS — I361 Nonrheumatic tricuspid (valve) insufficiency: Secondary | ICD-10-CM | POA: Diagnosis not present

## 2018-05-22 DIAGNOSIS — N183 Chronic kidney disease, stage 3 (moderate): Secondary | ICD-10-CM | POA: Diagnosis not present

## 2018-05-22 DIAGNOSIS — Z833 Family history of diabetes mellitus: Secondary | ICD-10-CM | POA: Diagnosis not present

## 2018-05-22 DIAGNOSIS — F329 Major depressive disorder, single episode, unspecified: Secondary | ICD-10-CM | POA: Diagnosis not present

## 2018-05-22 DIAGNOSIS — I482 Chronic atrial fibrillation: Secondary | ICD-10-CM

## 2018-05-22 DIAGNOSIS — R42 Dizziness and giddiness: Secondary | ICD-10-CM | POA: Diagnosis not present

## 2018-05-22 DIAGNOSIS — Z96652 Presence of left artificial knee joint: Secondary | ICD-10-CM | POA: Diagnosis not present

## 2018-05-22 DIAGNOSIS — I130 Hypertensive heart and chronic kidney disease with heart failure and stage 1 through stage 4 chronic kidney disease, or unspecified chronic kidney disease: Secondary | ICD-10-CM | POA: Diagnosis not present

## 2018-05-22 DIAGNOSIS — R262 Difficulty in walking, not elsewhere classified: Secondary | ICD-10-CM | POA: Diagnosis not present

## 2018-05-22 LAB — CBC
HCT: 45.2 % (ref 36.0–46.0)
Hemoglobin: 13.6 g/dL (ref 12.0–15.0)
MCH: 26.7 pg (ref 26.0–34.0)
MCHC: 30.1 g/dL (ref 30.0–36.0)
MCV: 88.6 fL (ref 78.0–100.0)
PLATELETS: 184 10*3/uL (ref 150–400)
RBC: 5.1 MIL/uL (ref 3.87–5.11)
RDW: 18.5 % — AB (ref 11.5–15.5)
WBC: 6.5 10*3/uL (ref 4.0–10.5)

## 2018-05-22 LAB — LIPASE, BLOOD: Lipase: 29 U/L (ref 11–51)

## 2018-05-22 LAB — COMPREHENSIVE METABOLIC PANEL
ALT: 16 U/L (ref 0–44)
ANION GAP: 10 (ref 5–15)
AST: 24 U/L (ref 15–41)
Albumin: 3.2 g/dL — ABNORMAL LOW (ref 3.5–5.0)
Alkaline Phosphatase: 94 U/L (ref 38–126)
BUN: 15 mg/dL (ref 8–23)
CO2: 28 mmol/L (ref 22–32)
CREATININE: 1.19 mg/dL — AB (ref 0.44–1.00)
Calcium: 8.8 mg/dL — ABNORMAL LOW (ref 8.9–10.3)
Chloride: 101 mmol/L (ref 98–111)
GFR calc non Af Amer: 40 mL/min — ABNORMAL LOW (ref >60)
GFR, EST AFRICAN AMERICAN: 46 mL/min — AB (ref >60)
Glucose, Bld: 170 mg/dL — ABNORMAL HIGH (ref 70–99)
Potassium: 3.7 mmol/L (ref 3.5–5.1)
SODIUM: 139 mmol/L (ref 135–145)
Total Bilirubin: 0.8 mg/dL (ref 0.3–1.2)
Total Protein: 6.4 g/dL — ABNORMAL LOW (ref 6.5–8.1)

## 2018-05-22 MED ORDER — IOHEXOL 300 MG/ML  SOLN
75.0000 mL | Freq: Once | INTRAMUSCULAR | Status: AC | PRN
  Administered 2018-05-22: 75 mL via INTRAVENOUS

## 2018-05-22 NOTE — ED Triage Notes (Signed)
Pt BIB [facility] fro home, c/o lower abdominal pain, nausea, vomiting x 1 day. Given 4mg  zofran PTA. Gallbladder removed in July 2019.

## 2018-05-22 NOTE — ED Provider Notes (Signed)
[facility] EMERGENCY DEPARTMENT Provider Note   CSN: [id number] Arrival date & time: 05/22/18  2151     History   Chief Complaint Chief Complaint  Patient presents with  . Abdominal Pain    HPI Raven Gray is a 84 y.o. female.  The history is provided by the patient and medical records.  Abdominal Pain   Associated symptoms include nausea and vomiting.    84 y.o. F with hx of AFIB not on anticoagulation, CHF, COPD on PRN O2, HTN, HLP, prior stroke, tachy-brady syndrome s/p pacemaker placement, presenting the ED for abdominal pain.  Family reports this has been ongoing for quite some time, but got acutely worse today around 3pm.  She reports some nausea and vomiting.  No fever/chills.  No diarrhea, BM's have been normal.  No melena or hematochezia.  States she has been able to eat/drink fine.  No urinary symptoms.  Prior abdominal surgeries include cholecystectomy and hysterectomy.    Family has pulled me aside after initial exam-- daughter reports that gallbladder removed in July, was told it was cancerous.  States she was scheduled for CT scan in October for some follow-up.  States patient knows gallbladder was abnormal, but not the full extent.  Past Medical History:  Diagnosis Date  . Atrial fibrillation ([place])    off AC due to falls  . Chest pain   . CHF (congestive heart failure) ([place])   . Cholecystitis 03/2018  . COPD (chronic obstructive pulmonary disease) (HCC)    wears prn home O2  . Dementia   . H/O echocardiogram    a. 08/2016: EF of 60-65%, severely dilated LA, mild pulmonic regurgitations, mild TR, and PA Pressure at 38 mm Hg  . History of depression   . History of pulmonary embolism   . Hyperlipidemia   . Hypertension   . Hypokalemia    Now improved with treatment  . Pre-diabetes   . Shortness of breath   . 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 Tachy-brady syndrome ([place]) 04/01/2017   s/p MDT PPM     Patient Active Problem  List   Diagnosis Date Noted  . 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 Acute cholecystitis 03/29/2018  . Unilateral primary osteoarthritis, left knee   . Palliative care by specialist   . Epistaxis   . Syncope 11/02/2017  . Nasal fracture 11/02/2017  . Fall at home 11/02/2017  . COPD with emphysema ([place]) 11/02/2017  . HLD (hyperlipidemia) 11/02/2017  . History of pulmonary embolism 07/21/2017  . Nonsustained ventricular tachycardia ([place]) 07/21/2017  . Healthcare-associated pneumonia 07/01/2017  . Acute respiratory failure with hypoxia ([place]) 07/01/2017  . Acute diastolic heart failure ([place]) 07/01/2017  . Conjunctivitis 07/01/2017  . Sepsis ([place]) 07/01/2017  . HCAP (healthcare-associated pneumonia)   . Acute pulmonary edema (HCC)   . Tachycardia-bradycardia syndrome ([place]) 04/01/2017  . Pacemaker 04/01/2017  . Chronic atrial fibrillation ([place])   . Syncope and collapse   . Loss of consciousness ([place]) 01/08/2017  . Dehydration 11/25/2016  . Acute encephalopathy   . Encephalopathy 11/24/2016  . Dyspnea on exertion 10/29/2016  . Chronic respiratory failure with hypoxia ([place]) 10/29/2016  . TIA (transient ischemic attack) 09/06/2016  . COPD (chronic obstructive pulmonary disease) ([place]) 09/06/2016  . Essential hypertension 09/06/2016  . GERD (gastroesophageal reflux disease) 09/06/2016  . Depression 09/06/2016  . Aortic insufficiency 12/15/2014  . Edema of both legs 09/19/2014  . Chest pain 11/21/2013  .  Community acquired pneumonia 06/19/2012  . Pulmonary embolism ([place]) 06/19/2012  . Hypokalemia 06/18/2012  . COPD with acute exacerbation ([place]) 06/18/2012  . Hypoxia 06/18/2012    Past Surgical History:  Procedure Laterality Date  . ABDOMINAL HYSTERECTOMY    . BACK SURGERY    . CHOLECYSTECTOMY N/A 03/29/2018   Procedure: LAPAROSCOPIC CHOLECYSTECTOMY WITH INTRAOPERATIVE CHOLANGIOGRAM;  Surgeon: Alphonsa Overall, MD;  Location: [facility];  Service: General;  Laterality:  N/A;  . PACEMAKER IMPLANT N/A 04/01/2017   Procedure: Pacemaker Implant;  Surgeon: Sanda Klein, MD;  Location: [place] CV LAB;  Service: Cardiovascular;  Laterality: N/A; MDT Azure XT SR MRI  . REPLACEMENT TOTAL KNEE    . ROTATOR CUFF REPAIR       OB History   None      Home Medications    Prior to Admission medications   Medication Sig Start Date End Date Taking? Authorizing Provider  acetaminophen (TYLENOL) 325 MG tablet Take 650 mg by mouth every 6 (six) hours as needed (for pain).     [provider]  ALPRAZolam Duanne Moron) 0.25 MG tablet Take 1 tablet (0.25 mg total) by mouth 3 (three) times daily as needed for anxiety (anxiety). 04/01/18   Florencia Reasons, MD  furosemide (LASIX) 40 MG tablet Take 1 tablet (40 mg total) by mouth daily. Take 1 tablet (40 mg) two times a day for 3 days. Then 1 tablet by mouth daily. Patient taking differently: Take 20 mg by mouth daily. Take 1 tablet (40 mg) two times a day for 3 days. Then 1 tablet by mouth daily. 04/01/18   Florencia Reasons, MD  MAGNESIUM PO Take 1 tablet by mouth daily.     [provider]  MELATONIN PO Take 1 tablet by mouth at bedtime.     [provider]  metoprolol tartrate (LOPRESSOR) 25 MG tablet Take 1 tablet (25 mg total) by mouth daily. 03/01/18   Barrett, Evelene Croon, PA-C  montelukast (SINGULAIR) 10 MG tablet Take 10 mg by mouth at bedtime.    [provider]  Multiple Vitamins-Minerals (WOMENS 50+ MULTI VITAMIN/MIN) TABS Take 1 tablet by mouth daily.    [provider]  nitroGLYCERIN (NITROSTAT) 0.4 MG SL tablet Place 0.4 mg under the tongue every 5 (five) minutes as needed for chest pain. X 3 doses 02/08/16   [provider]  omeprazole (PRILOSEC) 20 MG capsule Take 20 mg by mouth daily as needed (for reflux/heartburn).  04/23/15   [provider]  OXYGEN Inhale 2 L into the lungs daily as needed (oxygen).     [provider]  potassium chloride SA (K-DUR,KLOR-CON) 20  MEQ tablet Take 1 tablet (20 mEq total) by mouth daily as needed (along with lasix.). Patient taking differently: Take 20 mEq by mouth daily.  11/09/17   Lavina Hamman, MD  pravastatin (PRAVACHOL) 40 MG tablet Take 40 mg by mouth every morning.     [provider]  traZODone (DESYREL) 50 MG tablet Take 1 tablet by mouth at bedtime. 02/24/18   [provider]  venlafaxine XR (EFFEXOR-XR) 150 MG 24 hr capsule Take 225 mg by mouth daily.     [provider]  venlafaxine XR (EFFEXOR-XR) 75 MG 24 hr capsule Take 225 mg by mouth daily.  07/14/17   [provider]    Family History Family History  Problem Relation Age of Onset  . Alzheimer's disease Mother   . Heart attack Father   . Diabetes type II  Daughter   . Heart disease Daughter        stents    Social History Social History   Tobacco Use  . Smoking status: Never Smoker  . Smokeless tobacco: Never Used  Substance Use Topics  . Alcohol use: No  . Drug use: No     Allergies   Patient has no known allergies.   Review of Systems Review of Systems  Gastrointestinal: Positive for abdominal pain, nausea and vomiting.  All other systems reviewed and are negative.    Physical Exam Updated Vital Signs BP 129/66 (BP Location: Right Arm)   Pulse 68   Resp 20   SpO2 92%   Physical Exam  Constitutional: She is oriented to person, place, and time. She appears well-developed and well-nourished.  HENT:  Head: Normocephalic and atraumatic.  Mouth/Throat: Oropharynx is clear and moist.  Eyes: Pupils are equal, round, and reactive to light. Conjunctivae and EOM are normal.  Neck: Normal range of motion.  Cardiovascular: Normal rate, regular rhythm and normal heart sounds.  Pulmonary/Chest: Effort normal and breath sounds normal.  Abdominal: Soft. Bowel sounds are normal. There is tenderness in the suprapubic area and left lower quadrant. There is no rigidity and no guarding.  Musculoskeletal:  Normal range of motion.  Neurological: She is alert and oriented to person, place, and time.  Skin: Skin is warm and dry.  Psychiatric: She has a normal mood and affect.  Nursing note and vitals reviewed.    ED Treatments / Results  Labs (all labs ordered are listed, but only abnormal results are displayed) Labs Reviewed  COMPREHENSIVE METABOLIC PANEL - Abnormal; Notable for the following components:      Result Value   Glucose, Bld 170 (*)    Creatinine, Ser 1.19 (*)    Calcium 8.8 (*)    Total Protein 6.4 (*)    Albumin 3.2 (*)    GFR calc non Af Amer 40 (*)    GFR calc Af Amer 46 (*)    All other components within normal limits  CBC - Abnormal; Notable for the following components:   RDW 18.5 (*)    All other components within normal limits  URINE CULTURE  LIPASE, BLOOD  URINALYSIS, ROUTINE W REFLEX MICROSCOPIC    EKG None  Radiology Ct Abdomen Pelvis W Contrast  Result Date: 05/23/2018 CLINICAL DATA:  Lower abdominal pain, nausea and vomiting for 1 day. Status post cholecystectomy July 2019. History of hysterectomy. EXAM: CT ABDOMEN AND PELVIS WITH CONTRAST TECHNIQUE: Multidetector CT imaging of the abdomen and pelvis was performed using the standard protocol following bolus administration of intravenous contrast. CONTRAST:  43mL OMNIPAQUE IOHEXOL 300 MG/ML  SOLN COMPARISON:  CTA abdomen and pelvis March 29, 2018 FINDINGS: Mild motion degraded examination. LOWER CHEST: Lung bases are clear. Stable cardiomegaly. No pericardial effusion. Pacemaker wires in place. No pericardial effusion. HEPATOBILIARY: Subcentimeter hypodensities in the liver most compatible with cysts. No intrahepatic biliary dilatation. Status post cholecystectomy. No fluid collections within gallbladder fossa. PANCREAS: Normal. SPLEEN: Normal. ADRENALS/URINARY TRACT: Kidneys are orthotopic, demonstrating symmetric enhancement. Mildly atrophic LEFT kidney. Bilateral renal scarring. 6 mm RIGHT interpolar  nephrolithiasis. No hydronephrosis or solid renal masses. 2.8 cm homogeneously hypodense benign-appearing cyst RIGHT upper pole. 14 mm RIGHT lower pole cyst. RIGHT parapelvic cysts. The unopacified ureters are normal in course and caliber. Delayed imaging through the kidneys demonstrates symmetric prompt contrast excretion within the proximal urinary collecting system. Urinary bladder is partially distended and unremarkable. Normal adrenal glands.  STOMACH/BOWEL: Large hiatal hernia. Small large bowel are normal in caliber. Mobile cecum located in central abdomen with normal appendix RIGHT upper quadrant. Moderate retained large bowel stool. Mild diverticulosis. VASCULAR/LYMPHATIC: Stable 2.6 cm ectatic aorta. Moderate calcific atherosclerosis. No lymphadenopathy by CT size criteria. REPRODUCTIVE: Status post hysterectomy. OTHER: No intraperitoneal free fluid or free air. MUSCULOSKELETAL: Nonacute. Thoracolumbar dextroscoliosis, L4-5 interspinous prosthesis. Severe RIGHT L5-S1 neural foraminal narrowing. Mild old L2 compression fracture and severe degenerative change of the lumbar spine. IMPRESSION: 1. No acute intra-abdominal or pelvic process on this motion degraded examination. Moderate retained large bowel stool. 2. 6 mm non-obstructing RIGHT nephrolithiasis. Mildly atrophic LEFT kidney. 3. Status post cholecystectomy without complication. Status post hysterectomy. 4. Stable 2.6 cm ectatic infrarenal aorta at risk for aneurysm development. Recommend followup by ultrasound in 5 years. This recommendation follows ACR consensus guidelines: White Paper of the ACR Incidental Findings Committee II on Vascular Findings. J Am Coll Radiol 2013; 10:789-794. Aortic Atherosclerosis (ICD10-I70.0). Electronically Signed   By: Elon Alas M.D.   On: 05/23/2018 00:21    Procedures Procedures (including critical care time)  Medications Ordered in ED Medications - No data to display   Initial Impression /  Assessment and Plan / ED Course  I have reviewed the triage vital signs and the nursing notes.  Pertinent labs & imaging results that were available during my care of the patient were reviewed by me and considered in my medical decision making (see chart for details).  84 year old female presenting to the ED with lower abdominal pain.  Episode of nausea and vomiting earlier today.  She is afebrile and nontoxic in appearance.  Tenderness in the suprapubic and left lower abdominal region without peritoneal signs.  Labs are overall reassuring, normal white blood cell count.  CT scan obtained without acute findings aside from moderate stool burden.  Patient has remained comfortable here, has not required any pain medication.  She had dose of Zofran with EMS but has not had any recurrent nausea since then.  She is tolerating oral fluids well at this time.  Feel she is stable for discharge.  I recommended initiating MiraLAX to help with bowel movements.  Close follow-up with PCP.  Return here for any new or worsening symptoms.  Final Clinical Impressions(s) / ED Diagnoses   Final diagnoses:  Lower abdominal pain    ED Discharge Orders         Ordered    polyethylene glycol powder (GLYCOLAX/MIRALAX) powder  2 times daily     05/23/18 0107    ondansetron (ZOFRAN ODT) 4 MG disintegrating tablet  Every 8 hours PRN     05/23/18 0108           Larene Pickett, PA-C 05/23/18 0409    Tegeler, Gwenyth Allegra, MD 05/23/18 1054

## 2018-05-23 ENCOUNTER — Emergency Department (HOSPITAL_COMMUNITY): Payer: Medicare Other

## 2018-05-23 ENCOUNTER — Encounter (HOSPITAL_COMMUNITY): Payer: Self-pay

## 2018-05-23 ENCOUNTER — Other Ambulatory Visit: Payer: Self-pay

## 2018-05-23 ENCOUNTER — Inpatient Hospital Stay (HOSPITAL_COMMUNITY)
Admission: EM | Admit: 2018-05-23 | Discharge: 2018-05-26 | DRG: 312 | Disposition: A | Discharge status: Home Health | Payer: Medicare Other | Attending: Internal Medicine | Admitting: Internal Medicine

## 2018-05-23 DIAGNOSIS — R262 Difficulty in walking, not elsewhere classified: Secondary | ICD-10-CM

## 2018-05-23 DIAGNOSIS — R7303 Prediabetes: Secondary | ICD-10-CM | POA: Diagnosis present

## 2018-05-23 DIAGNOSIS — Z9981 Dependence on supplemental oxygen: Secondary | ICD-10-CM

## 2018-05-23 DIAGNOSIS — Z833 Family history of diabetes mellitus: Secondary | ICD-10-CM

## 2018-05-23 DIAGNOSIS — R55 Syncope and collapse: Secondary | ICD-10-CM | POA: Diagnosis present

## 2018-05-23 DIAGNOSIS — Z96652 Presence of left artificial knee joint: Secondary | ICD-10-CM | POA: Diagnosis present

## 2018-05-23 DIAGNOSIS — I130 Hypertensive heart and chronic kidney disease with heart failure and stage 1 through stage 4 chronic kidney disease, or unspecified chronic kidney disease: Secondary | ICD-10-CM | POA: Diagnosis present

## 2018-05-23 DIAGNOSIS — I719 Aortic aneurysm of unspecified site, without rupture: Secondary | ICD-10-CM | POA: Diagnosis present

## 2018-05-23 DIAGNOSIS — Z8249 Family history of ischemic heart disease and other diseases of the circulatory system: Secondary | ICD-10-CM

## 2018-05-23 DIAGNOSIS — I4510 Unspecified right bundle-branch block: Secondary | ICD-10-CM | POA: Diagnosis present

## 2018-05-23 DIAGNOSIS — E876 Hypokalemia: Secondary | ICD-10-CM | POA: Diagnosis not present

## 2018-05-23 DIAGNOSIS — K219 Gastro-esophageal reflux disease without esophagitis: Secondary | ICD-10-CM | POA: Diagnosis present

## 2018-05-23 DIAGNOSIS — I482 Chronic atrial fibrillation, unspecified: Secondary | ICD-10-CM | POA: Diagnosis present

## 2018-05-23 DIAGNOSIS — I5033 Acute on chronic diastolic (congestive) heart failure: Secondary | ICD-10-CM

## 2018-05-23 DIAGNOSIS — Z8589 Personal history of malignant neoplasm of other organs and systems: Secondary | ICD-10-CM

## 2018-05-23 DIAGNOSIS — Z8673 Personal history of transient ischemic attack (TIA), and cerebral infarction without residual deficits: Secondary | ICD-10-CM

## 2018-05-23 DIAGNOSIS — F419 Anxiety disorder, unspecified: Secondary | ICD-10-CM | POA: Diagnosis present

## 2018-05-23 DIAGNOSIS — I5032 Chronic diastolic (congestive) heart failure: Secondary | ICD-10-CM | POA: Diagnosis present

## 2018-05-23 DIAGNOSIS — R4020 Unspecified coma: Secondary | ICD-10-CM | POA: Diagnosis present

## 2018-05-23 DIAGNOSIS — N183 Chronic kidney disease, stage 3 (moderate): Secondary | ICD-10-CM | POA: Diagnosis present

## 2018-05-23 DIAGNOSIS — Z90710 Acquired absence of both cervix and uterus: Secondary | ICD-10-CM

## 2018-05-23 DIAGNOSIS — Z9049 Acquired absence of other specified parts of digestive tract: Secondary | ICD-10-CM

## 2018-05-23 DIAGNOSIS — Z820 Family history of epilepsy and other diseases of the nervous system: Secondary | ICD-10-CM

## 2018-05-23 DIAGNOSIS — Z950 Presence of cardiac pacemaker: Secondary | ICD-10-CM | POA: Diagnosis present

## 2018-05-23 DIAGNOSIS — E785 Hyperlipidemia, unspecified: Secondary | ICD-10-CM | POA: Diagnosis present

## 2018-05-23 DIAGNOSIS — N200 Calculus of kidney: Secondary | ICD-10-CM | POA: Diagnosis present

## 2018-05-23 DIAGNOSIS — Z66 Do not resuscitate: Secondary | ICD-10-CM | POA: Diagnosis present

## 2018-05-23 DIAGNOSIS — I951 Orthostatic hypotension: Principal | ICD-10-CM | POA: Diagnosis present

## 2018-05-23 DIAGNOSIS — F0390 Unspecified dementia without behavioral disturbance: Secondary | ICD-10-CM | POA: Diagnosis present

## 2018-05-23 DIAGNOSIS — Z86711 Personal history of pulmonary embolism: Secondary | ICD-10-CM | POA: Diagnosis present

## 2018-05-23 DIAGNOSIS — I10 Essential (primary) hypertension: Secondary | ICD-10-CM | POA: Diagnosis present

## 2018-05-23 DIAGNOSIS — E860 Dehydration: Secondary | ICD-10-CM | POA: Diagnosis present

## 2018-05-23 DIAGNOSIS — F329 Major depressive disorder, single episode, unspecified: Secondary | ICD-10-CM | POA: Diagnosis present

## 2018-05-23 DIAGNOSIS — J449 Chronic obstructive pulmonary disease, unspecified: Secondary | ICD-10-CM | POA: Diagnosis present

## 2018-05-23 DIAGNOSIS — K5900 Constipation, unspecified: Secondary | ICD-10-CM | POA: Diagnosis present

## 2018-05-23 DIAGNOSIS — I495 Sick sinus syndrome: Secondary | ICD-10-CM | POA: Diagnosis present

## 2018-05-23 DIAGNOSIS — F32A Depression, unspecified: Secondary | ICD-10-CM | POA: Diagnosis present

## 2018-05-23 LAB — CBC WITH DIFFERENTIAL/PLATELET
ABS IMMATURE GRANULOCYTES: 0 10*3/uL (ref 0.0–0.1)
Basophils Absolute: 0.1 10*3/uL (ref 0.0–0.1)
Basophils Relative: 1 %
EOS ABS: 0.3 10*3/uL (ref 0.0–0.7)
Eosinophils Relative: 5 %
HEMATOCRIT: 42.3 % (ref 36.0–46.0)
Hemoglobin: 12.8 g/dL (ref 12.0–15.0)
IMMATURE GRANULOCYTES: 0 %
LYMPHS ABS: 1.6 10*3/uL (ref 0.7–4.0)
Lymphocytes Relative: 27 %
MCH: 27 pg (ref 26.0–34.0)
MCHC: 30.3 g/dL (ref 30.0–36.0)
MCV: 89.2 fL (ref 78.0–100.0)
MONOS PCT: 9 %
Monocytes Absolute: 0.6 10*3/uL (ref 0.1–1.0)
NEUTROS PCT: 58 %
Neutro Abs: 3.4 10*3/uL (ref 1.7–7.7)
Platelets: 151 10*3/uL (ref 150–400)
RBC: 4.74 MIL/uL (ref 3.87–5.11)
RDW: 18.4 % — ABNORMAL HIGH (ref 11.5–15.5)
WBC: 5.9 10*3/uL (ref 4.0–10.5)

## 2018-05-23 LAB — URINALYSIS, ROUTINE W REFLEX MICROSCOPIC
Bilirubin Urine: NEGATIVE
GLUCOSE, UA: NEGATIVE mg/dL
Hgb urine dipstick: NEGATIVE
KETONES UR: NEGATIVE mg/dL
LEUKOCYTES UA: NEGATIVE
Nitrite: NEGATIVE
PROTEIN: NEGATIVE mg/dL
Specific Gravity, Urine: 1.013 (ref 1.005–1.030)
pH: 6 (ref 5.0–8.0)

## 2018-05-23 LAB — BASIC METABOLIC PANEL
ANION GAP: 7 (ref 5–15)
BUN: 13 mg/dL (ref 8–23)
CO2: 28 mmol/L (ref 22–32)
Calcium: 8.2 mg/dL — ABNORMAL LOW (ref 8.9–10.3)
Chloride: 105 mmol/L (ref 98–111)
Creatinine, Ser: 1.02 mg/dL — ABNORMAL HIGH (ref 0.44–1.00)
GFR calc Af Amer: 55 mL/min — ABNORMAL LOW (ref >60)
GFR calc non Af Amer: 48 mL/min — ABNORMAL LOW (ref >60)
GLUCOSE: 124 mg/dL — AB (ref 70–99)
Potassium: 4.2 mmol/L (ref 3.5–5.1)
Sodium: 140 mmol/L (ref 135–145)

## 2018-05-23 LAB — TROPONIN I: Troponin I: <0.03 ng/mL (ref <0.03)

## 2018-05-23 MED ORDER — SODIUM CHLORIDE 0.9 % IV SOLN: INTRAVENOUS | Status: DC

## 2018-05-23 MED ORDER — TRAZODONE HCL 50 MG PO TABS
50.0000 mg | ORAL_TABLET | Freq: Every day | ORAL | Status: DC
  Administered 2018-05-23 – 2018-05-25 (×3): 50 mg via ORAL
  Filled 2018-05-23 (×3): qty 1

## 2018-05-23 MED ORDER — PANTOPRAZOLE SODIUM 40 MG PO TBEC
40.0000 mg | DELAYED_RELEASE_TABLET | Freq: Every day | ORAL | Status: DC
  Administered 2018-05-23 – 2018-05-26 (×4): 40 mg via ORAL
  Filled 2018-05-23 (×4): qty 1

## 2018-05-23 MED ORDER — ALPRAZOLAM 0.25 MG PO TABS
0.2500 mg | ORAL_TABLET | Freq: Three times a day (TID) | ORAL | Status: DC | PRN
  Administered 2018-05-23 – 2018-05-25 (×3): 0.25 mg via ORAL
  Filled 2018-05-23 (×3): qty 1

## 2018-05-23 MED ORDER — OXYCODONE HCL 5 MG PO TABS: 5.0000 mg | ORAL_TABLET | Freq: Four times a day (QID) | ORAL | Status: DC | PRN

## 2018-05-23 MED ORDER — ADULT MULTIVITAMIN W/MINERALS CH
1.0000 | ORAL_TABLET | Freq: Every day | ORAL | Status: DC
  Administered 2018-05-24 – 2018-05-26 (×3): 1 via ORAL
  Filled 2018-05-23 (×3): qty 1

## 2018-05-23 MED ORDER — POLYETHYLENE GLYCOL 3350 17 GM/SCOOP PO POWD: 17.0000 g | Freq: Two times a day (BID) | ORAL | 0 refills | Status: AC

## 2018-05-23 MED ORDER — ONDANSETRON 4 MG PO TBDP: 4.0000 mg | ORAL_TABLET | Freq: Three times a day (TID) | ORAL | 0 refills | Status: AC | PRN

## 2018-05-23 MED ORDER — VENLAFAXINE HCL ER 75 MG PO CP24
150.0000 mg | ORAL_CAPSULE | Freq: Every day | ORAL | Status: DC
  Administered 2018-05-24 – 2018-05-26 (×3): 150 mg via ORAL
  Filled 2018-05-23 (×3): qty 2

## 2018-05-23 MED ORDER — MONTELUKAST SODIUM 10 MG PO TABS
10.0000 mg | ORAL_TABLET | Freq: Every day | ORAL | Status: DC
  Administered 2018-05-23 – 2018-05-25 (×3): 10 mg via ORAL
  Filled 2018-05-23 (×3): qty 1

## 2018-05-23 MED ORDER — PRAVASTATIN SODIUM 40 MG PO TABS
40.0000 mg | ORAL_TABLET | Freq: Every day | ORAL | Status: DC
  Administered 2018-05-24 – 2018-05-26 (×3): 40 mg via ORAL
  Filled 2018-05-23 (×3): qty 1

## 2018-05-23 MED ORDER — ACETAMINOPHEN 325 MG PO TABS
650.0000 mg | ORAL_TABLET | Freq: Four times a day (QID) | ORAL | Status: DC | PRN
  Administered 2018-05-24: 650 mg via ORAL
  Filled 2018-05-23: qty 2

## 2018-05-23 MED ORDER — SENNOSIDES-DOCUSATE SODIUM 8.6-50 MG PO TABS
1.0000 | ORAL_TABLET | Freq: Two times a day (BID) | ORAL | Status: DC
  Administered 2018-05-23 – 2018-05-26 (×6): 1 via ORAL
  Filled 2018-05-23 (×6): qty 1

## 2018-05-23 MED ORDER — POLYETHYLENE GLYCOL 3350 17 G PO PACK
17.0000 g | PACK | Freq: Every day | ORAL | Status: DC
  Administered 2018-05-23 – 2018-05-25 (×3): 17 g via ORAL
  Filled 2018-05-23 (×3): qty 1

## 2018-05-23 MED ORDER — VENLAFAXINE HCL ER 75 MG PO CP24
75.0000 mg | ORAL_CAPSULE | Freq: Every day | ORAL | Status: DC
  Administered 2018-05-25 – 2018-05-26 (×2): 75 mg via ORAL
  Filled 2018-05-23 (×3): qty 1

## 2018-05-23 MED ORDER — ENOXAPARIN SODIUM 40 MG/0.4ML ~~LOC~~ SOLN
40.0000 mg | SUBCUTANEOUS | Status: DC
  Administered 2018-05-24 – 2018-05-26 (×3): 40 mg via SUBCUTANEOUS
  Filled 2018-05-23 (×3): qty 0.4

## 2018-05-23 MED ORDER — SODIUM CHLORIDE 0.9 % IV SOLN
INTRAVENOUS | Status: AC
  Administered 2018-05-23 (×2): via INTRAVENOUS

## 2018-05-23 MED ORDER — VENLAFAXINE HCL ER 75 MG PO CP24: 75.0000 mg | ORAL_CAPSULE | ORAL | Status: DC

## 2018-05-23 NOTE — ED Provider Notes (Signed)
[facility] EMERGENCY DEPARTMENT Provider Note   CSN: [id number] Arrival date & time: 05/23/18  1425     History   Chief Complaint Chief Complaint  Patient presents with  . Loss of Consciousness    HPI PRETTY WELTMAN is a 84 y.o. female with a past medical history of A. fib not currently anticoagulated, CHF, COPD on as needed oxygen, hypertension, hyperlipidemia, prior CVA, tachybradycardia syndrome status post pacemaker placement, history of PE, who presents to ED for evaluation of syncopal episode that occurred prior to arrival.  Patient was seen and evaluated in the ED last night.  She was having severe lower abdominal pain.  CT of abdomen pelvis showed moderate stool burden with no other acute findings.  She was given a prescription for MiraLAX.  She was sitting on her front porch in a rocking chair.  She began taking several sips of at the [facility] when family member states that she "became unresponsive."  They state that she was going in and out of consciousness for about 7 minutes.  Patient recalls being in the rocking chair and does recall when EMS came to evaluate her.  She currently complains of still having lower abdominal pain.  She does not believe that her diagnosis of constipation was correct.  She has been stating for the past 3 days that bilateral fingertips have paresthesias.  She also reports telling her family member 3 days ago that "my entire body was numb."  They state that she uses her wheelchair most of the time but will occasionally use a walker for ambulation.  She denies any chest pain, shortness of breath, fever, urinary symptoms, hematochezia or melena, injuries or falls, headache, vision changes, extremity weakness, leg swelling, hemoptysis.  HPI  Past Medical History:  Diagnosis Date  . Atrial fibrillation ([place])    off AC due to falls  . Chest pain   . CHF (congestive heart failure) ([place])   . Cholecystitis 03/2018  . COPD (chronic obstructive  pulmonary disease) (H[facility])    wears prn home O2  . Dementia   . H/O echocardiogram    a. 08/2016: EF of 60-65%, severely dilated LA, mild pulmonic regurgitations, mild TR, and PA Pressure at 38 mm Hg  . History of depression   . History of pulmonary embolism   . Hyperlipidemia   . Hypertension   . Hypokalemia    Now improved with treatment  . Pre-diabetes   . Shortness of breath   . 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 Tachy-brady syndrome ([place]) 04/01/2017   s/p MDT PPM     Patient Active Problem List   Diagnosis Date Noted  . Pre-syncope 05/23/2018  . Primary gall bladder adenocarcinoma ([place]) 04/18/2018  . Acute cholecystitis 03/29/2018  . Unilateral primary osteoarthritis, left knee   . Palliative care by specialist   . Epistaxis   . Syncope 11/02/2017  . Nasal fracture 11/02/2017  . Fall at home 11/02/2017  . COPD with emphysema ([place]) 11/02/2017  . HLD (hyperlipidemia) 11/02/2017  . History of pulmonary embolism 07/21/2017  . Nonsustained ventricular tachycardia ([place]) 07/21/2017  . Healthcare-associated pneumonia 07/01/2017  . Acute respiratory failure with hypoxia ([place]) 07/01/2017  . Acute diastolic heart failure ([place]) 07/01/2017  . Conjunctivitis 07/01/2017  . Sepsis ([place]) 07/01/2017  . HCAP (healthcare-associated pneumonia)   . Acute pulmonary edema (HCC)   . Tachycardia-bradycardia syndrome ([place]) 04/01/2017  . Pacemaker 04/01/2017  . Chronic atrial fibrillation ([place])   .  Syncope and collapse   . Loss of consciousness ([place]) 01/08/2017  . Dehydration 11/25/2016  . Acute encephalopathy   . Encephalopathy 11/24/2016  . Dyspnea on exertion 10/29/2016  . Chronic respiratory failure with hypoxia ([place]) 10/29/2016  . TIA (transient ischemic attack) 09/06/2016  . COPD (chronic obstructive pulmonary disease) ([place]) 09/06/2016  . Essential hypertension 09/06/2016  . GERD (gastroesophageal reflux disease) 09/06/2016  . Depression 09/06/2016  . Aortic  insufficiency 12/15/2014  . Edema of both legs 09/19/2014  . Chest pain 11/21/2013  . Community acquired pneumonia 06/19/2012  . Pulmonary embolism ([place]) 06/19/2012  . Hypokalemia 06/18/2012  . COPD with acute exacerbation ([place]) 06/18/2012  . Hypoxia 06/18/2012    Past Surgical History:  Procedure Laterality Date  . ABDOMINAL HYSTERECTOMY    . BACK SURGERY    . CHOLECYSTECTOMY N/A 03/29/2018   Procedure: LAPAROSCOPIC CHOLECYSTECTOMY WITH INTRAOPERATIVE CHOLANGIOGRAM;  Surgeon: Alphonsa Overall, MD;  Location: [facility];  Service: General;  Laterality: N/A;  . PACEMAKER IMPLANT N/A 04/01/2017   Procedure: Pacemaker Implant;  Surgeon: Sanda Klein, MD;  Location: [facility] CV LAB;  Service: Cardiovascular;  Laterality: N/A; MDT Azure XT SR MRI  . REPLACEMENT TOTAL KNEE    . ROTATOR CUFF REPAIR       OB History   None      Home Medications    Prior to Admission medications   Medication Sig Start Date End Date Taking? Authorizing Provider  acetaminophen (TYLENOL) 325 MG tablet Take 650 mg by mouth every 6 (six) hours as needed (for pain).    Yes [provider]  ALPRAZolam (XANAX) 0.25 MG tablet Take 1 tablet (0.25 mg total) by mouth 3 (three) times daily as needed for anxiety (anxiety). Patient taking differently: Take 0.25 mg by mouth 3 (three) times daily as needed for anxiety.  04/01/18  Yes Florencia Reasons, MD  furosemide (LASIX) 40 MG tablet Take 1 tablet (40 mg total) by mouth daily. Take 1 tablet (40 mg) two times a day for 3 days. Then 1 tablet by mouth daily. Patient taking differently: Take 20 mg by mouth daily.  04/01/18  Yes Florencia Reasons, MD  MAGNESIUM PO Take 1 tablet by mouth at bedtime.    Yes [provider]  MELATONIN PO Take 1 tablet by mouth at bedtime as needed (sleep).    Yes [provider]  metoprolol tartrate (LOPRESSOR) 25 MG tablet Take 1 tablet (25 mg total) by mouth daily. 03/01/18  Yes Barrett, Evelene Croon, PA-C  montelukast (SINGULAIR) 10 MG  tablet Take 10 mg by mouth at bedtime.   Yes [provider]  Multiple Vitamin (MULTIVITAMIN WITH MINERALS) TABS tablet Take 1 tablet by mouth daily.   Yes [provider]  nitroGLYCERIN (NITROSTAT) 0.4 MG SL tablet Place 0.4 mg under the tongue every 5 (five) minutes as needed for chest pain. X 3 doses 02/08/16  Yes [provider]  omeprazole (PRILOSEC) 20 MG capsule Take 20 mg by mouth daily as needed (for reflux/heartburn).  04/23/15  Yes [provider]  ondansetron (ZOFRAN ODT) 4 MG disintegrating tablet Take 1 tablet (4 mg total) by mouth every 8 (eight) hours as needed for nausea. 05/23/18  Yes Larene Pickett, PA-C  OXYGEN Inhale 2 L into the lungs daily as needed (shortness of breath).    Yes [provider]  polyethylene glycol powder (GLYCOLAX/MIRALAX) powder Take 17 g by mouth 2 (two) times daily. Until daily soft stools OTC 05/23/18  Yes Quincy Carnes  M, PA-C  potassium chloride SA (K-DUR,KLOR-CON) 20 MEQ tablet Take 1 tablet (20 mEq total) by mouth daily as needed (along with lasix.). Patient taking differently: Take 20 mEq by mouth daily.  11/09/17  Yes Lavina Hamman, MD  pravastatin (PRAVACHOL) 40 MG tablet Take 40 mg by mouth daily.    Yes [provider]  traZODone (DESYREL) 50 MG tablet Take 50 mg by mouth at bedtime.  02/24/18  Yes [provider]  venlafaxine XR (EFFEXOR-XR) 150 MG 24 hr capsule Take 150 mg by mouth See admin instructions. Take one capsule (150 mg) by mouth with a 75 mg capsule for a total dose of 225 mg daily   Yes [provider]  venlafaxine XR (EFFEXOR-XR) 75 MG 24 hr capsule Take 75 mg by mouth See admin instructions. Take one capsule (75 mg) by mouth with a 150 mg capsule for a total dose of 225 mg daily 07/14/17  Yes [provider]    Family History Family History  Problem Relation Age of Onset  . Alzheimer's disease Mother   . Heart attack Father   . Diabetes type II Daughter     . Heart disease Daughter        stents    Social History Social History   Tobacco Use  . Smoking status: Never Smoker  . Smokeless tobacco: Never Used  Substance Use Topics  . Alcohol use: No  . Drug use: No     Allergies   Patient has no known allergies.   Review of Systems Review of Systems  Constitutional: Negative for appetite change, chills and fever.  HENT: Negative for ear pain, rhinorrhea, sneezing and sore throat.   Eyes: Negative for photophobia and visual disturbance.  Respiratory: Negative for cough, chest tightness, shortness of breath and wheezing.   Cardiovascular: Negative for chest pain and palpitations.  Gastrointestinal: Positive for nausea. Negative for abdominal pain, blood in stool, constipation, diarrhea and vomiting.  Genitourinary: Negative for dysuria, hematuria and urgency.  Musculoskeletal: Negative for myalgias.  Skin: Negative for rash.  Neurological: Positive for syncope. Negative for dizziness, weakness, light-headedness and numbness.     Physical Exam Updated Vital Signs BP 125/75   Pulse 62   Temp 97.8 F (36.6 C) (Oral)   Resp 15   Ht 5\' 3"  (1.6 m)   Wt 71.2 kg   SpO2 96%   BMI 27.81 kg/m   Physical Exam  Constitutional: She appears well-developed and well-nourished. No distress.  HENT:  Head: Normocephalic and atraumatic.  Nose: Nose normal.  Eyes: Pupils are equal, round, and reactive to light. Conjunctivae and EOM are normal. Right eye exhibits no discharge. Left eye exhibits no discharge. No scleral icterus.  Neck: Normal range of motion. Neck supple.  Cardiovascular: Normal rate, regular rhythm, normal heart sounds and intact distal pulses. Exam reveals no gallop and no friction rub.  No murmur heard. Pulmonary/Chest: Effort normal and breath sounds normal. No respiratory distress.  Abdominal: Soft. Bowel sounds are normal. She exhibits no distension. There is tenderness. There is no guarding.  Mild tenderness to  palpation of the lower abdomen without rebound or guarding noted.  Musculoskeletal: Normal range of motion. She exhibits no edema.  No lower extremity edema, erythema or calf tenderness bilaterally.  Neurological: She is alert. No cranial nerve deficit or sensory deficit. She exhibits normal muscle tone. Coordination normal.  Alert to self, family members.  Unable to recall the year, month or season.  She believes she  is in [place]. Pupils reactive. No facial asymmetry noted. Cranial nerves appear grossly intact. Sensation intact to light touch on face, BUE and BLE. Strength 5/5 in BUE and BLE.  Skin: Skin is warm and dry. No rash noted.  Psychiatric: She has a normal mood and affect.  Nursing note and vitals reviewed.    ED Treatments / Results  Labs (all labs ordered are listed, but only abnormal results are displayed) Labs Reviewed  CBC WITH DIFFERENTIAL/PLATELET - Abnormal; Notable for the following components:      Result Value   RDW 18.4 (*)    All other components within normal limits  BASIC METABOLIC PANEL - Abnormal; Notable for the following components:   Glucose, Bld 124 (*)    Creatinine, Ser 1.02 (*)    Calcium 8.2 (*)    GFR calc non Af Amer 48 (*)    GFR calc Af Amer 55 (*)    All other components within normal limits  URINALYSIS, ROUTINE W REFLEX MICROSCOPIC - Abnormal; Notable for the following components:   Color, Urine STRAW (*)    APPearance HAZY (*)    All other components within normal limits  URINE CULTURE  TROPONIN I    EKG EKG Interpretation  Date/Time:  Sunday May 23 2018 14:33:20 EDT Ventricular Rate:  100 PR Interval:    QRS Duration: 166 QT Interval:  400 QTC Calculation: 516 R Axis:   101 Text Interpretation:  Atrial fibrillation Right bundle branch block Anteroseptal infarct, age indeterminate No significant change since last tracing Confirmed by Deno Etienne [phone]) on 05/23/2018 2:36:42 PM   Radiology Dg Chest 2  View  Result Date: 05/23/2018 CLINICAL DATA:  84 year old who had a syncopal episode last night while sitting in a chair. Patient with orthostatic hypotension upon EMS arrival. EXAM: CHEST - 2 VIEW COMPARISON:  03/16/2018, 11/06/2017 and earlier, including CTA chest 11/06/2017. FINDINGS: Cardiac silhouette markedly enlarged, unchanged. LEFT subclavian single lead transvenous pacemaker with the lead tip at the RV apex, unchanged and intact. Large hiatal hernia. Thoracic aorta atherosclerotic, unchanged. Hilar and mediastinal contours otherwise unremarkable. Mild atelectasis involving the lower lobes. Lungs otherwise clear. Bronchovascular markings normal. Pulmonary vascularity normal. No pleural effusions. Degenerative changes involving the thoracic spine. Osseous demineralization. Severe degenerative changes in both shoulder joints. IMPRESSION: 1. Stable marked cardiomegaly. Mild atelectasis involving the lower lobes. No acute cardiopulmonary disease otherwise. 2. Stable large hiatal hernia. Electronically Signed   By: Evangeline Dakin M.D.   On: 05/23/2018 16:56   Ct Head Wo Contrast  Result Date: 05/23/2018 CLINICAL DATA:  84 year old female with history of syncopal episode today. EXAM: CT HEAD WITHOUT CONTRAST TECHNIQUE: Contiguous axial images were obtained from the base of the skull through the vertex without intravenous contrast. COMPARISON:  Head CT 11/06/2017. FINDINGS: Brain: Mild to moderate cerebral atrophy. Patchy and confluent areas of decreased attenuation are noted throughout the deep and periventricular white matter of the cerebral hemispheres bilaterally, compatible with chronic microvascular ischemic disease. No evidence of acute infarction, hemorrhage, hydrocephalus, extra-axial collection or mass lesion/mass effect. Vascular: No hyperdense vessel or unexpected calcification. Skull: Normal. Negative for fracture or focal lesion. Sinuses/Orbits: No acute finding. Other: None. IMPRESSION: 1. No  acute intracranial abnormalities. 2. Mild to moderate cerebral atrophy. 3. Chronic microvascular ischemic changes in the cerebral white matter, similar to the prior examination. Electronically Signed   By: Vinnie Langton M.D.   On: 05/23/2018 17:24   Ct Abdomen Pelvis W Contrast  Result Date: 05/23/2018  CLINICAL DATA:  Lower abdominal pain, nausea and vomiting for 1 day. Status post cholecystectomy July 2019. History of hysterectomy. EXAM: CT ABDOMEN AND PELVIS WITH CONTRAST TECHNIQUE: Multidetector CT imaging of the abdomen and pelvis was performed using the standard protocol following bolus administration of intravenous contrast. CONTRAST:  7mL OMNIPAQUE IOHEXOL 300 MG/ML  SOLN COMPARISON:  CTA abdomen and pelvis March 29, 2018 FINDINGS: Mild motion degraded examination. LOWER CHEST: Lung bases are clear. Stable cardiomegaly. No pericardial effusion. Pacemaker wires in place. No pericardial effusion. HEPATOBILIARY: Subcentimeter hypodensities in the liver most compatible with cysts. No intrahepatic biliary dilatation. Status post cholecystectomy. No fluid collections within gallbladder fossa. PANCREAS: Normal. SPLEEN: Normal. ADRENALS/URINARY TRACT: Kidneys are orthotopic, demonstrating symmetric enhancement. Mildly atrophic LEFT kidney. Bilateral renal scarring. 6 mm RIGHT interpolar nephrolithiasis. No hydronephrosis or solid renal masses. 2.8 cm homogeneously hypodense benign-appearing cyst RIGHT upper pole. 14 mm RIGHT lower pole cyst. RIGHT parapelvic cysts. The unopacified ureters are normal in course and caliber. Delayed imaging through the kidneys demonstrates symmetric prompt contrast excretion within the proximal urinary collecting system. Urinary bladder is partially distended and unremarkable. Normal adrenal glands. STOMACH/BOWEL: Large hiatal hernia. Small large bowel are normal in caliber. Mobile cecum located in central abdomen with normal appendix RIGHT upper quadrant. Moderate retained large  bowel stool. Mild diverticulosis. VASCULAR/LYMPHATIC: Stable 2.6 cm ectatic aorta. Moderate calcific atherosclerosis. No lymphadenopathy by CT size criteria. REPRODUCTIVE: Status post hysterectomy. OTHER: No intraperitoneal free fluid or free air. MUSCULOSKELETAL: Nonacute. Thoracolumbar dextroscoliosis, L4-5 interspinous prosthesis. Severe RIGHT L5-S1 neural foraminal narrowing. Mild old L2 compression fracture and severe degenerative change of the lumbar spine. IMPRESSION: 1. No acute intra-abdominal or pelvic process on this motion degraded examination. Moderate retained large bowel stool. 2. 6 mm non-obstructing RIGHT nephrolithiasis. Mildly atrophic LEFT kidney. 3. Status post cholecystectomy without complication. Status post hysterectomy. 4. Stable 2.6 cm ectatic infrarenal aorta at risk for aneurysm development. Recommend followup by ultrasound in 5 years. This recommendation follows ACR consensus guidelines: White Paper of the ACR Incidental Findings Committee II on Vascular Findings. J Am Coll Radiol 2013; 10:789-794. Aortic Atherosclerosis (ICD10-I70.0). Electronically Signed   By: Elon Alas M.D.   On: 05/23/2018 00:21    Procedures Procedures (including critical care time)  Medications Ordered in ED Medications - No data to display   Initial Impression / Assessment and Plan / ED Course  I have reviewed the triage vital signs and the nursing notes.  Pertinent labs & imaging results that were available during my care of the patient were reviewed by me and considered in my medical decision making (see chart for details).     84 year old female with past medical history of A. fib not currently anticoagulated, CHF, COPD, hypertension, hyperlipidemia, prior CVA, pacemaker in place, history of PE who presents to ED for evaluation of syncopal episode that occurred prior to arrival.  Patient seen and evaluated in the ED last night.  She was having severe lower abdominal pain at that time.   She was found to have a moderate amount of stool in the colon.  She took 1 dose of her MiraLAX and while she was taking it, family states that she was "unresponsive."  States that she was going in and out of consciousness for about 7 minutes.  The whole time, she was sitting outside on the porch on a rocking chair.  She denies any head injuries, numbness in arms or legs.  She does report bilateral fingertip paresthesias for the past 3 days.  Family states that mental status is at baseline.  On physical exam patient is overall well-appearing.  Urinalysis no evidence of UTI.  BMP, CBC unremarkable.  EKG shows no significant changes since prior tracings.  CT of the head is unremarkable.  Chest x-ray unremarkable.  She is afebrile.  Other vital signs remain within normal limits.  Patient had several ectopic beats noted on rhythm strip.  During these times, she would become hypoxic with oxygen saturations in the 80s.  Suspect that her syncopal episode is secondary to vagal laying from the abdominal pain.  She will need to be admitted and monitored while being on her bowel regimen.  Hospitalist to admit.  Portions of this note were generated with [employer]. Dictation errors may occur despite best attempts at proofreading.   Final Clinical Impressions(s) / ED Diagnoses   Final diagnoses:  Syncope, unspecified syncope type    ED Discharge Orders    None       Delia Heady, PA-C 05/23/18 1854    Duffy Bruce, MD 05/24/18 1301

## 2018-05-23 NOTE — ED Notes (Signed)
D/c instructions reviewed with family in front of ED doors, family verbalized understanding of instructions, no signature pad available.

## 2018-05-23 NOTE — Plan of Care (Signed)
Patient admitted to unit, educated on plan for night, all medications, call bell, bed controls, room set up, and dietary orders. Patient stated understanding of all education topics.

## 2018-05-23 NOTE — ED Notes (Signed)
[employer] Pacemaker interrogated.   

## 2018-05-23 NOTE — Discharge Instructions (Signed)
CT and labs looked ok today, no significant findings other than some stool burden. Take the prescribed medication as directed. Follow-up with your primary care doctor. Return to the ED for new or worsening symptoms.

## 2018-05-23 NOTE — H&P (Addendum)
History and Physical  Raven Gray XLK:[id number] DOB: Jul 10, 1928 DOA: 05/23/2018  Referring physician: Dr Nicanor Alcon  PCP: Janie Morning, DO  Outpatient Specialists: Cardiology Patient coming from: Home  Chief Complaint: Passed out  HPI: Raven Gray is a 84 y.o. female with medical history significant for TIA, tachycardia-bradycardia syndrome status post pacemaker placement, hypertension, pulmonary embolism not on anticoagulation due to high risk fall and bleeding, chronic diastolic CHF, COPD on 2L oxygen as needed who presented to ED [facility] from home for presyncopal evaluation.  This afternoon around noon was going in and out of consciousness at home in her chair, lasting around 7 minutes total and witnessed by her caretaker.  Patient states she felt lightheaded.  Denies falling.  During this visit the patient is alert and oriented x3.  She denies any chest pain, palpitation, dyspnea.  She presented to the ED [facility] yesterday with complaints of right lower quadrant abdominal pain associated with nausea.  CT abdomen and pelvis with contrast showed constipation and right sided nephrolithiasis.  Her symptoms are now resolved.  She denies any chills or fevers.  States her last bowel movement was yesterday morning.  She is in the room accompanied by her daughter and granddaughter.  Reports generalized weakness and dry mouth with poor oral intake.  ED Course: On presentation to the ED heart rate in the 30s and bouncing back to normal.  Pacemaker interrogated awaiting results.  Lab studies unremarkable.  Negative UA.  Twelve-lead EKG independently reviewed revealed A. fib with rate of 100 and QTC prolongation 516.  CT head no contrast unremarkable for any acute intracranial findings.  TRH asked to admit for presyncope work-up.  Review of Systems: Review of systems as noted in the HPI. All other systems reviewed and are negative.   Past Medical History:  Diagnosis Date  . Atrial fibrillation ([place])    off AC due to  falls  . Chest pain   . CHF (congestive heart failure) ([place])   . Cholecystitis 03/2018  . COPD (chronic obstructive pulmonary disease) ([facility])    wears prn home O2  . Dementia   . H/O echocardiogram    a. 08/2016: EF of 60-65%, severely dilated LA, mild pulmonic regurgitations, mild TR, and PA Pressure at 38 mm Hg  . History of depression   . History of pulmonary embolism   . Hyperlipidemia   . Hypertension   . Hypokalemia    Now improved with treatment  . Pre-diabetes   . Shortness of breath   . 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 Tachy-brady syndrome ([place]) 04/01/2017   s/p MDT PPM    Past Surgical History:  Procedure Laterality Date  . ABDOMINAL HYSTERECTOMY    . BACK SURGERY    . CHOLECYSTECTOMY N/A 03/29/2018   Procedure: LAPAROSCOPIC CHOLECYSTECTOMY WITH INTRAOPERATIVE CHOLANGIOGRAM;  Surgeon: Alphonsa Overall, MD;  Location: [place];  Service: General;  Laterality: N/A;  . PACEMAKER IMPLANT N/A 04/01/2017   Procedure: Pacemaker Implant;  Surgeon: Sanda Klein, MD;  Location: [place] CV LAB;  Service: Cardiovascular;  Laterality: N/A; MDT [facility] XT SR MRI  . REPLACEMENT TOTAL KNEE    . ROTATOR CUFF REPAIR      Social History:  reports that she has never smoked. She has never used smokeless tobacco. She reports that she does not drink alcohol or use drugs.   No Known Allergies  Family History  Problem Relation Age of Onset  . Alzheimer's disease Mother   .  Heart attack Father   . Diabetes type II Daughter   . Heart disease Daughter        stents     Prior to Admission medications   Medication Sig Start Date End Date Taking? Authorizing Provider  acetaminophen (TYLENOL) 325 MG tablet Take 650 mg by mouth every 6 (six) hours as needed (for pain).    Yes [provider]  ALPRAZolam (XANAX) 0.25 MG tablet Take 1 tablet (0.25 mg total) by mouth 3 (three) times daily as needed for anxiety (anxiety). Patient taking differently: Take 0.25 mg by mouth  3 (three) times daily as needed for anxiety.  04/01/18  Yes Florencia Reasons, MD  furosemide (LASIX) 40 MG tablet Take 1 tablet (40 mg total) by mouth daily. Take 1 tablet (40 mg) two times a day for 3 days. Then 1 tablet by mouth daily. Patient taking differently: Take 20 mg by mouth daily.  04/01/18  Yes Florencia Reasons, MD  MAGNESIUM PO Take 1 tablet by mouth at bedtime.    Yes [provider]  MELATONIN PO Take 1 tablet by mouth at bedtime as needed (sleep).    Yes [provider]  metoprolol tartrate (LOPRESSOR) 25 MG tablet Take 1 tablet (25 mg total) by mouth daily. 03/01/18  Yes Barrett, Evelene Croon, PA-C  montelukast (SINGULAIR) 10 MG tablet Take 10 mg by mouth at bedtime.   Yes [provider]  Multiple Vitamin (MULTIVITAMIN WITH MINERALS) TABS tablet Take 1 tablet by mouth daily.   Yes [provider]  nitroGLYCERIN (NITROSTAT) 0.4 MG SL tablet Place 0.4 mg under the tongue every 5 (five) minutes as needed for chest pain. X 3 doses 02/08/16  Yes [provider]  omeprazole (PRILOSEC) 20 MG capsule Take 20 mg by mouth daily as needed (for reflux/heartburn).  04/23/15  Yes [provider]  ondansetron (ZOFRAN ODT) 4 MG disintegrating tablet Take 1 tablet (4 mg total) by mouth every 8 (eight) hours as needed for nausea. 05/23/18  Yes Larene Pickett, PA-C  OXYGEN Inhale 2 L into the lungs daily as needed (shortness of breath).    Yes [provider]  polyethylene glycol powder (GLYCOLAX/MIRALAX) powder Take 17 g by mouth 2 (two) times daily. Until daily soft stools OTC 05/23/18  Yes Larene Pickett, PA-C  potassium chloride SA (K-DUR,KLOR-CON) 20 MEQ tablet Take 1 tablet (20 mEq total) by mouth daily as needed (along with lasix.). Patient taking differently: Take 20 mEq by mouth daily.  11/09/17  Yes Lavina Hamman, MD  pravastatin (PRAVACHOL) 40 MG tablet Take 40 mg by mouth daily.    Yes [provider]  traZODone (DESYREL) 50 MG tablet Take 50 mg  by mouth at bedtime.  02/24/18  Yes [provider]  venlafaxine XR (EFFEXOR-XR) 150 MG 24 hr capsule Take 150 mg by mouth See admin instructions. Take one capsule (150 mg) by mouth with a 75 mg capsule for a total dose of 225 mg daily   Yes [provider]  venlafaxine XR (EFFEXOR-XR) 75 MG 24 hr capsule Take 75 mg by mouth See admin instructions. Take one capsule (75 mg) by mouth with a 150 mg capsule for a total dose of 225 mg daily 07/14/17  Yes [provider]    Physical Exam: BP 125/75   Pulse 64   Temp 97.8 F (36.6 C) (Oral)   Resp 20   Ht 5\' 3"  (1.6 m)   Wt 71.2 kg   SpO2  94%   BMI 27.81 kg/m   . General: 84 y.o. year-old female well developed well nourished in no acute distress.  Alert and oriented x3. . Cardiovascular: Regular rate and rhythm with no rubs or gallops.  No thyromegaly or JVD noted.  No lower extremity edema. 2/4 pulses in all 4 extremities. Marland Kitchen Respiratory: Clear to auscultation with no wheezes or rales. Good inspiratory effort. . Abdomen: Soft nontender nondistended with normal bowel sounds x4 quadrants.  Mild tenderness on palpation of right lower abdominal quadrant. . Muskuloskeletal: No cyanosis, clubbing noted bilaterally.  Left knee edema noted which is chronic.  Left knee scar from previous surgery. . Neuro: CN II-XII intact, strength, sensation, reflexes . Skin: No ulcerative lesions noted or rashes . Psychiatry: Judgement and insight appear normal. Mood is appropriate for condition and setting          Labs on Admission:  Basic Metabolic Panel: Recent Labs  Lab 05/22/18 2155 05/23/18 1457  NA 139 140  K 3.7 4.2  CL 101 105  CO2 28 28  GLUCOSE 170* 124*  BUN 15 13  CREATININE 1.19* 1.02*  CALCIUM 8.8* 8.2*   Liver Function Tests: Recent Labs  Lab 05/22/18 2155  AST 24  ALT 16  ALKPHOS 94  BILITOT 0.8  PROT 6.4*  ALBUMIN 3.2*   Recent Labs  Lab 05/22/18 2155  LIPASE 29   No results for input(s):  AMMONIA in the last 168 hours. CBC: Recent Labs  Lab 05/22/18 2155 05/23/18 1457  WBC 6.5 5.9  NEUTROABS  --  3.4  HGB 13.6 12.8  HCT 45.2 42.3  MCV 88.6 89.2  PLT 184 151   Cardiac Enzymes: Recent Labs  Lab 05/23/18 1516  TROPONINI <0.03    BNP (last 3 results) Recent Labs    07/01/17 0825 11/06/17 0946 03/29/18 0131  BNP 555.2* 450.1* 811.5*    ProBNP (last 3 results) No results for input(s): PROBNP in the last 8760 hours.  CBG: No results for input(s): GLUCAP in the last 168 hours.  Radiological Exams on Admission: Dg Chest 2 View  Result Date: 05/23/2018 CLINICAL DATA:  84 year old who had a syncopal episode last night while sitting in a chair. Patient with orthostatic hypotension upon EMS arrival. EXAM: CHEST - 2 VIEW COMPARISON:  03/16/2018, 11/06/2017 and earlier, including CTA chest 11/06/2017. FINDINGS: Cardiac silhouette markedly enlarged, unchanged. LEFT subclavian single lead transvenous pacemaker with the lead tip at the RV apex, unchanged and intact. Large hiatal hernia. Thoracic aorta atherosclerotic, unchanged. Hilar and mediastinal contours otherwise unremarkable. Mild atelectasis involving the lower lobes. Lungs otherwise clear. Bronchovascular markings normal. Pulmonary vascularity normal. No pleural effusions. Degenerative changes involving the thoracic spine. Osseous demineralization. Severe degenerative changes in both shoulder joints. IMPRESSION: 1. Stable marked cardiomegaly. Mild atelectasis involving the lower lobes. No acute cardiopulmonary disease otherwise. 2. Stable large hiatal hernia. Electronically Signed   By: Evangeline Dakin M.D.   On: 05/23/2018 16:56   Ct Head Wo Contrast  Result Date: 05/23/2018 CLINICAL DATA:  84 year old female with history of syncopal episode today. EXAM: CT HEAD WITHOUT CONTRAST TECHNIQUE: Contiguous axial images were obtained from the base of the skull through the vertex without intravenous contrast. COMPARISON:   Head CT 11/06/2017. FINDINGS: Brain: Mild to moderate cerebral atrophy. Patchy and confluent areas of decreased attenuation are noted throughout the deep and periventricular white matter of the cerebral hemispheres bilaterally, compatible with chronic microvascular ischemic disease. No evidence of acute infarction, hemorrhage, hydrocephalus, extra-axial collection or mass  lesion/mass effect. Vascular: No hyperdense vessel or unexpected calcification. Skull: Normal. Negative for fracture or focal lesion. Sinuses/Orbits: No acute finding. Other: None. IMPRESSION: 1. No acute intracranial abnormalities. 2. Mild to moderate cerebral atrophy. 3. Chronic microvascular ischemic changes in the cerebral white matter, similar to the prior examination. Electronically Signed   By: Vinnie Langton M.D.   On: 05/23/2018 17:24   Ct Abdomen Pelvis W Contrast  Result Date: 05/23/2018 CLINICAL DATA:  Lower abdominal pain, nausea and vomiting for 1 day. Status post cholecystectomy July 2019. History of hysterectomy. EXAM: CT ABDOMEN AND PELVIS WITH CONTRAST TECHNIQUE: Multidetector CT imaging of the abdomen and pelvis was performed using the standard protocol following bolus administration of intravenous contrast. CONTRAST:  34mL OMNIPAQUE IOHEXOL 300 MG/ML  SOLN COMPARISON:  CTA abdomen and pelvis March 29, 2018 FINDINGS: Mild motion degraded examination. LOWER CHEST: Lung bases are clear. Stable cardiomegaly. No pericardial effusion. Pacemaker wires in place. No pericardial effusion. HEPATOBILIARY: Subcentimeter hypodensities in the liver most compatible with cysts. No intrahepatic biliary dilatation. Status post cholecystectomy. No fluid collections within gallbladder fossa. PANCREAS: Normal. SPLEEN: Normal. ADRENALS/URINARY TRACT: Kidneys are orthotopic, demonstrating symmetric enhancement. Mildly atrophic LEFT kidney. Bilateral renal scarring. 6 mm RIGHT interpolar nephrolithiasis. No hydronephrosis or solid renal masses. 2.8  cm homogeneously hypodense benign-appearing cyst RIGHT upper pole. 14 mm RIGHT lower pole cyst. RIGHT parapelvic cysts. The unopacified ureters are normal in course and caliber. Delayed imaging through the kidneys demonstrates symmetric prompt contrast excretion within the proximal urinary collecting system. Urinary bladder is partially distended and unremarkable. Normal adrenal glands. STOMACH/BOWEL: Large hiatal hernia. Small large bowel are normal in caliber. Mobile cecum located in central abdomen with normal appendix RIGHT upper quadrant. Moderate retained large bowel stool. Mild diverticulosis. VASCULAR/LYMPHATIC: Stable 2.6 cm ectatic aorta. Moderate calcific atherosclerosis. No lymphadenopathy by CT size criteria. REPRODUCTIVE: Status post hysterectomy. OTHER: No intraperitoneal free fluid or free air. MUSCULOSKELETAL: Nonacute. Thoracolumbar dextroscoliosis, L4-5 interspinous prosthesis. Severe RIGHT L5-S1 neural foraminal narrowing. Mild old L2 compression fracture and severe degenerative change of the lumbar spine. IMPRESSION: 1. No acute intra-abdominal or pelvic process on this motion degraded examination. Moderate retained large bowel stool. 2. 6 mm non-obstructing RIGHT nephrolithiasis. Mildly atrophic LEFT kidney. 3. Status post cholecystectomy without complication. Status post hysterectomy. 4. Stable 2.6 cm ectatic infrarenal aorta at risk for aneurysm development. Recommend followup by ultrasound in 5 years. This recommendation follows ACR consensus guidelines: White Paper of the ACR Incidental Findings Committee II on Vascular Findings. J Am Coll Radiol 2013; 10:789-794. Aortic Atherosclerosis (ICD10-I70.0). Electronically Signed   By: Elon Alas M.D.   On: 05/23/2018 00:21    EKG: I independently viewed the EKG done and my findings are as followed: Independently reviewed twelve-lead EKG which revealed A. fib with a rate of 100 and QTC prolongation of 516.  Assessment/Plan Present on  Admission: . Pre-syncope  Active Problems:   Pre-syncope  Presyncope unclear etiology CT head with no contrast unremarkable for any acute intracranial findings Has a pacemaker which has been interrogated awaiting results Follows with cardiology outpatient We will monitor on telemetry Obtain carotid Doppler ultrasound Obtain orthostatic vital signs Last 2D echo was done on 11/07/2017 revealed LVEF 50 to 55% with severe focal basal hypertrophy of the septum and mild hypokinesis of the anterior and anterolateral myocardium. Labs unremarkable  Generalized weakness/physical debility PT to assess Fall precautions  QTC prolongation QTC 516 on EKG Avoid QTC prolonging agents Repeat twelve-lead EKG in the morning  Right nephrolithiasis  Found on CT abdomen and pelvis with contrast done on 05/22/2018 Encourage oral fluid hydration Pain management as needed Gentle IV fluid hydration  Infrarenal aorta risk for aneurysm 2.6 cm Incidentally found on CT abdomen and pelvis with contrast Recommend follow-up by ultrasound in 5 years  Chronic A. fib is not on anticoagulation due to high risk fall and bleeding Rate controlled  Intermittent bradycardia On Lopressor 25 mg daily Monitor heart rate closely Has pacemaker in place  History of tachybradycardia syndrome Post pacemaker Follows with cardiology outpatient If persistent may consider consulting cardiology in the morning  CKD 3 Baseline creatinine 0.9 with GFR 55 Creatinine on presentation 1.02 Avoid nephrotoxic agents/hypotension/dehydration Start gentle IV hydration normal saline at 50 cc/h Repeat BMP in the morning Monitor urine output  Chronic diastolic CHF Last 2D echo done on 11/07/2017 revealed LVEF 50 to 55% with severe focal basal hypertrophy of the septum and mild hypokinesis of the anterior and anterolateral myocardium Continue current medications Strict I's and O's Daily weight   DVT prophylaxis: Subcu Lovenox  daily  Code Status: DNR  Family Communication: Daughter and granddaughter at bedside  Disposition Plan: Telemetry  Consults called: None  Admission status: Observation    Kayleen Memos MD [facility] Pager [phone]  If 7PM-7AM, please contact night-coverage [email or web address] Password [facility]  05/23/2018, 6:27 PM

## 2018-05-23 NOTE — Plan of Care (Signed)
Discussed with patient with plan of care, pain management and routine of the department with some teach back displayed.

## 2018-05-23 NOTE — ED Triage Notes (Signed)
Pt from home via ems; here last night for constipation; sent home w/ miralax, took a dose; approx 3 min later had a syncopal episode while sitting in chair, family lowered pt to ground; no cp, no sob, no nausea; positive orthostatics w/ ems 130/76 to 98/51 lying to sitting; hx pacemaker, gallstones  1L NS  130/90 HR 100 RR 18 95% RA CBG 237

## 2018-05-24 ENCOUNTER — Observation Stay (HOSPITAL_BASED_OUTPATIENT_CLINIC_OR_DEPARTMENT_OTHER): Payer: Medicare Other

## 2018-05-24 DIAGNOSIS — I361 Nonrheumatic tricuspid (valve) insufficiency: Secondary | ICD-10-CM

## 2018-05-24 DIAGNOSIS — R4020 Unspecified coma: Secondary | ICD-10-CM

## 2018-05-24 DIAGNOSIS — F329 Major depressive disorder, single episode, unspecified: Secondary | ICD-10-CM

## 2018-05-24 DIAGNOSIS — E7800 Pure hypercholesterolemia, unspecified: Secondary | ICD-10-CM

## 2018-05-24 DIAGNOSIS — E860 Dehydration: Secondary | ICD-10-CM

## 2018-05-24 DIAGNOSIS — Z950 Presence of cardiac pacemaker: Secondary | ICD-10-CM

## 2018-05-24 LAB — COMPREHENSIVE METABOLIC PANEL
ALBUMIN: 2.9 g/dL — AB (ref 3.5–5.0)
ALK PHOS: 90 U/L (ref 38–126)
ALT: 14 U/L (ref 0–44)
AST: 23 U/L (ref 15–41)
Anion gap: 6 (ref 5–15)
BUN: 12 mg/dL (ref 8–23)
CALCIUM: 8.8 mg/dL — AB (ref 8.9–10.3)
CHLORIDE: 105 mmol/L (ref 98–111)
CO2: 31 mmol/L (ref 22–32)
CREATININE: 0.96 mg/dL (ref 0.44–1.00)
GFR calc Af Amer: 59 mL/min — ABNORMAL LOW (ref >60)
GFR calc non Af Amer: 51 mL/min — ABNORMAL LOW (ref >60)
Glucose, Bld: 99 mg/dL (ref 70–99)
Potassium: 4.2 mmol/L (ref 3.5–5.1)
SODIUM: 142 mmol/L (ref 135–145)
Total Bilirubin: 0.8 mg/dL (ref 0.3–1.2)
Total Protein: 6 g/dL — ABNORMAL LOW (ref 6.5–8.1)

## 2018-05-24 LAB — CBC
HCT: 43.7 % (ref 36.0–46.0)
HEMOGLOBIN: 13.2 g/dL (ref 12.0–15.0)
MCH: 26.9 pg (ref 26.0–34.0)
MCHC: 30.2 g/dL (ref 30.0–36.0)
MCV: 89.2 fL (ref 78.0–100.0)
PLATELETS: 152 10*3/uL (ref 150–400)
RBC: 4.9 MIL/uL (ref 3.87–5.11)
RDW: 18.6 % — ABNORMAL HIGH (ref 11.5–15.5)
WBC: 5.8 10*3/uL (ref 4.0–10.5)

## 2018-05-24 LAB — URINE CULTURE

## 2018-05-24 LAB — ECHOCARDIOGRAM COMPLETE
Height: 63 in
WEIGHTICAEL: 2451.52 [oz_av]

## 2018-05-24 LAB — MAGNESIUM: Magnesium: 1.9 mg/dL (ref 1.7–2.4)

## 2018-05-24 MED ORDER — SODIUM CHLORIDE 0.9 % IV BOLUS
500.0000 mL | Freq: Once | INTRAVENOUS | Status: AC
  Administered 2018-05-24: 500 mL via INTRAVENOUS

## 2018-05-24 NOTE — Progress Notes (Signed)
PT note Orthostatic BPs  Supine 182/83, 61 bpm  Sitting 176/87, 68 bpm  Standing 158/102, 80 bpm  Standing after 3 min 117/102, 69 bpm  Pt with hypertension as well as orthostatic hypotension with mobility.  Did get pt up in chair to eat lunch as dizziness improved once in chair.  Will follow acutely.  Thanks.  [place] [phone] (pager)

## 2018-05-24 NOTE — Progress Notes (Signed)
  Echocardiogram 2D Echocardiogram has been performed.  Marsh Heckler T Burnice Vassel 05/24/2018, 11:58 AM

## 2018-05-24 NOTE — Progress Notes (Signed)
PROGRESS NOTE    Raven Gray  PXT:[id number] DOB: 11/23/1927 DOA: 05/23/2018 PCP: Raven Morning, DO   Brief Narrative:  HPI Per Dr. Irene Pap on 05/23/18 HPI: Raven Gray is a 84 y.o. female with medical history significant for TIA, tachycardia-bradycardia syndrome status post pacemaker placement, hypertension, pulmonary embolism not on anticoagulation due to high risk fall and bleeding, chronic diastolic CHF, COPD on 2L oxygen as needed who presented to ED [facility] from home for presyncopal evaluation.  This afternoon around noon was going in and out of consciousness at home in her chair, lasting around 7 minutes total and witnessed by her caretaker.  Patient states she felt lightheaded.  Denies falling.  During this visit the patient is alert and oriented x3.  She denies any chest pain, palpitation, dyspnea.  She presented to the ED [facility] yesterday with complaints of right lower quadrant abdominal pain associated with nausea.  CT abdomen and pelvis with contrast showed constipation and right sided nephrolithiasis.  Her symptoms are now resolved.  She denies any chills or fevers.  States her last bowel movement was yesterday Gray.  She is in the room accompanied by her daughter and granddaughter.  Reports generalized weakness and dry mouth with poor oral intake.  ED Course: On presentation to the ED heart rate in the 30s and bouncing back to normal.  Pacemaker interrogated awaiting results.  Lab studies unremarkable.  Negative UA.  Twelve-lead EKG independently reviewed revealed A. fib with rate of 100 and QTC prolongation 516.  CT head no contrast unremarkable for any acute intracranial findings.  TRH asked to admit for presyncope work-up.  **Patient felt dizzy and weak and still has Syncopal workup in progress. Was found to be Orthostatic based on PT's evaluation and will be given IVF bolus and continued hydration.   Assessment & Plan:   Active Problems:   COPD (chronic obstructive pulmonary  disease) (HCC)   Essential hypertension   Depression   Dehydration   Loss of consciousness (HCC)   Tachycardia-bradycardia syndrome (HCC)   Chronic atrial fibrillation (HCC)   Pacemaker   History of pulmonary embolism   Syncope   HLD (hyperlipidemia)  Syncope and Dizziness likely 2/2 to Orthostatic Hypotension r/o Cardiac Causes  -CT head with no contrast unremarkable for any acute intracranial findings -Has a pacemaker which has been interrogated awaiting results; Has a HX of Non-sustained VT -Follows with Cardiology Outpatient but may need Cardiology evaluation given syncopal episodes sitting so will consult Cardiology.  -We will monitor on telemetry -Obtain carotid Doppler ultrasound and pending and awaiting to be done -Obtained orthostatic vital signs and she was Positive Orthostatic; Given 500 mL bolus and will repeat ORthostaic VS in AM and continue IVF hydration -Last 2D echo was done on 11/07/2017 revealed LVEF 50 to 55% with severe focal basal hypertrophy of the septum and mild hypokinesis of the anterior and anterolateral myocardium. -Repeat ECHO done this Visit and as below and showed EF of 50-55% -Labs unremarkable  Generalized weakness/physical debility -PT to assess and recommending Home Health PT vs. Supervision 24/hr -C/w Fall precautions  QTC prolongation, improved  -QTC 516 on EKG -Avoid QTC prolonging agents -Repeat twelve-lead EKG in the Gray showed QTc to be 482  Right Nephrolithiasis -Found on CT abdomen and pelvis with contrast done on 05/22/2018 -Encourage oral fluid hydration -Pain management as needed -Gentle IV fluid hydration with NS at 50 mL/hr and 500 mL bolus   Infrarenal aorta risk for aneurysm 2.6 cm -Incidentally  found on CT abdomen and pelvis with contrast -Recommend follow-up by ultrasound in 5 years  Chronic A. fib is not on anticoagulation due to high risk fall and bleeding -Currently Rate controlled on Metoprolol    Intermittent Bradycardia -On Lopressor 25 mg daily -Monitor heart rate closely on Telemetry -Has pacemaker in place  History of tachybradycardia syndrome s/p Permanent pacemaker placement in July 2018 -Follows with cardiology outpatient -If persistent may consider consulting cardiology in the Gray  CKD 3 -Baseline creatinine 0.9 with GFR 55 -Creatinine on presentation 1.02 -Avoid nephrotoxic agents/hypotension/dehydration -Start gentle IV hydration normal saline at 50 cc/hr and given 500 mL bolus -Monitor urine output -Repeat CMP in AM  Chronic Diastolic CHF -Last 2D echo done on 11/07/2017 revealed LVEF 50 to 55% with severe focal basal hypertrophy of the septum and mild hypokinesis of the anterior and anterolateral myocardium -Repeat ECHO this Visit because of Syncope did not mention Diastolic function but did show Moderate Aortic Regurgitation and Moderately dilated LA and Severely Dilated RA -Continue current medications -Strict I's and O's -Daily Weights  COPD -C/w Home O2 requirements 2Liters PRN -Not on any Bronchodilators   HLD -C/w Home Statin with Pravastain 40 mg po Daily  Reflux/Heartburn -C/w Omperazole substitution with po Pantoprazole 40 mg po Daily  Depression/Anxeity -C/w Home Venlafaxine and Alprazolam 0.25 mg TIDPRN  DVT prophylaxis: Enoxaparin 40 mg sq q24h Code Status: DO NOT RESUSCITATE  Family Communication: No family present at bedside Disposition Plan: [place] PT/OT and resumption of Care Connection services when medically stable to be D/C'd in approximately 24-48 hours  Consultants:  None   Procedures:  ECHOCARDIOGRAM ------------------------------------------------------------------- Study Conclusions  - Left ventricle: The cavity size was normal. Wall thickness was   normal. Systolic function was normal. The estimated ejection   fraction was in the range of 50% to 55%. - Aortic valve: There was moderate  regurgitation. Valve area   (Vmax): 1.29 cm^2. - Mitral valve: Mildly calcified annulus. Moderately thickened   leaflets . - Left atrium: The atrium was moderately dilated. - Right atrium: The atrium was severely dilated. - Tricuspid valve: There was mild-moderate regurgitation. - Pulmonary arteries: Systolic pressure was moderately to severely   increased. PA peak pressure: 62 mm Hg (S).  CAROTID DUPLEX pending   Antimicrobials:  Anti-infectives (From admission, onward)   None     Subjective: Seen and examined at bedside still felt very lightheaded and dizzy and complained of being uncomfortable laying in bed and want to get to the chair.  She is very unhappy with nursing staff as they did not come in soon enough and move her to the chair.  No chest pain or shortness of breath.  Does not know what happened and still feels lightheaded.  Objective: Vitals:   05/23/18 2013 05/23/18 2015 05/24/18 0020 05/24/18 0837  BP: (!) 145/91 (!) 122/110 (!) 146/73 (!) 143/76  Pulse: 69 67 (!) 56 87  Resp: 18 18 18    Temp: 98 F (36.7 C)  (!) 97.5 F (36.4 C) 97.8 F (36.6 C)  TempSrc: Oral  Oral Oral  SpO2: 93% 97% 94% 94%  Weight:   69.5 kg   Height:        Intake/Output Summary (Last 24 hours) at 05/24/2018 1752 Last data filed at 05/24/2018 0730 Gross per 24 hour  Intake 885.67 ml  Output 800 ml  Net 85.67 ml   Filed Weights   05/23/18 1433 05/24/18 0020  Weight: 71.2 kg 69.5 kg  Examination: Physical Exam:  Constitutional: WN/WD elderly Caucasian female in NAD and appears extremely anxious and uncomfortable Eyes: Lids and conjunctivae normal, sclerae anicteric  ENMT: External Ears, Nose appear normal. Grossly normal hearing.  Neck: Appears normal, supple, no cervical masses, normal ROM, no appreciable thyromegaly, no JVD Respiratory: Diminished to auscultation bilaterally, no wheezing, rales, rhonchi or crackles. Normal respiratory effort and patient is not tachypenic. No  accessory muscle use. Not wearing supplemental O2 via [place] Cardiovascular: RRR, Has a murmur. S1 and S2 auscultated. Trace LE edema Abdomen: Soft, non-tender, Distended slightly. No masses palpated. No appreciable hepatosplenomegaly. Bowel sounds positive x4.  GU: Deferred. Musculoskeletal: No clubbing / cyanosis of digits/nails. Skin: No rashes, lesions, ulcers on a limited skin evaluation. No induration; Warm and dry.  Neurologic: CN 2-12 grossly intact with no focal deficits. Romberg sign and cerebellar reflexes not assessed.  Psychiatric: Normal judgment and insight. Alert and oriented x 3. Extremely anxious mood and appropriate affect.   Data Reviewed: I have personally reviewed following labs and imaging studies  CBC: Recent Labs  Lab 05/22/18 2155 05/23/18 1457 05/24/18 0334  WBC 6.5 5.9 5.8  NEUTROABS  --  3.4  --   HGB 13.6 12.8 13.2  HCT 45.2 42.3 43.7  MCV 88.6 89.2 89.2  PLT 184 151 353   Basic Metabolic Panel: Recent Labs  Lab 05/22/18 2155 05/23/18 1457 05/24/18 0334  NA 139 140 142  K 3.7 4.2 4.2  CL 101 105 105  CO2 28 28 31   GLUCOSE 170* 124* 99  BUN 15 13 12   CREATININE 1.19* 1.02* 0.96  CALCIUM 8.8* 8.2* 8.8*  MG  --   --  1.9   GFR: Estimated Creatinine Clearance: 37.9 mL/min (by C-G formula based on SCr of 0.96 mg/dL). Liver Function Tests: Recent Labs  Lab 05/22/18 2155 05/24/18 0334  AST 24 23  ALT 16 14  ALKPHOS 94 90  BILITOT 0.8 0.8  PROT 6.4* 6.0*  ALBUMIN 3.2* 2.9*   Recent Labs  Lab 05/22/18 2155  LIPASE 29   No results for input(s): AMMONIA in the last 168 hours. Coagulation Profile: No results for input(s): INR, PROTIME in the last 168 hours. Cardiac Enzymes: Recent Labs  Lab 05/23/18 1516  TROPONINI <0.03   BNP (last 3 results) No results for input(s): PROBNP in the last 8760 hours. HbA1C: No results for input(s): HGBA1C in the last 72 hours. CBG: No results for input(s): GLUCAP in the last 168 hours. Lipid  Profile: No results for input(s): CHOL, HDL, LDLCALC, TRIG, CHOLHDL, LDLDIRECT in the last 72 hours. Thyroid Function Tests: No results for input(s): TSH, T4TOTAL, FREET4, T3FREE, THYROIDAB in the last 72 hours. Anemia Panel: No results for input(s): VITAMINB12, FOLATE, FERRITIN, TIBC, IRON, RETICCTPCT in the last 72 hours. Sepsis Labs: No results for input(s): PROCALCITON, LATICACIDVEN in the last 168 hours.  Recent Results (from the past 240 hour(s))  Urine culture     Status: Abnormal   Collection Time: 05/23/18  4:52 PM  Result Value Ref Range Status   Specimen Description URINE, RANDOM  Final   Special Requests   Final    NONE Performed at [facility], [address] [place], [place] [address]    Culture MULTIPLE SPECIES PRESENT, SUGGEST RECOLLECTION (A)  Final   Report Status 05/24/2018 FINAL  Final    Radiology Studies: Dg Chest 2 View  Result Date: 05/23/2018 CLINICAL DATA:  84 year old who had a syncopal episode last night while sitting in  a chair. Patient with orthostatic hypotension upon EMS arrival. EXAM: CHEST - 2 VIEW COMPARISON:  03/16/2018, 11/06/2017 and earlier, including CTA chest 11/06/2017. FINDINGS: Cardiac silhouette markedly enlarged, unchanged. LEFT subclavian single lead transvenous pacemaker with the lead tip at the RV apex, unchanged and intact. Large hiatal hernia. Thoracic aorta atherosclerotic, unchanged. Hilar and mediastinal contours otherwise unremarkable. Mild atelectasis involving the lower lobes. Lungs otherwise clear. Bronchovascular markings normal. Pulmonary vascularity normal. No pleural effusions. Degenerative changes involving the thoracic spine. Osseous demineralization. Severe degenerative changes in both shoulder joints. IMPRESSION: 1. Stable marked cardiomegaly. Mild atelectasis involving the lower lobes. No acute cardiopulmonary disease otherwise. 2. Stable large hiatal hernia. Electronically Signed   By: Evangeline Dakin M.D.   On:  05/23/2018 16:56   Ct Head Wo Contrast  Result Date: 05/23/2018 CLINICAL DATA:  84 year old female with history of syncopal episode today. EXAM: CT HEAD WITHOUT CONTRAST TECHNIQUE: Contiguous axial images were obtained from the base of the skull through the vertex without intravenous contrast. COMPARISON:  Head CT 11/06/2017. FINDINGS: Brain: Mild to moderate cerebral atrophy. Patchy and confluent areas of decreased attenuation are noted throughout the deep and periventricular white matter of the cerebral hemispheres bilaterally, compatible with chronic microvascular ischemic disease. No evidence of acute infarction, hemorrhage, hydrocephalus, extra-axial collection or mass lesion/mass effect. Vascular: No hyperdense vessel or unexpected calcification. Skull: Normal. Negative for fracture or focal lesion. Sinuses/Orbits: No acute finding. Other: None. IMPRESSION: 1. No acute intracranial abnormalities. 2. Mild to moderate cerebral atrophy. 3. Chronic microvascular ischemic changes in the cerebral white matter, similar to the prior examination. Electronically Signed   By: Vinnie Langton M.D.   On: 05/23/2018 17:24   Ct Abdomen Pelvis W Contrast  Result Date: 05/23/2018 CLINICAL DATA:  Lower abdominal pain, nausea and vomiting for 1 day. Status post cholecystectomy July 2019. History of hysterectomy. EXAM: CT ABDOMEN AND PELVIS WITH CONTRAST TECHNIQUE: Multidetector CT imaging of the abdomen and pelvis was performed using the standard protocol following bolus administration of intravenous contrast. CONTRAST:  50mL OMNIPAQUE IOHEXOL 300 MG/ML  SOLN COMPARISON:  CTA abdomen and pelvis March 29, 2018 FINDINGS: Mild motion degraded examination. LOWER CHEST: Lung bases are clear. Stable cardiomegaly. No pericardial effusion. Pacemaker wires in place. No pericardial effusion. HEPATOBILIARY: Subcentimeter hypodensities in the liver most compatible with cysts. No intrahepatic biliary dilatation. Status post  cholecystectomy. No fluid collections within gallbladder fossa. PANCREAS: Normal. SPLEEN: Normal. ADRENALS/URINARY TRACT: Kidneys are orthotopic, demonstrating symmetric enhancement. Mildly atrophic LEFT kidney. Bilateral renal scarring. 6 mm RIGHT interpolar nephrolithiasis. No hydronephrosis or solid renal masses. 2.8 cm homogeneously hypodense benign-appearing cyst RIGHT upper pole. 14 mm RIGHT lower pole cyst. RIGHT parapelvic cysts. The unopacified ureters are normal in course and caliber. Delayed imaging through the kidneys demonstrates symmetric prompt contrast excretion within the proximal urinary collecting system. Urinary bladder is partially distended and unremarkable. Normal adrenal glands. STOMACH/BOWEL: Large hiatal hernia. Small large bowel are normal in caliber. Mobile cecum located in central abdomen with normal appendix RIGHT upper quadrant. Moderate retained large bowel stool. Mild diverticulosis. VASCULAR/LYMPHATIC: Stable 2.6 cm ectatic aorta. Moderate calcific atherosclerosis. No lymphadenopathy by CT size criteria. REPRODUCTIVE: Status post hysterectomy. OTHER: No intraperitoneal free fluid or free air. MUSCULOSKELETAL: Nonacute. Thoracolumbar dextroscoliosis, L4-5 interspinous prosthesis. Severe RIGHT L5-S1 neural foraminal narrowing. Mild old L2 compression fracture and severe degenerative change of the lumbar spine. IMPRESSION: 1. No acute intra-abdominal or pelvic process on this motion degraded examination. Moderate retained large bowel stool. 2. 6 mm non-obstructing RIGHT  nephrolithiasis. Mildly atrophic LEFT kidney. 3. Status post cholecystectomy without complication. Status post hysterectomy. 4. Stable 2.6 cm ectatic infrarenal aorta at risk for aneurysm development. Recommend followup by ultrasound in 5 years. This recommendation follows ACR consensus guidelines: White Paper of the ACR Incidental Findings Committee II on Vascular Findings. J Am Coll Radiol 2013; 10:789-794. Aortic  Atherosclerosis (ICD10-I70.0). Electronically Signed   By: Elon Alas M.D.   On: 05/23/2018 00:21   Scheduled Meds: . enoxaparin (LOVENOX) injection  40 mg Subcutaneous Q24H  . montelukast  10 mg Oral QHS  . multivitamin with minerals  1 tablet Oral Daily  . pantoprazole  40 mg Oral Daily  . polyethylene glycol  17 g Oral Daily  . pravastatin  40 mg Oral Daily  . senna-docusate  1 tablet Oral BID  . traZODone  50 mg Oral QHS  . venlafaxine XR  150 mg Oral Daily   And  . venlafaxine XR  75 mg Oral Daily   Continuous Infusions: . sodium chloride 50 mL/hr at 05/24/18 0659    LOS: 0 days   Kerney Elbe, DO [facility] PAGER is on [place]  If 7PM-7AM, please contact night-coverage [email or web address] Password [facility] 05/24/2018, 5:52 PM

## 2018-05-24 NOTE — Evaluation (Signed)
Physical Therapy Evaluation Patient Details Name: DEZARIA METHOT MRN: [id number] DOB: 1928/09/02 Today's Date: 05/24/2018   History of Present Illness  KJERSTI DITTMER is a 84 y.o. female with medical history significant for TIA, tachycardia-bradycardia syndrome status post pacemaker placement, hypertension, pulmonary embolism not on anticoagulation due to high risk fall and bleeding, chronic diastolic CHF, COPD on 2L oxygen as needed who presented to ED [facility] from home for presyncopal evaluation.  This afternoon around noon was going in and out of consciousness at home in her chair, lasting around 7 minutes total and witnessed by her caretaker.  Patient states she felt lightheaded.  Denies falling.  Clinical Impression  Pt admitted with above diagnosis. Pt currently with functional limitations due to the deficits listed below (see PT Problem List). Pt was able to ambulate around bed.  Slight dizziness with orthostasis but pt worked through it.  Lunch came and pt wanted to stay up in chair.  Willfollow acutely.  Pt will benefit from skilled PT to increase their independence and safety with mobility to allow discharge to the venue listed below.    Orthostatic BPs  Supine 182/83, 61 bpm  Sitting 176/87, 68 bpm  Standing 158/102, 80 bpm  Standing after 3 min 117/102, 69 bpm     Follow Up Recommendations Home health PT;Supervision/Assistance - 24 hour    Equipment Recommendations  None recommended by PT    Recommendations for Other Services       Precautions / Restrictions Precautions Precautions: Fall Restrictions Weight Bearing Restrictions: No      Mobility  Bed Mobility Overal bed mobility: Independent                Transfers Overall transfer level: Needs assistance Equipment used: Rolling walker (2 wheeled) Transfers: Sit to/from Stand Sit to Stand: Min guard         General transfer comment: steadying assist once on feet.  See note regarding orthostasis,.     Ambulation/Gait Ambulation/Gait assistance: Min guard Gait Distance (Feet): 15 Feet Assistive device: Rolling walker (2 wheeled) Gait Pattern/deviations: Step-through pattern;Decreased stride length;Trunk flexed;Wide base of support   Gait velocity interpretation: <1.31 ft/sec, indicative of household ambulator General Gait Details: Steadying assist with ambulation for safety.  Assisted with steering RW  Stairs            Wheelchair Mobility    Modified Rankin (Stroke Patients Only)       Balance Overall balance assessment: Needs assistance Sitting-balance support: No upper extremity supported;Feet supported Sitting balance-Leahy Scale: Fair     Standing balance support: Bilateral upper extremity supported;During functional activity Standing balance-Leahy Scale: Poor Standing balance comment: relies on UE support                             Pertinent Vitals/Pain Pain Assessment: No/denies pain    Home Living Family/patient expects to be discharged to:: Private residence Living Arrangements: Children Available Help at Discharge: Family;Available 24 hours/day Type of Home: House Home Access: Ramped entrance;Stairs to enter Entrance Stairs-Rails: None Entrance Stairs-Number of Steps: 2 Home Layout: One level Home Equipment: Clinical cytogeneticist - 2 wheels;Cane - single point;Bedside commode;Wheelchair - manual;Tub bench;Walker - 4 wheels(was on 2LO2 at home prn)      Prior Function Level of Independence: Needs assistance   Gait / Transfers Assistance Needed: A - seee below  ADL's / Homemaking Assistance Needed: Assist with ADLs  Comments: ambulates with either rollator or RW  with min assist at all times per daughtrer     Hand Dominance   Dominant Hand: Right    Extremity/Trunk Assessment   Upper Extremity Assessment Upper Extremity Assessment: Defer to OT evaluation    Lower Extremity Assessment Lower Extremity Assessment: Generalized  weakness    Cervical / Trunk Assessment Cervical / Trunk Assessment: Kyphotic  Communication   Communication: No difficulties  Cognition Arousal/Alertness: Awake/alert Behavior During Therapy: WFL for tasks assessed/performed Overall Cognitive Status: Within Functional Limits for tasks assessed                                        General Comments      Exercises     Assessment/Plan    PT Assessment Patient needs continued PT services  PT Problem List Decreased balance;Decreased mobility;Decreased activity tolerance;Decreased knowledge of use of DME;Decreased safety awareness;Decreased knowledge of precautions;Cardiopulmonary status limiting activity       PT Treatment Interventions DME instruction;Gait training;Functional mobility training;Therapeutic activities;Therapeutic exercise;Balance training;Patient/family education    PT Goals (Current goals can be found in the Care Plan section)  Acute Rehab PT Goals Patient Stated Goal: to get better PT Goal Formulation: With patient Time For Goal Achievement: 06/07/18 Potential to Achieve Goals: Good    Frequency Min 3X/week   Barriers to discharge        Co-evaluation               AM-PAC PT "6 Clicks" Daily Activity  Outcome Measure Difficulty turning over in bed (including adjusting bedclothes, sheets and blankets)?: None Difficulty moving from lying on back to sitting on the side of the bed? : None Difficulty sitting down on and standing up from a chair with arms (e.g., wheelchair, bedside commode, etc,.)?: A Little Help needed moving to and from a bed to chair (including a wheelchair)?: A Little Help needed walking in hospital room?: A Little Help needed climbing 3-5 steps with a railing? : A Lot 6 Click Score: 19    End of Session Equipment Utilized During Treatment: Gait belt;Oxygen Activity Tolerance: Patient limited by fatigue Patient left: in chair;with call bell/phone within  reach;with family/visitor present Nurse Communication: Mobility status PT Visit Diagnosis: Unsteadiness on feet (R26.81);Muscle weakness (generalized) (M62.81)    Time: 6301-6010 PT Time Calculation (min) (ACUTE ONLY): 31 min   Charges:   PT Evaluation $PT Eval Moderate Complexity: 1 Mod PT Treatments $Gait Training: 8-22 mins        [place] [phone] (pager)   Denice Paradise 05/24/2018, 1:32 PM

## 2018-05-24 NOTE — Progress Notes (Signed)
Care connection:  Pt is an active pt with Care connection a home based [facility] program provided by [facility]. She will be eligible to resume services when discharged home. She receives a Marine scientist and MSW with services. Please call our office with any questions. [facility] Liaison

## 2018-05-25 ENCOUNTER — Observation Stay (HOSPITAL_BASED_OUTPATIENT_CLINIC_OR_DEPARTMENT_OTHER): Payer: Medicare Other

## 2018-05-25 DIAGNOSIS — I5032 Chronic diastolic (congestive) heart failure: Secondary | ICD-10-CM | POA: Diagnosis present

## 2018-05-25 DIAGNOSIS — Z833 Family history of diabetes mellitus: Secondary | ICD-10-CM | POA: Diagnosis not present

## 2018-05-25 DIAGNOSIS — Z8589 Personal history of malignant neoplasm of other organs and systems: Secondary | ICD-10-CM | POA: Diagnosis not present

## 2018-05-25 DIAGNOSIS — Z950 Presence of cardiac pacemaker: Secondary | ICD-10-CM | POA: Diagnosis not present

## 2018-05-25 DIAGNOSIS — R55 Syncope and collapse: Secondary | ICD-10-CM

## 2018-05-25 DIAGNOSIS — Z8673 Personal history of transient ischemic attack (TIA), and cerebral infarction without residual deficits: Secondary | ICD-10-CM | POA: Diagnosis not present

## 2018-05-25 DIAGNOSIS — I951 Orthostatic hypotension: Secondary | ICD-10-CM | POA: Diagnosis present

## 2018-05-25 DIAGNOSIS — Z9049 Acquired absence of other specified parts of digestive tract: Secondary | ICD-10-CM | POA: Diagnosis not present

## 2018-05-25 DIAGNOSIS — K219 Gastro-esophageal reflux disease without esophagitis: Secondary | ICD-10-CM | POA: Diagnosis present

## 2018-05-25 DIAGNOSIS — Z820 Family history of epilepsy and other diseases of the nervous system: Secondary | ICD-10-CM | POA: Diagnosis not present

## 2018-05-25 DIAGNOSIS — Z90710 Acquired absence of both cervix and uterus: Secondary | ICD-10-CM | POA: Diagnosis not present

## 2018-05-25 DIAGNOSIS — I482 Chronic atrial fibrillation: Secondary | ICD-10-CM

## 2018-05-25 DIAGNOSIS — Z86711 Personal history of pulmonary embolism: Secondary | ICD-10-CM | POA: Diagnosis not present

## 2018-05-25 DIAGNOSIS — Z96652 Presence of left artificial knee joint: Secondary | ICD-10-CM | POA: Diagnosis present

## 2018-05-25 DIAGNOSIS — K5900 Constipation, unspecified: Secondary | ICD-10-CM | POA: Diagnosis present

## 2018-05-25 DIAGNOSIS — F419 Anxiety disorder, unspecified: Secondary | ICD-10-CM | POA: Diagnosis present

## 2018-05-25 DIAGNOSIS — Z9981 Dependence on supplemental oxygen: Secondary | ICD-10-CM | POA: Diagnosis not present

## 2018-05-25 DIAGNOSIS — J449 Chronic obstructive pulmonary disease, unspecified: Secondary | ICD-10-CM | POA: Diagnosis not present

## 2018-05-25 DIAGNOSIS — Z8249 Family history of ischemic heart disease and other diseases of the circulatory system: Secondary | ICD-10-CM | POA: Diagnosis not present

## 2018-05-25 DIAGNOSIS — N183 Chronic kidney disease, stage 3 (moderate): Secondary | ICD-10-CM | POA: Diagnosis present

## 2018-05-25 DIAGNOSIS — I130 Hypertensive heart and chronic kidney disease with heart failure and stage 1 through stage 4 chronic kidney disease, or unspecified chronic kidney disease: Secondary | ICD-10-CM | POA: Diagnosis present

## 2018-05-25 DIAGNOSIS — I10 Essential (primary) hypertension: Secondary | ICD-10-CM | POA: Diagnosis not present

## 2018-05-25 DIAGNOSIS — I495 Sick sinus syndrome: Secondary | ICD-10-CM | POA: Diagnosis present

## 2018-05-25 DIAGNOSIS — F0390 Unspecified dementia without behavioral disturbance: Secondary | ICD-10-CM | POA: Diagnosis present

## 2018-05-25 DIAGNOSIS — E860 Dehydration: Secondary | ICD-10-CM | POA: Diagnosis not present

## 2018-05-25 DIAGNOSIS — E785 Hyperlipidemia, unspecified: Secondary | ICD-10-CM | POA: Diagnosis present

## 2018-05-25 LAB — CBC WITH DIFFERENTIAL/PLATELET
ABS IMMATURE GRANULOCYTES: 0 10*3/uL (ref 0.0–0.1)
BASOS ABS: 0.1 10*3/uL (ref 0.0–0.1)
Basophils Relative: 1 %
Eosinophils Absolute: 0.3 10*3/uL (ref 0.0–0.7)
Eosinophils Relative: 5 %
HCT: 44.9 % (ref 36.0–46.0)
HEMOGLOBIN: 13.7 g/dL (ref 12.0–15.0)
IMMATURE GRANULOCYTES: 0 %
LYMPHS PCT: 29 %
Lymphs Abs: 1.7 10*3/uL (ref 0.7–4.0)
MCH: 26.6 pg (ref 26.0–34.0)
MCHC: 30.5 g/dL (ref 30.0–36.0)
MCV: 87.2 fL (ref 78.0–100.0)
Monocytes Absolute: 0.6 10*3/uL (ref 0.1–1.0)
Monocytes Relative: 10 %
NEUTROS ABS: 3.3 10*3/uL (ref 1.7–7.7)
NEUTROS PCT: 55 %
PLATELETS: 156 10*3/uL (ref 150–400)
RBC: 5.15 MIL/uL — AB (ref 3.87–5.11)
RDW: 18.2 % — ABNORMAL HIGH (ref 11.5–15.5)
WBC: 6 10*3/uL (ref 4.0–10.5)

## 2018-05-25 LAB — COMPREHENSIVE METABOLIC PANEL
ALBUMIN: 3.1 g/dL — AB (ref 3.5–5.0)
ALK PHOS: 87 U/L (ref 38–126)
ALT: 13 U/L (ref 0–44)
AST: 21 U/L (ref 15–41)
Anion gap: 7 (ref 5–15)
BUN: 8 mg/dL (ref 8–23)
CHLORIDE: 105 mmol/L (ref 98–111)
CO2: 31 mmol/L (ref 22–32)
CREATININE: 0.99 mg/dL (ref 0.44–1.00)
Calcium: 8.9 mg/dL (ref 8.9–10.3)
GFR calc Af Amer: 57 mL/min — ABNORMAL LOW (ref >60)
GFR calc non Af Amer: 49 mL/min — ABNORMAL LOW (ref >60)
GLUCOSE: 122 mg/dL — AB (ref 70–99)
Potassium: 3.2 mmol/L — ABNORMAL LOW (ref 3.5–5.1)
SODIUM: 143 mmol/L (ref 135–145)
Total Bilirubin: 0.8 mg/dL (ref 0.3–1.2)
Total Protein: 6.4 g/dL — ABNORMAL LOW (ref 6.5–8.1)

## 2018-05-25 LAB — MAGNESIUM: Magnesium: 1.7 mg/dL (ref 1.7–2.4)

## 2018-05-25 LAB — PHOSPHORUS: Phosphorus: 2.7 mg/dL (ref 2.5–4.6)

## 2018-05-25 MED ORDER — METOPROLOL TARTRATE 12.5 MG HALF TABLET
12.5000 mg | ORAL_TABLET | Freq: Two times a day (BID) | ORAL | Status: DC
  Administered 2018-05-25 – 2018-05-26 (×3): 12.5 mg via ORAL
  Filled 2018-05-25 (×3): qty 1

## 2018-05-25 MED ORDER — BISACODYL 10 MG RE SUPP
10.0000 mg | Freq: Once | RECTAL | Status: AC
  Administered 2018-05-25: 10 mg via RECTAL
  Filled 2018-05-25: qty 1

## 2018-05-25 MED ORDER — METOPROLOL TARTRATE 25 MG PO TABS: 25.0000 mg | ORAL_TABLET | Freq: Two times a day (BID) | ORAL | Status: DC

## 2018-05-25 MED ORDER — POLYETHYLENE GLYCOL 3350 17 G PO PACK
17.0000 g | PACK | Freq: Two times a day (BID) | ORAL | Status: DC
  Administered 2018-05-25 – 2018-05-26 (×2): 17 g via ORAL
  Filled 2018-05-25 (×2): qty 1

## 2018-05-25 MED ORDER — POTASSIUM CHLORIDE CRYS ER 20 MEQ PO TBCR
40.0000 meq | EXTENDED_RELEASE_TABLET | Freq: Two times a day (BID) | ORAL | Status: AC
  Administered 2018-05-25 (×2): 40 meq via ORAL
  Filled 2018-05-25 (×2): qty 2

## 2018-05-25 MED ORDER — ONDANSETRON HCL 4 MG/2ML IJ SOLN: 4.0000 mg | Freq: Three times a day (TID) | INTRAMUSCULAR | Status: DC | PRN

## 2018-05-25 MED ORDER — HYDRALAZINE HCL 20 MG/ML IJ SOLN
5.0000 mg | Freq: Once | INTRAMUSCULAR | Status: AC
  Administered 2018-05-25: 5 mg via INTRAVENOUS
  Filled 2018-05-25: qty 1

## 2018-05-25 MED ORDER — SODIUM CHLORIDE 0.9 % IV BOLUS
500.0000 mL | Freq: Once | INTRAVENOUS | Status: AC
  Administered 2018-05-25: 500 mL via INTRAVENOUS

## 2018-05-25 MED ORDER — SODIUM CHLORIDE 0.9 % IV SOLN
INTRAVENOUS | Status: DC
  Administered 2018-05-25: 17:00:00 via INTRAVENOUS

## 2018-05-25 NOTE — Consult Note (Signed)
   [facility] Inpatient Consult   05/25/2018  SHANDREKA DANTE 10/21/1927 [id number]   [facility] Care management note:  This Probation officer was alerted from [employer] with [place] that this patient is active in the [place] and remains eligivle when she transitions back to her residence.  For questions, please contact:  Natividad Brood, RN BSN [facility] Liaison  [phone] business mobile phone Toll free office [phone]

## 2018-05-25 NOTE — Consult Note (Addendum)
Cardiology Consultation:   Patient ID: Raven Gray; [id number]; 14-Aug-1928   Admit date: 05/23/2018 Date of Consult: 05/25/2018  Primary Care Provider: Janie Morning, DO Primary Cardiologist: Quay Burow, MD  Primary Electrophysiologist:     Patient Profile:   Raven Gray is a 84 y.o. female with a hx of atrial fibrillation, PE/DVT previously on xarelto but stopped 10/2017 due to age and fall risk, chronic diastolic heart failure, COPD on home PRN O2, HTN, HLD, prior stroke, tachy-brady syndrome s/p PPM in place, and  who is being seen today for the evaluation of syncope at the request of Dr. Alfredia Ferguson.  History of Present Illness:   Ms. Menendez was last seen by cardiology 03/01/18. She was doing well at that time. She has dyspnea on exertion at baseline. It was noted that home health nurses had increased her lopressor which was causing chronotropic incompetence despite pacemaker. PPM interrogation with V pacing 67.5% instead of her baseline 20%. Beta blocker was decreased to previous dose.    She was seen in the ER on 05/22/18 with lower abdominal pain. She had an episode of nausea and vomiting, but was found to be afebrile and nontoxic. She was discharged after resolution of her symptoms with zofan. She presented back to the ER 05/23/18 with an episode of syncope. Daughter (primary caretaker) was at bedside and helped with the history. She states that on 05/23/18 they prepared her miralax as instructed by the EDP that saw her on 8/31/1. She was sitting on the porch, took two drinks of the miralax and then syncopized. This episode was witness by her family. EMS was called and she was brought to the ER. She was found to be extremely orthostatic. She may have been over-diuresed.  CT head was normal. PPM interrogated. Family states that she has been having syncopal episodes since 2016. She wears her home O2 only PRN.   Past Medical History:  Diagnosis Date  . Atrial fibrillation ([place])    off AC due to falls  .  Chest pain   . CHF (congestive heart failure) ([place])   . Cholecystitis 03/2018  . COPD (chronic obstructive pulmonary disease) ([facility])    wears prn home O2  . Dementia   . H/O echocardiogram    a. 08/2016: EF of 60-65%, severely dilated LA, mild pulmonic regurgitations, mild TR, and PA Pressure at 38 mm Hg  . History of depression   . History of pulmonary embolism   . Hyperlipidemia   . Hypertension   . Hypokalemia    Now improved with treatment  . Pre-diabetes   . Shortness of breath   . 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 Tachy-brady syndrome ([place]) 04/01/2017   s/p MDT PPM     Past Surgical History:  Procedure Laterality Date  . ABDOMINAL HYSTERECTOMY    . BACK SURGERY    . CHOLECYSTECTOMY N/A 03/29/2018   Procedure: LAPAROSCOPIC CHOLECYSTECTOMY WITH INTRAOPERATIVE CHOLANGIOGRAM;  Surgeon: Alphonsa Overall, MD;  Location: [facility];  Service: General;  Laterality: N/A;  . PACEMAKER IMPLANT N/A 04/01/2017   Procedure: Pacemaker Implant;  Surgeon: Sanda Klein, MD;  Location: [place] [facility];  Service: Cardiovascular;  Laterality: N/A; MDT [facility] XT SR MRI  . REPLACEMENT TOTAL KNEE    . ROTATOR CUFF REPAIR       Home Medications:  Prior to Admission medications   Medication Sig Start Date End Date Taking? Authorizing Provider  acetaminophen (TYLENOL) 325 MG tablet Take 650  mg by mouth every 6 (six) hours as needed (for pain).    Yes [provider]  ALPRAZolam (XANAX) 0.25 MG tablet Take 1 tablet (0.25 mg total) by mouth 3 (three) times daily as needed for anxiety (anxiety). Patient taking differently: Take 0.25 mg by mouth 3 (three) times daily as needed for anxiety.  04/01/18  Yes Florencia Reasons, MD  furosemide (LASIX) 40 MG tablet Take 1 tablet (40 mg total) by mouth daily. Take 1 tablet (40 mg) two times a day for 3 days. Then 1 tablet by mouth daily. Patient taking differently: Take 20 mg by mouth daily.  04/01/18  Yes Florencia Reasons, MD  MAGNESIUM PO Take 1 tablet by  mouth at bedtime.    Yes [provider]  MELATONIN PO Take 1 tablet by mouth at bedtime as needed (sleep).    Yes [provider]  metoprolol tartrate (LOPRESSOR) 25 MG tablet Take 1 tablet (25 mg total) by mouth daily. 03/01/18  Yes Barrett, Evelene Croon, PA-C  montelukast (SINGULAIR) 10 MG tablet Take 10 mg by mouth at bedtime.   Yes [provider]  Multiple Vitamin (MULTIVITAMIN WITH MINERALS) TABS tablet Take 1 tablet by mouth daily.   Yes [provider]  nitroGLYCERIN (NITROSTAT) 0.4 MG SL tablet Place 0.4 mg under the tongue every 5 (five) minutes as needed for chest pain. X 3 doses 02/08/16  Yes [provider]  omeprazole (PRILOSEC) 20 MG capsule Take 20 mg by mouth daily as needed (for reflux/heartburn).  04/23/15  Yes [provider]  ondansetron (ZOFRAN ODT) 4 MG disintegrating tablet Take 1 tablet (4 mg total) by mouth every 8 (eight) hours as needed for nausea. 05/23/18  Yes Larene Pickett, PA-C  OXYGEN Inhale 2 L into the lungs daily as needed (shortness of breath).    Yes [provider]  polyethylene glycol powder (GLYCOLAX/MIRALAX) powder Take 17 g by mouth 2 (two) times daily. Until daily soft stools OTC 05/23/18  Yes Larene Pickett, PA-C  potassium chloride SA (K-DUR,KLOR-CON) 20 MEQ tablet Take 1 tablet (20 mEq total) by mouth daily as needed (along with lasix.). Patient taking differently: Take 20 mEq by mouth daily.  11/09/17  Yes Lavina Hamman, MD  pravastatin (PRAVACHOL) 40 MG tablet Take 40 mg by mouth daily.    Yes [provider]  traZODone (DESYREL) 50 MG tablet Take 50 mg by mouth at bedtime.  02/24/18  Yes [provider]  venlafaxine XR (EFFEXOR-XR) 150 MG 24 hr capsule Take 150 mg by mouth See admin instructions. Take one capsule (150 mg) by mouth with a 75 mg capsule for a total dose of 225 mg daily   Yes [provider]  venlafaxine XR (EFFEXOR-XR) 75 MG 24 hr capsule Take 75 mg by mouth  See admin instructions. Take one capsule (75 mg) by mouth with a 150 mg capsule for a total dose of 225 mg daily 07/14/17  Yes [provider]    Inpatient Medications: Scheduled Meds: . bisacodyl  10 mg Rectal Once  . enoxaparin (LOVENOX) injection  40 mg Subcutaneous Q24H  . montelukast  10 mg Oral QHS  . multivitamin with minerals  1 tablet Oral Daily  . pantoprazole  40 mg Oral Daily  . polyethylene glycol  17 g Oral BID  . potassium chloride  40 mEq Oral BID  . pravastatin  40 mg Oral Daily  . senna-docusate  1 tablet Oral BID  . traZODone  50  mg Oral QHS  . venlafaxine XR  150 mg Oral Daily   And  . venlafaxine XR  75 mg Oral Daily   Continuous Infusions:  PRN Meds: acetaminophen, ALPRAZolam, ondansetron (ZOFRAN) IV, oxyCODONE  Allergies:   No Known Allergies  Social History:   Social History   Socioeconomic History  . Marital status: Widowed    Spouse name: Not on file  . Number of children: Not on file  . Years of education: Not on file  . Highest education level: Not on file  Occupational History  . Not on file  Social Needs  . Financial resource strain: Not on file  . Food insecurity:    Worry: Not on file    Inability: Not on file  . Transportation needs:    Medical: Not on file    Non-medical: Not on file  Tobacco Use  . Smoking status: Never Smoker  . Smokeless tobacco: Never Used  Substance and Sexual Activity  . Alcohol use: No  . Drug use: No  . Sexual activity: Not Currently    Birth control/protection: None  Lifestyle  . Physical activity:    Days per week: Not on file    Minutes per session: Not on file  . Stress: Not on file  Relationships  . Social connections:    Talks on phone: Not on file    Gets together: Not on file    Attends religious service: Not on file    Active member of club or organization: Not on file    Attends meetings of clubs or organizations: Not on file    Relationship status: Not on file  . Intimate  partner violence:    Fear of current or ex partner: Not on file    Emotionally abused: Not on file    Physically abused: Not on file    Forced sexual activity: Not on file  Other Topics Concern  . Not on file  Social History Narrative  . Not on file    Family History:    Family History  Problem Relation Age of Onset  . Alzheimer's disease Mother   . Heart attack Father   . Diabetes type II Daughter   . Heart disease Daughter        stents     ROS:  Please see the history of present illness.   All other ROS reviewed and negative.     Physical Exam/Data:   Vitals:   05/25/18 0209 05/25/18 0417 05/25/18 0726 05/25/18 0850  BP:   (!) 160/83 (!) 172/111  Pulse:   72 97  Resp:    18  Temp:   98.2 F (36.8 C) (!) 97.4 F (36.3 C)  TempSrc:   Oral Oral  SpO2:   97%   Weight: 69.2 kg 68 kg    Height:        Intake/Output Summary (Last 24 hours) at 05/25/2018 1311 Last data filed at 05/25/2018 3086 Gross per 24 hour  Intake -  Output 2800 ml  Net -2800 ml   Filed Weights   05/24/18 0020 05/25/18 0209 05/25/18 0417  Weight: 69.5 kg 69.2 kg 68 kg   Body mass index is 26.56 kg/m.  General:  Well nourished, well developed, in no acute distress HEENT: normal Neck: no JVD Vascular: No carotid bruits Cardiac:  Irregular rhythm, regular rate Lungs:  clear to auscultation bilaterally, no wheezing, rhonchi or rales  Abd: soft, nontender, no hepatomegaly  Ext: trace edema Musculoskeletal:  No deformities, BUE and BLE strength normal and equal Skin: warm and dry  Neuro:  CNs 2-12 intact, no focal abnormalities noted Psych:  Normal affect   EKG:  The EKG was personally reviewed and demonstrates:  Afib Telemetry:  Telemetry was personally reviewed and demonstrates:  V pacing with episodes of Afib RVR in the 140s  Relevant CV Studies:  Carotid US: pending  Echo 05/24/18: Study Conclusions - Left ventricle: The cavity size was normal. Wall thickness was   normal.  Systolic function was normal. The estimated ejection   fraction was in the range of 50% to 55%. - Aortic valve: There was moderate regurgitation. Valve area   (Vmax): 1.29 cm^2. - Mitral valve: Mildly calcified annulus. Moderately thickened   leaflets . - Left atrium: The atrium was moderately dilated. - Right atrium: The atrium was severely dilated. - Tricuspid valve: There was mild-moderate regurgitation. - Pulmonary arteries: Systolic pressure was moderately to severely   increased. PA peak pressure: 62 mm Hg (S).   Laboratory Data:  Chemistry Recent Labs  Lab 05/23/18 1457 05/24/18 0334 05/25/18 0358  NA 140 142 143  K 4.2 4.2 3.2*  CL 105 105 105  CO2 28 31 31   GLUCOSE 124* 99 122*  BUN 13 12 8   CREATININE 1.02* 0.96 0.99  CALCIUM 8.2* 8.8* 8.9  GFRNONAA 48* 51* 49*  GFRAA 55* 59* 57*  ANIONGAP 7 6 7     Recent Labs  Lab 05/22/18 2155 05/24/18 0334 05/25/18 0358  PROT 6.4* 6.0* 6.4*  ALBUMIN 3.2* 2.9* 3.1*  AST 24 23 21   ALT 16 14 13   ALKPHOS 94 90 87  BILITOT 0.8 0.8 0.8   Hematology Recent Labs  Lab 05/23/18 1457 05/24/18 0334 05/25/18 0358  WBC 5.9 5.8 6.0  RBC 4.74 4.90 5.15*  HGB 12.8 13.2 13.7  HCT 42.3 43.7 44.9  MCV 89.2 89.2 87.2  MCH 27.0 26.9 26.6  MCHC 30.3 30.2 30.5  RDW 18.4* 18.6* 18.2*  PLT 151 152 156   Cardiac Enzymes Recent Labs  Lab 05/23/18 1516  TROPONINI <0.03   No results for input(s): TROPIPOC in the last 168 hours.  BNPNo results for input(s): BNP, PROBNP in the last 168 hours.  DDimer No results for input(s): DDIMER in the last 168 hours.  Radiology/Studies:  Dg Chest 2 View  Result Date: 05/23/2018 CLINICAL DATA:  84 year old who had a syncopal episode last night while sitting in a chair. Patient with orthostatic hypotension upon EMS arrival. EXAM: CHEST - 2 VIEW COMPARISON:  03/16/2018, 11/06/2017 and earlier, including CTA chest 11/06/2017. FINDINGS: Cardiac silhouette markedly enlarged, unchanged. LEFT  subclavian single lead transvenous pacemaker with the lead tip at the RV apex, unchanged and intact. Large hiatal hernia. Thoracic aorta atherosclerotic, unchanged. Hilar and mediastinal contours otherwise unremarkable. Mild atelectasis involving the lower lobes. Lungs otherwise clear. Bronchovascular markings normal. Pulmonary vascularity normal. No pleural effusions. Degenerative changes involving the thoracic spine. Osseous demineralization. Severe degenerative changes in both shoulder joints. IMPRESSION: 1. Stable marked cardiomegaly. Mild atelectasis involving the lower lobes. No acute cardiopulmonary disease otherwise. 2. Stable large hiatal hernia. Electronically Signed   By: Evangeline Dakin M.D.   On: 05/23/2018 16:56   Ct Head Wo Contrast  Result Date: 05/23/2018 CLINICAL DATA:  84 year old female with history of syncopal episode today. EXAM: CT HEAD WITHOUT CONTRAST TECHNIQUE: Contiguous axial images were obtained from the base of the skull through the vertex without intravenous contrast. COMPARISON:  Head CT 11/06/2017. FINDINGS: Brain: Mild  to moderate cerebral atrophy. Patchy and confluent areas of decreased attenuation are noted throughout the deep and periventricular white matter of the cerebral hemispheres bilaterally, compatible with chronic microvascular ischemic disease. No evidence of acute infarction, hemorrhage, hydrocephalus, extra-axial collection or mass lesion/mass effect. Vascular: No hyperdense vessel or unexpected calcification. Skull: Normal. Negative for fracture or focal lesion. Sinuses/Orbits: No acute finding. Other: None. IMPRESSION: 1. No acute intracranial abnormalities. 2. Mild to moderate cerebral atrophy. 3. Chronic microvascular ischemic changes in the cerebral white matter, similar to the prior examination. Electronically Signed   By: Vinnie Langton M.D.   On: 05/23/2018 17:24   Ct Abdomen Pelvis W Contrast  Result Date: 05/23/2018 CLINICAL DATA:  Lower abdominal  pain, nausea and vomiting for 1 day. Status post cholecystectomy July 2019. History of hysterectomy. EXAM: CT ABDOMEN AND PELVIS WITH CONTRAST TECHNIQUE: Multidetector CT imaging of the abdomen and pelvis was performed using the standard protocol following bolus administration of intravenous contrast. CONTRAST:  61mL OMNIPAQUE IOHEXOL 300 MG/ML  SOLN COMPARISON:  CTA abdomen and pelvis March 29, 2018 FINDINGS: Mild motion degraded examination. LOWER CHEST: Lung bases are clear. Stable cardiomegaly. No pericardial effusion. Pacemaker wires in place. No pericardial effusion. HEPATOBILIARY: Subcentimeter hypodensities in the liver most compatible with cysts. No intrahepatic biliary dilatation. Status post cholecystectomy. No fluid collections within gallbladder fossa. PANCREAS: Normal. SPLEEN: Normal. ADRENALS/URINARY TRACT: Kidneys are orthotopic, demonstrating symmetric enhancement. Mildly atrophic LEFT kidney. Bilateral renal scarring. 6 mm RIGHT interpolar nephrolithiasis. No hydronephrosis or solid renal masses. 2.8 cm homogeneously hypodense benign-appearing cyst RIGHT upper pole. 14 mm RIGHT lower pole cyst. RIGHT parapelvic cysts. The unopacified ureters are normal in course and caliber. Delayed imaging through the kidneys demonstrates symmetric prompt contrast excretion within the proximal urinary collecting system. Urinary bladder is partially distended and unremarkable. Normal adrenal glands. STOMACH/BOWEL: Large hiatal hernia. Small large bowel are normal in caliber. Mobile cecum located in central abdomen with normal appendix RIGHT upper quadrant. Moderate retained large bowel stool. Mild diverticulosis. VASCULAR/LYMPHATIC: Stable 2.6 cm ectatic aorta. Moderate calcific atherosclerosis. No lymphadenopathy by CT size criteria. REPRODUCTIVE: Status post hysterectomy. OTHER: No intraperitoneal free fluid or free air. MUSCULOSKELETAL: Nonacute. Thoracolumbar dextroscoliosis, L4-5 interspinous prosthesis. Severe  RIGHT L5-S1 neural foraminal narrowing. Mild old L2 compression fracture and severe degenerative change of the lumbar spine. IMPRESSION: 1. No acute intra-abdominal or pelvic process on this motion degraded examination. Moderate retained large bowel stool. 2. 6 mm non-obstructing RIGHT nephrolithiasis. Mildly atrophic LEFT kidney. 3. Status post cholecystectomy without complication. Status post hysterectomy. 4. Stable 2.6 cm ectatic infrarenal aorta at risk for aneurysm development. Recommend followup by ultrasound in 5 years. This recommendation follows ACR consensus guidelines: White Paper of the ACR Incidental Findings Committee II on Vascular Findings. J Am Coll Radiol 2013; 10:789-794. Aortic Atherosclerosis (ICD10-I70.0). Electronically Signed   By: Elon Alas M.D.   On: 05/23/2018 00:21    Assessment and Plan:   1. Syncope - patient has been struggling with syncope since 2016 - dyspnea on exertion seems to be chronic and lasix has been adjusted on an outpatient basis - carotid US pending - Echo with normal EF - telemetry with Afib RVR with rates in the 140s - PPM interrogation with normal function - etiology for syncopal episode: dehydration from over-diuresis/orhtostatic hypotension vs Afib RVR - will stop lasix, order 20 mg PRN only for weight gain of 3 lbs  - dry weight thought to be near 157 lbs, she is 149 lbs today (153 lb son  admission)   2. Atrial fibrillation - telemetry with Afib RVR with rates in the 140s on telemetry today - was taking metoprolol tartrate 25 mg once daily at home - does not appear that this was continued during this hospitalization - increase lopressor to 12.5 mg BID - PPM interrogation with normal function    3. Tachy-brady syndrome s/p PPM - will increase beta blocker as above - PPM interrogation with normal function   4. Chronic diastolic heart failure - may have been over-diuresed - will decrease lasix to 20 mg PRN for weight gain of 3  lbs in one day - advised her to wear O2 at home    For questions or updates, please contact [facility] Please consult [email or web address] for contact info under Cardiology/STEMI.   Signed, Tami Lin Duke, PA  05/25/2018 1:11 PM  History and all data above reviewed.  Patient examined.  I agree with the findings as above.   The patient has had a few episodes similar to this presentation with near syncope.  I discussed these at length with her daughter and caregiver.  Events of dizziness and presyncope and the above described episode of syncope occur with she is up and sitting or ambulating with her walker.  She is very limited in her activities and now has 24/7 care.  She does not recall events at all.  She has had bradycardia but has normal pacer function as evaluated this admission.  She has atrial fib with rapid rates and her beta blocker has been held this admission secondary to the report of low BPs and the fact that she has significant orthostatic BP drops.    The patient exam reveals [id number]  ,  Lungs: Clear  ,  Abd: Positive bowel sounds, no rebound no guarding, Ext No edema  .  All available labs, radiology testing, previous records reviewed. Agree with documented assessment and plan.  Atrial fib:  I am more concerned about rapid rates rather than the risk of chronotropic incompetence for which the beta blocker was reduced previously.  We are going to restart the metoprolol bid and start with a very low dose.  If they have trouble cutting the pills she could take 25 mg metoprolol bid rather than the 12.5 we are suggesting.  I have asked the family to have another conversation with Dr. Sallyanne Kuster about anticoagulation.  This was held in the past because of a fall.   However, she is never alone now and they think the fall risk is minimal.  Orthostatic hypotension:  This was significant on BPs this admit.  I think this contributes to some of her symptoms.  I would reduce her Lasix to PRN and I  talked to her family about this.   I think that they could be very good about managing this.  I would suggest compression stockings if she is going to be out or up much in a given day and consider an abdominal binder.  Avoidance of prolonged standing and recognizing prodrome is very  Important and I talked to her and her family about this.    Jeneen Rinks Rawlin Reaume  4:09 PM  05/25/2018

## 2018-05-25 NOTE — Plan of Care (Signed)
Discussed plan of care for the evening with patient.  Stressed the importance of using the call button when assistance is needed.  Some teach back displayed.

## 2018-05-25 NOTE — Progress Notes (Signed)
Carotid duplex prelim: 1-39% ICA stenosis.  Jorie Zee Eunice, RDMS, RVT   

## 2018-05-25 NOTE — Care Management Note (Addendum)
Case Management Note  Patient Details  Name: Raven Gray MRN: [id number] Date of Birth: Oct 19, 1927  Subjective/Objective:   NCM spoke with patient and daughter, patient has home oxygen with [facility] 2 liters at home.  She was just with Nanine Means, daughter states they would like to continue with [facility] for HHPT, and [place], [facility].  Referral made to [facility] with Nanine Means.  Soc will begin 24-48 hrs post dc.  Will need HHPT, HHAIDE, HHRN orders with face to face prior to dc.   She is also active with [facility] with [facility].              Action/Plan: DC home when ready.   Expected Discharge Date:  05/25/18               Expected Discharge Plan:  [place]  In-House Referral:     Discharge planning Services  CM Consult  Post Acute Care Choice:  Home Health Choice offered to:  Adult Children  DME Arranged:    DME Agency:     HH Arranged:  PT, Nurse's Aide, [place] Agency:  [place]  Status of Service:  Completed, signed off  If discussed at [place] of Stay Meetings, dates discussed:    Additional Comments:  Zenon Mayo, RN 05/25/2018, 2:35 PM

## 2018-05-25 NOTE — Progress Notes (Signed)
PROGRESS NOTE    Raven Gray  CWC:[id number] DOB: May 21, 1928 DOA: 05/23/2018 PCP: Janie Morning, DO   Brief Narrative:  HPI Per Dr. Irene Pap on 05/23/18 HPI: Raven Gray is a 84 y.o. female with medical history significant for TIA, tachycardia-bradycardia syndrome status post pacemaker placement, hypertension, pulmonary embolism not on anticoagulation due to high risk fall and bleeding, chronic diastolic CHF, COPD on 2L oxygen as needed who presented to ED [facility] from home for presyncopal evaluation.  This afternoon around noon was going in and out of consciousness at home in her chair, lasting around 7 minutes total and witnessed by her caretaker.  Patient states she felt lightheaded.  Denies falling.  During this visit the patient is alert and oriented x3.  She denies any chest pain, palpitation, dyspnea.  She presented to the ED [facility] yesterday with complaints of right lower quadrant abdominal pain associated with nausea.  CT abdomen and pelvis with contrast showed constipation and right sided nephrolithiasis.  Her symptoms are now resolved.  She denies any chills or fevers.  States her last bowel movement was yesterday morning.  She is in the room accompanied by her daughter and granddaughter.  Reports generalized weakness and dry mouth with poor oral intake.  ED Course: On presentation to the ED heart rate in the 30s and bouncing back to normal.  Pacemaker interrogated awaiting results.  Lab studies unremarkable.  Negative UA.  Twelve-lead EKG independently reviewed revealed A. fib with rate of 100 and QTC prolongation 516.  CT head no contrast unremarkable for any acute intracranial findings.  TRH asked to admit for presyncope work-up.  **Patient felt dizzy and weak and still has Syncopal workup in progress. Was found to be Orthostatic based on PT's evaluation and will be given IVF bolus and continued hydration again today given her hemodynamic instability and orthostatic hypotension requiring IV  fluid resuscitation.  Cardiology is consulted for further evaluation and recommendations and recommending making the Lasix as needed and started back on her beta-blocker 12.5 mill grams p.o. twice daily.  Interrogated and was normal  Assessment & Plan:   Active Problems:   COPD (chronic obstructive pulmonary disease) (HCC)   Essential hypertension   Depression   Dehydration   Loss of consciousness (HCC)   Tachycardia-bradycardia syndrome (HCC)   Chronic atrial fibrillation (HCC)   Pacemaker   History of pulmonary embolism   Syncope   HLD (hyperlipidemia)   Pre-syncope  Syncope and Dizziness likely 2/2 to Orthostatic Hypotension r/o Cardiac Causes  -CT head with no contrast unremarkable for any acute intracranial findings -Has a pacemaker which has been interrogated and was normal; Has a HX of Non-sustained VT -Cardiology consulted for further evaluation recommendations and feel he etiology may be overdiuresis/orthostatic hypotension versus A. fib versus RVR -We will monitor on telemetry -Obtain carotid Doppler ultrasound and  -Obtained orthostatic vital signs and she was Positive Orthostatic again today; Given 500 mL bolus again and will repeat ORthostaic VS in AM and continue IVF hydration with NS at 75 mL/hr -Last 2D echo was done on 11/07/2017 revealed LVEF 50 to 55% with severe focal basal hypertrophy of the septum and mild hypokinesis of the anterior and anterolateral myocardium. -Repeat ECHO done this Visit and as below and showed EF of 50-55% -Labs unremarkable -Cardiology feels that she had may been over diuresed because her dry weight is around 157 pounds and she is 149 pounds a day and I have recommended stopping Lasix and ordering 20 mg  as needed when she has a weight gain of over 3 pounds -Allergies also ordered compression stockings if she is going to be up or out and considering an abdominal binder and recommending avoiding prolonged standing and recognizing prodrome is very  important and cardiology discussed this with her family  Generalized weakness/physical debility -PT to assess and recommending Home Health PT vs. Supervision 24/hr -C/w Fall precautions and PT OT recommending home health PT  QTC prolongation, improved  -QTC 516 on EKG -Avoid QTC prolonging agents -Repeat twelve-lead EKG in the morning showed QTc to be 482  Right Nephrolithiasis -Found on CT abdomen and pelvis with contrast done on 05/22/2018 -Encourage oral fluid hydration -Pain management as needed -Gentle IV fluid hydration with NS at 50 mL/hr and 500 mL bolus   Infrarenal aorta risk for aneurysm 2.6 cm -Incidentally found on CT abdomen and pelvis with contrast -Recommend follow-up by ultrasound in 5 years  Chronic A. fib is not on anticoagulation due to high risk fall and bleeding -Was having rates in the 140s on telemetry and taking metoprolol tartrate 25 mg p.o. daily at home -Cardiology started the patient on metoprolol tartrate 12.5 mg p.o. twice daily and recommending may be going back to 25 mg twice daily in the outpatient setting -Etiology recommending discussing with primary cardiologist about anticoagulation as an outpatient setting because family thinks her fall risk is minimal  Intermittent Bradycardia -On Lopressor 25 mg daily -Monitor heart rate closely on Telemetry -Has pacemaker in place  History of tachybradycardia syndrome s/p Permanent pacemaker placement in July 2018 -Follows with cardiology outpatient -Cardiology consulted and increasing beta-blocker as above -Permanent pacemaker was interrogated with normal function  CKD 3 -Baseline creatinine 0.9 with GFR 55 -Creatinine on presentation 1.02 and is now 0.99 -Avoid nephrotoxic agents/hypotension/dehydration -Start gentled IV hydration normal saline at 50 cc/hr and given 500 mL bolus yesterday but this was discontinued.  She was again orthostatic and so she was given another 500 mL bolus and then  started on normal saline at 75 mL's per hour and her Lasix was stopped -Monitor urine output -Repeat CMP in AM  Chronic Diastolic CHF -Last 2D echo done on 11/07/2017 revealed LVEF 50 to 55% with severe focal basal hypertrophy of the septum and mild hypokinesis of the anterior and anterolateral myocardium -Repeat ECHO this Visit because of Syncope did not mention Diastolic function but did show Moderate Aortic Regurgitation and Moderately dilated LA and Severely Dilated RA -Continue current medications -Strict I's and O's -Daily Weights; patient's dry weight is 157 pounds and she is 149 today and cardiology stopped her Lasix and ordered her to have a 20 mg as needed only for weight gain of over 3 pounds  COPD -C/w Home O2 requirements 2Liters PRN -Not on any Bronchodilators   HLD -C/w Home Statin with Pravastain 40 mg po Daily  Reflux/Heartburn -C/w Omperazole substitution with po Pantoprazole 40 mg po Daily  Depression/Anxeity -C/w Home Venlafaxine and Alprazolam 0.25 mg TIDPRN  Hypokalemia -Patient's potassium was 3.2  -Replete with potassium chloride 40 mg twice daily x2 doses -Continue monitor and replete as necessary -Repeat CMP in a.m.  DVT prophylaxis: Enoxaparin 40 mg sq q24h Code Status: DO NOT RESUSCITATE  Family Communication: No family present at bedside Disposition Plan: [facility] PT/OT and resumption of [facility] services when medically stable to be D/C'd in approximately 24-48 hours  Consultants:  Cardiology   Procedures:  ECHOCARDIOGRAM ------------------------------------------------------------------- Study Conclusions  - Left ventricle: The cavity size  was normal. Wall thickness was   normal. Systolic function was normal. The estimated ejection   fraction was in the range of 50% to 55%. - Aortic valve: There was moderate regurgitation. Valve area   (Vmax): 1.29 cm^2. - Mitral valve: Mildly calcified annulus. Moderately  thickened   leaflets . - Left atrium: The atrium was moderately dilated. - Right atrium: The atrium was severely dilated. - Tricuspid valve: There was mild-moderate regurgitation. - Pulmonary arteries: Systolic pressure was moderately to severely   increased. PA peak pressure: 62 mm Hg (S).  CAROTID DUPLEX  Carotid duplex prelim: 1-39% ICA stenosis.   Antimicrobials:  Anti-infectives (From admission, onward)   None     Subjective: Seen and examined at bedside still felt dizzy when standing.  No chest pain, or shortness breath and wanting to go home.  No other concerns or complaints at this time  Objective: Vitals:   05/25/18 0726 05/25/18 0850 05/25/18 1608 05/25/18 1750  BP: (!) 160/83 (!) 172/111 (!) 161/90 (!) 159/104  Pulse: 72 97 87 83  Resp:  18 18   Temp: 98.2 F (36.8 C) (!) 97.4 F (36.3 C) 98.1 F (36.7 C) 97.8 F (36.6 C)  TempSrc: Oral Oral Oral Oral  SpO2: 97%  99% 99%  Weight:      Height:        Intake/Output Summary (Last 24 hours) at 05/25/2018 1754 Last data filed at 05/25/2018 1648 Gross per 24 hour  Intake 147.34 ml  Output 2800 ml  Net -2652.66 ml   Filed Weights   05/24/18 0020 05/25/18 0209 05/25/18 0417  Weight: 69.5 kg 69.2 kg 68 kg   Examination: Physical Exam:  Constitutional: Alert, well-developed elderly Caucasian female is currently in no acute distress but is anxious and wanting to go home but gets dizzy upon standing Eyes: There are anicteric.  Lids and conjunctive are normal ENMT: External ears and nose appear normal.  Grossly normal hearing Neck: Appears supple with no JVD Respiratory: Diminished to auscultation bilaterally no appreciable wheezing, rales, rhonchi.  Patient has normal respiratory effort is not using accessory muscles to breathe Cardiovascular: Irregularly irregular and tachycardic.  Has a murmur.  S1 and S2 auscultated.  No appreciable lower extremity edema Abdomen: Soft, nontender, distended slightly secondary  body habitus.  Bowel sounds present all 4 quadrants GU: Deferred Musculoskeletal: No clubbing or cyanosis.  No joint deformity noted Skin: No appreciable rashes or lesions on limited skin evaluation.  Skin is warm and dry Neurologic: Cranial nerves II through XII grossly intact no appreciable focal deficits Psychiatric: Appeared slightly agitated and wanting to go home.  Patient is alert and oriented x3  Data Reviewed: I have personally reviewed following labs and imaging studies  CBC: Recent Labs  Lab 05/22/18 2155 05/23/18 1457 05/24/18 0334 05/25/18 0358  WBC 6.5 5.9 5.8 6.0  NEUTROABS  --  3.4  --  3.3  HGB 13.6 12.8 13.2 13.7  HCT 45.2 42.3 43.7 44.9  MCV 88.6 89.2 89.2 87.2  PLT 184 151 152 921   Basic Metabolic Panel: Recent Labs  Lab 05/22/18 2155 05/23/18 1457 05/24/18 0334 05/25/18 0358  NA 139 140 142 143  K 3.7 4.2 4.2 3.2*  CL 101 105 105 105  CO2 28 28 31 31   GLUCOSE 170* 124* 99 122*  BUN 15 13 12 8   CREATININE 1.19* 1.02* 0.96 0.99  CALCIUM 8.8* 8.2* 8.8* 8.9  MG  --   --  1.9 1.7  PHOS  --   --   --  2.7   GFR: Estimated Creatinine Clearance: 36.3 mL/min (by C-G formula based on SCr of 0.99 mg/dL). Liver Function Tests: Recent Labs  Lab 05/22/18 2155 05/24/18 0334 05/25/18 0358  AST 24 23 21   ALT 16 14 13   ALKPHOS 94 90 87  BILITOT 0.8 0.8 0.8  PROT 6.4* 6.0* 6.4*  ALBUMIN 3.2* 2.9* 3.1*   Recent Labs  Lab 05/22/18 2155  LIPASE 29   No results for input(s): AMMONIA in the last 168 hours. Coagulation Profile: No results for input(s): INR, PROTIME in the last 168 hours. Cardiac Enzymes: Recent Labs  Lab 05/23/18 1516  TROPONINI <0.03   BNP (last 3 results) No results for input(s): PROBNP in the last 8760 hours. HbA1C: No results for input(s): HGBA1C in the last 72 hours. CBG: No results for input(s): GLUCAP in the last 168 hours. Lipid Profile: No results for input(s): CHOL, HDL, LDLCALC, TRIG, CHOLHDL, LDLDIRECT in the last  72 hours. Thyroid Function Tests: No results for input(s): TSH, T4TOTAL, FREET4, T3FREE, THYROIDAB in the last 72 hours. Anemia Panel: No results for input(s): VITAMINB12, FOLATE, FERRITIN, TIBC, IRON, RETICCTPCT in the last 72 hours. Sepsis Labs: No results for input(s): PROCALCITON, LATICACIDVEN in the last 168 hours.  Recent Results (from the past 240 hour(s))  Urine culture     Status: Abnormal   Collection Time: 05/23/18  4:52 PM  Result Value Ref Range Status   Specimen Description URINE, RANDOM  Final   Special Requests   Final    NONE Performed at [facility], [address], [place], [place] [address]    Culture MULTIPLE SPECIES PRESENT, SUGGEST RECOLLECTION (A)  Final   Report Status 05/24/2018 FINAL  Final    Radiology Studies: No results found. Scheduled Meds: . enoxaparin (LOVENOX) injection  40 mg Subcutaneous Q24H  . metoprolol tartrate  12.5 mg Oral BID  . montelukast  10 mg Oral QHS  . multivitamin with minerals  1 tablet Oral Daily  . pantoprazole  40 mg Oral Daily  . polyethylene glycol  17 g Oral BID  . potassium chloride  40 mEq Oral BID  . pravastatin  40 mg Oral Daily  . senna-docusate  1 tablet Oral BID  . traZODone  50 mg Oral QHS  . venlafaxine XR  150 mg Oral Daily   And  . venlafaxine XR  75 mg Oral Daily   Continuous Infusions: . sodium chloride 75 mL/hr at 05/25/18 1635    LOS: 0 days   Kerney Elbe, DO [facility] PAGER is on [email or web address]  If 7PM-7AM, please contact night-coverage [email or web address] Password [facility] 05/25/2018, 5:54 PM

## 2018-05-26 DIAGNOSIS — I10 Essential (primary) hypertension: Secondary | ICD-10-CM

## 2018-05-26 DIAGNOSIS — Z86711 Personal history of pulmonary embolism: Secondary | ICD-10-CM

## 2018-05-26 DIAGNOSIS — J449 Chronic obstructive pulmonary disease, unspecified: Secondary | ICD-10-CM

## 2018-05-26 LAB — COMPREHENSIVE METABOLIC PANEL
ALT: 12 U/L (ref 0–44)
AST: 21 U/L (ref 15–41)
Albumin: 3.2 g/dL — ABNORMAL LOW (ref 3.5–5.0)
Alkaline Phosphatase: 90 U/L (ref 38–126)
Anion gap: 7 (ref 5–15)
BUN: 9 mg/dL (ref 8–23)
CHLORIDE: 106 mmol/L (ref 98–111)
CO2: 29 mmol/L (ref 22–32)
Calcium: 9 mg/dL (ref 8.9–10.3)
Creatinine, Ser: 0.92 mg/dL (ref 0.44–1.00)
GFR, EST NON AFRICAN AMERICAN: 54 mL/min — AB (ref >60)
Glucose, Bld: 117 mg/dL — ABNORMAL HIGH (ref 70–99)
Potassium: 4 mmol/L (ref 3.5–5.1)
Sodium: 142 mmol/L (ref 135–145)
Total Bilirubin: 0.9 mg/dL (ref 0.3–1.2)
Total Protein: 6.5 g/dL (ref 6.5–8.1)

## 2018-05-26 LAB — CBC WITH DIFFERENTIAL/PLATELET
ABS IMMATURE GRANULOCYTES: 0 10*3/uL (ref 0.0–0.1)
Basophils Absolute: 0.1 10*3/uL (ref 0.0–0.1)
Basophils Relative: 1 %
Eosinophils Absolute: 0.3 10*3/uL (ref 0.0–0.7)
Eosinophils Relative: 4 %
HCT: 47.9 % — ABNORMAL HIGH (ref 36.0–46.0)
Hemoglobin: 14.4 g/dL (ref 12.0–15.0)
Immature Granulocytes: 0 %
Lymphocytes Relative: 34 %
Lymphs Abs: 2.3 10*3/uL (ref 0.7–4.0)
MCH: 26.6 pg (ref 26.0–34.0)
MCHC: 30.1 g/dL (ref 30.0–36.0)
MCV: 88.4 fL (ref 78.0–100.0)
MONO ABS: 0.6 10*3/uL (ref 0.1–1.0)
Monocytes Relative: 10 %
NEUTROS ABS: 3.5 10*3/uL (ref 1.7–7.7)
NEUTROS PCT: 51 %
Platelets: 169 10*3/uL (ref 150–400)
RBC: 5.42 MIL/uL — ABNORMAL HIGH (ref 3.87–5.11)
RDW: 18.6 % — ABNORMAL HIGH (ref 11.5–15.5)
WBC: 6.8 10*3/uL (ref 4.0–10.5)

## 2018-05-26 LAB — MAGNESIUM: Magnesium: 1.8 mg/dL (ref 1.7–2.4)

## 2018-05-26 LAB — PHOSPHORUS: PHOSPHORUS: 3.1 mg/dL (ref 2.5–4.6)

## 2018-05-26 MED ORDER — FUROSEMIDE 40 MG PO TABS: 40.0000 mg | ORAL_TABLET | Freq: Every day | ORAL | 0 refills | Status: DC | PRN

## 2018-05-26 NOTE — Progress Notes (Signed)
Physical Therapy Treatment Patient Details Name: Raven Gray MRN: [id number] DOB: 04-Apr-1928 Today's Date: 05/26/2018    History of Present Illness Pt is an 84 y.o. female admitted 05/23/18 for pres-syncopal evaluation after being found going in and out of consciousness and feeling lightheaded. Worked up for orthostatic hypotension versus a-fib versus RVR. PMH includes tachycardia-bradycardia syndrome s/p pacemaker, PE, CHF, COPD (2L home O2 PRN), TIA.   PT Comments    Pt progressing with mobility. Today's session focused on transfers and ADL tasks as pt preparing for discharge this afternoon. Pt requires close min guard for mobility with RW; dependent for pericare and LB dressing. Daughter present throughout session; reports no further questions or concerns, and no DME needs. Continue to recommend HHPT services.    Follow Up Recommendations  Home health PT;Supervision/Assistance - 24 hour     Equipment Recommendations  None recommended by PT    Recommendations for Other Services       Precautions / Restrictions Precautions Precautions: Fall Precaution Comments: Orthostatic hypotension Restrictions Weight Bearing Restrictions: No    Mobility  Bed Mobility Overal bed mobility: Independent                Transfers Overall transfer level: Needs assistance Equipment used: Rolling walker (2 wheeled) Transfers: Sit to/from Stand Sit to Stand: Min guard         General transfer comment: Repeated cues for correct hand placement. Min guard for balance; stood 3x from bed and 2x from [facility] for pericare and donning clothes  Ambulation/Gait Ambulation/Gait assistance: Min guard Gait Distance (Feet): 3 Feet Assistive device: Rolling walker (2 wheeled) Gait Pattern/deviations: Step-to pattern;Leaning posteriorly;Trunk flexed Gait velocity: Decreased Gait velocity interpretation: <1.31 ft/sec, indicative of household ambulator General Gait Details: Took steps from bed<>BSC with RW  and close min guard for balance; assist to navigate RW and cues for safety   Stairs             Wheelchair Mobility    Modified Rankin (Stroke Patients Only)       Balance Overall balance assessment: Needs assistance Sitting-balance support: No upper extremity supported;Feet supported Sitting balance-Leahy Scale: Fair Sitting balance - Comments: Poor core control/strength, easily fatigued sitting EOB needing to lean back for support Postural control: Posterior lean Standing balance support: Bilateral upper extremity supported;During functional activity Standing balance-Leahy Scale: Poor Standing balance comment: Reliant on UE support                            Cognition Arousal/Alertness: Awake/alert Behavior During Therapy: WFL for tasks assessed/performed Overall Cognitive Status: History of cognitive impairments - at baseline Area of Impairment: Attention;Memory;Safety/judgement;Awareness;Problem solving                   Current Attention Level: Selective Memory: Decreased short-term memory   Safety/Judgement: Decreased awareness of safety Awareness: Emergent Problem Solving: Requires verbal cues        Exercises      General Comments General comments (skin integrity, edema, etc.): Daughter present during session      Pertinent Vitals/Pain Pain Assessment: No/denies pain    Home Living                      Prior Function            PT Goals (current goals can now be found in the care plan section) Acute Rehab PT Goals Patient Stated Goal: to get better  PT Goal Formulation: With patient Time For Goal Achievement: 06/07/18 Potential to Achieve Goals: Good Progress towards PT goals: Progressing toward goals    Frequency    Min 3X/week      PT Plan Current plan remains appropriate    Co-evaluation              AM-PAC PT "6 Clicks" Daily Activity  Outcome Measure  Difficulty turning over in bed  (including adjusting bedclothes, sheets and blankets)?: None Difficulty moving from lying on back to sitting on the side of the bed? : None Difficulty sitting down on and standing up from a chair with arms (e.g., wheelchair, bedside commode, etc,.)?: Unable Help needed moving to and from a bed to chair (including a wheelchair)?: A Little Help needed walking in hospital room?: A Little Help needed climbing 3-5 steps with a railing? : A Lot 6 Click Score: 17    End of Session Equipment Utilized During Treatment: Gait belt Activity Tolerance: Patient tolerated treatment well Patient left: in bed;with call bell/phone within reach;with family/visitor present Nurse Communication: Mobility status PT Visit Diagnosis: Unsteadiness on feet (R26.81);Muscle weakness (generalized) (M62.81)     Time: [id number] PT Time Calculation (min) (ACUTE ONLY): 24 min  Charges:  $Therapeutic Activity: 23-37 mins                    Mabeline Caras, PT, DPT Acute Rehabilitation Services  Pager [phone] Office [place] 05/26/2018, 1:09 PM

## 2018-05-26 NOTE — Discharge Summary (Signed)
Physician Discharge Summary  KEARIE MENNEN HFW:[id number] DOB: 1928/05/04 DOA: 05/23/2018  PCP: Janie Morning, DO  Admit date: 05/23/2018 Discharge date: 05/26/2018  Admitted From: home Disposition:  home  Recommendations for Outpatient Follow-up:  1. Follow up with PCP in 1-2 weeks  Home Health: PT Equipment/Devices: none  Discharge Condition: stable CODE STATUS: DNR Diet recommendation: regular  HPI: Per Dr. Nevada Crane, Raven Gray is a 84 y.o. female with medical history significant for TIA, tachycardia-bradycardia syndrome status post pacemaker placement, hypertension, pulmonary embolism not on anticoagulation due to high risk fall and bleeding, chronic diastolic CHF, COPD on 2L oxygen as needed who presented to ED [facility] from home for presyncopal evaluation.  This afternoon around noon was going in and out of consciousness at home in her chair, lasting around 7 minutes total and witnessed by her caretaker.  Patient states she felt lightheaded.  Denies falling. During this visit the patient is alert and oriented x3.  She denies any chest pain, palpitation, dyspnea.  She presented to the ED [facility] yesterday with complaints of right lower quadrant abdominal pain associated with nausea.  CT abdomen and pelvis with contrast showed constipation and right sided nephrolithiasis.  Her symptoms are now resolved.  She denies any chills or fevers.  States her last bowel movement was yesterday morning.  She is in the room accompanied by her daughter and granddaughter.  Reports generalized weakness and dry mouth with poor oral intake. ED Course: On presentation to the ED heart rate in the 30s and bouncing back to normal.  Pacemaker interrogated awaiting results.  Lab studies unremarkable.  Negative UA.  Twelve-lead EKG independently reviewed revealed A. fib with rate of 100 and QTC prolongation 516.  CT head no contrast unremarkable for any acute intracranial findings.  TRH asked to admit for presyncope work-up.  Hospital  Course: Syncope -patient was admitted to the hospital with a syncopal episode.  Cardiology was consulted and followed patient while hospitalized.  Her pacemaker was interrogated and there were no significant events noted.  She was monitored on telemetry without acute arrhythmias.  Her syncope was likely thought to be due to orthostatic hypotension in the setting of standing Lasix dose.  Per cardiology, will change her Lasix from daily to as needed for fluid overload.  Recommend daily weights as an outpatient and ongoing follow-up.  She had orthostatic vital signs on admission, she received IV fluids and improved.  She is asymptomatic today, able to ambulate without difficulties, will discharge home in stable condition. Repeat ECHO done this Visit and as below and showed EF of 50-55%. Generalized weakness/physical debility -home health PT on discharge Right nephrolithiasis -asymptomatic Infrarenal aorta aneurysm -incidental finding, outpatient follow-up with History of tachybradycardia status post permanent pacemaker in July 2018 -cardiology evaluated, will continue ongoing outpatient follow-up Chronic kidney disease stage III -her creatinine has remained stable Chronic diastolic CHF -she is euvolemic, in fact dehydrated on admission, improved with fluids.  Discontinue standing Lasix and change to PRN  Discharge Diagnoses:  Active Problems:   COPD (chronic obstructive pulmonary disease) (HCC)   Essential hypertension   Depression   Dehydration   Loss of consciousness (HCC)   Tachycardia-bradycardia syndrome (HCC)   Chronic atrial fibrillation (HCC)   Pacemaker   History of pulmonary embolism   Syncope   HLD (hyperlipidemia)   Pre-syncope     Discharge Instructions   Allergies as of 05/26/2018   No Known Allergies     Medication List  TAKE these medications   acetaminophen 325 MG tablet Commonly known as:  TYLENOL Take 650 mg by mouth every 6 (six) hours as needed (for pain).     ALPRAZolam 0.25 MG tablet Commonly known as:  XANAX Take 1 tablet (0.25 mg total) by mouth 3 (three) times daily as needed for anxiety (anxiety). What changed:  reasons to take this Notes to patient:  Last given 9/3 1639   furosemide 40 MG tablet Commonly known as:  LASIX Take 1 tablet (40 mg total) by mouth daily as needed. Take 1 tablet (40 mg) two times a day for 3 days. Then 1 tablet by mouth daily. What changed:    when to take this  reasons to take this   MAGNESIUM PO Take 1 tablet by mouth at bedtime.   MELATONIN PO Take 1 tablet by mouth at bedtime as needed (sleep).   metoprolol tartrate 25 MG tablet Commonly known as:  LOPRESSOR Take 1 tablet (25 mg total) by mouth daily.   montelukast 10 MG tablet Commonly known as:  SINGULAIR Take 10 mg by mouth at bedtime.   multivitamin with minerals Tabs tablet Take 1 tablet by mouth daily.   nitroGLYCERIN 0.4 MG SL tablet Commonly known as:  NITROSTAT Place 0.4 mg under the tongue every 5 (five) minutes as needed for chest pain. X 3 doses   omeprazole 20 MG capsule Commonly known as:  PRILOSEC Take 20 mg by mouth daily as needed (for reflux/heartburn).   ondansetron 4 MG disintegrating tablet Commonly known as:  ZOFRAN-ODT Take 1 tablet (4 mg total) by mouth every 8 (eight) hours as needed for nausea.   OXYGEN Inhale 2 L into the lungs daily as needed (shortness of breath).   polyethylene glycol powder powder Commonly known as:  GLYCOLAX/MIRALAX Take 17 g by mouth 2 (two) times daily. Until daily soft stools OTC   potassium chloride SA 20 MEQ tablet Commonly known as:  K-DUR,KLOR-CON Take 1 tablet (20 mEq total) by mouth daily as needed (along with lasix.). What changed:  when to take this   pravastatin 40 MG tablet Commonly known as:  PRAVACHOL Take 40 mg by mouth daily.   traZODone 50 MG tablet Commonly known as:  DESYREL Take 50 mg by mouth at bedtime.   venlafaxine XR 150 MG 24 hr capsule Commonly  known as:  EFFEXOR-XR Take 150 mg by mouth See admin instructions. Take one capsule (150 mg) by mouth with a 75 mg capsule for a total dose of 225 mg daily   venlafaxine XR 75 MG 24 hr capsule Commonly known as:  EFFEXOR-XR Take 75 mg by mouth See admin instructions. Take one capsule (75 mg) by mouth with a 150 mg capsule for a total dose of 225 mg daily      Follow-up Information    [facility] Follow [facility].   Why:  [facility] Contact information: [address] [place] [address] [phone]        [place] [place] Follow up.   Specialty:  [facility] Contact information: [place] [address] [phone]           Consultations:    Procedures/Studies:  2D echo  Study Conclusions - Left ventricle: The cavity size was normal. Wall thickness wasnormal. Systolic function was normal. The estimated ejectionfraction was in the range of 50% to 55%. - Aortic valve: There  was moderate regurgitation. Valve area(Vmax): 1.29 cm^2. - Mitral valve: Mildly calcified annulus. Moderately thickenedleaflets . - Left atrium: The atrium was moderately dilated. - Right atrium: The atrium was severely dilated. - Tricuspid valve: There was mild-moderate regurgitation. - Pulmonary arteries: Systolic pressure was moderately to severelyincreased. PA peak pressure: 62 mm Hg (S).  CAROTID DUPLEX  Carotid duplex prelim: 1-39% ICA stenosis.   Dg Chest 2 View  Result Date: 05/23/2018 CLINICAL DATA:  84 year old who had a syncopal episode last night while sitting in a chair. Patient with orthostatic hypotension upon EMS arrival. EXAM: CHEST - 2 VIEW COMPARISON:  03/16/2018, 11/06/2017 and earlier, including CTA chest 11/06/2017. FINDINGS: Cardiac silhouette markedly enlarged, unchanged. LEFT subclavian single lead transvenous pacemaker with the lead tip at  the RV apex, unchanged and intact. Large hiatal hernia. Thoracic aorta atherosclerotic, unchanged. Hilar and mediastinal contours otherwise unremarkable. Mild atelectasis involving the lower lobes. Lungs otherwise clear. Bronchovascular markings normal. Pulmonary vascularity normal. No pleural effusions. Degenerative changes involving the thoracic spine. Osseous demineralization. Severe degenerative changes in both shoulder joints. IMPRESSION: 1. Stable marked cardiomegaly. Mild atelectasis involving the lower lobes. No acute cardiopulmonary disease otherwise. 2. Stable large hiatal hernia. Electronically Signed   By: Evangeline Dakin M.D.   On: 05/23/2018 16:56   Ct Head Wo Contrast  Result Date: 05/23/2018 CLINICAL DATA:  84 year old female with history of syncopal episode today. EXAM: CT HEAD WITHOUT CONTRAST TECHNIQUE: Contiguous axial images were obtained from the base of the skull through the vertex without intravenous contrast. COMPARISON:  Head CT 11/06/2017. FINDINGS: Brain: Mild to moderate cerebral atrophy. Patchy and confluent areas of decreased attenuation are noted throughout the deep and periventricular white matter of the cerebral hemispheres bilaterally, compatible with chronic microvascular ischemic disease. No evidence of acute infarction, hemorrhage, hydrocephalus, extra-axial collection or mass lesion/mass effect. Vascular: No hyperdense vessel or unexpected calcification. Skull: Normal. Negative for fracture or focal lesion. Sinuses/Orbits: No acute finding. Other: None. IMPRESSION: 1. No acute intracranial abnormalities. 2. Mild to moderate cerebral atrophy. 3. Chronic microvascular ischemic changes in the cerebral white matter, similar to the prior examination. Electronically Signed   By: Vinnie Langton M.D.   On: 05/23/2018 17:24   Ct Abdomen Pelvis W Contrast  Result Date: 05/23/2018 CLINICAL DATA:  Lower abdominal pain, nausea and vomiting for 1 day. Status post cholecystectomy  July 2019. History of hysterectomy. EXAM: CT ABDOMEN AND PELVIS WITH CONTRAST TECHNIQUE: Multidetector CT imaging of the abdomen and pelvis was performed using the standard protocol following bolus administration of intravenous contrast. CONTRAST:  57mL OMNIPAQUE IOHEXOL 300 MG/ML  SOLN COMPARISON:  CTA abdomen and pelvis March 29, 2018 FINDINGS: Mild motion degraded examination. LOWER CHEST: Lung bases are clear. Stable cardiomegaly. No pericardial effusion. Pacemaker wires in place. No pericardial effusion. HEPATOBILIARY: Subcentimeter hypodensities in the liver most compatible with cysts. No intrahepatic biliary dilatation. Status post cholecystectomy. No fluid collections within gallbladder fossa. PANCREAS: Normal. SPLEEN: Normal. ADRENALS/URINARY TRACT: Kidneys are orthotopic, demonstrating symmetric enhancement. Mildly atrophic LEFT kidney. Bilateral renal scarring. 6 mm RIGHT interpolar nephrolithiasis. No hydronephrosis or solid renal masses. 2.8 cm homogeneously hypodense benign-appearing cyst RIGHT upper pole. 14 mm RIGHT lower pole cyst. RIGHT parapelvic cysts. The unopacified ureters are normal in course and caliber. Delayed imaging through the kidneys demonstrates symmetric prompt contrast excretion within the proximal urinary collecting system. Urinary bladder is partially distended and unremarkable. Normal adrenal glands. STOMACH/BOWEL: Large hiatal hernia. Small large bowel are normal in caliber. Mobile cecum located in central abdomen  with normal appendix RIGHT upper quadrant. Moderate retained large bowel stool. Mild diverticulosis. VASCULAR/LYMPHATIC: Stable 2.6 cm ectatic aorta. Moderate calcific atherosclerosis. No lymphadenopathy by CT size criteria. REPRODUCTIVE: Status post hysterectomy. OTHER: No intraperitoneal free fluid or free air. MUSCULOSKELETAL: Nonacute. Thoracolumbar dextroscoliosis, L4-5 interspinous prosthesis. Severe RIGHT L5-S1 neural foraminal narrowing. Mild old L2 compression  fracture and severe degenerative change of the lumbar spine. IMPRESSION: 1. No acute intra-abdominal or pelvic process on this motion degraded examination. Moderate retained large bowel stool. 2. 6 mm non-obstructing RIGHT nephrolithiasis. Mildly atrophic LEFT kidney. 3. Status post cholecystectomy without complication. Status post hysterectomy. 4. Stable 2.6 cm ectatic infrarenal aorta at risk for aneurysm development. Recommend followup by ultrasound in 5 years. This recommendation follows ACR consensus guidelines: White Paper of the ACR Incidental Findings Committee II on Vascular Findings. J Am Coll Radiol 2013; 10:789-794. Aortic Atherosclerosis (ICD10-I70.0). Electronically Signed   By: Elon Alas M.D.   On: 05/23/2018 00:21     Subjective: - no chest pain, shortness of breath, no abdominal pain, nausea or vomiting.   Discharge Exam: Vitals:   05/25/18 2317 05/26/18 0723  BP: 137/89 (!) 175/97  Pulse: 92 64  Resp: 15 14  Temp: 98.1 F (36.7 C) 97.8 F (36.6 C)  SpO2: 100% 99%    General: Pt is alert, awake, not in acute distress Cardiovascular: RRR, S1/S2 +, no rubs, no gallops Respiratory: CTA bilaterally, no wheezing, no rhonchi Abdominal: Soft, NT, ND, bowel sounds + Extremities: no edema, no cyanosis    The results of significant diagnostics from this hospitalization (including imaging, microbiology, ancillary and laboratory) are listed below for reference.     Microbiology: Recent Results (from the past 240 hour(s))  Urine culture     Status: Abnormal   Collection Time: 05/23/18  4:52 PM  Result Value Ref Range Status   Specimen Description URINE, RANDOM  Final   Special Requests   Final    NONE Performed at [facility], [address], [place], [place] [address]    Culture MULTIPLE SPECIES PRESENT, SUGGEST RECOLLECTION (A)  Final   Report Status 05/24/2018 FINAL  Final     Labs: BNP (last 3 results) Recent Labs    07/01/17 0825 11/06/17 0946  03/29/18 0131  BNP 555.2* 450.1* 591.6*   Basic Metabolic Panel: Recent Labs  Lab 05/22/18 2155 05/23/18 1457 05/24/18 0334 05/25/18 0358 05/26/18 0254  NA 139 140 142 143 142  K 3.7 4.2 4.2 3.2* 4.0  CL 101 105 105 105 106  CO2 28 28 31 31 29   GLUCOSE 170* 124* 99 122* 117*  BUN 15 13 12 8 9   CREATININE 1.19* 1.02* 0.96 0.99 0.92  CALCIUM 8.8* 8.2* 8.8* 8.9 9.0  MG  --   --  1.9 1.7 1.8  PHOS  --   --   --  2.7 3.1   Liver Function Tests: Recent Labs  Lab 05/22/18 2155 05/24/18 0334 05/25/18 0358 05/26/18 0254  AST 24 23 21 21   ALT 16 14 13 12   ALKPHOS 94 90 87 90  BILITOT 0.8 0.8 0.8 0.9  PROT 6.4* 6.0* 6.4* 6.5  ALBUMIN 3.2* 2.9* 3.1* 3.2*   Recent Labs  Lab 05/22/18 2155  LIPASE 29   No results for input(s): AMMONIA in the last 168 hours. CBC: Recent Labs  Lab 05/22/18 2155 05/23/18 1457 05/24/18 0334 05/25/18 0358 05/26/18 0254  WBC 6.5 5.9 5.8 6.0 6.8  NEUTROABS  --  3.4  --  3.3 3.5  HGB 13.6 12.8 13.2 13.7 14.4  HCT 45.2 42.3 43.7 44.9 47.9*  MCV 88.6 89.2 89.2 87.2 88.4  PLT 184 151 152 156 169   Cardiac Enzymes: Recent Labs  Lab 05/23/18 1516  TROPONINI <0.03   BNP: Invalid input(s): POCBNP CBG: No results for input(s): GLUCAP in the last 168 hours. D-Dimer No results for input(s): DDIMER in the last 72 hours. Hgb A1c No results for input(s): HGBA1C in the last 72 hours. Lipid Profile No results for input(s): CHOL, HDL, LDLCALC, TRIG, CHOLHDL, LDLDIRECT in the last 72 hours. Thyroid function studies No results for input(s): TSH, T4TOTAL, T3FREE, THYROIDAB in the last 72 hours.  Invalid input(s): FREET3 Anemia work up No results for input(s): VITAMINB12, FOLATE, FERRITIN, TIBC, IRON, RETICCTPCT in the last 72 hours. Urinalysis    Component Value Date/Time   COLORURINE STRAW (A) 05/23/2018 1646   APPEARANCEUR HAZY (A) 05/23/2018 1646   LABSPEC 1.013 05/23/2018 1646   PHURINE 6.0 05/23/2018 1646   GLUCOSEU NEGATIVE  05/23/2018 1646   HGBUR NEGATIVE 05/23/2018 1646   BILIRUBINUR NEGATIVE 05/23/2018 1646   KETONESUR NEGATIVE 05/23/2018 1646   PROTEINUR NEGATIVE 05/23/2018 1646   UROBILINOGEN 1.0 10/20/2014 1614   NITRITE NEGATIVE 05/23/2018 1646   LEUKOCYTESUR NEGATIVE 05/23/2018 1646   Sepsis Labs Invalid input(s): PROCALCITONIN,  WBC,  LACTICIDVEN   Time coordinating discharge: 35 minutes  SIGNED:  Marzetta Board, MD  [facility] 05/26/2018, 2:15 PM Pager [phone]  If 7PM-7AM, please contact night-coverage [email or web address] Password [email or web address]

## 2018-05-26 NOTE — Progress Notes (Signed)
Progress Note  Patient Name: Raven Gray Date of Encounter: 05/26/2018  Primary Cardiologist:   Quay Burow, MD   Subjective   No chest pain.  Mild SOB this AM.  She wants to go home.   Inpatient Medications    Scheduled Meds: . enoxaparin (LOVENOX) injection  40 mg Subcutaneous Q24H  . metoprolol tartrate  12.5 mg Oral BID  . montelukast  10 mg Oral QHS  . multivitamin with minerals  1 tablet Oral Daily  . pantoprazole  40 mg Oral Daily  . polyethylene glycol  17 g Oral BID  . pravastatin  40 mg Oral Daily  . senna-docusate  1 tablet Oral BID  . traZODone  50 mg Oral QHS  . venlafaxine XR  150 mg Oral Daily   And  . venlafaxine XR  75 mg Oral Daily   Continuous Infusions:  PRN Meds: acetaminophen, ALPRAZolam, ondansetron (ZOFRAN) IV, oxyCODONE   Vital Signs    Vitals:   05/25/18 1750 05/25/18 2317 05/26/18 0723 05/26/18 0921  BP: (!) 159/104 137/89 (!) 175/97   Pulse: 83 92 64   Resp:  15 14   Temp: 97.8 F (36.6 C) 98.1 F (36.7 C) 97.8 F (36.6 C)   TempSrc: Oral Oral Oral   SpO2: 99% 100% 99%   Weight:    66.9 kg  Height:        Intake/Output Summary (Last 24 hours) at 05/26/2018 1121 Last data filed at 05/26/2018 0900 Gross per 24 hour  Intake 147.34 ml  Output 200 ml  Net -52.66 ml   Filed Weights   05/25/18 0209 05/25/18 0417 05/26/18 0921  Weight: 69.2 kg 68 kg 66.9 kg    Telemetry    Atrial fib with rate controlled and demand pacing.  - Personally Reviewed  ECG    NA - Personally Reviewed  Physical Exam   GEN: No acute distress.   Neck: No  JVD Cardiac: Irregular RR, no murmurs, rubs, or gallops.  Respiratory: Clear to auscultation bilaterally. GI: Soft, nontender, non-distended  MS: No  edema; No deformity. Neuro:  Nonfocal  Psych: Normal affect   Labs    Chemistry Recent Labs  Lab 05/24/18 0334 05/25/18 0358 05/26/18 0254  NA 142 143 142  K 4.2 3.2* 4.0  CL 105 105 106  CO2 31 31 29   GLUCOSE 99 122* 117*  BUN 12  8 9   CREATININE 0.96 0.99 0.92  CALCIUM 8.8* 8.9 9.0  PROT 6.0* 6.4* 6.5  ALBUMIN 2.9* 3.1* 3.2*  AST 23 21 21   ALT 14 13 12   ALKPHOS 90 87 90  BILITOT 0.8 0.8 0.9  GFRNONAA 51* 49* 54*  GFRAA 59* 57* >60  ANIONGAP 6 7 7      Hematology Recent Labs  Lab 05/24/18 0334 05/25/18 0358 05/26/18 0254  WBC 5.8 6.0 6.8  RBC 4.90 5.15* 5.42*  HGB 13.2 13.7 14.4  HCT 43.7 44.9 47.9*  MCV 89.2 87.2 88.4  MCH 26.9 26.6 26.6  MCHC 30.2 30.5 30.1  RDW 18.6* 18.2* 18.6*  PLT 152 156 169    Cardiac Enzymes Recent Labs  Lab 05/23/18 1516  TROPONINI <0.03   No results for input(s): TROPIPOC in the last 168 hours.   BNPNo results for input(s): BNP, PROBNP in the last 168 hours.   DDimer No results for input(s): DDIMER in the last 168 hours.   Radiology    No results found.  Cardiac Studies   ECHO  Study Conclusions  -  Left ventricle: The cavity size was normal. Wall thickness was   normal. Systolic function was normal. The estimated ejection   fraction was in the range of 50% to 55%. - Aortic valve: There was moderate regurgitation. Valve area   (Vmax): 1.29 cm^2. - Mitral valve: Mildly calcified annulus. Moderately thickened   leaflets . - Left atrium: The atrium was moderately dilated. - Right atrium: The atrium was severely dilated. - Tricuspid valve: There was mild-moderate regurgitation. - Pulmonary arteries: Systolic pressure was moderately to severely   increased. PA peak pressure: 62 mm Hg (S).  Patient Profile     84 y.o. female with a hx of atrial fibrillation, PE/DVT previously on xarelto but stopped 10/2017 due to age and fall risk, chronic diastolic heart failure, COPD on home PRN O2, HTN, HLD, prior stroke, tachy-brady syndrome s/p PPM in place, and  who is being seen for the evaluation of syncope at the request of Dr. Alfredia Ferguson.  Assessment & Plan    SYNCOPE:  See consult note.  Etiology not clear but she does have objective evidence of orthostatic  hypotension which is discussed below.  ORTHOSTATIC HYPOTENSION:  Conservative therapy.  Change Lasix to PRN.  Compression stockings plus/minus abdominal binder.  Avoid prolonged standing etc.    ATRIAL FIB:  The family will reconsider anticoagulation with Dr. Gwenlyn Found at a future appt.  Beta blocker restarted for rate control.    No change in therapy.   OK to go home form our standpoint.    For questions or updates, please contact [place] Please consult [email or web address] for contact info under Cardiology/STEMI.   Signed, Minus Breeding, MD  05/26/2018, 11:21 AM

## 2018-05-30 DIAGNOSIS — J449 Chronic obstructive pulmonary disease, unspecified: Secondary | ICD-10-CM | POA: Diagnosis not present

## 2018-06-01 ENCOUNTER — Ambulatory Visit: Payer: Medicare Other | Admitting: Cardiovascular Disease

## 2018-06-01 ENCOUNTER — Encounter: Payer: Self-pay | Admitting: Cardiovascular Disease

## 2018-06-01 DIAGNOSIS — I482 Chronic atrial fibrillation, unspecified: Secondary | ICD-10-CM

## 2018-06-01 DIAGNOSIS — N183 Chronic kidney disease, stage 3 (moderate): Secondary | ICD-10-CM | POA: Diagnosis not present

## 2018-06-01 DIAGNOSIS — I351 Nonrheumatic aortic (valve) insufficiency: Secondary | ICD-10-CM

## 2018-06-01 DIAGNOSIS — I495 Sick sinus syndrome: Secondary | ICD-10-CM

## 2018-06-01 DIAGNOSIS — I10 Essential (primary) hypertension: Secondary | ICD-10-CM | POA: Diagnosis not present

## 2018-06-01 DIAGNOSIS — E7800 Pure hypercholesterolemia, unspecified: Secondary | ICD-10-CM | POA: Diagnosis not present

## 2018-06-01 DIAGNOSIS — J439 Emphysema, unspecified: Secondary | ICD-10-CM | POA: Diagnosis not present

## 2018-06-01 DIAGNOSIS — K219 Gastro-esophageal reflux disease without esophagitis: Secondary | ICD-10-CM | POA: Diagnosis not present

## 2018-06-01 DIAGNOSIS — I5032 Chronic diastolic (congestive) heart failure: Secondary | ICD-10-CM | POA: Diagnosis not present

## 2018-06-01 DIAGNOSIS — J9611 Chronic respiratory failure with hypoxia: Secondary | ICD-10-CM | POA: Diagnosis not present

## 2018-06-01 DIAGNOSIS — I951 Orthostatic hypotension: Secondary | ICD-10-CM | POA: Diagnosis not present

## 2018-06-01 DIAGNOSIS — N200 Calculus of kidney: Secondary | ICD-10-CM | POA: Diagnosis not present

## 2018-06-01 DIAGNOSIS — I700 Atherosclerosis of aorta: Secondary | ICD-10-CM | POA: Diagnosis not present

## 2018-06-01 DIAGNOSIS — I130 Hypertensive heart and chronic kidney disease with heart failure and stage 1 through stage 4 chronic kidney disease, or unspecified chronic kidney disease: Secondary | ICD-10-CM | POA: Diagnosis not present

## 2018-06-01 NOTE — Assessment & Plan Note (Signed)
History of essential hypertension her blood pressure measured today 139/68.  She is on metoprolol.  Continue current meds at current dosing.

## 2018-06-01 NOTE — Progress Notes (Signed)
06/01/2018 KRISTOPHER DELK   Dec 08, 1927  [id number]  Primary Physician Janie Morning, DO Primary Cardiologist: Lorretta Harp MD [name], [place]  HPI:  Raven Gray is a 84 y.o.  moderately overweight married Caucasian female mother of 4 daughters, grandmother to 64 grandchildren was accompanied by one of her daughters [name] today. I last saw her in the office 11/27/2017.Marland Kitchen She unfortunately lost her oldest daughter June 2016 2 diabetes.She was referred by Dr. Wilson Singer for new-onset A. Fib and chest pain. The patient has a remote history of a pulmonary embolism and DVT on Xarelto oral autoregulation. Problems include hypertension, hyperlipidemia and COPD. She does have a family history of heart disease in the father who died of an MI. She does have a daughter who's had stents. She has never had a heart attack or stroke. Over the last month she said it was a chest pain which awakened her from sleep. She also feels weak and sluggish. An EKG performed by her primary care physician revealed atrial fibrillation which was newly recognized. I performed a 2-D echo cardiogram which was normal as was a Myoview stress test. She has had several ER visits/admissions for atypical chest pain and shortness of breath. A recent 2-D echo performed 10/21/14 revealed normal LV size and function, moderate aortic insufficiency with a mildly dilated left atrium. We have discussed the possibility of outpatient cardioversion however the patient does not wish to pursue this at this time. Because of ongoing chest pain she had a Myoview stress test performed 02/22/16 which was low risk. Because of ongoing chest pain which does have ischemic discuss the possibility of performing outpatient cardiac catheterization which the patient currently does not wish to pursue. As result of that I elected to increase her Imdur from 30-60 mg a day and will follow her clinically closely as an outpatient.I performed event monitoring which showed A. fib  with long pauses. She also had a permanent transvenous pacemaker placed by Dr. Sallyanne Kuster 04/01/17. She does get occasional chest pain but does not wish to have any further testing for this nor does she wish to have anything interventional done.  She was admitted to the hospital on 2/15 with a fall. She apparently injured her nose. It was decided at that time to discontinue oral hydration because of ongoing fall risk. She is currently being cared for by her 2 daughters.  I saw her 6 months ago she was recently hospitalized again for a fall.  Etiology was unclear.  Her medicines were somewhat adjusted and her Lasix was changed to as needed.  2D echo was normal.  Her oral anticoagulation was stopped because of fall risk.    Current Meds  Medication Sig  . acetaminophen (TYLENOL) 325 MG tablet Take 650 mg by mouth every 6 (six) hours as needed (for pain).   Marland Kitchen ALPRAZolam (XANAX) 0.25 MG tablet Take 1 tablet (0.25 mg total) by mouth 3 (three) times daily as needed for anxiety (anxiety). (Patient taking differently: Take 0.25 mg by mouth 3 (three) times daily as needed for anxiety. )  . furosemide (LASIX) 40 MG tablet Take 1 tablet (40 mg total) by mouth daily as needed. Take 1 tablet (40 mg) two times a day for 3 days. Then 1 tablet by mouth daily.  Marland Kitchen MAGNESIUM PO Take 1 tablet by mouth at bedtime.   Marland Kitchen MELATONIN PO Take 1 tablet by mouth at bedtime as needed (sleep).   . metoprolol tartrate (LOPRESSOR) 25 MG  tablet Take 1 tablet (25 mg total) by mouth daily.  . montelukast (SINGULAIR) 10 MG tablet Take 10 mg by mouth at bedtime.  . Multiple Vitamin (MULTIVITAMIN WITH MINERALS) TABS tablet Take 1 tablet by mouth daily.  . nitroGLYCERIN (NITROSTAT) 0.4 MG SL tablet Place 0.4 mg under the tongue every 5 (five) minutes as needed for chest pain. X 3 doses  . omeprazole (PRILOSEC) 20 MG capsule Take 20 mg by mouth daily as needed (for reflux/heartburn).   . ondansetron (ZOFRAN ODT) 4 MG disintegrating tablet  Take 1 tablet (4 mg total) by mouth every 8 (eight) hours as needed for nausea.  . OXYGEN Inhale 2 L into the lungs daily as needed (shortness of breath).   . polyethylene glycol powder (GLYCOLAX/MIRALAX) powder Take 17 g by mouth 2 (two) times daily. Until daily soft stools OTC  . potassium chloride SA (K-DUR,KLOR-CON) 20 MEQ tablet Take 1 tablet (20 mEq total) by mouth daily as needed (along with lasix.). (Patient taking differently: Take 20 mEq by mouth daily. )  . pravastatin (PRAVACHOL) 40 MG tablet Take 40 mg by mouth daily.   . traZODone (DESYREL) 50 MG tablet Take 50 mg by mouth at bedtime.   Marland Kitchen venlafaxine XR (EFFEXOR-XR) 150 MG 24 hr capsule Take 150 mg by mouth See admin instructions. Take one capsule (150 mg) by mouth with a 75 mg capsule for a total dose of 225 mg daily  . venlafaxine XR (EFFEXOR-XR) 75 MG 24 hr capsule Take 75 mg by mouth See admin instructions. Take one capsule (75 mg) by mouth with a 150 mg capsule for a total dose of 225 mg daily     No Known Allergies  Social History   Socioeconomic History  . Marital status: Widowed    Spouse name: Not on file  . Number of children: Not on file  . Years of education: Not on file  . Highest education level: Not on file  Occupational History  . Not on file  Social Needs  . Financial resource strain: Not on file  . Food insecurity:    Worry: Not on file    Inability: Not on file  . Transportation needs:    Medical: Not on file    Non-medical: Not on file  Tobacco Use  . Smoking status: Never Smoker  . Smokeless tobacco: Never Used  Substance and Sexual Activity  . Alcohol use: No  . Drug use: No  . Sexual activity: Not Currently    Birth control/protection: None  Lifestyle  . Physical activity:    Days per week: Not on file    Minutes per session: Not on file  . Stress: Not on file  Relationships  . Social connections:    Talks on phone: Not on file    Gets together: Not on file    Attends religious  service: Not on file    Active member of club or organization: Not on file    Attends meetings of clubs or organizations: Not on file    Relationship status: Not on file  . Intimate partner violence:    Fear of current or ex partner: Not on file    Emotionally abused: Not on file    Physically abused: Not on file    Forced sexual activity: Not on file  Other Topics Concern  . Not on file  Social History Narrative  . Not on file     Review of Systems: General: negative for chills, fever, night  sweats or weight changes.  Cardiovascular: negative for chest pain, dyspnea on exertion, edema, orthopnea, palpitations, paroxysmal nocturnal dyspnea or shortness of breath Dermatological: negative for rash Respiratory: negative for cough or wheezing Urologic: negative for hematuria Abdominal: negative for nausea, vomiting, diarrhea, bright red blood per rectum, melena, or hematemesis Neurologic: negative for visual changes, syncope, or dizziness All other systems reviewed and are otherwise negative except as noted above.    Blood pressure 139/68, pulse 64, height 5\' 3"  (1.6 m), weight 151 lb 9.6 oz (68.8 kg), SpO2 94 %.  General appearance: alert and no distress Neck: no adenopathy, no carotid bruit, no JVD, supple, symmetrical, trachea midline and thyroid not enlarged, symmetric, no tenderness/mass/nodules Lungs: clear to auscultation bilaterally Heart: regular rate and rhythm, S1, S2 normal, no murmur, click, rub or gallop Extremities: extremities normal, atraumatic, no cyanosis or edema Pulses: 2+ and symmetric Skin: Skin color, texture, turgor normal. No rashes or lesions Neurologic: Alert and oriented X 3, normal strength and tone. Normal symmetric reflexes. Normal coordination and gait  EKG not performed today  ASSESSMENT AND PLAN:   Aortic insufficiency History of mild to moderate aortic insufficiency by recent 2D echo performed 05/24/2018 with normal LV function.  Essential  hypertension History of essential hypertension her blood pressure measured today 139/68.  She is on metoprolol.  Continue current meds at current dosing.  Tachycardia-bradycardia syndrome ([place]) History of tachycardia bradycardia syndrome status post permanent transvenous pacemaker insertion.  This is followed by Dr. Sallyanne Kuster.  She does have PAF however her oral anticoagulation was discontinued because of fall risk.  Chronic atrial fibrillation (HCC) Chronic atrial fibrillation rate controlled on beta-blockers not on oral anticoagulation because of fall risk.  Hyperlipidemia History of hyperlipidemia on statin therapy.      Lorretta Harp MD FACP,FACC,FAHA, [facility] 06/01/2018 11:53 AM

## 2018-06-01 NOTE — Patient Instructions (Signed)
Medication Instructions:  Your physician recommends that you continue on your current medications as directed. Please refer to the Current Medication list given to you today.   Labwork: none  Testing/Procedures: none  Follow-Up: We request that you follow-up in: 3 months with Rosaria Ferries, PA  and in 12 months with Dr Andria Rhein will receive a reminder letter in the mail two months in advance. If you don't receive a letter, please call our office to schedule the follow-up appointment.    Any Other Special Instructions Will Be Listed Below (If Applicable).     If you need a refill on your cardiac medications before your next appointment, please call your pharmacy.

## 2018-06-01 NOTE — Assessment & Plan Note (Signed)
Chronic atrial fibrillation rate controlled on beta-blockers not on oral anticoagulation because of fall risk.

## 2018-06-01 NOTE — Assessment & Plan Note (Signed)
History of tachycardia bradycardia syndrome status post permanent transvenous pacemaker insertion.  This is followed by Dr. Sallyanne Kuster.  She does have PAF however her oral anticoagulation was discontinued because of fall risk.

## 2018-06-01 NOTE — Assessment & Plan Note (Signed)
History of mild to moderate aortic insufficiency by recent 2D echo performed 05/24/2018 with normal LV function.

## 2018-06-01 NOTE — Assessment & Plan Note (Signed)
History of hyperlipidemia on statin therapy. 

## 2018-06-02 DIAGNOSIS — I5032 Chronic diastolic (congestive) heart failure: Secondary | ICD-10-CM | POA: Diagnosis not present

## 2018-06-02 DIAGNOSIS — N183 Chronic kidney disease, stage 3 (moderate): Secondary | ICD-10-CM | POA: Diagnosis not present

## 2018-06-02 DIAGNOSIS — I951 Orthostatic hypotension: Secondary | ICD-10-CM | POA: Diagnosis not present

## 2018-06-02 DIAGNOSIS — J9611 Chronic respiratory failure with hypoxia: Secondary | ICD-10-CM | POA: Diagnosis not present

## 2018-06-02 DIAGNOSIS — K219 Gastro-esophageal reflux disease without esophagitis: Secondary | ICD-10-CM | POA: Diagnosis not present

## 2018-06-02 DIAGNOSIS — J439 Emphysema, unspecified: Secondary | ICD-10-CM | POA: Diagnosis not present

## 2018-06-02 DIAGNOSIS — I482 Chronic atrial fibrillation: Secondary | ICD-10-CM | POA: Diagnosis not present

## 2018-06-02 DIAGNOSIS — N200 Calculus of kidney: Secondary | ICD-10-CM | POA: Diagnosis not present

## 2018-06-02 DIAGNOSIS — I495 Sick sinus syndrome: Secondary | ICD-10-CM | POA: Diagnosis not present

## 2018-06-02 DIAGNOSIS — I351 Nonrheumatic aortic (valve) insufficiency: Secondary | ICD-10-CM | POA: Diagnosis not present

## 2018-06-02 DIAGNOSIS — I130 Hypertensive heart and chronic kidney disease with heart failure and stage 1 through stage 4 chronic kidney disease, or unspecified chronic kidney disease: Secondary | ICD-10-CM | POA: Diagnosis not present

## 2018-06-02 DIAGNOSIS — I700 Atherosclerosis of aorta: Secondary | ICD-10-CM | POA: Diagnosis not present

## 2018-06-04 ENCOUNTER — Telehealth: Payer: Self-pay | Admitting: Cardiovascular Disease

## 2018-06-04 DIAGNOSIS — K219 Gastro-esophageal reflux disease without esophagitis: Secondary | ICD-10-CM | POA: Diagnosis not present

## 2018-06-04 DIAGNOSIS — I5032 Chronic diastolic (congestive) heart failure: Secondary | ICD-10-CM | POA: Diagnosis not present

## 2018-06-04 DIAGNOSIS — I495 Sick sinus syndrome: Secondary | ICD-10-CM | POA: Diagnosis not present

## 2018-06-04 DIAGNOSIS — I351 Nonrheumatic aortic (valve) insufficiency: Secondary | ICD-10-CM | POA: Diagnosis not present

## 2018-06-04 DIAGNOSIS — N183 Chronic kidney disease, stage 3 (moderate): Secondary | ICD-10-CM | POA: Diagnosis not present

## 2018-06-04 DIAGNOSIS — I700 Atherosclerosis of aorta: Secondary | ICD-10-CM | POA: Diagnosis not present

## 2018-06-04 DIAGNOSIS — I130 Hypertensive heart and chronic kidney disease with heart failure and stage 1 through stage 4 chronic kidney disease, or unspecified chronic kidney disease: Secondary | ICD-10-CM | POA: Diagnosis not present

## 2018-06-04 DIAGNOSIS — J9611 Chronic respiratory failure with hypoxia: Secondary | ICD-10-CM | POA: Diagnosis not present

## 2018-06-04 DIAGNOSIS — I482 Chronic atrial fibrillation: Secondary | ICD-10-CM | POA: Diagnosis not present

## 2018-06-04 DIAGNOSIS — I951 Orthostatic hypotension: Secondary | ICD-10-CM | POA: Diagnosis not present

## 2018-06-04 DIAGNOSIS — N200 Calculus of kidney: Secondary | ICD-10-CM | POA: Diagnosis not present

## 2018-06-04 DIAGNOSIS — J439 Emphysema, unspecified: Secondary | ICD-10-CM | POA: Diagnosis not present

## 2018-06-04 NOTE — Telephone Encounter (Signed)
Returned call to [place] with [facility].

## 2018-06-04 NOTE — Telephone Encounter (Signed)
New Message   Tillie Rung (Physical Therapist)  with [facility] calling to give vitals  Contact: [phone]     Vital Signs   4lb weight gain  BP 162/78

## 2018-06-04 NOTE — Telephone Encounter (Signed)
Spoke to patient's daughter Raven Gray she stated mother has gained 4 lbs.She has increased swelling in both lower legs.Sob no worse.She stated she gave mother 20 mg of lasix this morning with a klor con.Advised she can take lasix 20 mg daily for 3 days with a klor con daily for 3 days then return to normal dose as needed.Advised to call back if continues to have swelling and weight gain.

## 2018-06-07 ENCOUNTER — Telehealth: Payer: Self-pay | Admitting: Cardiovascular Disease

## 2018-06-07 NOTE — Telephone Encounter (Signed)
New message:       Pt's daughter states she spoke to rn on Friday about the pt's weight. She states they told her to call back if the pt's weight continues to go up.

## 2018-06-07 NOTE — Telephone Encounter (Signed)
Agree with appt tomorrow - needs to be examined if the increase in lasix over the weekend is not helping.  Dr. Lemmie Evens

## 2018-06-07 NOTE — Telephone Encounter (Signed)
offered appt for tomorrow 9/17 with APP-daughter request to try medication adjustments first prior to coming in for appt.

## 2018-06-07 NOTE — Telephone Encounter (Signed)
Spoke to daughter-aware of MD recommendations.  Per Dr. Debara Pickett ok to increase to 40 mg lasix daily but strongly recommends f/u OV.       Daughter agreed to come for appt tomorrow at 9 AM with Rosaria Ferries PA

## 2018-06-07 NOTE — Telephone Encounter (Signed)
Returned call to daughter (ok per DPR)-she states she spoke to nurse on Friday in regards to patient, was having swelling and SOB.   Was instructed to take 20 mg lasix and potassium x3 days and call back today if not resolved.   She states she has given medication since Friday. Weight on Friday 152 lbs, Sat 153 lbs, Sun 147 lbs, Mon 153 lbs.  She is still experiencing SOB which is making her anxious, she had to use her oxygen overnight when this is usually as needed.   Patient did eat [employer] chicken last night which is aware this could be contributing to weight fluctuation and SOB.  Patient is not having swelling.   Daughter is wondering what she needs to do with her medications.    Per med list, advised to increase to a full tablet 40 mg lasik today and would have MD review for further recommendations.  She does have [facility] nurse that will be coming to see patient on Wednesday but can come sooner if recommended.     Advised would call back with recommendations, daughter aware.   Routed to DOD to review

## 2018-06-08 ENCOUNTER — Other Ambulatory Visit: Payer: Self-pay

## 2018-06-08 ENCOUNTER — Encounter: Payer: Self-pay | Admitting: Physician Assistant

## 2018-06-08 ENCOUNTER — Ambulatory Visit: Payer: Medicare Other | Admitting: Physician Assistant

## 2018-06-08 VITALS — BP 187/78 | HR 68 | Ht 63.0 in | Wt 156.4 lb

## 2018-06-08 DIAGNOSIS — I482 Chronic atrial fibrillation, unspecified: Secondary | ICD-10-CM

## 2018-06-08 DIAGNOSIS — I495 Sick sinus syndrome: Secondary | ICD-10-CM | POA: Diagnosis not present

## 2018-06-08 DIAGNOSIS — I5033 Acute on chronic diastolic (congestive) heart failure: Secondary | ICD-10-CM | POA: Diagnosis not present

## 2018-06-08 DIAGNOSIS — K219 Gastro-esophageal reflux disease without esophagitis: Secondary | ICD-10-CM | POA: Diagnosis not present

## 2018-06-08 DIAGNOSIS — I5032 Chronic diastolic (congestive) heart failure: Secondary | ICD-10-CM

## 2018-06-08 DIAGNOSIS — I351 Nonrheumatic aortic (valve) insufficiency: Secondary | ICD-10-CM | POA: Diagnosis not present

## 2018-06-08 DIAGNOSIS — I951 Orthostatic hypotension: Secondary | ICD-10-CM | POA: Diagnosis not present

## 2018-06-08 DIAGNOSIS — N183 Chronic kidney disease, stage 3 (moderate): Secondary | ICD-10-CM | POA: Diagnosis not present

## 2018-06-08 DIAGNOSIS — J439 Emphysema, unspecified: Secondary | ICD-10-CM | POA: Diagnosis not present

## 2018-06-08 DIAGNOSIS — I10 Essential (primary) hypertension: Secondary | ICD-10-CM | POA: Diagnosis not present

## 2018-06-08 DIAGNOSIS — I130 Hypertensive heart and chronic kidney disease with heart failure and stage 1 through stage 4 chronic kidney disease, or unspecified chronic kidney disease: Secondary | ICD-10-CM | POA: Diagnosis not present

## 2018-06-08 DIAGNOSIS — N200 Calculus of kidney: Secondary | ICD-10-CM | POA: Diagnosis not present

## 2018-06-08 DIAGNOSIS — J9611 Chronic respiratory failure with hypoxia: Secondary | ICD-10-CM | POA: Diagnosis not present

## 2018-06-08 DIAGNOSIS — I700 Atherosclerosis of aorta: Secondary | ICD-10-CM | POA: Diagnosis not present

## 2018-06-08 MED ORDER — FUROSEMIDE 40 MG PO TABS: 40.0000 mg | ORAL_TABLET | Freq: Every day | ORAL | 1 refills | Status: DC | PRN

## 2018-06-08 MED ORDER — FUROSEMIDE 40 MG PO TABS: ORAL_TABLET | ORAL | 1 refills | Status: DC

## 2018-06-08 NOTE — Progress Notes (Signed)
Cardiology Office Note   Date:  06/08/2018   ID:  Raven Gray, DOB 01-29-1928, MRN [id number]  PCP:  Raven Morning, DO  Cardiologist: Dr. Gwenlyn Gray, 06/01/2018 PPM: Dr. Juanetta Snow, PA-C 03/01/2018  Chief Complaint  Patient presents with  . office visit    SOB, weight gain     History of Present Illness: [name] is a 84 y.o. female with a history of remote DVT/PEpreviouslyon Xarelto(stopped2/2019due to age and fall risk), HTN, HLD, COPD, FH CAD (father and daughter), Afib dx 2015 and now permanent, MV 2017 low risk, pauseson event monitor>>MDT PPM, AI.   Admitted 09/01-09/12/2017 for presyncope, HR 30s at times (falsely low per PPM interrogation). Hypotension felt 2nd hypovolemia. 9/10 office visit, patient with occasional chest pain but did not wish to be evaluated, anticoagulation DC'd secondary to fall risk volume status good with weight 151 pounds 9/13 phone notes regarding weight gain of 4 pounds and lower extremity edema, Lasix 20 mg daily for 3 days 9/16 phone notes, patient with shortness of breath, [employer] chicken for dinner,, Lasix 40 mg a day and appointment made  Raven Gray presents for cardiology follow up.   She has been gaining weight.   Stable wt at home is 147, she was 152 this am.   She has been on Lasix prn since d/c.  Her daughter restarted her on 20 mg per day x 4 days and then increased to 40 mg x 2 days after getting some advice.   She is using O2 all the time.  She dislikes the oxygen, says it sleeps a lot.  She describes orthopnea. She wakes up in the night coughing and SOB. +LE edema.   Her mood swings are getting worse.  According to her daughter, the patient has "cussed her out".   Past Medical History:  Diagnosis Date  . Atrial fibrillation ([place])    off AC due to falls  . Chest pain   . CHF (congestive heart failure) ([place])   . Cholecystitis 03/2018  . COPD (chronic obstructive pulmonary disease) (HCC)    wears prn home O2  .  Dementia   . H/O echocardiogram    a. 08/2016: EF of 60-65%, severely dilated LA, mild pulmonic regurgitations, mild TR, and PA Pressure at 38 mm Hg  . History of depression   . History of pulmonary embolism   . Hyperlipidemia   . Hypertension   . Hypokalemia    Now improved with treatment  . Pre-diabetes   . Shortness of breath   . Stroke [facility])    brainstem aneurysm(?) in 2016  . Tachy-brady syndrome ([place]) 04/01/2017   s/p MDT PPM     Past Surgical History:  Procedure Laterality Date  . ABDOMINAL HYSTERECTOMY    . BACK SURGERY    . CHOLECYSTECTOMY N/A 03/29/2018   Procedure: LAPAROSCOPIC CHOLECYSTECTOMY WITH INTRAOPERATIVE CHOLANGIOGRAM;  Surgeon: Raven Overall, MD;  Location: [facility];  Service: General;  Laterality: N/A;  . PACEMAKER IMPLANT N/A 04/01/2017   Procedure: Pacemaker Implant;  Surgeon: Raven Klein, MD;  Location: [place] CV LAB;  Service: Cardiovascular;  Laterality: N/A; MDT Azure XT SR MRI  . REPLACEMENT TOTAL KNEE    . ROTATOR CUFF REPAIR      Current Outpatient Medications  Medication Sig Dispense Refill  . acetaminophen (TYLENOL) 325 MG tablet Take 650 mg by mouth every 6 (six) hours as needed (for pain).     Marland Kitchen ALPRAZolam (XANAX) 0.25 MG tablet Take  1 tablet (0.25 mg total) by mouth 3 (three) times daily as needed for anxiety (anxiety). (Patient taking differently: Take 0.25 mg by mouth 3 (three) times daily as needed for anxiety. ) 5 tablet 0  . furosemide (LASIX) 40 MG tablet Take 1 tablet (40 mg total) by mouth daily as needed. Take 1 tablet (40 mg) two times a day for 3 days. Then 1 tablet by mouth daily. 30 tablet 0  . MAGNESIUM PO Take 1 tablet by mouth at bedtime.     Marland Kitchen MELATONIN PO Take 1 tablet by mouth at bedtime as needed (sleep).     . metoprolol tartrate (LOPRESSOR) 25 MG tablet Take 1 tablet (25 mg total) by mouth daily. 90 tablet 3  . montelukast (SINGULAIR) 10 MG tablet Take 10 mg by mouth at bedtime.    . Multiple Vitamin (MULTIVITAMIN WITH  MINERALS) TABS tablet Take 1 tablet by mouth daily.    . nitroGLYCERIN (NITROSTAT) 0.4 MG SL tablet Place 0.4 mg under the tongue every 5 (five) minutes as needed for chest pain. X 3 doses  0  . omeprazole (PRILOSEC) 20 MG capsule Take 20 mg by mouth daily as needed (for reflux/heartburn).   11  . ondansetron (ZOFRAN ODT) 4 MG disintegrating tablet Take 1 tablet (4 mg total) by mouth every 8 (eight) hours as needed for nausea. 10 tablet 0  . OXYGEN Inhale 2 L into the lungs daily as needed (shortness of breath).     . polyethylene glycol powder (GLYCOLAX/MIRALAX) powder Take 17 g by mouth 2 (two) times daily. Until daily soft stools OTC 500 g 0  . potassium chloride SA (K-DUR,KLOR-CON) 20 MEQ tablet Take 1 tablet (20 mEq total) by mouth daily as needed (along with lasix.). (Patient taking differently: Take 20 mEq by mouth daily. ) 30 tablet 0  . pravastatin (PRAVACHOL) 40 MG tablet Take 40 mg by mouth daily.     . traZODone (DESYREL) 50 MG tablet Take 50 mg by mouth at bedtime.   1  . venlafaxine XR (EFFEXOR-XR) 150 MG 24 hr capsule Take 150 mg by mouth See admin instructions. Take one capsule (150 mg) by mouth with a 75 mg capsule for a total dose of 225 mg daily    . venlafaxine XR (EFFEXOR-XR) 75 MG 24 hr capsule Take 75 mg by mouth See admin instructions. Take one capsule (75 mg) by mouth with a 150 mg capsule for a total dose of 225 mg daily     No current facility-administered medications for this visit.     Allergies:   Patient has no known allergies.    Social History:  The patient  reports that she has never smoked. She has never used smokeless tobacco. She reports that she does not drink alcohol or use drugs.   Family History:  The patient's family history includes Alzheimer's disease in her mother; Diabetes type II in her daughter; Heart attack in her father; Heart disease in her daughter.    ROS:  Please see the history of present illness. All other systems are reviewed and  negative.    PHYSICAL EXAM: VS:  BP (!) 187/78 (BP Location: Left Arm, Patient Position: Sitting)   Pulse 68   Ht 5\' 3"  (1.6 m)   Wt 156 lb 6.4 oz (70.9 kg)   SpO2 100% Comment: 2L of o2  BMI 27.71 kg/m  , BMI Body mass index is 27.71 kg/m. GEN: Well nourished, well developed, female in no acute distress  HEENT: normal for age  Neck: JVD 9-10 cm, no carotid bruit, no masses Cardiac: Irregular rate and rhythm; 2-3/7 diastolic murmur, no rubs, or gallops Respiratory: Some rales bases bilaterally, normal work of breathing GI: soft, nontender, nondistended, + BS MS: no deformity or atrophy; trace-1+ lower extremity edema; distal pulses are 2+ in all 4 extremities   Skin: warm and dry, no rash Neuro:  Strength and sensation are intact Psych: euthymic mood, full affect   EKG:  EKG is not ordered today.   ECHO: 11/07/2017 - Left ventricle: The cavity size was normal. There was severe focal basal hypertrophy of the septum with otherwise moderate concentric hypertrophy. Systolic function was normal. The estimated ejection fraction was in the range of 50% to 55%. Mild hypokinesis of the anterior and anterolateral myocardium. - Aortic valve: Transvalvular velocity was within the normal range. There was no stenosis. There was moderate regurgitation. Valve area (Vmax): 1.59 cm^2. - Mitral valve: Transvalvular velocity was within the normal range. There was no evidence for stenosis. There was mild regurgitation. - Left atrium: The atrium was severely dilated. - Right ventricle: The cavity size was normal. Wall thickness was normal. Systolic function was normal. - Right atrium: The atrium was moderately dilated. - Tricuspid valve: There was moderate regurgitation. - Pulmonary arteries: Systolic pressure was mildly increased. PA peak pressure: 39 mm Hg (S).   Recent Labs: 03/29/2018: B Natriuretic Peptide 811.5 05/26/2018: ALT 12; BUN 9; Creatinine, Ser 0.92; Hemoglobin  14.4; Magnesium 1.8; Platelets 169; Potassium 4.0; Sodium 142    Lipid Panel    Component Value Date/Time   CHOL 115 01/09/2017 0356   TRIG 86 01/09/2017 0356   HDL 40 (L) 01/09/2017 0356   CHOLHDL 2.9 01/09/2017 0356   VLDL 17 01/09/2017 0356   LDLCALC 58 01/09/2017 0356     Wt Readings from Last 3 Encounters:  06/08/18 156 lb 6.4 oz (70.9 kg)  06/01/18 151 lb 9.6 oz (68.8 kg)  05/26/18 147 lb 7.8 oz (66.9 kg)     Other studies Reviewed: Additional studies/ records that were reviewed today include: Office notes, hospital records and testing.  ASSESSMENT AND PLAN:  1.  Acute on chronic diastolic CHF: Her weight is up 9 pounds, and she describes classic CHF symptoms. - During her recent hospitalization, she was orthostatic, hypotensive and her diuretics were held - I do not believe PRN diuretics will be enough to manage her volume. -To pull fluid off, increase Lasix to 40 mg twice daily for 3 days - Then, she is to take Lasix 20 mg daily, unless her weight is at her dry weight of 147 pounds or less. -Hopefully, this will help to keep extra fluid off, but also prevent overdiuresis -Renal function was good approximately 2 weeks ago, will not recheck today.  However, she will need a recheck in the next few weeks.  2.  Permanent atrial fibrillation: Heart rate is controlled on current therapy, no changes  3.  Hypertension: Her blood pressure is elevated today.  However, I believe there will be some improvement with diuresis.  Additionally, it is stressful for her to leave the house.  According to her daughter, it is getting harder and harder to get her anywhere and she becomes agitated.  This may also contribute to an elevated blood pressure. - No medication changes for now, follow-up on her blood pressure and adjust medications in the future as needed.  4.  History of [employer] pacemaker: Keep appointments for pacer checks  Current  medicines are reviewed at length with the patient  today.  The patient has concerns regarding medicines.  Concerns were addressed  The following changes have been made: Restart Lasix with parameters  Labs/ tests ordered today include:  No orders of the defined types were placed in this encounter.    Disposition:   FU with Dr. Gwenlyn Gray  Signed, Rosaria Ferries, PA-C  06/08/2018 1:50 PM    [place] Phone: [phone]; Fax: [phone]  This note was written with the assistance of speech recognition software. Please excuse any transcriptional errors.

## 2018-06-08 NOTE — Patient Instructions (Signed)
Medication Instructions:  Start Lasix 40 mg twice a day for THREE days 6 hours apart.  After you take the lasix 40 mg for THREE days, start Lasix 20 mg daily, HOLD it if her dry weight is 147 lb.  If you need a refill on your cardiac medications before your next appointment, please call your pharmacy.  Labwork: None Ordered.  Testing/Procedures: None Ordered.  Special Instructions: Continue daily weights. Limit sodium to 500 mg per meal and 2,000 mg a day. Limit fluid intake to 1 quart, no more than 2 quarts a day.   Follow-Up: Your physician wants you to keep follow up appointment with Dr.Berry in December.     Thank you for choosing [facility]!!

## 2018-06-09 DIAGNOSIS — N183 Chronic kidney disease, stage 3 (moderate): Secondary | ICD-10-CM | POA: Diagnosis not present

## 2018-06-09 DIAGNOSIS — J439 Emphysema, unspecified: Secondary | ICD-10-CM | POA: Diagnosis not present

## 2018-06-09 DIAGNOSIS — I700 Atherosclerosis of aorta: Secondary | ICD-10-CM | POA: Diagnosis not present

## 2018-06-09 DIAGNOSIS — K219 Gastro-esophageal reflux disease without esophagitis: Secondary | ICD-10-CM | POA: Diagnosis not present

## 2018-06-09 DIAGNOSIS — I482 Chronic atrial fibrillation: Secondary | ICD-10-CM | POA: Diagnosis not present

## 2018-06-09 DIAGNOSIS — I130 Hypertensive heart and chronic kidney disease with heart failure and stage 1 through stage 4 chronic kidney disease, or unspecified chronic kidney disease: Secondary | ICD-10-CM | POA: Diagnosis not present

## 2018-06-09 DIAGNOSIS — J9611 Chronic respiratory failure with hypoxia: Secondary | ICD-10-CM | POA: Diagnosis not present

## 2018-06-09 DIAGNOSIS — I495 Sick sinus syndrome: Secondary | ICD-10-CM | POA: Diagnosis not present

## 2018-06-09 DIAGNOSIS — I351 Nonrheumatic aortic (valve) insufficiency: Secondary | ICD-10-CM | POA: Diagnosis not present

## 2018-06-09 DIAGNOSIS — N200 Calculus of kidney: Secondary | ICD-10-CM | POA: Diagnosis not present

## 2018-06-09 DIAGNOSIS — I951 Orthostatic hypotension: Secondary | ICD-10-CM | POA: Diagnosis not present

## 2018-06-09 DIAGNOSIS — I5032 Chronic diastolic (congestive) heart failure: Secondary | ICD-10-CM | POA: Diagnosis not present

## 2018-06-11 DIAGNOSIS — I5032 Chronic diastolic (congestive) heart failure: Secondary | ICD-10-CM | POA: Diagnosis not present

## 2018-06-11 DIAGNOSIS — N200 Calculus of kidney: Secondary | ICD-10-CM | POA: Diagnosis not present

## 2018-06-11 DIAGNOSIS — N183 Chronic kidney disease, stage 3 (moderate): Secondary | ICD-10-CM | POA: Diagnosis not present

## 2018-06-11 DIAGNOSIS — J439 Emphysema, unspecified: Secondary | ICD-10-CM | POA: Diagnosis not present

## 2018-06-11 DIAGNOSIS — I495 Sick sinus syndrome: Secondary | ICD-10-CM | POA: Diagnosis not present

## 2018-06-11 DIAGNOSIS — J9611 Chronic respiratory failure with hypoxia: Secondary | ICD-10-CM | POA: Diagnosis not present

## 2018-06-11 DIAGNOSIS — I951 Orthostatic hypotension: Secondary | ICD-10-CM | POA: Diagnosis not present

## 2018-06-11 DIAGNOSIS — K219 Gastro-esophageal reflux disease without esophagitis: Secondary | ICD-10-CM | POA: Diagnosis not present

## 2018-06-11 DIAGNOSIS — I351 Nonrheumatic aortic (valve) insufficiency: Secondary | ICD-10-CM | POA: Diagnosis not present

## 2018-06-11 DIAGNOSIS — I482 Chronic atrial fibrillation: Secondary | ICD-10-CM | POA: Diagnosis not present

## 2018-06-11 DIAGNOSIS — I130 Hypertensive heart and chronic kidney disease with heart failure and stage 1 through stage 4 chronic kidney disease, or unspecified chronic kidney disease: Secondary | ICD-10-CM | POA: Diagnosis not present

## 2018-06-11 DIAGNOSIS — I700 Atherosclerosis of aorta: Secondary | ICD-10-CM | POA: Diagnosis not present

## 2018-06-15 DIAGNOSIS — N200 Calculus of kidney: Secondary | ICD-10-CM | POA: Diagnosis not present

## 2018-06-15 DIAGNOSIS — I5032 Chronic diastolic (congestive) heart failure: Secondary | ICD-10-CM | POA: Diagnosis not present

## 2018-06-15 DIAGNOSIS — I351 Nonrheumatic aortic (valve) insufficiency: Secondary | ICD-10-CM | POA: Diagnosis not present

## 2018-06-15 DIAGNOSIS — K219 Gastro-esophageal reflux disease without esophagitis: Secondary | ICD-10-CM | POA: Diagnosis not present

## 2018-06-15 DIAGNOSIS — N183 Chronic kidney disease, stage 3 (moderate): Secondary | ICD-10-CM | POA: Diagnosis not present

## 2018-06-15 DIAGNOSIS — I700 Atherosclerosis of aorta: Secondary | ICD-10-CM | POA: Diagnosis not present

## 2018-06-15 DIAGNOSIS — I482 Chronic atrial fibrillation: Secondary | ICD-10-CM | POA: Diagnosis not present

## 2018-06-15 DIAGNOSIS — J439 Emphysema, unspecified: Secondary | ICD-10-CM | POA: Diagnosis not present

## 2018-06-15 DIAGNOSIS — I495 Sick sinus syndrome: Secondary | ICD-10-CM | POA: Diagnosis not present

## 2018-06-15 DIAGNOSIS — J9611 Chronic respiratory failure with hypoxia: Secondary | ICD-10-CM | POA: Diagnosis not present

## 2018-06-15 DIAGNOSIS — I130 Hypertensive heart and chronic kidney disease with heart failure and stage 1 through stage 4 chronic kidney disease, or unspecified chronic kidney disease: Secondary | ICD-10-CM | POA: Diagnosis not present

## 2018-06-15 DIAGNOSIS — I951 Orthostatic hypotension: Secondary | ICD-10-CM | POA: Diagnosis not present

## 2018-06-16 DIAGNOSIS — I482 Chronic atrial fibrillation: Secondary | ICD-10-CM | POA: Diagnosis not present

## 2018-06-16 DIAGNOSIS — J439 Emphysema, unspecified: Secondary | ICD-10-CM | POA: Diagnosis not present

## 2018-06-16 DIAGNOSIS — N200 Calculus of kidney: Secondary | ICD-10-CM | POA: Diagnosis not present

## 2018-06-16 DIAGNOSIS — I351 Nonrheumatic aortic (valve) insufficiency: Secondary | ICD-10-CM | POA: Diagnosis not present

## 2018-06-16 DIAGNOSIS — I130 Hypertensive heart and chronic kidney disease with heart failure and stage 1 through stage 4 chronic kidney disease, or unspecified chronic kidney disease: Secondary | ICD-10-CM | POA: Diagnosis not present

## 2018-06-16 DIAGNOSIS — I951 Orthostatic hypotension: Secondary | ICD-10-CM | POA: Diagnosis not present

## 2018-06-16 DIAGNOSIS — I700 Atherosclerosis of aorta: Secondary | ICD-10-CM | POA: Diagnosis not present

## 2018-06-16 DIAGNOSIS — N183 Chronic kidney disease, stage 3 (moderate): Secondary | ICD-10-CM | POA: Diagnosis not present

## 2018-06-16 DIAGNOSIS — K219 Gastro-esophageal reflux disease without esophagitis: Secondary | ICD-10-CM | POA: Diagnosis not present

## 2018-06-16 DIAGNOSIS — I5032 Chronic diastolic (congestive) heart failure: Secondary | ICD-10-CM | POA: Diagnosis not present

## 2018-06-16 DIAGNOSIS — I495 Sick sinus syndrome: Secondary | ICD-10-CM | POA: Diagnosis not present

## 2018-06-16 DIAGNOSIS — J9611 Chronic respiratory failure with hypoxia: Secondary | ICD-10-CM | POA: Diagnosis not present

## 2018-06-17 ENCOUNTER — Telehealth: Payer: Self-pay | Admitting: Physician Assistant

## 2018-06-17 NOTE — Telephone Encounter (Signed)
Pt c/o medication issue: 1. Name of Medication: furosemide (LASIX) 40 MG tablet  2. How are you currently taking this medication (dosage and times per day)? 20 MG 1 X DAY  3. Are you having a reaction (difficulty breathing--STAT)?  More confused  4. What is your medication issue? Does she continue to give her that every day or does she stop the medication

## 2018-06-17 NOTE — Telephone Encounter (Signed)
Spoke to [place] ( Barrister's clerk) .  She states patient dry weights is now at 150 lbs. Since patient appetite is better.    Question is to keep direction of taking 20 mg daily and holding if weight is below 147 lbs. Hospice RN STATES SHE INFORMED PATIENT FAMILY TO CONTINUE  20 MG IF WEIGHT IS ABOVE 147. Per Olivia Mackie, patient having little confusion -  "talking to her dead husband and stating she did activity that she is unable to do( clean up kitchen). She also states patient fell and hit her head yesterday after being weighed,little knot she did not go and have an evalutation.  No changes.Family stated they would weigh her differently.   Aware will defer to [facility].

## 2018-06-18 ENCOUNTER — Telehealth: Payer: Self-pay | Admitting: Cardiovascular Disease

## 2018-06-18 DIAGNOSIS — J9611 Chronic respiratory failure with hypoxia: Secondary | ICD-10-CM | POA: Diagnosis not present

## 2018-06-18 DIAGNOSIS — I951 Orthostatic hypotension: Secondary | ICD-10-CM | POA: Diagnosis not present

## 2018-06-18 DIAGNOSIS — I5032 Chronic diastolic (congestive) heart failure: Secondary | ICD-10-CM | POA: Diagnosis not present

## 2018-06-18 DIAGNOSIS — K219 Gastro-esophageal reflux disease without esophagitis: Secondary | ICD-10-CM | POA: Diagnosis not present

## 2018-06-18 DIAGNOSIS — I130 Hypertensive heart and chronic kidney disease with heart failure and stage 1 through stage 4 chronic kidney disease, or unspecified chronic kidney disease: Secondary | ICD-10-CM | POA: Diagnosis not present

## 2018-06-18 DIAGNOSIS — I482 Chronic atrial fibrillation: Secondary | ICD-10-CM | POA: Diagnosis not present

## 2018-06-18 DIAGNOSIS — I495 Sick sinus syndrome: Secondary | ICD-10-CM | POA: Diagnosis not present

## 2018-06-18 DIAGNOSIS — N183 Chronic kidney disease, stage 3 (moderate): Secondary | ICD-10-CM | POA: Diagnosis not present

## 2018-06-18 DIAGNOSIS — I700 Atherosclerosis of aorta: Secondary | ICD-10-CM | POA: Diagnosis not present

## 2018-06-18 DIAGNOSIS — I351 Nonrheumatic aortic (valve) insufficiency: Secondary | ICD-10-CM | POA: Diagnosis not present

## 2018-06-18 DIAGNOSIS — N200 Calculus of kidney: Secondary | ICD-10-CM | POA: Diagnosis not present

## 2018-06-18 DIAGNOSIS — J439 Emphysema, unspecified: Secondary | ICD-10-CM | POA: Diagnosis not present

## 2018-06-18 NOTE — Telephone Encounter (Signed)
New Message:    Linus Orn with [facility] is requesting you call the patient if there is any medication change

## 2018-06-18 NOTE — Telephone Encounter (Signed)
We need a BMET, set of vital signs including O2 saturation, can the home health nurse get that with her next visit and communicated to [place]? If her dry weight is now 150 pounds, she can hold the Lasix for weight of 150 pounds or less. It is a tough balancing act, but I want to make sure that she is not confused because she is hypoxic. I also do not want to ruin her kidneys giving her too much. Thanks

## 2018-06-18 NOTE — Telephone Encounter (Signed)
Just an fyi if any med changes for patient. Dr.Berry patient.

## 2018-06-18 NOTE — Telephone Encounter (Signed)
Called [facility] hospice nurse, LMTCB, left call back number.

## 2018-06-21 DIAGNOSIS — J9611 Chronic respiratory failure with hypoxia: Secondary | ICD-10-CM | POA: Diagnosis not present

## 2018-06-21 DIAGNOSIS — Z993 Dependence on wheelchair: Secondary | ICD-10-CM | POA: Diagnosis not present

## 2018-06-21 DIAGNOSIS — Z9181 History of falling: Secondary | ICD-10-CM | POA: Diagnosis not present

## 2018-06-21 DIAGNOSIS — N200 Calculus of kidney: Secondary | ICD-10-CM | POA: Diagnosis not present

## 2018-06-21 DIAGNOSIS — E785 Hyperlipidemia, unspecified: Secondary | ICD-10-CM | POA: Diagnosis not present

## 2018-06-21 DIAGNOSIS — I495 Sick sinus syndrome: Secondary | ICD-10-CM | POA: Diagnosis not present

## 2018-06-21 DIAGNOSIS — I700 Atherosclerosis of aorta: Secondary | ICD-10-CM | POA: Diagnosis not present

## 2018-06-21 DIAGNOSIS — I951 Orthostatic hypotension: Secondary | ICD-10-CM | POA: Diagnosis not present

## 2018-06-21 DIAGNOSIS — K219 Gastro-esophageal reflux disease without esophagitis: Secondary | ICD-10-CM | POA: Diagnosis not present

## 2018-06-21 DIAGNOSIS — N183 Chronic kidney disease, stage 3 (moderate): Secondary | ICD-10-CM | POA: Diagnosis not present

## 2018-06-21 DIAGNOSIS — Z79891 Long term (current) use of opiate analgesic: Secondary | ICD-10-CM | POA: Diagnosis not present

## 2018-06-21 DIAGNOSIS — I5032 Chronic diastolic (congestive) heart failure: Secondary | ICD-10-CM | POA: Diagnosis not present

## 2018-06-21 DIAGNOSIS — I351 Nonrheumatic aortic (valve) insufficiency: Secondary | ICD-10-CM | POA: Diagnosis not present

## 2018-06-21 DIAGNOSIS — M1712 Unilateral primary osteoarthritis, left knee: Secondary | ICD-10-CM | POA: Diagnosis not present

## 2018-06-21 DIAGNOSIS — I4821 Permanent atrial fibrillation: Secondary | ICD-10-CM | POA: Diagnosis not present

## 2018-06-21 DIAGNOSIS — J439 Emphysema, unspecified: Secondary | ICD-10-CM | POA: Diagnosis not present

## 2018-06-21 DIAGNOSIS — R7303 Prediabetes: Secondary | ICD-10-CM | POA: Diagnosis not present

## 2018-06-21 DIAGNOSIS — Z950 Presence of cardiac pacemaker: Secondary | ICD-10-CM | POA: Diagnosis not present

## 2018-06-21 DIAGNOSIS — Z8673 Personal history of transient ischemic attack (TIA), and cerebral infarction without residual deficits: Secondary | ICD-10-CM | POA: Diagnosis not present

## 2018-06-21 DIAGNOSIS — I130 Hypertensive heart and chronic kidney disease with heart failure and stage 1 through stage 4 chronic kidney disease, or unspecified chronic kidney disease: Secondary | ICD-10-CM | POA: Diagnosis not present

## 2018-06-21 NOTE — Telephone Encounter (Signed)
Spoke with pt dtr and the speech therapist, orders given for bmp. Vitals today were bp=181/70,prior to meds, pulse=59 and weight yesterday was 152 lb. They are going to have the nurse call back for the lab order. Will forward to rhonda barrett pa for review.

## 2018-06-23 ENCOUNTER — Other Ambulatory Visit: Payer: Self-pay

## 2018-06-23 ENCOUNTER — Encounter: Payer: Self-pay | Admitting: Cardiovascular Disease

## 2018-06-23 DIAGNOSIS — Z993 Dependence on wheelchair: Secondary | ICD-10-CM | POA: Diagnosis not present

## 2018-06-23 DIAGNOSIS — I951 Orthostatic hypotension: Secondary | ICD-10-CM | POA: Diagnosis not present

## 2018-06-23 DIAGNOSIS — N183 Chronic kidney disease, stage 3 (moderate): Secondary | ICD-10-CM | POA: Diagnosis not present

## 2018-06-23 DIAGNOSIS — Z8673 Personal history of transient ischemic attack (TIA), and cerebral infarction without residual deficits: Secondary | ICD-10-CM | POA: Diagnosis not present

## 2018-06-23 DIAGNOSIS — J9611 Chronic respiratory failure with hypoxia: Secondary | ICD-10-CM | POA: Diagnosis not present

## 2018-06-23 DIAGNOSIS — J439 Emphysema, unspecified: Secondary | ICD-10-CM | POA: Diagnosis not present

## 2018-06-23 DIAGNOSIS — I351 Nonrheumatic aortic (valve) insufficiency: Secondary | ICD-10-CM | POA: Diagnosis not present

## 2018-06-23 DIAGNOSIS — J410 Simple chronic bronchitis: Secondary | ICD-10-CM | POA: Diagnosis not present

## 2018-06-23 DIAGNOSIS — I5032 Chronic diastolic (congestive) heart failure: Secondary | ICD-10-CM | POA: Diagnosis not present

## 2018-06-23 DIAGNOSIS — N200 Calculus of kidney: Secondary | ICD-10-CM | POA: Diagnosis not present

## 2018-06-23 DIAGNOSIS — E785 Hyperlipidemia, unspecified: Secondary | ICD-10-CM | POA: Diagnosis not present

## 2018-06-23 DIAGNOSIS — I130 Hypertensive heart and chronic kidney disease with heart failure and stage 1 through stage 4 chronic kidney disease, or unspecified chronic kidney disease: Secondary | ICD-10-CM | POA: Diagnosis not present

## 2018-06-23 DIAGNOSIS — M1712 Unilateral primary osteoarthritis, left knee: Secondary | ICD-10-CM | POA: Diagnosis not present

## 2018-06-23 DIAGNOSIS — Z9181 History of falling: Secondary | ICD-10-CM | POA: Diagnosis not present

## 2018-06-23 DIAGNOSIS — Z7689 Persons encountering health services in other specified circumstances: Secondary | ICD-10-CM | POA: Diagnosis not present

## 2018-06-23 DIAGNOSIS — I4821 Permanent atrial fibrillation: Secondary | ICD-10-CM | POA: Diagnosis not present

## 2018-06-23 DIAGNOSIS — I495 Sick sinus syndrome: Secondary | ICD-10-CM | POA: Diagnosis not present

## 2018-06-23 DIAGNOSIS — K219 Gastro-esophageal reflux disease without esophagitis: Secondary | ICD-10-CM | POA: Diagnosis not present

## 2018-06-23 DIAGNOSIS — I700 Atherosclerosis of aorta: Secondary | ICD-10-CM | POA: Diagnosis not present

## 2018-06-23 DIAGNOSIS — Z950 Presence of cardiac pacemaker: Secondary | ICD-10-CM | POA: Diagnosis not present

## 2018-06-23 DIAGNOSIS — Z79891 Long term (current) use of opiate analgesic: Secondary | ICD-10-CM | POA: Diagnosis not present

## 2018-06-23 DIAGNOSIS — R7303 Prediabetes: Secondary | ICD-10-CM | POA: Diagnosis not present

## 2018-06-23 NOTE — Telephone Encounter (Signed)
Follow Up:     Please call, she said you had talked to the PT on Monday, concerning the pt.

## 2018-06-23 NOTE — Telephone Encounter (Signed)
Spoke with patients daughter and she stated a nurse was going to draw her moms labs at home and she was going to contact our office for the order.  I told the daughter what lab we needed and told she could call me when the nurse gets there and I can send her my lab order. She voiced understanding.

## 2018-06-23 NOTE — Telephone Encounter (Signed)
Spoke with nurse gave her a verbal for patient to have a bmet per [employer]. She voiced understanding and stated she would fax a copy of lab report once labs are completed.

## 2018-06-24 ENCOUNTER — Telehealth: Payer: Self-pay

## 2018-06-24 ENCOUNTER — Telehealth: Payer: Self-pay | Admitting: Cardiology

## 2018-06-24 ENCOUNTER — Telehealth: Payer: Self-pay | Admitting: Physician Assistant

## 2018-06-24 DIAGNOSIS — I130 Hypertensive heart and chronic kidney disease with heart failure and stage 1 through stage 4 chronic kidney disease, or unspecified chronic kidney disease: Secondary | ICD-10-CM | POA: Diagnosis not present

## 2018-06-24 DIAGNOSIS — J439 Emphysema, unspecified: Secondary | ICD-10-CM | POA: Diagnosis not present

## 2018-06-24 DIAGNOSIS — I351 Nonrheumatic aortic (valve) insufficiency: Secondary | ICD-10-CM | POA: Diagnosis not present

## 2018-06-24 DIAGNOSIS — I5032 Chronic diastolic (congestive) heart failure: Secondary | ICD-10-CM | POA: Diagnosis not present

## 2018-06-24 DIAGNOSIS — Z8673 Personal history of transient ischemic attack (TIA), and cerebral infarction without residual deficits: Secondary | ICD-10-CM | POA: Diagnosis not present

## 2018-06-24 DIAGNOSIS — I700 Atherosclerosis of aorta: Secondary | ICD-10-CM | POA: Diagnosis not present

## 2018-06-24 DIAGNOSIS — Z993 Dependence on wheelchair: Secondary | ICD-10-CM | POA: Diagnosis not present

## 2018-06-24 DIAGNOSIS — N183 Chronic kidney disease, stage 3 (moderate): Secondary | ICD-10-CM | POA: Diagnosis not present

## 2018-06-24 DIAGNOSIS — I4821 Permanent atrial fibrillation: Secondary | ICD-10-CM | POA: Diagnosis not present

## 2018-06-24 DIAGNOSIS — E785 Hyperlipidemia, unspecified: Secondary | ICD-10-CM | POA: Diagnosis not present

## 2018-06-24 DIAGNOSIS — N200 Calculus of kidney: Secondary | ICD-10-CM | POA: Diagnosis not present

## 2018-06-24 DIAGNOSIS — I495 Sick sinus syndrome: Secondary | ICD-10-CM | POA: Diagnosis not present

## 2018-06-24 DIAGNOSIS — Z9181 History of falling: Secondary | ICD-10-CM | POA: Diagnosis not present

## 2018-06-24 DIAGNOSIS — Z950 Presence of cardiac pacemaker: Secondary | ICD-10-CM | POA: Diagnosis not present

## 2018-06-24 DIAGNOSIS — I951 Orthostatic hypotension: Secondary | ICD-10-CM | POA: Diagnosis not present

## 2018-06-24 DIAGNOSIS — K219 Gastro-esophageal reflux disease without esophagitis: Secondary | ICD-10-CM | POA: Diagnosis not present

## 2018-06-24 DIAGNOSIS — Z79891 Long term (current) use of opiate analgesic: Secondary | ICD-10-CM | POA: Diagnosis not present

## 2018-06-24 DIAGNOSIS — R7303 Prediabetes: Secondary | ICD-10-CM | POA: Diagnosis not present

## 2018-06-24 DIAGNOSIS — M1712 Unilateral primary osteoarthritis, left knee: Secondary | ICD-10-CM | POA: Diagnosis not present

## 2018-06-24 DIAGNOSIS — J9611 Chronic respiratory failure with hypoxia: Secondary | ICD-10-CM | POA: Diagnosis not present

## 2018-06-24 NOTE — Telephone Encounter (Signed)
Spoke DOD Dr.Christopher about patient's 6 lb weight gain over night.She agreed ok to increase lasix to 40 mg twice a day for 3 days then return to normal dose. Daughter stated mother had bmet done yesterday with home health.

## 2018-06-24 NOTE — Telephone Encounter (Signed)
Returned call to patient's daughter [name] she stated mother has gained 6 lbs over night.Stated her weight yesterday 146 lbs today 152 lbs.Stated she has a dry cough.Swelling in legs look no worse.B/P has been elevated 166/78,157/97,150/78,178/98 pulse 70's.Stated she is following Rhonda's advice 500 mg of sodium per meal,no more than 2 liters fluid per day.Stated last month she took lasix 40 mg twice a day for 3 days then 20 mg daily if weight greater than 147 lbs.Stated last week she had to take 20 mg daily except none on Sat.Advised increase lasix to 40 mg twice a day for 3 days only then return to normal dose.Advised I will send message to Rosaria Ferries PA for advice about elevated B/P and weight gain.

## 2018-06-24 NOTE — Telephone Encounter (Signed)
Wrong provider

## 2018-06-24 NOTE — Telephone Encounter (Signed)
Pt c/o swelling: STAT is pt has developed SOB within 24 hours  1) How much weight have you gained and in what time span? Yesterday she 146 lbs today she weighs 152 lbs  2) If swelling, where is the swelling located? Feet and legs- no more than usual  3) Are you currently taking a fluid pill? Furosemide  4) Are you currently SOB? Yes, but short of breath most of the time, coughing a lot this time  5) Do you have a log of your daily weights (if so, list)? yes  6) Have you gained 3 pounds in a day or 5 pounds in a week? Yes 6 lbs in one day  7) Have you traveled recently? no

## 2018-06-25 DIAGNOSIS — I5032 Chronic diastolic (congestive) heart failure: Secondary | ICD-10-CM | POA: Diagnosis not present

## 2018-06-25 DIAGNOSIS — J439 Emphysema, unspecified: Secondary | ICD-10-CM | POA: Diagnosis not present

## 2018-06-25 DIAGNOSIS — I700 Atherosclerosis of aorta: Secondary | ICD-10-CM | POA: Diagnosis not present

## 2018-06-25 DIAGNOSIS — E785 Hyperlipidemia, unspecified: Secondary | ICD-10-CM | POA: Diagnosis not present

## 2018-06-25 DIAGNOSIS — I4821 Permanent atrial fibrillation: Secondary | ICD-10-CM | POA: Diagnosis not present

## 2018-06-25 DIAGNOSIS — N183 Chronic kidney disease, stage 3 (moderate): Secondary | ICD-10-CM | POA: Diagnosis not present

## 2018-06-25 DIAGNOSIS — I495 Sick sinus syndrome: Secondary | ICD-10-CM | POA: Diagnosis not present

## 2018-06-25 DIAGNOSIS — N200 Calculus of kidney: Secondary | ICD-10-CM | POA: Diagnosis not present

## 2018-06-25 DIAGNOSIS — I130 Hypertensive heart and chronic kidney disease with heart failure and stage 1 through stage 4 chronic kidney disease, or unspecified chronic kidney disease: Secondary | ICD-10-CM | POA: Diagnosis not present

## 2018-06-25 DIAGNOSIS — M1712 Unilateral primary osteoarthritis, left knee: Secondary | ICD-10-CM | POA: Diagnosis not present

## 2018-06-25 DIAGNOSIS — Z9181 History of falling: Secondary | ICD-10-CM | POA: Diagnosis not present

## 2018-06-25 DIAGNOSIS — I951 Orthostatic hypotension: Secondary | ICD-10-CM | POA: Diagnosis not present

## 2018-06-25 DIAGNOSIS — R7303 Prediabetes: Secondary | ICD-10-CM | POA: Diagnosis not present

## 2018-06-25 DIAGNOSIS — Z950 Presence of cardiac pacemaker: Secondary | ICD-10-CM | POA: Diagnosis not present

## 2018-06-25 DIAGNOSIS — Z79891 Long term (current) use of opiate analgesic: Secondary | ICD-10-CM | POA: Diagnosis not present

## 2018-06-25 DIAGNOSIS — I351 Nonrheumatic aortic (valve) insufficiency: Secondary | ICD-10-CM | POA: Diagnosis not present

## 2018-06-25 DIAGNOSIS — K219 Gastro-esophageal reflux disease without esophagitis: Secondary | ICD-10-CM | POA: Diagnosis not present

## 2018-06-25 DIAGNOSIS — Z8673 Personal history of transient ischemic attack (TIA), and cerebral infarction without residual deficits: Secondary | ICD-10-CM | POA: Diagnosis not present

## 2018-06-25 DIAGNOSIS — J9611 Chronic respiratory failure with hypoxia: Secondary | ICD-10-CM | POA: Diagnosis not present

## 2018-06-25 DIAGNOSIS — Z993 Dependence on wheelchair: Secondary | ICD-10-CM | POA: Diagnosis not present

## 2018-06-25 NOTE — Telephone Encounter (Signed)
Received from patient's daughter [name] OVZCHY'I advice given.Advised to monitor B/P and call back sooner if needed.

## 2018-06-25 NOTE — Telephone Encounter (Signed)
She should take an extra Lasix tablet today. Then, continue with the previous plan Even with diet and fluid compliance, she can put on fluid occasionally. The goal is to make that happen as little as possible and treated quickly as she is wanting to do. Her blood pressure may be up in response to stress or the extra fluid.  I do not want to make any long-term med changes right now. Keep an eye on the blood pressure when her fluid is normal.  It is safer to have her blood pressure run a little high, than have it go too low. If her volume/weight stabilizes and her blood pressure still too high, I will look at her meds again to see what we might be able to add. Thanks

## 2018-06-25 NOTE — Telephone Encounter (Signed)
Returned call to patient's daughter [name] no answer.[place].

## 2018-06-28 DIAGNOSIS — N183 Chronic kidney disease, stage 3 (moderate): Secondary | ICD-10-CM | POA: Diagnosis not present

## 2018-06-28 DIAGNOSIS — Z9181 History of falling: Secondary | ICD-10-CM | POA: Diagnosis not present

## 2018-06-28 DIAGNOSIS — I130 Hypertensive heart and chronic kidney disease with heart failure and stage 1 through stage 4 chronic kidney disease, or unspecified chronic kidney disease: Secondary | ICD-10-CM | POA: Diagnosis not present

## 2018-06-28 DIAGNOSIS — Z8673 Personal history of transient ischemic attack (TIA), and cerebral infarction without residual deficits: Secondary | ICD-10-CM | POA: Diagnosis not present

## 2018-06-28 DIAGNOSIS — N200 Calculus of kidney: Secondary | ICD-10-CM | POA: Diagnosis not present

## 2018-06-28 DIAGNOSIS — I700 Atherosclerosis of aorta: Secondary | ICD-10-CM | POA: Diagnosis not present

## 2018-06-28 DIAGNOSIS — K219 Gastro-esophageal reflux disease without esophagitis: Secondary | ICD-10-CM | POA: Diagnosis not present

## 2018-06-28 DIAGNOSIS — Z950 Presence of cardiac pacemaker: Secondary | ICD-10-CM | POA: Diagnosis not present

## 2018-06-28 DIAGNOSIS — R7303 Prediabetes: Secondary | ICD-10-CM | POA: Diagnosis not present

## 2018-06-28 DIAGNOSIS — E785 Hyperlipidemia, unspecified: Secondary | ICD-10-CM | POA: Diagnosis not present

## 2018-06-28 DIAGNOSIS — I495 Sick sinus syndrome: Secondary | ICD-10-CM | POA: Diagnosis not present

## 2018-06-28 DIAGNOSIS — J9611 Chronic respiratory failure with hypoxia: Secondary | ICD-10-CM | POA: Diagnosis not present

## 2018-06-28 DIAGNOSIS — I5032 Chronic diastolic (congestive) heart failure: Secondary | ICD-10-CM | POA: Diagnosis not present

## 2018-06-28 DIAGNOSIS — I4821 Permanent atrial fibrillation: Secondary | ICD-10-CM | POA: Diagnosis not present

## 2018-06-28 DIAGNOSIS — M1712 Unilateral primary osteoarthritis, left knee: Secondary | ICD-10-CM | POA: Diagnosis not present

## 2018-06-28 DIAGNOSIS — J439 Emphysema, unspecified: Secondary | ICD-10-CM | POA: Diagnosis not present

## 2018-06-28 DIAGNOSIS — I951 Orthostatic hypotension: Secondary | ICD-10-CM | POA: Diagnosis not present

## 2018-06-28 DIAGNOSIS — Z79891 Long term (current) use of opiate analgesic: Secondary | ICD-10-CM | POA: Diagnosis not present

## 2018-06-28 DIAGNOSIS — Z993 Dependence on wheelchair: Secondary | ICD-10-CM | POA: Diagnosis not present

## 2018-06-28 DIAGNOSIS — I351 Nonrheumatic aortic (valve) insufficiency: Secondary | ICD-10-CM | POA: Diagnosis not present

## 2018-06-29 DIAGNOSIS — K219 Gastro-esophageal reflux disease without esophagitis: Secondary | ICD-10-CM | POA: Diagnosis not present

## 2018-06-29 DIAGNOSIS — Z993 Dependence on wheelchair: Secondary | ICD-10-CM | POA: Diagnosis not present

## 2018-06-29 DIAGNOSIS — Z950 Presence of cardiac pacemaker: Secondary | ICD-10-CM | POA: Diagnosis not present

## 2018-06-29 DIAGNOSIS — I351 Nonrheumatic aortic (valve) insufficiency: Secondary | ICD-10-CM | POA: Diagnosis not present

## 2018-06-29 DIAGNOSIS — Z8673 Personal history of transient ischemic attack (TIA), and cerebral infarction without residual deficits: Secondary | ICD-10-CM | POA: Diagnosis not present

## 2018-06-29 DIAGNOSIS — R7303 Prediabetes: Secondary | ICD-10-CM | POA: Diagnosis not present

## 2018-06-29 DIAGNOSIS — M1712 Unilateral primary osteoarthritis, left knee: Secondary | ICD-10-CM | POA: Diagnosis not present

## 2018-06-29 DIAGNOSIS — I951 Orthostatic hypotension: Secondary | ICD-10-CM | POA: Diagnosis not present

## 2018-06-29 DIAGNOSIS — I130 Hypertensive heart and chronic kidney disease with heart failure and stage 1 through stage 4 chronic kidney disease, or unspecified chronic kidney disease: Secondary | ICD-10-CM | POA: Diagnosis not present

## 2018-06-29 DIAGNOSIS — J439 Emphysema, unspecified: Secondary | ICD-10-CM | POA: Diagnosis not present

## 2018-06-29 DIAGNOSIS — J9611 Chronic respiratory failure with hypoxia: Secondary | ICD-10-CM | POA: Diagnosis not present

## 2018-06-29 DIAGNOSIS — Z79891 Long term (current) use of opiate analgesic: Secondary | ICD-10-CM | POA: Diagnosis not present

## 2018-06-29 DIAGNOSIS — I495 Sick sinus syndrome: Secondary | ICD-10-CM | POA: Diagnosis not present

## 2018-06-29 DIAGNOSIS — Z9181 History of falling: Secondary | ICD-10-CM | POA: Diagnosis not present

## 2018-06-29 DIAGNOSIS — I700 Atherosclerosis of aorta: Secondary | ICD-10-CM | POA: Diagnosis not present

## 2018-06-29 DIAGNOSIS — E785 Hyperlipidemia, unspecified: Secondary | ICD-10-CM | POA: Diagnosis not present

## 2018-06-29 DIAGNOSIS — I4821 Permanent atrial fibrillation: Secondary | ICD-10-CM | POA: Diagnosis not present

## 2018-06-29 DIAGNOSIS — N183 Chronic kidney disease, stage 3 (moderate): Secondary | ICD-10-CM | POA: Diagnosis not present

## 2018-06-29 DIAGNOSIS — N200 Calculus of kidney: Secondary | ICD-10-CM | POA: Diagnosis not present

## 2018-06-29 DIAGNOSIS — J449 Chronic obstructive pulmonary disease, unspecified: Secondary | ICD-10-CM | POA: Diagnosis not present

## 2018-06-29 DIAGNOSIS — I5032 Chronic diastolic (congestive) heart failure: Secondary | ICD-10-CM | POA: Diagnosis not present

## 2018-06-30 ENCOUNTER — Telehealth: Payer: Self-pay | Admitting: Cardiovascular Disease

## 2018-06-30 DIAGNOSIS — J439 Emphysema, unspecified: Secondary | ICD-10-CM | POA: Diagnosis not present

## 2018-06-30 DIAGNOSIS — I951 Orthostatic hypotension: Secondary | ICD-10-CM | POA: Diagnosis not present

## 2018-06-30 DIAGNOSIS — Z950 Presence of cardiac pacemaker: Secondary | ICD-10-CM | POA: Diagnosis not present

## 2018-06-30 DIAGNOSIS — N183 Chronic kidney disease, stage 3 (moderate): Secondary | ICD-10-CM | POA: Diagnosis not present

## 2018-06-30 DIAGNOSIS — I5032 Chronic diastolic (congestive) heart failure: Secondary | ICD-10-CM | POA: Diagnosis not present

## 2018-06-30 DIAGNOSIS — I130 Hypertensive heart and chronic kidney disease with heart failure and stage 1 through stage 4 chronic kidney disease, or unspecified chronic kidney disease: Secondary | ICD-10-CM | POA: Diagnosis not present

## 2018-06-30 DIAGNOSIS — Z8673 Personal history of transient ischemic attack (TIA), and cerebral infarction without residual deficits: Secondary | ICD-10-CM | POA: Diagnosis not present

## 2018-06-30 DIAGNOSIS — Z79891 Long term (current) use of opiate analgesic: Secondary | ICD-10-CM | POA: Diagnosis not present

## 2018-06-30 DIAGNOSIS — I495 Sick sinus syndrome: Secondary | ICD-10-CM | POA: Diagnosis not present

## 2018-06-30 DIAGNOSIS — I351 Nonrheumatic aortic (valve) insufficiency: Secondary | ICD-10-CM | POA: Diagnosis not present

## 2018-06-30 DIAGNOSIS — M1712 Unilateral primary osteoarthritis, left knee: Secondary | ICD-10-CM | POA: Diagnosis not present

## 2018-06-30 DIAGNOSIS — R7303 Prediabetes: Secondary | ICD-10-CM | POA: Diagnosis not present

## 2018-06-30 DIAGNOSIS — Z993 Dependence on wheelchair: Secondary | ICD-10-CM | POA: Diagnosis not present

## 2018-06-30 DIAGNOSIS — N200 Calculus of kidney: Secondary | ICD-10-CM | POA: Diagnosis not present

## 2018-06-30 DIAGNOSIS — I700 Atherosclerosis of aorta: Secondary | ICD-10-CM | POA: Diagnosis not present

## 2018-06-30 DIAGNOSIS — I4821 Permanent atrial fibrillation: Secondary | ICD-10-CM | POA: Diagnosis not present

## 2018-06-30 DIAGNOSIS — K219 Gastro-esophageal reflux disease without esophagitis: Secondary | ICD-10-CM | POA: Diagnosis not present

## 2018-06-30 DIAGNOSIS — J9611 Chronic respiratory failure with hypoxia: Secondary | ICD-10-CM | POA: Diagnosis not present

## 2018-06-30 DIAGNOSIS — Z9181 History of falling: Secondary | ICD-10-CM | POA: Diagnosis not present

## 2018-06-30 DIAGNOSIS — E785 Hyperlipidemia, unspecified: Secondary | ICD-10-CM | POA: Diagnosis not present

## 2018-06-30 NOTE — Telephone Encounter (Signed)
°  Lavell from [facility] calling to report weight gain overnight. Please call [phone]  Pt c/o swelling: STAT is pt has developed SOB within 24 hours  1) How much weight have you gained and in what time span? 3 pounds overnight  2) If swelling, where is the swelling located? Some swelling in legs  3) Are you currently taking a fluid pill? YES  4) Are you currently SOB? Per daughter, no more than usual , wears o2  5) Do you have a log of your daily weights (if so, list)? 150 on 10/8, 153 on today  6) Have you gained 3 pounds in a day or 5 pounds in a week? yes  7) Have you traveled recently?  NO

## 2018-06-30 NOTE — Telephone Encounter (Signed)
Returned call to [facility] with [facility] home health.Calling to report patient had a 3 lb weight gain over night.Sob no worse.Spoke to [employer] he advised ok to double lasix dose for 3 days only then return to normal dose.Advised to call back if continues to have weight gain.

## 2018-06-30 NOTE — Telephone Encounter (Signed)
I see an order for BMET on 10/2, but no results. 1 of the phone note states that the home health nurse did a BMET. I may not be looking in the right place, but if I am, can we get results on that? Thanks

## 2018-07-06 DIAGNOSIS — I130 Hypertensive heart and chronic kidney disease with heart failure and stage 1 through stage 4 chronic kidney disease, or unspecified chronic kidney disease: Secondary | ICD-10-CM | POA: Diagnosis not present

## 2018-07-06 DIAGNOSIS — Z950 Presence of cardiac pacemaker: Secondary | ICD-10-CM | POA: Diagnosis not present

## 2018-07-06 DIAGNOSIS — Z9181 History of falling: Secondary | ICD-10-CM | POA: Diagnosis not present

## 2018-07-06 DIAGNOSIS — Z79891 Long term (current) use of opiate analgesic: Secondary | ICD-10-CM | POA: Diagnosis not present

## 2018-07-06 DIAGNOSIS — I951 Orthostatic hypotension: Secondary | ICD-10-CM | POA: Diagnosis not present

## 2018-07-06 DIAGNOSIS — I700 Atherosclerosis of aorta: Secondary | ICD-10-CM | POA: Diagnosis not present

## 2018-07-06 DIAGNOSIS — I351 Nonrheumatic aortic (valve) insufficiency: Secondary | ICD-10-CM | POA: Diagnosis not present

## 2018-07-06 DIAGNOSIS — M25562 Pain in left knee: Secondary | ICD-10-CM | POA: Diagnosis not present

## 2018-07-06 DIAGNOSIS — Z23 Encounter for immunization: Secondary | ICD-10-CM | POA: Diagnosis not present

## 2018-07-06 DIAGNOSIS — K219 Gastro-esophageal reflux disease without esophagitis: Secondary | ICD-10-CM | POA: Diagnosis not present

## 2018-07-06 DIAGNOSIS — M1712 Unilateral primary osteoarthritis, left knee: Secondary | ICD-10-CM | POA: Diagnosis not present

## 2018-07-06 DIAGNOSIS — J449 Chronic obstructive pulmonary disease, unspecified: Secondary | ICD-10-CM | POA: Diagnosis not present

## 2018-07-06 DIAGNOSIS — I495 Sick sinus syndrome: Secondary | ICD-10-CM | POA: Diagnosis not present

## 2018-07-06 DIAGNOSIS — I5032 Chronic diastolic (congestive) heart failure: Secondary | ICD-10-CM | POA: Diagnosis not present

## 2018-07-06 DIAGNOSIS — Z993 Dependence on wheelchair: Secondary | ICD-10-CM | POA: Diagnosis not present

## 2018-07-06 DIAGNOSIS — J9611 Chronic respiratory failure with hypoxia: Secondary | ICD-10-CM | POA: Diagnosis not present

## 2018-07-06 DIAGNOSIS — Z8673 Personal history of transient ischemic attack (TIA), and cerebral infarction without residual deficits: Secondary | ICD-10-CM | POA: Diagnosis not present

## 2018-07-06 DIAGNOSIS — E785 Hyperlipidemia, unspecified: Secondary | ICD-10-CM | POA: Diagnosis not present

## 2018-07-06 DIAGNOSIS — R5381 Other malaise: Secondary | ICD-10-CM | POA: Diagnosis not present

## 2018-07-06 DIAGNOSIS — N200 Calculus of kidney: Secondary | ICD-10-CM | POA: Diagnosis not present

## 2018-07-06 DIAGNOSIS — R7303 Prediabetes: Secondary | ICD-10-CM | POA: Diagnosis not present

## 2018-07-06 DIAGNOSIS — N183 Chronic kidney disease, stage 3 (moderate): Secondary | ICD-10-CM | POA: Diagnosis not present

## 2018-07-06 DIAGNOSIS — J439 Emphysema, unspecified: Secondary | ICD-10-CM | POA: Diagnosis not present

## 2018-07-06 DIAGNOSIS — I4821 Permanent atrial fibrillation: Secondary | ICD-10-CM | POA: Diagnosis not present

## 2018-07-06 DIAGNOSIS — I509 Heart failure, unspecified: Secondary | ICD-10-CM | POA: Diagnosis not present

## 2018-07-06 NOTE — Telephone Encounter (Signed)
Spoke with Home Health nurse and she stated she would have the results faxed to me tomorrow and if I did not receive them for me to give her another call.

## 2018-07-07 DIAGNOSIS — I700 Atherosclerosis of aorta: Secondary | ICD-10-CM | POA: Diagnosis not present

## 2018-07-07 DIAGNOSIS — Z8673 Personal history of transient ischemic attack (TIA), and cerebral infarction without residual deficits: Secondary | ICD-10-CM | POA: Diagnosis not present

## 2018-07-07 DIAGNOSIS — K219 Gastro-esophageal reflux disease without esophagitis: Secondary | ICD-10-CM | POA: Diagnosis not present

## 2018-07-07 DIAGNOSIS — I4821 Permanent atrial fibrillation: Secondary | ICD-10-CM | POA: Diagnosis not present

## 2018-07-07 DIAGNOSIS — Z79891 Long term (current) use of opiate analgesic: Secondary | ICD-10-CM | POA: Diagnosis not present

## 2018-07-07 DIAGNOSIS — N200 Calculus of kidney: Secondary | ICD-10-CM | POA: Diagnosis not present

## 2018-07-07 DIAGNOSIS — Z950 Presence of cardiac pacemaker: Secondary | ICD-10-CM | POA: Diagnosis not present

## 2018-07-07 DIAGNOSIS — I495 Sick sinus syndrome: Secondary | ICD-10-CM | POA: Diagnosis not present

## 2018-07-07 DIAGNOSIS — I951 Orthostatic hypotension: Secondary | ICD-10-CM | POA: Diagnosis not present

## 2018-07-07 DIAGNOSIS — Z993 Dependence on wheelchair: Secondary | ICD-10-CM | POA: Diagnosis not present

## 2018-07-07 DIAGNOSIS — I5032 Chronic diastolic (congestive) heart failure: Secondary | ICD-10-CM | POA: Diagnosis not present

## 2018-07-07 DIAGNOSIS — N183 Chronic kidney disease, stage 3 (moderate): Secondary | ICD-10-CM | POA: Diagnosis not present

## 2018-07-07 DIAGNOSIS — I130 Hypertensive heart and chronic kidney disease with heart failure and stage 1 through stage 4 chronic kidney disease, or unspecified chronic kidney disease: Secondary | ICD-10-CM | POA: Diagnosis not present

## 2018-07-07 DIAGNOSIS — I351 Nonrheumatic aortic (valve) insufficiency: Secondary | ICD-10-CM | POA: Diagnosis not present

## 2018-07-07 DIAGNOSIS — J439 Emphysema, unspecified: Secondary | ICD-10-CM | POA: Diagnosis not present

## 2018-07-07 DIAGNOSIS — E785 Hyperlipidemia, unspecified: Secondary | ICD-10-CM | POA: Diagnosis not present

## 2018-07-07 DIAGNOSIS — M1712 Unilateral primary osteoarthritis, left knee: Secondary | ICD-10-CM | POA: Diagnosis not present

## 2018-07-07 DIAGNOSIS — J9611 Chronic respiratory failure with hypoxia: Secondary | ICD-10-CM | POA: Diagnosis not present

## 2018-07-07 DIAGNOSIS — Z9181 History of falling: Secondary | ICD-10-CM | POA: Diagnosis not present

## 2018-07-07 DIAGNOSIS — R7303 Prediabetes: Secondary | ICD-10-CM | POA: Diagnosis not present

## 2018-07-08 DIAGNOSIS — N200 Calculus of kidney: Secondary | ICD-10-CM | POA: Diagnosis not present

## 2018-07-08 DIAGNOSIS — N183 Chronic kidney disease, stage 3 (moderate): Secondary | ICD-10-CM | POA: Diagnosis not present

## 2018-07-08 DIAGNOSIS — I495 Sick sinus syndrome: Secondary | ICD-10-CM | POA: Diagnosis not present

## 2018-07-08 DIAGNOSIS — R7303 Prediabetes: Secondary | ICD-10-CM | POA: Diagnosis not present

## 2018-07-08 DIAGNOSIS — I130 Hypertensive heart and chronic kidney disease with heart failure and stage 1 through stage 4 chronic kidney disease, or unspecified chronic kidney disease: Secondary | ICD-10-CM | POA: Diagnosis not present

## 2018-07-08 DIAGNOSIS — I5032 Chronic diastolic (congestive) heart failure: Secondary | ICD-10-CM | POA: Diagnosis not present

## 2018-07-08 DIAGNOSIS — M1712 Unilateral primary osteoarthritis, left knee: Secondary | ICD-10-CM | POA: Diagnosis not present

## 2018-07-08 DIAGNOSIS — I951 Orthostatic hypotension: Secondary | ICD-10-CM | POA: Diagnosis not present

## 2018-07-08 DIAGNOSIS — J439 Emphysema, unspecified: Secondary | ICD-10-CM | POA: Diagnosis not present

## 2018-07-08 DIAGNOSIS — Z8673 Personal history of transient ischemic attack (TIA), and cerebral infarction without residual deficits: Secondary | ICD-10-CM | POA: Diagnosis not present

## 2018-07-08 DIAGNOSIS — K219 Gastro-esophageal reflux disease without esophagitis: Secondary | ICD-10-CM | POA: Diagnosis not present

## 2018-07-08 DIAGNOSIS — J9611 Chronic respiratory failure with hypoxia: Secondary | ICD-10-CM | POA: Diagnosis not present

## 2018-07-08 DIAGNOSIS — Z79891 Long term (current) use of opiate analgesic: Secondary | ICD-10-CM | POA: Diagnosis not present

## 2018-07-08 DIAGNOSIS — Z950 Presence of cardiac pacemaker: Secondary | ICD-10-CM | POA: Diagnosis not present

## 2018-07-08 DIAGNOSIS — I700 Atherosclerosis of aorta: Secondary | ICD-10-CM | POA: Diagnosis not present

## 2018-07-08 DIAGNOSIS — Z993 Dependence on wheelchair: Secondary | ICD-10-CM | POA: Diagnosis not present

## 2018-07-08 DIAGNOSIS — I351 Nonrheumatic aortic (valve) insufficiency: Secondary | ICD-10-CM | POA: Diagnosis not present

## 2018-07-08 DIAGNOSIS — E785 Hyperlipidemia, unspecified: Secondary | ICD-10-CM | POA: Diagnosis not present

## 2018-07-08 DIAGNOSIS — Z9181 History of falling: Secondary | ICD-10-CM | POA: Diagnosis not present

## 2018-07-08 DIAGNOSIS — I4821 Permanent atrial fibrillation: Secondary | ICD-10-CM | POA: Diagnosis not present

## 2018-07-08 NOTE — Telephone Encounter (Signed)
Received labs 07/08/2015. Suanne Marker will review.

## 2018-07-12 ENCOUNTER — Telehealth: Payer: Self-pay | Admitting: Cardiovascular Disease

## 2018-07-12 NOTE — Telephone Encounter (Signed)
Instructed the daughter to follow the direction previously given for lasix. Per the daughter the patient is not increasingly short of breath, does not have indication of DVT to the one leg, and is not in-taking high amounts of fluid or sodium. The daughter will log the patient's BP (twice daily) and weight for the patient's appt next week. Instructed the daughter to hold lasix if the patient's BP is low and call the office. Daughter will monitor the patient's sodium and water in-take.

## 2018-07-12 NOTE — Telephone Encounter (Signed)
Unable to reach patient, will try again later.

## 2018-07-12 NOTE — Telephone Encounter (Signed)
Spoke with Raven Gray ans she stated she called [facility] and someone contact our office. She states she was instructed to increase potassium for 3 day and I informed her the Rhonda's instructions were for 2 days but the 3 day recommendation was fine.

## 2018-07-12 NOTE — Telephone Encounter (Signed)
New Message   Pt c/o swelling: STAT is pt has developed SOB within 24 hours  1) How much weight have you gained and in what time span? 4lb overnight   2) If swelling, where is the swelling located? Lower extremities worse in the left leg  3) Are you currently taking a fluid pill? Yes, took 40mg  of furosemide this morning   4) Are you currently SOB? Yes, but the patient is normally sob   5) Do you have a log of your daily weights (if so, list)? 154, 150  6) Have you gained 3 pounds in a day or 5 pounds in a week? Yes   7) Have you traveled recently? No

## 2018-07-13 DIAGNOSIS — R7303 Prediabetes: Secondary | ICD-10-CM | POA: Diagnosis not present

## 2018-07-13 DIAGNOSIS — Z8673 Personal history of transient ischemic attack (TIA), and cerebral infarction without residual deficits: Secondary | ICD-10-CM | POA: Diagnosis not present

## 2018-07-13 DIAGNOSIS — Z9181 History of falling: Secondary | ICD-10-CM | POA: Diagnosis not present

## 2018-07-13 DIAGNOSIS — I700 Atherosclerosis of aorta: Secondary | ICD-10-CM | POA: Diagnosis not present

## 2018-07-13 DIAGNOSIS — I130 Hypertensive heart and chronic kidney disease with heart failure and stage 1 through stage 4 chronic kidney disease, or unspecified chronic kidney disease: Secondary | ICD-10-CM | POA: Diagnosis not present

## 2018-07-13 DIAGNOSIS — I495 Sick sinus syndrome: Secondary | ICD-10-CM | POA: Diagnosis not present

## 2018-07-13 DIAGNOSIS — I5032 Chronic diastolic (congestive) heart failure: Secondary | ICD-10-CM | POA: Diagnosis not present

## 2018-07-13 DIAGNOSIS — K219 Gastro-esophageal reflux disease without esophagitis: Secondary | ICD-10-CM | POA: Diagnosis not present

## 2018-07-13 DIAGNOSIS — Z993 Dependence on wheelchair: Secondary | ICD-10-CM | POA: Diagnosis not present

## 2018-07-13 DIAGNOSIS — J9611 Chronic respiratory failure with hypoxia: Secondary | ICD-10-CM | POA: Diagnosis not present

## 2018-07-13 DIAGNOSIS — N183 Chronic kidney disease, stage 3 (moderate): Secondary | ICD-10-CM | POA: Diagnosis not present

## 2018-07-13 DIAGNOSIS — I351 Nonrheumatic aortic (valve) insufficiency: Secondary | ICD-10-CM | POA: Diagnosis not present

## 2018-07-13 DIAGNOSIS — M1712 Unilateral primary osteoarthritis, left knee: Secondary | ICD-10-CM | POA: Diagnosis not present

## 2018-07-13 DIAGNOSIS — J439 Emphysema, unspecified: Secondary | ICD-10-CM | POA: Diagnosis not present

## 2018-07-13 DIAGNOSIS — I4821 Permanent atrial fibrillation: Secondary | ICD-10-CM | POA: Diagnosis not present

## 2018-07-13 DIAGNOSIS — Z79891 Long term (current) use of opiate analgesic: Secondary | ICD-10-CM | POA: Diagnosis not present

## 2018-07-13 DIAGNOSIS — I951 Orthostatic hypotension: Secondary | ICD-10-CM | POA: Diagnosis not present

## 2018-07-13 DIAGNOSIS — E785 Hyperlipidemia, unspecified: Secondary | ICD-10-CM | POA: Diagnosis not present

## 2018-07-13 DIAGNOSIS — Z950 Presence of cardiac pacemaker: Secondary | ICD-10-CM | POA: Diagnosis not present

## 2018-07-13 DIAGNOSIS — N200 Calculus of kidney: Secondary | ICD-10-CM | POA: Diagnosis not present

## 2018-07-15 DIAGNOSIS — K219 Gastro-esophageal reflux disease without esophagitis: Secondary | ICD-10-CM | POA: Diagnosis not present

## 2018-07-15 DIAGNOSIS — N183 Chronic kidney disease, stage 3 (moderate): Secondary | ICD-10-CM | POA: Diagnosis not present

## 2018-07-15 DIAGNOSIS — Z9181 History of falling: Secondary | ICD-10-CM | POA: Diagnosis not present

## 2018-07-15 DIAGNOSIS — Z8673 Personal history of transient ischemic attack (TIA), and cerebral infarction without residual deficits: Secondary | ICD-10-CM | POA: Diagnosis not present

## 2018-07-15 DIAGNOSIS — Z993 Dependence on wheelchair: Secondary | ICD-10-CM | POA: Diagnosis not present

## 2018-07-15 DIAGNOSIS — R7303 Prediabetes: Secondary | ICD-10-CM | POA: Diagnosis not present

## 2018-07-15 DIAGNOSIS — N200 Calculus of kidney: Secondary | ICD-10-CM | POA: Diagnosis not present

## 2018-07-15 DIAGNOSIS — E785 Hyperlipidemia, unspecified: Secondary | ICD-10-CM | POA: Diagnosis not present

## 2018-07-15 DIAGNOSIS — I700 Atherosclerosis of aorta: Secondary | ICD-10-CM | POA: Diagnosis not present

## 2018-07-15 DIAGNOSIS — I130 Hypertensive heart and chronic kidney disease with heart failure and stage 1 through stage 4 chronic kidney disease, or unspecified chronic kidney disease: Secondary | ICD-10-CM | POA: Diagnosis not present

## 2018-07-15 DIAGNOSIS — I495 Sick sinus syndrome: Secondary | ICD-10-CM | POA: Diagnosis not present

## 2018-07-15 DIAGNOSIS — Z79891 Long term (current) use of opiate analgesic: Secondary | ICD-10-CM | POA: Diagnosis not present

## 2018-07-15 DIAGNOSIS — I351 Nonrheumatic aortic (valve) insufficiency: Secondary | ICD-10-CM | POA: Diagnosis not present

## 2018-07-15 DIAGNOSIS — I5032 Chronic diastolic (congestive) heart failure: Secondary | ICD-10-CM | POA: Diagnosis not present

## 2018-07-15 DIAGNOSIS — M1712 Unilateral primary osteoarthritis, left knee: Secondary | ICD-10-CM | POA: Diagnosis not present

## 2018-07-15 DIAGNOSIS — I4821 Permanent atrial fibrillation: Secondary | ICD-10-CM | POA: Diagnosis not present

## 2018-07-15 DIAGNOSIS — Z950 Presence of cardiac pacemaker: Secondary | ICD-10-CM | POA: Diagnosis not present

## 2018-07-15 DIAGNOSIS — J9611 Chronic respiratory failure with hypoxia: Secondary | ICD-10-CM | POA: Diagnosis not present

## 2018-07-15 DIAGNOSIS — J439 Emphysema, unspecified: Secondary | ICD-10-CM | POA: Diagnosis not present

## 2018-07-15 DIAGNOSIS — I951 Orthostatic hypotension: Secondary | ICD-10-CM | POA: Diagnosis not present

## 2018-07-16 DIAGNOSIS — Z950 Presence of cardiac pacemaker: Secondary | ICD-10-CM | POA: Diagnosis not present

## 2018-07-16 DIAGNOSIS — Z9181 History of falling: Secondary | ICD-10-CM | POA: Diagnosis not present

## 2018-07-16 DIAGNOSIS — I5032 Chronic diastolic (congestive) heart failure: Secondary | ICD-10-CM | POA: Diagnosis not present

## 2018-07-16 DIAGNOSIS — I4821 Permanent atrial fibrillation: Secondary | ICD-10-CM | POA: Diagnosis not present

## 2018-07-16 DIAGNOSIS — I130 Hypertensive heart and chronic kidney disease with heart failure and stage 1 through stage 4 chronic kidney disease, or unspecified chronic kidney disease: Secondary | ICD-10-CM | POA: Diagnosis not present

## 2018-07-16 DIAGNOSIS — J9611 Chronic respiratory failure with hypoxia: Secondary | ICD-10-CM | POA: Diagnosis not present

## 2018-07-16 DIAGNOSIS — R7303 Prediabetes: Secondary | ICD-10-CM | POA: Diagnosis not present

## 2018-07-16 DIAGNOSIS — E785 Hyperlipidemia, unspecified: Secondary | ICD-10-CM | POA: Diagnosis not present

## 2018-07-16 DIAGNOSIS — N183 Chronic kidney disease, stage 3 (moderate): Secondary | ICD-10-CM | POA: Diagnosis not present

## 2018-07-16 DIAGNOSIS — I951 Orthostatic hypotension: Secondary | ICD-10-CM | POA: Diagnosis not present

## 2018-07-16 DIAGNOSIS — I351 Nonrheumatic aortic (valve) insufficiency: Secondary | ICD-10-CM | POA: Diagnosis not present

## 2018-07-16 DIAGNOSIS — J439 Emphysema, unspecified: Secondary | ICD-10-CM | POA: Diagnosis not present

## 2018-07-16 DIAGNOSIS — Z79891 Long term (current) use of opiate analgesic: Secondary | ICD-10-CM | POA: Diagnosis not present

## 2018-07-16 DIAGNOSIS — K219 Gastro-esophageal reflux disease without esophagitis: Secondary | ICD-10-CM | POA: Diagnosis not present

## 2018-07-16 DIAGNOSIS — M1712 Unilateral primary osteoarthritis, left knee: Secondary | ICD-10-CM | POA: Diagnosis not present

## 2018-07-16 DIAGNOSIS — I700 Atherosclerosis of aorta: Secondary | ICD-10-CM | POA: Diagnosis not present

## 2018-07-16 DIAGNOSIS — N200 Calculus of kidney: Secondary | ICD-10-CM | POA: Diagnosis not present

## 2018-07-16 DIAGNOSIS — Z8673 Personal history of transient ischemic attack (TIA), and cerebral infarction without residual deficits: Secondary | ICD-10-CM | POA: Diagnosis not present

## 2018-07-16 DIAGNOSIS — Z993 Dependence on wheelchair: Secondary | ICD-10-CM | POA: Diagnosis not present

## 2018-07-16 DIAGNOSIS — I495 Sick sinus syndrome: Secondary | ICD-10-CM | POA: Diagnosis not present

## 2018-07-19 ENCOUNTER — Ambulatory Visit (INDEPENDENT_AMBULATORY_CARE_PROVIDER_SITE_OTHER): Payer: Medicare Other | Admitting: *Deleted

## 2018-07-19 ENCOUNTER — Other Ambulatory Visit: Payer: Medicare Other

## 2018-07-19 ENCOUNTER — Telehealth: Payer: Self-pay | Admitting: Cardiology

## 2018-07-19 ENCOUNTER — Ambulatory Visit: Payer: Medicare Other | Admitting: Hematology

## 2018-07-19 DIAGNOSIS — I495 Sick sinus syndrome: Secondary | ICD-10-CM | POA: Diagnosis not present

## 2018-07-19 DIAGNOSIS — R55 Syncope and collapse: Secondary | ICD-10-CM

## 2018-07-19 NOTE — Telephone Encounter (Signed)
LMOVM reminding pt to send remote transmission.   

## 2018-07-20 DIAGNOSIS — R7303 Prediabetes: Secondary | ICD-10-CM | POA: Diagnosis not present

## 2018-07-20 DIAGNOSIS — M1712 Unilateral primary osteoarthritis, left knee: Secondary | ICD-10-CM | POA: Diagnosis not present

## 2018-07-20 DIAGNOSIS — Z79891 Long term (current) use of opiate analgesic: Secondary | ICD-10-CM | POA: Diagnosis not present

## 2018-07-20 DIAGNOSIS — I700 Atherosclerosis of aorta: Secondary | ICD-10-CM | POA: Diagnosis not present

## 2018-07-20 DIAGNOSIS — I495 Sick sinus syndrome: Secondary | ICD-10-CM | POA: Diagnosis not present

## 2018-07-20 DIAGNOSIS — I951 Orthostatic hypotension: Secondary | ICD-10-CM | POA: Diagnosis not present

## 2018-07-20 DIAGNOSIS — Z9181 History of falling: Secondary | ICD-10-CM | POA: Diagnosis not present

## 2018-07-20 DIAGNOSIS — Z8673 Personal history of transient ischemic attack (TIA), and cerebral infarction without residual deficits: Secondary | ICD-10-CM | POA: Diagnosis not present

## 2018-07-20 DIAGNOSIS — Z993 Dependence on wheelchair: Secondary | ICD-10-CM | POA: Diagnosis not present

## 2018-07-20 DIAGNOSIS — J9611 Chronic respiratory failure with hypoxia: Secondary | ICD-10-CM | POA: Diagnosis not present

## 2018-07-20 DIAGNOSIS — J439 Emphysema, unspecified: Secondary | ICD-10-CM | POA: Diagnosis not present

## 2018-07-20 DIAGNOSIS — I351 Nonrheumatic aortic (valve) insufficiency: Secondary | ICD-10-CM | POA: Diagnosis not present

## 2018-07-20 DIAGNOSIS — N183 Chronic kidney disease, stage 3 (moderate): Secondary | ICD-10-CM | POA: Diagnosis not present

## 2018-07-20 DIAGNOSIS — I5032 Chronic diastolic (congestive) heart failure: Secondary | ICD-10-CM | POA: Diagnosis not present

## 2018-07-20 DIAGNOSIS — N200 Calculus of kidney: Secondary | ICD-10-CM | POA: Diagnosis not present

## 2018-07-20 DIAGNOSIS — K219 Gastro-esophageal reflux disease without esophagitis: Secondary | ICD-10-CM | POA: Diagnosis not present

## 2018-07-20 DIAGNOSIS — I4821 Permanent atrial fibrillation: Secondary | ICD-10-CM | POA: Diagnosis not present

## 2018-07-20 DIAGNOSIS — I130 Hypertensive heart and chronic kidney disease with heart failure and stage 1 through stage 4 chronic kidney disease, or unspecified chronic kidney disease: Secondary | ICD-10-CM | POA: Diagnosis not present

## 2018-07-20 DIAGNOSIS — Z950 Presence of cardiac pacemaker: Secondary | ICD-10-CM | POA: Diagnosis not present

## 2018-07-20 DIAGNOSIS — E785 Hyperlipidemia, unspecified: Secondary | ICD-10-CM | POA: Diagnosis not present

## 2018-07-20 NOTE — Progress Notes (Signed)
Remote pacemaker transmission.   

## 2018-07-21 ENCOUNTER — Ambulatory Visit (INDEPENDENT_AMBULATORY_CARE_PROVIDER_SITE_OTHER): Payer: Medicare Other | Admitting: Cardiovascular Disease

## 2018-07-21 ENCOUNTER — Encounter: Payer: Self-pay | Admitting: Cardiovascular Disease

## 2018-07-21 VITALS — BP 154/75 | HR 57 | Ht 63.0 in | Wt 158.2 lb

## 2018-07-21 DIAGNOSIS — Z7901 Long term (current) use of anticoagulants: Secondary | ICD-10-CM

## 2018-07-21 DIAGNOSIS — Z86711 Personal history of pulmonary embolism: Secondary | ICD-10-CM

## 2018-07-21 DIAGNOSIS — I10 Essential (primary) hypertension: Secondary | ICD-10-CM

## 2018-07-21 DIAGNOSIS — Z950 Presence of cardiac pacemaker: Secondary | ICD-10-CM | POA: Diagnosis not present

## 2018-07-21 DIAGNOSIS — I472 Ventricular tachycardia: Secondary | ICD-10-CM

## 2018-07-21 DIAGNOSIS — I5033 Acute on chronic diastolic (congestive) heart failure: Secondary | ICD-10-CM | POA: Diagnosis not present

## 2018-07-21 DIAGNOSIS — I4821 Permanent atrial fibrillation: Secondary | ICD-10-CM

## 2018-07-21 DIAGNOSIS — I4729 Other ventricular tachycardia: Secondary | ICD-10-CM

## 2018-07-21 MED ORDER — AMLODIPINE BESYLATE 2.5 MG PO TABS: 2.5000 mg | ORAL_TABLET | Freq: Every day | ORAL | 3 refills | Status: DC

## 2018-07-21 MED ORDER — FUROSEMIDE 40 MG PO TABS: ORAL_TABLET | ORAL | 3 refills | Status: AC

## 2018-07-21 MED ORDER — METOPROLOL TARTRATE 25 MG PO TABS: 12.5000 mg | ORAL_TABLET | Freq: Two times a day (BID) | ORAL | 3 refills | Status: AC

## 2018-07-21 NOTE — Patient Instructions (Signed)
Medication Instructions:  Dr Sallyanne Kuster has recommended making the following medication changes: 1. TAKE Metoprolol 12.5 mg (0.5 tablet) TWICE daily 2. TAKE Furosemide 40 mg (1 tablet) Tuesdays, Thursdays, Saturdays, and Sundays AND 20 mg (0.5 tablet) Mondays, Wednesdays, and Fridays 3. START Amlodipine 2.5 mg - take 1 tablet daily  If you need a refill on your cardiac medications before your next appointment, please call your pharmacy.   Lab work: Your physician recommends that you return for lab work in 3 weeks.  If you have labs (blood work) drawn today and your tests are completely normal, you will receive your results only by: Marland Kitchen MyChart Message (if you have MyChart) OR . A paper copy in the mail If you have any lab test that is abnormal or we need to change your treatment, we will call you to review the results.  Follow-Up: At [facility], you and your health needs are our priority.  As part of our continuing mission to provide you with exceptional heart care, we have created designated Provider Care Teams.  These Care Teams include your primary Cardiologist (physician) and Advanced Practice Providers (APPs -  Physician Assistants and Nurse Practitioners) who all work together to provide you with the care you need, when you need it. You will need a follow up appointment in 12 months.  Please call our office 2 months in advance to schedule this appointment.  You may see Sanda Klein, MD or one of the following Advanced Practice Providers on your designated Care Team: Dawn, [place] . Fabian Sharp, PA-C  Any Other Special Instructions Will Be Listed Below (If Applicable).  Your physician has requested that you regularly monitor your blood pressure at home. Please use the same machine to check your blood pressure for daily. Keep a record of your blood pressures using the log sheet provided. In 3-4 weeks, please report 3-4 readings back to Dr C. You may use our online patient portal  'MyChart' or you can call the office to speak with a nurse.

## 2018-07-22 DIAGNOSIS — I351 Nonrheumatic aortic (valve) insufficiency: Secondary | ICD-10-CM | POA: Diagnosis not present

## 2018-07-22 DIAGNOSIS — K219 Gastro-esophageal reflux disease without esophagitis: Secondary | ICD-10-CM | POA: Diagnosis not present

## 2018-07-22 DIAGNOSIS — I700 Atherosclerosis of aorta: Secondary | ICD-10-CM | POA: Diagnosis not present

## 2018-07-22 DIAGNOSIS — I130 Hypertensive heart and chronic kidney disease with heart failure and stage 1 through stage 4 chronic kidney disease, or unspecified chronic kidney disease: Secondary | ICD-10-CM | POA: Diagnosis not present

## 2018-07-22 DIAGNOSIS — Z9181 History of falling: Secondary | ICD-10-CM | POA: Diagnosis not present

## 2018-07-22 DIAGNOSIS — I951 Orthostatic hypotension: Secondary | ICD-10-CM | POA: Diagnosis not present

## 2018-07-22 DIAGNOSIS — I495 Sick sinus syndrome: Secondary | ICD-10-CM | POA: Diagnosis not present

## 2018-07-22 DIAGNOSIS — Z993 Dependence on wheelchair: Secondary | ICD-10-CM | POA: Diagnosis not present

## 2018-07-22 DIAGNOSIS — N183 Chronic kidney disease, stage 3 (moderate): Secondary | ICD-10-CM | POA: Diagnosis not present

## 2018-07-22 DIAGNOSIS — J439 Emphysema, unspecified: Secondary | ICD-10-CM | POA: Diagnosis not present

## 2018-07-22 DIAGNOSIS — Z950 Presence of cardiac pacemaker: Secondary | ICD-10-CM | POA: Diagnosis not present

## 2018-07-22 DIAGNOSIS — J9611 Chronic respiratory failure with hypoxia: Secondary | ICD-10-CM | POA: Diagnosis not present

## 2018-07-22 DIAGNOSIS — Z8673 Personal history of transient ischemic attack (TIA), and cerebral infarction without residual deficits: Secondary | ICD-10-CM | POA: Diagnosis not present

## 2018-07-22 DIAGNOSIS — Z79891 Long term (current) use of opiate analgesic: Secondary | ICD-10-CM | POA: Diagnosis not present

## 2018-07-22 DIAGNOSIS — M1712 Unilateral primary osteoarthritis, left knee: Secondary | ICD-10-CM | POA: Diagnosis not present

## 2018-07-22 DIAGNOSIS — N200 Calculus of kidney: Secondary | ICD-10-CM | POA: Diagnosis not present

## 2018-07-22 DIAGNOSIS — I4821 Permanent atrial fibrillation: Secondary | ICD-10-CM | POA: Diagnosis not present

## 2018-07-22 DIAGNOSIS — R7303 Prediabetes: Secondary | ICD-10-CM | POA: Diagnosis not present

## 2018-07-22 DIAGNOSIS — I5032 Chronic diastolic (congestive) heart failure: Secondary | ICD-10-CM | POA: Diagnosis not present

## 2018-07-22 DIAGNOSIS — E785 Hyperlipidemia, unspecified: Secondary | ICD-10-CM | POA: Diagnosis not present

## 2018-07-22 NOTE — Progress Notes (Signed)
Cardiology Office Note:    Date:  07/22/2018   ID:  Raven Gray, DOB 02-28-1928, MRN [id number]  PCP:  Janie Morning, DO  Cardiologist:  Sanda Klein, MD ; Quay Burow, M.D.    Referring MD: Janie Morning, DO   Chief Complaint  Patient presents with  . Follow-up    pt denied chest pain   History of Present Illness:    Raven Gray is a 84 y.o. female with a hx of Atrial fibrillation, remote DVT/pulmonary embolism on chronic Xarelto anticoagulation, hypertension, hyperlipidemia, COPD.  She received a pacemaker about a year ago for atrial fibrillation with slow ventricular response, pauses and syncope.  Recently she has required a higher frequency of diuretic dose increase due to edema and volume overload (at least once a week her daughter has given her the 40 mg dose of diuretic).  She is on oxygen 2 L by nasal cannula.  Her daughter reports that she gained 6 pounds just overnight.  She has slept in a recliner for the last couple of nights.  She has 3+ edema.  Echocardiogram and nuclear stress test did not show evidence of left ventricular dysfunction or significant coronary insufficiency roughly 1 year ago.Raven Gray   Has not had any falls, injuries, bleeding.  She is compliant with Xarelto.  She sounds depressed.  Denies angina, palpitation or new focal neurological events.  Has not had cough or hemoptysis or leg tenderness, although she has chronic leg edema bilaterally.  Device interrogation shows normal function.  She has a Medtronic Azure single chamber MRI conditional device that is still essentially at beginning of life with an estimated longevity of over 14 years.  Currently she has 84% ventricular pacing and the heart rate histogram is very blunted.  She is taking metoprolol 25 mg in the morning and 12.5 mg at night (the dose was increased because of elevated blood pressure).  There have been no episodes of high ventricular response.  She is essentially completely sedentary.  There have  been no episodes of nonsustained VT.  Past Medical History:  Diagnosis Date  . Atrial fibrillation ([place])    off AC due to falls  . Chest pain   . CHF (congestive heart failure) ([place])   . Cholecystitis 03/2018  . COPD (chronic obstructive pulmonary disease) ([facility])    wears prn home O2  . Dementia ([place])   . H/O echocardiogram    a. 08/2016: EF of 60-65%, severely dilated LA, mild pulmonic regurgitations, mild TR, and PA Pressure at 38 mm Hg  . History of depression   . History of pulmonary embolism   . Hyperlipidemia   . Hypertension   . Hypokalemia    Now improved with treatment  . Pre-diabetes   . Shortness of breath   . Stroke [facility])    brainstem aneurysm(?) in 2016  . Tachy-brady syndrome ([place]) 04/01/2017   s/p MDT PPM     Past Surgical History:  Procedure Laterality Date  . ABDOMINAL HYSTERECTOMY    . BACK SURGERY    . CHOLECYSTECTOMY N/A 03/29/2018   Procedure: LAPAROSCOPIC CHOLECYSTECTOMY WITH INTRAOPERATIVE CHOLANGIOGRAM;  Surgeon: Alphonsa Overall, MD;  Location: [place];  Service: General;  Laterality: N/A;  . PACEMAKER IMPLANT N/A 04/01/2017   Procedure: Pacemaker Implant;  Surgeon: Sanda Klein, MD;  Location: [place] [facility];  Service: Cardiovascular;  Laterality: N/A; MDT Azure XT SR MRI  . REPLACEMENT TOTAL KNEE    . ROTATOR CUFF REPAIR  Current Medications: Current Meds  Medication Sig  . acetaminophen (TYLENOL) 325 MG tablet Take 650 mg by mouth every 6 (six) hours as needed (for pain).   Raven Gray ALPRAZolam (XANAX) 0.25 MG tablet Take 1 tablet (0.25 mg total) by mouth 3 (three) times daily as needed for anxiety (anxiety). (Patient taking differently: Take 0.25 mg by mouth 3 (three) times daily as needed for anxiety. )  . furosemide (LASIX) 40 MG tablet Take 1 tablet (40 mg total) Tuesdays, Thursdays, Saturdays, and Sundays. Take 0.5 tablet (20 mg total) Mondays, Wednesdays, and Fridays.  Raven Gray ipratropium-albuterol (DUONEB) 0.5-2.5 (3) MG/3ML SOLN USE 1 VIAL VIA  NEBULIZER EVERY 6 HOURS AS NEEDED FOR COUGH WHEEZE OR FOR SHORTNESS OF BREATH  . MAGNESIUM PO Take 1 tablet by mouth at bedtime.   Raven Gray MELATONIN PO Take 1 tablet by mouth at bedtime as needed (sleep).   . metoprolol tartrate (LOPRESSOR) 25 MG tablet Take 0.5 tablets (12.5 mg total) by mouth 2 (two) times daily.  . montelukast (SINGULAIR) 10 MG tablet Take 10 mg by mouth at bedtime.  . Multiple Vitamin (MULTIVITAMIN WITH MINERALS) TABS tablet Take 1 tablet by mouth daily.  . nitroGLYCERIN (NITROSTAT) 0.4 MG SL tablet Place 0.4 mg under the tongue every 5 (five) minutes as needed for chest pain. X 3 doses  . omeprazole (PRILOSEC) 20 MG capsule Take 20 mg by mouth daily as needed (for reflux/heartburn).   . ondansetron (ZOFRAN ODT) 4 MG disintegrating tablet Take 1 tablet (4 mg total) by mouth every 8 (eight) hours as needed for nausea.  . OXYGEN Inhale 2 L into the lungs daily as needed (shortness of breath).   . polyethylene glycol powder (GLYCOLAX/MIRALAX) powder Take 17 g by mouth 2 (two) times daily. Until daily soft stools OTC  . potassium chloride SA (K-DUR,KLOR-CON) 20 MEQ tablet Take 1 tablet (20 mEq total) by mouth daily as needed (along with lasix.). (Patient taking differently: Take 20 mEq by mouth daily. )  . pravastatin (PRAVACHOL) 40 MG tablet Take 40 mg by mouth daily.   . traZODone (DESYREL) 100 MG tablet TAKE 1 TABLET BY MOUTH EVERY DAY AT BEDTIME AS NEEDED  . venlafaxine XR (EFFEXOR-XR) 150 MG 24 hr capsule Take 150 mg by mouth See admin instructions. Take one capsule (150 mg) by mouth with a 75 mg capsule for a total dose of 225 mg daily  . venlafaxine XR (EFFEXOR-XR) 75 MG 24 hr capsule Take 75 mg by mouth See admin instructions. Take one capsule (75 mg) by mouth with a 150 mg capsule for a total dose of 225 mg daily  . [DISCONTINUED] furosemide (LASIX) 40 MG tablet Take 1 tablet(40mg ) two times a day for 3 days.Then 1/2 tablet by mouth daily(20mg ) hold if dry weight is 147lb.  .  [DISCONTINUED] metoprolol tartrate (LOPRESSOR) 25 MG tablet Take 1 tablet (25 mg total) by mouth daily.  . [DISCONTINUED] traZODone (DESYREL) 50 MG tablet Take 50 mg by mouth at bedtime.      Allergies:   Patient has no known allergies.   Social History   Socioeconomic History  . Marital status: Widowed    Spouse name: Not on file  . Number of children: Not on file  . Years of education: Not on file  . Highest education level: Not on file  Occupational History  . Not on file  Social Needs  . Financial resource strain: Not on file  . Food insecurity:    Worry: Not on file  Inability: Not on file  . Transportation needs:    Medical: Not on file    Non-medical: Not on file  Tobacco Use  . Smoking status: Never Smoker  . Smokeless tobacco: Never Used  Substance and Sexual Activity  . Alcohol use: No  . Drug use: No  . Sexual activity: Not Currently    Birth control/protection: None  Lifestyle  . Physical activity:    Days per week: Not on file    Minutes per session: Not on file  . Stress: Not on file  Relationships  . Social connections:    Talks on phone: Not on file    Gets together: Not on file    Attends religious service: Not on file    Active member of club or organization: Not on file    Attends meetings of clubs or organizations: Not on file    Relationship status: Not on file  Other Topics Concern  . Not on file  Social History Narrative  . Not on file     Family History: The patient's family history includes Alzheimer's disease in her mother; Diabetes type II in her daughter; Heart attack in her father; Heart disease in her daughter. ROS:   Please see the history of present illness.     All other systems reviewed and are negative.  EKGs/Labs/Other Studies Reviewed:     EKG:  EKG is not ordered today.   Recent Labs: 03/29/2018: B Natriuretic Peptide 811.5 05/26/2018: ALT 12; BUN 9; Creatinine, Ser 0.92; Hemoglobin 14.4; Magnesium 1.8; Platelets 169;  Potassium 4.0; Sodium 142   Recent Lipid Panel    Component Value Date/Time   CHOL 115 01/09/2017 0356   TRIG 86 01/09/2017 0356   HDL 40 (L) 01/09/2017 0356   CHOLHDL 2.9 01/09/2017 0356   VLDL 17 01/09/2017 0356   LDLCALC 58 01/09/2017 0356    Physical Exam:    VS:  BP (!) 154/75   Pulse (!) 57   Ht 5\' 3"  (1.6 m)   Wt 158 lb 3.2 oz (71.8 kg)   BMI 28.02 kg/m     Wt Readings from Last 3 Encounters:  07/21/18 158 lb 3.2 oz (71.8 kg)  06/08/18 156 lb 6.4 oz (70.9 kg)  06/01/18 151 lb 9.6 oz (68.8 kg)      General: Alert, oriented x3, no distress, appears elderly and frail.  Healthy left subclavian pacemaker site. Head: no evidence of trauma, PERRL, EOMI, no exophtalmos or lid lag, no myxedema, no xanthelasma; normal ears, nose and oropharynx Neck:/8 cm elevation in jugular venous pulsations and prompt hepatojugular reflux; brisk carotid pulses without delay and no carotid bruits Chest: clear to auscultation, no signs of consolidation by percussion or palpation, normal fremitus, symmetrical and full respiratory excursions Cardiovascular: normal position and quality of the apical impulse, regular rhythm, normal first and widely split second heart sounds, no murmurs, rubs or gallops Abdomen: no tenderness or distention, no masses by palpation, no abnormal pulsatility or arterial bruits, normal bowel sounds, no hepatosplenomegaly Extremities: no clubbing, cyanosis; there is no redness or tenderness in her legs but she has 3+ symmetrical edema to the upper calves bilaterally; 2+ radial, ulnar and brachial pulses bilaterally; 2+ right femoral, posterior tibial and dorsalis pedis pulses; 2+ left femoral, posterior tibial and dorsalis pedis pulses; no subclavian or femoral bruits Neurological: grossly nonfocal Psych: She comes across as depressed.    ASSESSMENT:    1. Acute on chronic diastolic CHF (congestive heart failure) ([place])  2. Permanent atrial fibrillation   3. Long term  current use of anticoagulant   4. Essential hypertension   5. History of pulmonary embolism   6. Pacemaker   7. Nonsustained ventricular tachycardia (HCC)    PLAN:    In order of problems listed above:  1.  CHF: She has clinical evidence of volume overload and substantial weight gain.  We will increase the dose of her diuretic.  She will take a full 40 mg dose 4 days a week and the 20 mg dose the other 3 days of the week.  Recheck labs in 3 weeks and will follow up on her blood pressure and her weight at that time. 2. AFib: Excellent, probably excessive rate control.  Cut back the metoprolol to only 12.5 mg twice daily. CHADSVasc at least 5 (age 24, gender, CHF, HTN) 3. HTN: Add amlodipine 2.5 mg once daily 4. Xarelto: Has not had any bleeding on chronic Xarelto anticoagulation. 5. Pulmonary embolism diagnosed in 2013, no recent symptoms to suggest active DVT/PE.  On anticoagulation for this indication as well. 6. PPM: no syncope since device implantation.  7. NSVT: None recorded recently.  I do not think it would be a problem if we cut back on her beta-blocker dose.   Medication Adjustments/Labs and Tests Ordered: Current medicines are reviewed at length with the patient today.  Concerns regarding medicines are outlined above. Labs and tests ordered and medication changes are outlined in the patient instructions below:  Patient Instructions  Medication Instructions:  Dr Sallyanne Kuster has recommended making the following medication changes: 1. TAKE Metoprolol 12.5 mg (0.5 tablet) TWICE daily 2. TAKE Furosemide 40 mg (1 tablet) Tuesdays, Thursdays, Saturdays, and Sundays AND 20 mg (0.5 tablet) Mondays, Wednesdays, and Fridays 3. START Amlodipine 2.5 mg - take 1 tablet daily  If you need a refill on your cardiac medications before your next appointment, please call your pharmacy.   Lab work: Your physician recommends that you return for lab work in 3 weeks.  If you have labs (blood work)  drawn today and your tests are completely normal, you will receive your results only by: Raven Gray MyChart Message (if you have MyChart) OR . A paper copy in the mail If you have any lab test that is abnormal or we need to change your treatment, we will call you to review the results.  Follow-Up: At [facility], you and your health needs are our priority.  As part of our continuing mission to provide you with exceptional heart care, we have created designated Provider Care Teams.  These Care Teams include your primary Cardiologist (physician) and Advanced Practice Providers (APPs -  Physician Assistants and Nurse Practitioners) who all work together to provide you with the care you need, when you need it. You will need a follow up appointment in 12 months.  Please call our office 2 months in advance to schedule this appointment.  You may see Sanda Klein, MD or one of the following Advanced Practice Providers on your designated Care Team: [place], [place] . Fabian Sharp, PA-C  Any Other Special Instructions Will Be Listed Below (If Applicable).  Your physician has requested that you regularly monitor your blood pressure at home. Please use the same machine to check your blood pressure for daily. Keep a record of your blood pressures using the log sheet provided. In 3-4 weeks, please report 3-4 readings back to Dr C. You may use our online patient portal 'MyChart' or you can call  the office to speak with a nurse.     Signed, Sanda Klein, MD  07/22/2018 3:22 PM    [facility]

## 2018-07-24 DIAGNOSIS — J410 Simple chronic bronchitis: Secondary | ICD-10-CM | POA: Diagnosis not present

## 2018-07-26 DIAGNOSIS — N200 Calculus of kidney: Secondary | ICD-10-CM | POA: Diagnosis not present

## 2018-07-26 DIAGNOSIS — I951 Orthostatic hypotension: Secondary | ICD-10-CM | POA: Diagnosis not present

## 2018-07-26 DIAGNOSIS — N183 Chronic kidney disease, stage 3 (moderate): Secondary | ICD-10-CM | POA: Diagnosis not present

## 2018-07-26 DIAGNOSIS — Z79891 Long term (current) use of opiate analgesic: Secondary | ICD-10-CM | POA: Diagnosis not present

## 2018-07-26 DIAGNOSIS — I495 Sick sinus syndrome: Secondary | ICD-10-CM | POA: Diagnosis not present

## 2018-07-26 DIAGNOSIS — J439 Emphysema, unspecified: Secondary | ICD-10-CM | POA: Diagnosis not present

## 2018-07-26 DIAGNOSIS — I5032 Chronic diastolic (congestive) heart failure: Secondary | ICD-10-CM | POA: Diagnosis not present

## 2018-07-26 DIAGNOSIS — R7303 Prediabetes: Secondary | ICD-10-CM | POA: Diagnosis not present

## 2018-07-26 DIAGNOSIS — I130 Hypertensive heart and chronic kidney disease with heart failure and stage 1 through stage 4 chronic kidney disease, or unspecified chronic kidney disease: Secondary | ICD-10-CM | POA: Diagnosis not present

## 2018-07-26 DIAGNOSIS — Z8673 Personal history of transient ischemic attack (TIA), and cerebral infarction without residual deficits: Secondary | ICD-10-CM | POA: Diagnosis not present

## 2018-07-26 DIAGNOSIS — I4821 Permanent atrial fibrillation: Secondary | ICD-10-CM | POA: Diagnosis not present

## 2018-07-26 DIAGNOSIS — Z950 Presence of cardiac pacemaker: Secondary | ICD-10-CM | POA: Diagnosis not present

## 2018-07-26 DIAGNOSIS — I700 Atherosclerosis of aorta: Secondary | ICD-10-CM | POA: Diagnosis not present

## 2018-07-26 DIAGNOSIS — K219 Gastro-esophageal reflux disease without esophagitis: Secondary | ICD-10-CM | POA: Diagnosis not present

## 2018-07-26 DIAGNOSIS — I351 Nonrheumatic aortic (valve) insufficiency: Secondary | ICD-10-CM | POA: Diagnosis not present

## 2018-07-26 DIAGNOSIS — Z9181 History of falling: Secondary | ICD-10-CM | POA: Diagnosis not present

## 2018-07-26 DIAGNOSIS — M1712 Unilateral primary osteoarthritis, left knee: Secondary | ICD-10-CM | POA: Diagnosis not present

## 2018-07-26 DIAGNOSIS — E785 Hyperlipidemia, unspecified: Secondary | ICD-10-CM | POA: Diagnosis not present

## 2018-07-26 DIAGNOSIS — Z993 Dependence on wheelchair: Secondary | ICD-10-CM | POA: Diagnosis not present

## 2018-07-26 DIAGNOSIS — J9611 Chronic respiratory failure with hypoxia: Secondary | ICD-10-CM | POA: Diagnosis not present

## 2018-07-27 DIAGNOSIS — I495 Sick sinus syndrome: Secondary | ICD-10-CM | POA: Diagnosis not present

## 2018-07-27 DIAGNOSIS — K219 Gastro-esophageal reflux disease without esophagitis: Secondary | ICD-10-CM | POA: Diagnosis not present

## 2018-07-27 DIAGNOSIS — I951 Orthostatic hypotension: Secondary | ICD-10-CM | POA: Diagnosis not present

## 2018-07-27 DIAGNOSIS — Z9181 History of falling: Secondary | ICD-10-CM | POA: Diagnosis not present

## 2018-07-27 DIAGNOSIS — M1712 Unilateral primary osteoarthritis, left knee: Secondary | ICD-10-CM | POA: Diagnosis not present

## 2018-07-27 DIAGNOSIS — I700 Atherosclerosis of aorta: Secondary | ICD-10-CM | POA: Diagnosis not present

## 2018-07-27 DIAGNOSIS — J9611 Chronic respiratory failure with hypoxia: Secondary | ICD-10-CM | POA: Diagnosis not present

## 2018-07-27 DIAGNOSIS — Z8673 Personal history of transient ischemic attack (TIA), and cerebral infarction without residual deficits: Secondary | ICD-10-CM | POA: Diagnosis not present

## 2018-07-27 DIAGNOSIS — N183 Chronic kidney disease, stage 3 (moderate): Secondary | ICD-10-CM | POA: Diagnosis not present

## 2018-07-27 DIAGNOSIS — I4821 Permanent atrial fibrillation: Secondary | ICD-10-CM | POA: Diagnosis not present

## 2018-07-27 DIAGNOSIS — E785 Hyperlipidemia, unspecified: Secondary | ICD-10-CM | POA: Diagnosis not present

## 2018-07-27 DIAGNOSIS — Z993 Dependence on wheelchair: Secondary | ICD-10-CM | POA: Diagnosis not present

## 2018-07-27 DIAGNOSIS — I5032 Chronic diastolic (congestive) heart failure: Secondary | ICD-10-CM | POA: Diagnosis not present

## 2018-07-27 DIAGNOSIS — R7303 Prediabetes: Secondary | ICD-10-CM | POA: Diagnosis not present

## 2018-07-27 DIAGNOSIS — N200 Calculus of kidney: Secondary | ICD-10-CM | POA: Diagnosis not present

## 2018-07-27 DIAGNOSIS — Z950 Presence of cardiac pacemaker: Secondary | ICD-10-CM | POA: Diagnosis not present

## 2018-07-27 DIAGNOSIS — J439 Emphysema, unspecified: Secondary | ICD-10-CM | POA: Diagnosis not present

## 2018-07-27 DIAGNOSIS — I351 Nonrheumatic aortic (valve) insufficiency: Secondary | ICD-10-CM | POA: Diagnosis not present

## 2018-07-27 DIAGNOSIS — Z79891 Long term (current) use of opiate analgesic: Secondary | ICD-10-CM | POA: Diagnosis not present

## 2018-07-27 DIAGNOSIS — I130 Hypertensive heart and chronic kidney disease with heart failure and stage 1 through stage 4 chronic kidney disease, or unspecified chronic kidney disease: Secondary | ICD-10-CM | POA: Diagnosis not present

## 2018-07-28 DIAGNOSIS — Z79891 Long term (current) use of opiate analgesic: Secondary | ICD-10-CM | POA: Diagnosis not present

## 2018-07-28 DIAGNOSIS — N183 Chronic kidney disease, stage 3 (moderate): Secondary | ICD-10-CM | POA: Diagnosis not present

## 2018-07-28 DIAGNOSIS — K219 Gastro-esophageal reflux disease without esophagitis: Secondary | ICD-10-CM | POA: Diagnosis not present

## 2018-07-28 DIAGNOSIS — I495 Sick sinus syndrome: Secondary | ICD-10-CM | POA: Diagnosis not present

## 2018-07-28 DIAGNOSIS — I4821 Permanent atrial fibrillation: Secondary | ICD-10-CM | POA: Diagnosis not present

## 2018-07-28 DIAGNOSIS — J9611 Chronic respiratory failure with hypoxia: Secondary | ICD-10-CM | POA: Diagnosis not present

## 2018-07-28 DIAGNOSIS — N200 Calculus of kidney: Secondary | ICD-10-CM | POA: Diagnosis not present

## 2018-07-28 DIAGNOSIS — E785 Hyperlipidemia, unspecified: Secondary | ICD-10-CM | POA: Diagnosis not present

## 2018-07-28 DIAGNOSIS — J439 Emphysema, unspecified: Secondary | ICD-10-CM | POA: Diagnosis not present

## 2018-07-28 DIAGNOSIS — Z993 Dependence on wheelchair: Secondary | ICD-10-CM | POA: Diagnosis not present

## 2018-07-28 DIAGNOSIS — M1712 Unilateral primary osteoarthritis, left knee: Secondary | ICD-10-CM | POA: Diagnosis not present

## 2018-07-28 DIAGNOSIS — I351 Nonrheumatic aortic (valve) insufficiency: Secondary | ICD-10-CM | POA: Diagnosis not present

## 2018-07-28 DIAGNOSIS — Z8673 Personal history of transient ischemic attack (TIA), and cerebral infarction without residual deficits: Secondary | ICD-10-CM | POA: Diagnosis not present

## 2018-07-28 DIAGNOSIS — I5032 Chronic diastolic (congestive) heart failure: Secondary | ICD-10-CM | POA: Diagnosis not present

## 2018-07-28 DIAGNOSIS — I700 Atherosclerosis of aorta: Secondary | ICD-10-CM | POA: Diagnosis not present

## 2018-07-28 DIAGNOSIS — Z950 Presence of cardiac pacemaker: Secondary | ICD-10-CM | POA: Diagnosis not present

## 2018-07-28 DIAGNOSIS — I130 Hypertensive heart and chronic kidney disease with heart failure and stage 1 through stage 4 chronic kidney disease, or unspecified chronic kidney disease: Secondary | ICD-10-CM | POA: Diagnosis not present

## 2018-07-28 DIAGNOSIS — Z9181 History of falling: Secondary | ICD-10-CM | POA: Diagnosis not present

## 2018-07-28 DIAGNOSIS — R7303 Prediabetes: Secondary | ICD-10-CM | POA: Diagnosis not present

## 2018-07-28 DIAGNOSIS — I951 Orthostatic hypotension: Secondary | ICD-10-CM | POA: Diagnosis not present

## 2018-07-30 DIAGNOSIS — I5032 Chronic diastolic (congestive) heart failure: Secondary | ICD-10-CM | POA: Diagnosis not present

## 2018-07-30 DIAGNOSIS — I130 Hypertensive heart and chronic kidney disease with heart failure and stage 1 through stage 4 chronic kidney disease, or unspecified chronic kidney disease: Secondary | ICD-10-CM | POA: Diagnosis not present

## 2018-07-30 DIAGNOSIS — N183 Chronic kidney disease, stage 3 (moderate): Secondary | ICD-10-CM | POA: Diagnosis not present

## 2018-07-30 DIAGNOSIS — I4821 Permanent atrial fibrillation: Secondary | ICD-10-CM | POA: Diagnosis not present

## 2018-07-30 DIAGNOSIS — I351 Nonrheumatic aortic (valve) insufficiency: Secondary | ICD-10-CM | POA: Diagnosis not present

## 2018-07-30 DIAGNOSIS — E785 Hyperlipidemia, unspecified: Secondary | ICD-10-CM | POA: Diagnosis not present

## 2018-07-30 DIAGNOSIS — Z950 Presence of cardiac pacemaker: Secondary | ICD-10-CM | POA: Diagnosis not present

## 2018-07-30 DIAGNOSIS — Z9181 History of falling: Secondary | ICD-10-CM | POA: Diagnosis not present

## 2018-07-30 DIAGNOSIS — M1712 Unilateral primary osteoarthritis, left knee: Secondary | ICD-10-CM | POA: Diagnosis not present

## 2018-07-30 DIAGNOSIS — I700 Atherosclerosis of aorta: Secondary | ICD-10-CM | POA: Diagnosis not present

## 2018-07-30 DIAGNOSIS — R7303 Prediabetes: Secondary | ICD-10-CM | POA: Diagnosis not present

## 2018-07-30 DIAGNOSIS — J9611 Chronic respiratory failure with hypoxia: Secondary | ICD-10-CM | POA: Diagnosis not present

## 2018-07-30 DIAGNOSIS — I495 Sick sinus syndrome: Secondary | ICD-10-CM | POA: Diagnosis not present

## 2018-07-30 DIAGNOSIS — I951 Orthostatic hypotension: Secondary | ICD-10-CM | POA: Diagnosis not present

## 2018-07-30 DIAGNOSIS — J439 Emphysema, unspecified: Secondary | ICD-10-CM | POA: Diagnosis not present

## 2018-07-30 DIAGNOSIS — N200 Calculus of kidney: Secondary | ICD-10-CM | POA: Diagnosis not present

## 2018-07-30 DIAGNOSIS — J449 Chronic obstructive pulmonary disease, unspecified: Secondary | ICD-10-CM | POA: Diagnosis not present

## 2018-07-30 DIAGNOSIS — Z79891 Long term (current) use of opiate analgesic: Secondary | ICD-10-CM | POA: Diagnosis not present

## 2018-07-30 DIAGNOSIS — K219 Gastro-esophageal reflux disease without esophagitis: Secondary | ICD-10-CM | POA: Diagnosis not present

## 2018-07-30 DIAGNOSIS — Z993 Dependence on wheelchair: Secondary | ICD-10-CM | POA: Diagnosis not present

## 2018-07-30 DIAGNOSIS — Z8673 Personal history of transient ischemic attack (TIA), and cerebral infarction without residual deficits: Secondary | ICD-10-CM | POA: Diagnosis not present

## 2018-08-03 ENCOUNTER — Emergency Department (HOSPITAL_COMMUNITY): Payer: Medicare Other

## 2018-08-03 ENCOUNTER — Inpatient Hospital Stay (HOSPITAL_COMMUNITY)
Admission: EM | Admit: 2018-08-03 | Discharge: 2018-08-06 | DRG: 064 | Disposition: A | Discharge status: Skilled Nursing Facility | Payer: Medicare Other | Attending: Internal Medicine | Admitting: Internal Medicine

## 2018-08-03 ENCOUNTER — Encounter (HOSPITAL_COMMUNITY): Payer: Self-pay | Admitting: Emergency Medicine

## 2018-08-03 DIAGNOSIS — R2990 Unspecified symptoms and signs involving the nervous system: Secondary | ICD-10-CM | POA: Diagnosis not present

## 2018-08-03 DIAGNOSIS — R531 Weakness: Secondary | ICD-10-CM | POA: Diagnosis not present

## 2018-08-03 DIAGNOSIS — I361 Nonrheumatic tricuspid (valve) insufficiency: Secondary | ICD-10-CM | POA: Diagnosis not present

## 2018-08-03 DIAGNOSIS — I5032 Chronic diastolic (congestive) heart failure: Secondary | ICD-10-CM | POA: Diagnosis present

## 2018-08-03 DIAGNOSIS — K449 Diaphragmatic hernia without obstruction or gangrene: Secondary | ICD-10-CM | POA: Diagnosis present

## 2018-08-03 DIAGNOSIS — J439 Emphysema, unspecified: Secondary | ICD-10-CM | POA: Diagnosis present

## 2018-08-03 DIAGNOSIS — Z66 Do not resuscitate: Secondary | ICD-10-CM | POA: Diagnosis present

## 2018-08-03 DIAGNOSIS — R29700 NIHSS score 0: Secondary | ICD-10-CM | POA: Diagnosis not present

## 2018-08-03 DIAGNOSIS — Z9049 Acquired absence of other specified parts of digestive tract: Secondary | ICD-10-CM

## 2018-08-03 DIAGNOSIS — R4781 Slurred speech: Secondary | ICD-10-CM | POA: Diagnosis present

## 2018-08-03 DIAGNOSIS — Z950 Presence of cardiac pacemaker: Secondary | ICD-10-CM | POA: Diagnosis not present

## 2018-08-03 DIAGNOSIS — K219 Gastro-esophageal reflux disease without esophagitis: Secondary | ICD-10-CM | POA: Diagnosis present

## 2018-08-03 DIAGNOSIS — I509 Heart failure, unspecified: Secondary | ICD-10-CM | POA: Diagnosis not present

## 2018-08-03 DIAGNOSIS — I639 Cerebral infarction, unspecified: Secondary | ICD-10-CM

## 2018-08-03 DIAGNOSIS — E785 Hyperlipidemia, unspecified: Secondary | ICD-10-CM | POA: Diagnosis not present

## 2018-08-03 DIAGNOSIS — C23 Malignant neoplasm of gallbladder: Secondary | ICD-10-CM | POA: Diagnosis present

## 2018-08-03 DIAGNOSIS — I6381 Other cerebral infarction due to occlusion or stenosis of small artery: Principal | ICD-10-CM | POA: Diagnosis present

## 2018-08-03 DIAGNOSIS — I10 Essential (primary) hypertension: Secondary | ICD-10-CM | POA: Diagnosis not present

## 2018-08-03 DIAGNOSIS — E876 Hypokalemia: Secondary | ICD-10-CM | POA: Diagnosis present

## 2018-08-03 DIAGNOSIS — J449 Chronic obstructive pulmonary disease, unspecified: Secondary | ICD-10-CM | POA: Diagnosis not present

## 2018-08-03 DIAGNOSIS — I351 Nonrheumatic aortic (valve) insufficiency: Secondary | ICD-10-CM | POA: Diagnosis not present

## 2018-08-03 DIAGNOSIS — R7303 Prediabetes: Secondary | ICD-10-CM | POA: Diagnosis present

## 2018-08-03 DIAGNOSIS — N183 Chronic kidney disease, stage 3 (moderate): Secondary | ICD-10-CM | POA: Diagnosis present

## 2018-08-03 DIAGNOSIS — I495 Sick sinus syndrome: Secondary | ICD-10-CM | POA: Diagnosis present

## 2018-08-03 DIAGNOSIS — G9341 Metabolic encephalopathy: Secondary | ICD-10-CM | POA: Diagnosis not present

## 2018-08-03 DIAGNOSIS — G9389 Other specified disorders of brain: Secondary | ICD-10-CM | POA: Diagnosis not present

## 2018-08-03 DIAGNOSIS — J9611 Chronic respiratory failure with hypoxia: Secondary | ICD-10-CM | POA: Diagnosis present

## 2018-08-03 DIAGNOSIS — F419 Anxiety disorder, unspecified: Secondary | ICD-10-CM | POA: Diagnosis present

## 2018-08-03 DIAGNOSIS — Z8673 Personal history of transient ischemic attack (TIA), and cerebral infarction without residual deficits: Secondary | ICD-10-CM

## 2018-08-03 DIAGNOSIS — F0390 Unspecified dementia without behavioral disturbance: Secondary | ICD-10-CM | POA: Diagnosis present

## 2018-08-03 DIAGNOSIS — I130 Hypertensive heart and chronic kidney disease with heart failure and stage 1 through stage 4 chronic kidney disease, or unspecified chronic kidney disease: Secondary | ICD-10-CM | POA: Diagnosis not present

## 2018-08-03 DIAGNOSIS — F329 Major depressive disorder, single episode, unspecified: Secondary | ICD-10-CM | POA: Diagnosis present

## 2018-08-03 DIAGNOSIS — R0902 Hypoxemia: Secondary | ICD-10-CM | POA: Diagnosis not present

## 2018-08-03 DIAGNOSIS — Z86711 Personal history of pulmonary embolism: Secondary | ICD-10-CM

## 2018-08-03 DIAGNOSIS — D696 Thrombocytopenia, unspecified: Secondary | ICD-10-CM | POA: Diagnosis present

## 2018-08-03 DIAGNOSIS — Z6828 Body mass index (BMI) 28.0-28.9, adult: Secondary | ICD-10-CM

## 2018-08-03 DIAGNOSIS — Z90710 Acquired absence of both cervix and uterus: Secondary | ICD-10-CM

## 2018-08-03 DIAGNOSIS — Z8509 Personal history of malignant neoplasm of other digestive organs: Secondary | ICD-10-CM

## 2018-08-03 DIAGNOSIS — G92 Toxic encephalopathy: Secondary | ICD-10-CM | POA: Diagnosis not present

## 2018-08-03 DIAGNOSIS — R29818 Other symptoms and signs involving the nervous system: Secondary | ICD-10-CM | POA: Diagnosis not present

## 2018-08-03 DIAGNOSIS — G8194 Hemiplegia, unspecified affecting left nondominant side: Secondary | ICD-10-CM | POA: Diagnosis not present

## 2018-08-03 DIAGNOSIS — I6300 Cerebral infarction due to thrombosis of unspecified precerebral artery: Secondary | ICD-10-CM | POA: Diagnosis not present

## 2018-08-03 DIAGNOSIS — M549 Dorsalgia, unspecified: Secondary | ICD-10-CM | POA: Diagnosis present

## 2018-08-03 DIAGNOSIS — Z743 Need for continuous supervision: Secondary | ICD-10-CM | POA: Diagnosis not present

## 2018-08-03 DIAGNOSIS — R000 Tachycardia, unspecified: Secondary | ICD-10-CM | POA: Diagnosis not present

## 2018-08-03 DIAGNOSIS — I6523 Occlusion and stenosis of bilateral carotid arteries: Secondary | ICD-10-CM | POA: Diagnosis not present

## 2018-08-03 DIAGNOSIS — R414 Neurologic neglect syndrome: Secondary | ICD-10-CM | POA: Diagnosis present

## 2018-08-03 DIAGNOSIS — Z833 Family history of diabetes mellitus: Secondary | ICD-10-CM

## 2018-08-03 DIAGNOSIS — Z8249 Family history of ischemic heart disease and other diseases of the circulatory system: Secondary | ICD-10-CM

## 2018-08-03 DIAGNOSIS — I482 Chronic atrial fibrillation, unspecified: Secondary | ICD-10-CM | POA: Diagnosis not present

## 2018-08-03 DIAGNOSIS — Z9981 Dependence on supplemental oxygen: Secondary | ICD-10-CM

## 2018-08-03 DIAGNOSIS — E663 Overweight: Secondary | ICD-10-CM | POA: Diagnosis present

## 2018-08-03 DIAGNOSIS — R296 Repeated falls: Secondary | ICD-10-CM | POA: Diagnosis present

## 2018-08-03 LAB — URINALYSIS, ROUTINE W REFLEX MICROSCOPIC
BILIRUBIN URINE: NEGATIVE
Glucose, UA: NEGATIVE mg/dL
HGB URINE DIPSTICK: NEGATIVE
Ketones, ur: NEGATIVE mg/dL
Leukocytes, UA: NEGATIVE
NITRITE: NEGATIVE
PROTEIN: NEGATIVE mg/dL
SPECIFIC GRAVITY, URINE: 1.01 (ref 1.005–1.030)
pH: 6 (ref 5.0–8.0)

## 2018-08-03 LAB — COMPREHENSIVE METABOLIC PANEL
ALBUMIN: 3.4 g/dL — AB (ref 3.5–5.0)
ALT: 13 U/L (ref 0–44)
ANION GAP: 7 (ref 5–15)
AST: 28 U/L (ref 15–41)
Alkaline Phosphatase: 84 U/L (ref 38–126)
BUN: 11 mg/dL (ref 8–23)
CHLORIDE: 101 mmol/L (ref 98–111)
CO2: 32 mmol/L (ref 22–32)
Calcium: 8.9 mg/dL (ref 8.9–10.3)
Creatinine, Ser: 1.07 mg/dL — ABNORMAL HIGH (ref 0.44–1.00)
GFR calc Af Amer: 52 mL/min — ABNORMAL LOW (ref >60)
GFR calc non Af Amer: 45 mL/min — ABNORMAL LOW (ref >60)
GLUCOSE: 104 mg/dL — AB (ref 70–99)
POTASSIUM: 3.9 mmol/L (ref 3.5–5.1)
Sodium: 140 mmol/L (ref 135–145)
Total Bilirubin: 0.9 mg/dL (ref 0.3–1.2)
Total Protein: 6.7 g/dL (ref 6.5–8.1)

## 2018-08-03 LAB — I-STAT CHEM 8, ED
BUN: 14 mg/dL (ref 8–23)
CHLORIDE: 98 mmol/L (ref 98–111)
Calcium, Ion: 1.15 mmol/L (ref 1.15–1.40)
Creatinine, Ser: 1 mg/dL (ref 0.44–1.00)
Glucose, Bld: 101 mg/dL — ABNORMAL HIGH (ref 70–99)
HCT: 45 % (ref 36.0–46.0)
HEMOGLOBIN: 15.3 g/dL — AB (ref 12.0–15.0)
POTASSIUM: 3.9 mmol/L (ref 3.5–5.1)
SODIUM: 140 mmol/L (ref 135–145)
TCO2: 34 mmol/L — ABNORMAL HIGH (ref 22–32)

## 2018-08-03 LAB — CBC WITH DIFFERENTIAL/PLATELET
ABS IMMATURE GRANULOCYTES: 0.01 10*3/uL (ref 0.00–0.07)
BASOS ABS: 0 10*3/uL (ref 0.0–0.1)
BASOS PCT: 1 %
EOS PCT: 3 %
Eosinophils Absolute: 0.1 10*3/uL (ref 0.0–0.5)
HCT: 47.1 % — ABNORMAL HIGH (ref 36.0–46.0)
HEMOGLOBIN: 14.2 g/dL (ref 12.0–15.0)
Immature Granulocytes: 0 %
LYMPHS PCT: 34 %
Lymphs Abs: 1.8 10*3/uL (ref 0.7–4.0)
MCH: 28.2 pg (ref 26.0–34.0)
MCHC: 30.1 g/dL (ref 30.0–36.0)
MCV: 93.6 fL (ref 80.0–100.0)
Monocytes Absolute: 0.3 10*3/uL (ref 0.1–1.0)
Monocytes Relative: 6 %
NEUTROS ABS: 2.9 10*3/uL (ref 1.7–7.7)
NRBC: 0 % (ref 0.0–0.2)
Neutrophils Relative %: 56 %
PLATELETS: 42 10*3/uL — AB (ref 150–400)
RBC: 5.03 MIL/uL (ref 3.87–5.11)
RDW: 14.6 % (ref 11.5–15.5)
WBC: 5.2 10*3/uL (ref 4.0–10.5)

## 2018-08-03 LAB — APTT: APTT: 31 s (ref 24–36)

## 2018-08-03 LAB — I-STAT TROPONIN, ED: Troponin i, poc: 0.02 ng/mL (ref 0.00–0.08)

## 2018-08-03 LAB — BRAIN NATRIURETIC PEPTIDE: B NATRIURETIC PEPTIDE 5: 635.6 pg/mL — AB (ref 0.0–100.0)

## 2018-08-03 LAB — CBG MONITORING, ED: GLUCOSE-CAPILLARY: 91 mg/dL (ref 70–99)

## 2018-08-03 LAB — PROTIME-INR
INR: 1.1
PROTHROMBIN TIME: 14.1 s (ref 11.4–15.2)

## 2018-08-03 MED ORDER — MELATONIN 3 MG PO TABS
3.0000 mg | ORAL_TABLET | Freq: Every day | ORAL | Status: DC
  Administered 2018-08-04 – 2018-08-05 (×3): 3 mg via ORAL
  Filled 2018-08-03 (×4): qty 1

## 2018-08-03 MED ORDER — FUROSEMIDE 20 MG PO TABS
20.0000 mg | ORAL_TABLET | ORAL | Status: DC
  Administered 2018-08-06: 20 mg via ORAL
  Filled 2018-08-03: qty 1

## 2018-08-03 MED ORDER — STROKE: EARLY STAGES OF RECOVERY BOOK
Freq: Once | Status: DC
  Filled 2018-08-03 (×2): qty 1

## 2018-08-03 MED ORDER — PANTOPRAZOLE SODIUM 40 MG PO TBEC
40.0000 mg | DELAYED_RELEASE_TABLET | Freq: Every day | ORAL | Status: DC
  Administered 2018-08-05 – 2018-08-06 (×2): 40 mg via ORAL
  Filled 2018-08-03 (×2): qty 1

## 2018-08-03 MED ORDER — HYDROXYZINE HCL 10 MG PO TABS
10.0000 mg | ORAL_TABLET | Freq: Three times a day (TID) | ORAL | Status: DC | PRN
  Filled 2018-08-03: qty 1

## 2018-08-03 MED ORDER — PRAVASTATIN SODIUM 40 MG PO TABS
40.0000 mg | ORAL_TABLET | Freq: Every day | ORAL | Status: DC
  Administered 2018-08-05 – 2018-08-06 (×2): 40 mg via ORAL
  Filled 2018-08-03 (×2): qty 1

## 2018-08-03 MED ORDER — VENLAFAXINE HCL ER 75 MG PO CP24: 75.0000 mg | ORAL_CAPSULE | ORAL | Status: DC

## 2018-08-03 MED ORDER — ALPRAZOLAM 0.25 MG PO TABS
0.2500 mg | ORAL_TABLET | Freq: Three times a day (TID) | ORAL | Status: DC | PRN
  Administered 2018-08-03 – 2018-08-06 (×7): 0.25 mg via ORAL
  Filled 2018-08-03 (×7): qty 1

## 2018-08-03 MED ORDER — IOPAMIDOL (ISOVUE-370) INJECTION 76%
100.0000 mL | Freq: Once | INTRAVENOUS | Status: AC | PRN
  Administered 2018-08-03: 100 mL via INTRAVENOUS

## 2018-08-03 MED ORDER — VENLAFAXINE HCL ER 75 MG PO CP24
225.0000 mg | ORAL_CAPSULE | Freq: Every day | ORAL | Status: DC
  Administered 2018-08-04 – 2018-08-06 (×3): 225 mg via ORAL
  Filled 2018-08-03 (×3): qty 3

## 2018-08-03 MED ORDER — VENLAFAXINE HCL ER 150 MG PO CP24: 150.0000 mg | ORAL_CAPSULE | Freq: Every day | ORAL | Status: DC

## 2018-08-03 MED ORDER — ZOLPIDEM TARTRATE 5 MG PO TABS
5.0000 mg | ORAL_TABLET | Freq: Every evening | ORAL | Status: DC | PRN
  Administered 2018-08-05: 5 mg via ORAL
  Filled 2018-08-03: qty 1

## 2018-08-03 MED ORDER — MONTELUKAST SODIUM 10 MG PO TABS
10.0000 mg | ORAL_TABLET | Freq: Every day | ORAL | Status: DC
  Administered 2018-08-04 – 2018-08-05 (×4): 10 mg via ORAL
  Filled 2018-08-03 (×5): qty 1

## 2018-08-03 MED ORDER — IPRATROPIUM-ALBUTEROL 0.5-2.5 (3) MG/3ML IN SOLN
3.0000 mL | Freq: Four times a day (QID) | RESPIRATORY_TRACT | Status: DC
  Administered 2018-08-04 – 2018-08-06 (×9): 3 mL via RESPIRATORY_TRACT
  Filled 2018-08-03 (×9): qty 3

## 2018-08-03 MED ORDER — POLYETHYLENE GLYCOL 3350 17 G PO PACK: 17.0000 g | PACK | Freq: Two times a day (BID) | ORAL | Status: DC | PRN

## 2018-08-03 MED ORDER — HYDRALAZINE HCL 20 MG/ML IJ SOLN: 5.0000 mg | INTRAMUSCULAR | Status: DC | PRN

## 2018-08-03 MED ORDER — MAGNESIUM OXIDE 400 (241.3 MG) MG PO TABS
400.0000 mg | ORAL_TABLET | Freq: Every day | ORAL | Status: DC
  Administered 2018-08-04 – 2018-08-05 (×3): 400 mg via ORAL
  Filled 2018-08-03 (×3): qty 1

## 2018-08-03 MED ORDER — TRAZODONE HCL 100 MG PO TABS
100.0000 mg | ORAL_TABLET | Freq: Every day | ORAL | Status: DC
  Administered 2018-08-04 – 2018-08-05 (×3): 100 mg via ORAL
  Filled 2018-08-03 (×3): qty 1

## 2018-08-03 MED ORDER — FUROSEMIDE 40 MG PO TABS
40.0000 mg | ORAL_TABLET | ORAL | Status: DC
  Administered 2018-08-05: 40 mg via ORAL
  Filled 2018-08-03: qty 1

## 2018-08-03 MED ORDER — ACETAMINOPHEN 325 MG PO TABS
650.0000 mg | ORAL_TABLET | Freq: Four times a day (QID) | ORAL | Status: DC | PRN
  Administered 2018-08-04 – 2018-08-06 (×3): 650 mg via ORAL
  Filled 2018-08-03 (×3): qty 2

## 2018-08-03 MED ORDER — ALBUTEROL SULFATE (2.5 MG/3ML) 0.083% IN NEBU: 2.5000 mg | INHALATION_SOLUTION | RESPIRATORY_TRACT | Status: DC | PRN

## 2018-08-03 MED ORDER — NITROGLYCERIN 0.4 MG SL SUBL: 0.4000 mg | SUBLINGUAL_TABLET | SUBLINGUAL | Status: DC | PRN

## 2018-08-03 MED ORDER — DM-GUAIFENESIN ER 30-600 MG PO TB12
1.0000 | ORAL_TABLET | Freq: Two times a day (BID) | ORAL | Status: DC | PRN
  Filled 2018-08-03: qty 1

## 2018-08-03 MED ORDER — FUROSEMIDE 20 MG PO TABS: 20.0000 mg | ORAL_TABLET | ORAL | Status: DC

## 2018-08-03 MED ORDER — METOPROLOL TARTRATE 12.5 MG HALF TABLET
12.5000 mg | ORAL_TABLET | Freq: Two times a day (BID) | ORAL | Status: DC
  Administered 2018-08-04 – 2018-08-06 (×5): 12.5 mg via ORAL
  Filled 2018-08-03 (×5): qty 1

## 2018-08-03 NOTE — Consult Note (Signed)
Neurology Consultation  Reason for Consult: Bilateral leg weakness-resolved, slurred speech-resolved Referring Physician: Dr. Dayna Barker  CC: Leg weakness, slurred speech-all resolved  History is obtained from: Chart, patient  HPI: Raven Gray is a 84 y.o. female past medical history of atrial fibrillation who used to be on anticoagulation with a newer anticoagulant but was taken off due to falls, CHF, COPD on home oxygen 2 L, dementia, history of depression, history of stroke, prediabetes, was in usual state of health this afternoon with last known normal 3:45 PM when she had sudden onset of bilateral leg weakness and her speech sounded slurred to the family. There were no other symptoms.  No visual symptoms no headaches that she reports. No focal weakness. No focal tingling or numbness.  She was weak and numb in both her legs. She reports feeling dizzy when she gets up in the morning every day. Her symptoms completely resolved by 4 PM-within 15 minutes. No recurrence of symptoms since then. EMS brought her in because of her history of atrial fibrillation and strokes as a code stroke.   LKW: 3:45 PM tpa given?: no, symptoms resolved NIH 0 Premorbid modified Rankin scale (mRS): 2 NIH stroke scale-0  ROS:ROS was performed and is negative except as noted in the HPI.   Past Medical History:  Diagnosis Date  . Atrial fibrillation ([place])    off AC due to falls  . Chest pain   . CHF (congestive heart failure) ([place])   . Cholecystitis 03/2018  . COPD (chronic obstructive pulmonary disease) ([facility])    wears prn home O2  . Dementia ([place])   . H/O echocardiogram    a. 08/2016: EF of 60-65%, severely dilated LA, mild pulmonic regurgitations, mild TR, and PA Pressure at 38 mm Hg  . History of depression   . History of pulmonary embolism   . Hyperlipidemia   . Hypertension   . Hypokalemia    Now improved with treatment  . Pre-diabetes   . Shortness of breath   . Stroke [facility])    brainstem  aneurysm(?) in 2016  . Tachy-brady syndrome ([place]) 04/01/2017   s/p MDT PPM     Family History  Problem Relation Age of Onset  . Alzheimer's disease Mother   . Heart attack Father   . Diabetes type II Daughter   . Heart disease Daughter        stents   Social History:   reports that she has never smoked. She has never used smokeless tobacco. She reports that she does not drink alcohol or use drugs.  Medications No current facility-administered medications for this encounter.   Current Outpatient Medications:  .  acetaminophen (TYLENOL) 325 MG tablet, Take 650 mg by mouth every 6 (six) hours as needed (for pain). , Disp: , Rfl:  .  ALPRAZolam (XANAX) 0.25 MG tablet, Take 1 tablet (0.25 mg total) by mouth 3 (three) times daily as needed for anxiety (anxiety). (Patient taking differently: Take 0.25 mg by mouth 3 (three) times daily as needed for anxiety. ), Disp: 5 tablet, Rfl: 0 .  amLODipine (NORVASC) 2.5 MG tablet, Take 1 tablet (2.5 mg total) by mouth daily., Disp: 90 tablet, Rfl: 3 .  furosemide (LASIX) 40 MG tablet, Take 1 tablet (40 mg total) Tuesdays, Thursdays, Saturdays, and Sundays. Take 0.5 tablet (20 mg total) Mondays, Wednesdays, and Fridays., Disp: 90 tablet, Rfl: 3 .  ipratropium-albuterol (DUONEB) 0.5-2.5 (3) MG/3ML SOLN, USE 1 VIAL VIA NEBULIZER EVERY 6 HOURS AS NEEDED FOR  COUGH WHEEZE OR FOR SHORTNESS OF BREATH, Disp: , Rfl: 1 .  MAGNESIUM PO, Take 1 tablet by mouth at bedtime. , Disp: , Rfl:  .  MELATONIN PO, Take 1 tablet by mouth at bedtime as needed (sleep). , Disp: , Rfl:  .  metoprolol tartrate (LOPRESSOR) 25 MG tablet, Take 0.5 tablets (12.5 mg total) by mouth 2 (two) times daily., Disp: 90 tablet, Rfl: 3 .  montelukast (SINGULAIR) 10 MG tablet, Take 10 mg by mouth at bedtime., Disp: , Rfl:  .  Multiple Vitamin (MULTIVITAMIN WITH MINERALS) TABS tablet, Take 1 tablet by mouth daily., Disp: , Rfl:  .  nitroGLYCERIN (NITROSTAT) 0.4 MG SL tablet, Place 0.4 mg under the  tongue every 5 (five) minutes as needed for chest pain. X 3 doses, Disp: , Rfl: 0 .  omeprazole (PRILOSEC) 20 MG capsule, Take 20 mg by mouth daily as needed (for reflux/heartburn). , Disp: , Rfl: 11 .  ondansetron (ZOFRAN ODT) 4 MG disintegrating tablet, Take 1 tablet (4 mg total) by mouth every 8 (eight) hours as needed for nausea., Disp: 10 tablet, Rfl: 0 .  OXYGEN, Inhale 2 L into the lungs daily as needed (shortness of breath). , Disp: , Rfl:  .  polyethylene glycol powder (GLYCOLAX/MIRALAX) powder, Take 17 g by mouth 2 (two) times daily. Until daily soft stools OTC, Disp: 500 g, Rfl: 0 .  potassium chloride SA (K-DUR,KLOR-CON) 20 MEQ tablet, Take 1 tablet (20 mEq total) by mouth daily as needed (along with lasix.). (Patient taking differently: Take 20 mEq by mouth daily. ), Disp: 30 tablet, Rfl: 0 .  pravastatin (PRAVACHOL) 40 MG tablet, Take 40 mg by mouth daily. , Disp: , Rfl:  .  traZODone (DESYREL) 100 MG tablet, TAKE 1 TABLET BY MOUTH EVERY DAY AT BEDTIME AS NEEDED, Disp: , Rfl: 2 .  venlafaxine XR (EFFEXOR-XR) 150 MG 24 hr capsule, Take 150 mg by mouth See admin instructions. Take one capsule (150 mg) by mouth with a 75 mg capsule for a total dose of 225 mg daily, Disp: , Rfl:  .  venlafaxine XR (EFFEXOR-XR) 75 MG 24 hr capsule, Take 75 mg by mouth See admin instructions. Take one capsule (75 mg) by mouth with a 150 mg capsule for a total dose of 225 mg daily, Disp: , Rfl:    Exam: Current vital signs: BP (!) 151/86   Pulse 66   Temp 97.6 F (36.4 C) (Oral)   Resp 17   Ht 5\' 3"  (1.6 m)   Wt 71.8 kg   SpO2 100%   BMI 28.02 kg/m  Vital signs in last 24 hours: Temp:  [97.6 F (36.4 C)] 97.6 F (36.4 C) (11/12 1701) Pulse Rate:  [66] 66 (11/12 1701) Resp:  [17] 17 (11/12 1701) BP: (151)/(86) 151/86 (11/12 1701) SpO2:  [100 %] 100 % (11/12 1701) Weight:  [71.8 kg] 71.8 kg (11/12 1701)  GENERAL: Awake, alert in NAD HEENT: - Normocephalic and atraumatic, dry mm, no LN++, no  Thyromegally LUNGS - Clear to auscultation bilaterally with no wheezes CV - S1S2 RRR, no m/r/g, equal pulses bilaterally. ABDOMEN - Soft, nontender, nondistended with normoactive BS Ext: warm, well perfused, intact peripheral pulses, no edema  NEURO:  Mental Status: AA&Ox3  Language: speech is not dysarthric- has dentures.  Naming, repetition, fluency, and comprehension intact. Cranial Nerves: PERRL EOMI, visual fields full, no facial asymmetry,facial sensation intact, hearing intact, tongue/uvula/soft palate midline, normal sternocleidomastoid and trapezius muscle strength. No evidence of tongue atrophy  or fibrillations Motor: 5/5 in all 4 extremities with no vertical drift with the exception of right upper extremity which is limited due to shoulder pain. Tone: is normal and bulk is normal Sensation- Intact to light touch bilaterally Coordination: FTN intact bilaterally Gait- deferred  NIHSS=0  Labs I have reviewed labs in epic and the results pertinent to this consultation are:  CBC    Component Value Date/Time   WBC 6.8 05/26/2018 0254   RBC 5.42 (H) 05/26/2018 0254   HGB 14.4 05/26/2018 0254   HGB 13.0 03/23/2017 1008   HCT 47.9 (H) 05/26/2018 0254   HCT 40.9 03/23/2017 1008   PLT 169 05/26/2018 0254   PLT 207 03/23/2017 1008   MCV 88.4 05/26/2018 0254   MCV 76 (L) 03/23/2017 1008   MCH 26.6 05/26/2018 0254   MCHC 30.1 05/26/2018 0254   RDW 18.6 (H) 05/26/2018 0254   RDW 17.0 (H) 03/23/2017 1008   LYMPHSABS 2.3 05/26/2018 0254   MONOABS 0.6 05/26/2018 0254   EOSABS 0.3 05/26/2018 0254   BASOSABS 0.1 05/26/2018 0254    CMP     Component Value Date/Time   NA 142 05/26/2018 0254   NA 140 03/18/2018 0948   K 4.0 05/26/2018 0254   CL 106 05/26/2018 0254   CO2 29 05/26/2018 0254   GLUCOSE 117 (H) 05/26/2018 0254   BUN 9 05/26/2018 0254   BUN 17 03/18/2018 0948   CREATININE 0.92 05/26/2018 0254   CREATININE 1.16 (H) 10/23/2016 1222   CALCIUM 9.0 05/26/2018 0254    PROT 6.5 05/26/2018 0254   ALBUMIN 3.2 (L) 05/26/2018 0254   AST 21 05/26/2018 0254   ALT 12 05/26/2018 0254   ALKPHOS 90 05/26/2018 0254   BILITOT 0.9 05/26/2018 0254   GFRNONAA 54 (L) 05/26/2018 0254   GFRNONAA 42 (L) 02/06/2015 1619   GFRAA >60 05/26/2018 0254   GFRAA 49 (L) 02/06/2015 1619   Imaging I have reviewed the images obtained:  CT-scan of the brain-no acute changes.  No bleed.  No evidence of evolving stroke.  Assessment:  84 year old woman with past medical history of atrial fibrillation not on anticoagulation anymore because that was discontinued due to falls, CHF, COPD on home oxygen, dementia, history of depression, history of stroke with no residual deficits, right shoulder pain, presented for a 15-minute episode of slurred speech and bilateral lower extremity weakness. I do not think that this was a TIA and cannot be localized to a vascular territory I would recommend that labs be checked, infection be ruled out and if all is negative, she be discharged home with outpatient neurology follow-up.  Impression 15-minute episode of slurred speech and bilateral lower extremity weakness. Toxic metabolic encephalopathy  Recommendations: CMP CBC Urinalysis Chest x-ray Check ammonia level Check orthostatic vitals as she complains of being dizzy every morning. If all is negative and she remains baseline, she can be discharged home.  Please call neurology with questions.  We will be available as needed.  -- Amie [place], MD [facility] Pager: [phone] If 7pm to 7am, please call on call as listed on AMION.

## 2018-08-03 NOTE — ED Triage Notes (Signed)
Pt arrives via EMS from home with reports of bilateral leg weakness and numbness, and slurred speech. LSN at 15:45 and symptoms resolved by 1600. Pt reports being off xarelto due to frequent falls. Pt on home O2 2L

## 2018-08-03 NOTE — ED Notes (Signed)
Patient transported to X-ray 

## 2018-08-03 NOTE — H&P (Signed)
History and Physical    Raven Gray JTT:[id number] DOB: 17-May-1928 DOA: 08/03/2018  Referring MD/NP/PA:   PCP: Raven Morning, DO   Patient coming from:  The patient is coming from home.  At baseline, pt is independent for most of ADL.        Chief Complaint: Left-sided weakness and numbness, slurred speech, confusion, dizziness  HPI: Raven Gray is a 84 y.o. female with medical history significant of hypertension, hyperlipidemia, COPD on 2 L nasal cannula oxygen, stroke, GERD, anxiety, tachycardia-bradycardia syndrome, pacemaker placement, gallbladder cancer, atrial fibrillation not on anticoagulant due to fall, CHF with, PE not on anticoagulants, who presents with left-sided weakness, numbness, slurred speech, confusion and dizziness.  Per patient's family, patient initially presents with transient slurred speech, leg weakness, leg numbness and dizziness.  Patient also had mild confusion. Symptoms started at about 15:45, then resolved by 16:00. While being evaluated in the ED, pt developed left-sided weakness and numbness in arm and leg at about 8:30 PM, which has been persistent.  Patient does not have facial droop, slurred speech, vision change or hearing loss.  Patient has mild dry cough and mild shortness of breath due to COPD, which are at baseline.  No chest pain, fever or chills.  Patient has nausea, no vomiting, diarrhea or abdominal pain pain no symptoms of UTI.  She had confusion earlier, which has resolved.  Currently patient is oriented x3.  ED Course: pt was found to have WBC 5.2, platelets 42, negative urinalysis, INR 1.01, PTT 31, BNP 635.6, electrolytes renal function okay, bradycardia, oxygen saturation 97% on 2 L nasal cannula oxygen, temperature normal.  CT angiogram of neck and head is negative for LVO.  CT of the brain perfusion showed 22 mm of right MCA infarction.  Neurology, Dr. Leonel Ramsay was consulted.  Review of Systems:   General: no fevers, chills, no body weight  gain, has fatigue HEENT: no blurry vision, hearing changes or sore throat Respiratory: no dyspnea, coughing, wheezing CV: no chest pain, no palpitations GI: has nausea, no vomiting, abdominal pain, diarrhea, constipation GU: no dysuria, burning on urination, increased urinary frequency, hematuria  Ext: has mild leg edema Neuro: has left-sided weakness and numbness, slurred speech, confusion, dizziness Skin: no rash, no skin tear. MSK: No muscle spasm, no deformity, no limitation of range of movement in spin Heme: No easy bruising.  Travel history: No recent long distant travel.  Allergy: No Known Allergies  Past Medical History:  Diagnosis Date  . Atrial fibrillation ([place])    off AC due to falls  . Chest pain   . CHF (congestive heart failure) ([place])   . Cholecystitis 03/2018  . COPD (chronic obstructive pulmonary disease) (HCC)    wears prn home O2  . Dementia ([place])   . H/O echocardiogram    a. 08/2016: EF of 60-65%, severely dilated LA, mild pulmonic regurgitations, mild TR, and PA Pressure at 38 mm Hg  . History of depression   . History of pulmonary embolism   . Hyperlipidemia   . Hypertension   . Hypokalemia    Now improved with treatment  . Pre-diabetes   . Shortness of breath   . Stroke [facility])    brainstem aneurysm(?) in 2016  . Tachy-brady syndrome ([place]) 04/01/2017   s/p MDT PPM     Past Surgical History:  Procedure Laterality Date  . ABDOMINAL HYSTERECTOMY    . BACK SURGERY    . CHOLECYSTECTOMY N/A 03/29/2018   Procedure: LAPAROSCOPIC CHOLECYSTECTOMY WITH  INTRAOPERATIVE CHOLANGIOGRAM;  Surgeon: Alphonsa Overall, MD;  Location: [facility];  Service: General;  Laterality: N/A;  . PACEMAKER IMPLANT N/A 04/01/2017   Procedure: Pacemaker Implant;  Surgeon: Sanda Klein, MD;  Location: [place] [facility];  Service: Cardiovascular;  Laterality: N/A; MDT [facility] XT SR MRI  . REPLACEMENT TOTAL KNEE    . ROTATOR CUFF REPAIR      Social History:  reports that she has never  smoked. She has never used smokeless tobacco. She reports that she does not drink alcohol or use drugs.  Family History:  Family History  Problem Relation Age of Onset  . Alzheimer's disease Mother   . Heart attack Father   . Diabetes type II Daughter   . Heart disease Daughter        stents     Prior to Admission medications   Medication Sig Start Date End Date Taking? Authorizing Provider  acetaminophen (TYLENOL) 325 MG tablet Take 650 mg by mouth every 6 (six) hours as needed (for pain).    Yes [provider]  ALPRAZolam (XANAX) 0.25 MG tablet Take 1 tablet (0.25 mg total) by mouth 3 (three) times daily as needed for anxiety (anxiety). Patient taking differently: Take 0.25 mg by mouth 3 (three) times daily as needed for anxiety.  04/01/18  Yes Florencia Reasons, MD  amLODipine (NORVASC) 2.5 MG tablet Take 1 tablet (2.5 mg total) by mouth daily. 07/21/18 07/16/19 Yes Croitoru, Mihai, MD  furosemide (LASIX) 40 MG tablet Take 1 tablet (40 mg total) Tuesdays, Thursdays, Saturdays, and Sundays. Take 0.5 tablet (20 mg total) Mondays, Wednesdays, and Fridays. Patient taking differently: Take 20-40 mg by mouth See admin instructions. Take 40 mg by mouth on Sun/Tues/Thurs/Sat and 20 mg on Mon/Wed/Fri 07/21/18  Yes Croitoru, Mihai, MD  [facility]) MISC Apply 1 patch topically See admin instructions. Apply 1 patch to the back daily as needed for pain (remove old one first)   Yes [provider]  ipratropium-albuterol (DUONEB) 0.5-2.5 (3) MG/3ML SOLN Take 3 mLs by nebulization every 6 (six) hours as needed (for wheezing or shortness of breath).  07/06/18  Yes [provider]  MAGNESIUM PO Take 1 tablet by mouth at bedtime.    Yes [provider]  Melatonin 5 MG TABS Take 5 mg by mouth at bedtime.   Yes [provider]  metoprolol tartrate (LOPRESSOR) 25 MG tablet Take 0.5 tablets (12.5 mg total) by mouth 2 (two) times daily. 07/21/18  Yes Croitoru,  Mihai, MD  montelukast (SINGULAIR) 10 MG tablet Take 10 mg by mouth at bedtime.   Yes [provider]  Multiple Vitamin (MULTIVITAMIN WITH MINERALS) TABS tablet Take 1 tablet by mouth daily.   Yes [provider]  nitroGLYCERIN (NITROSTAT) 0.4 MG SL tablet Place 0.4 mg under the tongue every 5 (five) minutes x 3 doses as needed for chest pain.  02/08/16  Yes [provider]  omeprazole (PRILOSEC) 20 MG capsule Take 20 mg by mouth daily as needed (for reflux/heartburn).  04/23/15  Yes [provider]  ondansetron (ZOFRAN ODT) 4 MG disintegrating tablet Take 1 tablet (4 mg total) by mouth every 8 (eight) hours as needed for nausea. 05/23/18  Yes Larene Pickett, PA-C  OXYGEN Inhale 2 L into the lungs continuous.    Yes [provider]  polyethylene glycol powder (GLYCOLAX/MIRALAX) powder Take 17 g by mouth 2 (two) times daily. Until daily soft stools OTC Patient taking differently: Take 17 g by mouth  2 (two) times daily as needed for mild constipation or moderate constipation.  05/23/18  Yes Larene Pickett, PA-C  potassium chloride SA (K-DUR,KLOR-CON) 20 MEQ tablet Take 1 tablet (20 mEq total) by mouth daily as needed (along with lasix.). Patient taking differently: Take 20 mEq by mouth daily.  11/09/17  Yes Lavina Hamman, MD  pravastatin (PRAVACHOL) 40 MG tablet Take 40 mg by mouth daily.    Yes [provider]  traZODone (DESYREL) 100 MG tablet Take 100 mg by mouth at bedtime.  06/24/18  Yes [provider]  venlafaxine XR (EFFEXOR-XR) 150 MG 24 hr capsule Take 150 mg by mouth every Gray.    Yes [provider]  venlafaxine XR (EFFEXOR-XR) 75 MG 24 hr capsule Take 75 mg by mouth every Gray.  07/14/17  Yes [provider]    Physical Exam: Vitals:   08/04/18 0100 08/04/18 0115 08/04/18 0130 08/04/18 0145  BP: (!) 150/94 (!) 153/23 (!) 145/62 (!) 167/84  Pulse: 68 65 64 67  Resp: 12 16 18 13   Temp:      TempSrc:       SpO2: 94% 97% 96% 98%  Weight:      Height:       General: Not in acute distress HEENT:       Eyes: PERRL, EOMI, no scleral icterus.       ENT: No discharge from the ears and nose, no pharynx injection, no tonsillar enlargement.        Neck: No JVD, no bruit, no mass felt. Heme: No neck lymph node enlargement. Cardiac: S1/S2, RRR, No murmurs, No gallops or rubs. Respiratory: No rales, wheezing, rhonchi or rubs. GI: Soft, nondistended, nontender, no rebound pain, no organomegaly, BS present. GU: No hematuria Ext: has trace leg edema bilaterally. 2+DP/PT pulse bilaterally. Musculoskeletal: No joint deformities, No joint redness or warmth, no limitation of ROM in spin. Skin: No rashes.  Neuro: Alert, oriented X3, cranial nerves II-XII grossly intact. Muscle strength 1/5 left arm and leg, 5.5 in right extremities. Sensation to light touch decreased in left side of body. Brachial reflex 2+ bilaterally. Negative Babinski's sign.Psych: Patient is not psychotic, no suicidal or hemocidal ideation.  Labs on Admission: I have personally reviewed following labs and imaging studies  CBC: Recent Labs  Lab 08/03/18 1653 08/03/18 1741  WBC  --  5.2  NEUTROABS  --  2.9  HGB 15.3* 14.2  HCT 45.0 47.1*  MCV  --  93.6  PLT  --  42*   Basic Metabolic Panel: Recent Labs  Lab 08/03/18 1642 08/03/18 1653  NA 140 140  K 3.9 3.9  CL 101 98  CO2 32  --   GLUCOSE 104* 101*  BUN 11 14  CREATININE 1.07* 1.00  CALCIUM 8.9  --    GFR: Estimated Creatinine Clearance: 37 mL/min (by C-G formula based on SCr of 1 mg/dL). Liver Function Tests: Recent Labs  Lab 08/03/18 1642  AST 28  ALT 13  ALKPHOS 84  BILITOT 0.9  PROT 6.7  ALBUMIN 3.4*   No results for input(s): LIPASE, AMYLASE in the last 168 hours. No results for input(s): AMMONIA in the last 168 hours. Coagulation Profile: Recent Labs  Lab 08/03/18 1642  INR 1.10   Cardiac Enzymes: No results for input(s): CKTOTAL, CKMB,  CKMBINDEX, TROPONINI in the last 168 hours. BNP (last 3 results) No results for input(s): PROBNP in the last 8760 hours. HbA1C: No results for input(s): HGBA1C in the  last 72 hours. CBG: Recent Labs  Lab 08/03/18 1648  GLUCAP 91   Lipid Profile: No results for input(s): CHOL, HDL, LDLCALC, TRIG, CHOLHDL, LDLDIRECT in the last 72 hours. Thyroid Function Tests: No results for input(s): TSH, T4TOTAL, FREET4, T3FREE, THYROIDAB in the last 72 hours. Anemia Panel: No results for input(s): VITAMINB12, FOLATE, FERRITIN, TIBC, IRON, RETICCTPCT in the last 72 hours. Urine analysis:    Component Value Date/Time   COLORURINE YELLOW 08/03/2018 1728   APPEARANCEUR CLEAR 08/03/2018 1728   LABSPEC 1.010 08/03/2018 1728   PHURINE 6.0 08/03/2018 1728   GLUCOSEU NEGATIVE 08/03/2018 1728   HGBUR NEGATIVE 08/03/2018 1728   BILIRUBINUR NEGATIVE 08/03/2018 1728   KETONESUR NEGATIVE 08/03/2018 1728   PROTEINUR NEGATIVE 08/03/2018 1728   UROBILINOGEN 1.0 10/20/2014 1614   NITRITE NEGATIVE 08/03/2018 1728   LEUKOCYTESUR NEGATIVE 08/03/2018 1728   Sepsis Labs: @LABRCNTIP (procalcitonin:4,lacticidven:4) )No results found for this or any previous visit (from the past 240 hour(s)).   Radiological Exams on Admission: Ct Angio Head W Or Wo Contrast  Result Date: 08/03/2018 CLINICAL DATA:  Left-sided deficits EXAM: CT ANGIOGRAPHY HEAD AND NECK CT PERFUSION BRAIN TECHNIQUE: Multidetector CT imaging of the head and neck was performed using the standard protocol during bolus administration of intravenous contrast. Multiplanar CT image reconstructions and MIPs were obtained to evaluate the vascular anatomy. Carotid stenosis measurements (when applicable) are obtained utilizing NASCET criteria, using the distal internal carotid diameter as the denominator. Multiphase CT imaging of the brain was performed following IV bolus contrast injection. Subsequent parametric perfusion maps were calculated using RAPID  software. CONTRAST:  182mL ISOVUE-370 IOPAMIDOL (ISOVUE-370) INJECTION 76% COMPARISON:  None. FINDINGS: CTA NECK FINDINGS SKELETON: There is no bony spinal canal stenosis. No lytic or blastic lesion. OTHER NECK: Normal pharynx, larynx and major salivary glands. No cervical lymphadenopathy. Unremarkable thyroid gland. UPPER CHEST: Small right pleural effusion. AORTIC ARCH: There is mild calcific atherosclerosis of the aortic arch. There is no aneurysm, dissection or hemodynamically significant stenosis of the visualized ascending aorta and aortic arch. Conventional 3 vessel aortic branching pattern. The visualized proximal subclavian arteries are widely patent. RIGHT CAROTID SYSTEM: --Common carotid artery: Widely patent origin without common carotid artery dissection or aneurysm. --Internal carotid artery: No dissection, occlusion or aneurysm. Mild atherosclerotic calcification at the carotid bifurcation without hemodynamically significant stenosis. --External carotid artery: No acute abnormality. LEFT CAROTID SYSTEM: --Common carotid artery: Widely patent origin without common carotid artery dissection or aneurysm. --Internal carotid artery: No dissection, occlusion or aneurysm. Mild atherosclerotic calcification at the carotid bifurcation without hemodynamically significant stenosis. --External carotid artery: No acute abnormality. VERTEBRAL ARTERIES: Left dominant configuration. Both origins are normal. No dissection, occlusion or flow-limiting stenosis to the vertebrobasilar confluence. CTA HEAD FINDINGS ANTERIOR CIRCULATION: --Intracranial internal carotid arteries: Normal. --Anterior cerebral arteries: Normal. Absent right A1 segment, normal variant --Middle cerebral arteries: Mild narrowing of both M1 segments. --Posterior communicating arteries: Absent bilaterally. POSTERIOR CIRCULATION: --Basilar artery: Normal. --Posterior cerebral arteries: Normal. --Superior cerebellar arteries: Normal. --Inferior  cerebellar arteries: Normal anterior and posterior inferior cerebellar arteries. VENOUS SINUSES: As permitted by contrast timing, patent. ANATOMIC VARIANTS: None DELAYED PHASE: No parenchymal contrast enhancement. Review of the MIP images confirms the above findings. CT Brain Perfusion Findings: CBF (<30%) Volume: 2mL Perfusion (Tmax>6.0s) volume: 64mL Mismatch Volume: 16mL Infarction Location:Posterior right MCA territory IMPRESSION: 1. No emergent large vessel occlusion. 2. 22 mL posterior right MCA territory area of ischemia by CT perfusion criteria. No demonstrated core infarct. 3. Mild bilateral M1 segment narrowing of  the middle cerebral arteries. 4.  Aortic atherosclerosis (ICD10-I70.0). 5. Incompletely visualized right pleural effusion. Electronically Signed   By: Ulyses Jarred M.D.   On: 08/03/2018 21:30   Dg Chest 2 View  Result Date: 08/03/2018 CLINICAL DATA:  Slurred speech EXAM: CHEST - 2 VIEW COMPARISON:  05/23/2018 FINDINGS: The heart is markedly enlarged. Vascular congestion. Diffuse interstitial edema. No pneumothorax. Hiatal hernia is unchanged. No pneumothorax. Single lead left subclavian pacemaker device is partially imaged. IMPRESSION: CHF with interstitial edema. Electronically Signed   By: Marybelle Killings M.D.   On: 08/03/2018 18:12   Ct Angio Neck W Or Wo Contrast  Result Date: 08/03/2018 CLINICAL DATA:  Left-sided deficits EXAM: CT ANGIOGRAPHY HEAD AND NECK CT PERFUSION BRAIN TECHNIQUE: Multidetector CT imaging of the head and neck was performed using the standard protocol during bolus administration of intravenous contrast. Multiplanar CT image reconstructions and MIPs were obtained to evaluate the vascular anatomy. Carotid stenosis measurements (when applicable) are obtained utilizing NASCET criteria, using the distal internal carotid diameter as the denominator. Multiphase CT imaging of the brain was performed following IV bolus contrast injection. Subsequent parametric perfusion  maps were calculated using RAPID software. CONTRAST:  124mL ISOVUE-370 IOPAMIDOL (ISOVUE-370) INJECTION 76% COMPARISON:  None. FINDINGS: CTA NECK FINDINGS SKELETON: There is no bony spinal canal stenosis. No lytic or blastic lesion. OTHER NECK: Normal pharynx, larynx and major salivary glands. No cervical lymphadenopathy. Unremarkable thyroid gland. UPPER CHEST: Small right pleural effusion. AORTIC ARCH: There is mild calcific atherosclerosis of the aortic arch. There is no aneurysm, dissection or hemodynamically significant stenosis of the visualized ascending aorta and aortic arch. Conventional 3 vessel aortic branching pattern. The visualized proximal subclavian arteries are widely patent. RIGHT CAROTID SYSTEM: --Common carotid artery: Widely patent origin without common carotid artery dissection or aneurysm. --Internal carotid artery: No dissection, occlusion or aneurysm. Mild atherosclerotic calcification at the carotid bifurcation without hemodynamically significant stenosis. --External carotid artery: No acute abnormality. LEFT CAROTID SYSTEM: --Common carotid artery: Widely patent origin without common carotid artery dissection or aneurysm. --Internal carotid artery: No dissection, occlusion or aneurysm. Mild atherosclerotic calcification at the carotid bifurcation without hemodynamically significant stenosis. --External carotid artery: No acute abnormality. VERTEBRAL ARTERIES: Left dominant configuration. Both origins are normal. No dissection, occlusion or flow-limiting stenosis to the vertebrobasilar confluence. CTA HEAD FINDINGS ANTERIOR CIRCULATION: --Intracranial internal carotid arteries: Normal. --Anterior cerebral arteries: Normal. Absent right A1 segment, normal variant --Middle cerebral arteries: Mild narrowing of both M1 segments. --Posterior communicating arteries: Absent bilaterally. POSTERIOR CIRCULATION: --Basilar artery: Normal. --Posterior cerebral arteries: Normal. --Superior cerebellar  arteries: Normal. --Inferior cerebellar arteries: Normal anterior and posterior inferior cerebellar arteries. VENOUS SINUSES: As permitted by contrast timing, patent. ANATOMIC VARIANTS: None DELAYED PHASE: No parenchymal contrast enhancement. Review of the MIP images confirms the above findings. CT Brain Perfusion Findings: CBF (<30%) Volume: 76mL Perfusion (Tmax>6.0s) volume: 58mL Mismatch Volume: 32mL Infarction Location:Posterior right MCA territory IMPRESSION: 1. No emergent large vessel occlusion. 2. 22 mL posterior right MCA territory area of ischemia by CT perfusion criteria. No demonstrated core infarct. 3. Mild bilateral M1 segment narrowing of the middle cerebral arteries. 4.  Aortic atherosclerosis (ICD10-I70.0). 5. Incompletely visualized right pleural effusion. Electronically Signed   By: Ulyses Jarred M.D.   On: 08/03/2018 21:30   Ct Cerebral Perfusion W Contrast  Result Date: 08/03/2018 CLINICAL DATA:  Left-sided deficits EXAM: CT ANGIOGRAPHY HEAD AND NECK CT PERFUSION BRAIN TECHNIQUE: Multidetector CT imaging of the head and neck was performed using the standard protocol during  bolus administration of intravenous contrast. Multiplanar CT image reconstructions and MIPs were obtained to evaluate the vascular anatomy. Carotid stenosis measurements (when applicable) are obtained utilizing NASCET criteria, using the distal internal carotid diameter as the denominator. Multiphase CT imaging of the brain was performed following IV bolus contrast injection. Subsequent parametric perfusion maps were calculated using RAPID software. CONTRAST:  158mL ISOVUE-370 IOPAMIDOL (ISOVUE-370) INJECTION 76% COMPARISON:  None. FINDINGS: CTA NECK FINDINGS SKELETON: There is no bony spinal canal stenosis. No lytic or blastic lesion. OTHER NECK: Normal pharynx, larynx and major salivary glands. No cervical lymphadenopathy. Unremarkable thyroid gland. UPPER CHEST: Small right pleural effusion. AORTIC ARCH: There is mild  calcific atherosclerosis of the aortic arch. There is no aneurysm, dissection or hemodynamically significant stenosis of the visualized ascending aorta and aortic arch. Conventional 3 vessel aortic branching pattern. The visualized proximal subclavian arteries are widely patent. RIGHT CAROTID SYSTEM: --Common carotid artery: Widely patent origin without common carotid artery dissection or aneurysm. --Internal carotid artery: No dissection, occlusion or aneurysm. Mild atherosclerotic calcification at the carotid bifurcation without hemodynamically significant stenosis. --External carotid artery: No acute abnormality. LEFT CAROTID SYSTEM: --Common carotid artery: Widely patent origin without common carotid artery dissection or aneurysm. --Internal carotid artery: No dissection, occlusion or aneurysm. Mild atherosclerotic calcification at the carotid bifurcation without hemodynamically significant stenosis. --External carotid artery: No acute abnormality. VERTEBRAL ARTERIES: Left dominant configuration. Both origins are normal. No dissection, occlusion or flow-limiting stenosis to the vertebrobasilar confluence. CTA HEAD FINDINGS ANTERIOR CIRCULATION: --Intracranial internal carotid arteries: Normal. --Anterior cerebral arteries: Normal. Absent right A1 segment, normal variant --Middle cerebral arteries: Mild narrowing of both M1 segments. --Posterior communicating arteries: Absent bilaterally. POSTERIOR CIRCULATION: --Basilar artery: Normal. --Posterior cerebral arteries: Normal. --Superior cerebellar arteries: Normal. --Inferior cerebellar arteries: Normal anterior and posterior inferior cerebellar arteries. VENOUS SINUSES: As permitted by contrast timing, patent. ANATOMIC VARIANTS: None DELAYED PHASE: No parenchymal contrast enhancement. Review of the MIP images confirms the above findings. CT Brain Perfusion Findings: CBF (<30%) Volume: 37mL Perfusion (Tmax>6.0s) volume: 34mL Mismatch Volume: 62mL Infarction  Location:Posterior right MCA territory IMPRESSION: 1. No emergent large vessel occlusion. 2. 22 mL posterior right MCA territory area of ischemia by CT perfusion criteria. No demonstrated core infarct. 3. Mild bilateral M1 segment narrowing of the middle cerebral arteries. 4.  Aortic atherosclerosis (ICD10-I70.0). 5. Incompletely visualized right pleural effusion. Electronically Signed   By: Ulyses Jarred M.D.   On: 08/03/2018 21:30   Ct Head Code Stroke Wo Contrast  Result Date: 08/03/2018 CLINICAL DATA:  Code stroke. 84 year female with slurred speech, leg weakness and numbness. Last known well 1545 hours. EXAM: CT HEAD WITHOUT CONTRAST TECHNIQUE: Contiguous axial images were obtained from the base of the skull through the vertex without intravenous contrast. COMPARISON:  Head CT without contrast 05/23/2018 and earlier. FINDINGS: Brain: Stable cerebral volume. Stable ventricle size and configuration. Heterogeneous hypodensity in the bilateral cerebral white matter, deep gray matter, and right cerebellum compatible with chronic small vessel disease appears stable since September. Small area of left occipital pole cortical encephalomalacia is stable. No midline shift, mass effect, evidence of mass lesion, intracranial hemorrhage or evidence of cortically based acute infarction. Vascular: Calcified atherosclerosis at the skull base. No suspicious intracranial vascular hyperdensity. Skull: No acute osseous abnormality identified. Sinuses/Orbits: Visualized paranasal sinuses and mastoids are stable and well pneumatized. Other: Stable orbit and scalp soft tissues. ASPECTS [facility] Stroke Program Early CT Score) - Ganglionic level infarction (caudate, lentiform nuclei, internal capsule, insula, M1-M3 cortex): 7 -  Supraganglionic infarction (M4-M6 cortex): 3 Total score (0-10 with 10 being normal): 10 IMPRESSION: 1. No acute intracranial hemorrhage or acute cortically based infarct identified. ASPECTS is 10. 2.  Chronic ischemic disease appears stable since September. 3. These results were communicated to Dr. Rory Percy at 5:06 pmon 11/12/2019by text page via the [facility] messaging system. Electronically Signed   By: Genevie Ann M.D.   On: 08/03/2018 17:07     EKG: Independently reviewed.  Not done in ED, will get one.   Assessment/Plan Principal Problem:   Stroke [facility]) Active Problems:   Essential hypertension   GERD (gastroesophageal reflux disease)   Chronic respiratory failure with hypoxia (HCC)   Acute metabolic encephalopathy   Chronic atrial fibrillation   Pacemaker   Chronic diastolic CHF (congestive heart failure) (HCC)   COPD with emphysema (HCC)   HLD (hyperlipidemia)   Primary gall bladder adenocarcinoma (HCC)   Thrombocytopenia (HCC)   Stroke [facility]):  CT angiogram of neck and head is negative for LVO.  CT of the brain perfusion showed 22 mm of right MCA infarction.  Neurology, Dr. Leonel Ramsay was consulted.  - will place on tele bed for obs - will follow up Neurology's Recs.  - start ASA  - on pravastatin - fasting lipid panel and HbA1c  - 2D transthoracic echocardiography  - swallowing screen. If fails, will get SLP - Check UDS  - PT/OT consult  Essential hypertension: -hold amlodipine to allow permissive hypertension -Continue metoprolol which is for atrial fibrillation -Continue Lasix which is a for CHF -IV hydralazine PRN for SBP>220 or dBP>120  GERD: -Protonix  Chronic Atrial Fibrillation: CHA2DS2-VASc Score is 7, needs oral anticoagulation, but pt is off AC due to hight risks of fall. Heart rate is well controlled. -continue metoprolol  Chronic diastolic CHF (congestive heart failure): BMP 6-35.6, but patient has trace leg edema, no worsening shortness of breath.  Due to acute stroke, we will not escalate diuretics. -Continue home dose Lasix  Chronic respiratory failure with hypoxia due to COPD: stable -Continue bronchodilators.  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 Burr Medico. S/p of cholecystectomy. CT abdomen scan did not show evidence of metastatic disease. Given her advanced age, and significant medical comorbodities. No chemo was recommended by oncologist. -f/u with Dr. Burr Medico  Thrombocytopenia [facility]): Platelet 42. This is a new issue.  Unclear etiology.  No bleeding tendency. - LDH and peripheral smear  Acute metabolic encephalopathy: Has resolved.  Mental status comes back to baseline.  Currently patient is oriented x3.  Likely due to stroke - Frequent neuro check.   DVT ppx: SCD Code Status: DNR (pt has yellow paper which documented a DNR.  Her 2daughters confirmed that patient is DNR). Family Communication:  Yes, patient's 2 dauhgter at bed side Disposition Plan:  To be determined Consults called: Neurology, Dr. Leonel Ramsay Admission status: Obs / tele   Date of Service 08/04/2018    Ivor Costa [facility] Pager [phone]  If 7PM-7AM, please contact night-coverage [email or web address] Password [email or web address] 08/04/2018, 2:13 AM

## 2018-08-03 NOTE — Progress Notes (Addendum)
Called earlier due to patient worsening.  She has developed left-sided weakness and neglect.  Due to this, she was taken for emergent CT A/P, which reveals an area of ischemia but no large vessel occlusion that be a candidate for intervention.  Though she was mild earlier, she was never completely at her baseline and therefore she was unfortunately not an IV TPA candidate either.  Roland Rack, MD [facility] [phone]  If 7pm- 7am, please page neurology on call as listed in [place].

## 2018-08-03 NOTE — ED Provider Notes (Signed)
Emergency Department Provider Note   I have reviewed the triage vital signs and the nursing notes.   HISTORY  Chief Complaint Code Stroke   HPI Raven Gray is a 84 y.o. female with history of dementia who is unable to provide much history the presents to the emergency department today secondary to altered mental status.  Sounds like from the daughter that the patient was having increased fidgeting and when they went to help her up she could not stand due to generalized weakness.  She was having significantly slurred speech to the point where they cannot understand a word that she was saying.  Her son-in-law had the fully lift her to help her go to the bathroom so they called EMS.  EMS activated code stroke and brought her here. No other associated or modifying symptoms.    Past Medical History:  Diagnosis Date  . Atrial fibrillation ([place])    off AC due to falls  . Chest pain   . CHF (congestive heart failure) ([place])   . Cholecystitis 03/2018  . COPD (chronic obstructive pulmonary disease) (H[facility])    wears prn home O2  . Dementia ([place])   . H/O echocardiogram    a. 08/2016: EF of 60-65%, severely dilated LA, mild pulmonic regurgitations, mild TR, and PA Pressure at 38 mm Hg  . History of depression   . History of pulmonary embolism   . Hyperlipidemia   . Hypertension   . Hypokalemia    Now improved with treatment  . Pre-diabetes   . Shortness of breath   . 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 Tachy-brady syndrome ([place]) 04/01/2017   s/p MDT PPM     Patient Active Problem List   Diagnosis Date Noted  . Pre-syncope 05/23/2018  . 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 Acute cholecystitis 03/29/2018  . Unilateral primary osteoarthritis, left knee   . Palliative care by specialist   . Epistaxis   . Syncope 11/02/2017  . Permanent atrial fibrillation 11/02/2017  . Nasal fracture 11/02/2017  . Fall at home 11/02/2017  . COPD with emphysema  ([place]) 11/02/2017  . HLD (hyperlipidemia) 11/02/2017  . History of pulmonary embolism 07/21/2017  . Nonsustained ventricular tachycardia ([place]) 07/21/2017  . Healthcare-associated pneumonia 07/01/2017  . Acute respiratory failure with hypoxia ([place]) 07/01/2017  . Acute diastolic heart failure ([place]) 07/01/2017  . Conjunctivitis 07/01/2017  . Sepsis ([place]) 07/01/2017  . HCAP (healthcare-associated pneumonia)   . Acute pulmonary edema (HCC)   . Tachycardia-bradycardia syndrome ([place]) 04/01/2017  . Pacemaker 04/01/2017  . Chronic atrial fibrillation   . Syncope and collapse   . Loss of consciousness ([place]) 01/08/2017  . Dehydration 11/25/2016  . Acute encephalopathy   . Encephalopathy 11/24/2016  . Dyspnea on exertion 10/29/2016  . Chronic respiratory failure with hypoxia ([place]) 10/29/2016  . Long term current use of anticoagulant 10/23/2016  . TIA (transient ischemic attack) 09/06/2016  . COPD (chronic obstructive pulmonary disease) ([place]) 09/06/2016  . Essential hypertension 09/06/2016  . GERD (gastroesophageal reflux disease) 09/06/2016  . Depression 09/06/2016  . Hyperlipidemia 10/23/2015  . Aortic insufficiency 12/15/2014  . Edema of both legs 09/19/2014  . Chest pain 11/21/2013  . Community acquired pneumonia 06/19/2012  . Pulmonary embolism ([place]) 06/19/2012  . Hypokalemia 06/18/2012  . COPD with acute exacerbation ([place]) 06/18/2012  . Hypoxia 06/18/2012    Past Surgical History:  Procedure Laterality Date  . ABDOMINAL HYSTERECTOMY    . BACK SURGERY    .  CHOLECYSTECTOMY N/A 03/29/2018   Procedure: LAPAROSCOPIC CHOLECYSTECTOMY WITH INTRAOPERATIVE CHOLANGIOGRAM;  Surgeon: Alphonsa Overall, MD;  Location: [place];  Service: General;  Laterality: N/A;  . PACEMAKER IMPLANT N/A 04/01/2017   Procedure: Pacemaker Implant;  Surgeon: Sanda Klein, MD;  Location: [facility];  Service: Cardiovascular;  Laterality: N/A; MDT Azure XT SR MRI  . REPLACEMENT TOTAL KNEE    . ROTATOR CUFF  REPAIR      Current Outpatient Rx  . Order #: [id number] Class: Historical Med  . Order #: [id number] Class: Print  . Order #: [id number] Class: Normal  . Order #: [id number] Class: Normal  . Order #: [id number] Class: Historical Med  . Order #: [id number] Class: Historical Med  . Order #: [id number] Class: Historical Med  . Order #: [id number] Class: Normal  . Order #: [id number] Class: Historical Med  . Order #: [id number] Class: Historical Med  . Order #: [id number] Class: Historical Med  . Order #: [id number] Class: Historical Med  . Order #: [id number] Class: Print  . Order #: [id number] Class: Historical Med  . Order #: [id number] Class: Print  . Order #: [id number] Class: Normal  . Order #: [id number] Class: Historical Med  . Order #: [id number] Class: Historical Med  . Order #: [id number] Class: Historical Med  . Order #: [id number] Class: Historical Med    Allergies Patient has no known allergies.  Family History  Problem Relation Age of Onset  . Alzheimer's disease Mother   . Heart attack Father   . Diabetes type II Daughter   . Heart disease Daughter        stents    Social History Social History   Tobacco Use  . Smoking status: Never Smoker  . Smokeless tobacco: Never Used  Substance Use Topics  . Alcohol use: No  . Drug use: No    Review of Systems  All other systems negative except as documented in the HPI. All pertinent positives and negatives as reviewed in the HPI. ____________________________________________   PHYSICAL EXAM:  VITAL SIGNS: ED Triage Vitals [08/03/18 1701]  Enc Vitals Group     BP (!) 151/86     Pulse Rate 66     Resp 17     Temp 97.6 F (36.4 C)     Temp Source Oral     SpO2 100 %     Weight 158 lb 3.2 oz (71.8 kg)     Height 5\' 3"  (1.6 m)    Constitutional: Alert and disoriented. Still with fidgeting type movements. Well appearing and in no acute distress. Eyes: Conjunctivae are normal. PERRL. EOMI. Head: Atraumatic. Nose: No  congestion/rhinnorhea. Mouth/Throat: Mucous membranes are dry.  Oropharynx non-erythematous. Neck: No stridor.  No meningeal signs.   Cardiovascular: Normal rate, regular rhythm. Good peripheral circulation. Grossly normal heart sounds.   Respiratory: Normal respiratory effort.  No retractions. Lungs CTAB. Gastrointestinal: Soft and nontender. No distention.  Musculoskeletal: No lower extremity tenderness nor edema. No gross deformities of extremities. Neurologic:  Normal speech and language. No gross focal neurologic deficits are appreciated.  Skin:  Skin is warm, dry and intact. No rash noted.   ____________________________________________   LABS (all labs ordered are listed, but only abnormal results are displayed)  Labs Reviewed  I-STAT CHEM 8, ED - Abnormal; Notable for the following components:      Result Value   Glucose, Bld 101 (*)    TCO2 34 (*)    Hemoglobin 15.3 (*)    All other components within normal limits  PROTIME-INR  APTT  COMPREHENSIVE METABOLIC PANEL  CBC WITH DIFFERENTIAL/PLATELET  URINALYSIS, ROUTINE W REFLEX MICROSCOPIC  BRAIN NATRIURETIC PEPTIDE  I-STAT TROPONIN, ED  CBG MONITORING, ED   ____________________________________________  EKG   EKG Interpretation  Date/Time:  Tuesday August 03 2018 16:58:54 EST Ventricular Rate:  79 PR Interval:    QRS Duration: 209 QT Interval:  478 QTC Calculation: 548 R Axis:   -78 Text Interpretation:  not able to fully assess rhythm - paced? heart block? Nonspecific IVCD with LAD LVH with secondary repolarization abnormality Confirmed by Merrily Pew [phone]) on 08/03/2018 5:25:32 PM       ____________________________________________  RADIOLOGY  Ct Head Code Stroke Wo Contrast  Result Date: 08/03/2018 CLINICAL DATA:  Code stroke. 84 year female with slurred speech, leg weakness and numbness. Last known well 1545 hours. EXAM: CT HEAD WITHOUT CONTRAST TECHNIQUE: Contiguous axial images were obtained  from the base of the skull through the vertex without intravenous contrast. COMPARISON:  Head CT without contrast 05/23/2018 and earlier. FINDINGS: Brain: Stable cerebral volume. Stable ventricle size and configuration. Heterogeneous hypodensity in the bilateral cerebral white matter, deep gray matter, and right cerebellum compatible with chronic small vessel disease appears stable since September. Small area of left occipital pole cortical encephalomalacia is stable. No midline shift, mass effect, evidence of mass lesion, intracranial hemorrhage or evidence of cortically based acute infarction. Vascular: Calcified atherosclerosis at the skull base. No suspicious intracranial vascular hyperdensity. Skull: No acute osseous abnormality identified. Sinuses/Orbits: Visualized paranasal sinuses and mastoids are stable and well pneumatized. Other: Stable orbit and scalp soft tissues. ASPECTS [facility] Stroke Program Early CT Score) - Ganglionic level infarction (caudate, lentiform nuclei, internal capsule, insula, M1-M3 cortex): 7 - Supraganglionic infarction (M4-M6 cortex): 3 Total score (0-10 with 10 being normal): 10 IMPRESSION: 1. No acute intracranial hemorrhage or acute cortically based infarct identified. ASPECTS is 10. 2. Chronic ischemic disease appears stable since September. 3. These results were communicated to Dr. Rory Percy at 5:06 pmon 11/12/2019by text page via the [facility] messaging system. Electronically Signed   By: Genevie Ann M.D.   On: 08/03/2018 17:07    ____________________________________________   PROCEDURES  Procedure(s) performed:   Procedures  CRITICAL CARE Performed by: Merrily Pew Total critical care time: 40 minutes Critical care time was exclusive of separately billable procedures and treating other patients. Critical care was necessary to treat or prevent imminent or life-threatening deterioration. Critical care was time spent personally by me on the following activities: development  of treatment plan with patient and/or surrogate as well as nursing, discussions with consultants, evaluation of patient's response to treatment, examination of patient, obtaining history from patient or surrogate, ordering and performing treatments and interventions, ordering and review of laboratory studies, ordering and review of radiographic studies, pulse oximetry and re-evaluation of patient's condition. ____________________________________________   INITIAL IMPRESSION / ASSESSMENT AND PLAN / ED COURSE  Unclear etiology however stroke seems the least likely.  She does have a history of a stroke that the daughter describes as being a large clot that went to an artery that caused her to have decreased consciousness previously.  This time she is disoriented but seems to be pretty close to baseline but still fidgeting.  Plan for metabolic work-up and further observation.  Patient's work-up ultimately revealed that she had likely increased fluid overload and pulmonary edema.  Plan was to ambulate her and see if she was able to ambulate at baseline however she was found to have left-sided weakness at that time.  Discussed with neurology and code stroke activated again perfusion study  showed a MCA infarct however is not a candidate for thrombectomy so will admit to medicine.   Pertinent labs & imaging results that were available during my care of the patient were reviewed by me and considered in my medical decision making (see chart for details).  ____________________________________________  FINAL CLINICAL IMPRESSION(S) / ED DIAGNOSES  Final diagnoses:  Cerebrovascular accident (CVA), unspecified mechanism ([place])    MEDICATIONS GIVEN DURING THIS VISIT:  Medications  iopamidol (ISOVUE-370) 76 % injection 100 mL (100 mLs Intravenous Contrast Given 08/03/18 2105)     NEW OUTPATIENT MEDICATIONS STARTED DURING THIS VISIT:  New Prescriptions   No medications on file    Note:  This note was  prepared with assistance of Dragon voice recognition software. Occasional wrong-word or sound-a-like substitutions may have occurred due to the inherent limitations of voice recognition software.   Merrily Pew, MD 08/03/18 2202

## 2018-08-03 NOTE — ED Notes (Signed)
Pt. Unable to ambulate. Severe weakness on the left side. 2 person assist could not support her. RN, Minna Merritts to speak with the the doc.

## 2018-08-03 NOTE — ED Notes (Signed)
Pure wick placed.

## 2018-08-04 ENCOUNTER — Inpatient Hospital Stay (HOSPITAL_COMMUNITY): Payer: Medicare Other

## 2018-08-04 DIAGNOSIS — J439 Emphysema, unspecified: Secondary | ICD-10-CM | POA: Diagnosis not present

## 2018-08-04 DIAGNOSIS — E663 Overweight: Secondary | ICD-10-CM | POA: Diagnosis present

## 2018-08-04 DIAGNOSIS — H9193 Unspecified hearing loss, bilateral: Secondary | ICD-10-CM | POA: Diagnosis not present

## 2018-08-04 DIAGNOSIS — I130 Hypertensive heart and chronic kidney disease with heart failure and stage 1 through stage 4 chronic kidney disease, or unspecified chronic kidney disease: Secondary | ICD-10-CM | POA: Diagnosis present

## 2018-08-04 DIAGNOSIS — I6381 Other cerebral infarction due to occlusion or stenosis of small artery: Secondary | ICD-10-CM | POA: Diagnosis present

## 2018-08-04 DIAGNOSIS — R414 Neurologic neglect syndrome: Secondary | ICD-10-CM | POA: Diagnosis present

## 2018-08-04 DIAGNOSIS — I5032 Chronic diastolic (congestive) heart failure: Secondary | ICD-10-CM | POA: Diagnosis not present

## 2018-08-04 DIAGNOSIS — I351 Nonrheumatic aortic (valve) insufficiency: Secondary | ICD-10-CM | POA: Diagnosis not present

## 2018-08-04 DIAGNOSIS — I361 Nonrheumatic tricuspid (valve) insufficiency: Secondary | ICD-10-CM

## 2018-08-04 DIAGNOSIS — G459 Transient cerebral ischemic attack, unspecified: Secondary | ICD-10-CM | POA: Diagnosis not present

## 2018-08-04 DIAGNOSIS — N183 Chronic kidney disease, stage 3 (moderate): Secondary | ICD-10-CM | POA: Diagnosis present

## 2018-08-04 DIAGNOSIS — G8929 Other chronic pain: Secondary | ICD-10-CM | POA: Diagnosis not present

## 2018-08-04 DIAGNOSIS — R404 Transient alteration of awareness: Secondary | ICD-10-CM | POA: Diagnosis not present

## 2018-08-04 DIAGNOSIS — K5900 Constipation, unspecified: Secondary | ICD-10-CM | POA: Diagnosis not present

## 2018-08-04 DIAGNOSIS — D696 Thrombocytopenia, unspecified: Secondary | ICD-10-CM | POA: Diagnosis present

## 2018-08-04 DIAGNOSIS — M170 Bilateral primary osteoarthritis of knee: Secondary | ICD-10-CM | POA: Diagnosis not present

## 2018-08-04 DIAGNOSIS — I495 Sick sinus syndrome: Secondary | ICD-10-CM | POA: Diagnosis present

## 2018-08-04 DIAGNOSIS — I69354 Hemiplegia and hemiparesis following cerebral infarction affecting left non-dominant side: Secondary | ICD-10-CM | POA: Diagnosis not present

## 2018-08-04 DIAGNOSIS — I6300 Cerebral infarction due to thrombosis of unspecified precerebral artery: Secondary | ICD-10-CM | POA: Diagnosis not present

## 2018-08-04 DIAGNOSIS — I63511 Cerebral infarction due to unspecified occlusion or stenosis of right middle cerebral artery: Secondary | ICD-10-CM | POA: Diagnosis not present

## 2018-08-04 DIAGNOSIS — G8194 Hemiplegia, unspecified affecting left nondominant side: Secondary | ICD-10-CM | POA: Diagnosis present

## 2018-08-04 DIAGNOSIS — I482 Chronic atrial fibrillation, unspecified: Secondary | ICD-10-CM | POA: Diagnosis not present

## 2018-08-04 DIAGNOSIS — Z66 Do not resuscitate: Secondary | ICD-10-CM | POA: Diagnosis present

## 2018-08-04 DIAGNOSIS — J9601 Acute respiratory failure with hypoxia: Secondary | ICD-10-CM | POA: Diagnosis not present

## 2018-08-04 DIAGNOSIS — R29700 NIHSS score 0: Secondary | ICD-10-CM | POA: Diagnosis present

## 2018-08-04 DIAGNOSIS — E785 Hyperlipidemia, unspecified: Secondary | ICD-10-CM | POA: Diagnosis present

## 2018-08-04 DIAGNOSIS — F0390 Unspecified dementia without behavioral disturbance: Secondary | ICD-10-CM | POA: Diagnosis present

## 2018-08-04 DIAGNOSIS — K219 Gastro-esophageal reflux disease without esophagitis: Secondary | ICD-10-CM | POA: Diagnosis not present

## 2018-08-04 DIAGNOSIS — F329 Major depressive disorder, single episode, unspecified: Secondary | ICD-10-CM | POA: Diagnosis present

## 2018-08-04 DIAGNOSIS — R7303 Prediabetes: Secondary | ICD-10-CM | POA: Diagnosis present

## 2018-08-04 DIAGNOSIS — M549 Dorsalgia, unspecified: Secondary | ICD-10-CM | POA: Diagnosis present

## 2018-08-04 DIAGNOSIS — I10 Essential (primary) hypertension: Secondary | ICD-10-CM | POA: Diagnosis not present

## 2018-08-04 DIAGNOSIS — Z9981 Dependence on supplemental oxygen: Secondary | ICD-10-CM | POA: Diagnosis not present

## 2018-08-04 DIAGNOSIS — Z743 Need for continuous supervision: Secondary | ICD-10-CM | POA: Diagnosis not present

## 2018-08-04 DIAGNOSIS — K449 Diaphragmatic hernia without obstruction or gangrene: Secondary | ICD-10-CM | POA: Diagnosis present

## 2018-08-04 DIAGNOSIS — J449 Chronic obstructive pulmonary disease, unspecified: Secondary | ICD-10-CM | POA: Diagnosis not present

## 2018-08-04 DIAGNOSIS — J9611 Chronic respiratory failure with hypoxia: Secondary | ICD-10-CM | POA: Diagnosis not present

## 2018-08-04 DIAGNOSIS — M545 Low back pain: Secondary | ICD-10-CM | POA: Diagnosis not present

## 2018-08-04 DIAGNOSIS — R2681 Unsteadiness on feet: Secondary | ICD-10-CM | POA: Diagnosis not present

## 2018-08-04 DIAGNOSIS — M25562 Pain in left knee: Secondary | ICD-10-CM | POA: Diagnosis not present

## 2018-08-04 DIAGNOSIS — I639 Cerebral infarction, unspecified: Secondary | ICD-10-CM | POA: Diagnosis not present

## 2018-08-04 DIAGNOSIS — G9341 Metabolic encephalopathy: Secondary | ICD-10-CM | POA: Diagnosis not present

## 2018-08-04 DIAGNOSIS — G92 Toxic encephalopathy: Secondary | ICD-10-CM | POA: Diagnosis present

## 2018-08-04 DIAGNOSIS — R4781 Slurred speech: Secondary | ICD-10-CM | POA: Diagnosis present

## 2018-08-04 DIAGNOSIS — M6281 Muscle weakness (generalized): Secondary | ICD-10-CM | POA: Diagnosis not present

## 2018-08-04 DIAGNOSIS — R279 Unspecified lack of coordination: Secondary | ICD-10-CM | POA: Diagnosis not present

## 2018-08-04 DIAGNOSIS — R410 Disorientation, unspecified: Secondary | ICD-10-CM | POA: Diagnosis not present

## 2018-08-04 LAB — LIPID PANEL
CHOL/HDL RATIO: 2.9 ratio
Cholesterol: 121 mg/dL (ref 0–200)
HDL: 42 mg/dL (ref >40)
LDL CALC: 62 mg/dL (ref 0–99)
TRIGLYCERIDES: 86 mg/dL (ref <150)
VLDL: 17 mg/dL (ref 0–40)

## 2018-08-04 LAB — HEMOGLOBIN A1C
Hgb A1c MFr Bld: 5.8 % — ABNORMAL HIGH (ref 4.8–5.6)
Mean Plasma Glucose: 119.76 mg/dL

## 2018-08-04 LAB — ECHOCARDIOGRAM COMPLETE
Height: 63 in
Weight: 2531.2 oz

## 2018-08-04 MED ORDER — ASPIRIN 300 MG RE SUPP
300.0000 mg | Freq: Every day | RECTAL | Status: DC
  Administered 2018-08-04: 300 mg via RECTAL
  Filled 2018-08-04 (×2): qty 1

## 2018-08-04 NOTE — ED Notes (Signed)
Echo at the bedside °

## 2018-08-04 NOTE — Progress Notes (Addendum)
STROKE TEAM PROGRESS NOTE  HPI:( Dr Rory Percy ) Raven Gray is a 84 y.o. female past medical history of atrial fibrillation who used to be on anticoagulation with a newer anticoagulant but was taken off due to falls, CHF, COPD on home oxygen 2 L, dementia, history of depression, history of stroke, prediabetes, was in usual state of health this afternoon with last known normal 3:45 PM when she had sudden onset of bilateral leg weakness and her speech sounded slurred to the family. There were no other symptoms.  No visual symptoms no headaches that she reports. No focal weakness. No focal tingling or numbness.  She was weak and numb in both her legs. She reports feeling dizzy when she gets up in the morning every day. Her symptoms completely resolved by 4 PM-within 15 minutes. No recurrence of symptoms since then. EMS brought her in because of her history of atrial fibrillation and strokes as a code stroke.  LKW: 3:45 PM tpa given?: no, symptoms resolved NIH 0 Premorbid modified Rankin scale (mRS): 2 NIH stroke scale-0  INTERVAL HISTORY Patient is in the ED. No familiy at bedside.  She is from skilled nursing facility.  She most likely will be bedbound upon return.  We will have therapy evaluate.  Vitals:   08/04/18 0815 08/04/18 0910 08/04/18 1030 08/04/18 1100  BP: (!) 167/70 (!) 147/83 (!) 165/78 (!) 147/70  Pulse: 66 67 64 (!) 59  Resp: 16 17 20 19   Temp:      TempSrc:      SpO2: 96% 96% 96% 93%  Weight:      Height:        CBC:  Recent Labs  Lab 08/03/18 1653 08/03/18 1741  WBC  --  5.2  NEUTROABS  --  2.9  HGB 15.3* 14.2  HCT 45.0 47.1*  MCV  --  93.6  PLT  --  42*    Basic Metabolic Panel:  Recent Labs  Lab 08/03/18 1642 08/03/18 1653  NA 140 140  K 3.9 3.9  CL 101 98  CO2 32  --   GLUCOSE 104* 101*  BUN 11 14  CREATININE 1.07* 1.00  CALCIUM 8.9  --    Lipid Panel:     Component Value Date/Time   CHOL 121 08/04/2018 0258   TRIG 86 08/04/2018 0258   HDL  42 08/04/2018 0258   CHOLHDL 2.9 08/04/2018 0258   VLDL 17 08/04/2018 0258   LDLCALC 62 08/04/2018 0258   HgbA1c:  Lab Results  Component Value Date   HGBA1C 5.8 (H) 08/04/2018    IMAGING Ct Angio Head W Or Wo Contrast  Result Date: 08/03/2018 CLINICAL DATA:  Left-sided deficits EXAM: CT ANGIOGRAPHY HEAD AND NECK CT PERFUSION BRAIN TECHNIQUE: Multidetector CT imaging of the head and neck was performed using the standard protocol during bolus administration of intravenous contrast. Multiplanar CT image reconstructions and MIPs were obtained to evaluate the vascular anatomy. Carotid stenosis measurements (when applicable) are obtained utilizing NASCET criteria, using the distal internal carotid diameter as the denominator. Multiphase CT imaging of the brain was performed following IV bolus contrast injection. Subsequent parametric perfusion maps were calculated using RAPID software. CONTRAST:  152mL ISOVUE-370 IOPAMIDOL (ISOVUE-370) INJECTION 76% COMPARISON:  None. FINDINGS: CTA NECK FINDINGS SKELETON: There is no bony spinal canal stenosis. No lytic or blastic lesion. OTHER NECK: Normal pharynx, larynx and major salivary glands. No cervical lymphadenopathy. Unremarkable thyroid gland. UPPER CHEST: Small right pleural effusion. AORTIC ARCH: There is mild  calcific atherosclerosis of the aortic arch. There is no aneurysm, dissection or hemodynamically significant stenosis of the visualized ascending aorta and aortic arch. Conventional 3 vessel aortic branching pattern. The visualized proximal subclavian arteries are widely patent. RIGHT CAROTID SYSTEM: --Common carotid artery: Widely patent origin without common carotid artery dissection or aneurysm. --Internal carotid artery: No dissection, occlusion or aneurysm. Mild atherosclerotic calcification at the carotid bifurcation without hemodynamically significant stenosis. --External carotid artery: No acute abnormality. LEFT CAROTID SYSTEM: --Common carotid  artery: Widely patent origin without common carotid artery dissection or aneurysm. --Internal carotid artery: No dissection, occlusion or aneurysm. Mild atherosclerotic calcification at the carotid bifurcation without hemodynamically significant stenosis. --External carotid artery: No acute abnormality. VERTEBRAL ARTERIES: Left dominant configuration. Both origins are normal. No dissection, occlusion or flow-limiting stenosis to the vertebrobasilar confluence. CTA HEAD FINDINGS ANTERIOR CIRCULATION: --Intracranial internal carotid arteries: Normal. --Anterior cerebral arteries: Normal. Absent right A1 segment, normal variant --Middle cerebral arteries: Mild narrowing of both M1 segments. --Posterior communicating arteries: Absent bilaterally. POSTERIOR CIRCULATION: --Basilar artery: Normal. --Posterior cerebral arteries: Normal. --Superior cerebellar arteries: Normal. --Inferior cerebellar arteries: Normal anterior and posterior inferior cerebellar arteries. VENOUS SINUSES: As permitted by contrast timing, patent. ANATOMIC VARIANTS: None DELAYED PHASE: No parenchymal contrast enhancement. Review of the MIP images confirms the above findings. CT Brain Perfusion Findings: CBF (<30%) Volume: 80mL Perfusion (Tmax>6.0s) volume: 49mL Mismatch Volume: 85mL Infarction Location:Posterior right MCA territory IMPRESSION: 1. No emergent large vessel occlusion. 2. 22 mL posterior right MCA territory area of ischemia by CT perfusion criteria. No demonstrated core infarct. 3. Mild bilateral M1 segment narrowing of the middle cerebral arteries. 4.  Aortic atherosclerosis (ICD10-I70.0). 5. Incompletely visualized right pleural effusion. Electronically Signed   By: Ulyses Jarred M.D.   On: 08/03/2018 21:30   Dg Chest 2 View  Result Date: 08/03/2018 CLINICAL DATA:  Slurred speech EXAM: CHEST - 2 VIEW COMPARISON:  05/23/2018 FINDINGS: The heart is markedly enlarged. Vascular congestion. Diffuse interstitial edema. No pneumothorax.  Hiatal hernia is unchanged. No pneumothorax. Single lead left subclavian pacemaker device is partially imaged. IMPRESSION: CHF with interstitial edema. Electronically Signed   By: Marybelle Killings M.D.   On: 08/03/2018 18:12   Ct Angio Neck W Or Wo Contrast  Result Date: 08/03/2018 CLINICAL DATA:  Left-sided deficits EXAM: CT ANGIOGRAPHY HEAD AND NECK CT PERFUSION BRAIN TECHNIQUE: Multidetector CT imaging of the head and neck was performed using the standard protocol during bolus administration of intravenous contrast. Multiplanar CT image reconstructions and MIPs were obtained to evaluate the vascular anatomy. Carotid stenosis measurements (when applicable) are obtained utilizing NASCET criteria, using the distal internal carotid diameter as the denominator. Multiphase CT imaging of the brain was performed following IV bolus contrast injection. Subsequent parametric perfusion maps were calculated using RAPID software. CONTRAST:  161mL ISOVUE-370 IOPAMIDOL (ISOVUE-370) INJECTION 76% COMPARISON:  None. FINDINGS: CTA NECK FINDINGS SKELETON: There is no bony spinal canal stenosis. No lytic or blastic lesion. OTHER NECK: Normal pharynx, larynx and major salivary glands. No cervical lymphadenopathy. Unremarkable thyroid gland. UPPER CHEST: Small right pleural effusion. AORTIC ARCH: There is mild calcific atherosclerosis of the aortic arch. There is no aneurysm, dissection or hemodynamically significant stenosis of the visualized ascending aorta and aortic arch. Conventional 3 vessel aortic branching pattern. The visualized proximal subclavian arteries are widely patent. RIGHT CAROTID SYSTEM: --Common carotid artery: Widely patent origin without common carotid artery dissection or aneurysm. --Internal carotid artery: No dissection, occlusion or aneurysm. Mild atherosclerotic calcification at the carotid bifurcation without hemodynamically  significant stenosis. --External carotid artery: No acute abnormality. LEFT CAROTID  SYSTEM: --Common carotid artery: Widely patent origin without common carotid artery dissection or aneurysm. --Internal carotid artery: No dissection, occlusion or aneurysm. Mild atherosclerotic calcification at the carotid bifurcation without hemodynamically significant stenosis. --External carotid artery: No acute abnormality. VERTEBRAL ARTERIES: Left dominant configuration. Both origins are normal. No dissection, occlusion or flow-limiting stenosis to the vertebrobasilar confluence. CTA HEAD FINDINGS ANTERIOR CIRCULATION: --Intracranial internal carotid arteries: Normal. --Anterior cerebral arteries: Normal. Absent right A1 segment, normal variant --Middle cerebral arteries: Mild narrowing of both M1 segments. --Posterior communicating arteries: Absent bilaterally. POSTERIOR CIRCULATION: --Basilar artery: Normal. --Posterior cerebral arteries: Normal. --Superior cerebellar arteries: Normal. --Inferior cerebellar arteries: Normal anterior and posterior inferior cerebellar arteries. VENOUS SINUSES: As permitted by contrast timing, patent. ANATOMIC VARIANTS: None DELAYED PHASE: No parenchymal contrast enhancement. Review of the MIP images confirms the above findings. CT Brain Perfusion Findings: CBF (<30%) Volume: 50mL Perfusion (Tmax>6.0s) volume: 60mL Mismatch Volume: 31mL Infarction Location:Posterior right MCA territory IMPRESSION: 1. No emergent large vessel occlusion. 2. 22 mL posterior right MCA territory area of ischemia by CT perfusion criteria. No demonstrated core infarct. 3. Mild bilateral M1 segment narrowing of the middle cerebral arteries. 4.  Aortic atherosclerosis (ICD10-I70.0). 5. Incompletely visualized right pleural effusion. Electronically Signed   By: Ulyses Jarred M.D.   On: 08/03/2018 21:30   Ct Cerebral Perfusion W Contrast  Result Date: 08/03/2018 CLINICAL DATA:  Left-sided deficits EXAM: CT ANGIOGRAPHY HEAD AND NECK CT PERFUSION BRAIN TECHNIQUE: Multidetector CT imaging of the head and  neck was performed using the standard protocol during bolus administration of intravenous contrast. Multiplanar CT image reconstructions and MIPs were obtained to evaluate the vascular anatomy. Carotid stenosis measurements (when applicable) are obtained utilizing NASCET criteria, using the distal internal carotid diameter as the denominator. Multiphase CT imaging of the brain was performed following IV bolus contrast injection. Subsequent parametric perfusion maps were calculated using RAPID software. CONTRAST:  168mL ISOVUE-370 IOPAMIDOL (ISOVUE-370) INJECTION 76% COMPARISON:  None. FINDINGS: CTA NECK FINDINGS SKELETON: There is no bony spinal canal stenosis. No lytic or blastic lesion. OTHER NECK: Normal pharynx, larynx and major salivary glands. No cervical lymphadenopathy. Unremarkable thyroid gland. UPPER CHEST: Small right pleural effusion. AORTIC ARCH: There is mild calcific atherosclerosis of the aortic arch. There is no aneurysm, dissection or hemodynamically significant stenosis of the visualized ascending aorta and aortic arch. Conventional 3 vessel aortic branching pattern. The visualized proximal subclavian arteries are widely patent. RIGHT CAROTID SYSTEM: --Common carotid artery: Widely patent origin without common carotid artery dissection or aneurysm. --Internal carotid artery: No dissection, occlusion or aneurysm. Mild atherosclerotic calcification at the carotid bifurcation without hemodynamically significant stenosis. --External carotid artery: No acute abnormality. LEFT CAROTID SYSTEM: --Common carotid artery: Widely patent origin without common carotid artery dissection or aneurysm. --Internal carotid artery: No dissection, occlusion or aneurysm. Mild atherosclerotic calcification at the carotid bifurcation without hemodynamically significant stenosis. --External carotid artery: No acute abnormality. VERTEBRAL ARTERIES: Left dominant configuration. Both origins are normal. No dissection,  occlusion or flow-limiting stenosis to the vertebrobasilar confluence. CTA HEAD FINDINGS ANTERIOR CIRCULATION: --Intracranial internal carotid arteries: Normal. --Anterior cerebral arteries: Normal. Absent right A1 segment, normal variant --Middle cerebral arteries: Mild narrowing of both M1 segments. --Posterior communicating arteries: Absent bilaterally. POSTERIOR CIRCULATION: --Basilar artery: Normal. --Posterior cerebral arteries: Normal. --Superior cerebellar arteries: Normal. --Inferior cerebellar arteries: Normal anterior and posterior inferior cerebellar arteries. VENOUS SINUSES: As permitted by contrast timing, patent. ANATOMIC VARIANTS: None DELAYED PHASE: No parenchymal contrast  enhancement. Review of the MIP images confirms the above findings. CT Brain Perfusion Findings: CBF (<30%) Volume: 50mL Perfusion (Tmax>6.0s) volume: 82mL Mismatch Volume: 31mL Infarction Location:Posterior right MCA territory IMPRESSION: 1. No emergent large vessel occlusion. 2. 22 mL posterior right MCA territory area of ischemia by CT perfusion criteria. No demonstrated core infarct. 3. Mild bilateral M1 segment narrowing of the middle cerebral arteries. 4.  Aortic atherosclerosis (ICD10-I70.0). 5. Incompletely visualized right pleural effusion. Electronically Signed   By: Ulyses Jarred M.D.   On: 08/03/2018 21:30   Ct Head Code Stroke Wo Contrast  Result Date: 08/03/2018 CLINICAL DATA:  Code stroke. 84 year female with slurred speech, leg weakness and numbness. Last known well 1545 hours. EXAM: CT HEAD WITHOUT CONTRAST TECHNIQUE: Contiguous axial images were obtained from the base of the skull through the vertex without intravenous contrast. COMPARISON:  Head CT without contrast 05/23/2018 and earlier. FINDINGS: Brain: Stable cerebral volume. Stable ventricle size and configuration. Heterogeneous hypodensity in the bilateral cerebral white matter, deep gray matter, and right cerebellum compatible with chronic small vessel  disease appears stable since September. Small area of left occipital pole cortical encephalomalacia is stable. No midline shift, mass effect, evidence of mass lesion, intracranial hemorrhage or evidence of cortically based acute infarction. Vascular: Calcified atherosclerosis at the skull base. No suspicious intracranial vascular hyperdensity. Skull: No acute osseous abnormality identified. Sinuses/Orbits: Visualized paranasal sinuses and mastoids are stable and well pneumatized. Other: Stable orbit and scalp soft tissues. ASPECTS [facility] Stroke Program Early CT Score) - Ganglionic level infarction (caudate, lentiform nuclei, internal capsule, insula, M1-M3 cortex): 7 - Supraganglionic infarction (M4-M6 cortex): 3 Total score (0-10 with 10 being normal): 10 IMPRESSION: 1. No acute intracranial hemorrhage or acute cortically based infarct identified. ASPECTS is 10. 2. Chronic ischemic disease appears stable since September. 3. These results were communicated to Dr. Rory Percy at 5:06 pmon 11/12/2019by text page via the [facility] messaging system. Electronically Signed   By: Genevie Ann M.D.   On: 08/03/2018 17:07    PHYSICAL EXAM Frail elderly lady not in distress . Afebrile. Head is nontraumatic. Neck is supple without bruit.    Cardiac exam no murmur or gallop. Lungs are clear to auscultation. Distal pulses are well felt. Neurological Exam :  Awake alert oriented 2. Speech is slightly slow and hesitant but no dysarthria able to name and repeat well. Diminished attention and recall. Extraocular moments are full range without nystagmus. Blinks to threat bilaterally. Face is symmetric. Tongue is midline. Motor system exam no upper extremity drift with symmetric strength. Mild proximal bilateral lower extremity weakness. Diminished fine finger movements on the left. Mild left grip weakness compared to the right. Deep tendon concern depressed symmetric bilaterally. Plantars downgoing. Gait not tested. ASSESSMENT/PLAN Ms.  KENZLY ROGOFF is a 84 y.o. female with history of AF not on AC d/t falls, CHF, COPD on home oxygen, dementia, history of depression, history of stroke with no residual deficits, right shoulder pain presenting with slurred speech and B LE weakness. Developed L sided weakness and neglect. Taken for CTA with shows ischemia but no LVO. Not an IR candidate.   Stroke:   Right MCA embolic infarct secondary to known atrial fibrillation not on anticoagulation due to fall risk, dementia  Code Stroke CT head No acute stroke. Small vessel disease. ASPECTS 10.     CTA head & neck no LVO.  Mild bilateral M1 narrowing.  Aortic atherosclerosis.  Incompletely visualized right pleural effusion.  CT perfusion 22 mL  right MCA ischemia.  No demonstrated core infarct.  2D Echo pending  LDL 62  HgbA1c 5.8  SCDs for VTE prophylaxis  No antithrombotic prior to admission, now on aspirin 300 mg suppository daily.  Given n.p.o. status, continue aspirin suppository at this time.  Therapy recommendations: Pending  Disposition: Pending (admitted from [facility])  Dysphagia secondary to stroke  Diet Order            Diet Heart Room service appropriate? Yes; Fluid consistency: Thin  Diet effective now             Speech therapy to assess  atrial Fibrillation  Home anticoagulation:  None due to history of falls she was taken off the direct oral anticoagulant she was on  Given signs of stroke, currently not a anticoagulation candidate  Hypertension  Stable . Permissive hypertension (OK if < 220/120) but gradually normalize in 5-7 days . Long-term BP goal normotensive  Hyperlipidemia  Home meds:  pravachol 40, resumed in hospital  LDL 62, goal < 70  Continue statin at discharge  Pre-Diabetes  HgbA1c 5.8  Other Stroke Risk Factors  Advanced age  UDS / ETOH level not performed   Overweight, Body mass index is 28.02 kg/m., recommend weight loss, diet and exercise as appropriate   Hx  stroke/TIA  01/09/2017-incidental - subacute small left occipital lobe  08/2016-TIA:  Posterior circulation likely top of the basilar syndrome - likely embolic secondary to atrial fibrillation and inconsistent timing of administration of xarelto  Congestive heart failure  Other Active Problems  Baseline dementia   COPD on home O2   Tachy brady syndrome s/p MDT [facility] day # 0  Burnetta Sabin, MSN, APRN, ANVP-BC, AGPCNP-BC Advanced Practice Stroke Nurse [place] for Schedule & Pager information 08/04/2018 11:22 AM  I have personally examined this patient, reviewed notes, independently viewed imaging studies, participated in medical decision making and plan of care.ROS completed by me personally and pertinent positives fully documented  I have made any additions or clarifications directly to the above note. Agree with note above. She is present with embolic small right MCA infarct secondary to atrial fibrillation. She has had previous strokes and has not been a good candidate for anticoagulation given her dementia and fall risk. Recommend continue ongoing stroke workup. Continue antiplatelet therapy. No family available at the bedside for discussion. Greater than 50% time during this 25 minute visit was spent on counseling and coordination of care about embolic stroke and answering questions.  Antony Contras, MD Medical Director [facility] Pager: [phone] 08/04/2018 4:57 PM  To contact Stroke Continuity provider, please refer to [email or web address] After hours, contact General Neurology

## 2018-08-04 NOTE — ED Notes (Signed)
Offered pt lunch tray, she refused stating "im too upset to eat right now".

## 2018-08-04 NOTE — ED Notes (Signed)
Family arrived at bedside.

## 2018-08-04 NOTE — ED Notes (Signed)
PT at the bedside working with patient

## 2018-08-04 NOTE — ED Notes (Signed)
Admitting provider at bedside.

## 2018-08-04 NOTE — Progress Notes (Signed)
  Echocardiogram 2D Echocardiogram has been performed.  Madelaine Etienne 08/04/2018, 2:16 PM

## 2018-08-04 NOTE — Evaluation (Signed)
Physical Therapy Evaluation Patient Details Name: Raven Gray MRN: [id number] DOB: 02/16/1928 Today's Date: 08/04/2018   History of Present Illness  Pt is an 84 y/o female admitted secondary to L sided weakness. Perfusion imaging revealed R MCA infarction. PMH includes COPD on 2L, a fib, dCHF, CVA, dementia, s/p pacemaker and gallbladder carcinoma.   Clinical Impression  Pt admitted secondary to problem above with deficits below. Pt presenting with L sided weakness, decreased coordination on L, and cognitive deficits. Required max A to perform basic bed mobility tasks and required mod A to maintain sitting balance on edge of stretcher. No family present during session to discuss plan of care, so will need to be addressed during next session. Will continue to follow acutely to maximize functional mobility independence and safety.     Follow Up Recommendations SNF;Supervision/Assistance - 24 hour    Equipment Recommendations  None recommended by PT    Recommendations for Other Services OT consult     Precautions / Restrictions Precautions Precautions: Fall Restrictions Weight Bearing Restrictions: No      Mobility  Bed Mobility Overal bed mobility: Needs Assistance Bed Mobility: Supine to Sit;Sit to Supine     Supine to sit: Max assist Sit to supine: Max assist   General bed mobility comments: Max A for trunk elevation and LE assist. Pt requiring mod A to maintain sitting balance on edge of stretcher. Did not have +2 assist, so further mobility deferred. Max A to return to supine. Difficulty lifting LLE back onto stretcher.   Transfers                    Ambulation/Gait                Stairs            Wheelchair Mobility    Modified Rankin (Stroke Patients Only) Modified Rankin (Stroke Patients Only) Pre-Morbid Rankin Score: Moderately severe disability Modified Rankin: Moderately severe disability     Balance Overall balance assessment: Needs  assistance Sitting-balance support: Bilateral upper extremity supported;Feet unsupported Sitting balance-Leahy Scale: Poor Sitting balance - Comments: Reliant on mod A to maintain sitting balance                                      Pertinent Vitals/Pain Pain Assessment: No/denies pain    Home Living Family/patient expects to be discharged to:: Private residence Living Arrangements: Children Available Help at Discharge: Family;Available 24 hours/day Type of Home: House Home Access: Ramped entrance;Stairs to enter     Home Layout: One level Home Equipment: Clinical cytogeneticist - 2 wheels;Cane - single point;Bedside commode;Wheelchair - manual;Tub bench;Walker - 4 wheels Additional Comments: Info gained from previous notes as pt reporting conflicting information.     Prior Function Level of Independence: Needs assistance   Gait / Transfers Assistance Needed: Reports she ambulated with RW because she had multiple falls.   ADL's / Homemaking Assistance Needed: needed assist with bathing from daughter.         Hand Dominance   Dominant Hand: Right    Extremity/Trunk Assessment   Upper Extremity Assessment Upper Extremity Assessment: Defer to OT evaluation(LUE weakness noted; inconsistent command following to move L)    Lower Extremity Assessment Lower Extremity Assessment: LLE deficits/detail LLE Deficits / Details: LUE weakness noted. Able to perform partial heel slide and very limited movement at ankle. PROM WFL throughout LLE  Sensation: decreased light touch LLE Coordination: decreased gross motor    Cervical / Trunk Assessment Cervical / Trunk Assessment: Kyphotic  Communication   Communication: Expressive difficulties  Cognition Arousal/Alertness: Awake/alert Behavior During Therapy: WFL for tasks assessed/performed Overall Cognitive Status: No family/caregiver present to determine baseline cognitive functioning                                  General Comments: Pt with inconsistent command following throughout session. Per chart, pt with history of dementia.       General Comments General comments (skin integrity, edema, etc.): Noted some possible inattention to L extremities during functional mobility tasks. Will need further assessment.     Exercises     Assessment/Plan    PT Assessment Patient needs continued PT services  PT Problem List Decreased strength;Decreased balance;Decreased mobility;Decreased coordination;Decreased cognition;Decreased knowledge of use of DME;Decreased knowledge of precautions;Impaired sensation       PT Treatment Interventions DME instruction;Gait training;Functional mobility training;Therapeutic activities;Therapeutic exercise;Balance training;Patient/family education;Neuromuscular re-education;Cognitive remediation    PT Goals (Current goals can be found in the Care Plan section)  Acute Rehab PT Goals PT Goal Formulation: Patient unable to participate in goal setting Time For Goal Achievement: 08/18/18 Potential to Achieve Goals: Fair    Frequency Min 3X/week   Barriers to discharge        Co-evaluation               AM-PAC PT "6 Clicks" Daily Activity  Outcome Measure Difficulty turning over in bed (including adjusting bedclothes, sheets and blankets)?: Unable Difficulty moving from lying on back to sitting on the side of the bed? : Unable Difficulty sitting down on and standing up from a chair with arms (e.g., wheelchair, bedside commode, etc,.)?: Unable Help needed moving to and from a bed to chair (including a wheelchair)?: Total Help needed walking in hospital room?: Total Help needed climbing 3-5 steps with a railing? : Total 6 Click Score: 6    End of Session   Activity Tolerance: Patient tolerated treatment well Patient left: in bed;with call bell/phone within reach Nurse Communication: Mobility status PT Visit Diagnosis: Hemiplegia and  hemiparesis;Unsteadiness on feet (R26.81);Muscle weakness (generalized) (M62.81);History of falling (Z91.81);Repeated falls (R29.6) Hemiplegia - Right/Left: Left Hemiplegia - dominant/non-dominant: Non-dominant Hemiplegia - caused by: Cerebral infarction    Time: 6195-0932 PT Time Calculation (min) (ACUTE ONLY): 13 min   Charges:   PT Evaluation $PT Eval Moderate Complexity: 1 Mod          Leighton Ruff, PT, DPT  Acute Rehabilitation Services  Pager: [phone] Office: [phone]   Rudean Hitt 08/04/2018, 12:33 PM

## 2018-08-04 NOTE — ED Notes (Signed)
Neurology at the bedside

## 2018-08-04 NOTE — Progress Notes (Signed)
[facility] PROGRESS NOTE  Raven Gray FXT:[id number] DOB: 1928/03/03 DOA: 08/03/2018 PCP: Janie Morning, DO  Assessment/Plan:  Stroke [facility]):  CT angiogram of neck and head is negative for LVO.  CT of the brain perfusion showed 22 mm of right MCA infarction.  Neurology, Dr. Leonel Ramsay was consulted. No events on tele. HgA1c 5.8, lipid panel within limits of normal. - appreciate Neurology's Recs.  -  ASA  - on pravastatin - follow 2D transthoracic echocardiography  -PT recommending snf or 24 hour care  Essential hypertension: -fair control -continue to hold amlodipine to allow permissive hypertension -Continue metoprolol which is for atrial fibrillation -Continue Lasix which is a for CHF -IV hydralazine PRN for SBP>220 or dBP>120  GERD: -Protonix  Chronic Atrial Fibrillation: CHA2DS2-VASc Score is 7, needs oral anticoagulation, but pt is off AC due to hight risks of fall. Heart rate is well controlled. -continue metoprolol  Chronic diastolic CHF (congestive heart failure): BMP 635.6, on admission. Chest xray with heart  markedly enlarged. Vascular congestion. Diffuse interstitial edema. No pneumothorax. Hiatal hernia is unchanged. No pneumothorax. Single lead left subclavian pacemaker device is partially imaged.  On exam trace leg edema, no worsening shortness of breath.  Due to acute stroke diuretics not increased. -Continue home dose Lasix -daily weight -intake and output -follow echo  Chronic respiratory failure with hypoxia due to COPD: stable -Continue bronchodilators.  HLD (hyperlipidemia): -Pravastatin  Primary gall bladder adenocarcinoma ([place]): f/u with Dr. Burr Medico. S/p of cholecystectomy. CTabdomenscan did not show evidence of metastatic disease.Given heradvanced age, and significant medicalcomorbodities. No chemo was recommended by oncologist. -f/u with Dr. Burr Medico  Thrombocytopenia [facility]): Platelet 42. This is a new issue.  Unclear etiology.  No  bleeding tendency. - LDH and peripheral smear are pending -monitor  Acute metabolic encephalopathy: Has resolved.  Mental status comes back to baseline.  Currently patient is oriented x3.  Likely due to stroke - Frequent neuro check.   Code Status: dnr Family Communication: none present disposition Plan: may need placement   Consultants:  [facility] neurology  Procedures:  echo  Antibiotics:  none  HPI/Subjective: Raven Gray is a 84 y.o. female with medical history significant of hypertension, hyperlipidemia, COPD on 2 L nasal cannula oxygen, stroke, GERD, anxiety, tachycardia-bradycardia syndrome, pacemaker placement, gallbladder cancer, atrial fibrillation not on anticoagulant due to fall, CHF with, PE not on anticoagulants, who presented with left-sided weakness, numbness, slurred speech, confusion and dizziness. CT A/P revealed ischemia but no large vessel occlusion.  She reports no discomfort.   Objective: Vitals:   08/04/18 1030 08/04/18 1100  BP: (!) 165/78 (!) 147/70  Pulse: 64 (!) 59  Resp: 20 19  Temp:    SpO2: 96% 93%   No intake or output data in the 24 hours ending 08/04/18 1226 Filed Weights   08/03/18 1701  Weight: 71.8 kg    Exam:   General:  Awake alert oriented to self and place  Cardiovascular: irregularly irregular no mgr trace LE edema  Neuro: alert oriented to self speech clear left grip 1/5 right grip 5/5 right leg strength 1/5. Speech clear  Respiratory: normal effort respirations somewhat shallow BS distant with fine crackles no wheeze  Abdomen: obese soft +BS non-tender to palpation no guarding or reboundig  Musculoskeletal: joints without swelling/erythema    Data Reviewed: Basic Metabolic Panel: Recent Labs  Lab 08/03/18 1642 08/03/18 1653  NA 140 140  K 3.9 3.9  CL 101 98  CO2 32  --   GLUCOSE 104*  101*  BUN 11 14  CREATININE 1.07* 1.00  CALCIUM 8.9  --    Liver Function Tests: Recent Labs  Lab 08/03/18 1642   AST 28  ALT 13  ALKPHOS 84  BILITOT 0.9  PROT 6.7  ALBUMIN 3.4*   No results for input(s): LIPASE, AMYLASE in the last 168 hours. No results for input(s): AMMONIA in the last 168 hours. CBC: Recent Labs  Lab 08/03/18 1653 08/03/18 1741  WBC  --  5.2  NEUTROABS  --  2.9  HGB 15.3* 14.2  HCT 45.0 47.1*  MCV  --  93.6  PLT  --  42*   Cardiac Enzymes: No results for input(s): CKTOTAL, CKMB, CKMBINDEX, TROPONINI in the last 168 hours. BNP (last 3 results) Recent Labs    11/06/17 0946 03/29/18 0131 08/03/18 1744  BNP 450.1* 811.5* 635.6*    ProBNP (last 3 results) No results for input(s): PROBNP in the last 8760 hours.  CBG: Recent Labs  Lab 08/03/18 1648  GLUCAP 91    No results found for this or any previous visit (from the past 240 hour(s)).   Studies: Ct Angio Head W Or Wo Contrast  Result Date: 08/03/2018 CLINICAL DATA:  Left-sided deficits EXAM: CT ANGIOGRAPHY HEAD AND NECK CT PERFUSION BRAIN TECHNIQUE: Multidetector CT imaging of the head and neck was performed using the standard protocol during bolus administration of intravenous contrast. Multiplanar CT image reconstructions and MIPs were obtained to evaluate the vascular anatomy. Carotid stenosis measurements (when applicable) are obtained utilizing NASCET criteria, using the distal internal carotid diameter as the denominator. Multiphase CT imaging of the brain was performed following IV bolus contrast injection. Subsequent parametric perfusion maps were calculated using RAPID software. CONTRAST:  125mL ISOVUE-370 IOPAMIDOL (ISOVUE-370) INJECTION 76% COMPARISON:  None. FINDINGS: CTA NECK FINDINGS SKELETON: There is no bony spinal canal stenosis. No lytic or blastic lesion. OTHER NECK: Normal pharynx, larynx and major salivary glands. No cervical lymphadenopathy. Unremarkable thyroid gland. UPPER CHEST: Small right pleural effusion. AORTIC ARCH: There is mild calcific atherosclerosis of the aortic arch. There is  no aneurysm, dissection or hemodynamically significant stenosis of the visualized ascending aorta and aortic arch. Conventional 3 vessel aortic branching pattern. The visualized proximal subclavian arteries are widely patent. RIGHT CAROTID SYSTEM: --Common carotid artery: Widely patent origin without common carotid artery dissection or aneurysm. --Internal carotid artery: No dissection, occlusion or aneurysm. Mild atherosclerotic calcification at the carotid bifurcation without hemodynamically significant stenosis. --External carotid artery: No acute abnormality. LEFT CAROTID SYSTEM: --Common carotid artery: Widely patent origin without common carotid artery dissection or aneurysm. --Internal carotid artery: No dissection, occlusion or aneurysm. Mild atherosclerotic calcification at the carotid bifurcation without hemodynamically significant stenosis. --External carotid artery: No acute abnormality. VERTEBRAL ARTERIES: Left dominant configuration. Both origins are normal. No dissection, occlusion or flow-limiting stenosis to the vertebrobasilar confluence. CTA HEAD FINDINGS ANTERIOR CIRCULATION: --Intracranial internal carotid arteries: Normal. --Anterior cerebral arteries: Normal. Absent right A1 segment, normal variant --Middle cerebral arteries: Mild narrowing of both M1 segments. --Posterior communicating arteries: Absent bilaterally. POSTERIOR CIRCULATION: --Basilar artery: Normal. --Posterior cerebral arteries: Normal. --Superior cerebellar arteries: Normal. --Inferior cerebellar arteries: Normal anterior and posterior inferior cerebellar arteries. VENOUS SINUSES: As permitted by contrast timing, patent. ANATOMIC VARIANTS: None DELAYED PHASE: No parenchymal contrast enhancement. Review of the MIP images confirms the above findings. CT Brain Perfusion Findings: CBF (<30%) Volume: 63mL Perfusion (Tmax>6.0s) volume: 73mL Mismatch Volume: 16mL Infarction Location:Posterior right MCA territory IMPRESSION: 1. No  emergent large vessel occlusion. 2. 22  mL posterior right MCA territory area of ischemia by CT perfusion criteria. No demonstrated core infarct. 3. Mild bilateral M1 segment narrowing of the middle cerebral arteries. 4.  Aortic atherosclerosis (ICD10-I70.0). 5. Incompletely visualized right pleural effusion. Electronically Signed   By: Ulyses Jarred M.D.   On: 08/03/2018 21:30   Dg Chest 2 View  Result Date: 08/03/2018 CLINICAL DATA:  Slurred speech EXAM: CHEST - 2 VIEW COMPARISON:  05/23/2018 FINDINGS: The heart is markedly enlarged. Vascular congestion. Diffuse interstitial edema. No pneumothorax. Hiatal hernia is unchanged. No pneumothorax. Single lead left subclavian pacemaker device is partially imaged. IMPRESSION: CHF with interstitial edema. Electronically Signed   By: Marybelle Killings M.D.   On: 08/03/2018 18:12   Ct Angio Neck W Or Wo Contrast  Result Date: 08/03/2018 CLINICAL DATA:  Left-sided deficits EXAM: CT ANGIOGRAPHY HEAD AND NECK CT PERFUSION BRAIN TECHNIQUE: Multidetector CT imaging of the head and neck was performed using the standard protocol during bolus administration of intravenous contrast. Multiplanar CT image reconstructions and MIPs were obtained to evaluate the vascular anatomy. Carotid stenosis measurements (when applicable) are obtained utilizing NASCET criteria, using the distal internal carotid diameter as the denominator. Multiphase CT imaging of the brain was performed following IV bolus contrast injection. Subsequent parametric perfusion maps were calculated using RAPID software. CONTRAST:  11mL ISOVUE-370 IOPAMIDOL (ISOVUE-370) INJECTION 76% COMPARISON:  None. FINDINGS: CTA NECK FINDINGS SKELETON: There is no bony spinal canal stenosis. No lytic or blastic lesion. OTHER NECK: Normal pharynx, larynx and major salivary glands. No cervical lymphadenopathy. Unremarkable thyroid gland. UPPER CHEST: Small right pleural effusion. AORTIC ARCH: There is mild calcific atherosclerosis  of the aortic arch. There is no aneurysm, dissection or hemodynamically significant stenosis of the visualized ascending aorta and aortic arch. Conventional 3 vessel aortic branching pattern. The visualized proximal subclavian arteries are widely patent. RIGHT CAROTID SYSTEM: --Common carotid artery: Widely patent origin without common carotid artery dissection or aneurysm. --Internal carotid artery: No dissection, occlusion or aneurysm. Mild atherosclerotic calcification at the carotid bifurcation without hemodynamically significant stenosis. --External carotid artery: No acute abnormality. LEFT CAROTID SYSTEM: --Common carotid artery: Widely patent origin without common carotid artery dissection or aneurysm. --Internal carotid artery: No dissection, occlusion or aneurysm. Mild atherosclerotic calcification at the carotid bifurcation without hemodynamically significant stenosis. --External carotid artery: No acute abnormality. VERTEBRAL ARTERIES: Left dominant configuration. Both origins are normal. No dissection, occlusion or flow-limiting stenosis to the vertebrobasilar confluence. CTA HEAD FINDINGS ANTERIOR CIRCULATION: --Intracranial internal carotid arteries: Normal. --Anterior cerebral arteries: Normal. Absent right A1 segment, normal variant --Middle cerebral arteries: Mild narrowing of both M1 segments. --Posterior communicating arteries: Absent bilaterally. POSTERIOR CIRCULATION: --Basilar artery: Normal. --Posterior cerebral arteries: Normal. --Superior cerebellar arteries: Normal. --Inferior cerebellar arteries: Normal anterior and posterior inferior cerebellar arteries. VENOUS SINUSES: As permitted by contrast timing, patent. ANATOMIC VARIANTS: None DELAYED PHASE: No parenchymal contrast enhancement. Review of the MIP images confirms the above findings. CT Brain Perfusion Findings: CBF (<30%) Volume: 71mL Perfusion (Tmax>6.0s) volume: 49mL Mismatch Volume: 53mL Infarction Location:Posterior right MCA  territory IMPRESSION: 1. No emergent large vessel occlusion. 2. 22 mL posterior right MCA territory area of ischemia by CT perfusion criteria. No demonstrated core infarct. 3. Mild bilateral M1 segment narrowing of the middle cerebral arteries. 4.  Aortic atherosclerosis (ICD10-I70.0). 5. Incompletely visualized right pleural effusion. Electronically Signed   By: Ulyses Jarred M.D.   On: 08/03/2018 21:30   Ct Cerebral Perfusion W Contrast  Result Date: 08/03/2018 CLINICAL DATA:  Left-sided deficits EXAM: CT  ANGIOGRAPHY HEAD AND NECK CT PERFUSION BRAIN TECHNIQUE: Multidetector CT imaging of the head and neck was performed using the standard protocol during bolus administration of intravenous contrast. Multiplanar CT image reconstructions and MIPs were obtained to evaluate the vascular anatomy. Carotid stenosis measurements (when applicable) are obtained utilizing NASCET criteria, using the distal internal carotid diameter as the denominator. Multiphase CT imaging of the brain was performed following IV bolus contrast injection. Subsequent parametric perfusion maps were calculated using RAPID software. CONTRAST:  169mL ISOVUE-370 IOPAMIDOL (ISOVUE-370) INJECTION 76% COMPARISON:  None. FINDINGS: CTA NECK FINDINGS SKELETON: There is no bony spinal canal stenosis. No lytic or blastic lesion. OTHER NECK: Normal pharynx, larynx and major salivary glands. No cervical lymphadenopathy. Unremarkable thyroid gland. UPPER CHEST: Small right pleural effusion. AORTIC ARCH: There is mild calcific atherosclerosis of the aortic arch. There is no aneurysm, dissection or hemodynamically significant stenosis of the visualized ascending aorta and aortic arch. Conventional 3 vessel aortic branching pattern. The visualized proximal subclavian arteries are widely patent. RIGHT CAROTID SYSTEM: --Common carotid artery: Widely patent origin without common carotid artery dissection or aneurysm. --Internal carotid artery: No dissection,  occlusion or aneurysm. Mild atherosclerotic calcification at the carotid bifurcation without hemodynamically significant stenosis. --External carotid artery: No acute abnormality. LEFT CAROTID SYSTEM: --Common carotid artery: Widely patent origin without common carotid artery dissection or aneurysm. --Internal carotid artery: No dissection, occlusion or aneurysm. Mild atherosclerotic calcification at the carotid bifurcation without hemodynamically significant stenosis. --External carotid artery: No acute abnormality. VERTEBRAL ARTERIES: Left dominant configuration. Both origins are normal. No dissection, occlusion or flow-limiting stenosis to the vertebrobasilar confluence. CTA HEAD FINDINGS ANTERIOR CIRCULATION: --Intracranial internal carotid arteries: Normal. --Anterior cerebral arteries: Normal. Absent right A1 segment, normal variant --Middle cerebral arteries: Mild narrowing of both M1 segments. --Posterior communicating arteries: Absent bilaterally. POSTERIOR CIRCULATION: --Basilar artery: Normal. --Posterior cerebral arteries: Normal. --Superior cerebellar arteries: Normal. --Inferior cerebellar arteries: Normal anterior and posterior inferior cerebellar arteries. VENOUS SINUSES: As permitted by contrast timing, patent. ANATOMIC VARIANTS: None DELAYED PHASE: No parenchymal contrast enhancement. Review of the MIP images confirms the above findings. CT Brain Perfusion Findings: CBF (<30%) Volume: 57mL Perfusion (Tmax>6.0s) volume: 33mL Mismatch Volume: 75mL Infarction Location:Posterior right MCA territory IMPRESSION: 1. No emergent large vessel occlusion. 2. 22 mL posterior right MCA territory area of ischemia by CT perfusion criteria. No demonstrated core infarct. 3. Mild bilateral M1 segment narrowing of the middle cerebral arteries. 4.  Aortic atherosclerosis (ICD10-I70.0). 5. Incompletely visualized right pleural effusion. Electronically Signed   By: Ulyses Jarred M.D.   On: 08/03/2018 21:30   Ct Head  Code Stroke Wo Contrast  Result Date: 08/03/2018 CLINICAL DATA:  Code stroke. 84 year female with slurred speech, leg weakness and numbness. Last known well 1545 hours. EXAM: CT HEAD WITHOUT CONTRAST TECHNIQUE: Contiguous axial images were obtained from the base of the skull through the vertex without intravenous contrast. COMPARISON:  Head CT without contrast 05/23/2018 and earlier. FINDINGS: Brain: Stable cerebral volume. Stable ventricle size and configuration. Heterogeneous hypodensity in the bilateral cerebral white matter, deep gray matter, and right cerebellum compatible with chronic small vessel disease appears stable since September. Small area of left occipital pole cortical encephalomalacia is stable. No midline shift, mass effect, evidence of mass lesion, intracranial hemorrhage or evidence of cortically based acute infarction. Vascular: Calcified atherosclerosis at the skull base. No suspicious intracranial vascular hyperdensity. Skull: No acute osseous abnormality identified. Sinuses/Orbits: Visualized paranasal sinuses and mastoids are stable and well pneumatized. Other: Stable orbit and scalp  soft tissues. ASPECTS [facility] Stroke Program Early CT Score) - Ganglionic level infarction (caudate, lentiform nuclei, internal capsule, insula, M1-M3 cortex): 7 - Supraganglionic infarction (M4-M6 cortex): 3 Total score (0-10 with 10 being normal): 10 IMPRESSION: 1. No acute intracranial hemorrhage or acute cortically based infarct identified. ASPECTS is 10. 2. Chronic ischemic disease appears stable since September. 3. These results were communicated to Dr. Rory Percy at 5:06 pmon 11/12/2019by text page via the [facility] messaging system. Electronically Signed   By: Genevie Ann M.D.   On: 08/03/2018 17:07    Scheduled Meds: .  stroke: mapping our early stages of recovery book   Does not apply Once  . aspirin  300 mg Rectal Daily  . [START ON 08/05/2018] furosemide  40 mg Oral Q T,Th,S,Su   And  . furosemide  20  mg Oral Q M,W,F  . ipratropium-albuterol  3 mL Nebulization Q6H  . magnesium oxide  400 mg Oral QHS  . Melatonin  3 mg Oral QHS  . metoprolol tartrate  12.5 mg Oral BID  . montelukast  10 mg Oral QHS  . pantoprazole  40 mg Oral Daily  . pravastatin  40 mg Oral Daily  . traZODone  100 mg Oral QHS  . venlafaxine XR  225 mg Oral Q breakfast   Continuous Infusions:  Principal Problem:   Stroke [facility]) Active Problems:   Acute metabolic encephalopathy   Chronic atrial fibrillation   Essential hypertension   GERD (gastroesophageal reflux disease)   Chronic respiratory failure with hypoxia ([facility])   Pacemaker   Chronic diastolic CHF (congestive heart failure) ([facility])   COPD with emphysema ([facility])   HLD (hyperlipidemia)   Thrombocytopenia ([place])   Primary gall bladder adenocarcinoma ([place])    Time spent: 40 minutes    Tropic NP [facility]  If 7PM-7AM, please contact night-coverage at [email or web address], password [facility] 08/04/2018, 12:26 PM  LOS: 0 days

## 2018-08-04 NOTE — ED Notes (Signed)
Daughter called spoke with patient on phone.

## 2018-08-05 ENCOUNTER — Inpatient Hospital Stay (HOSPITAL_COMMUNITY): Payer: Medicare Other

## 2018-08-05 DIAGNOSIS — E785 Hyperlipidemia, unspecified: Secondary | ICD-10-CM

## 2018-08-05 DIAGNOSIS — I482 Chronic atrial fibrillation, unspecified: Secondary | ICD-10-CM

## 2018-08-05 DIAGNOSIS — Z950 Presence of cardiac pacemaker: Secondary | ICD-10-CM

## 2018-08-05 DIAGNOSIS — K219 Gastro-esophageal reflux disease without esophagitis: Secondary | ICD-10-CM

## 2018-08-05 DIAGNOSIS — C23 Malignant neoplasm of gallbladder: Secondary | ICD-10-CM

## 2018-08-05 DIAGNOSIS — I10 Essential (primary) hypertension: Secondary | ICD-10-CM

## 2018-08-05 DIAGNOSIS — G9341 Metabolic encephalopathy: Secondary | ICD-10-CM

## 2018-08-05 DIAGNOSIS — J9611 Chronic respiratory failure with hypoxia: Secondary | ICD-10-CM

## 2018-08-05 DIAGNOSIS — D696 Thrombocytopenia, unspecified: Secondary | ICD-10-CM

## 2018-08-05 DIAGNOSIS — I5032 Chronic diastolic (congestive) heart failure: Secondary | ICD-10-CM

## 2018-08-05 LAB — CBC
HCT: 40.9 % (ref 36.0–46.0)
HEMOGLOBIN: 13 g/dL (ref 12.0–15.0)
MCH: 28.4 pg (ref 26.0–34.0)
MCHC: 31.8 g/dL (ref 30.0–36.0)
MCV: 89.3 fL (ref 80.0–100.0)
Platelets: 144 10*3/uL — ABNORMAL LOW (ref 150–400)
RBC: 4.58 MIL/uL (ref 3.87–5.11)
RDW: 14.6 % (ref 11.5–15.5)
WBC: 5.7 10*3/uL (ref 4.0–10.5)
nRBC: 0 % (ref 0.0–0.2)

## 2018-08-05 MED ORDER — ASPIRIN 325 MG PO TABS
325.0000 mg | ORAL_TABLET | Freq: Every day | ORAL | Status: DC
  Administered 2018-08-05 – 2018-08-06 (×2): 325 mg via ORAL
  Filled 2018-08-05: qty 1

## 2018-08-05 NOTE — Evaluation (Signed)
Speech Language Pathology Evaluation Patient Details Name: Raven Gray MRN: [id number] DOB: February 21, 1928 Today's Date: 08/05/2018 Time: 7564-3329 SLP Time Calculation (min) (ACUTE ONLY): 15 min  Problem List:  Patient Active Problem List   Diagnosis Date Noted  . Stroke ([place]) 08/03/2018  . Thrombocytopenia ([place]) 08/03/2018  . Pre-syncope 05/23/2018  . Primary gall bladder adenocarcinoma ([place]) 04/18/2018  . Acute cholecystitis 03/29/2018  . Unilateral primary osteoarthritis, left knee   . Palliative care by specialist   . Epistaxis   . Syncope 11/02/2017  . Permanent atrial fibrillation 11/02/2017  . Nasal fracture 11/02/2017  . Fall at home 11/02/2017  . COPD with emphysema ([place]) 11/02/2017  . HLD (hyperlipidemia) 11/02/2017  . History of pulmonary embolism 07/21/2017  . Nonsustained ventricular tachycardia ([place]) 07/21/2017  . Healthcare-associated pneumonia 07/01/2017  . Acute respiratory failure with hypoxia ([place]) 07/01/2017  . Chronic diastolic CHF (congestive heart failure) ([place]) 07/01/2017  . Conjunctivitis 07/01/2017  . Sepsis ([place]) 07/01/2017  . HCAP (healthcare-associated pneumonia)   . Acute pulmonary edema (HCC)   . Tachycardia-bradycardia syndrome ([place]) 04/01/2017  . Pacemaker 04/01/2017  . Chronic atrial fibrillation   . Syncope and collapse   . Loss of consciousness ([place]) 01/08/2017  . Dehydration 11/25/2016  . Acute encephalopathy   . Acute metabolic encephalopathy 51/88/4166  . Dyspnea on exertion 10/29/2016  . Chronic respiratory failure with hypoxia ([place]) 10/29/2016  . Long term current use of anticoagulant 10/23/2016  . TIA (transient ischemic attack) 09/06/2016  . COPD (chronic obstructive pulmonary disease) ([place]) 09/06/2016  . Essential hypertension 09/06/2016  . GERD (gastroesophageal reflux disease) 09/06/2016  . Depression 09/06/2016  . Hyperlipidemia 10/23/2015  . Aortic insufficiency 12/15/2014  . Edema of both legs 09/19/2014  . Chest pain  11/21/2013  . Community acquired pneumonia 06/19/2012  . Pulmonary embolism ([place]) 06/19/2012  . Hypokalemia 06/18/2012  . COPD with acute exacerbation ([place]) 06/18/2012  . Hypoxia 06/18/2012   Past Medical History:  Past Medical History:  Diagnosis Date  . Atrial fibrillation ([place])    off AC due to falls  . Chest pain   . CHF (congestive heart failure) ([place])   . Cholecystitis 03/2018  . COPD (chronic obstructive pulmonary disease) (HCC)    wears prn home O2  . Dementia ([place])   . H/O echocardiogram    a. 08/2016: EF of 60-65%, severely dilated LA, mild pulmonic regurgitations, mild TR, and PA Pressure at 38 mm Hg  . History of depression   . History of pulmonary embolism   . Hyperlipidemia   . Hypertension   . Hypokalemia    Now improved with treatment  . Pre-diabetes   . Shortness of breath   . Stroke [facility])    brainstem aneurysm(?) in 2016  . Tachy-brady syndrome ([place]) 04/01/2017   s/p MDT PPM    Past Surgical History:  Past Surgical History:  Procedure Laterality Date  . ABDOMINAL HYSTERECTOMY    . BACK SURGERY    . CHOLECYSTECTOMY N/A 03/29/2018   Procedure: LAPAROSCOPIC CHOLECYSTECTOMY WITH INTRAOPERATIVE CHOLANGIOGRAM;  Surgeon: Alphonsa Overall, MD;  Location: [facility];  Service: General;  Laterality: N/A;  . PACEMAKER IMPLANT N/A 04/01/2017   Procedure: Pacemaker Implant;  Surgeon: Sanda Klein, MD;  Location: [facility] CV LAB;  Service: Cardiovascular;  Laterality: N/A; MDT Azure XT SR MRI  . REPLACEMENT TOTAL KNEE    . ROTATOR CUFF REPAIR     HPI:  Raven Gray is a 84 y.o. female with history of dementia, tachy-brady syndrome, brainstem  stroke (2016), HTN, hyperlipdemia, COPD, SOB, atrial fibrillation, CHF. She presented to [facility] ED 08/03/18 with altered mental status and severe L weakness. Per chart, pt's daughter reported pt had increased fidgeting, was unable to stand (even with assistance) due to generalized weakness, and severely dysarthric speech (was completely  unintelligibile to daughter). Pt screened by SLP in April 2018 and determined to be at baseline functioning - no further ST was recommended. Perfusion imaging revealed R MCA infarction. CXR showed CHF with interstitial edema.    Assessment / Plan / Recommendation Clinical Impression  Pt was seen for cognitive-linguistic evaluation today following R MCA stroke. She was alert and able to engage in simple conversation, oriented X3, and followed basic 1 and 2-step commands. Pt's daughter at bedside provided information regarding pt's baseline level of functioning; she stated pt was initially more confused than would be expected during this addmission, however the pt's orientation and intellectual awareness also fluctuates drastically throughout the day. During SLPs evaluation, pt demonstrated some degree of intellectual awareness, stating "they say I had a stroke" and engage in simple converastion about family and typical daily activities, however her daughter indicated some of pt's statements were inaccurate. Pt lives with her daughter who is currently available 24hrs/day to assist with ADLs; she stated pt is only independent with feeding herself (meals are prepared by daughter). SLP provided supportive listening and education for daughter regarding progressive nature of pt's dementia dx as well as suggestions for use of external memory aids. SLP encouraged pt to continue engaging in activities she enjoys (puzzles and TV) and remain as active as possible (with assistance). Given assessment and daughter's input regarding history pt is determined to be at baseline level of cognitive functioning with pre-morbid impairments primarily with short-term and working memory and no further ST is indicated at this time.     SLP Assessment  SLP Recommendation/Assessment: Patient does not need any further Speech Lanaguage Pathology Services SLP Visit Diagnosis: Cognitive communication deficit (R41.841)    Follow Up  Recommendations  24 hour supervision/assistance    Frequency and Duration           SLP Evaluation Cognition  Overall Cognitive Status: History of cognitive impairments - at baseline Arousal/Alertness: Awake/alert Orientation Level: Oriented to person;Oriented to place;Oriented to situation Attention: Focused;Sustained Focused Attention: Appears intact Sustained Attention: Appears intact Memory: Impaired Memory Impairment: Prospective memory;Decreased short term memory;Decreased recall of new information Decreased Short Term Memory: Verbal basic Awareness: Impaired Awareness Impairment: Emergent impairment;Anticipatory impairment(suspect impaired) Problem Solving: (difficult to assess - suspect impaired) Executive Function: (difficult to assess - suspect impaired) Safety/Judgment: Impaired       Comprehension  Auditory Comprehension Overall Auditory Comprehension: Appears within functional limits for tasks assessed Yes/No Questions: Not tested Commands: Within Functional Limits Conversation: Simple Interfering Components: Working Curator: Not tested Reading Comprehension Reading Status: Not tested    Expression Expression Primary Mode of Expression: Verbal Verbal Expression Overall Verbal Expression: Appears within functional limits for tasks assessed Initiation: No impairment Level of Generative/Spontaneous Verbalization: Sentence Repetition: (NT) Naming: Not tested Pragmatics: No impairment Interfering Components: Premorbid deficit Written Expression Dominant Hand: Right Written Expression: Not tested   Oral / Motor  Oral Motor/Sensory Function Overall Oral Motor/Sensory Function: Within functional limits Motor Speech Overall Motor Speech: Appears within functional limits for tasks assessed Respiration: Within functional limits Phonation: Low vocal intensity Resonance: Within functional limits Articulation:  Within functional limitis Intelligibility: Intelligible Motor Planning: Witnin functional limits Motor Speech Errors: Not applicable  Jettie Booze, Student SLP                   Jettie Booze 08/05/2018, 2:15 PM

## 2018-08-05 NOTE — NC FL2 (Signed)
[place] LEVEL OF CARE SCREENING TOOL     IDENTIFICATION  Patient Name: Raven Gray Birthdate: 11/28/1927 Sex: female Admission Date (Current Location): 08/03/2018  [facility] and [place] Number:  Herbalist and Address:  [facility], [place] [address], [place], [place] [address]      Provider Number: [id number]  Attending Physician Name and Address:  Cristal Ford, DO  Relative Name and Phone Number:       Current Level of Care: Hospital Recommended Level of Care: [place] Prior Approval Number:    Date Approved/Denied:   PASRR Number: Manual Review  Discharge Plan: SNF    Current Diagnoses: Patient Active Problem List   Diagnosis Date Noted  . Stroke ([place]) 08/03/2018  . Thrombocytopenia ([place]) 08/03/2018  . Pre-syncope 05/23/2018  . Primary gall bladder adenocarcinoma ([place]) 04/18/2018  . Acute cholecystitis 03/29/2018  . Unilateral primary osteoarthritis, left knee   . Palliative care by specialist   . Epistaxis   . Syncope 11/02/2017  . Permanent atrial fibrillation 11/02/2017  . Nasal fracture 11/02/2017  . Fall at home 11/02/2017  . COPD with emphysema ([place]) 11/02/2017  . HLD (hyperlipidemia) 11/02/2017  . History of pulmonary embolism 07/21/2017  . Nonsustained ventricular tachycardia ([place]) 07/21/2017  . Healthcare-associated pneumonia 07/01/2017  . Acute respiratory failure with hypoxia ([place]) 07/01/2017  . Chronic diastolic CHF (congestive heart failure) ([place]) 07/01/2017  . Conjunctivitis 07/01/2017  . Sepsis ([place]) 07/01/2017  . HCAP (healthcare-associated pneumonia)   . Acute pulmonary edema (HCC)   . Tachycardia-bradycardia syndrome ([place]) 04/01/2017  . Pacemaker 04/01/2017  . Chronic atrial fibrillation   . Syncope and collapse   . Loss of consciousness ([place]) 01/08/2017  . Dehydration 11/25/2016  . Acute encephalopathy   . Acute metabolic encephalopathy 82/42/3536  . Dyspnea on exertion  10/29/2016  . Chronic respiratory failure with hypoxia ([place]) 10/29/2016  . Long term current use of anticoagulant 10/23/2016  . TIA (transient ischemic attack) 09/06/2016  . COPD (chronic obstructive pulmonary disease) ([place]) 09/06/2016  . Essential hypertension 09/06/2016  . GERD (gastroesophageal reflux disease) 09/06/2016  . Depression 09/06/2016  . Hyperlipidemia 10/23/2015  . Aortic insufficiency 12/15/2014  . Edema of both legs 09/19/2014  . Chest pain 11/21/2013  . Community acquired pneumonia 06/19/2012  . Pulmonary embolism ([place]) 06/19/2012  . Hypokalemia 06/18/2012  . COPD with acute exacerbation ([place]) 06/18/2012  . Hypoxia 06/18/2012    Orientation RESPIRATION BLADDER Height & Weight     Self, Situation, Place  O2( 2L) Continent Weight: 158 lb 3.2 oz (71.8 kg) Height:  5\' 3"  (160 cm)  BEHAVIORAL SYMPTOMS/MOOD NEUROLOGICAL BOWEL NUTRITION STATUS      Continent Diet(Heart Healthy Diet)  AMBULATORY STATUS COMMUNICATION OF NEEDS Skin   Extensive Assist Verbally Normal                       Personal Care Assistance Level of Assistance  Bathing, Feeding, Dressing Bathing Assistance: Maximum assistance Feeding assistance: Limited assistance Dressing Assistance: Maximum assistance     Functional Limitations Info  Sight, Hearing, Speech Sight Info: Adequate Hearing Info: Adequate Speech Info: Adequate    SPECIAL CARE FACTORS FREQUENCY  PT (By licensed PT), OT (By licensed OT)     PT Frequency: 5x/wk OT Frequency: 5x/wk            Contractures Contractures Info: Not present    Additional Factors Info  Code Status, Allergies, Psychotropic Code Status Info:  DNR Allergies Info: NKA Psychotropic Info: venlafaine XR 225mg  1 per day in the am         Current Medications (08/05/2018):  This is the current hospital active medication list Current Facility-Administered Medications  Medication Dose Route Frequency Provider Last Rate Last Dose  .   stroke: mapping our early stages of recovery book   Does not apply Once Ivor Costa, MD      . acetaminophen (TYLENOL) tablet 650 mg  650 mg Oral Q6H PRN Ivor Costa, MD   650 mg at 08/04/18 0836  . albuterol (PROVENTIL) (2.5 MG/3ML) 0.083% nebulizer solution 2.5 mg  2.5 mg Nebulization Q4H PRN Ivor Costa, MD      . ALPRAZolam Duanne Moron) tablet 0.25 mg  0.25 mg Oral TID PRN Ivor Costa, MD   0.25 mg at 08/04/18 1900  . aspirin tablet 325 mg  325 mg Oral Daily Mikhail, Maryann, DO      . dextromethorphan-guaiFENesin ([place] DM) 30-600 MG per 12 hr tablet 1 tablet  1 tablet Oral BID PRN Ivor Costa, MD      . furosemide (LASIX) tablet 40 mg  40 mg Oral Q T,Th,S,Su Ivor Costa, MD   40 mg at 08/05/18 1114   And  . furosemide (LASIX) tablet 20 mg  20 mg Oral Q M,W,F Ivor Costa, MD      . hydrALAZINE (APRESOLINE) injection 5 mg  5 mg Intravenous Q2H PRN Ivor Costa, MD      . hydrOXYzine (ATARAX/VISTARIL) tablet 10 mg  10 mg Oral TID PRN Ivor Costa, MD      . ipratropium-albuterol (DUONEB) 0.5-2.5 (3) MG/3ML nebulizer solution 3 mL  3 mL Nebulization Q6H Ivor Costa, MD   3 mL at 08/05/18 9629  . magnesium oxide (MAG-OX) tablet 400 mg  400 mg Oral QHS Ivor Costa, MD   400 mg at 08/04/18 2130  . Melatonin TABS 3 mg  3 mg Oral QHS Ivor Costa, MD   3 mg at 08/04/18 2131  . metoprolol tartrate (LOPRESSOR) tablet 12.5 mg  12.5 mg Oral BID Ivor Costa, MD   12.5 mg at 08/05/18 1111  . montelukast (SINGULAIR) tablet 10 mg  10 mg Oral QHS Ivor Costa, MD   10 mg at 08/04/18 2130  . nitroGLYCERIN (NITROSTAT) SL tablet 0.4 mg  0.4 mg Sublingual Q5 Min x 3 PRN Ivor Costa, MD      . pantoprazole (PROTONIX) EC tablet 40 mg  40 mg Oral Daily Ivor Costa, MD   40 mg at 08/05/18 1112  . polyethylene glycol (MIRALAX / GLYCOLAX) packet 17 g  17 g Oral BID PRN Ivor Costa, MD      . pravastatin (PRAVACHOL) tablet 40 mg  40 mg Oral Daily Ivor Costa, MD   40 mg at 08/05/18 1112  . traZODone (DESYREL) tablet 100 mg  100 mg Oral QHS  Ivor Costa, MD   100 mg at 08/04/18 2131  . venlafaxine XR (EFFEXOR-XR) 24 hr capsule 225 mg  225 mg Oral Q breakfast Ivor Costa, MD   225 mg at 08/05/18 1113  . zolpidem (AMBIEN) tablet 5 mg  5 mg Oral QHS PRN Ivor Costa, MD   5 mg at 08/05/18 0044     Discharge Medications: Please see discharge summary for a list of discharge medications.  Relevant Imaging Results:  Relevant Lab Results:   Additional Information [id number]  [place], LCSWA

## 2018-08-05 NOTE — Clinical Social Work Note (Signed)
Clinical Social Work Assessment  Patient Details  Name: Raven Gray MRN: [id number] Date of Birth: Jul 16, 1928  Date of referral:  08/05/18               Reason for consult:  Facility Placement                Permission sought to share information with:  Family Supports, Chartered certified accountant granted to share information::  Yes, Verbal Permission Granted  Name::     Editor, commissioning::  for SNF, [facility]  Relationship::  Daughter  Contact Information:     Housing/Transportation Living arrangements for the past 2 months:  Single Family Home Source of Information:  Adult Children Patient Interpreter Needed:  None Criminal Activity/Legal Involvement Pertinent to Current Situation/Hospitalization:  No - Comment as needed Significant Relationships:  Adult Children Lives with:  Adult Children, Self Do you feel safe going back to the place where you live?  Yes Need for family participation in patient care:  Yes (Comment)  Care giving concerns:  Patient is from home with daughter. Patient could benefit with ongoing care and resources from SNF.    Social Worker assessment / plan:  CSW completed FL2 assessment. CSW met with the patient and daughter in the room face to face. Patient daughter requested placement at [facility]. CSW verified with [facility] to make sure there was bed available.  CSW updated patient daughter. SNF facility has started insurance authorization.   Employment status:  Retired Nurse, adult PT Recommendations:    Information / Referral to community resources:  [place]  Patient/Family's Response to care: Patient and daughter agreeable to SNF.  Patient/Family's Understanding of and Emotional Response to Diagnosis, Current Treatment, and Prognosis:  Patient daughter is very involved with the care of her mother. Patient daughter is primary caretaker of patient. Patient is reliant on her daughter. Patient daughter is  agreeable to receiving rehab before patient is able to go back home. Patient daughter is concerned about her mother's left side. Patient daughter is supportive of her mother receiving rehab so that she could possibly gain mobility back of her left side.   Emotional Assessment Appearance:  Appears stated age Attitude/Demeanor/Rapport:  Unable to Assess Affect (typically observed):  Unable to Assess Orientation:  Oriented to Self, Oriented to Place, Oriented to Situation Alcohol / Substance use:  Not Applicable Psych involvement (Current and /or in the community):  Yes (Comment)  Discharge Needs  Concerns to be addressed:  Care Coordination Readmission within the last 30 days:  No Current discharge risk:  Cognitively Impaired, Dependent with Mobility Barriers to Discharge:  Continued Medical Work up, Ship broker, Programmer, applications ([place])   [place], LCSWA 08/05/2018, 2:39 PM

## 2018-08-05 NOTE — Care Management Note (Signed)
Case Management Note  Patient Details  Name: Raven Gray MRN: [id number] Date of Birth: [birth year]  Subjective/Objective:   Pt admitted with a stroke. She is from home with family.                 Action/Plan: Recommendations are for SNF. CM following for d/c disposition.  Expected Discharge Date:                  Expected Discharge Plan:  Skilled Nursing Facility  In-House Referral:  Clinical Social Work  Discharge planning Services     Post Acute Care Choice:    Choice offered to:     DME Arranged:    DME Agency:     HH Arranged:    [place] Agency:     Status of Service:  In process, will continue to follow  If discussed at Long Length of Stay Meetings, dates discussed:    Additional Comments:  Pollie Friar, RN 08/05/2018, 10:45 AM

## 2018-08-05 NOTE — Progress Notes (Signed)
STROKE TEAM PROGRESS NOTE     INTERVAL HISTORY Patient is comfortable. Denies any complaints and states she wants to go home.  Vitals:   08/05/18 0823 08/05/18 1030 08/05/18 1205 08/05/18 1452  BP:  (!) 157/62 (!) 160/65   Pulse:  67 65   Resp:  17 17   Temp:  98.3 F (36.8 C) 98.8 F (37.1 C)   TempSrc:  Oral Oral   SpO2: 98% 95% 94% 98%  Weight:      Height:        CBC:  Recent Labs  Lab 08/03/18 1741 08/05/18 0837  WBC 5.2 5.7  NEUTROABS 2.9  --   HGB 14.2 13.0  HCT 47.1* 40.9  MCV 93.6 89.3  PLT 42* 144*    Basic Metabolic Panel:  Recent Labs  Lab 08/03/18 1642 08/03/18 1653  NA 140 140  K 3.9 3.9  CL 101 98  CO2 32  --   GLUCOSE 104* 101*  BUN 11 14  CREATININE 1.07* 1.00  CALCIUM 8.9  --    Lipid Panel:     Component Value Date/Time   CHOL 121 08/04/2018 0258   TRIG 86 08/04/2018 0258   HDL 42 08/04/2018 0258   CHOLHDL 2.9 08/04/2018 0258   VLDL 17 08/04/2018 0258   LDLCALC 62 08/04/2018 0258   HgbA1c:  Lab Results  Component Value Date   HGBA1C 5.8 (H) 08/04/2018    IMAGING Ct Angio Head W Or Wo Contrast  Result Date: 08/03/2018 CLINICAL DATA:  Left-sided deficits EXAM: CT ANGIOGRAPHY HEAD AND NECK CT PERFUSION BRAIN TECHNIQUE: Multidetector CT imaging of the head and neck was performed using the standard protocol during bolus administration of intravenous contrast. Multiplanar CT image reconstructions and MIPs were obtained to evaluate the vascular anatomy. Carotid stenosis measurements (when applicable) are obtained utilizing NASCET criteria, using the distal internal carotid diameter as the denominator. Multiphase CT imaging of the brain was performed following IV bolus contrast injection. Subsequent parametric perfusion maps were calculated using RAPID software. CONTRAST:  175mL ISOVUE-370 IOPAMIDOL (ISOVUE-370) INJECTION 76% COMPARISON:  None. FINDINGS: CTA NECK FINDINGS SKELETON: There is no bony spinal canal stenosis. No lytic or  blastic lesion. OTHER NECK: Normal pharynx, larynx and major salivary glands. No cervical lymphadenopathy. Unremarkable thyroid gland. UPPER CHEST: Small right pleural effusion. AORTIC ARCH: There is mild calcific atherosclerosis of the aortic arch. There is no aneurysm, dissection or hemodynamically significant stenosis of the visualized ascending aorta and aortic arch. Conventional 3 vessel aortic branching pattern. The visualized proximal subclavian arteries are widely patent. RIGHT CAROTID SYSTEM: --Common carotid artery: Widely patent origin without common carotid artery dissection or aneurysm. --Internal carotid artery: No dissection, occlusion or aneurysm. Mild atherosclerotic calcification at the carotid bifurcation without hemodynamically significant stenosis. --External carotid artery: No acute abnormality. LEFT CAROTID SYSTEM: --Common carotid artery: Widely patent origin without common carotid artery dissection or aneurysm. --Internal carotid artery: No dissection, occlusion or aneurysm. Mild atherosclerotic calcification at the carotid bifurcation without hemodynamically significant stenosis. --External carotid artery: No acute abnormality. VERTEBRAL ARTERIES: Left dominant configuration. Both origins are normal. No dissection, occlusion or flow-limiting stenosis to the vertebrobasilar confluence. CTA HEAD FINDINGS ANTERIOR CIRCULATION: --Intracranial internal carotid arteries: Normal. --Anterior cerebral arteries: Normal. Absent right A1 segment, normal variant --Middle cerebral arteries: Mild narrowing of both M1 segments. --Posterior communicating arteries: Absent bilaterally. POSTERIOR CIRCULATION: --Basilar artery: Normal. --Posterior cerebral arteries: Normal. --Superior cerebellar arteries: Normal. --Inferior cerebellar arteries: Normal anterior and posterior inferior cerebellar arteries. VENOUS SINUSES:  As permitted by contrast timing, patent. ANATOMIC VARIANTS: None DELAYED PHASE: No  parenchymal contrast enhancement. Review of the MIP images confirms the above findings. CT Brain Perfusion Findings: CBF (<30%) Volume: 36mL Perfusion (Tmax>6.0s) volume: 41mL Mismatch Volume: 36mL Infarction Location:Posterior right MCA territory IMPRESSION: 1. No emergent large vessel occlusion. 2. 22 mL posterior right MCA territory area of ischemia by CT perfusion criteria. No demonstrated core infarct. 3. Mild bilateral M1 segment narrowing of the middle cerebral arteries. 4.  Aortic atherosclerosis (ICD10-I70.0). 5. Incompletely visualized right pleural effusion. Electronically Signed   By: Ulyses Jarred M.D.   On: 08/03/2018 21:30   Dg Chest 2 View  Result Date: 08/03/2018 CLINICAL DATA:  Slurred speech EXAM: CHEST - 2 VIEW COMPARISON:  05/23/2018 FINDINGS: The heart is markedly enlarged. Vascular congestion. Diffuse interstitial edema. No pneumothorax. Hiatal hernia is unchanged. No pneumothorax. Single lead left subclavian pacemaker device is partially imaged. IMPRESSION: CHF with interstitial edema. Electronically Signed   By: Marybelle Killings M.D.   On: 08/03/2018 18:12   Ct Head Wo Contrast  Result Date: 08/05/2018 CLINICAL DATA:  Follow-up examination for acute stroke. EXAM: CT HEAD WITHOUT CONTRAST TECHNIQUE: Contiguous axial images were obtained from the base of the skull through the vertex without intravenous contrast. COMPARISON:  Prior CT perfusion from 08/03/2018. FINDINGS: Brain: Few small areas of evolving patchy hypodensities seen within the right frontoparietal region, posterior right MCA territory, compatible with evolving ischemic infarcts. These closely approximate previously seen perfusion defect. Overall, areas of ischemia fairly small in size. No associated mass effect or hemorrhage. No other evidence for acute large vessel territory infarct. No intracranial hemorrhage. No mass lesion, midline shift or mass effect. Diffuse ventricular prominence related global parenchymal volume  loss of hydrocephalus. No extra-axial fluid collection. Vascular: No hyperdense vessel. Calcified atherosclerosis at the skull base. Skull: Scalp soft tissues and calvarium within normal limits. Sinuses/Orbits: Globes and orbital soft tissues within normal limits. Paranasal sinuses are largely clear. No mastoid effusion. Other: None. IMPRESSION: 1. Evolving small volume acute right posterior MCA territory infarcts, likely embolic in nature. No associated hemorrhage or mass effect. 2. No other new acute intracranial abnormality. 3. Underlying atrophy with advanced chronic microvascular ischemic disease, stable. Electronically Signed   By: Jeannine Boga M.D.   On: 08/05/2018 14:22   Ct Angio Neck W Or Wo Contrast  Result Date: 08/03/2018 CLINICAL DATA:  Left-sided deficits EXAM: CT ANGIOGRAPHY HEAD AND NECK CT PERFUSION BRAIN TECHNIQUE: Multidetector CT imaging of the head and neck was performed using the standard protocol during bolus administration of intravenous contrast. Multiplanar CT image reconstructions and MIPs were obtained to evaluate the vascular anatomy. Carotid stenosis measurements (when applicable) are obtained utilizing NASCET criteria, using the distal internal carotid diameter as the denominator. Multiphase CT imaging of the brain was performed following IV bolus contrast injection. Subsequent parametric perfusion maps were calculated using RAPID software. CONTRAST:  14mL ISOVUE-370 IOPAMIDOL (ISOVUE-370) INJECTION 76% COMPARISON:  None. FINDINGS: CTA NECK FINDINGS SKELETON: There is no bony spinal canal stenosis. No lytic or blastic lesion. OTHER NECK: Normal pharynx, larynx and major salivary glands. No cervical lymphadenopathy. Unremarkable thyroid gland. UPPER CHEST: Small right pleural effusion. AORTIC ARCH: There is mild calcific atherosclerosis of the aortic arch. There is no aneurysm, dissection or hemodynamically significant stenosis of the visualized ascending aorta and aortic  arch. Conventional 3 vessel aortic branching pattern. The visualized proximal subclavian arteries are widely patent. RIGHT CAROTID SYSTEM: --Common carotid artery: Widely patent origin without common carotid  artery dissection or aneurysm. --Internal carotid artery: No dissection, occlusion or aneurysm. Mild atherosclerotic calcification at the carotid bifurcation without hemodynamically significant stenosis. --External carotid artery: No acute abnormality. LEFT CAROTID SYSTEM: --Common carotid artery: Widely patent origin without common carotid artery dissection or aneurysm. --Internal carotid artery: No dissection, occlusion or aneurysm. Mild atherosclerotic calcification at the carotid bifurcation without hemodynamically significant stenosis. --External carotid artery: No acute abnormality. VERTEBRAL ARTERIES: Left dominant configuration. Both origins are normal. No dissection, occlusion or flow-limiting stenosis to the vertebrobasilar confluence. CTA HEAD FINDINGS ANTERIOR CIRCULATION: --Intracranial internal carotid arteries: Normal. --Anterior cerebral arteries: Normal. Absent right A1 segment, normal variant --Middle cerebral arteries: Mild narrowing of both M1 segments. --Posterior communicating arteries: Absent bilaterally. POSTERIOR CIRCULATION: --Basilar artery: Normal. --Posterior cerebral arteries: Normal. --Superior cerebellar arteries: Normal. --Inferior cerebellar arteries: Normal anterior and posterior inferior cerebellar arteries. VENOUS SINUSES: As permitted by contrast timing, patent. ANATOMIC VARIANTS: None DELAYED PHASE: No parenchymal contrast enhancement. Review of the MIP images confirms the above findings. CT Brain Perfusion Findings: CBF (<30%) Volume: 71mL Perfusion (Tmax>6.0s) volume: 53mL Mismatch Volume: 65mL Infarction Location:Posterior right MCA territory IMPRESSION: 1. No emergent large vessel occlusion. 2. 22 mL posterior right MCA territory area of ischemia by CT perfusion  criteria. No demonstrated core infarct. 3. Mild bilateral M1 segment narrowing of the middle cerebral arteries. 4.  Aortic atherosclerosis (ICD10-I70.0). 5. Incompletely visualized right pleural effusion. Electronically Signed   By: Ulyses Jarred M.D.   On: 08/03/2018 21:30   Ct Cerebral Perfusion W Contrast  Result Date: 08/03/2018 CLINICAL DATA:  Left-sided deficits EXAM: CT ANGIOGRAPHY HEAD AND NECK CT PERFUSION BRAIN TECHNIQUE: Multidetector CT imaging of the head and neck was performed using the standard protocol during bolus administration of intravenous contrast. Multiplanar CT image reconstructions and MIPs were obtained to evaluate the vascular anatomy. Carotid stenosis measurements (when applicable) are obtained utilizing NASCET criteria, using the distal internal carotid diameter as the denominator. Multiphase CT imaging of the brain was performed following IV bolus contrast injection. Subsequent parametric perfusion maps were calculated using RAPID software. CONTRAST:  160mL ISOVUE-370 IOPAMIDOL (ISOVUE-370) INJECTION 76% COMPARISON:  None. FINDINGS: CTA NECK FINDINGS SKELETON: There is no bony spinal canal stenosis. No lytic or blastic lesion. OTHER NECK: Normal pharynx, larynx and major salivary glands. No cervical lymphadenopathy. Unremarkable thyroid gland. UPPER CHEST: Small right pleural effusion. AORTIC ARCH: There is mild calcific atherosclerosis of the aortic arch. There is no aneurysm, dissection or hemodynamically significant stenosis of the visualized ascending aorta and aortic arch. Conventional 3 vessel aortic branching pattern. The visualized proximal subclavian arteries are widely patent. RIGHT CAROTID SYSTEM: --Common carotid artery: Widely patent origin without common carotid artery dissection or aneurysm. --Internal carotid artery: No dissection, occlusion or aneurysm. Mild atherosclerotic calcification at the carotid bifurcation without hemodynamically significant stenosis.  --External carotid artery: No acute abnormality. LEFT CAROTID SYSTEM: --Common carotid artery: Widely patent origin without common carotid artery dissection or aneurysm. --Internal carotid artery: No dissection, occlusion or aneurysm. Mild atherosclerotic calcification at the carotid bifurcation without hemodynamically significant stenosis. --External carotid artery: No acute abnormality. VERTEBRAL ARTERIES: Left dominant configuration. Both origins are normal. No dissection, occlusion or flow-limiting stenosis to the vertebrobasilar confluence. CTA HEAD FINDINGS ANTERIOR CIRCULATION: --Intracranial internal carotid arteries: Normal. --Anterior cerebral arteries: Normal. Absent right A1 segment, normal variant --Middle cerebral arteries: Mild narrowing of both M1 segments. --Posterior communicating arteries: Absent bilaterally. POSTERIOR CIRCULATION: --Basilar artery: Normal. --Posterior cerebral arteries: Normal. --Superior cerebellar arteries: Normal. --Inferior cerebellar arteries: Normal anterior and  posterior inferior cerebellar arteries. VENOUS SINUSES: As permitted by contrast timing, patent. ANATOMIC VARIANTS: None DELAYED PHASE: No parenchymal contrast enhancement. Review of the MIP images confirms the above findings. CT Brain Perfusion Findings: CBF (<30%) Volume: 9mL Perfusion (Tmax>6.0s) volume: 48mL Mismatch Volume: 101mL Infarction Location:Posterior right MCA territory IMPRESSION: 1. No emergent large vessel occlusion. 2. 22 mL posterior right MCA territory area of ischemia by CT perfusion criteria. No demonstrated core infarct. 3. Mild bilateral M1 segment narrowing of the middle cerebral arteries. 4.  Aortic atherosclerosis (ICD10-I70.0). 5. Incompletely visualized right pleural effusion. Electronically Signed   By: Ulyses Jarred M.D.   On: 08/03/2018 21:30   Ct Head Code Stroke Wo Contrast  Result Date: 08/03/2018 CLINICAL DATA:  Code stroke. 84 year female with slurred speech, leg weakness and  numbness. Last known well 1545 hours. EXAM: CT HEAD WITHOUT CONTRAST TECHNIQUE: Contiguous axial images were obtained from the base of the skull through the vertex without intravenous contrast. COMPARISON:  Head CT without contrast 05/23/2018 and earlier. FINDINGS: Brain: Stable cerebral volume. Stable ventricle size and configuration. Heterogeneous hypodensity in the bilateral cerebral white matter, deep gray matter, and right cerebellum compatible with chronic small vessel disease appears stable since September. Small area of left occipital pole cortical encephalomalacia is stable. No midline shift, mass effect, evidence of mass lesion, intracranial hemorrhage or evidence of cortically based acute infarction. Vascular: Calcified atherosclerosis at the skull base. No suspicious intracranial vascular hyperdensity. Skull: No acute osseous abnormality identified. Sinuses/Orbits: Visualized paranasal sinuses and mastoids are stable and well pneumatized. Other: Stable orbit and scalp soft tissues. ASPECTS [facility] Stroke Program Early CT Score) - Ganglionic level infarction (caudate, lentiform nuclei, internal capsule, insula, M1-M3 cortex): 7 - Supraganglionic infarction (M4-M6 cortex): 3 Total score (0-10 with 10 being normal): 10 IMPRESSION: 1. No acute intracranial hemorrhage or acute cortically based infarct identified. ASPECTS is 10. 2. Chronic ischemic disease appears stable since September. 3. These results were communicated to Dr. Rory Percy at 5:06 pmon 11/12/2019by text page via the [facility] messaging system. Electronically Signed   By: Genevie Ann M.D.   On: 08/03/2018 17:07    PHYSICAL EXAM Frail elderly lady not in distress . Afebrile. Head is nontraumatic. Neck is supple without bruit.    Cardiac exam no murmur or gallop. Lungs are clear to auscultation. Distal pulses are well felt. Neurological Exam :  Awake alert oriented 2. Speech is slightly slow and hesitant but no dysarthria able to name and repeat well.  Diminished attention and recall. Extraocular moments are full range without nystagmus. Blinks to threat bilaterally. Face is symmetric. Tongue is midline. Motor system exam no upper extremity drift with symmetric strength. Mild proximal bilateral lower extremity weakness. Diminished fine finger movements on the left. Mild left grip weakness compared to the right. Deep tendon concern depressed symmetric bilaterally. Plantars downgoing. Gait not tested. ASSESSMENT/PLAN Raven Gray is a 84 y.o. female with history of AF not on AC d/t falls, CHF, COPD on home oxygen, dementia, history of depression, history of stroke with no residual deficits, right shoulder pain presenting with slurred speech and B LE weakness. Developed L sided weakness and neglect. Taken for CTA with shows ischemia but no LVO. Not an IR candidate.   Stroke:   Right MCA embolic infarct secondary to known atrial fibrillation not on anticoagulation due to fall risk, dementia  Code Stroke CT head No acute stroke. Small vessel disease. ASPECTS 10.     CTA head & neck no  LVO.  Mild bilateral M1 narrowing.  Aortic atherosclerosis.  Incompletely visualized right pleural effusion.  CT perfusion 22 mL right MCA ischemia.  No demonstrated core infarct.  Repeat CT Head 08/05/18 shows patchy right pareital embolic infarct 2D Echo Left ventricle: The cavity size was mildly dilated. Systolic  function was mildly reduced. The estimated ejection fraction was in the range of 45% to 50%. Diffuse hypokinesis, with regionally worsened wall motion in the septum. Doppler parameters are consistent with high ventricular filling   LDL 62  HgbA1c 5.8  SCDs for VTE prophylaxis  No antithrombotic prior to admission, now on aspirin 300 mg suppository daily.  Given n.p.o. status, continue aspirin suppository at this time.  Therapy recommendations: Pending  Disposition: Pending (admitted from [facility])  Dysphagia secondary to stroke  Diet Order             Diet Heart Room service appropriate? Yes; Fluid consistency: Thin  Diet effective now             Speech therapy to assess  atrial Fibrillation  Home anticoagulation:  None due to history of falls she was taken off the direct oral anticoagulant she was on  Given signs of stroke, currently not a anticoagulation candidate  Hypertension  Stable . Permissive hypertension (OK if < 220/120) but gradually normalize in 5-7 days . 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 She has presented with embolic small right MCA infarct secondary to atrial fibrillation. She has had previous strokes and has not been a good candidate for anticoagulation given her dementia and fall risk. Recommend continue ongoing stroke workup. Continue aspirin 325 mg daily. No family available at the bedside for discussion. Stroke team will sign off. Kindly call for questions. Discussed with Dr. Dierdre Forth, [place] Pager: [phone] 08/05/2018 3:05 PM  To contact Stroke Continuity provider, please refer to [email or web address] After hours, contact General Neurology

## 2018-08-05 NOTE — Consult Note (Signed)
   [facility] Inpatient Consult   08/05/2018  Raven Gray 18-Nov-1927 [id number]  Patient screened for high risk score [place]. Chart review reveals that the patient has been active with [facility] [place].  Call placed to [facility] at [place] with [place].  Patient will be followed in their care management program.  [facility] will sign off.   Please place a [place] [facility] consult if disposition changes for skilled facility or for questions contact:   Natividad Brood, RN BSN [place] [facility] Liaison  [phone] business mobile phone Toll free office [phone]

## 2018-08-05 NOTE — Evaluation (Signed)
Occupational Therapy Evaluation Patient Details Name: Raven Gray MRN: [id number] DOB: May 26, 1928 Today's Date: 08/05/2018    History of Present Illness Pt is an 84 y/o female admitted secondary to L sided weakness. Perfusion imaging revealed R MCA infarction. PMH includes COPD on 2L, a fib, dCHF, CVA, dementia, s/p pacemaker and gallbladder carcinoma.    Clinical Impression   Pt admitted with the above diagnoses and presents with below problem list. Pt will benefit from continued acute OT to address the below listed deficits and maximize independence with basic ADLs prior to d/c to venue below. PTA pt was needing some assistance from family with bathing/dressing; ambulated with rw min guard A. Pt presents with L side hemiplegia, L side inattention, and decreased cognition impacting ADLs. Pt is currently +2 max physical A with LB ADLs and toilet transfers (stand pivot this session); max A with UB ADLs. Spoke with daughter regarding d/c plan and L side inattention strategies. Daughter present throughout session.       Follow Up Recommendations  SNF    Equipment Recommendations  Other (comment)(defer to next venue)    Recommendations for Other Services       Precautions / Restrictions Precautions Precautions: Fall Precaution Comments: decreased L side awareness Restrictions Weight Bearing Restrictions: No      Mobility Bed Mobility Overal bed mobility: Needs Assistance Bed Mobility: Supine to Sit     Supine to sit: Mod assist;HOB elevated;+2 for safety/equipment Sit to supine: Max assist   General bed mobility comments: assist for L LE off bed and to lift trunk upright  Transfers Overall transfer level: Needs assistance Equipment used: Rolling walker (2 wheeled) Transfers: Sit to/from [facility] Sit to Stand: Mod assist;+2 physical assistance Stand pivot transfers: Mod assist;Max assist;+2 physical assistance       General transfer comment: cues for ant  weight shift/momentum, assist for L UE placement on walker; used walker with assist and able to take steps, but L LE fatigue vs inattention began to buckle and needing increased assist to get to chair safely    Balance Overall balance assessment: Needs assistance Sitting-balance support: Feet supported Sitting balance-Leahy Scale: Poor Sitting balance - Comments: close S at times with max verbal cues for preventing posterior LOB and L LOB, fatigued and required up to mod A for balance Postural control: Posterior lean;Left lateral lean   Standing balance-Leahy Scale: Poor Standing balance comment: UE support and assist in standing                           ADL either performed or assessed with clinical judgement   ADL Overall ADL's : Needs assistance/impaired Eating/Feeding: Sitting;Minimal assistance   Grooming: Maximal assistance;Sitting   Upper Body Bathing: Maximal assistance;Sitting   Lower Body Bathing: Maximal assistance;+2 for physical assistance;Moderate assistance;Sit to/from stand   Upper Body Dressing : Maximal assistance;Sitting;Cueing for compensatory techniques;Cueing for sequencing;Cueing for UE precautions   Lower Body Dressing: Moderate assistance;Maximal assistance;+2 for physical assistance;Sit to/from stand   Toilet Transfer: Maximal assistance;+2 for physical assistance;Stand-pivot;BSC;RW   Toileting- Clothing Manipulation and Hygiene: Maximal assistance;+2 for physical assistance;Sit to/from stand   Tub/ Shower Transfer: Walk-in shower;Maximal assistance;+2 for physical assistance;Stand-pivot;3 in 1;Rolling walker     General ADL Comments: Pt completed bed mobility and simulated toilet transfer (EOB>recliner).      Vision   Additional Comments: able to track to the left with cueing provided. cognition limiting assessment. to be further assessed in functional  context.     Perception Perception Perception Tested?: Yes Perception Deficits:  Inattention/neglect Inattention/Neglect: Does not attend to left visual field   Praxis      Pertinent Vitals/Pain Pain Assessment: Faces Faces Pain Scale: Hurts even more Pain Location: back Pain Descriptors / Indicators: Aching;Tightness Pain Intervention(s): Monitored during session;Repositioned     Hand Dominance Right   Extremity/Trunk Assessment Upper Extremity Assessment Upper Extremity Assessment: Generalized weakness;LUE deficits/detail LUE Deficits / Details: L arm drift noted, 3/5 gross stength; L side inattention needing cues to maintain awareness of LUE during bed mobility and transfers LUE Coordination: decreased fine motor;decreased gross motor   Lower Extremity Assessment Lower Extremity Assessment: Defer to PT evaluation   Cervical / Trunk Assessment Cervical / Trunk Assessment: Kyphotic   Communication Communication Communication: Expressive difficulties   Cognition Arousal/Alertness: Awake/alert Behavior During Therapy: WFL for tasks assessed/performed Overall Cognitive Status: History of cognitive impairments - at baseline                                 General Comments: daughter reports worse with sundowning with fatigue, or later in day. L inattention and limited deficit awareness.   General Comments  L inattention and limited deficit awareness    Exercises     Shoulder Instructions      Home Living Family/patient expects to be discharged to:: Private residence Living Arrangements: Children Available Help at Discharge: Family;Available 24 hours/day Type of Home: House Home Access: Ramped entrance;Stairs to enter   Entrance Stairs-Rails: None Home Layout: One level     Bathroom Shower/Tub: Teacher, early years/pre: Handicapped height Bathroom Accessibility: Yes   Home Equipment: Clinical cytogeneticist - 2 wheels;Cane - single point;Bedside commode;Wheelchair - Chartered loss adjuster - 4 wheels      Lives With:  Family    Prior Functioning/Environment Level of Independence: Needs assistance  Gait / Transfers Assistance Needed: ambulates with rw and min guard to min A assist, h/o falls ADL's / Homemaking Assistance Needed: needed assist with bathing/dressing from daughter. Able to feed herself with setup provided            OT Problem List: Decreased strength;Decreased range of motion;Decreased activity tolerance;Impaired balance (sitting and/or standing);Decreased coordination;Decreased cognition;Decreased safety awareness;Impaired vision/perception;Decreased knowledge of precautions;Decreased knowledge of use of DME or AE;Impaired sensation;Impaired tone;Impaired UE functional use;Pain      OT Treatment/Interventions: Self-care/ADL training;Therapeutic exercise;Neuromuscular education;DME and/or AE instruction;Therapeutic activities;Cognitive remediation/compensation;Patient/family education;Balance training    OT Goals(Current goals can be found in the care plan section) Acute Rehab OT Goals OT Goal Formulation: With patient/family Time For Goal Achievement: 08/19/18 Potential to Achieve Goals: Good ADL Goals Pt Will Perform Grooming: sitting;with mod assist Pt Will Perform Upper Body Bathing: with mod assist;sitting Pt Will Perform Lower Body Bathing: with max assist;sit to/from stand Pt Will Transfer to Toilet: with max assist;stand pivot transfer;bedside commode Pt Will Perform Toileting - Clothing Manipulation and hygiene: with max assist;sit to/from stand Pt/caregiver will Perform Home Exercise Program: Increased strength;Left upper extremity;With minimal assist;With written HEP provided Additional ADL Goal #1: Pt will sit EOB 2 minutes at min guard level to prepare for UB ADLs.  OT Frequency: Min 2X/week   Barriers to D/C:            Co-evaluation              AM-PAC PT "6 Clicks" Daily Activity     Outcome Measure Help from another  person eating meals?: A Lot Help from  another person taking care of personal grooming?: A Lot Help from another person toileting, which includes using toliet, bedpan, or urinal?: Total Help from another person bathing (including washing, rinsing, drying)?: Total Help from another person to put on and taking off regular upper body clothing?: A Lot Help from another person to put on and taking off regular lower body clothing?: A Lot 6 Click Score: 10   End of Session Equipment Utilized During Treatment: Oxygen;Rolling walker;Gait belt  Activity Tolerance: Patient limited by fatigue;Patient tolerated treatment well Patient left: in chair;with call bell/phone within reach;with chair alarm set;with family/visitor present  OT Visit Diagnosis: Unsteadiness on feet (R26.81);Hemiplegia and hemiparesis;Other abnormalities of gait and mobility (R26.89);History of falling (Z91.81);Muscle weakness (generalized) (M62.81);Other symptoms and signs involving cognitive function;Cognitive communication deficit (R41.841);Pain Hemiplegia - Right/Left: Left                Time: 1245-8099 OT Time Calculation (min): 28 min Charges:  OT General Charges $OT Visit: 1 Visit OT Evaluation $OT Eval Moderate Complexity: Morris, OT Acute Rehabilitation Services Pager: [phone] Office: [phone]   Hortencia Pilar 08/05/2018, 1:12 PM

## 2018-08-05 NOTE — Progress Notes (Signed)
PROGRESS NOTE    Raven Gray  JOA:[id number] DOB: 1927-11-16 DOA: 08/03/2018 PCP: Raven Morning, DO   Brief Narrative:  HPI on 08/03/2018 by Dr. Ivor Gray Raven Gray is a 84 y.o. female with medical history significant of hypertension, hyperlipidemia, COPD on 2 L nasal cannula oxygen, stroke, GERD, anxiety, tachycardia-bradycardia syndrome, pacemaker placement, gallbladder cancer, atrial fibrillation not on anticoagulant due to fall, CHF with, PE not on anticoagulants, who presents with left-sided weakness, numbness, slurred speech, confusion and dizziness.  Per patient's family, patient initially presents with transient slurred speech, leg weakness, leg numbness and dizziness.  Patient also had mild confusion. Symptoms started at about 15:45, then resolved by 16:00. While being evaluated in the ED, pt developed left-sided weakness and numbness in arm and leg at about 8:30 PM, which has been persistent.  Patient does not have facial droop, slurred speech, vision change or hearing loss.  Patient has mild dry cough and mild shortness of breath due to COPD, which are at baseline.  No chest pain, fever or chills.  Patient has nausea, no vomiting, diarrhea or abdominal pain pain no symptoms of UTI.  She had confusion earlier, which has resolved.  Currently patient is oriented x3.  Interim history Admitted for acute CVA. Workup up in progress. Pending SNF. Assessment & Plan   Acute CVA -CT head showed no acute stroke.  Small vessel disease. -CTA head and neck showed no large vessel occlusion.  Mild bilateral M1 narrowing.  Aortic atherosclerosis. -CT perfusion showed 22 mL right MCA ischemia.  No demonstrated for infarct. -MRI cannot be done due to pacemaker implant  -LDL 62, hemoglobin A1c 5.8 -Echocardiogram: EF 45 to 50%, no cardiac source of embolism identified. -Neurology consulted and appreciated, feels CVA secondary to atrial fibrillation however patient cannot be anticoagulated due to  frequent falls.  Recommended continuing antiplatelet therapy. -PT recommended SNF  Essential hypertension -Amlodipine currently held for permissive hypertension -Continue Metoprolol for control of atrial fibrillation -Continue Lasix for CHF management  Atrial fibrillation, chronic -CHADSVASC 7 -Currently not on anticoagulation due to risk of fall  -Continue metoprolol  Chronic systolic/diastolic heart failure -Patient appears to be compensated and euvolemic -BNP 635 on admission -Continue Lasix -Monitor intake and output, daily weights -Echocardiogram as above  Hyperlipidemia -Continue statin  Chronic hypoxic respiratory failure/COPD -Continue bronchodilators, currently stable  Primary gallbladder adenocarcinoma -Patient follows with oncology, Dr. Burr Gray -Status post cholecystectomy -CT abdomen did not show evidence of metastatic disease  GERD -Continue Protonix  Thrombocytopenia -Question if original lab value of platelets of 42 (suspect erroneous lab value) -Platelets currently 144 (on review of patient's chart, patient has not had a history of thrombocytopenia)  DVT Prophylaxis  SCDs  Code Status: DNR  Family Communication: none at bedside  Disposition Plan: Admitted. Pending further neuro recommendations and SNF placement  Consultants Neurology  Procedures  Echocardiogram   Antibiotics   Anti-infectives (From admission, onward)   None      Subjective:   Raven Gray seen and examined today.  Patient would like to go home.  Denies current chest pain, shortness of breath, abdominal pain, nausea or vomiting, diarrhea or constipation.  Objective:   Vitals:   08/05/18 0808 08/05/18 0823 08/05/18 1030 08/05/18 1205  BP:   (!) 157/62 (!) 160/65  Pulse:   67 65  Resp:   17 17  Temp: 98.4 F (36.9 C)  98.3 F (36.8 C) 98.8 F (37.1 C)  TempSrc: Oral  Oral Oral  SpO2:  98% 95% 94%  Weight:      Height:       No intake or output data in the 24 hours  ending 08/05/18 1218 Filed Weights   08/03/18 1701  Weight: 71.8 kg    Exam  General: Well developed, well nourished, NAD, appears stated age  42: NCAT, mucous membranes moist.   Neck: Supple  Cardiovascular: S1 S2 auscultated, irregular, +SEM  Respiratory: Clear to auscultation bilaterally   Abdomen: Soft, nontender, nondistended, + bowel sounds  Extremities: warm dry without cyanosis clubbing or edema  Neuro: AAOx3, decreased right hand grip, decreased RLE weakness.   Psych: Normal affect and demeanor with intact judgement and insight   Data Reviewed: I have personally reviewed following labs and imaging studies  CBC: Recent Labs  Lab 08/03/18 1653 08/03/18 1741 08/05/18 0837  WBC  --  5.2 5.7  NEUTROABS  --  2.9  --   HGB 15.3* 14.2 13.0  HCT 45.0 47.1* 40.9  MCV  --  93.6 89.3  PLT  --  42* 413*   Basic Metabolic Panel: Recent Labs  Lab 08/03/18 1642 08/03/18 1653  NA 140 140  K 3.9 3.9  CL 101 98  CO2 32  --   GLUCOSE 104* 101*  BUN 11 14  CREATININE 1.07* 1.00  CALCIUM 8.9  --    GFR: Estimated Creatinine Clearance: 37 mL/min (by C-G formula based on SCr of 1 mg/dL). Liver Function Tests: Recent Labs  Lab 08/03/18 1642  AST 28  ALT 13  ALKPHOS 84  BILITOT 0.9  PROT 6.7  ALBUMIN 3.4*   No results for input(s): LIPASE, AMYLASE in the last 168 hours. No results for input(s): AMMONIA in the last 168 hours. Coagulation Profile: Recent Labs  Lab 08/03/18 1642  INR 1.10   Cardiac Enzymes: No results for input(s): CKTOTAL, CKMB, CKMBINDEX, TROPONINI in the last 168 hours. BNP (last 3 results) No results for input(s): PROBNP in the last 8760 hours. HbA1C: Recent Labs    08/04/18 0258  HGBA1C 5.8*   CBG: Recent Labs  Lab 08/03/18 1648  GLUCAP 91   Lipid Profile: Recent Labs    08/04/18 0258  CHOL 121  HDL 42  LDLCALC 62  TRIG 86  CHOLHDL 2.9   Thyroid Function Tests: No results for input(s): TSH, T4TOTAL, FREET4,  T3FREE, THYROIDAB in the last 72 hours. Anemia Panel: No results for input(s): VITAMINB12, FOLATE, FERRITIN, TIBC, IRON, RETICCTPCT in the last 72 hours. Urine analysis:    Component Value Date/Time   COLORURINE YELLOW 08/03/2018 1728   APPEARANCEUR CLEAR 08/03/2018 1728   LABSPEC 1.010 08/03/2018 1728   PHURINE 6.0 08/03/2018 1728   GLUCOSEU NEGATIVE 08/03/2018 1728   HGBUR NEGATIVE 08/03/2018 1728   BILIRUBINUR NEGATIVE 08/03/2018 1728   KETONESUR NEGATIVE 08/03/2018 1728   PROTEINUR NEGATIVE 08/03/2018 1728   UROBILINOGEN 1.0 10/20/2014 1614   NITRITE NEGATIVE 08/03/2018 1728   LEUKOCYTESUR NEGATIVE 08/03/2018 1728   Sepsis Labs: @LABRCNTIP (procalcitonin:4,lacticidven:4)  )No results found for this or any previous visit (from the past 240 hour(s)).    Radiology Studies: Ct Angio Head W Or Wo Contrast  Result Date: 08/03/2018 CLINICAL DATA:  Left-sided deficits EXAM: CT ANGIOGRAPHY HEAD AND NECK CT PERFUSION BRAIN TECHNIQUE: Multidetector CT imaging of the head and neck was performed using the standard protocol during bolus administration of intravenous contrast. Multiplanar CT image reconstructions and MIPs were obtained to evaluate the vascular anatomy. Carotid stenosis measurements (when applicable) are obtained utilizing NASCET criteria,  using the distal internal carotid diameter as the denominator. Multiphase CT imaging of the brain was performed following IV bolus contrast injection. Subsequent parametric perfusion maps were calculated using RAPID software. CONTRAST:  170mL ISOVUE-370 IOPAMIDOL (ISOVUE-370) INJECTION 76% COMPARISON:  None. FINDINGS: CTA NECK FINDINGS SKELETON: There is no bony spinal canal stenosis. No lytic or blastic lesion. OTHER NECK: Normal pharynx, larynx and major salivary glands. No cervical lymphadenopathy. Unremarkable thyroid gland. UPPER CHEST: Small right pleural effusion. AORTIC ARCH: There is mild calcific atherosclerosis of the aortic arch. There  is no aneurysm, dissection or hemodynamically significant stenosis of the visualized ascending aorta and aortic arch. Conventional 3 vessel aortic branching pattern. The visualized proximal subclavian arteries are widely patent. RIGHT CAROTID SYSTEM: --Common carotid artery: Widely patent origin without common carotid artery dissection or aneurysm. --Internal carotid artery: No dissection, occlusion or aneurysm. Mild atherosclerotic calcification at the carotid bifurcation without hemodynamically significant stenosis. --External carotid artery: No acute abnormality. LEFT CAROTID SYSTEM: --Common carotid artery: Widely patent origin without common carotid artery dissection or aneurysm. --Internal carotid artery: No dissection, occlusion or aneurysm. Mild atherosclerotic calcification at the carotid bifurcation without hemodynamically significant stenosis. --External carotid artery: No acute abnormality. VERTEBRAL ARTERIES: Left dominant configuration. Both origins are normal. No dissection, occlusion or flow-limiting stenosis to the vertebrobasilar confluence. CTA HEAD FINDINGS ANTERIOR CIRCULATION: --Intracranial internal carotid arteries: Normal. --Anterior cerebral arteries: Normal. Absent right A1 segment, normal variant --Middle cerebral arteries: Mild narrowing of both M1 segments. --Posterior communicating arteries: Absent bilaterally. POSTERIOR CIRCULATION: --Basilar artery: Normal. --Posterior cerebral arteries: Normal. --Superior cerebellar arteries: Normal. --Inferior cerebellar arteries: Normal anterior and posterior inferior cerebellar arteries. VENOUS SINUSES: As permitted by contrast timing, patent. ANATOMIC VARIANTS: None DELAYED PHASE: No parenchymal contrast enhancement. Review of the MIP images confirms the above findings. CT Brain Perfusion Findings: CBF (<30%) Volume: 13mL Perfusion (Tmax>6.0s) volume: 96mL Mismatch Volume: 3mL Infarction Location:Posterior right MCA territory IMPRESSION: 1. No  emergent large vessel occlusion. 2. 22 mL posterior right MCA territory area of ischemia by CT perfusion criteria. No demonstrated core infarct. 3. Mild bilateral M1 segment narrowing of the middle cerebral arteries. 4.  Aortic atherosclerosis (ICD10-I70.0). 5. Incompletely visualized right pleural effusion. Electronically Signed   By: Ulyses Jarred M.D.   On: 08/03/2018 21:30   Dg Chest 2 View  Result Date: 08/03/2018 CLINICAL DATA:  Slurred speech EXAM: CHEST - 2 VIEW COMPARISON:  05/23/2018 FINDINGS: The heart is markedly enlarged. Vascular congestion. Diffuse interstitial edema. No pneumothorax. Hiatal hernia is unchanged. No pneumothorax. Single lead left subclavian pacemaker device is partially imaged. IMPRESSION: CHF with interstitial edema. Electronically Signed   By: Marybelle Killings M.D.   On: 08/03/2018 18:12   Ct Angio Neck W Or Wo Contrast  Result Date: 08/03/2018 CLINICAL DATA:  Left-sided deficits EXAM: CT ANGIOGRAPHY HEAD AND NECK CT PERFUSION BRAIN TECHNIQUE: Multidetector CT imaging of the head and neck was performed using the standard protocol during bolus administration of intravenous contrast. Multiplanar CT image reconstructions and MIPs were obtained to evaluate the vascular anatomy. Carotid stenosis measurements (when applicable) are obtained utilizing NASCET criteria, using the distal internal carotid diameter as the denominator. Multiphase CT imaging of the brain was performed following IV bolus contrast injection. Subsequent parametric perfusion maps were calculated using RAPID software. CONTRAST:  154mL ISOVUE-370 IOPAMIDOL (ISOVUE-370) INJECTION 76% COMPARISON:  None. FINDINGS: CTA NECK FINDINGS SKELETON: There is no bony spinal canal stenosis. No lytic or blastic lesion. OTHER NECK: Normal pharynx, larynx and major salivary glands. No cervical  lymphadenopathy. Unremarkable thyroid gland. UPPER CHEST: Small right pleural effusion. AORTIC ARCH: There is mild calcific atherosclerosis  of the aortic arch. There is no aneurysm, dissection or hemodynamically significant stenosis of the visualized ascending aorta and aortic arch. Conventional 3 vessel aortic branching pattern. The visualized proximal subclavian arteries are widely patent. RIGHT CAROTID SYSTEM: --Common carotid artery: Widely patent origin without common carotid artery dissection or aneurysm. --Internal carotid artery: No dissection, occlusion or aneurysm. Mild atherosclerotic calcification at the carotid bifurcation without hemodynamically significant stenosis. --External carotid artery: No acute abnormality. LEFT CAROTID SYSTEM: --Common carotid artery: Widely patent origin without common carotid artery dissection or aneurysm. --Internal carotid artery: No dissection, occlusion or aneurysm. Mild atherosclerotic calcification at the carotid bifurcation without hemodynamically significant stenosis. --External carotid artery: No acute abnormality. VERTEBRAL ARTERIES: Left dominant configuration. Both origins are normal. No dissection, occlusion or flow-limiting stenosis to the vertebrobasilar confluence. CTA HEAD FINDINGS ANTERIOR CIRCULATION: --Intracranial internal carotid arteries: Normal. --Anterior cerebral arteries: Normal. Absent right A1 segment, normal variant --Middle cerebral arteries: Mild narrowing of both M1 segments. --Posterior communicating arteries: Absent bilaterally. POSTERIOR CIRCULATION: --Basilar artery: Normal. --Posterior cerebral arteries: Normal. --Superior cerebellar arteries: Normal. --Inferior cerebellar arteries: Normal anterior and posterior inferior cerebellar arteries. VENOUS SINUSES: As permitted by contrast timing, patent. ANATOMIC VARIANTS: None DELAYED PHASE: No parenchymal contrast enhancement. Review of the MIP images confirms the above findings. CT Brain Perfusion Findings: CBF (<30%) Volume: 8mL Perfusion (Tmax>6.0s) volume: 45mL Mismatch Volume: 3mL Infarction Location:Posterior right MCA  territory IMPRESSION: 1. No emergent large vessel occlusion. 2. 22 mL posterior right MCA territory area of ischemia by CT perfusion criteria. No demonstrated core infarct. 3. Mild bilateral M1 segment narrowing of the middle cerebral arteries. 4.  Aortic atherosclerosis (ICD10-I70.0). 5. Incompletely visualized right pleural effusion. Electronically Signed   By: Ulyses Jarred M.D.   On: 08/03/2018 21:30   Ct Cerebral Perfusion W Contrast  Result Date: 08/03/2018 CLINICAL DATA:  Left-sided deficits EXAM: CT ANGIOGRAPHY HEAD AND NECK CT PERFUSION BRAIN TECHNIQUE: Multidetector CT imaging of the head and neck was performed using the standard protocol during bolus administration of intravenous contrast. Multiplanar CT image reconstructions and MIPs were obtained to evaluate the vascular anatomy. Carotid stenosis measurements (when applicable) are obtained utilizing NASCET criteria, using the distal internal carotid diameter as the denominator. Multiphase CT imaging of the brain was performed following IV bolus contrast injection. Subsequent parametric perfusion maps were calculated using RAPID software. CONTRAST:  148mL ISOVUE-370 IOPAMIDOL (ISOVUE-370) INJECTION 76% COMPARISON:  None. FINDINGS: CTA NECK FINDINGS SKELETON: There is no bony spinal canal stenosis. No lytic or blastic lesion. OTHER NECK: Normal pharynx, larynx and major salivary glands. No cervical lymphadenopathy. Unremarkable thyroid gland. UPPER CHEST: Small right pleural effusion. AORTIC ARCH: There is mild calcific atherosclerosis of the aortic arch. There is no aneurysm, dissection or hemodynamically significant stenosis of the visualized ascending aorta and aortic arch. Conventional 3 vessel aortic branching pattern. The visualized proximal subclavian arteries are widely patent. RIGHT CAROTID SYSTEM: --Common carotid artery: Widely patent origin without common carotid artery dissection or aneurysm. --Internal carotid artery: No dissection,  occlusion or aneurysm. Mild atherosclerotic calcification at the carotid bifurcation without hemodynamically significant stenosis. --External carotid artery: No acute abnormality. LEFT CAROTID SYSTEM: --Common carotid artery: Widely patent origin without common carotid artery dissection or aneurysm. --Internal carotid artery: No dissection, occlusion or aneurysm. Mild atherosclerotic calcification at the carotid bifurcation without hemodynamically significant stenosis. --External carotid artery: No acute abnormality. VERTEBRAL ARTERIES: Left dominant configuration. Both origins  are normal. No dissection, occlusion or flow-limiting stenosis to the vertebrobasilar confluence. CTA HEAD FINDINGS ANTERIOR CIRCULATION: --Intracranial internal carotid arteries: Normal. --Anterior cerebral arteries: Normal. Absent right A1 segment, normal variant --Middle cerebral arteries: Mild narrowing of both M1 segments. --Posterior communicating arteries: Absent bilaterally. POSTERIOR CIRCULATION: --Basilar artery: Normal. --Posterior cerebral arteries: Normal. --Superior cerebellar arteries: Normal. --Inferior cerebellar arteries: Normal anterior and posterior inferior cerebellar arteries. VENOUS SINUSES: As permitted by contrast timing, patent. ANATOMIC VARIANTS: None DELAYED PHASE: No parenchymal contrast enhancement. Review of the MIP images confirms the above findings. CT Brain Perfusion Findings: CBF (<30%) Volume: 104mL Perfusion (Tmax>6.0s) volume: 66mL Mismatch Volume: 53mL Infarction Location:Posterior right MCA territory IMPRESSION: 1. No emergent large vessel occlusion. 2. 22 mL posterior right MCA territory area of ischemia by CT perfusion criteria. No demonstrated core infarct. 3. Mild bilateral M1 segment narrowing of the middle cerebral arteries. 4.  Aortic atherosclerosis (ICD10-I70.0). 5. Incompletely visualized right pleural effusion. Electronically Signed   By: Ulyses Jarred M.D.   On: 08/03/2018 21:30   Ct Head  Code Stroke Wo Contrast  Result Date: 08/03/2018 CLINICAL DATA:  Code stroke. 84 year female with slurred speech, leg weakness and numbness. Last known well 1545 hours. EXAM: CT HEAD WITHOUT CONTRAST TECHNIQUE: Contiguous axial images were obtained from the base of the skull through the vertex without intravenous contrast. COMPARISON:  Head CT without contrast 05/23/2018 and earlier. FINDINGS: Brain: Stable cerebral volume. Stable ventricle size and configuration. Heterogeneous hypodensity in the bilateral cerebral white matter, deep gray matter, and right cerebellum compatible with chronic small vessel disease appears stable since September. Small area of left occipital pole cortical encephalomalacia is stable. No midline shift, mass effect, evidence of mass lesion, intracranial hemorrhage or evidence of cortically based acute infarction. Vascular: Calcified atherosclerosis at the skull base. No suspicious intracranial vascular hyperdensity. Skull: No acute osseous abnormality identified. Sinuses/Orbits: Visualized paranasal sinuses and mastoids are stable and well pneumatized. Other: Stable orbit and scalp soft tissues. ASPECTS [facility] Stroke Program Early CT Score) - Ganglionic level infarction (caudate, lentiform nuclei, internal capsule, insula, M1-M3 cortex): 7 - Supraganglionic infarction (M4-M6 cortex): 3 Total score (0-10 with 10 being normal): 10 IMPRESSION: 1. No acute intracranial hemorrhage or acute cortically based infarct identified. ASPECTS is 10. 2. Chronic ischemic disease appears stable since September. 3. These results were communicated to Dr. Rory Percy at 5:06 pmon 11/12/2019by text page via the [facility] messaging system. Electronically Signed   By: Genevie Ann M.D.   On: 08/03/2018 17:07     Scheduled Meds: .  stroke: mapping our early stages of recovery book   Does not apply Once  . aspirin  325 mg Oral Daily  . furosemide  40 mg Oral Q T,Th,S,Su   And  . furosemide  20 mg Oral Q M,W,F  .  ipratropium-albuterol  3 mL Nebulization Q6H  . magnesium oxide  400 mg Oral QHS  . Melatonin  3 mg Oral QHS  . metoprolol tartrate  12.5 mg Oral BID  . montelukast  10 mg Oral QHS  . pantoprazole  40 mg Oral Daily  . pravastatin  40 mg Oral Daily  . traZODone  100 mg Oral QHS  . venlafaxine XR  225 mg Oral Q breakfast   Continuous Infusions:   LOS: 1 day   Time Spent in minutes   30 minutes  Kaydin Karbowski D.O. on 08/05/2018 at 12:18 PM  Between 7am to 7pm - Please see pager noted on [email or web address]  After 7pm  go to [email or web address]  And look for the night coverage person covering for me after hours  [facility] Office  [phone]

## 2018-08-05 NOTE — Progress Notes (Signed)
Physical Therapy Treatment Patient Details Name: Raven Gray MRN: [id number] DOB: [birth year] Today's Date: 08/05/2018    History of Present Illness Pt is an 84 y/o female admitted secondary to L sided weakness. Perfusion imaging revealed R MCA infarction. PMH includes COPD on 2L, a fib, dCHF, CVA, dementia, s/p pacemaker and gallbladder carcinoma.     PT Comments    Patient progressing this session able to achieve OOB to chair with +2 A.  Noted she fatigued quickly and required increased assist to make it safely to chair.  Has L inattention and decreased deficit awareness which increases fall risk and so she will benefit from STSNF level rehab upon d/c (confirmed with daughter this session as well.)   Follow Up Recommendations  SNF;Supervision/Assistance - 24 hour     Equipment Recommendations  None recommended by PT    Recommendations for Other Services       Precautions / Restrictions Precautions Precautions: Fall Precaution Comments: decreased L side awareness Restrictions Weight Bearing Restrictions: No    Mobility  Bed Mobility Overal bed mobility: Needs Assistance Bed Mobility: Supine to Sit     Supine to sit: Mod assist;HOB elevated;+2 for safety/equipment     General bed mobility comments: assist for L LE off bed and to lift trunk upright  Transfers Overall transfer level: Needs assistance Equipment used: Rolling walker (2 wheeled) Transfers: Sit to/from [facility] Sit to Stand: Mod assist;+2 physical assistance Stand pivot transfers: Mod assist;Max assist;+2 physical assistance       General transfer comment: cues for ant weight shift/momentum, assist for L UE placement on walker; used walker with assist and able to take steps, but L LE fatigue vs inattention began to buckle and needing increased assist to get to chair safely  Ambulation/Gait                 Stairs             Wheelchair Mobility    Modified Rankin  (Stroke Patients Only) Modified Rankin (Stroke Patients Only) Pre-Morbid Rankin Score: Moderately severe disability Modified Rankin: Severe disability     Balance Overall balance assessment: Needs assistance Sitting-balance support: Feet supported Sitting balance-Leahy Scale: Poor Sitting balance - Comments: close S at times with max verbal cues for preventing posterior LOB and L LOB, fatigued and required up to mod A for balance Postural control: Posterior lean;Left lateral lean   Standing balance-Leahy Scale: Poor Standing balance comment: UE support and assist in standing                            Cognition Arousal/Alertness: Awake/alert Behavior During Therapy: WFL for tasks assessed/performed Overall Cognitive Status: History of cognitive impairments - at baseline                                 General Comments: daughter reports worse with sundowning with fatigue, or later in day      Exercises      General Comments General comments (skin integrity, edema, etc.): L inattention and limited deficit awareness      Pertinent Vitals/Pain Pain Assessment: Faces Faces Pain Scale: Hurts even more Pain Location: back Pain Descriptors / Indicators: Aching;Tightness Pain Intervention(s): Monitored during session;Repositioned    Home Living  Prior Function            PT Goals (current goals can now be found in the care plan section) Progress towards PT goals: Progressing toward goals    Frequency    Min 3X/week      PT Plan Current plan remains appropriate    Co-evaluation              AM-PAC PT "6 Clicks" Daily Activity  Outcome Measure  Difficulty turning over in bed (including adjusting bedclothes, sheets and blankets)?: Unable Difficulty moving from lying on back to sitting on the side of the bed? : Unable Difficulty sitting down on and standing up from a chair with arms (e.g., wheelchair,  bedside commode, etc,.)?: Unable Help needed moving to and from a bed to chair (including a wheelchair)?: A Lot Help needed walking in hospital room?: Total Help needed climbing 3-5 steps with a railing? : Total 6 Click Score: 7    End of Session Equipment Utilized During Treatment: Gait belt;Oxygen Activity Tolerance: Patient limited by fatigue Patient left: in chair;with chair alarm set;with call bell/phone within reach;with family/visitor present Nurse Communication: Mobility status PT Visit Diagnosis: Hemiplegia and hemiparesis;Unsteadiness on feet (R26.81);Muscle weakness (generalized) (M62.81);History of falling (Z91.81);Repeated falls (R29.6) Hemiplegia - Right/Left: Left Hemiplegia - dominant/non-dominant: Non-dominant Hemiplegia - caused by: Cerebral infarction     Time: 1005-1040 PT Time Calculation (min) (ACUTE ONLY): 35 min  Charges:  $Therapeutic Activity: 8-22 mins                     Magda Kiel, [place] [facility] [phone] 08/05/2018    Reginia Naas 08/05/2018, 11:15 AM

## 2018-08-06 DIAGNOSIS — J9611 Chronic respiratory failure with hypoxia: Secondary | ICD-10-CM | POA: Diagnosis not present

## 2018-08-06 DIAGNOSIS — I5032 Chronic diastolic (congestive) heart failure: Secondary | ICD-10-CM | POA: Diagnosis not present

## 2018-08-06 DIAGNOSIS — H9193 Unspecified hearing loss, bilateral: Secondary | ICD-10-CM | POA: Diagnosis not present

## 2018-08-06 DIAGNOSIS — R600 Localized edema: Secondary | ICD-10-CM | POA: Diagnosis not present

## 2018-08-06 DIAGNOSIS — K5900 Constipation, unspecified: Secondary | ICD-10-CM | POA: Diagnosis not present

## 2018-08-06 DIAGNOSIS — I69354 Hemiplegia and hemiparesis following cerebral infarction affecting left non-dominant side: Secondary | ICD-10-CM | POA: Diagnosis not present

## 2018-08-06 DIAGNOSIS — I482 Chronic atrial fibrillation, unspecified: Secondary | ICD-10-CM | POA: Diagnosis not present

## 2018-08-06 DIAGNOSIS — J439 Emphysema, unspecified: Secondary | ICD-10-CM

## 2018-08-06 DIAGNOSIS — M545 Low back pain: Secondary | ICD-10-CM | POA: Diagnosis not present

## 2018-08-06 DIAGNOSIS — I480 Paroxysmal atrial fibrillation: Secondary | ICD-10-CM | POA: Diagnosis not present

## 2018-08-06 DIAGNOSIS — R400 Somnolence: Secondary | ICD-10-CM | POA: Diagnosis not present

## 2018-08-06 DIAGNOSIS — J449 Chronic obstructive pulmonary disease, unspecified: Secondary | ICD-10-CM | POA: Diagnosis not present

## 2018-08-06 DIAGNOSIS — G9341 Metabolic encephalopathy: Secondary | ICD-10-CM | POA: Diagnosis not present

## 2018-08-06 DIAGNOSIS — M25562 Pain in left knee: Secondary | ICD-10-CM | POA: Diagnosis not present

## 2018-08-06 DIAGNOSIS — G459 Transient cerebral ischemic attack, unspecified: Secondary | ICD-10-CM | POA: Diagnosis not present

## 2018-08-06 DIAGNOSIS — R279 Unspecified lack of coordination: Secondary | ICD-10-CM | POA: Diagnosis not present

## 2018-08-06 DIAGNOSIS — M170 Bilateral primary osteoarthritis of knee: Secondary | ICD-10-CM | POA: Diagnosis not present

## 2018-08-06 DIAGNOSIS — I10 Essential (primary) hypertension: Secondary | ICD-10-CM | POA: Diagnosis not present

## 2018-08-06 DIAGNOSIS — R404 Transient alteration of awareness: Secondary | ICD-10-CM | POA: Diagnosis not present

## 2018-08-06 DIAGNOSIS — Z743 Need for continuous supervision: Secondary | ICD-10-CM | POA: Diagnosis not present

## 2018-08-06 DIAGNOSIS — K5909 Other constipation: Secondary | ICD-10-CM | POA: Diagnosis not present

## 2018-08-06 DIAGNOSIS — M6281 Muscle weakness (generalized): Secondary | ICD-10-CM | POA: Diagnosis not present

## 2018-08-06 DIAGNOSIS — G8929 Other chronic pain: Secondary | ICD-10-CM | POA: Diagnosis not present

## 2018-08-06 DIAGNOSIS — I63411 Cerebral infarction due to embolism of right middle cerebral artery: Secondary | ICD-10-CM | POA: Diagnosis not present

## 2018-08-06 DIAGNOSIS — Z9981 Dependence on supplemental oxygen: Secondary | ICD-10-CM | POA: Diagnosis not present

## 2018-08-06 DIAGNOSIS — M79641 Pain in right hand: Secondary | ICD-10-CM | POA: Diagnosis not present

## 2018-08-06 DIAGNOSIS — R2681 Unsteadiness on feet: Secondary | ICD-10-CM | POA: Diagnosis not present

## 2018-08-06 DIAGNOSIS — R410 Disorientation, unspecified: Secondary | ICD-10-CM | POA: Diagnosis not present

## 2018-08-06 DIAGNOSIS — J410 Simple chronic bronchitis: Secondary | ICD-10-CM | POA: Diagnosis not present

## 2018-08-06 DIAGNOSIS — I63511 Cerebral infarction due to unspecified occlusion or stenosis of right middle cerebral artery: Secondary | ICD-10-CM | POA: Diagnosis not present

## 2018-08-06 DIAGNOSIS — J9601 Acute respiratory failure with hypoxia: Secondary | ICD-10-CM | POA: Diagnosis not present

## 2018-08-06 LAB — BASIC METABOLIC PANEL
Anion gap: 8 (ref 5–15)
BUN: 13 mg/dL (ref 8–23)
CALCIUM: 8.8 mg/dL — AB (ref 8.9–10.3)
CO2: 30 mmol/L (ref 22–32)
CREATININE: 1.16 mg/dL — AB (ref 0.44–1.00)
Chloride: 100 mmol/L (ref 98–111)
GFR calc Af Amer: 47 mL/min — ABNORMAL LOW (ref >60)
GFR calc non Af Amer: 41 mL/min — ABNORMAL LOW (ref >60)
GLUCOSE: 119 mg/dL — AB (ref 70–99)
Potassium: 3.2 mmol/L — ABNORMAL LOW (ref 3.5–5.1)
Sodium: 138 mmol/L (ref 135–145)

## 2018-08-06 LAB — CBC
HEMATOCRIT: 40.3 % (ref 36.0–46.0)
Hemoglobin: 12.9 g/dL (ref 12.0–15.0)
MCH: 28.7 pg (ref 26.0–34.0)
MCHC: 32 g/dL (ref 30.0–36.0)
MCV: 89.6 fL (ref 80.0–100.0)
Platelets: 141 10*3/uL — ABNORMAL LOW (ref 150–400)
RBC: 4.5 MIL/uL (ref 3.87–5.11)
RDW: 14.7 % (ref 11.5–15.5)
WBC: 6.5 10*3/uL (ref 4.0–10.5)
nRBC: 0 % (ref 0.0–0.2)

## 2018-08-06 LAB — PATHOLOGIST SMEAR REVIEW

## 2018-08-06 MED ORDER — ALPRAZOLAM 0.25 MG PO TABS: 0.2500 mg | ORAL_TABLET | Freq: Three times a day (TID) | ORAL | 0 refills | Status: AC | PRN

## 2018-08-06 MED ORDER — TRAMADOL HCL 50 MG PO TABS
50.0000 mg | ORAL_TABLET | Freq: Four times a day (QID) | ORAL | Status: DC | PRN
  Administered 2018-08-06: 50 mg via ORAL
  Filled 2018-08-06: qty 1

## 2018-08-06 MED ORDER — POTASSIUM CHLORIDE CRYS ER 20 MEQ PO TBCR
40.0000 meq | EXTENDED_RELEASE_TABLET | Freq: Once | ORAL | Status: AC
  Administered 2018-08-06: 40 meq via ORAL
  Filled 2018-08-06: qty 2

## 2018-08-06 MED ORDER — IPRATROPIUM-ALBUTEROL 0.5-2.5 (3) MG/3ML IN SOLN: 3.0000 mL | Freq: Four times a day (QID) | RESPIRATORY_TRACT | Status: DC | PRN

## 2018-08-06 MED ORDER — MORPHINE SULFATE (PF) 2 MG/ML IV SOLN
2.0000 mg | Freq: Once | INTRAVENOUS | Status: AC
  Administered 2018-08-06: 2 mg via INTRAVENOUS
  Filled 2018-08-06: qty 1

## 2018-08-06 MED ORDER — ASPIRIN 325 MG PO TABS: 325.0000 mg | ORAL_TABLET | Freq: Every day | ORAL | Status: AC

## 2018-08-06 NOTE — Clinical Social Work Note (Signed)
CSW facilitated patient discharge including contacting patient family and facility to confirm patient discharge plans. Clinical information faxed to facility and family agreeable with plan. CSW arranged ambulance transport via [facility] to [facility]. RN to call report prior to discharge [phone] Room 101A).  CSW will sign off for now as social work intervention is no longer needed. Please consult [place] again if new needs arise.  Dayton Scrape, [place]

## 2018-08-06 NOTE — Progress Notes (Addendum)
Pt was transferred to SNF accompanied by her daughter. Ativan and tramadol given for pain and anxiety, all personal belongings taken, IV removed and tele discontinued.

## 2018-08-06 NOTE — Progress Notes (Addendum)
PROGRESS NOTE    Raven Gray  VZC:[id number] DOB: 1928-04-10 DOA: 08/03/2018 PCP: Janie Morning, DO   Brief Narrative:  HPI on 08/03/2018 by Dr. Ivor Costa Raven Gray is a 84 y.o. female with medical history significant of hypertension, hyperlipidemia, COPD on 2 L nasal cannula oxygen, stroke, GERD, anxiety, tachycardia-bradycardia syndrome, pacemaker placement, gallbladder cancer, atrial fibrillation not on anticoagulant due to fall, CHF with, PE not on anticoagulants, who presents with left-sided weakness, numbness, slurred speech, confusion and dizziness.  Per patient's family, patient initially presents with transient slurred speech, leg weakness, leg numbness and dizziness.  Patient also had mild confusion. Symptoms started at about 15:45, then resolved by 16:00. While being evaluated in the ED, pt developed left-sided weakness and numbness in arm and leg at about 8:30 PM, which has been persistent.  Patient does not have facial droop, slurred speech, vision change or hearing loss.  Patient has mild dry cough and mild shortness of breath due to COPD, which are at baseline.  No chest pain, fever or chills.  Patient has nausea, no vomiting, diarrhea or abdominal pain pain no symptoms of UTI.  She had confusion earlier, which has resolved.  Currently patient is oriented x3.  Interim history Admitted for acute CVA. Workup up in progress. Pending SNF. Assessment & Plan   Acute CVA -CT head showed no acute stroke.  Small vessel disease. -CTA head and neck showed no large vessel occlusion.  Mild bilateral M1 narrowing.  Aortic atherosclerosis. -CT perfusion showed 22 mL right MCA ischemia.  No demonstrated for infarct. -MRI cannot be done due to pacemaker implant  -LDL 62, hemoglobin A1c 5.8 -Echocardiogram: EF 45 to 50%, no cardiac source of embolism identified. -Neurology consulted and appreciated, feels CVA secondary to atrial fibrillation however patient cannot be anticoagulated due to  frequent falls.  Recommended continuing antiplatelet therapy. -PT recommended SNF  Essential hypertension -Amlodipine currently held for permissive hypertension -Continue Metoprolol for control of atrial fibrillation -Continue Lasix for CHF management  Atrial fibrillation, chronic -CHADSVASC 7 -Currently not on anticoagulation due to risk of fall  -Continue metoprolol  Chronic systolic/diastolic heart failure -Patient appears to be compensated and euvolemic -BNP 635 on admission -Continue Lasix -Monitor intake and output, daily weights -Echocardiogram as above  Hyperlipidemia -Continue statin  Chronic hypoxic respiratory failure/COPD -Continue bronchodilators, currently stable  Primary gallbladder adenocarcinoma -Patient follows with oncology, Dr. Burr Medico -Status post cholecystectomy -CT abdomen did not show evidence of metastatic disease  GERD -Continue Protonix  Thrombocytopenia -Question if original lab value of platelets of 42 (suspect erroneous lab value) -Platelets currently 144 (on review of patient's chart, patient has not had a history of thrombocytopenia)  Chronic kidney disease, stage III -creatinine stable, continue to monitor BMP  Back pain -will place on tramadol PRN  Hypokalemia -will replace and monitor BMP  DVT Prophylaxis  SCDs  Code Status: DNR  Family Communication: none at bedside  Disposition Plan: Admitted. Pending SNF placement Consultants Neurology  Procedures  Echocardiogram   Antibiotics   Anti-infectives (From admission, onward)   None      Subjective:   Raven Gray seen and examined today.  Patient states has back pain and feels like it is broken in 2. Denies chest pain, shortness of breath, abdominal pain, nausea or vomiting, diarrhea or constipation.  Hoping to leave the hospital soon.    Objective:   Vitals:   08/06/18 0146 08/06/18 0412 08/06/18 0757 08/06/18 0824  BP:  106/67 (!) 150/76  Pulse:  67 65   Resp:  18  18   Temp:  98.5 F (36.9 C) 98.5 F (36.9 C)   TempSrc:  Oral Oral   SpO2: 97% 96% 96% 94%  Weight:      Height:        Intake/Output Summary (Last 24 hours) at 08/06/2018 1226 Last data filed at 08/06/2018 0900 Gross per 24 hour  Intake 240 ml  Output 1725 ml  Net -1485 ml   Filed Weights   08/03/18 1701  Weight: 71.8 kg   Exam  General: Well developed, well nourished, NAD, appears stated age  92: NCAT, mucous membranes moist.   Neck: Supple  Cardiovascular: S1 S2 auscultated, +SEM, irregular  Respiratory: Clear to auscultation bilaterally with equal chest rise  Abdomen: Soft, nontender, nondistended, + bowel sounds  Extremities: warm dry without cyanosis clubbing or edema  Neuro: AAOx3, decrease right sided strength  Psych: Appropriate mood and affect, pleasant   Data Reviewed: I have personally reviewed following labs and imaging studies  CBC: Recent Labs  Lab 08/03/18 1653 08/03/18 1741 08/05/18 0837 08/06/18 0419  WBC  --  5.2 5.7 6.5  NEUTROABS  --  2.9  --   --   HGB 15.3* 14.2 13.0 12.9  HCT 45.0 47.1* 40.9 40.3  MCV  --  93.6 89.3 89.6  PLT  --  42* 144* 831*   Basic Metabolic Panel: Recent Labs  Lab 08/03/18 1642 08/03/18 1653 08/06/18 0419  NA 140 140 138  K 3.9 3.9 3.2*  CL 101 98 100  CO2 32  --  30  GLUCOSE 104* 101* 119*  BUN 11 14 13   CREATININE 1.07* 1.00 1.16*  CALCIUM 8.9  --  8.8*   GFR: Estimated Creatinine Clearance: 31.9 mL/min (A) (by C-G formula based on SCr of 1.16 mg/dL (H)). Liver Function Tests: Recent Labs  Lab 08/03/18 1642  AST 28  ALT 13  ALKPHOS 84  BILITOT 0.9  PROT 6.7  ALBUMIN 3.4*   No results for input(s): LIPASE, AMYLASE in the last 168 hours. No results for input(s): AMMONIA in the last 168 hours. Coagulation Profile: Recent Labs  Lab 08/03/18 1642  INR 1.10   Cardiac Enzymes: No results for input(s): CKTOTAL, CKMB, CKMBINDEX, TROPONINI in the last 168 hours. BNP (last 3  results) No results for input(s): PROBNP in the last 8760 hours. HbA1C: Recent Labs    08/04/18 0258  HGBA1C 5.8*   CBG: Recent Labs  Lab 08/03/18 1648  GLUCAP 91   Lipid Profile: Recent Labs    08/04/18 0258  CHOL 121  HDL 42  LDLCALC 62  TRIG 86  CHOLHDL 2.9   Thyroid Function Tests: No results for input(s): TSH, T4TOTAL, FREET4, T3FREE, THYROIDAB in the last 72 hours. Anemia Panel: No results for input(s): VITAMINB12, FOLATE, FERRITIN, TIBC, IRON, RETICCTPCT in the last 72 hours. Urine analysis:    Component Value Date/Time   COLORURINE YELLOW 08/03/2018 1728   APPEARANCEUR CLEAR 08/03/2018 1728   LABSPEC 1.010 08/03/2018 1728   PHURINE 6.0 08/03/2018 1728   GLUCOSEU NEGATIVE 08/03/2018 1728   HGBUR NEGATIVE 08/03/2018 1728   BILIRUBINUR NEGATIVE 08/03/2018 1728   KETONESUR NEGATIVE 08/03/2018 1728   PROTEINUR NEGATIVE 08/03/2018 1728   UROBILINOGEN 1.0 10/20/2014 1614   NITRITE NEGATIVE 08/03/2018 1728   LEUKOCYTESUR NEGATIVE 08/03/2018 1728   Sepsis Labs: @LABRCNTIP (procalcitonin:4,lacticidven:4)  )No results found for this or any previous visit (from the past 240 hour(s)).    Radiology Studies:  Ct Head Wo Contrast  Result Date: 08/05/2018 CLINICAL DATA:  Follow-up examination for acute stroke. EXAM: CT HEAD WITHOUT CONTRAST TECHNIQUE: Contiguous axial images were obtained from the base of the skull through the vertex without intravenous contrast. COMPARISON:  Prior CT perfusion from 08/03/2018. FINDINGS: Brain: Few small areas of evolving patchy hypodensities seen within the right frontoparietal region, posterior right MCA territory, compatible with evolving ischemic infarcts. These closely approximate previously seen perfusion defect. Overall, areas of ischemia fairly small in size. No associated mass effect or hemorrhage. No other evidence for acute large vessel territory infarct. No intracranial hemorrhage. No mass lesion, midline shift or mass effect.  Diffuse ventricular prominence related global parenchymal volume loss of hydrocephalus. No extra-axial fluid collection. Vascular: No hyperdense vessel. Calcified atherosclerosis at the skull base. Skull: Scalp soft tissues and calvarium within normal limits. Sinuses/Orbits: Globes and orbital soft tissues within normal limits. Paranasal sinuses are largely clear. No mastoid effusion. Other: None. IMPRESSION: 1. Evolving small volume acute right posterior MCA territory infarcts, likely embolic in nature. No associated hemorrhage or mass effect. 2. No other new acute intracranial abnormality. 3. Underlying atrophy with advanced chronic microvascular ischemic disease, stable. Electronically Signed   By: Jeannine Boga M.D.   On: 08/05/2018 14:22     Scheduled Meds: .  stroke: mapping our early stages of recovery book   Does not apply Once  . aspirin  325 mg Oral Daily  . furosemide  40 mg Oral Q T,Th,S,Su   And  . furosemide  20 mg Oral Q M,W,F  . magnesium oxide  400 mg Oral QHS  . Melatonin  3 mg Oral QHS  . metoprolol tartrate  12.5 mg Oral BID  . montelukast  10 mg Oral QHS  . pantoprazole  40 mg Oral Daily  . pravastatin  40 mg Oral Daily  . traZODone  100 mg Oral QHS  . venlafaxine XR  225 mg Oral Q breakfast   Continuous Infusions:   LOS: 2 days   Time Spent in minutes   30 minutes  Kalen Ratajczak D.O. on 08/06/2018 at 12:26 PM  Between 7am to 7pm - Please see pager noted on [email or web address]  After 7pm go to [email or web address]  And look for the night coverage person covering for me after hours  [facility] Office  [phone]

## 2018-08-06 NOTE — Clinical Social Work Note (Addendum)
Insurance authorization still pending.  Dayton Scrape, CSW [phone]  1:22 pm Authorization approved. Sent message to MD to notify.  Dayton Scrape, [place]

## 2018-08-06 NOTE — Clinical Social Work Placement (Signed)
   CLINICAL SOCIAL WORK PLACEMENT  NOTE  Date:  08/06/2018  Patient Details  Name: Raven Gray MRN: [id number] Date of Birth: Dec 27, 1927  Clinical Social Work is seeking post-discharge placement for this patient at the   level of care (*CSW will initial, date and re-position this form in  chart as items are completed):      Patient/family provided with [facility] Work Department's list of facilities offering this level of care within the geographic area requested by the patient (or if unable, by the patient's family).      Patient/family informed of their freedom to choose among providers that offer the needed level of care, that participate in Medicare, Medicaid or managed care program needed by the patient, have an available bed and are willing to accept the patient.      Patient/family informed of [facility]'s ownership interest in [facility] and [facility], as well as of the fact that they are under no obligation to receive care at these facilities.  PASRR submitted to EDS on       PASRR number received on       Existing PASRR number confirmed on       FL2 transmitted to all facilities in geographic area requested by pt/family on       FL2 transmitted to all facilities within larger geographic area on       Patient informed that his/her managed care company has contracts with or will negotiate with certain facilities, including the following:            Patient/family informed of bed offers received.  Patient chooses bed at [place], [place]     Physician recommends and patient chooses bed at      Patient to be transferred to [facility] on 08/06/18.  Patient to be transferred to facility by [facility]     Patient family notified on 08/06/18 of transfer.  Name of family member notified:  Left voicemail for daughter, [name].     PHYSICIAN       Additional Comment:    _______________________________________________ Candie Chroman, LCSW 08/06/2018, 2:39 PM

## 2018-08-06 NOTE — Care Management Important Message (Signed)
Important Message  Patient Details  Name: Raven Gray MRN: [id number] Date of Birth: [birth year]   Medicare Important Message Given:  Yes    Janene Yousuf Montine Circle 08/06/2018, 3:28 PM

## 2018-08-06 NOTE — Discharge Summary (Signed)
Physician Discharge Summary  Raven Gray NTI:[id number] DOB: 07/08/1928 DOA: 08/03/2018  PCP: Janie Morning, DO  Admit date: 08/03/2018 Discharge date: 08/06/2018  Time spent: 45 minutes  Recommendations for Outpatient Follow-up:  Patient will be discharged to skilled nursing facility, continue physical and occupational therapy.  Patient will need to follow up with primary care provider within one week of discharge, repeat BMP.  Follow-up with neurology in 4 to 6 weeks.  Patient should continue medications as prescribed.  Patient should follow a heart healthy diet.   Discharge Diagnoses:  Acute CVA Essential hypertension Atrial fibrillation, chronic Chronic systolic/diastolic heart failure Hyperlipidemia Chronic hypoxic respiratory failure/COPD Primary gallbladder adenocarcinoma GERD Thrombocytopenia Chronic kidney disease, stage III Back pain Hypokalemia  Discharge Condition: stable  Diet recommendation: Heart healthy  Filed Weights   08/03/18 1701  Weight: 71.8 kg    History of present illness:  on 08/03/2018 by Dr. Williemae Natter a 84 y.o.femalewith medical history significant ofhypertension, hyperlipidemia, COPD on 2 L nasal cannula oxygen, stroke, GERD, anxiety, tachycardia-bradycardia syndrome, pacemaker placement, gallbladder cancer, atrial fibrillation not on anticoagulant due to fall, CHF with, PE not on anticoagulants, who presents with left-sided weakness, numbness, slurred speech, confusion and dizziness.  Per patient's family, patient initially presents with transient slurred speech, leg weakness, leg numbness and dizziness. Patient also hadmild confusion. Symptoms started at about 15:45, then resolved by16:00.While being evaluated in the ED, ptdeveloped left-sided weakness and numbness in arm and leg at about 8:30 PM,which has been persistent. Patient does not have facial droop, slurred speech, vision change or hearing loss. Patient has  milddry cough and mild shortness of breath due to COPD, which areat baseline. No chest pain, fever or chills. Patient has nausea, no vomiting, diarrhea or abdominal pain pain no symptoms of UTI. She had confusion earlier, which has resolved. Currently patient is oriented x3.  Hospital Course:   Acute CVA -CT head showed no acute stroke.  Small vessel disease. -CTA head and neck showed no large vessel occlusion.  Mild bilateral M1 narrowing.  Aortic atherosclerosis. -CT perfusion showed 22 mL right MCA ischemia.  No demonstrated for infarct. -MRI cannot be done due to pacemaker implant  -LDL 62, hemoglobin A1c 5.8 -Echocardiogram: EF 45 to 50%, no cardiac source of embolism identified. -Neurology consulted and appreciated, feels CVA secondary to atrial fibrillation however patient cannot be anticoagulated due to frequent falls.  Recommended continuing antiplatelet therapy. -Repeat CT head on 08/05/2018: Evolving small volume acute right posterior MCA territory infarcts, likely embolic in nature.  No associated hemorrhage or mass-effect. -PT recommended SNF -Continue aspirin and statin  Essential hypertension -Amlodipine currently held for permissive hypertension -Continue Metoprolol for control of atrial fibrillation -Continue Lasix for CHF management  Atrial fibrillation, chronic -CHADSVASC 7 -Currently not on anticoagulation due to risk of fall  -Continue metoprolol  Chronic systolic/diastolic heart failure -Patient appears to be compensated and euvolemic -BNP 635 on admission -Continue Lasix -Monitor intake and output, daily weights -Echocardiogram as above  Hyperlipidemia -Continue statin  Chronic hypoxic respiratory failure/COPD -Continue bronchodilators, currently stable  Primary gallbladder adenocarcinoma -Patient follows with oncology, Dr. Burr Medico -Status post cholecystectomy -CT abdomen did not show evidence of metastatic disease  GERD -Continue  Protonix  Thrombocytopenia -Question if original lab value of platelets of 42 (suspect erroneous lab value) -Platelets currently 144 (on review of patient's chart, patient has not had a history of thrombocytopenia)  Chronic kidney disease, stage III -creatinine stable  Back pain -continue therapy  Hypokalemia -  Replaced, repeat BMP in one week  Code status: DNR  Consultants Neurology  Procedures  Echocardiogram   Discharge Exam: Vitals:   08/06/18 1100 08/06/18 1211  BP:  (!) 103/57  Pulse: (P) 63 65  Resp:  18  Temp:  98.2 F (36.8 C)  SpO2: (P) 97% 97%   Patient has no complaints.  Denies chest pain, shortness breath, abdominal pain, nausea vomiting, diarrhea or constipation, dizziness or headache.   General: Well developed, well nourished, NAD, appears stated age  84: NCAT, mucous membranes moist.  Neck: Supple  Cardiovascular: S1 S2 auscultated, irregular, +SEM  Respiratory: Clear to auscultation bilaterally with equal chest rise  Abdomen: Soft, nontender, nondistended, + bowel sounds  Extremities: warm dry without cyanosis clubbing or edema  Neuro: AAOx3, decreased right-sided strength  Psych: Normal affect and demeanor with intact judgement and insight  Discharge Instructions Discharge Instructions    Discharge instructions   Complete by:  As directed    Patient will be discharged to skilled nursing facility, continue physical and occupational therapy.  Patient will need to follow up with primary care provider within one week of discharge, repeat BMP.  Follow-up with neurology in 4 to 6 weeks.  Patient should continue medications as prescribed.  Patient should follow a heart healthy diet.     Allergies as of 08/06/2018   No Known Allergies     Medication List    STOP taking these medications   amLODipine 2.5 MG tablet Commonly known as:  NORVASC     TAKE these medications   acetaminophen 325 MG tablet Commonly known as:   TYLENOL Take 650 mg by mouth every 6 (six) hours as needed (for pain).   ALPRAZolam 0.25 MG tablet Commonly known as:  XANAX Take 1 tablet (0.25 mg total) by mouth 3 (three) times daily as needed for anxiety (anxiety). What changed:  reasons to take this   aspirin 325 MG tablet Take 1 tablet (325 mg total) by mouth daily. Start taking on:  08/07/2018   furosemide 40 MG tablet Commonly known as:  LASIX Take 1 tablet (40 mg total) Tuesdays, Thursdays, Saturdays, and Sundays. Take 0.5 tablet (20 mg total) Mondays, Wednesdays, and Fridays. What changed:    how much to take  how to take this  when to take this  additional instructions   ipratropium-albuterol 0.5-2.5 (3) MG/3ML Soln Commonly known as:  DUONEB Take 3 mLs by nebulization every 6 (six) hours as needed (for wheezing or shortness of breath).   MAGNESIUM PO Take 1 tablet by mouth at bedtime.   Melatonin 5 MG Tabs Take 5 mg by mouth at bedtime.   metoprolol tartrate 25 MG tablet Commonly known as:  LOPRESSOR Take 0.5 tablets (12.5 mg total) by mouth 2 (two) times daily.   montelukast 10 MG tablet Commonly known as:  SINGULAIR Take 10 mg by mouth at bedtime.   multivitamin with minerals Tabs tablet Take 1 tablet by mouth daily.   nitroGLYCERIN 0.4 MG SL tablet Commonly known as:  NITROSTAT Place 0.4 mg under the tongue every 5 (five) minutes x 3 doses as needed for chest pain.   omeprazole 20 MG capsule Commonly known as:  PRILOSEC Take 20 mg by mouth daily as needed (for reflux/heartburn).   ondansetron 4 MG disintegrating tablet Commonly known as:  ZOFRAN-ODT Take 1 tablet (4 mg total) by mouth every 8 (eight) hours as needed for nausea.   OXYGEN Inhale 2 L into the lungs continuous.   polyethylene  glycol powder powder Commonly known as:  GLYCOLAX/MIRALAX Take 17 g by mouth 2 (two) times daily. Until daily soft stools OTC What changed:    when to take this  reasons to take this  additional  instructions   potassium chloride SA 20 MEQ tablet Commonly known as:  K-DUR,KLOR-CON Take 1 tablet (20 mEq total) by mouth daily as needed (along with lasix.). What changed:  when to take this   pravastatin 40 MG tablet Commonly known as:  PRAVACHOL Take 40 mg by mouth daily.   THERMACARE Misc Apply 1 patch topically See admin instructions. Apply 1 patch to the back daily as needed for pain (remove old one first)   traZODone 100 MG tablet Commonly known as:  DESYREL Take 100 mg by mouth at bedtime.   venlafaxine XR 150 MG 24 hr capsule Commonly known as:  EFFEXOR-XR Take 150 mg by mouth every morning.   venlafaxine XR 75 MG 24 hr capsule Commonly known as:  EFFEXOR-XR Take 75 mg by mouth every morning.      No Known Allergies  Contact information for follow-up providers    Janie Morning, DO. Schedule an appointment as soon as possible for a visit in 1 week(s).   Specialty:  Family Medicine Why:  Hospital follow up Contact information: [place] [address] [phone]        Lorretta Harp, MD .   Specialties:  Cardiology, Radiology Contact information: [address] [place] [address] [phone]        Garvin Fila, MD. Schedule an appointment as soon as possible for a visit in 6 week(s).   Specialties:  Neurology, Radiology Why:  Hosptial follow up Contact information: [address] [place] [address] [phone]            Contact information for after-discharge care    Destination    [facility] Preferred SNF .   Service:  Skilled Nursing Contact information: [place] [phone]                   The results of significant diagnostics from this hospitalization (including imaging, microbiology, ancillary and laboratory) are listed below for reference.    Significant Diagnostic Studies: Ct Angio  Head W Or Wo Contrast  Result Date: 08/03/2018 CLINICAL DATA:  Left-sided deficits EXAM: CT ANGIOGRAPHY HEAD AND NECK CT PERFUSION BRAIN TECHNIQUE: Multidetector CT imaging of the head and neck was performed using the standard protocol during bolus administration of intravenous contrast. Multiplanar CT image reconstructions and MIPs were obtained to evaluate the vascular anatomy. Carotid stenosis measurements (when applicable) are obtained utilizing NASCET criteria, using the distal internal carotid diameter as the denominator. Multiphase CT imaging of the brain was performed following IV bolus contrast injection. Subsequent parametric perfusion maps were calculated using RAPID software. CONTRAST:  154mL ISOVUE-370 IOPAMIDOL (ISOVUE-370) INJECTION 76% COMPARISON:  None. FINDINGS: CTA NECK FINDINGS SKELETON: There is no bony spinal canal stenosis. No lytic or blastic lesion. OTHER NECK: Normal pharynx, larynx and major salivary glands. No cervical lymphadenopathy. Unremarkable thyroid gland. UPPER CHEST: Small right pleural effusion. AORTIC ARCH: There is mild calcific atherosclerosis of the aortic arch. There is no aneurysm, dissection or hemodynamically significant stenosis of the visualized ascending aorta and aortic arch. Conventional 3 vessel aortic branching pattern. The visualized proximal subclavian arteries are widely patent. RIGHT CAROTID SYSTEM: --Common carotid artery: Widely patent origin without common  carotid artery dissection or aneurysm. --Internal carotid artery: No dissection, occlusion or aneurysm. Mild atherosclerotic calcification at the carotid bifurcation without hemodynamically significant stenosis. --External carotid artery: No acute abnormality. LEFT CAROTID SYSTEM: --Common carotid artery: Widely patent origin without common carotid artery dissection or aneurysm. --Internal carotid artery: No dissection, occlusion or aneurysm. Mild atherosclerotic calcification at the carotid  bifurcation without hemodynamically significant stenosis. --External carotid artery: No acute abnormality. VERTEBRAL ARTERIES: Left dominant configuration. Both origins are normal. No dissection, occlusion or flow-limiting stenosis to the vertebrobasilar confluence. CTA HEAD FINDINGS ANTERIOR CIRCULATION: --Intracranial internal carotid arteries: Normal. --Anterior cerebral arteries: Normal. Absent right A1 segment, normal variant --Middle cerebral arteries: Mild narrowing of both M1 segments. --Posterior communicating arteries: Absent bilaterally. POSTERIOR CIRCULATION: --Basilar artery: Normal. --Posterior cerebral arteries: Normal. --Superior cerebellar arteries: Normal. --Inferior cerebellar arteries: Normal anterior and posterior inferior cerebellar arteries. VENOUS SINUSES: As permitted by contrast timing, patent. ANATOMIC VARIANTS: None DELAYED PHASE: No parenchymal contrast enhancement. Review of the MIP images confirms the above findings. CT Brain Perfusion Findings: CBF (<30%) Volume: 50mL Perfusion (Tmax>6.0s) volume: 69mL Mismatch Volume: 63mL Infarction Location:Posterior right MCA territory IMPRESSION: 1. No emergent large vessel occlusion. 2. 22 mL posterior right MCA territory area of ischemia by CT perfusion criteria. No demonstrated core infarct. 3. Mild bilateral M1 segment narrowing of the middle cerebral arteries. 4.  Aortic atherosclerosis (ICD10-I70.0). 5. Incompletely visualized right pleural effusion. Electronically Signed   By: Ulyses Jarred M.D.   On: 08/03/2018 21:30   Dg Chest 2 View  Result Date: 08/03/2018 CLINICAL DATA:  Slurred speech EXAM: CHEST - 2 VIEW COMPARISON:  05/23/2018 FINDINGS: The heart is markedly enlarged. Vascular congestion. Diffuse interstitial edema. No pneumothorax. Hiatal hernia is unchanged. No pneumothorax. Single lead left subclavian pacemaker device is partially imaged. IMPRESSION: CHF with interstitial edema. Electronically Signed   By: Marybelle Killings M.D.    On: 08/03/2018 18:12   Ct Head Wo Contrast  Result Date: 08/05/2018 CLINICAL DATA:  Follow-up examination for acute stroke. EXAM: CT HEAD WITHOUT CONTRAST TECHNIQUE: Contiguous axial images were obtained from the base of the skull through the vertex without intravenous contrast. COMPARISON:  Prior CT perfusion from 08/03/2018. FINDINGS: Brain: Few small areas of evolving patchy hypodensities seen within the right frontoparietal region, posterior right MCA territory, compatible with evolving ischemic infarcts. These closely approximate previously seen perfusion defect. Overall, areas of ischemia fairly small in size. No associated mass effect or hemorrhage. No other evidence for acute large vessel territory infarct. No intracranial hemorrhage. No mass lesion, midline shift or mass effect. Diffuse ventricular prominence related global parenchymal volume loss of hydrocephalus. No extra-axial fluid collection. Vascular: No hyperdense vessel. Calcified atherosclerosis at the skull base. Skull: Scalp soft tissues and calvarium within normal limits. Sinuses/Orbits: Globes and orbital soft tissues within normal limits. Paranasal sinuses are largely clear. No mastoid effusion. Other: None. IMPRESSION: 1. Evolving small volume acute right posterior MCA territory infarcts, likely embolic in nature. No associated hemorrhage or mass effect. 2. No other new acute intracranial abnormality. 3. Underlying atrophy with advanced chronic microvascular ischemic disease, stable. Electronically Signed   By: Jeannine Boga M.D.   On: 08/05/2018 14:22   Ct Angio Neck W Or Wo Contrast  Result Date: 08/03/2018 CLINICAL DATA:  Left-sided deficits EXAM: CT ANGIOGRAPHY HEAD AND NECK CT PERFUSION BRAIN TECHNIQUE: Multidetector CT imaging of the head and neck was performed using the standard protocol during bolus administration of intravenous contrast. Multiplanar CT image reconstructions and MIPs were obtained to  evaluate the  vascular anatomy. Carotid stenosis measurements (when applicable) are obtained utilizing NASCET criteria, using the distal internal carotid diameter as the denominator. Multiphase CT imaging of the brain was performed following IV bolus contrast injection. Subsequent parametric perfusion maps were calculated using RAPID software. CONTRAST:  129mL ISOVUE-370 IOPAMIDOL (ISOVUE-370) INJECTION 76% COMPARISON:  None. FINDINGS: CTA NECK FINDINGS SKELETON: There is no bony spinal canal stenosis. No lytic or blastic lesion. OTHER NECK: Normal pharynx, larynx and major salivary glands. No cervical lymphadenopathy. Unremarkable thyroid gland. UPPER CHEST: Small right pleural effusion. AORTIC ARCH: There is mild calcific atherosclerosis of the aortic arch. There is no aneurysm, dissection or hemodynamically significant stenosis of the visualized ascending aorta and aortic arch. Conventional 3 vessel aortic branching pattern. The visualized proximal subclavian arteries are widely patent. RIGHT CAROTID SYSTEM: --Common carotid artery: Widely patent origin without common carotid artery dissection or aneurysm. --Internal carotid artery: No dissection, occlusion or aneurysm. Mild atherosclerotic calcification at the carotid bifurcation without hemodynamically significant stenosis. --External carotid artery: No acute abnormality. LEFT CAROTID SYSTEM: --Common carotid artery: Widely patent origin without common carotid artery dissection or aneurysm. --Internal carotid artery: No dissection, occlusion or aneurysm. Mild atherosclerotic calcification at the carotid bifurcation without hemodynamically significant stenosis. --External carotid artery: No acute abnormality. VERTEBRAL ARTERIES: Left dominant configuration. Both origins are normal. No dissection, occlusion or flow-limiting stenosis to the vertebrobasilar confluence. CTA HEAD FINDINGS ANTERIOR CIRCULATION: --Intracranial internal carotid arteries: Normal. --Anterior cerebral  arteries: Normal. Absent right A1 segment, normal variant --Middle cerebral arteries: Mild narrowing of both M1 segments. --Posterior communicating arteries: Absent bilaterally. POSTERIOR CIRCULATION: --Basilar artery: Normal. --Posterior cerebral arteries: Normal. --Superior cerebellar arteries: Normal. --Inferior cerebellar arteries: Normal anterior and posterior inferior cerebellar arteries. VENOUS SINUSES: As permitted by contrast timing, patent. ANATOMIC VARIANTS: None DELAYED PHASE: No parenchymal contrast enhancement. Review of the MIP images confirms the above findings. CT Brain Perfusion Findings: CBF (<30%) Volume: 57mL Perfusion (Tmax>6.0s) volume: 48mL Mismatch Volume: 30mL Infarction Location:Posterior right MCA territory IMPRESSION: 1. No emergent large vessel occlusion. 2. 22 mL posterior right MCA territory area of ischemia by CT perfusion criteria. No demonstrated core infarct. 3. Mild bilateral M1 segment narrowing of the middle cerebral arteries. 4.  Aortic atherosclerosis (ICD10-I70.0). 5. Incompletely visualized right pleural effusion. Electronically Signed   By: Ulyses Jarred M.D.   On: 08/03/2018 21:30   Ct Cerebral Perfusion W Contrast  Result Date: 08/03/2018 CLINICAL DATA:  Left-sided deficits EXAM: CT ANGIOGRAPHY HEAD AND NECK CT PERFUSION BRAIN TECHNIQUE: Multidetector CT imaging of the head and neck was performed using the standard protocol during bolus administration of intravenous contrast. Multiplanar CT image reconstructions and MIPs were obtained to evaluate the vascular anatomy. Carotid stenosis measurements (when applicable) are obtained utilizing NASCET criteria, using the distal internal carotid diameter as the denominator. Multiphase CT imaging of the brain was performed following IV bolus contrast injection. Subsequent parametric perfusion maps were calculated using RAPID software. CONTRAST:  137mL ISOVUE-370 IOPAMIDOL (ISOVUE-370) INJECTION 76% COMPARISON:  None.  FINDINGS: CTA NECK FINDINGS SKELETON: There is no bony spinal canal stenosis. No lytic or blastic lesion. OTHER NECK: Normal pharynx, larynx and major salivary glands. No cervical lymphadenopathy. Unremarkable thyroid gland. UPPER CHEST: Small right pleural effusion. AORTIC ARCH: There is mild calcific atherosclerosis of the aortic arch. There is no aneurysm, dissection or hemodynamically significant stenosis of the visualized ascending aorta and aortic arch. Conventional 3 vessel aortic branching pattern. The visualized proximal subclavian arteries are widely patent. RIGHT CAROTID SYSTEM: --Common carotid  artery: Widely patent origin without common carotid artery dissection or aneurysm. --Internal carotid artery: No dissection, occlusion or aneurysm. Mild atherosclerotic calcification at the carotid bifurcation without hemodynamically significant stenosis. --External carotid artery: No acute abnormality. LEFT CAROTID SYSTEM: --Common carotid artery: Widely patent origin without common carotid artery dissection or aneurysm. --Internal carotid artery: No dissection, occlusion or aneurysm. Mild atherosclerotic calcification at the carotid bifurcation without hemodynamically significant stenosis. --External carotid artery: No acute abnormality. VERTEBRAL ARTERIES: Left dominant configuration. Both origins are normal. No dissection, occlusion or flow-limiting stenosis to the vertebrobasilar confluence. CTA HEAD FINDINGS ANTERIOR CIRCULATION: --Intracranial internal carotid arteries: Normal. --Anterior cerebral arteries: Normal. Absent right A1 segment, normal variant --Middle cerebral arteries: Mild narrowing of both M1 segments. --Posterior communicating arteries: Absent bilaterally. POSTERIOR CIRCULATION: --Basilar artery: Normal. --Posterior cerebral arteries: Normal. --Superior cerebellar arteries: Normal. --Inferior cerebellar arteries: Normal anterior and posterior inferior cerebellar arteries. VENOUS SINUSES: As  permitted by contrast timing, patent. ANATOMIC VARIANTS: None DELAYED PHASE: No parenchymal contrast enhancement. Review of the MIP images confirms the above findings. CT Brain Perfusion Findings: CBF (<30%) Volume: 8mL Perfusion (Tmax>6.0s) volume: 4mL Mismatch Volume: 28mL Infarction Location:Posterior right MCA territory IMPRESSION: 1. No emergent large vessel occlusion. 2. 22 mL posterior right MCA territory area of ischemia by CT perfusion criteria. No demonstrated core infarct. 3. Mild bilateral M1 segment narrowing of the middle cerebral arteries. 4.  Aortic atherosclerosis (ICD10-I70.0). 5. Incompletely visualized right pleural effusion. Electronically Signed   By: Ulyses Jarred M.D.   On: 08/03/2018 21:30   Ct Head Code Stroke Wo Contrast  Result Date: 08/03/2018 CLINICAL DATA:  Code stroke. 84 year female with slurred speech, leg weakness and numbness. Last known well 1545 hours. EXAM: CT HEAD WITHOUT CONTRAST TECHNIQUE: Contiguous axial images were obtained from the base of the skull through the vertex without intravenous contrast. COMPARISON:  Head CT without contrast 05/23/2018 and earlier. FINDINGS: Brain: Stable cerebral volume. Stable ventricle size and configuration. Heterogeneous hypodensity in the bilateral cerebral white matter, deep gray matter, and right cerebellum compatible with chronic small vessel disease appears stable since September. Small area of left occipital pole cortical encephalomalacia is stable. No midline shift, mass effect, evidence of mass lesion, intracranial hemorrhage or evidence of cortically based acute infarction. Vascular: Calcified atherosclerosis at the skull base. No suspicious intracranial vascular hyperdensity. Skull: No acute osseous abnormality identified. Sinuses/Orbits: Visualized paranasal sinuses and mastoids are stable and well pneumatized. Other: Stable orbit and scalp soft tissues. ASPECTS [facility] Stroke Program Early CT Score) - Ganglionic level  infarction (caudate, lentiform nuclei, internal capsule, insula, M1-M3 cortex): 7 - Supraganglionic infarction (M4-M6 cortex): 3 Total score (0-10 with 10 being normal): 10 IMPRESSION: 1. No acute intracranial hemorrhage or acute cortically based infarct identified. ASPECTS is 10. 2. Chronic ischemic disease appears stable since September. 3. These results were communicated to Dr. Rory Percy at 5:06 pmon 11/12/2019by text page via the [facility] messaging system. Electronically Signed   By: Genevie Ann M.D.   On: 08/03/2018 17:07    Microbiology: No results found for this or any previous visit (from the past 240 hour(s)).   Labs: Basic Metabolic Panel: Recent Labs  Lab 08/03/18 1642 08/03/18 1653 08/06/18 0419  NA 140 140 138  K 3.9 3.9 3.2*  CL 101 98 100  CO2 32  --  30  GLUCOSE 104* 101* 119*  BUN 11 14 13   CREATININE 1.07* 1.00 1.16*  CALCIUM 8.9  --  8.8*   Liver Function Tests: Recent Labs  Lab 08/03/18 1642  AST 28  ALT 13  ALKPHOS 84  BILITOT 0.9  PROT 6.7  ALBUMIN 3.4*   No results for input(s): LIPASE, AMYLASE in the last 168 hours. No results for input(s): AMMONIA in the last 168 hours. CBC: Recent Labs  Lab 08/03/18 1653 08/03/18 1741 08/05/18 0837 08/06/18 0419  WBC  --  5.2 5.7 6.5  NEUTROABS  --  2.9  --   --   HGB 15.3* 14.2 13.0 12.9  HCT 45.0 47.1* 40.9 40.3  MCV  --  93.6 89.3 89.6  PLT  --  42* 144* 141*   Cardiac Enzymes: No results for input(s): CKTOTAL, CKMB, CKMBINDEX, TROPONINI in the last 168 hours. BNP: BNP (last 3 results) Recent Labs    11/06/17 0946 03/29/18 0131 08/03/18 1744  BNP 450.1* 811.5* 635.6*    ProBNP (last 3 results) No results for input(s): PROBNP in the last 8760 hours.  CBG: Recent Labs  Lab 08/03/18 1648  GLUCAP 91       Signed:  [facility] 08/06/2018, 1:37 PM

## 2018-08-08 DIAGNOSIS — J449 Chronic obstructive pulmonary disease, unspecified: Secondary | ICD-10-CM | POA: Diagnosis not present

## 2018-08-08 DIAGNOSIS — K5909 Other constipation: Secondary | ICD-10-CM | POA: Diagnosis not present

## 2018-08-08 DIAGNOSIS — M25562 Pain in left knee: Secondary | ICD-10-CM | POA: Diagnosis not present

## 2018-08-08 DIAGNOSIS — I480 Paroxysmal atrial fibrillation: Secondary | ICD-10-CM | POA: Diagnosis not present

## 2018-08-08 DIAGNOSIS — I69354 Hemiplegia and hemiparesis following cerebral infarction affecting left non-dominant side: Secondary | ICD-10-CM | POA: Diagnosis not present

## 2018-08-10 ENCOUNTER — Telehealth: Payer: Self-pay | Admitting: Cardiovascular Disease

## 2018-08-10 NOTE — Telephone Encounter (Signed)
Aretha Parrot called to let Dr Sallyanne Kuster know that [name] had a stroke on 08/03/18 and she is now in rehab. Dr C had wanted her to have blood work done this week since he had added a new medication at the last visit and her daughter wanted [place] to know she won't be bringing her for that.

## 2018-08-11 NOTE — Telephone Encounter (Signed)
FYI

## 2018-08-22 DIAGNOSIS — I63411 Cerebral infarction due to embolism of right middle cerebral artery: Secondary | ICD-10-CM | POA: Diagnosis not present

## 2018-08-22 DIAGNOSIS — R400 Somnolence: Secondary | ICD-10-CM | POA: Diagnosis not present

## 2018-08-22 DIAGNOSIS — R600 Localized edema: Secondary | ICD-10-CM | POA: Diagnosis not present

## 2018-08-26 ENCOUNTER — Other Ambulatory Visit: Payer: Self-pay | Admitting: *Deleted

## 2018-08-26 NOTE — Patient Outreach (Signed)
[facility]) Care Management  08/26/2018  Raven Gray 07/05/1928 [id number]   Attended interdisciplinary team meeting at [place] with [facility] UM team member Jari Pigg to discuss patient's progress and plan for transitioning to home. Spoke with discharge planner Ebony Hail to make sure she had received previous e-mail sent making her aware patient was active with [facility].  Ebony Hail confirmed she had received the message and planned to speak with the patient's daughter about the patient's participation in [facility].  Rutherford Limerick RN, BSN [facility] Acute Care Coordinator [phone]) Business Mobile [phone]) Toll free office

## 2018-08-31 ENCOUNTER — Ambulatory Visit: Payer: Medicare Other | Admitting: Cardiovascular Disease

## 2018-09-05 DIAGNOSIS — J449 Chronic obstructive pulmonary disease, unspecified: Secondary | ICD-10-CM | POA: Diagnosis not present

## 2018-09-05 DIAGNOSIS — R400 Somnolence: Secondary | ICD-10-CM | POA: Diagnosis not present

## 2018-09-05 DIAGNOSIS — I10 Essential (primary) hypertension: Secondary | ICD-10-CM | POA: Diagnosis not present

## 2018-09-08 ENCOUNTER — Other Ambulatory Visit: Payer: Self-pay | Admitting: *Deleted

## 2018-09-08 NOTE — Patient Outreach (Signed)
[facility] ([facility]) Care Management  09/08/2018  Raven Gray 07/06/1928 [id number]   Met with d/c planner Allison at [facility] prior to visiting this patient .  Allison made me aware this patient's discharge plan is to discharge to home on this Friday with Home Health through [facility]. Patient was previously being followed by [facility].  Placed a call and left a message making [facility] aware this patient's discharge plan.  Also made [facility] Hospital Liaison aware of the situation in case there were questions.    Patient is not appropriate for [facility] Care Management services at this time.     RN, BSN [facility]  Post Acute Care Coordinator ([phone]) Business Mobile ([phone]) Toll free office   

## 2018-09-10 ENCOUNTER — Other Ambulatory Visit: Payer: Self-pay | Admitting: *Deleted

## 2018-09-10 NOTE — Consult Note (Signed)
Spoke with Cheri with [facility] (outpatient palliative care program administered by [facility]). Cheri confirms that [facility] will follow Raven Gray post SNF discharge.  Marthenia Rolling, MSN-Ed, RN,BSN [facility] Liaison [phone]

## 2018-09-12 DIAGNOSIS — J9611 Chronic respiratory failure with hypoxia: Secondary | ICD-10-CM | POA: Diagnosis not present

## 2018-09-12 DIAGNOSIS — Z79891 Long term (current) use of opiate analgesic: Secondary | ICD-10-CM | POA: Diagnosis not present

## 2018-09-12 DIAGNOSIS — R7303 Prediabetes: Secondary | ICD-10-CM | POA: Diagnosis not present

## 2018-09-12 DIAGNOSIS — J439 Emphysema, unspecified: Secondary | ICD-10-CM | POA: Diagnosis not present

## 2018-09-12 DIAGNOSIS — Z9181 History of falling: Secondary | ICD-10-CM | POA: Diagnosis not present

## 2018-09-12 DIAGNOSIS — I351 Nonrheumatic aortic (valve) insufficiency: Secondary | ICD-10-CM | POA: Diagnosis not present

## 2018-09-12 DIAGNOSIS — I5032 Chronic diastolic (congestive) heart failure: Secondary | ICD-10-CM | POA: Diagnosis not present

## 2018-09-12 DIAGNOSIS — I700 Atherosclerosis of aorta: Secondary | ICD-10-CM | POA: Diagnosis not present

## 2018-09-12 DIAGNOSIS — Z950 Presence of cardiac pacemaker: Secondary | ICD-10-CM | POA: Diagnosis not present

## 2018-09-12 DIAGNOSIS — I482 Chronic atrial fibrillation, unspecified: Secondary | ICD-10-CM | POA: Diagnosis not present

## 2018-09-12 DIAGNOSIS — M1712 Unilateral primary osteoarthritis, left knee: Secondary | ICD-10-CM | POA: Diagnosis not present

## 2018-09-12 DIAGNOSIS — Z8673 Personal history of transient ischemic attack (TIA), and cerebral infarction without residual deficits: Secondary | ICD-10-CM | POA: Diagnosis not present

## 2018-09-12 DIAGNOSIS — K219 Gastro-esophageal reflux disease without esophagitis: Secondary | ICD-10-CM | POA: Diagnosis not present

## 2018-09-12 DIAGNOSIS — I130 Hypertensive heart and chronic kidney disease with heart failure and stage 1 through stage 4 chronic kidney disease, or unspecified chronic kidney disease: Secondary | ICD-10-CM | POA: Diagnosis not present

## 2018-09-12 DIAGNOSIS — N183 Chronic kidney disease, stage 3 (moderate): Secondary | ICD-10-CM | POA: Diagnosis not present

## 2018-09-12 DIAGNOSIS — I951 Orthostatic hypotension: Secondary | ICD-10-CM | POA: Diagnosis not present

## 2018-09-12 DIAGNOSIS — I495 Sick sinus syndrome: Secondary | ICD-10-CM | POA: Diagnosis not present

## 2018-09-12 DIAGNOSIS — E785 Hyperlipidemia, unspecified: Secondary | ICD-10-CM | POA: Diagnosis not present

## 2018-09-12 DIAGNOSIS — N200 Calculus of kidney: Secondary | ICD-10-CM | POA: Diagnosis not present

## 2018-09-12 LAB — CUP PACEART REMOTE DEVICE CHECK
Battery Remaining Longevity: 158 mo
Battery Voltage: 3.05 V
Implantable Lead Implant Date: 20180711
Lead Channel Impedance Value: 323 Ohm
Lead Channel Impedance Value: 418 Ohm
Lead Channel Pacing Threshold Amplitude: 0.625 V
Lead Channel Sensing Intrinsic Amplitude: 3.625 mV
Lead Channel Sensing Intrinsic Amplitude: 3.625 mV
Lead Channel Setting Pacing Pulse Width: 0.4 ms
Lead Channel Setting Sensing Sensitivity: 0.9 mV
MDC IDC LEAD LOCATION: 753860
MDC IDC MSMT LEADCHNL RV PACING THRESHOLD PULSEWIDTH: 0.4 ms
MDC IDC PG IMPLANT DT: 20180711
MDC IDC SESS DTM: 20191028155154
MDC IDC SET LEADCHNL RV PACING AMPLITUDE: 2 V
MDC IDC STAT BRADY RV PERCENT PACED: 83.64 %

## 2018-09-13 DIAGNOSIS — Z79891 Long term (current) use of opiate analgesic: Secondary | ICD-10-CM | POA: Diagnosis not present

## 2018-09-13 DIAGNOSIS — N183 Chronic kidney disease, stage 3 (moderate): Secondary | ICD-10-CM | POA: Diagnosis not present

## 2018-09-13 DIAGNOSIS — M1712 Unilateral primary osteoarthritis, left knee: Secondary | ICD-10-CM | POA: Diagnosis not present

## 2018-09-13 DIAGNOSIS — I951 Orthostatic hypotension: Secondary | ICD-10-CM | POA: Diagnosis not present

## 2018-09-13 DIAGNOSIS — Z9181 History of falling: Secondary | ICD-10-CM | POA: Diagnosis not present

## 2018-09-13 DIAGNOSIS — Z950 Presence of cardiac pacemaker: Secondary | ICD-10-CM | POA: Diagnosis not present

## 2018-09-13 DIAGNOSIS — Z8673 Personal history of transient ischemic attack (TIA), and cerebral infarction without residual deficits: Secondary | ICD-10-CM | POA: Diagnosis not present

## 2018-09-13 DIAGNOSIS — I700 Atherosclerosis of aorta: Secondary | ICD-10-CM | POA: Diagnosis not present

## 2018-09-13 DIAGNOSIS — J9611 Chronic respiratory failure with hypoxia: Secondary | ICD-10-CM | POA: Diagnosis not present

## 2018-09-13 DIAGNOSIS — I5032 Chronic diastolic (congestive) heart failure: Secondary | ICD-10-CM | POA: Diagnosis not present

## 2018-09-13 DIAGNOSIS — R7303 Prediabetes: Secondary | ICD-10-CM | POA: Diagnosis not present

## 2018-09-13 DIAGNOSIS — N200 Calculus of kidney: Secondary | ICD-10-CM | POA: Diagnosis not present

## 2018-09-13 DIAGNOSIS — I495 Sick sinus syndrome: Secondary | ICD-10-CM | POA: Diagnosis not present

## 2018-09-13 DIAGNOSIS — I482 Chronic atrial fibrillation, unspecified: Secondary | ICD-10-CM | POA: Diagnosis not present

## 2018-09-13 DIAGNOSIS — J439 Emphysema, unspecified: Secondary | ICD-10-CM | POA: Diagnosis not present

## 2018-09-13 DIAGNOSIS — I130 Hypertensive heart and chronic kidney disease with heart failure and stage 1 through stage 4 chronic kidney disease, or unspecified chronic kidney disease: Secondary | ICD-10-CM | POA: Diagnosis not present

## 2018-09-13 DIAGNOSIS — I351 Nonrheumatic aortic (valve) insufficiency: Secondary | ICD-10-CM | POA: Diagnosis not present

## 2018-09-13 DIAGNOSIS — K219 Gastro-esophageal reflux disease without esophagitis: Secondary | ICD-10-CM | POA: Diagnosis not present

## 2018-09-13 DIAGNOSIS — E785 Hyperlipidemia, unspecified: Secondary | ICD-10-CM | POA: Diagnosis not present

## 2018-09-16 DIAGNOSIS — J9611 Chronic respiratory failure with hypoxia: Secondary | ICD-10-CM | POA: Diagnosis not present

## 2018-09-16 DIAGNOSIS — Z8673 Personal history of transient ischemic attack (TIA), and cerebral infarction without residual deficits: Secondary | ICD-10-CM | POA: Diagnosis not present

## 2018-09-16 DIAGNOSIS — N200 Calculus of kidney: Secondary | ICD-10-CM | POA: Diagnosis not present

## 2018-09-16 DIAGNOSIS — Z79891 Long term (current) use of opiate analgesic: Secondary | ICD-10-CM | POA: Diagnosis not present

## 2018-09-16 DIAGNOSIS — I495 Sick sinus syndrome: Secondary | ICD-10-CM | POA: Diagnosis not present

## 2018-09-16 DIAGNOSIS — Z9181 History of falling: Secondary | ICD-10-CM | POA: Diagnosis not present

## 2018-09-16 DIAGNOSIS — I951 Orthostatic hypotension: Secondary | ICD-10-CM | POA: Diagnosis not present

## 2018-09-16 DIAGNOSIS — R7303 Prediabetes: Secondary | ICD-10-CM | POA: Diagnosis not present

## 2018-09-16 DIAGNOSIS — Z950 Presence of cardiac pacemaker: Secondary | ICD-10-CM | POA: Diagnosis not present

## 2018-09-16 DIAGNOSIS — E785 Hyperlipidemia, unspecified: Secondary | ICD-10-CM | POA: Diagnosis not present

## 2018-09-16 DIAGNOSIS — I351 Nonrheumatic aortic (valve) insufficiency: Secondary | ICD-10-CM | POA: Diagnosis not present

## 2018-09-16 DIAGNOSIS — I482 Chronic atrial fibrillation, unspecified: Secondary | ICD-10-CM | POA: Diagnosis not present

## 2018-09-16 DIAGNOSIS — I700 Atherosclerosis of aorta: Secondary | ICD-10-CM | POA: Diagnosis not present

## 2018-09-16 DIAGNOSIS — M1712 Unilateral primary osteoarthritis, left knee: Secondary | ICD-10-CM | POA: Diagnosis not present

## 2018-09-16 DIAGNOSIS — I130 Hypertensive heart and chronic kidney disease with heart failure and stage 1 through stage 4 chronic kidney disease, or unspecified chronic kidney disease: Secondary | ICD-10-CM | POA: Diagnosis not present

## 2018-09-16 DIAGNOSIS — J439 Emphysema, unspecified: Secondary | ICD-10-CM | POA: Diagnosis not present

## 2018-09-16 DIAGNOSIS — K219 Gastro-esophageal reflux disease without esophagitis: Secondary | ICD-10-CM | POA: Diagnosis not present

## 2018-09-16 DIAGNOSIS — I5032 Chronic diastolic (congestive) heart failure: Secondary | ICD-10-CM | POA: Diagnosis not present

## 2018-09-16 DIAGNOSIS — N183 Chronic kidney disease, stage 3 (moderate): Secondary | ICD-10-CM | POA: Diagnosis not present

## 2018-09-17 DIAGNOSIS — I482 Chronic atrial fibrillation, unspecified: Secondary | ICD-10-CM | POA: Diagnosis not present

## 2018-09-17 DIAGNOSIS — Z9181 History of falling: Secondary | ICD-10-CM | POA: Diagnosis not present

## 2018-09-17 DIAGNOSIS — N200 Calculus of kidney: Secondary | ICD-10-CM | POA: Diagnosis not present

## 2018-09-17 DIAGNOSIS — I495 Sick sinus syndrome: Secondary | ICD-10-CM | POA: Diagnosis not present

## 2018-09-17 DIAGNOSIS — I951 Orthostatic hypotension: Secondary | ICD-10-CM | POA: Diagnosis not present

## 2018-09-17 DIAGNOSIS — J439 Emphysema, unspecified: Secondary | ICD-10-CM | POA: Diagnosis not present

## 2018-09-17 DIAGNOSIS — I130 Hypertensive heart and chronic kidney disease with heart failure and stage 1 through stage 4 chronic kidney disease, or unspecified chronic kidney disease: Secondary | ICD-10-CM | POA: Diagnosis not present

## 2018-09-17 DIAGNOSIS — K219 Gastro-esophageal reflux disease without esophagitis: Secondary | ICD-10-CM | POA: Diagnosis not present

## 2018-09-17 DIAGNOSIS — E785 Hyperlipidemia, unspecified: Secondary | ICD-10-CM | POA: Diagnosis not present

## 2018-09-17 DIAGNOSIS — Z8673 Personal history of transient ischemic attack (TIA), and cerebral infarction without residual deficits: Secondary | ICD-10-CM | POA: Diagnosis not present

## 2018-09-17 DIAGNOSIS — Z950 Presence of cardiac pacemaker: Secondary | ICD-10-CM | POA: Diagnosis not present

## 2018-09-17 DIAGNOSIS — I700 Atherosclerosis of aorta: Secondary | ICD-10-CM | POA: Diagnosis not present

## 2018-09-17 DIAGNOSIS — Z79891 Long term (current) use of opiate analgesic: Secondary | ICD-10-CM | POA: Diagnosis not present

## 2018-09-17 DIAGNOSIS — J9611 Chronic respiratory failure with hypoxia: Secondary | ICD-10-CM | POA: Diagnosis not present

## 2018-09-17 DIAGNOSIS — N183 Chronic kidney disease, stage 3 (moderate): Secondary | ICD-10-CM | POA: Diagnosis not present

## 2018-09-17 DIAGNOSIS — I5032 Chronic diastolic (congestive) heart failure: Secondary | ICD-10-CM | POA: Diagnosis not present

## 2018-09-17 DIAGNOSIS — R7303 Prediabetes: Secondary | ICD-10-CM | POA: Diagnosis not present

## 2018-09-17 DIAGNOSIS — M1712 Unilateral primary osteoarthritis, left knee: Secondary | ICD-10-CM | POA: Diagnosis not present

## 2018-09-17 DIAGNOSIS — I351 Nonrheumatic aortic (valve) insufficiency: Secondary | ICD-10-CM | POA: Diagnosis not present

## 2018-09-20 DIAGNOSIS — Z950 Presence of cardiac pacemaker: Secondary | ICD-10-CM | POA: Diagnosis not present

## 2018-09-20 DIAGNOSIS — I951 Orthostatic hypotension: Secondary | ICD-10-CM | POA: Diagnosis not present

## 2018-09-20 DIAGNOSIS — E785 Hyperlipidemia, unspecified: Secondary | ICD-10-CM | POA: Diagnosis not present

## 2018-09-20 DIAGNOSIS — I5032 Chronic diastolic (congestive) heart failure: Secondary | ICD-10-CM | POA: Diagnosis not present

## 2018-09-20 DIAGNOSIS — I351 Nonrheumatic aortic (valve) insufficiency: Secondary | ICD-10-CM | POA: Diagnosis not present

## 2018-09-20 DIAGNOSIS — Z8673 Personal history of transient ischemic attack (TIA), and cerebral infarction without residual deficits: Secondary | ICD-10-CM | POA: Diagnosis not present

## 2018-09-20 DIAGNOSIS — I700 Atherosclerosis of aorta: Secondary | ICD-10-CM | POA: Diagnosis not present

## 2018-09-20 DIAGNOSIS — M1712 Unilateral primary osteoarthritis, left knee: Secondary | ICD-10-CM | POA: Diagnosis not present

## 2018-09-20 DIAGNOSIS — I482 Chronic atrial fibrillation, unspecified: Secondary | ICD-10-CM | POA: Diagnosis not present

## 2018-09-20 DIAGNOSIS — J9611 Chronic respiratory failure with hypoxia: Secondary | ICD-10-CM | POA: Diagnosis not present

## 2018-09-20 DIAGNOSIS — I130 Hypertensive heart and chronic kidney disease with heart failure and stage 1 through stage 4 chronic kidney disease, or unspecified chronic kidney disease: Secondary | ICD-10-CM | POA: Diagnosis not present

## 2018-09-20 DIAGNOSIS — N183 Chronic kidney disease, stage 3 (moderate): Secondary | ICD-10-CM | POA: Diagnosis not present

## 2018-09-20 DIAGNOSIS — R7303 Prediabetes: Secondary | ICD-10-CM | POA: Diagnosis not present

## 2018-09-20 DIAGNOSIS — N200 Calculus of kidney: Secondary | ICD-10-CM | POA: Diagnosis not present

## 2018-09-20 DIAGNOSIS — I495 Sick sinus syndrome: Secondary | ICD-10-CM | POA: Diagnosis not present

## 2018-09-20 DIAGNOSIS — Z9181 History of falling: Secondary | ICD-10-CM | POA: Diagnosis not present

## 2018-09-20 DIAGNOSIS — J439 Emphysema, unspecified: Secondary | ICD-10-CM | POA: Diagnosis not present

## 2018-09-20 DIAGNOSIS — Z79891 Long term (current) use of opiate analgesic: Secondary | ICD-10-CM | POA: Diagnosis not present

## 2018-09-20 DIAGNOSIS — K219 Gastro-esophageal reflux disease without esophagitis: Secondary | ICD-10-CM | POA: Diagnosis not present

## 2018-09-21 DIAGNOSIS — J9611 Chronic respiratory failure with hypoxia: Secondary | ICD-10-CM | POA: Diagnosis not present

## 2018-09-21 DIAGNOSIS — K219 Gastro-esophageal reflux disease without esophagitis: Secondary | ICD-10-CM | POA: Diagnosis not present

## 2018-09-21 DIAGNOSIS — E785 Hyperlipidemia, unspecified: Secondary | ICD-10-CM | POA: Diagnosis not present

## 2018-09-21 DIAGNOSIS — M1712 Unilateral primary osteoarthritis, left knee: Secondary | ICD-10-CM | POA: Diagnosis not present

## 2018-09-21 DIAGNOSIS — I700 Atherosclerosis of aorta: Secondary | ICD-10-CM | POA: Diagnosis not present

## 2018-09-21 DIAGNOSIS — I951 Orthostatic hypotension: Secondary | ICD-10-CM | POA: Diagnosis not present

## 2018-09-21 DIAGNOSIS — J439 Emphysema, unspecified: Secondary | ICD-10-CM | POA: Diagnosis not present

## 2018-09-21 DIAGNOSIS — Z950 Presence of cardiac pacemaker: Secondary | ICD-10-CM | POA: Diagnosis not present

## 2018-09-21 DIAGNOSIS — R7303 Prediabetes: Secondary | ICD-10-CM | POA: Diagnosis not present

## 2018-09-21 DIAGNOSIS — I351 Nonrheumatic aortic (valve) insufficiency: Secondary | ICD-10-CM | POA: Diagnosis not present

## 2018-09-21 DIAGNOSIS — N183 Chronic kidney disease, stage 3 (moderate): Secondary | ICD-10-CM | POA: Diagnosis not present

## 2018-09-21 DIAGNOSIS — I5032 Chronic diastolic (congestive) heart failure: Secondary | ICD-10-CM | POA: Diagnosis not present

## 2018-09-21 DIAGNOSIS — Z8673 Personal history of transient ischemic attack (TIA), and cerebral infarction without residual deficits: Secondary | ICD-10-CM | POA: Diagnosis not present

## 2018-09-21 DIAGNOSIS — I482 Chronic atrial fibrillation, unspecified: Secondary | ICD-10-CM | POA: Diagnosis not present

## 2018-09-21 DIAGNOSIS — I130 Hypertensive heart and chronic kidney disease with heart failure and stage 1 through stage 4 chronic kidney disease, or unspecified chronic kidney disease: Secondary | ICD-10-CM | POA: Diagnosis not present

## 2018-09-21 DIAGNOSIS — N200 Calculus of kidney: Secondary | ICD-10-CM | POA: Diagnosis not present

## 2018-09-21 DIAGNOSIS — Z79891 Long term (current) use of opiate analgesic: Secondary | ICD-10-CM | POA: Diagnosis not present

## 2018-09-21 DIAGNOSIS — I495 Sick sinus syndrome: Secondary | ICD-10-CM | POA: Diagnosis not present

## 2018-09-21 DIAGNOSIS — Z9181 History of falling: Secondary | ICD-10-CM | POA: Diagnosis not present

## 2018-09-23 DIAGNOSIS — J410 Simple chronic bronchitis: Secondary | ICD-10-CM | POA: Diagnosis not present

## 2018-09-28 ENCOUNTER — Telehealth: Payer: Self-pay | Admitting: Cardiovascular Disease

## 2018-09-28 NOTE — Telephone Encounter (Signed)
New message:   Patient nurse from Hospice concerning patient device. Please call nurse back.

## 2018-09-28 NOTE — Telephone Encounter (Signed)
Attempted to call number given for "Raven Gray" but a man and a woman at that number stated that I had the incorrect number.  Able to contact main number for Hospice and [place]. Spoke with Izora Gala, patient's nurse. She states patient's daughter wasn't sure if patient still needs to send remote transmissions for her PPM. Next remote is scheduled for 10/18/18. [name] that monitor should work automatically on that date as long as it is plugged in and near the patient. She will make the patient's daughter aware. If patient wishes to d/c monitoring at a later date, Izora Gala will call back. Direct number to DC given.

## 2018-10-05 ENCOUNTER — Ambulatory Visit: Payer: Medicare Other | Admitting: Neurology

## 2018-10-18 ENCOUNTER — Ambulatory Visit (INDEPENDENT_AMBULATORY_CARE_PROVIDER_SITE_OTHER): Payer: Medicare Other

## 2018-10-18 DIAGNOSIS — I130 Hypertensive heart and chronic kidney disease with heart failure and stage 1 through stage 4 chronic kidney disease, or unspecified chronic kidney disease: Secondary | ICD-10-CM | POA: Diagnosis not present

## 2018-10-18 DIAGNOSIS — I495 Sick sinus syndrome: Secondary | ICD-10-CM

## 2018-10-18 DIAGNOSIS — I5032 Chronic diastolic (congestive) heart failure: Secondary | ICD-10-CM | POA: Diagnosis not present

## 2018-10-18 DIAGNOSIS — I69354 Hemiplegia and hemiparesis following cerebral infarction affecting left non-dominant side: Secondary | ICD-10-CM | POA: Diagnosis not present

## 2018-10-18 DIAGNOSIS — N183 Chronic kidney disease, stage 3 (moderate): Secondary | ICD-10-CM | POA: Diagnosis not present

## 2018-10-19 NOTE — Progress Notes (Signed)
Remote pacemaker transmission.   

## 2018-10-21 LAB — CUP PACEART REMOTE DEVICE CHECK
Battery Remaining Longevity: 155 mo
Battery Voltage: 3.04 V
Implantable Lead Location: 753860
Implantable Lead Model: 5076
Implantable Pulse Generator Implant Date: 20180711
Lead Channel Impedance Value: 323 Ohm
Lead Channel Impedance Value: 418 Ohm
Lead Channel Pacing Threshold Amplitude: 0.625 V
Lead Channel Sensing Intrinsic Amplitude: 3.25 mV
Lead Channel Setting Pacing Pulse Width: 0.4 ms
Lead Channel Setting Sensing Sensitivity: 0.9 mV
MDC IDC LEAD IMPLANT DT: 20180711
MDC IDC MSMT LEADCHNL RV PACING THRESHOLD PULSEWIDTH: 0.4 ms
MDC IDC MSMT LEADCHNL RV SENSING INTR AMPL: 3.25 mV
MDC IDC SESS DTM: 20200127052018
MDC IDC SET LEADCHNL RV PACING AMPLITUDE: 2 V
MDC IDC STAT BRADY RV PERCENT PACED: 74.24 %

## 2018-10-22 LAB — CUP PACEART INCLINIC DEVICE CHECK
Date Time Interrogation Session: 20200131161612
Implantable Lead Location: 753860
Implantable Lead Model: 5076
MDC IDC LEAD IMPLANT DT: 20180711
MDC IDC PG IMPLANT DT: 20180711

## 2018-11-21 DEATH — deceased

## 2019-01-26 ENCOUNTER — Other Ambulatory Visit: Payer: Self-pay | Admitting: *Deleted

## 2019-01-26 NOTE — Patient Outreach (Signed)
[facility]) Care Management  01/26/2019  JAMAIA BRUM [birth year] [id number]  Late entry: [place] case closure/ documentation only   Patient designated not active with [facility] CM;  [facility] CM previous cases re-closed today as instructed  Oneta Rack, RN, BSN, [place] Coordinator [facility] Care Management  [phone]

## 2019-04-07 IMAGING — CR DG CHEST 2V
2 series · 2 of 2 positions shown · non-contrast
Comparison: 05/23/2018

CLINICAL DATA: Slurred speech

EXAM:
CHEST - 2 VIEW

[chest pa]
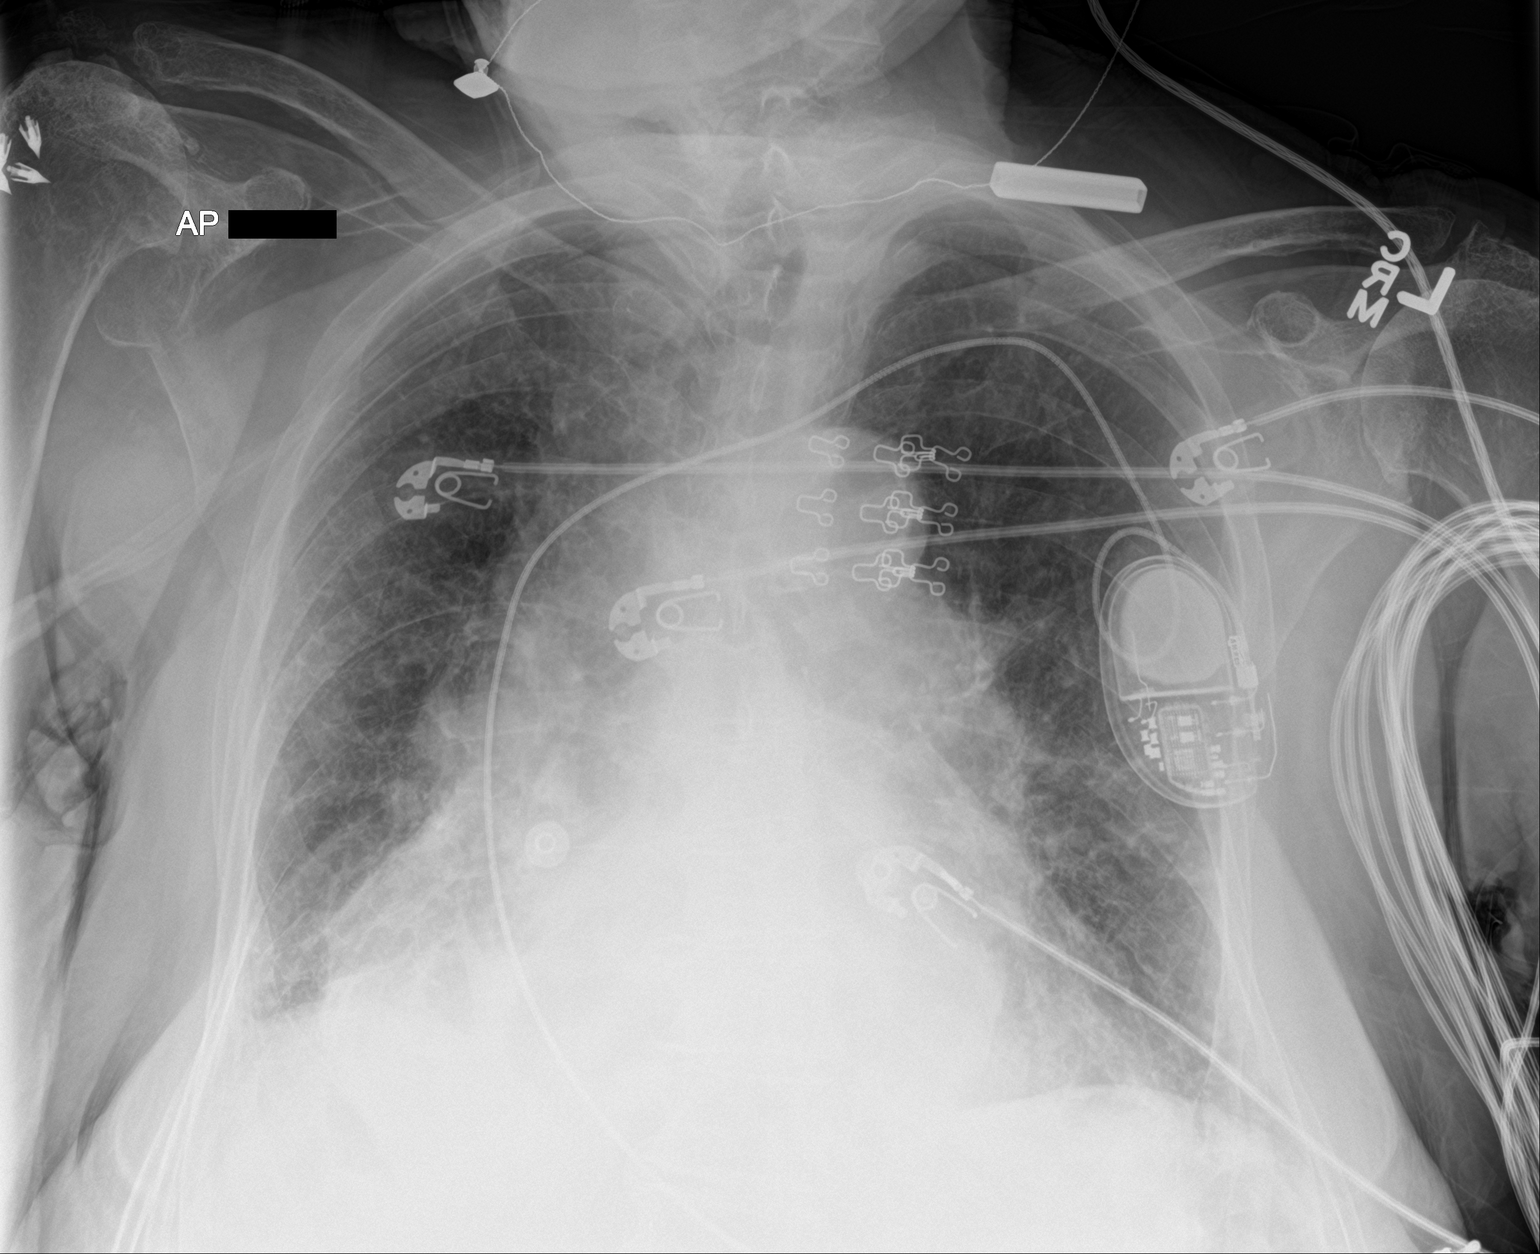

[chest lat]
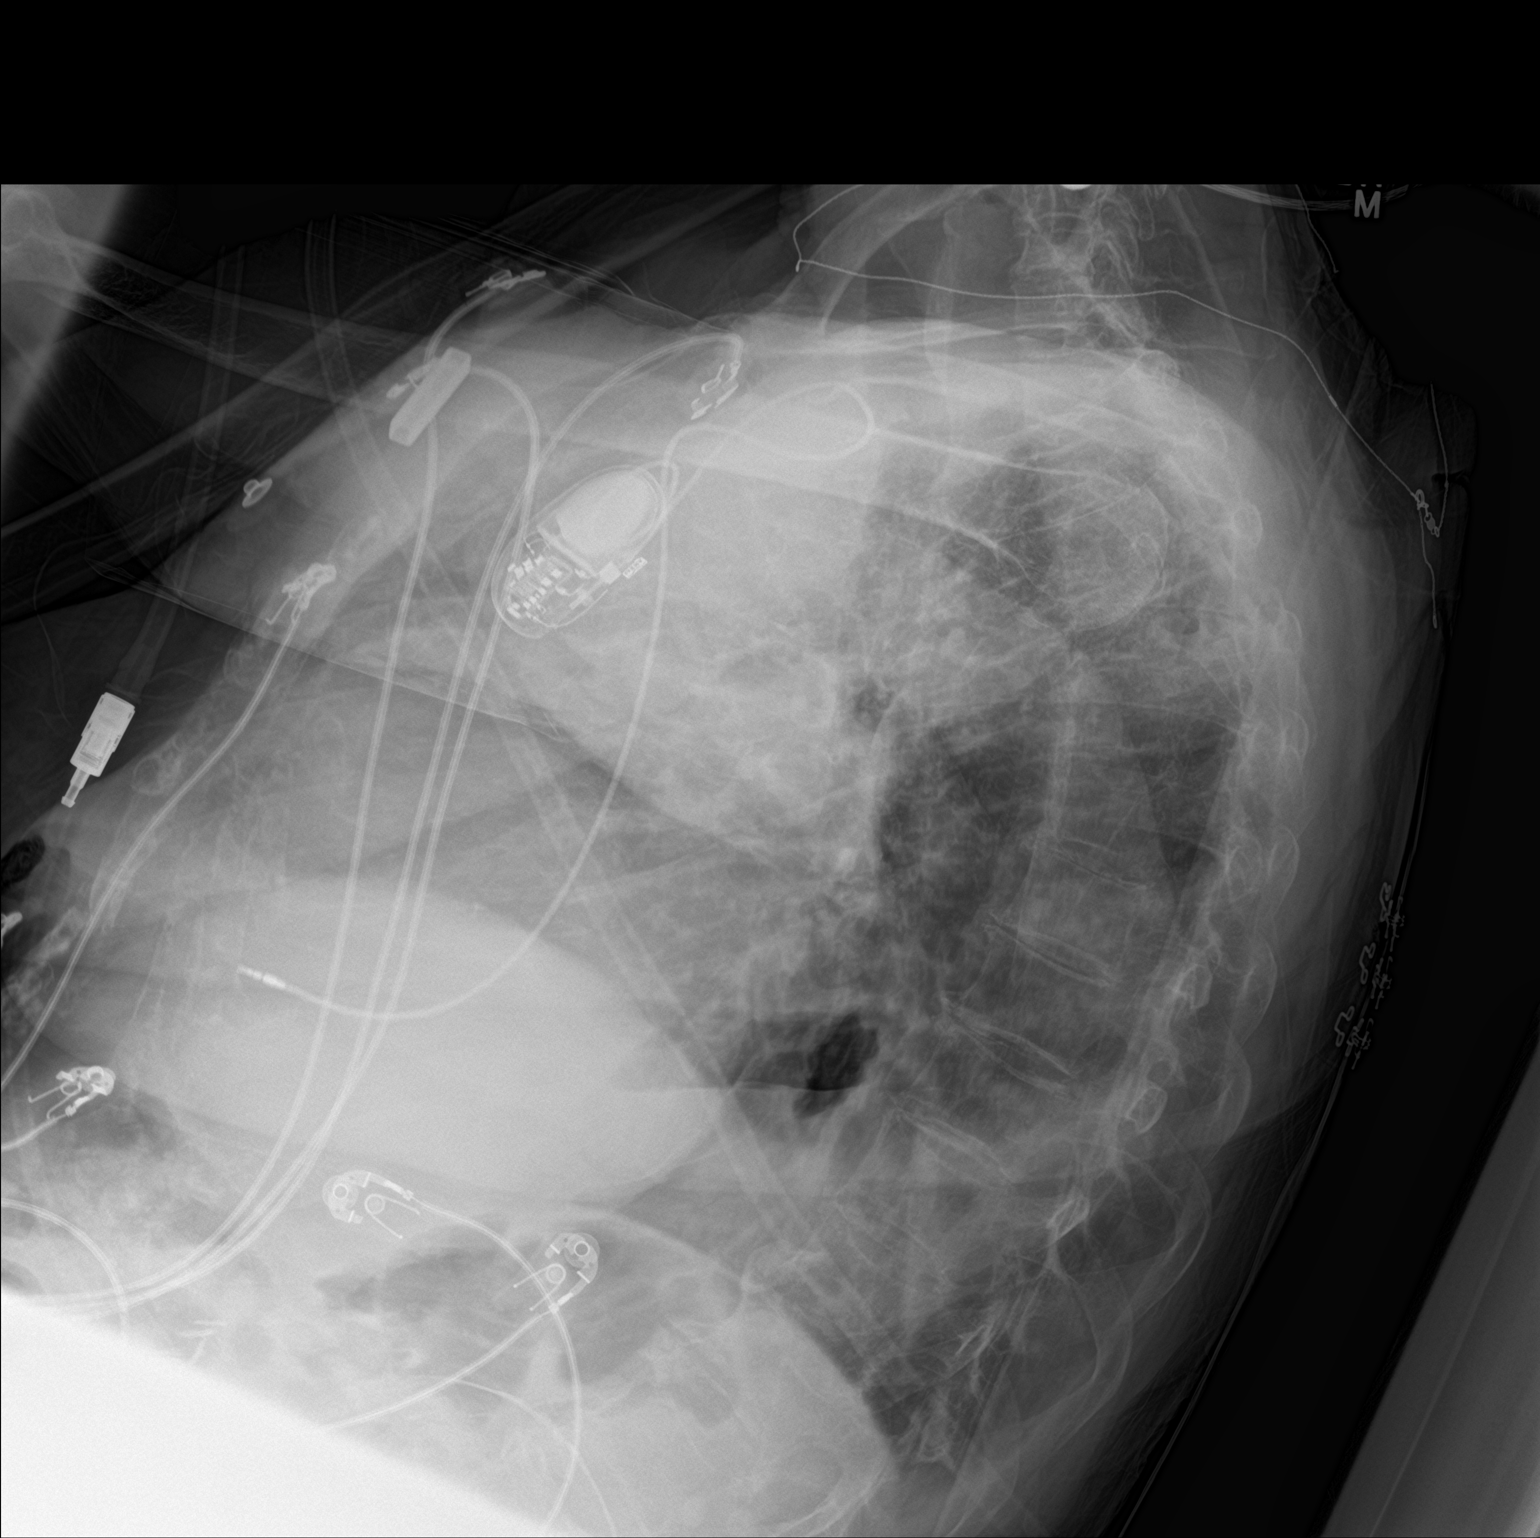

[2 of 2 positions shown; findings below may reference images not displayed]

FINDINGS: The heart is markedly enlarged. Vascular congestion. Diffuse
interstitial edema. No pneumothorax. Hiatal hernia is unchanged. No
pneumothorax. Single lead left subclavian pacemaker device is
partially imaged.
IMPRESSION: CHF with interstitial edema.

## 2020-08-09 IMAGING — CT CT ABD-PELV W/ CM
2 of 5 series · 15 of 46 positions shown, 17 images · IV contrast (omnipaque)
Comparison: CTA abdomen and pelvis March 29, 2018

CLINICAL DATA: Lower abdominal pain, nausea and vomiting for 1 day.
Status post cholecystectomy March 2018. History of hysterectomy.

EXAM:
CT ABDOMEN AND PELVIS WITH CONTRAST
TECHNIQUE: Multidetector CT imaging of the abdomen and pelvis was performed
using the standard protocol following bolus administration of
intravenous contrast.
CONTRAST:  75mL OMNIPAQUE IOHEXOL 300 MG/ML  SOLN

[Series 3: abd/ pelvis 5.0 i30f 2 · axial · 0.84mm/px · z∈[-618,-243]mm · 12 of 85 slices shown, 14 images]
[im 5/85  soft-tissue]
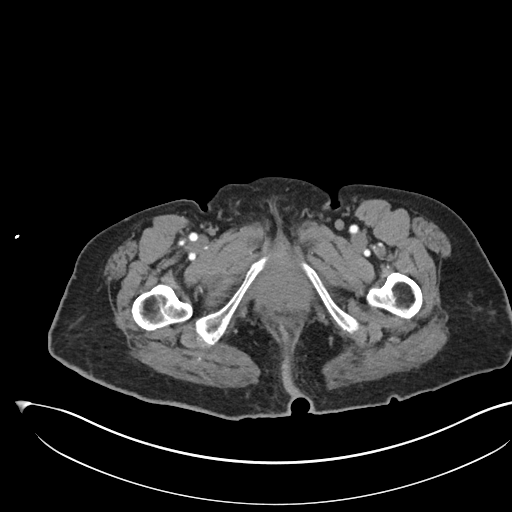
[im 5/85  bone]
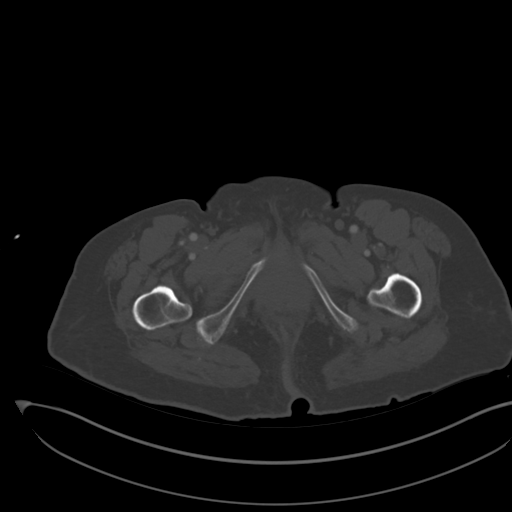
[im 13/85  soft-tissue]
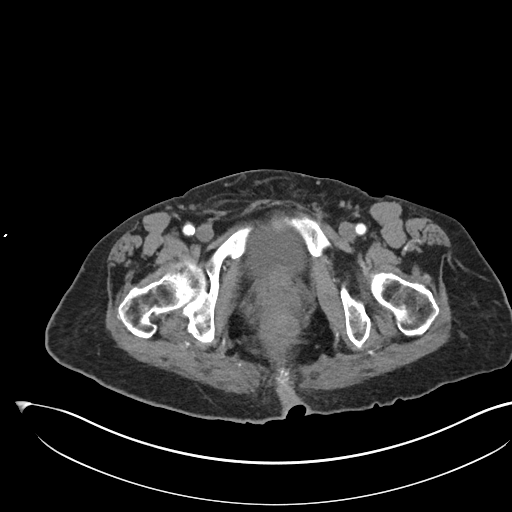
[im 17/85  soft-tissue]
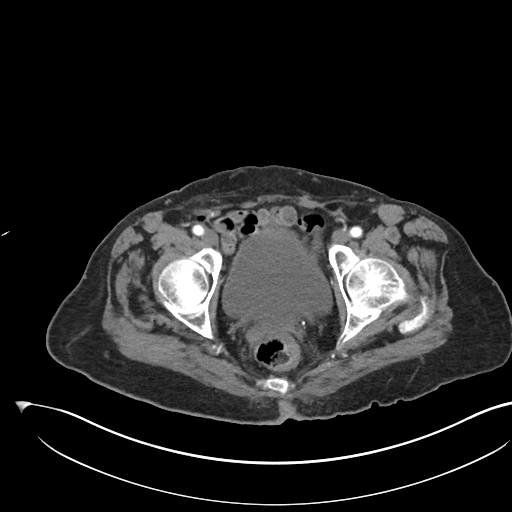
[im 26/85  soft-tissue]
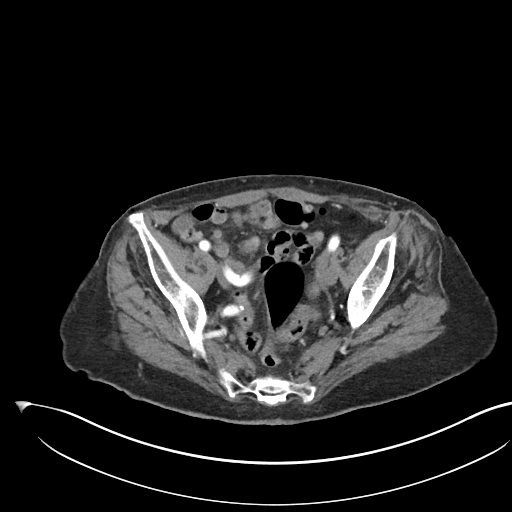
[im 34/85  soft-tissue]
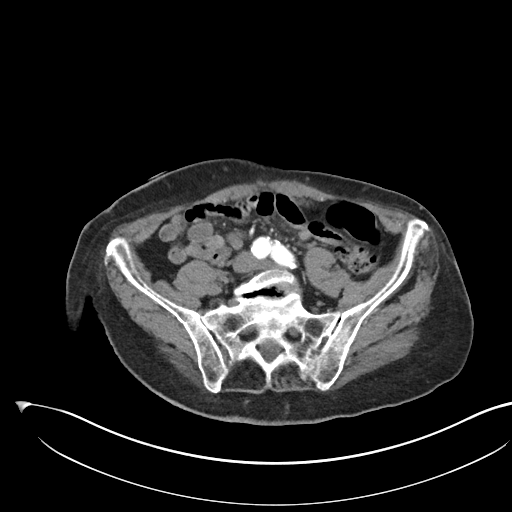
[im 38/85  soft-tissue]
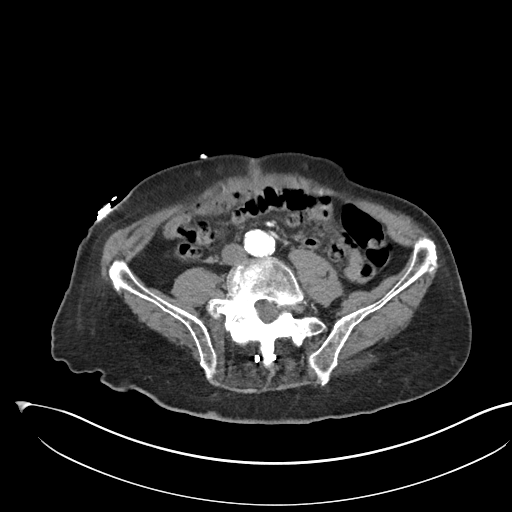
[im 47/85  soft-tissue]
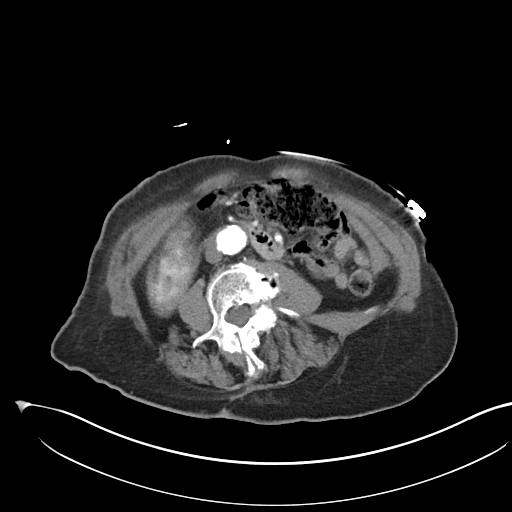
[im 51/85  soft-tissue]
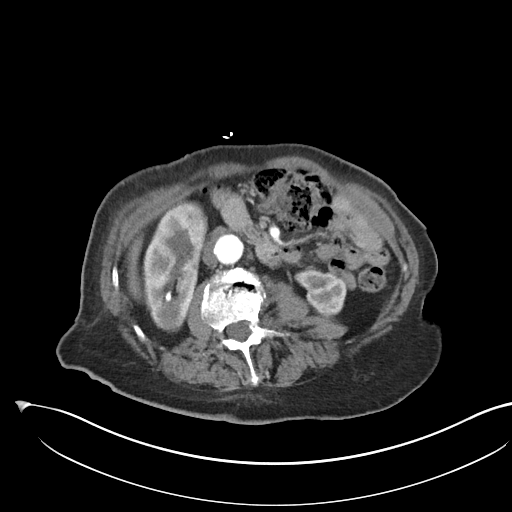
[im 59/85  soft-tissue]
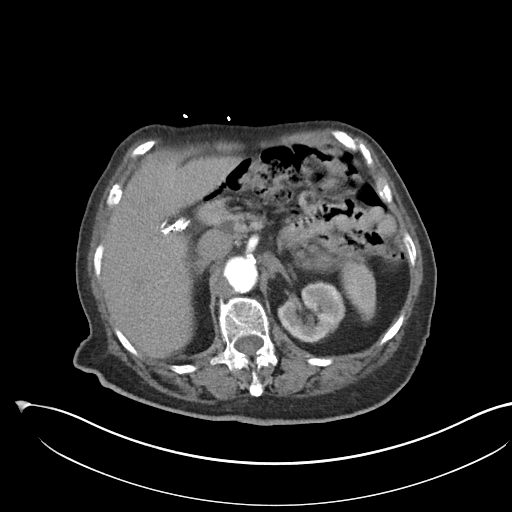
[im 59/85  bone]
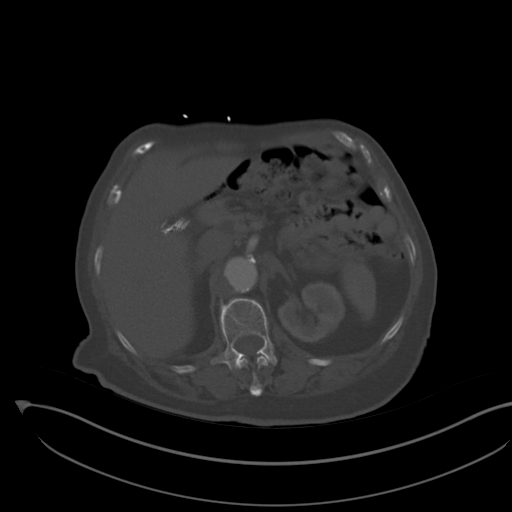
[im 68/85  soft-tissue]
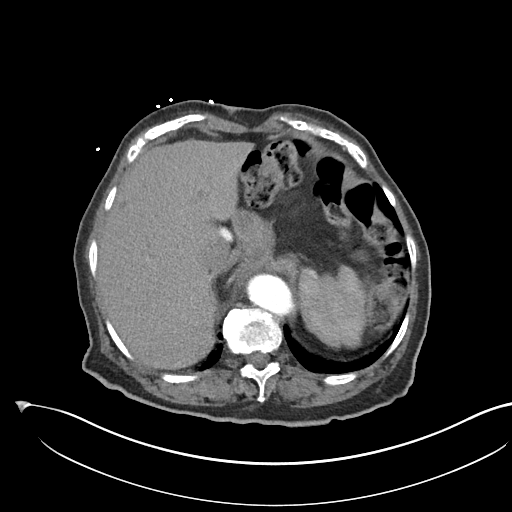
[im 72/85  soft-tissue]
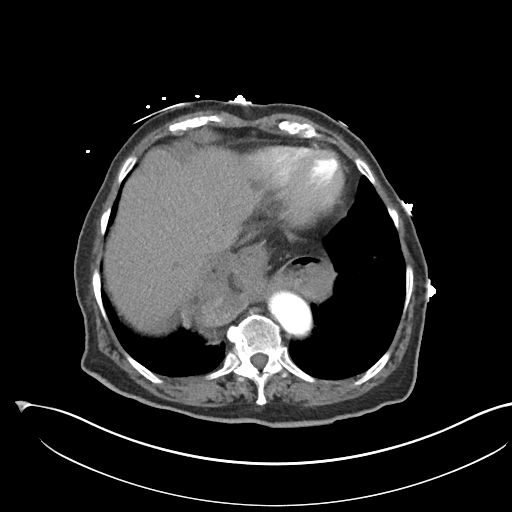
[im 80/85  soft-tissue]
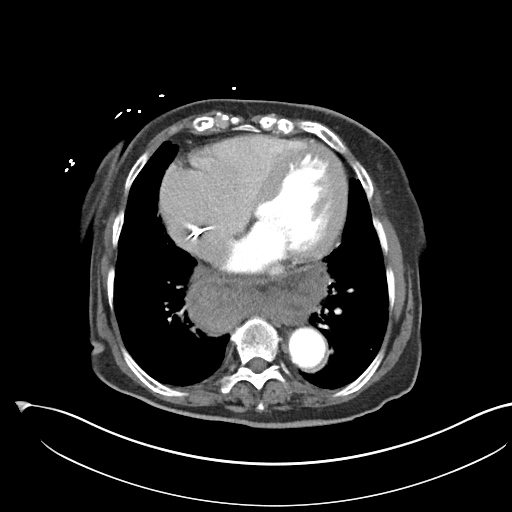

[Series 6: coronal soft tissue · coronal · 0.83mm/px · 3 of 99 slices shown]
[im 33/99  soft-tissue]
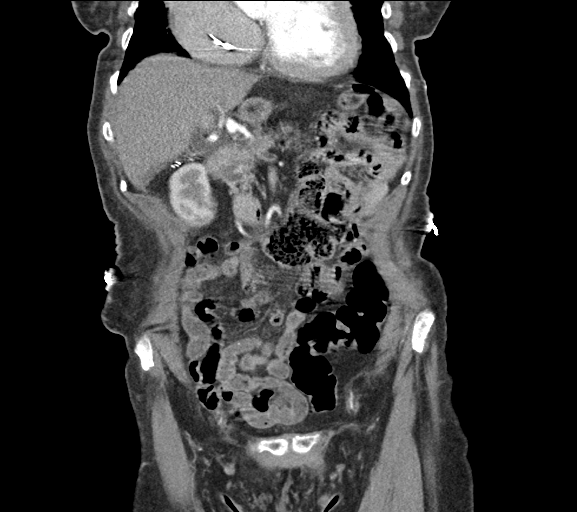
[im 44/99  soft-tissue]
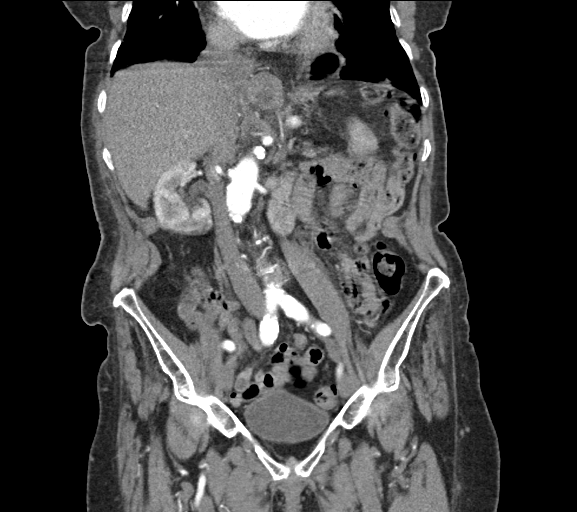
[im 55/99  soft-tissue]
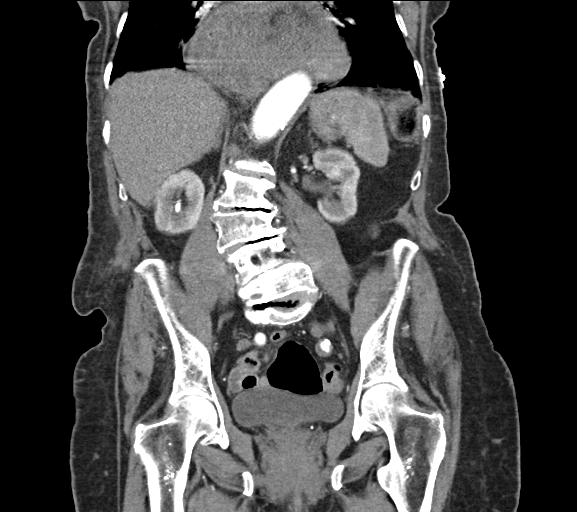

[15 of 46 positions shown; findings below may reference images not displayed]

FINDINGS: Mild motion degraded examination.

LOWER CHEST: Lung bases are clear. Stable cardiomegaly. No
pericardial effusion. Pacemaker wires in place. No pericardial
effusion.

HEPATOBILIARY: Subcentimeter hypodensities in the liver most
compatible with cysts. No intrahepatic biliary dilatation. Status
post cholecystectomy. No fluid collections within gallbladder fossa.

PANCREAS: Normal.

SPLEEN: Normal.

ADRENALS/URINARY TRACT: Kidneys are orthotopic, demonstrating
symmetric enhancement. Mildly atrophic LEFT kidney. Bilateral renal
scarring. 6 mm RIGHT interpolar nephrolithiasis. No hydronephrosis
or solid renal masses. 2.8 cm homogeneously hypodense
benign-appearing cyst RIGHT upper pole. 14 mm RIGHT lower pole cyst.
RIGHT parapelvic cysts. The unopacified ureters are normal in course
and caliber. Delayed imaging through the kidneys demonstrates
symmetric prompt contrast excretion within the proximal urinary
collecting system. Urinary bladder is partially distended and
unremarkable. Normal adrenal glands.

STOMACH/BOWEL: Large hiatal hernia. Small large bowel are normal in
caliber. Mobile cecum located in central abdomen with normal
appendix RIGHT upper quadrant. Moderate retained large bowel stool.
Mild diverticulosis.

VASCULAR/LYMPHATIC: Stable 2.6 cm ectatic aorta. Moderate calcific
atherosclerosis. No lymphadenopathy by CT size criteria.

REPRODUCTIVE: Status post hysterectomy.

OTHER: No intraperitoneal free fluid or free air.

MUSCULOSKELETAL: Nonacute. Thoracolumbar dextroscoliosis, L4-5
interspinous prosthesis. Severe RIGHT L5-S1 neural foraminal
narrowing. Mild old L2 compression fracture and severe degenerative
change of the lumbar spine.
IMPRESSION: 1. No acute intra-abdominal or pelvic process on this motion
degraded examination. Moderate retained large bowel stool.
2. 6 mm non-obstructing RIGHT nephrolithiasis. Mildly atrophic LEFT
kidney.
3. Status post cholecystectomy without complication. Status post
hysterectomy.
4. Stable 2.6 cm ectatic infrarenal aorta at risk for aneurysm
development. Recommend followup by ultrasound in 5 years. This
recommendation follows ACR consensus guidelines: White Paper of the
ACR Incidental Findings Committee II on Vascular Findings. [HOSPITAL] 8370; [DATE].

Aortic Atherosclerosis ([id number]-N4A.A).
